# Patient Record
Sex: Male | Born: 1940 | Race: White | Hispanic: No | Marital: Married | Smoking: Never smoker
Health system: Southern US, Community
[De-identification: ages and names within clinical notes are randomized; demographics above are authoritative.]

## PROBLEM LIST (undated history)

## (undated) DIAGNOSIS — K429 Umbilical hernia without obstruction or gangrene: Secondary | ICD-10-CM

## (undated) DIAGNOSIS — G4733 Obstructive sleep apnea (adult) (pediatric): Secondary | ICD-10-CM

## (undated) DIAGNOSIS — D7589 Other specified diseases of blood and blood-forming organs: Secondary | ICD-10-CM

## (undated) DIAGNOSIS — K219 Gastro-esophageal reflux disease without esophagitis: Secondary | ICD-10-CM

## (undated) DIAGNOSIS — K56600 Partial intestinal obstruction, unspecified as to cause: Secondary | ICD-10-CM

## (undated) DIAGNOSIS — G4730 Sleep apnea, unspecified: Secondary | ICD-10-CM

## (undated) DIAGNOSIS — K449 Diaphragmatic hernia without obstruction or gangrene: Secondary | ICD-10-CM

## (undated) DIAGNOSIS — I2510 Atherosclerotic heart disease of native coronary artery without angina pectoris: Secondary | ICD-10-CM

## (undated) DIAGNOSIS — K760 Fatty (change of) liver, not elsewhere classified: Secondary | ICD-10-CM

## (undated) DIAGNOSIS — Z9989 Dependence on other enabling machines and devices: Secondary | ICD-10-CM

## (undated) DIAGNOSIS — E7800 Pure hypercholesterolemia, unspecified: Secondary | ICD-10-CM

## (undated) DIAGNOSIS — K8020 Calculus of gallbladder without cholecystitis without obstruction: Secondary | ICD-10-CM

## (undated) DIAGNOSIS — C4490 Unspecified malignant neoplasm of skin, unspecified: Secondary | ICD-10-CM

## (undated) DIAGNOSIS — I10 Essential (primary) hypertension: Secondary | ICD-10-CM

## (undated) DIAGNOSIS — J309 Allergic rhinitis, unspecified: Secondary | ICD-10-CM

## (undated) DIAGNOSIS — N401 Enlarged prostate with lower urinary tract symptoms: Secondary | ICD-10-CM

## (undated) HISTORY — PX: EYE SURGERY: SHX253

## (undated) HISTORY — DX: Obstructive sleep apnea (adult) (pediatric): G47.33

## (undated) HISTORY — PX: OTHER SURGICAL HISTORY: SHX169

## (undated) HISTORY — DX: Benign prostatic hyperplasia with lower urinary tract symptoms: N40.1

## (undated) HISTORY — DX: Dependence on other enabling machines and devices: Z99.89

## (undated) HISTORY — PX: ROTATOR CUFF REPAIR: SHX139

## (undated) HISTORY — DX: Fatty (change of) liver, not elsewhere classified: K76.0

## (undated) HISTORY — DX: Essential (primary) hypertension: I10

## (undated) HISTORY — DX: Other specified diseases of blood and blood-forming organs: D75.89

## (undated) HISTORY — DX: Partial intestinal obstruction, unspecified as to cause: K56.600

## (undated) HISTORY — DX: Diaphragmatic hernia without obstruction or gangrene: K44.9

## (undated) HISTORY — DX: Atherosclerotic heart disease of native coronary artery without angina pectoris: I25.10

## (undated) HISTORY — DX: Umbilical hernia without obstruction or gangrene: K42.9

## (undated) HISTORY — DX: Sleep apnea, unspecified: G47.30

## (undated) HISTORY — DX: Calculus of gallbladder without cholecystitis without obstruction: K80.20

## (undated) HISTORY — PX: TONSILLECTOMY: SUR1361

## (undated) HISTORY — DX: Gastro-esophageal reflux disease without esophagitis: K21.9

## (undated) HISTORY — DX: Allergic rhinitis, unspecified: J30.9

## (undated) HISTORY — DX: Unspecified malignant neoplasm of skin, unspecified: C44.90

---

## 1998-01-19 ENCOUNTER — Ambulatory Visit (HOSPITAL_COMMUNITY): Admission: RE | Admit: 1998-01-19 | Discharge: 1998-01-19 | Payer: Self-pay | Admitting: Family Medicine

## 1998-03-02 ENCOUNTER — Ambulatory Visit (HOSPITAL_COMMUNITY): Admission: RE | Admit: 1998-03-02 | Discharge: 1998-03-02 | Payer: Self-pay | Admitting: Family Medicine

## 1998-08-14 HISTORY — PX: CARDIAC CATHETERIZATION: SHX172

## 1999-10-05 ENCOUNTER — Encounter: Payer: Self-pay | Admitting: Orthopaedic Surgery

## 1999-10-05 ENCOUNTER — Encounter: Admission: RE | Admit: 1999-10-05 | Discharge: 1999-10-05 | Payer: Self-pay | Admitting: Orthopaedic Surgery

## 1999-12-19 ENCOUNTER — Encounter: Payer: Self-pay | Admitting: Family Medicine

## 1999-12-19 ENCOUNTER — Encounter: Admission: RE | Admit: 1999-12-19 | Discharge: 1999-12-19 | Payer: Self-pay | Admitting: Family Medicine

## 2004-02-09 ENCOUNTER — Encounter: Admission: RE | Admit: 2004-02-09 | Discharge: 2004-02-09 | Payer: Self-pay | Admitting: Family Medicine

## 2004-02-09 IMAGING — CR DG CHEST 2V
2 series · 2 of 2 positions shown · non-contrast
Comparison: none

CLINICAL DATA: Cough and congestion.
 CHEST ? TWO VIEWS
 The heart size and mediastinal contours are normal. The lungs are clear. The visualized skeleton is unremarkable.

 There is no interval change since [DATE].
 IMPRESSION
 Since [DATE], no interval change ? no active disease.

[view not recorded (1 of 2)]
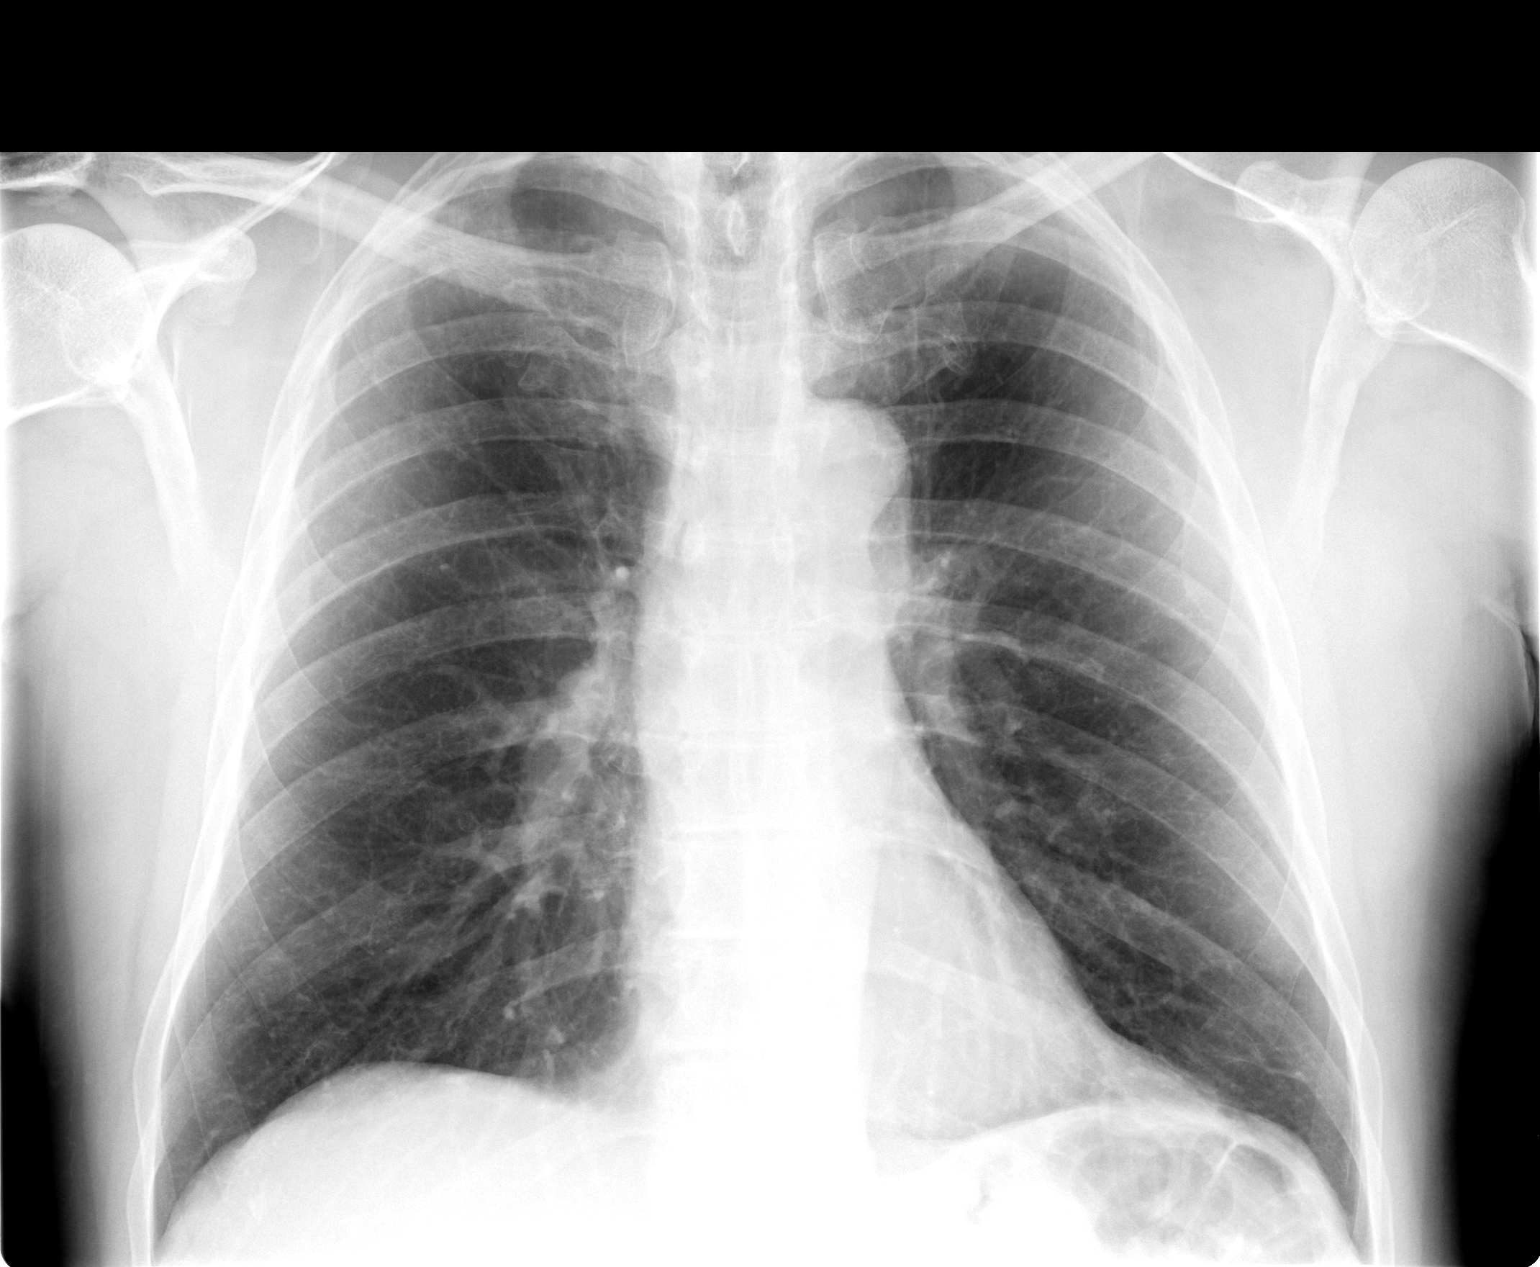

[view not recorded (2 of 2)]
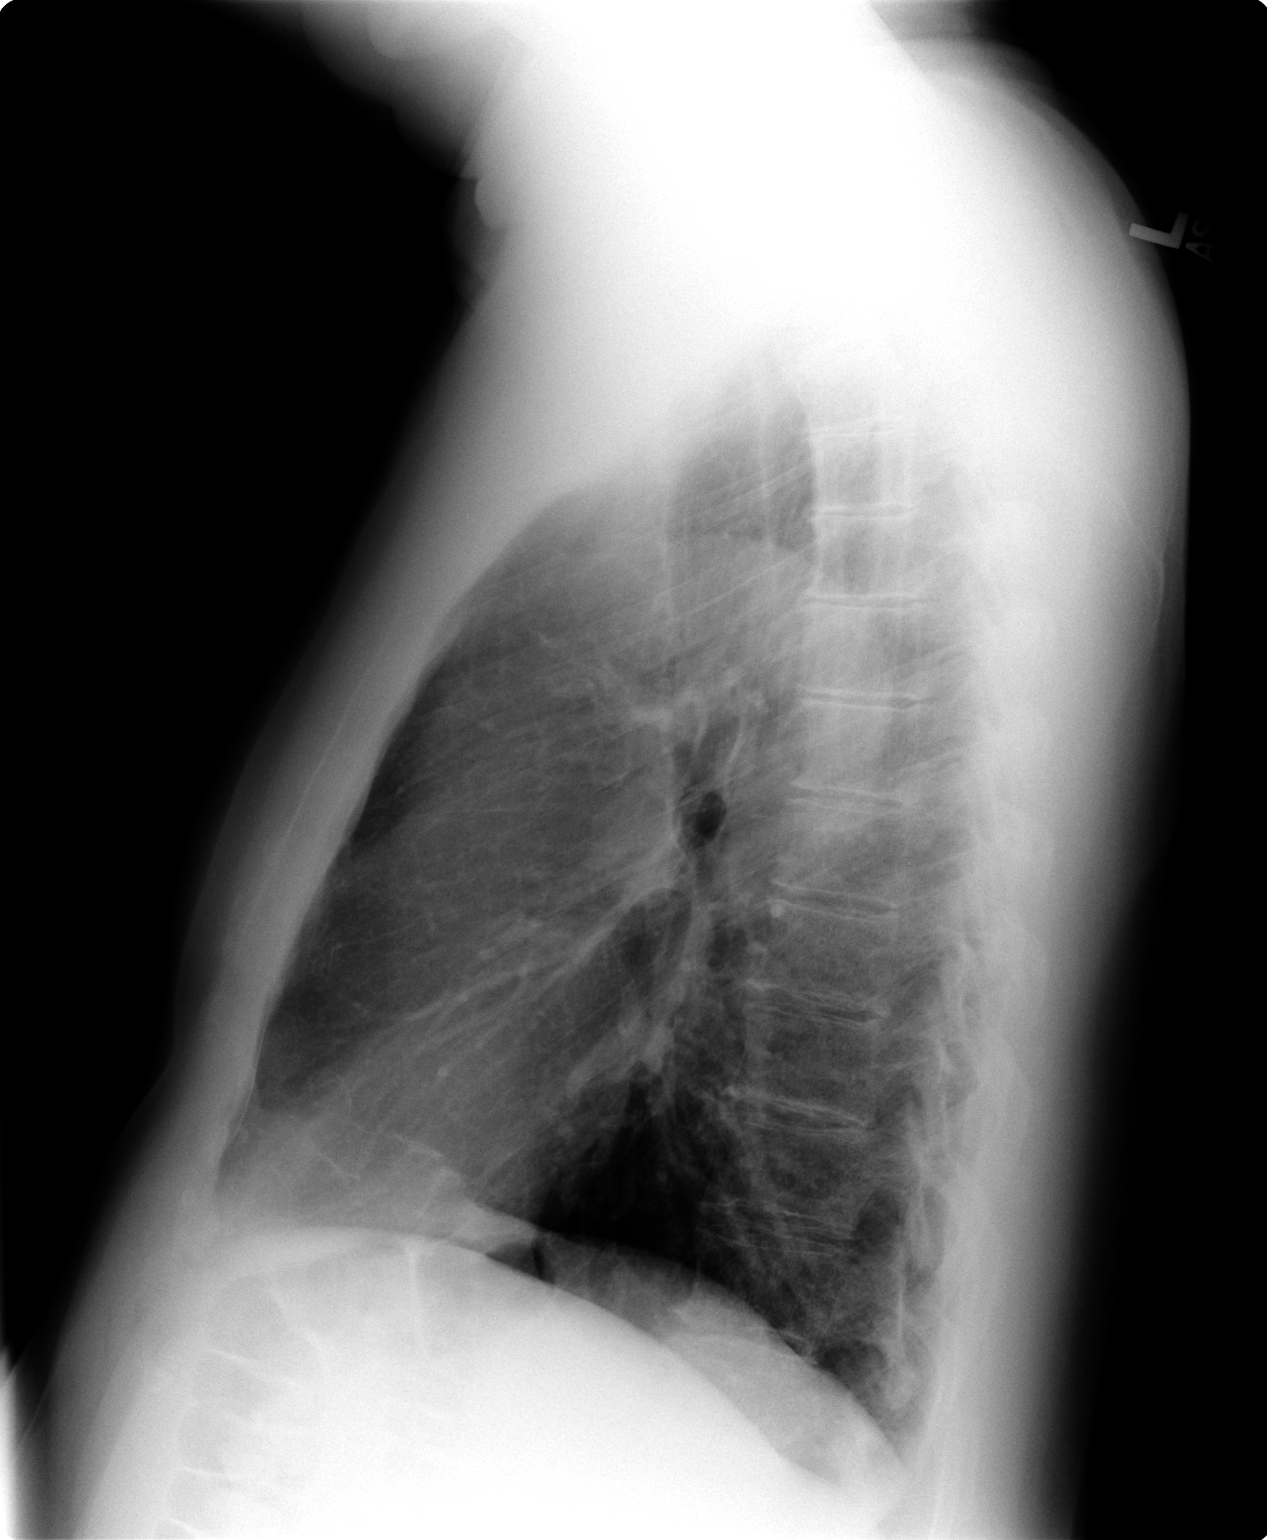

[2 of 2 positions shown; findings below may reference images not displayed]

## 2005-11-27 ENCOUNTER — Encounter: Admission: RE | Admit: 2005-11-27 | Discharge: 2005-11-27 | Payer: Self-pay | Admitting: Family Medicine

## 2005-11-27 IMAGING — CR DG CHEST 2V
2 series · 2 of 2 positions shown · non-contrast
Comparison: [DATE].

CLINICAL DATA: Cough.
 TWO VIEW CHEST:

[w chest pa]
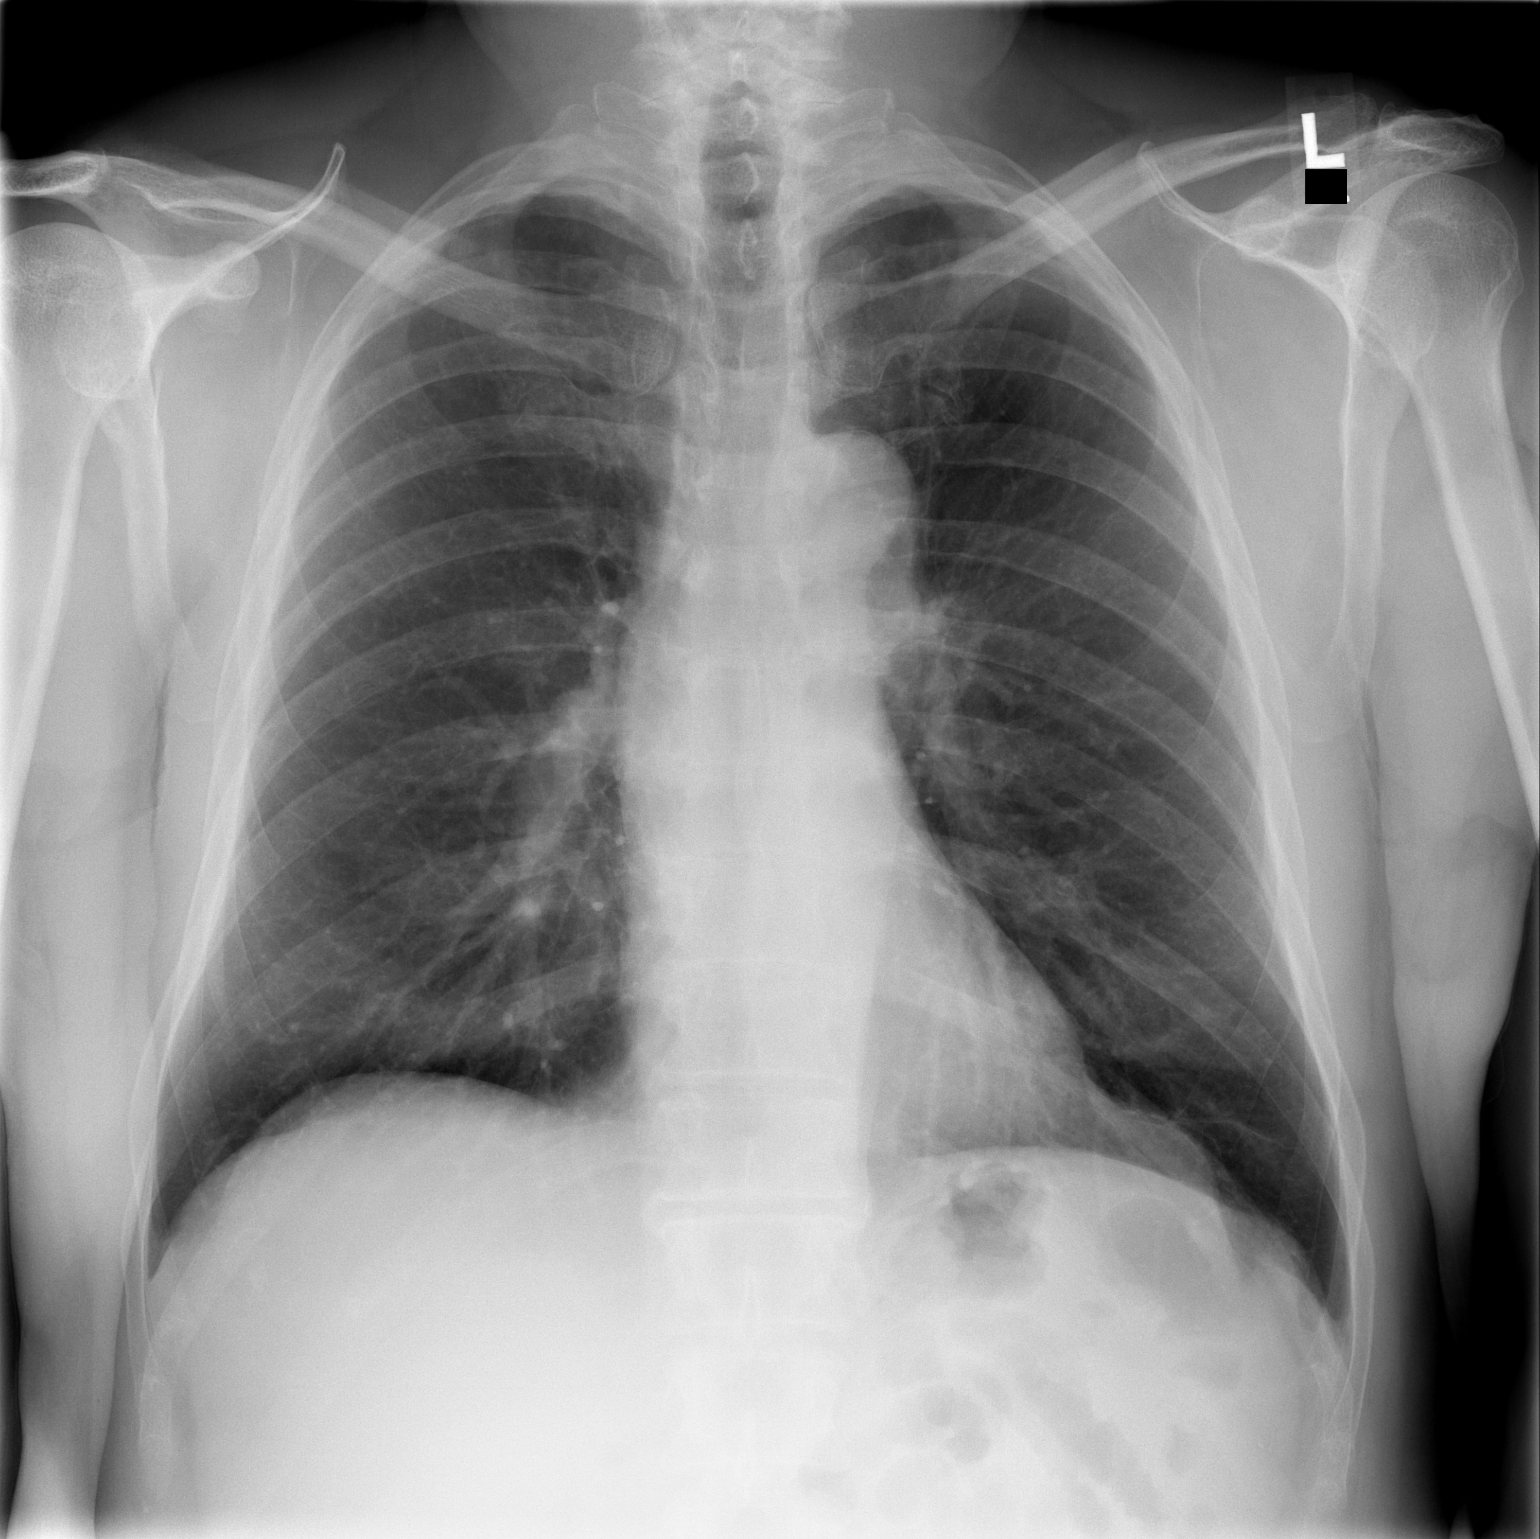

[w chest lat]
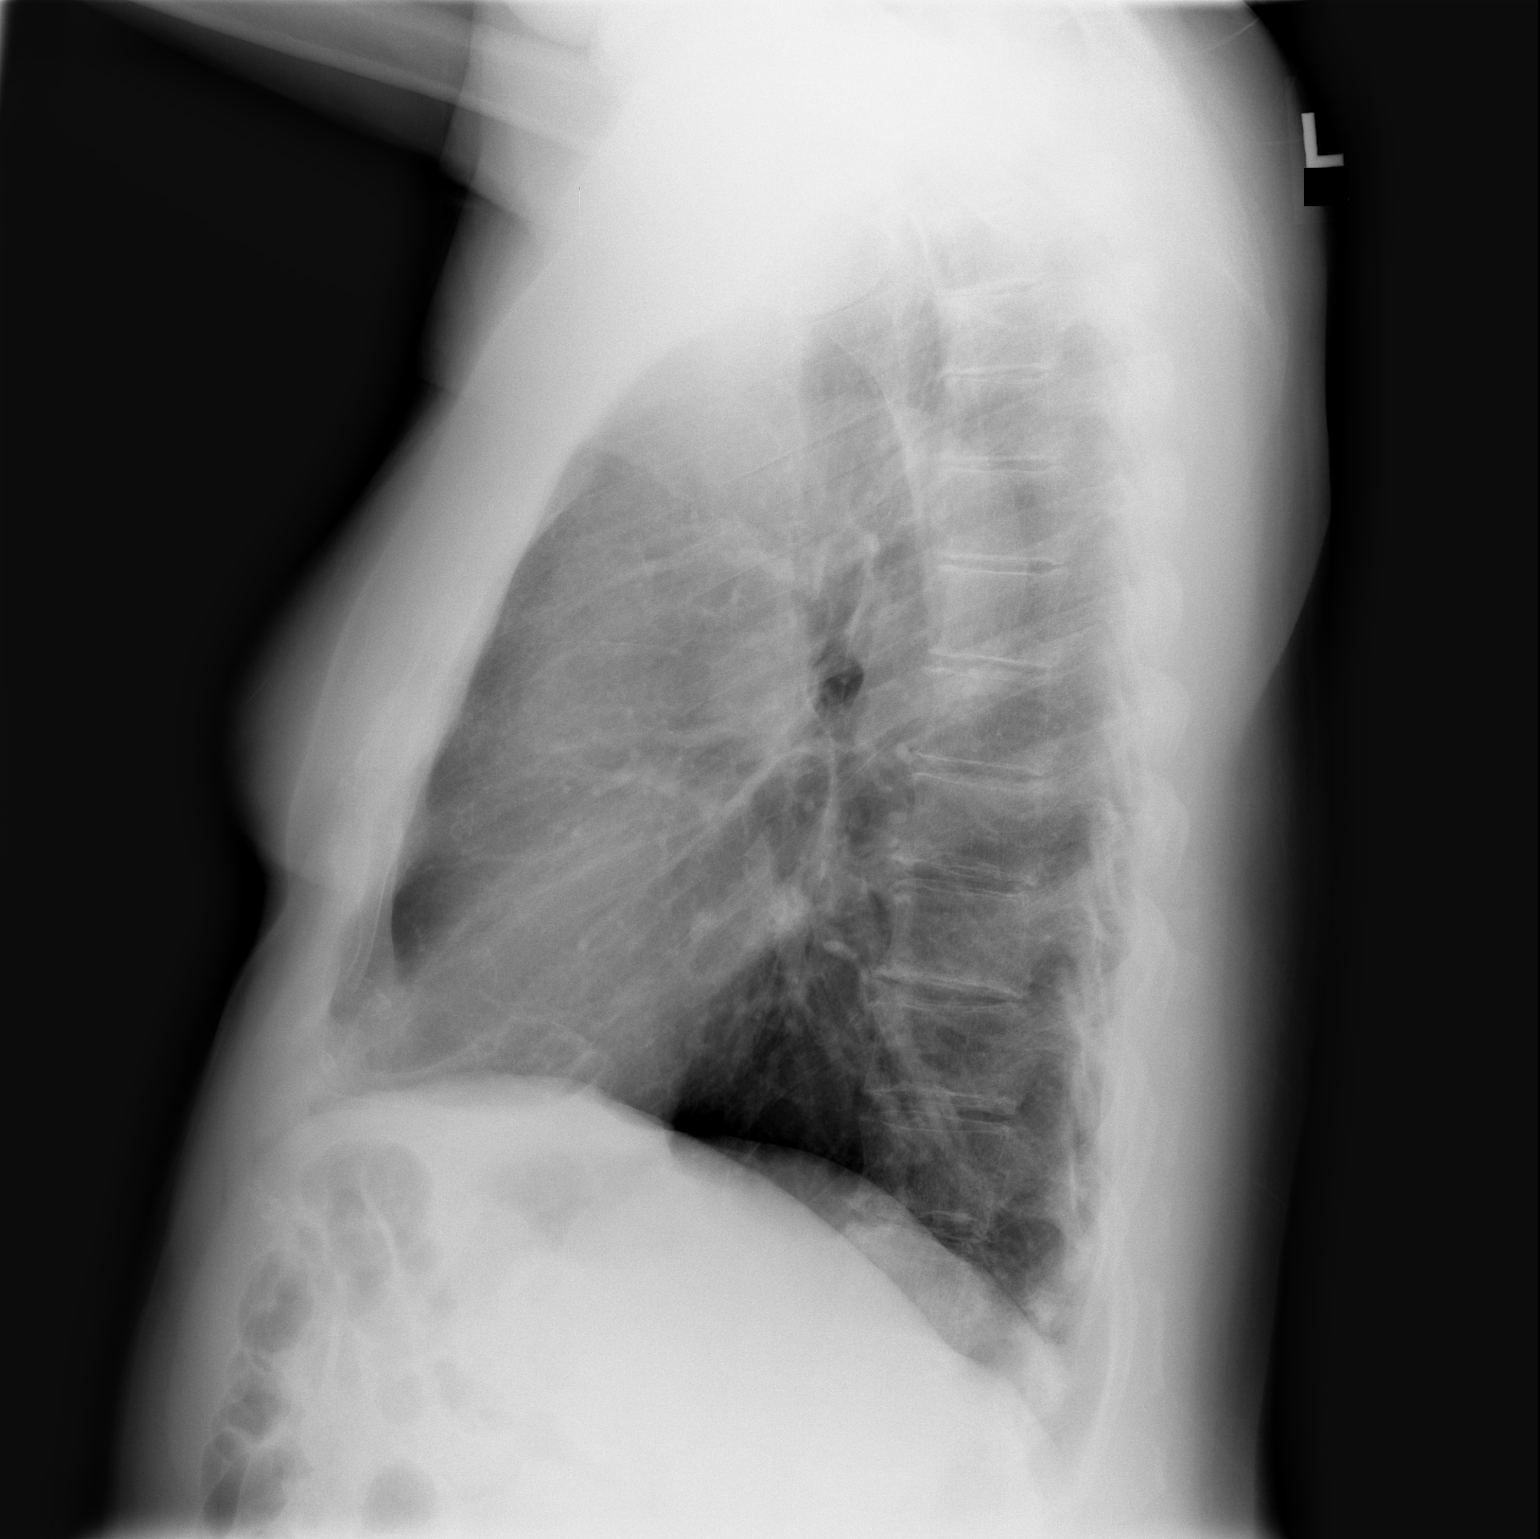

[2 of 2 positions shown; findings below may reference images not displayed]

There are no infiltrative or edematous changes, and the heart and mediastinal structures are normal.  There are mildly accentuated bronchial markings.
IMPRESSION: Mild chronic bronchitic changes.  No evidence of active chest disease radiographically.

## 2006-08-14 HISTORY — PX: CARDIAC CATHETERIZATION: SHX172

## 2007-08-15 DIAGNOSIS — C4490 Unspecified malignant neoplasm of skin, unspecified: Secondary | ICD-10-CM

## 2007-08-15 HISTORY — DX: Unspecified malignant neoplasm of skin, unspecified: C44.90

## 2010-06-07 ENCOUNTER — Emergency Department (HOSPITAL_COMMUNITY): Admission: EM | Admit: 2010-06-07 | Discharge: 2010-06-07 | Payer: Self-pay | Admitting: Emergency Medicine

## 2011-03-15 ENCOUNTER — Encounter: Payer: Self-pay | Admitting: Podiatry

## 2011-03-15 DIAGNOSIS — I10 Essential (primary) hypertension: Secondary | ICD-10-CM | POA: Insufficient documentation

## 2011-03-15 DIAGNOSIS — K219 Gastro-esophageal reflux disease without esophagitis: Secondary | ICD-10-CM | POA: Insufficient documentation

## 2011-03-15 DIAGNOSIS — L03032 Cellulitis of left toe: Secondary | ICD-10-CM | POA: Insufficient documentation

## 2013-08-12 ENCOUNTER — Emergency Department (HOSPITAL_COMMUNITY): Payer: Medicare Other

## 2013-08-12 ENCOUNTER — Observation Stay (HOSPITAL_COMMUNITY)
Admission: EM | Admit: 2013-08-12 | Discharge: 2013-08-13 | Disposition: A | Discharge status: Home / Self Care | Payer: Medicare Other | Attending: Internal Medicine | Admitting: Internal Medicine

## 2013-08-12 ENCOUNTER — Encounter (HOSPITAL_COMMUNITY): Payer: Self-pay | Admitting: Emergency Medicine

## 2013-08-12 DIAGNOSIS — R109 Unspecified abdominal pain: Secondary | ICD-10-CM | POA: Insufficient documentation

## 2013-08-12 DIAGNOSIS — I10 Essential (primary) hypertension: Secondary | ICD-10-CM | POA: Diagnosis present

## 2013-08-12 DIAGNOSIS — R42 Dizziness and giddiness: Secondary | ICD-10-CM | POA: Insufficient documentation

## 2013-08-12 DIAGNOSIS — K219 Gastro-esophageal reflux disease without esophagitis: Secondary | ICD-10-CM | POA: Diagnosis present

## 2013-08-12 DIAGNOSIS — M549 Dorsalgia, unspecified: Secondary | ICD-10-CM | POA: Diagnosis present

## 2013-08-12 DIAGNOSIS — Z888 Allergy status to other drugs, medicaments and biological substances status: Secondary | ICD-10-CM | POA: Insufficient documentation

## 2013-08-12 DIAGNOSIS — M545 Low back pain, unspecified: Secondary | ICD-10-CM | POA: Insufficient documentation

## 2013-08-12 DIAGNOSIS — E7800 Pure hypercholesterolemia, unspecified: Secondary | ICD-10-CM | POA: Insufficient documentation

## 2013-08-12 DIAGNOSIS — I2510 Atherosclerotic heart disease of native coronary artery without angina pectoris: Secondary | ICD-10-CM | POA: Diagnosis present

## 2013-08-12 DIAGNOSIS — Z95818 Presence of other cardiac implants and grafts: Secondary | ICD-10-CM | POA: Insufficient documentation

## 2013-08-12 DIAGNOSIS — R55 Syncope and collapse: Principal | ICD-10-CM | POA: Diagnosis present

## 2013-08-12 HISTORY — DX: Pure hypercholesterolemia, unspecified: E78.00

## 2013-08-12 LAB — CBC WITH DIFFERENTIAL/PLATELET
Basophils Absolute: 0 10*3/uL (ref 0.0–0.1)
Basophils Relative: 0 % (ref 0–1)
Eosinophils Absolute: 0.1 10*3/uL (ref 0.0–0.7)
Hemoglobin: 14.1 g/dL (ref 13.0–17.0)
Lymphocytes Relative: 16 % (ref 12–46)
MCH: 34 pg (ref 26.0–34.0)
MCHC: 34.4 g/dL (ref 30.0–36.0)
Monocytes Absolute: 0.8 10*3/uL (ref 0.1–1.0)
Monocytes Relative: 10 % (ref 3–12)
Neutro Abs: 6 10*3/uL (ref 1.7–7.7)
Neutrophils Relative %: 72 % (ref 43–77)
Platelets: 207 10*3/uL (ref 150–400)
RBC: 4.15 MIL/uL — ABNORMAL LOW (ref 4.22–5.81)
RDW: 13.4 % (ref 11.5–15.5)

## 2013-08-12 LAB — COMPREHENSIVE METABOLIC PANEL
ALT: 30 U/L (ref 0–53)
AST: 24 U/L (ref 0–37)
Alkaline Phosphatase: 71 U/L (ref 39–117)
BUN: 16 mg/dL (ref 6–23)
CO2: 24 mEq/L (ref 19–32)
Chloride: 102 mEq/L (ref 96–112)
GFR calc Af Amer: >90 mL/min (ref >90)
GFR calc non Af Amer: 85 mL/min — ABNORMAL LOW (ref >90)
Glucose, Bld: 102 mg/dL — ABNORMAL HIGH (ref 70–99)
Potassium: 4.1 mEq/L (ref 3.7–5.3)
Sodium: 139 mEq/L (ref 137–147)
Total Bilirubin: 0.5 mg/dL (ref 0.3–1.2)

## 2013-08-12 LAB — POCT I-STAT, CHEM 8
BUN: 17 mg/dL (ref 6–23)
Calcium, Ion: 1.17 mmol/L (ref 1.13–1.30)
Glucose, Bld: 105 mg/dL — ABNORMAL HIGH (ref 70–99)
HCT: 43 % (ref 39.0–52.0)
Hemoglobin: 14.6 g/dL (ref 13.0–17.0)
Potassium: 3.9 mEq/L (ref 3.7–5.3)

## 2013-08-12 LAB — LIPASE, BLOOD: Lipase: 14 U/L (ref 11–59)

## 2013-08-12 LAB — CG4 I-STAT (LACTIC ACID): Lactic Acid, Venous: 1.88 mmol/L (ref 0.5–2.2)

## 2013-08-12 LAB — TROPONIN I: Troponin I: <0.3 ng/mL (ref <0.30)

## 2013-08-12 LAB — POCT I-STAT TROPONIN I

## 2013-08-12 IMAGING — CT CT HEAD W/O CM
1 series · 16 of 30 positions shown, 20 images · non-contrast
Comparison: None.

CLINICAL DATA: Chest pain.  Loss of vision.  Possible syncope.

EXAM:
CT HEAD WITHOUT CONTRAST
TECHNIQUE: Contiguous axial images were obtained from the base of the skull
through the vertex without intravenous contrast.

[Series 2: head 5.0 h30s · axial · 0.45mm/px · z∈[-157,-12]mm · 16 of 33 slices shown, 20 images]
[im 2/33  brain]
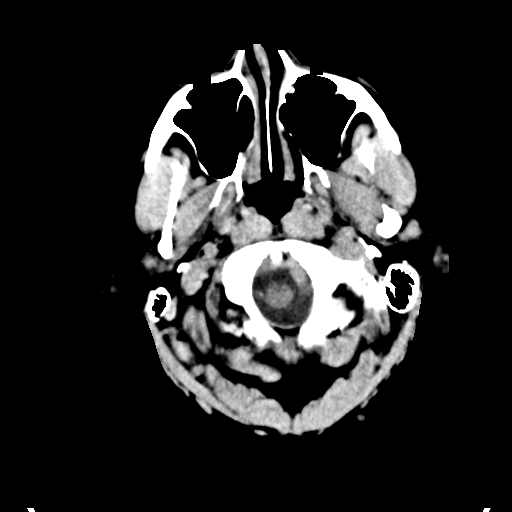
[im 2/33  bone]
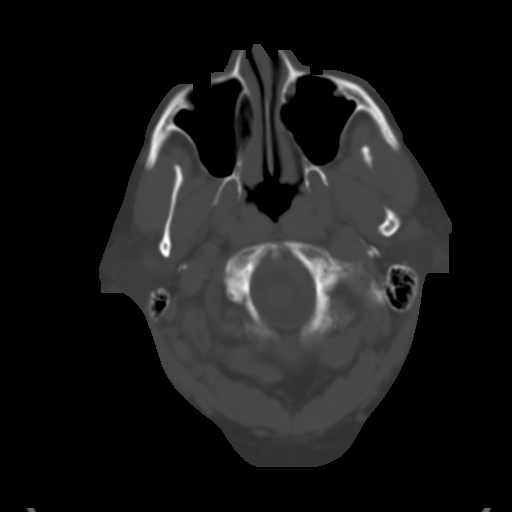
[im 4/33  brain]
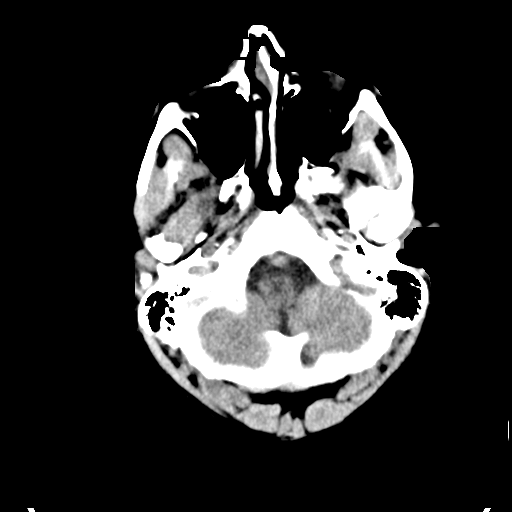
[im 6/33  brain]
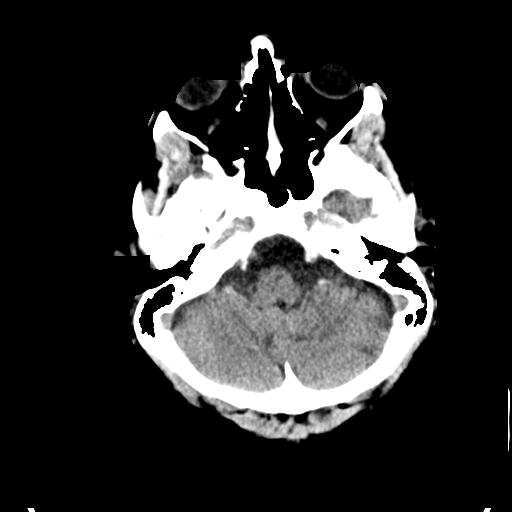
[im 8/33  brain]
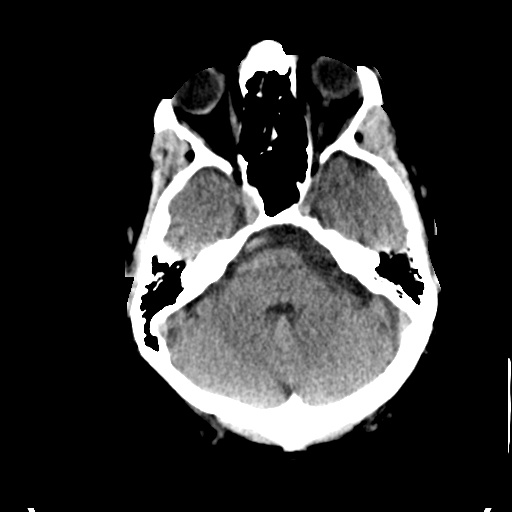
[im 9/33  brain]
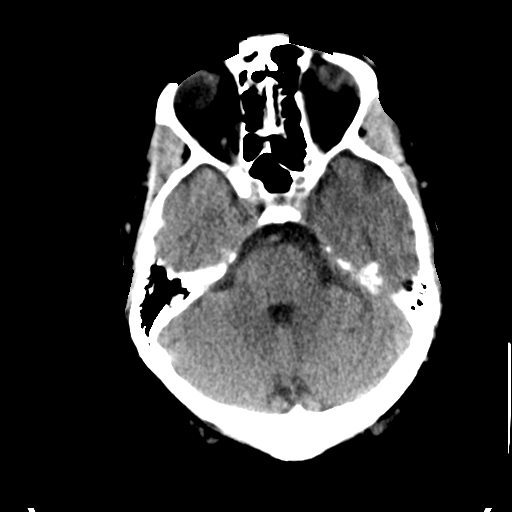
[im 9/33  bone]
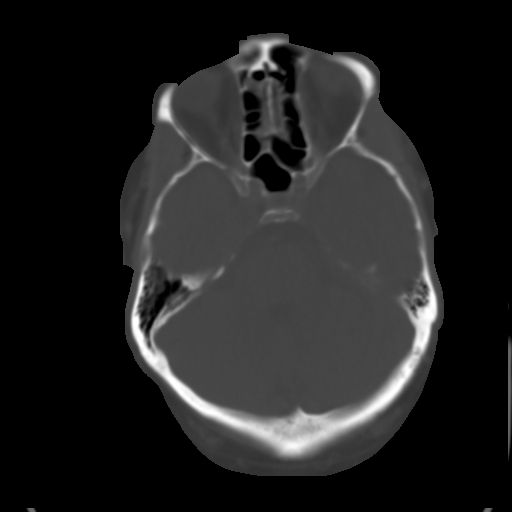
[im 12/33  brain]
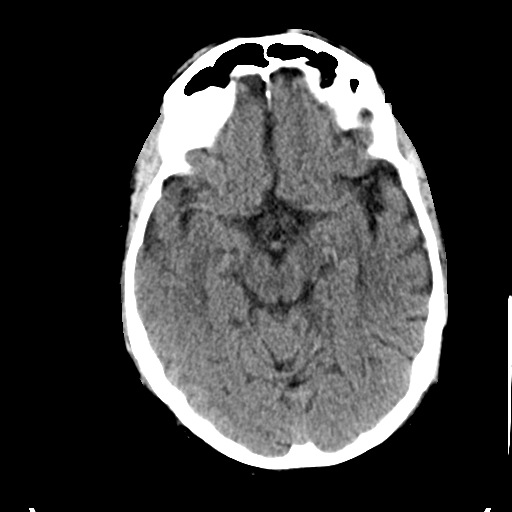
[im 14/33  brain]
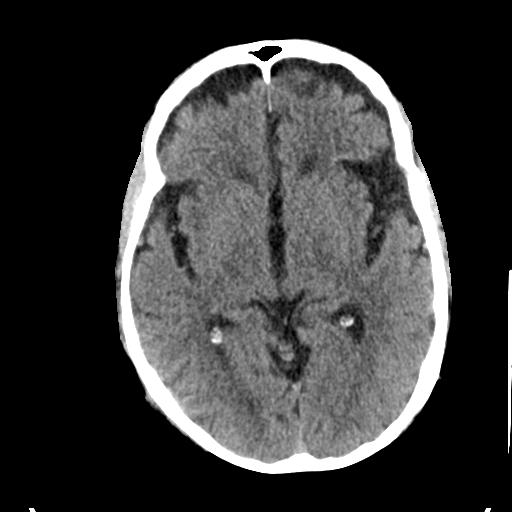
[im 16/33  brain]
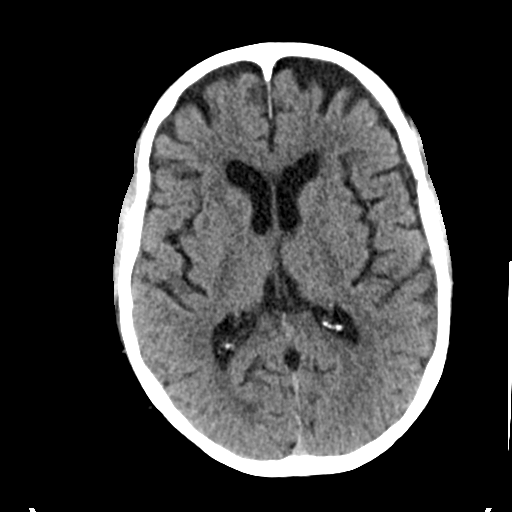
[im 17/33  brain]
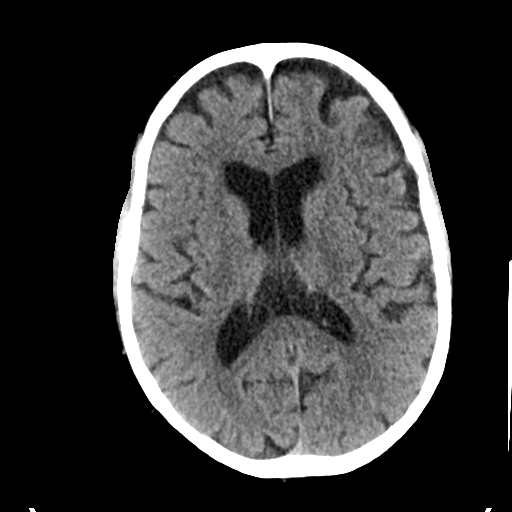
[im 17/33  bone]
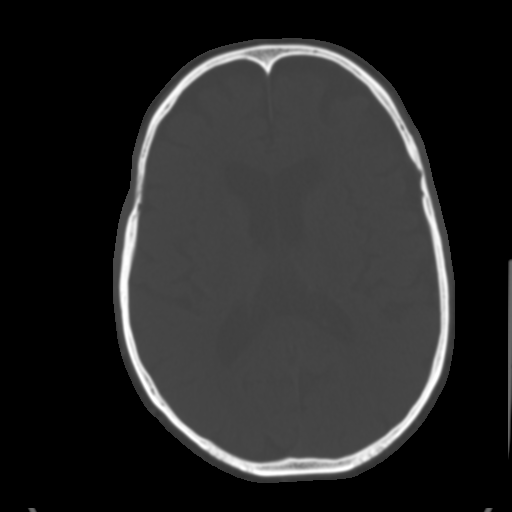
[im 19/33  brain]
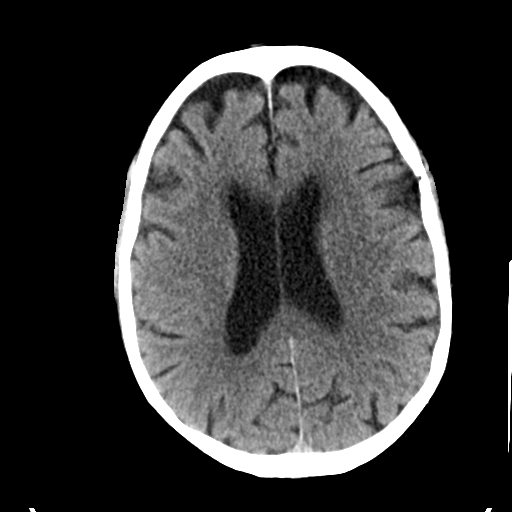
[im 21/33  brain]
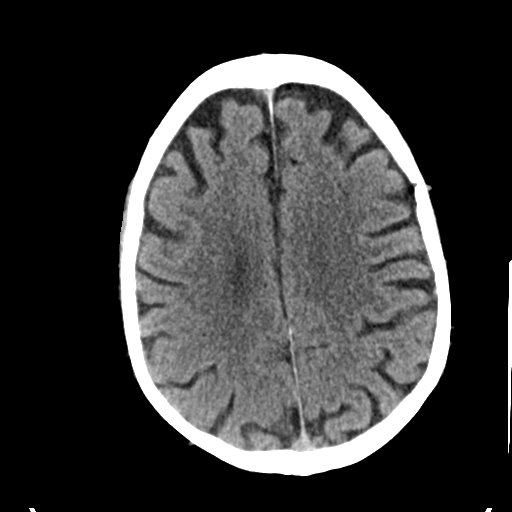
[im 24/33  brain]
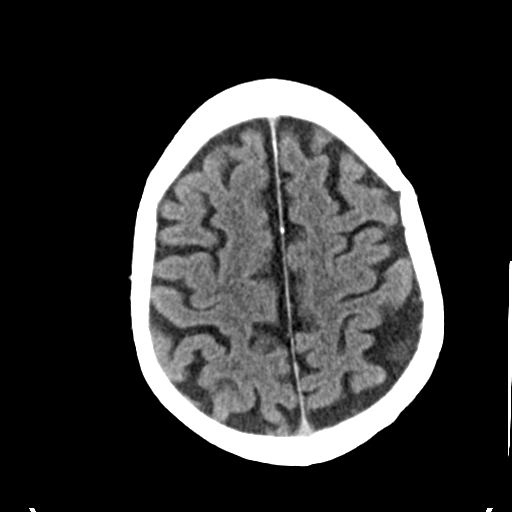
[im 25/33  brain]
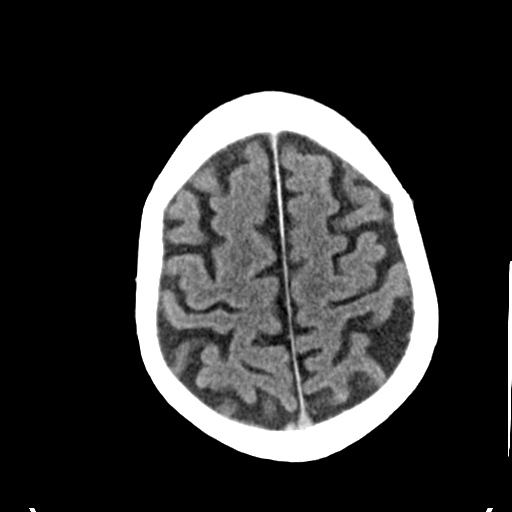
[im 25/33  bone]
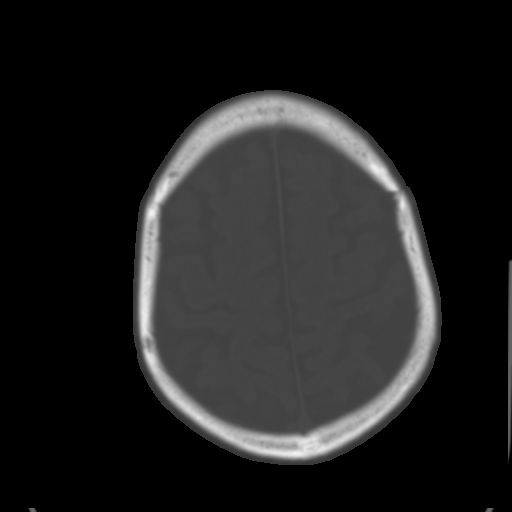
[im 27/33  brain]
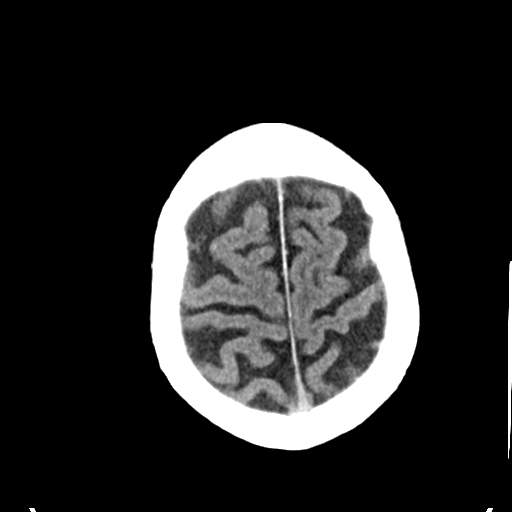
[im 29/33  brain]
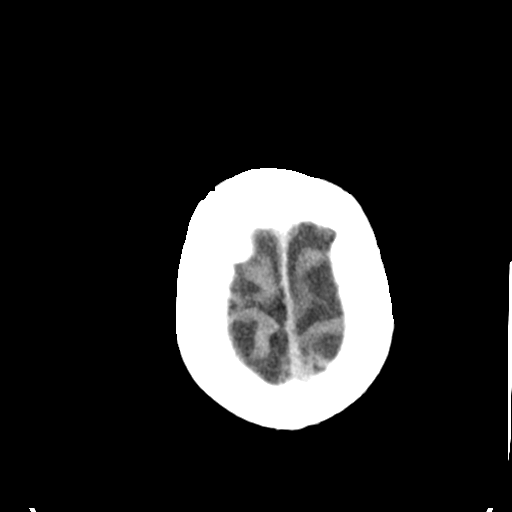
[im 31/33  brain]
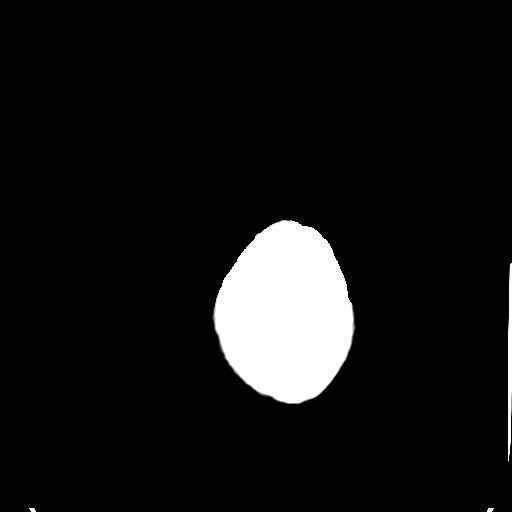

[16 of 30 positions shown; findings below may reference images not displayed]

FINDINGS: Mild cerebral atrophy compatible with patient's age. No acute
intracranial abnormality. Specifically, no hemorrhage,
hydrocephalus, mass lesion, acute infarction, or significant
intracranial injury. No acute calvarial abnormality. Visualized
paranasal sinuses and mastoids clear. Orbital soft tissues
unremarkable.
IMPRESSION: No acute intracranial abnormality.

## 2013-08-12 IMAGING — CR DG CHEST 2V
2 series · 2 of 2 positions shown · non-contrast
Comparison: DG CHEST 2 VIEW dated [DATE]

CLINICAL DATA: Fall, syncope.  High blood pressure

EXAM:
CHEST  2 VIEW

[w chest pa]
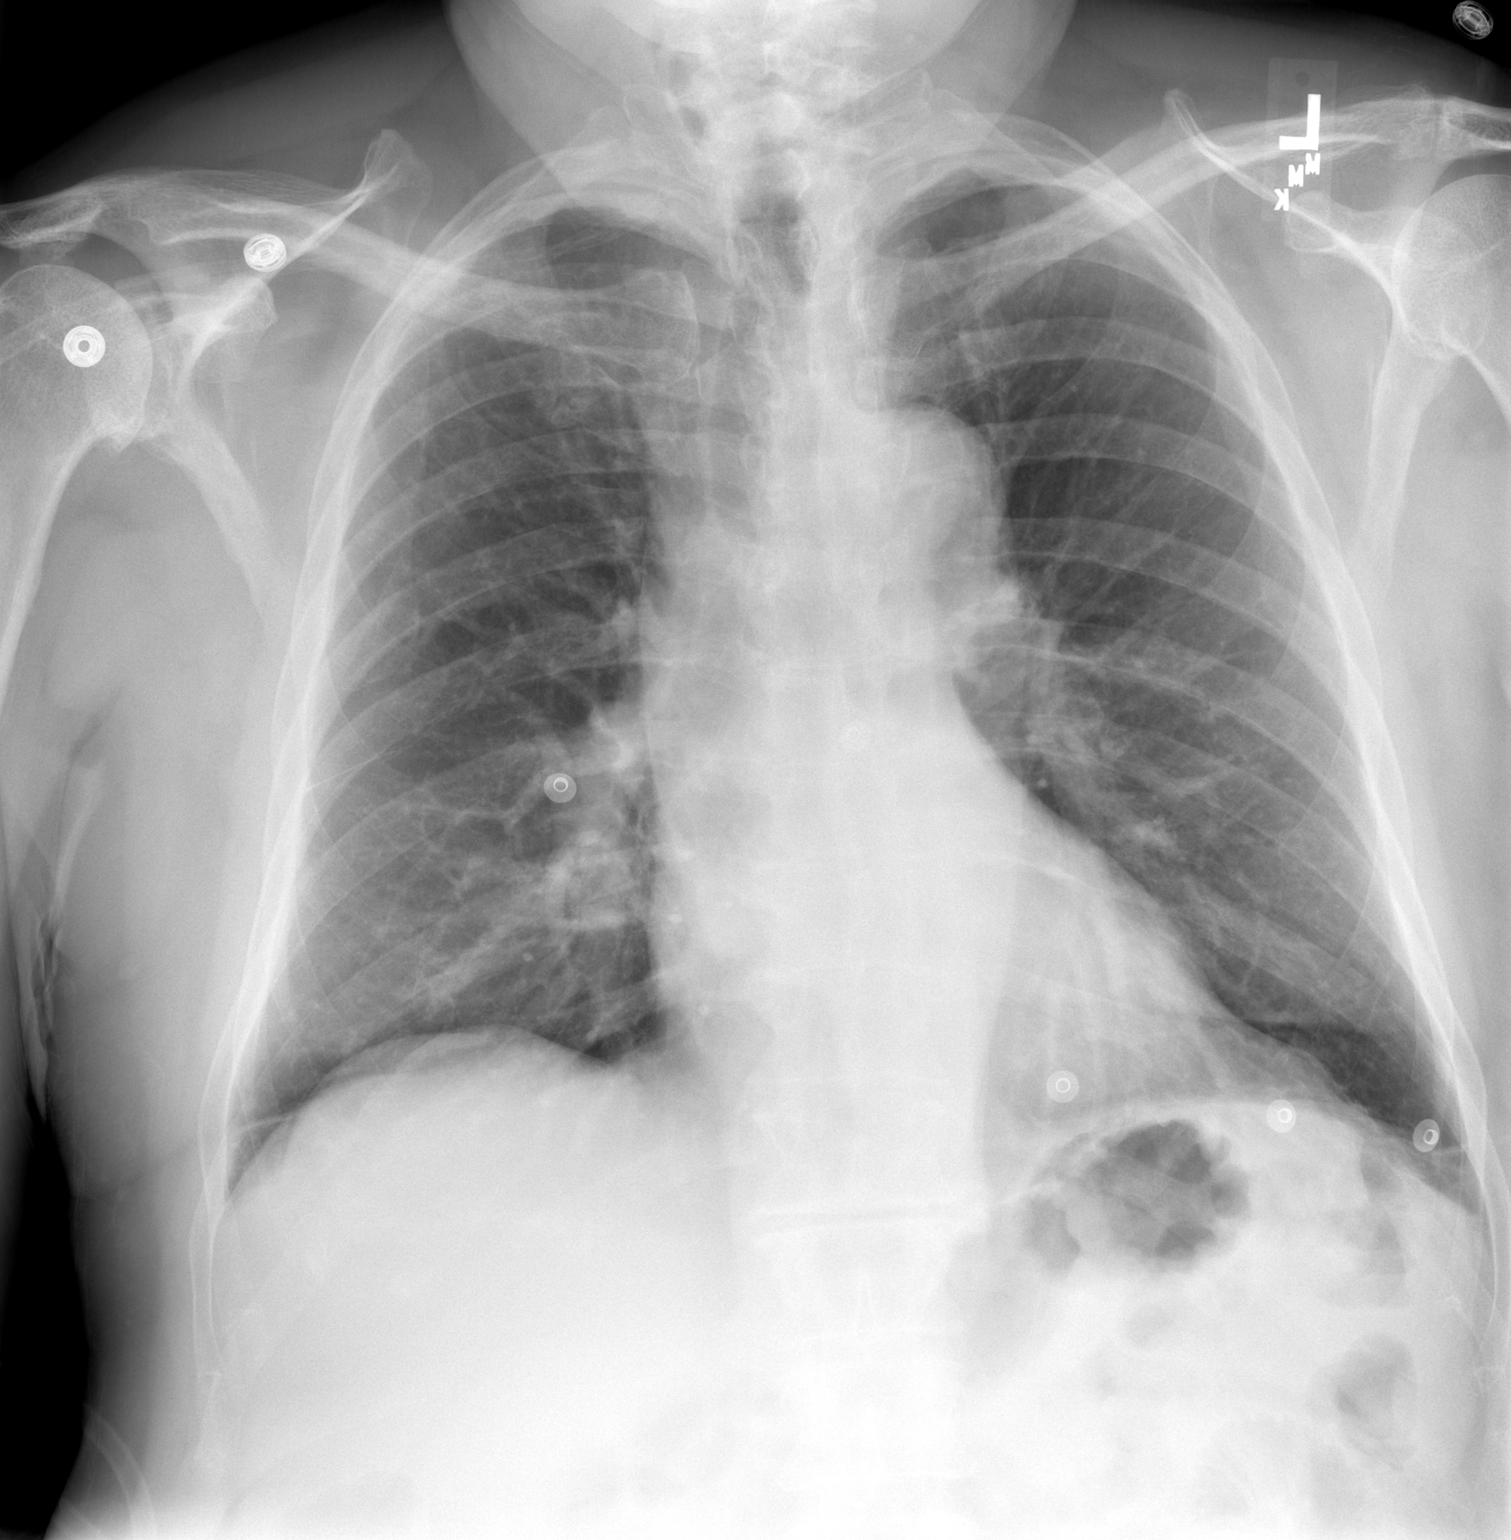

[w chest lat]
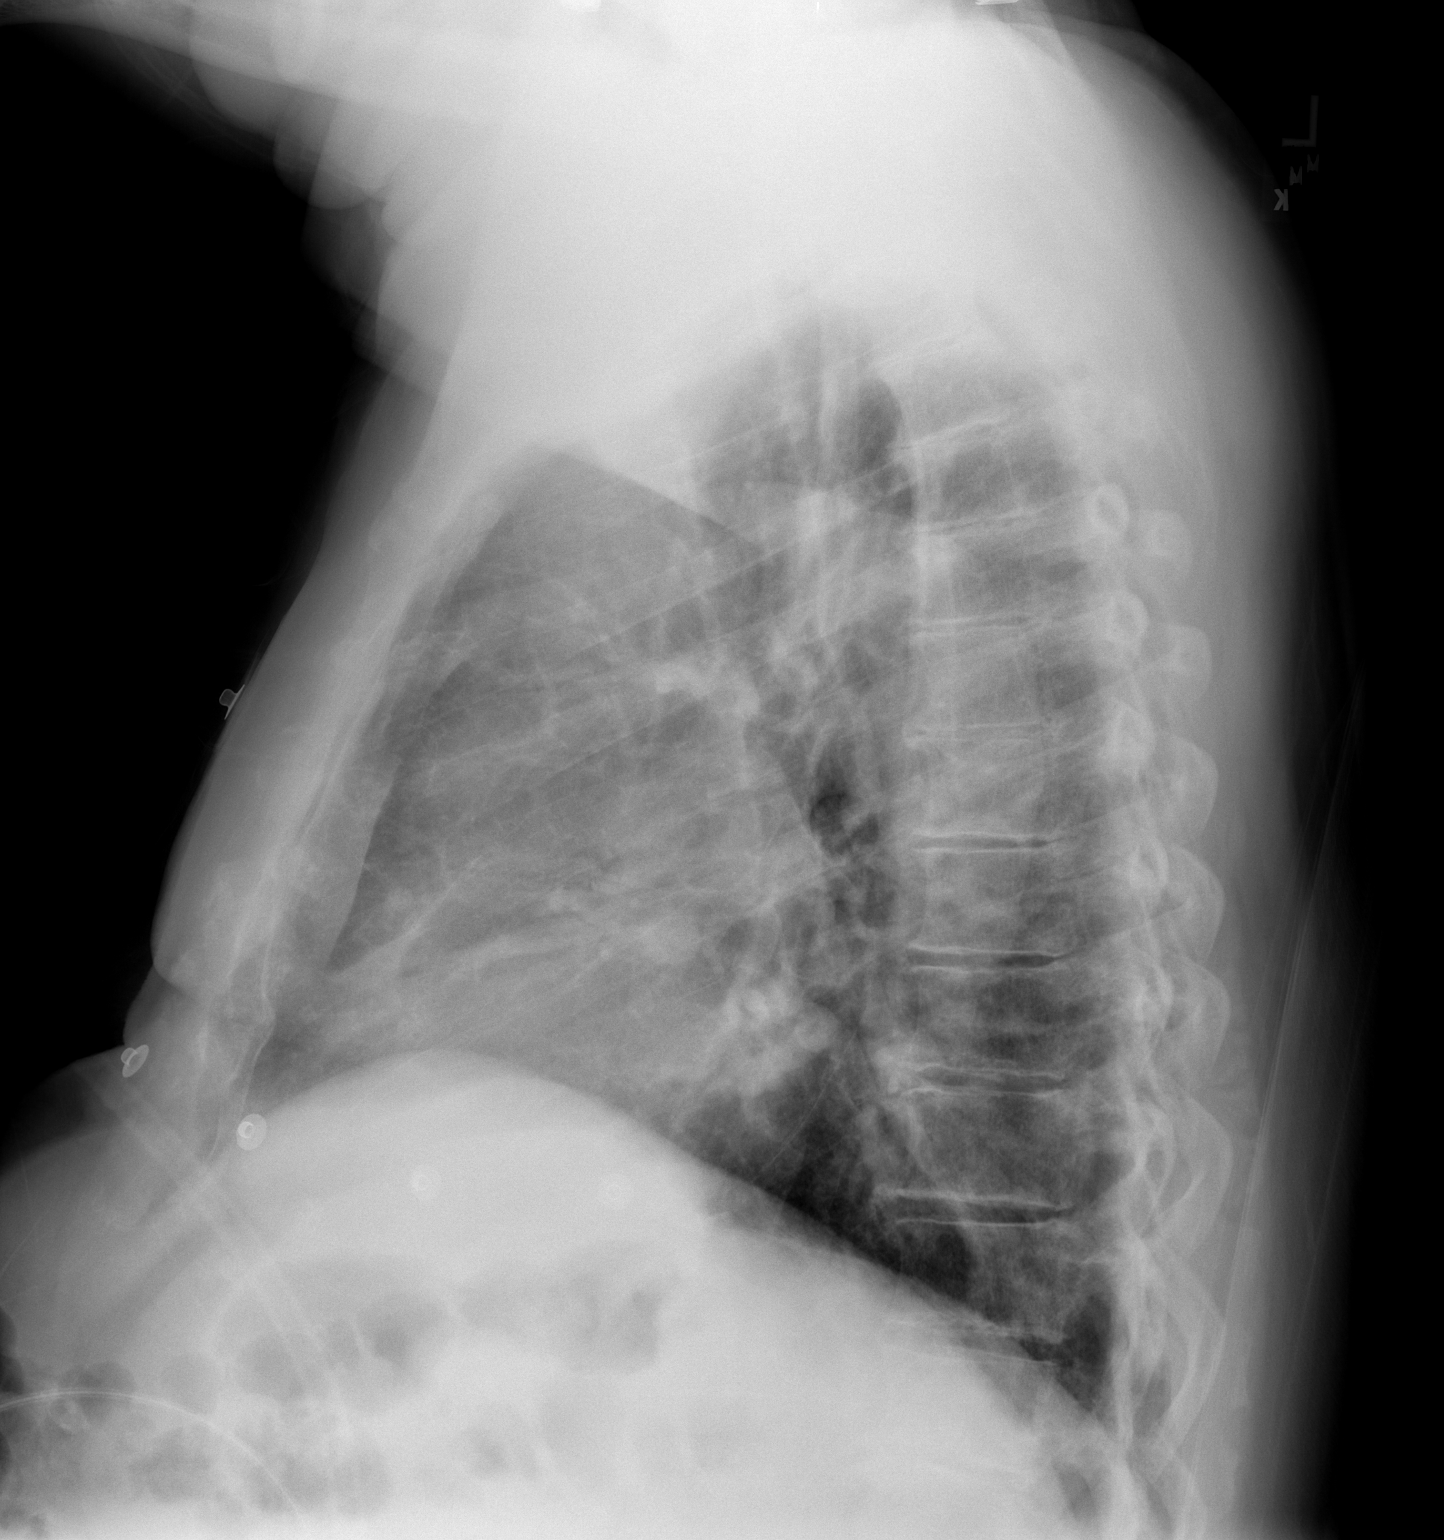

[2 of 2 positions shown; findings below may reference images not displayed]

FINDINGS: Normal cardiac silhouette. There is mild basilar atelectasis. No
effusion, infiltrate, or pneumothorax. Degenerative osteophytosis of
the thoracic spine.
IMPRESSION: Mild basilar atelectasis.  Otherwise no acute findings.

## 2013-08-12 IMAGING — CT CT CTA ABD/PEL W/CM AND/OR W/O CM
2 of 9 series · 15 of 46 positions shown, 17 images · IV contrast (APPLIED)
Comparison: None.

CLINICAL DATA: Dizziness, hypertension. Chest pain, concern for
aortic dissection

EXAM:
CT ANGIOGRAPHY CHEST, ABDOMEN AND PELVIS
TECHNIQUE: Multidetector CT imaging through the chest, abdomen and pelvis was
performed using the standard protocol during bolus administration of
intravenous contrast. Multiplanar reconstructed images including
MIPs were obtained and reviewed to evaluate the vascular anatomy.
CONTRAST:  100mL OMNIPAQUE IOHEXOL 350 MG/ML SOLN

[Series 4: dissection 2.0 i30f 3 · axial · 0.84mm/px · z∈[-669,-89]mm · 12 of 330 slices shown, 14 images]
[im 20/330  soft-tissue]
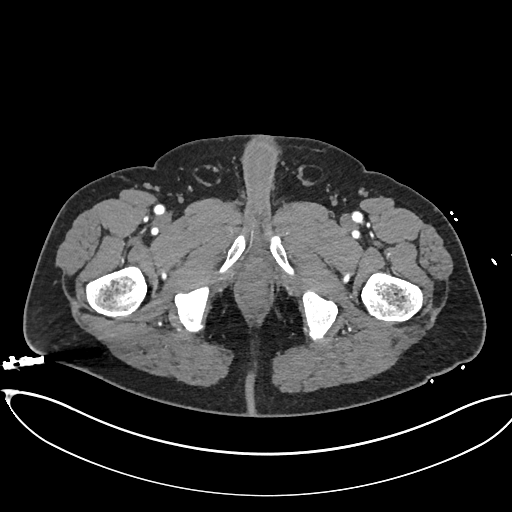
[im 20/330  bone]
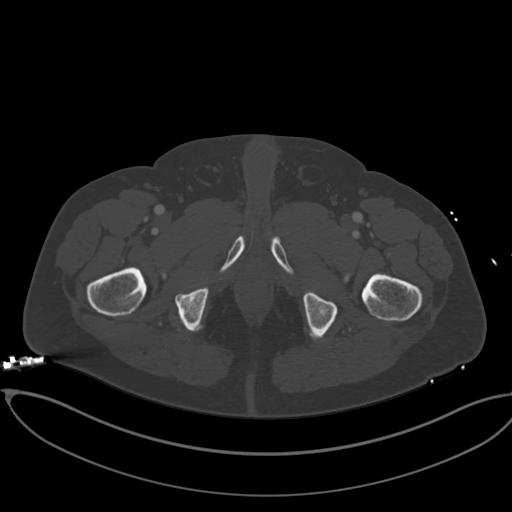
[im 59/330  soft-tissue]
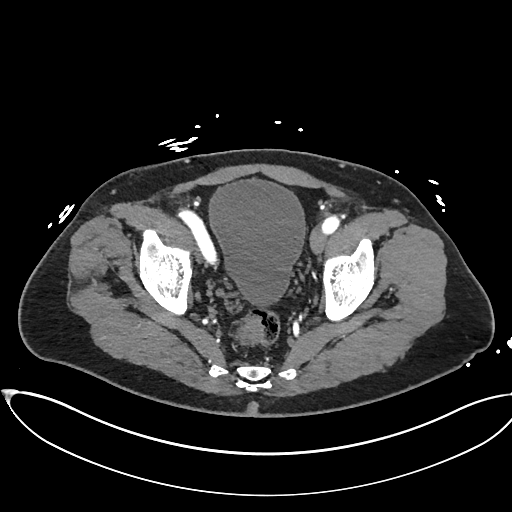
[im 78/330  soft-tissue]
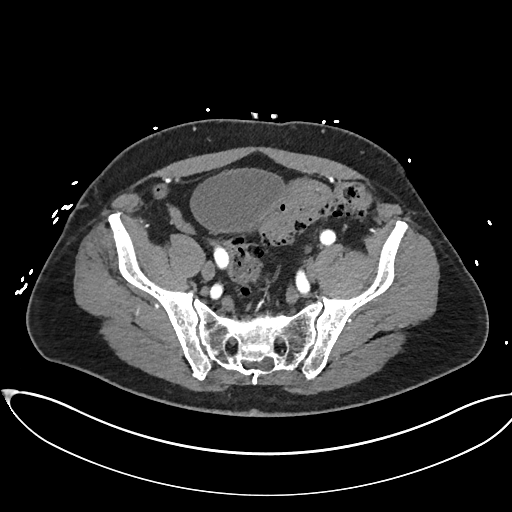
[im 97/330  soft-tissue]
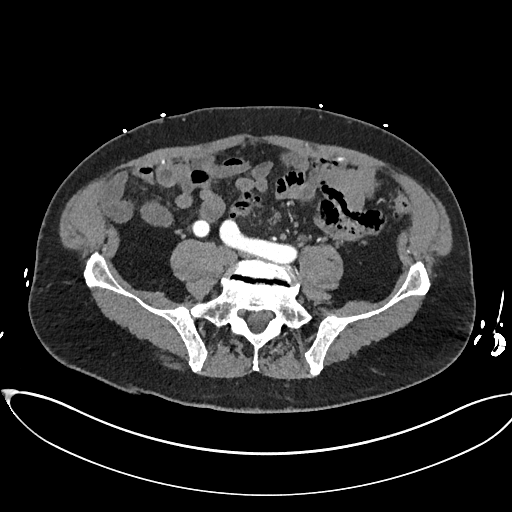
[im 136/330  soft-tissue]
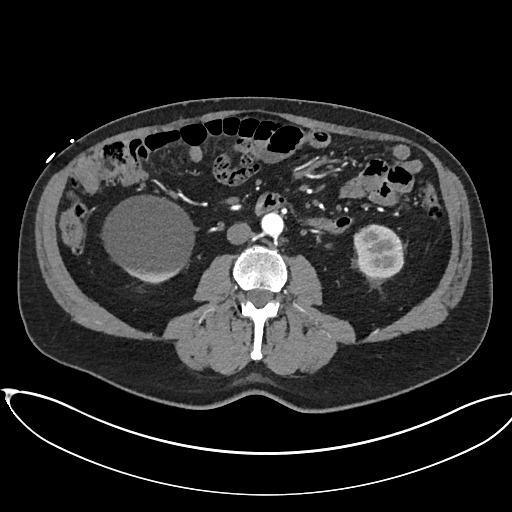
[im 155/330  soft-tissue]
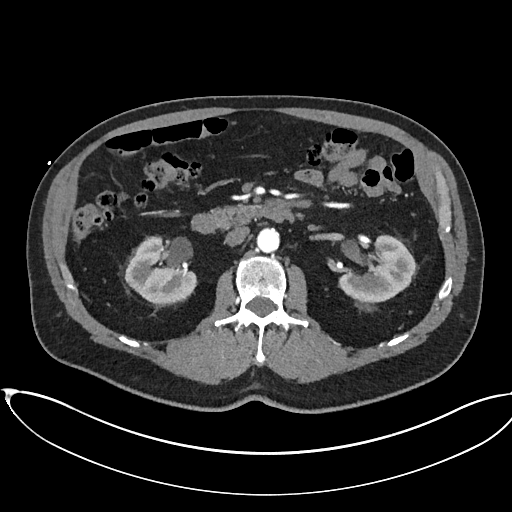
[im 175/330  soft-tissue]
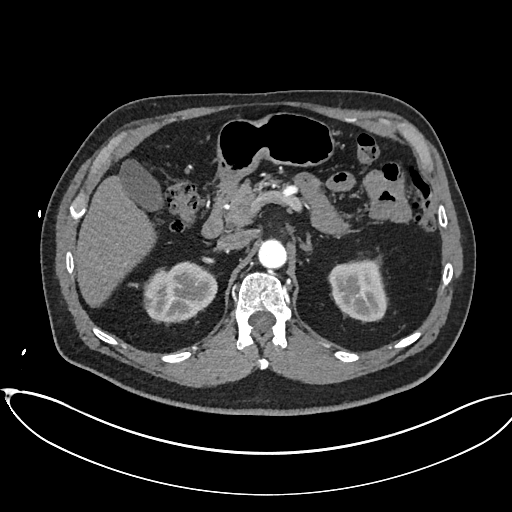
[im 213/330  soft-tissue]
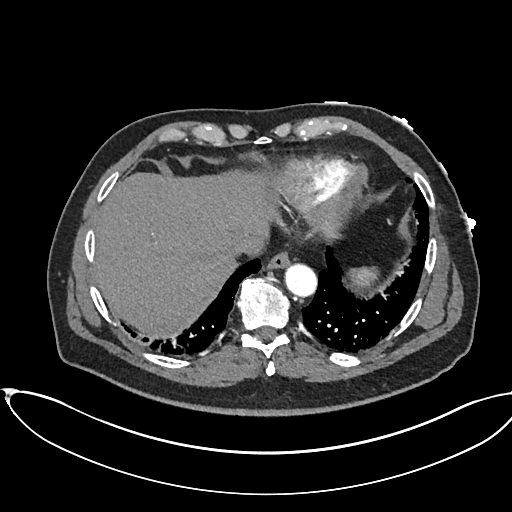
[im 233/330  soft-tissue]
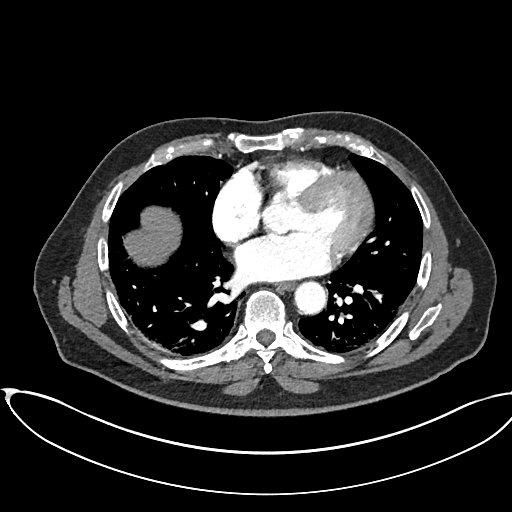
[im 233/330  bone]
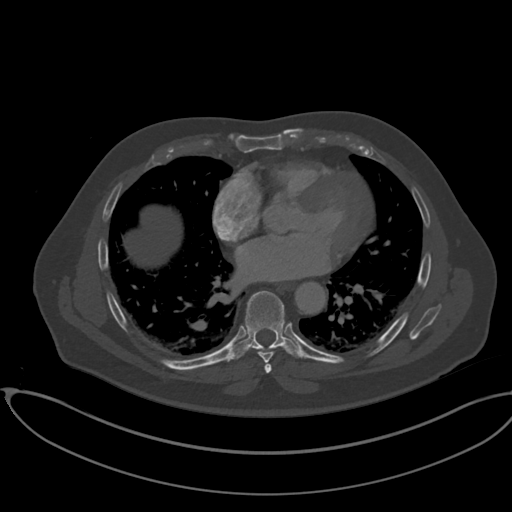
[im 252/330  soft-tissue]
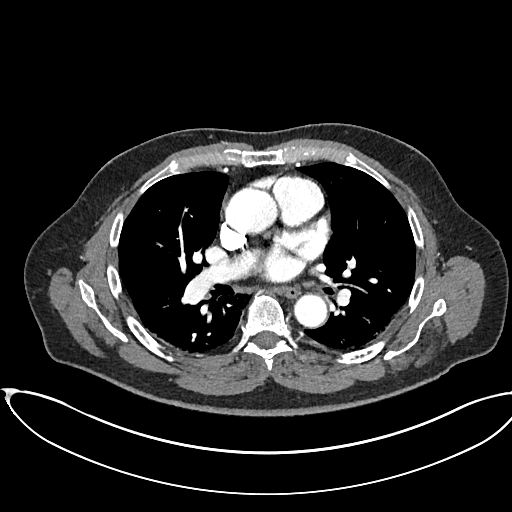
[im 291/330  soft-tissue]
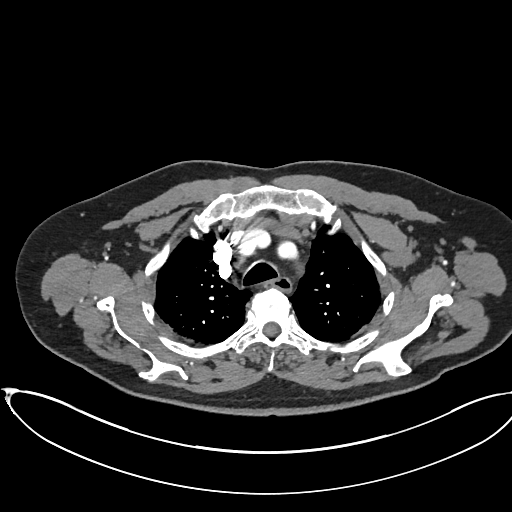
[im 310/330  soft-tissue]
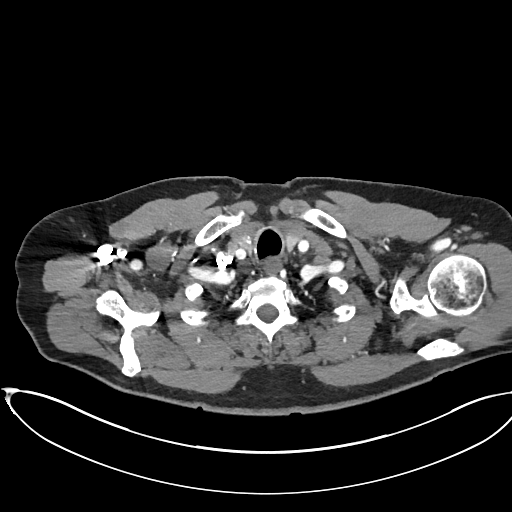

[Series 6: coronal mpr · coronal · 0.98mm/px · 3 of 130 slices shown]
[im 33/130  soft-tissue]
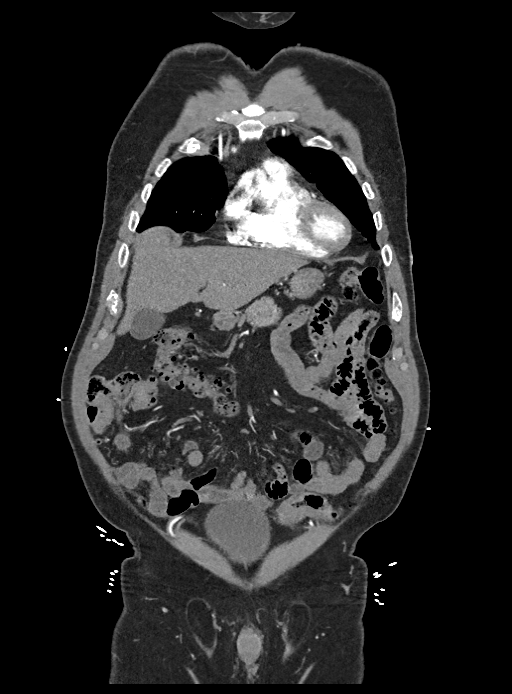
[im 65/130  soft-tissue]
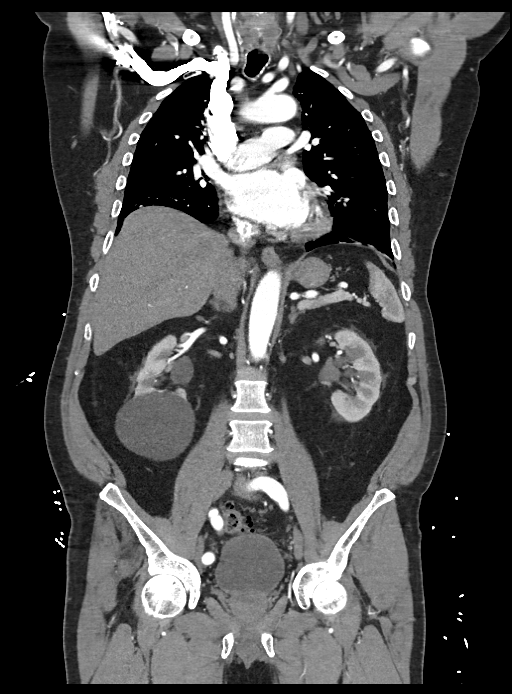
[im 97/130  soft-tissue]
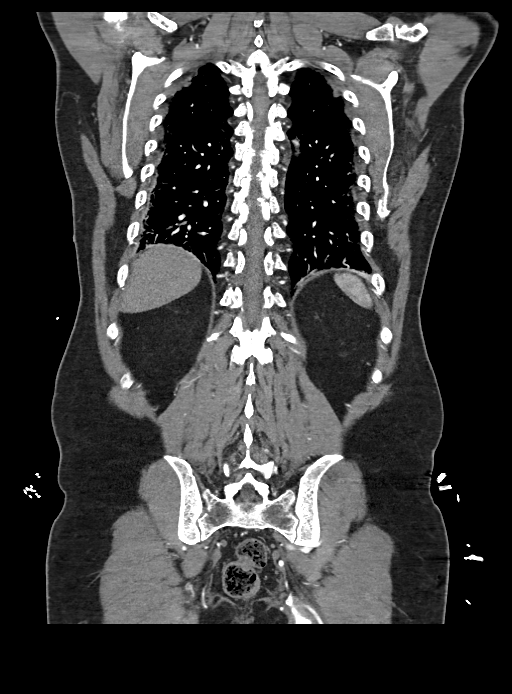

[15 of 46 positions shown; findings below may reference images not displayed]

FINDINGS: CTA CHEST FINDINGS

Non IV contrast images demonstrate no evidence of intramural
hematoma within the thoracic aorta. Contrast enhanced images
demonstrate no evidence of dissection or aneurysm of the ascending,
transverse, or descending thoracic aorta. Great vessels are normal
with bovine arch configuration. Coronary artery calcifications are
noted. There is no pericardial fluid. No hematoma within the
mediastinum. No evidence of central pulmonary embolism within the
pulmonary arteries.

No axillary or supraclavicular lymphadenopathy. No mediastinal hilar
lymphadenopathy. Esophagus is normal.

Review of the lung parenchyma demonstrates mild bibasilar
atelectasis without evidence of consolidation or fluid. No pulmonary
edema or infiltrate. No pneumothorax. Airways are normal.

Review of the MIP images confirms the above findings.

CTA ABDOMEN AND PELVIS FINDINGS

No evidence of dissection of the abdominal aorta or iliac arteries.
No evidence of aneurysm of the abdominal aorta or iliac arteries.
There is scattered intimal calcification of the aorta. The superior
mesenteric artery and celiac trunk are patent. Renal arteries are
patent. Iliac arteries are non aneurysmal.

No focal hepatic lesion. Multiple gallstones are present within the
gallbladder. No evidence of gallbladder wall thickening or
inflammation. The pancreas, spleen, adrenal glands, and kidneys are
normal. There is a large nonenhancing 8.5 cm cyst extending from the
lower pole of the right kidney.

The stomach, small bowel, and cecum are normal. The appendix is
normal. Scattered diverticula throughout the ascending, transverse,
and descending and sigmoid colon. The rectum is normal.

No evidence of retroperitoneal or periportal lymphadenopathy. No
free fluid the pelvis. Bladder and prostate gland are normal. Small
fat filled bilateral inguinal hernias are present. There is a small
fat filled umbilical hernia. No aggressive osseous lesion.

Review of the MIP images confirms the above findings.
IMPRESSION: 1. No evidence of thoracic aortic dissection or aneurysm.
2. No evidence of abdominal aorta or iliac artery dissection or
aneurysm.
3. Coronary artery calcifications are noted.
4. Cholelithiasis without cholecystitis.
5. Extensive diverticulosis without evidence of acute
diverticulitis.
6. Small inguinal and umbilical hernia.

## 2013-08-12 MED ORDER — FLUTICASONE PROPIONATE 50 MCG/ACT NA SUSP
2.0000 | Freq: Every day | NASAL | Status: DC | PRN
  Filled 2013-08-12: qty 16

## 2013-08-12 MED ORDER — DOCUSATE SODIUM 100 MG PO CAPS
100.0000 mg | ORAL_CAPSULE | Freq: Two times a day (BID) | ORAL | Status: DC
  Filled 2013-08-12 (×3): qty 1

## 2013-08-12 MED ORDER — ATORVASTATIN CALCIUM 40 MG PO TABS
40.0000 mg | ORAL_TABLET | Freq: Every morning | ORAL | Status: DC
  Administered 2013-08-13: 40 mg via ORAL
  Filled 2013-08-12: qty 1

## 2013-08-12 MED ORDER — SODIUM CHLORIDE 0.9 % IJ SOLN
3.0000 mL | Freq: Two times a day (BID) | INTRAMUSCULAR | Status: DC
  Administered 2013-08-13: 3 mL via INTRAVENOUS

## 2013-08-12 MED ORDER — FINASTERIDE 5 MG PO TABS
5.0000 mg | ORAL_TABLET | Freq: Every morning | ORAL | Status: DC
  Administered 2013-08-13: 5 mg via ORAL
  Filled 2013-08-12: qty 1

## 2013-08-12 MED ORDER — LISINOPRIL 10 MG PO TABS
10.0000 mg | ORAL_TABLET | Freq: Every morning | ORAL | Status: DC
  Administered 2013-08-13: 10 mg via ORAL
  Filled 2013-08-12: qty 1

## 2013-08-12 MED ORDER — ASPIRIN 325 MG PO TABS
162.5000 mg | ORAL_TABLET | Freq: Two times a day (BID) | ORAL | Status: DC
  Filled 2013-08-12 (×3): qty 0.5

## 2013-08-12 MED ORDER — IOHEXOL 350 MG/ML SOLN
100.0000 mL | Freq: Once | INTRAVENOUS | Status: AC | PRN
  Administered 2013-08-12: 100 mL via INTRAVENOUS

## 2013-08-12 NOTE — ED Provider Notes (Signed)
Date: 08/12/2013  Rate: 85  Rhythm: normal sinus rhythm  QRS Axis: left  Intervals: normal  ST/T Wave abnormalities: normal  Conduction Disutrbances:left anterior fascicular block  Narrative Interpretation:   Old EKG Reviewed: none available    Gilda Crease, MD 08/12/13 1626

## 2013-08-12 NOTE — ED Provider Notes (Signed)
CSN: [id number]     Arrival date & time 08/12/13  1617 History   First MD Initiated Contact with Patient 08/12/13 1622     Chief Complaint  Patient presents with  . Chest Pain    PT describes as chest tightness at epigastric area   HPI  72 y/o male with history as noted below who presents with cc of chest pain. Symptoms began this morning. He was in his usual state of health when he woke up this morning as well as in the last few days. When he woke up he states he sneezed when he "blacked out" and fell to the ground. He caught himself before hitting himself. He bruised his knee but has been able to walk on it since that time. He states that throughout the day he has had intermittent "dizziness" which he describes as being lightheaded. He states hi didn't feel well so he laid down. After waking up, he took his blood pressure and it was noted to be elevated to 190/98. He states that since that time he has had a dull "pressure" in his epigastrium and low chest. This pain doesn't radiate. He states he has mild associated "tingling" in his bilateral lower extremities. He had lower back pain earlier today that has since improved. He attributes that pain to recently working in the yard and states it's not unusual for him to have pain in those locations. He denies any LE weakness, saddle anesthesia, bladder or bowel incontinence. It improved slightly with the nitro given by EMS. He also received ASA by EMS.   Past Medical History  Diagnosis Date  . High blood pressure   . GERD (gastroesophageal reflux disease)   . Hypercholesterolemia    Past Surgical History  Procedure Laterality Date  . Right rotator cuff    . Tonsillectomy    . Rotator cuff repair    . Cardiac catheterization  2000    Cath to rule out cardiac problems RT HTN. PT denies significant findings   History reviewed. No pertinent family history. History  Substance Use Topics  . Smoking status: Never Smoker   . Smokeless tobacco:  Never Used  . Alcohol Use: 7.0 oz/week    14 drink(s) per week     Comment: or more    Review of Systems  Constitutional: Negative for fever and chills.  Respiratory: Negative for shortness of breath.   Cardiovascular: Positive for chest pain.  Gastrointestinal: Positive for abdominal pain. Negative for nausea and vomiting.  Neurological: Positive for dizziness and light-headedness. Negative for weakness, numbness and headaches.  All other systems reviewed and are negative.   Allergies  Demerol and Promethazine hcl  Home Medications   No current outpatient prescriptions on file. BP 142/88  Pulse 66  Temp(Src) 98.2 F (36.8 C) (Oral)  Resp 14  Ht 5\' 8"  (1.727 m)  Wt 189 lb 4.8 oz (85.866 kg)  BMI 28.79 kg/m2  SpO2 97% Physical Exam  Constitutional: He is oriented to person, place, and time. He appears well-developed and well-nourished. No distress.  HENT:  Head: Normocephalic and atraumatic.  Mouth/Throat: No oropharyngeal exudate.  Eyes: Conjunctivae are normal. Pupils are equal, round, and reactive to light.  Neck: Normal range of motion. Neck supple.  Cardiovascular: Normal rate and normal heart sounds.  Exam reveals no gallop and no friction rub.   No murmur heard. Pulses:      Radial pulses are 2+ on the right side, and 2+ on the left side.  Dorsalis pedis pulses are 2+ on the right side, and 2+ on the left side.       Posterior tibial pulses are 2+ on the right side, and 2+ on the left side.  Pulmonary/Chest: Effort normal and breath sounds normal.  Abdominal: Soft. He exhibits no distension. There is no tenderness.  Musculoskeletal: Normal range of motion. He exhibits no edema and no tenderness.       Cervical back: He exhibits no bony tenderness.       Thoracic back: He exhibits no bony tenderness.       Lumbar back: He exhibits no bony tenderness.  Neurological: He is alert and oriented to person, place, and time. He has normal strength and normal  reflexes. No cranial nerve deficit or sensory deficit. Coordination normal. GCS eye subscore is 4. GCS verbal subscore is 5. GCS motor subscore is 6.  Skin: Skin is warm and dry.    ED Course  Procedures (including critical care time) Labs Review Labs Reviewed  COMPREHENSIVE METABOLIC PANEL - Abnormal; Notable for the following:    Glucose, Bld 102 (*)    GFR calc non Af Amer 85 (*)    All other components within normal limits  CBC WITH DIFFERENTIAL - Abnormal; Notable for the following:    RBC 4.15 (*)    All other components within normal limits  POCT I-STAT, CHEM 8 - Abnormal; Notable for the following:    Glucose, Bld 105 (*)    All other components within normal limits  LIPASE, BLOOD  TROPONIN I  TROPONIN I  TROPONIN I  TSH  CG4 I-STAT (LACTIC ACID)  POCT I-STAT TROPONIN I   Imaging Review Dg Chest 2 View  08/12/2013   CLINICAL DATA:  Fall, syncope.  High blood pressure  EXAM: CHEST  2 VIEW  COMPARISON:  DG CHEST 2 VIEW dated 11/27/2005  FINDINGS: Normal cardiac silhouette. There is mild basilar atelectasis. No effusion, infiltrate, or pneumothorax. Degenerative osteophytosis of the thoracic spine.  IMPRESSION: Mild basilar atelectasis.  Otherwise no acute findings.   Electronically Signed   By: Genevive Bi M.D.   On: 08/12/2013 17:33   Ct Head Wo Contrast  08/12/2013   CLINICAL DATA:  Chest pain.  Loss of vision.  Possible syncope.  EXAM: CT HEAD WITHOUT CONTRAST  TECHNIQUE: Contiguous axial images were obtained from the base of the skull through the vertex without intravenous contrast.  COMPARISON:  None.  FINDINGS: Mild cerebral atrophy compatible with patient's age. No acute intracranial abnormality. Specifically, no hemorrhage, hydrocephalus, mass lesion, acute infarction, or significant intracranial injury. No acute calvarial abnormality. Visualized paranasal sinuses and mastoids clear. Orbital soft tissues unremarkable.  IMPRESSION: No acute intracranial abnormality.    Electronically Signed   By: Charlett Nose M.D.   On: 08/12/2013 18:48   Ct Angio Chest Aortic Dissect W &/or W/o  08/12/2013   CLINICAL DATA:  Dizziness, hypertension. Chest pain, concern for aortic dissection  EXAM: CT ANGIOGRAPHY CHEST, ABDOMEN AND PELVIS  TECHNIQUE: Multidetector CT imaging through the chest, abdomen and pelvis was performed using the standard protocol during bolus administration of intravenous contrast. Multiplanar reconstructed images including MIPs were obtained and reviewed to evaluate the vascular anatomy.  CONTRAST:  OMNIPAQUE IOHEXOL 350 MG/ML SOLN  COMPARISON:  None.  FINDINGS: CTA CHEST FINDINGS  Non IV contrast images demonstrate no evidence of intramural hematoma within the thoracic aorta. Contrast enhanced images demonstrate no evidence of dissection or aneurysm of the ascending, transverse, or descending  thoracic aorta. Great vessels are normal with bovine arch configuration. Coronary artery calcifications are noted. There is no pericardial fluid. No hematoma within the mediastinum. No evidence of central pulmonary embolism within the pulmonary arteries.  No axillary or supraclavicular lymphadenopathy. No mediastinal hilar lymphadenopathy. Esophagus is normal.  Review of the lung parenchyma demonstrates mild bibasilar atelectasis without evidence of consolidation or fluid. No pulmonary edema or infiltrate. No pneumothorax. Airways are normal.  Review of the MIP images confirms the above findings.  CTA ABDOMEN AND PELVIS FINDINGS  No evidence of dissection of the abdominal aorta or iliac arteries. No evidence of aneurysm of the abdominal aorta or iliac arteries. There is scattered intimal calcification of the aorta. The superior mesenteric artery and celiac trunk are patent. Renal arteries are patent. Iliac arteries are non aneurysmal.  No focal hepatic lesion. Multiple gallstones are present within the gallbladder. No evidence of gallbladder wall thickening or inflammation.  The pancreas, spleen, adrenal glands, and kidneys are normal. There is a large nonenhancing 8.5 cm cyst extending from the lower pole of the right kidney.  The stomach, small bowel, and cecum are normal. The appendix is normal. Scattered diverticula throughout the ascending, transverse, and descending and sigmoid colon. The rectum is normal.  No evidence of retroperitoneal or periportal lymphadenopathy. No free fluid the pelvis. Bladder and prostate gland are normal. Small fat filled bilateral inguinal hernias are present. There is a small fat filled umbilical hernia. No aggressive osseous lesion.  Review of the MIP images confirms the above findings.  IMPRESSION: 1. No evidence of thoracic aortic dissection or aneurysm. 2. No evidence of abdominal aorta or iliac artery dissection or aneurysm. 3. Coronary artery calcifications are noted. 4. Cholelithiasis without cholecystitis. 5. Extensive diverticulosis without evidence of acute diverticulitis. 6. Small inguinal and umbilical hernia.   Electronically Signed   By: Genevive Bi M.D.   On: 08/12/2013 18:58   Ct Angio Abd/pel W/ And/or W/o  08/12/2013   CLINICAL DATA:  Dizziness, hypertension. Chest pain, concern for aortic dissection  EXAM: CT ANGIOGRAPHY CHEST, ABDOMEN AND PELVIS  TECHNIQUE: Multidetector CT imaging through the chest, abdomen and pelvis was performed using the standard protocol during bolus administration of intravenous contrast. Multiplanar reconstructed images including MIPs were obtained and reviewed to evaluate the vascular anatomy.  CONTRAST:  OMNIPAQUE IOHEXOL 350 MG/ML SOLN  COMPARISON:  None.  FINDINGS: CTA CHEST FINDINGS  Non IV contrast images demonstrate no evidence of intramural hematoma within the thoracic aorta. Contrast enhanced images demonstrate no evidence of dissection or aneurysm of the ascending, transverse, or descending thoracic aorta. Great vessels are normal with bovine arch configuration. Coronary artery  calcifications are noted. There is no pericardial fluid. No hematoma within the mediastinum. No evidence of central pulmonary embolism within the pulmonary arteries.  No axillary or supraclavicular lymphadenopathy. No mediastinal hilar lymphadenopathy. Esophagus is normal.  Review of the lung parenchyma demonstrates mild bibasilar atelectasis without evidence of consolidation or fluid. No pulmonary edema or infiltrate. No pneumothorax. Airways are normal.  Review of the MIP images confirms the above findings.  CTA ABDOMEN AND PELVIS FINDINGS  No evidence of dissection of the abdominal aorta or iliac arteries. No evidence of aneurysm of the abdominal aorta or iliac arteries. There is scattered intimal calcification of the aorta. The superior mesenteric artery and celiac trunk are patent. Renal arteries are patent. Iliac arteries are non aneurysmal.  No focal hepatic lesion. Multiple gallstones are present within the gallbladder. No evidence of gallbladder wall  thickening or inflammation. The pancreas, spleen, adrenal glands, and kidneys are normal. There is a large nonenhancing 8.5 cm cyst extending from the lower pole of the right kidney.  The stomach, small bowel, and cecum are normal. The appendix is normal. Scattered diverticula throughout the ascending, transverse, and descending and sigmoid colon. The rectum is normal.  No evidence of retroperitoneal or periportal lymphadenopathy. No free fluid the pelvis. Bladder and prostate gland are normal. Small fat filled bilateral inguinal hernias are present. There is a small fat filled umbilical hernia. No aggressive osseous lesion.  Review of the MIP images confirms the above findings.  IMPRESSION: 1. No evidence of thoracic aortic dissection or aneurysm. 2. No evidence of abdominal aorta or iliac artery dissection or aneurysm. 3. Coronary artery calcifications are noted. 4. Cholelithiasis without cholecystitis. 5. Extensive diverticulosis without evidence of acute  diverticulitis. 6. Small inguinal and umbilical hernia.   Electronically Signed   By: Genevive Bi M.D.   On: 08/12/2013 18:58    EKG Interpretation    Date/Time:    Ventricular Rate:    PR Interval:    QRS Duration:   QT Interval:    QTC Calculation:   R Axis:     Text Interpretation:             MDM   72 y/o male who presents after a syncopal episode after sneezing. Complaining of lower chest and upper abdominal pain. Also has tingling in bilateral lower legs. Non-focal neuro exam. Given reported fall a CT head was obtained which was normal. Given back pain earlier, syncope, upper abdominal pain and paresthesias in his legs we were concerned for possible Aorticdissection. A CTA was obtained which revealed no evidence of dissection. EKG without acute ischemic changes. Initial troponin negative. CBC, Lipase, CMP and Lactic WNL. Given reported CP in setting of syncope will admit for ACS r/o. The patient was admitted to internal medicine for continued workup and management.   1. Syncope and collapse   2. Back pain   3. CAD (coronary artery disease)   4. High blood pressure        Shanon Ace, MD 08/12/13 [phone]

## 2013-08-12 NOTE — ED Notes (Signed)
PT states he woke up at about 0900 today and while getting coffee, he sneezed, vision blacked out and PT fell to ground. PT states he thinks he passed out but woke as he hit the ground and caught himself. PT reports dizziness this morning and then went to do yard work and began to have lower back pain. PT states he lied down at 3pm and put heating pad on back. PT states that he got up at about 330pm and had chest pain and dizziness as well as bilateral pedal parasthesia. PT reports eating lunch around 1130am and pain is epigastric, just below the xiphoid process. PT states BP taken this morning with dizziness with results being 151/85, then 177/90's, then 190/98 all within about 15 mins. PT had nitro SLx3 en route with EMS and BP currently 117/77, HR 77, 02 on 2L 100, RR 14. PT denied radiation of pain, but states he felt "weird....not right" and told his wife he thought he needed to come in to the ED. PT also received 324 of ASA en route and Ibuprofen x2 at 0930 and again at 1400

## 2013-08-12 NOTE — H&P (Signed)
[facility] History and Physical  BOYDE GRIECO ZOX:[id number] DOB: [birth year]    PCP:   Juline Patch, MD   Chief Complaint: syncope.  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  He did not have any seizure activitiies, bowel or bladder incontinence, and post ictal confusion.  He took some Motrin for his back pain, and noted that his BP was elevated, and not feeling right, he commoned EMS.  He has some epigastric discomfort, with no SOB, diaphoresis, or focal neurological deficits.  Evalaution in the ER included an EKG with incomplete RBBB, no acute ST-T changes, normal WBC, Hb, and renal fx tests.  He also had a negative head CT, Chest and abdominal CT.  Hospitalist was asked to admit him for syncope work up.  Rewiew of Systems:  Constitutional: Negative for malaise, fever and chills. No significant weight loss or weight gain Eyes: Negative for eye pain, redness and discharge, diplopia, visual changes, or flashes of light. ENMT: Negative for ear pain, hoarseness, nasal congestion, sinus pressure and sore throat. No headaches; tinnitus, drooling, or problem swallowing. Cardiovascular: Negative for chest pain, palpitations, diaphoresis, dyspnea and peripheral edema. ; No orthopnea, PND Respiratory: Negative for cough, hemoptysis, wheezing and stridor. No pleuritic chestpain. Gastrointestinal: Negative for nausea, vomiting, diarrhea, constipation, melena, blood in stool, hematemesis, jaundice and rectal bleeding.    Genitourinary: Negative for frequency, dysuria, incontinence,flank pain and hematuria; Musculoskeletal: Negative for  and neck pain. Negative for swelling and trauma.;  Skin: . Negative for pruritus, rash, abrasions, bruising and skin lesion.; ulcerations Neuro: Negative for headache,  lightheadedness and neck stiffness. Negative for weakness, altered level of consciousness , extremity weakness, burning feet, involuntary movement, seizure  Psych: negative for anxiety, depression, insomnia, tearfulness, panic attacks, hallucinations, paranoia, suicidal or homicidal ideation    Past Medical History  Diagnosis Date  . High blood pressure   . GERD (gastroesophageal reflux disease)   . Hypercholesterolemia     Past Surgical History  Procedure Laterality Date  . Right rotator cuff    . Tonsillectomy    . Rotator cuff repair    . Cardiac catheterization  2000    Cath to rule out cardiac problems RT HTN. PT denies significant findings    Medications:  HOME MEDS: Prior to Admission medications   Medication Sig Start Date End Date Taking? Authorizing Provider  aspirin 325 MG tablet Take 162.5 mg by mouth 2 (two) times daily.   Yes Historical Provider, MD  atorvastatin (LIPITOR) 40 MG tablet Take 40 mg by mouth every morning.   Yes Historical Provider, MD  finasteride (PROSCAR) 5 MG tablet Take 5 mg by mouth every morning.   Yes Historical Provider, MD  fluticasone (FLONASE) 50 MCG/ACT nasal spray Place 2 sprays into both nostrils daily as needed for allergies or rhinitis.   Yes Historical Provider, MD  ibuprofen (ADVIL,MOTRIN) 200 MG tablet Take 400 mg by mouth every 6 (six) hours as needed for moderate pain.   Yes Historical Provider, MD  lisinopril (PRINIVIL,ZESTRIL) 10 MG tablet Take 10 mg by mouth every morning.   Yes Historical Provider, MD     Allergies:  Allergies  Allergen Reactions  . Demerol Other (See Comments)    Causes dizziness  . Promethazine Hcl Other (See Comments)    Dizziness     Social History:  reports that he has never smoked. He does not have any smokeless tobacco history on file. He reports that he drinks about 7.0 ounces of alcohol per week. He reports that he does not use illicit drugs.  Family History: History reviewed. No pertinent  family history.   Physical Exam: Filed Vitals:   08/12/13 1700 08/12/13 1715 08/12/13 1900 08/12/13 1915  BP: 123/89 119/83 129/85 124/78  Pulse: 62 68 77 70  Temp:      TempSrc:      Resp: 18 18 18 13   SpO2: 100% 100% 100% 99%   Blood pressure 124/78, pulse 70, temperature 98.1 F (36.7 C), temperature source Oral, resp. rate 13, SpO2 99.00%.  GEN:  Pleasant patient lying in the stretcher in no acute distress; cooperative with exam. PSYCH:  alert and oriented x4; does not appear anxious or depressed; affect is appropriate. HEENT: Mucous membranes pink and anicteric; PERRLA; EOM intact; no cervical lymphadenopathy nor thyromegaly or carotid bruit; no JVD; There were no stridor. Neck is very supple. Breasts:: Not examined CHEST WALL: No tenderness CHEST: Normal respiration, clear to auscultation bilaterally.  HEART: Regular rate and rhythm.  There are no murmur, rub, or gallops.   BACK: No kyphosis or scoliosis; no CVA tenderness ABDOMEN: soft and non-tender; no masses, no organomegaly, normal abdominal bowel sounds; no pannus; no intertriginous candida. There is no rebound and no distention. Rectal Exam: Not done EXTREMITIES: No bone or joint deformity; age-appropriate arthropathy of the hands and knees; no edema; no ulcerations.  There is no calf tenderness. Genitalia: not examined PULSES: 2+ and symmetric SKIN: Normal hydration no rash or ulceration CNS: Cranial nerves 2-12 grossly intact no focal lateralizing neurologic deficit.  Speech is fluent; uvula elevated with phonation, facial symmetry and tongue midline. DTR are normal bilaterally, cerebella exam is intact, barbinski is negative and strengths are equaled bilaterally.  No sensory loss.   Labs on Admission:  Basic Metabolic Panel:  Recent Labs Lab 08/12/13 1648 08/12/13 1729  NA 139 139  K 4.1 3.9  CL 102 102  CO2 24  --   GLUCOSE 102* 105*  BUN 16 17  CREATININE 0.86 0.90  CALCIUM 8.6  --    Liver Function  Tests:  Recent Labs Lab 08/12/13 1648  AST 24  ALT 30  ALKPHOS 71  BILITOT 0.5  PROT 6.5  ALBUMIN 3.6    Recent Labs Lab 08/12/13 1648  LIPASE 14   No results found for this basename: AMMONIA,  in the last 168 hours CBC:  Recent Labs Lab 08/12/13 1648 08/12/13 1729  WBC 8.2  --   NEUTROABS 6.0  --   HGB 14.1 14.6  HCT 41.0 43.0  MCV 98.8  --   PLT 207  --    Cardiac Enzymes: No results found for this basename: CKTOTAL, CKMB, CKMBINDEX, TROPONINI,  in the last 168 hours  CBG: No results found for this basename: GLUCAP,  in the last 168 hours   Radiological Exams on Admission: Dg Chest 2 View  08/12/2013   CLINICAL DATA:  Fall, syncope.  High blood pressure  EXAM: CHEST  2 VIEW  COMPARISON:  DG CHEST 2 VIEW dated 11/27/2005  FINDINGS: Normal cardiac silhouette. There is mild basilar atelectasis. No effusion, infiltrate, or pneumothorax. Degenerative osteophytosis of the thoracic spine.  IMPRESSION: Mild basilar atelectasis.  Otherwise no acute findings.   Electronically Signed   By: Genevive Bi M.D.   On: 08/12/2013 17:33   Ct Head Wo Contrast  08/12/2013   CLINICAL DATA:  Chest pain.  Loss of vision.  Possible syncope.  EXAM: CT HEAD WITHOUT CONTRAST  TECHNIQUE: Contiguous axial images were obtained from the base of the skull through the vertex without intravenous contrast.  COMPARISON:  None.  FINDINGS: Mild cerebral atrophy compatible with patient's age. No acute intracranial abnormality. Specifically, no hemorrhage, hydrocephalus, mass lesion, acute infarction, or significant intracranial injury. No acute calvarial abnormality. Visualized paranasal sinuses and mastoids clear. Orbital soft tissues unremarkable.  IMPRESSION: No acute intracranial abnormality.   Electronically Signed   By: Charlett Nose M.D.   On: 08/12/2013 18:48   Ct Angio Chest Aortic Dissect W &/or W/o  08/12/2013   CLINICAL DATA:  Dizziness, hypertension. Chest pain, concern for aortic  dissection  EXAM: CT ANGIOGRAPHY CHEST, ABDOMEN AND PELVIS  TECHNIQUE: Multidetector CT imaging through the chest, abdomen and pelvis was performed using the standard protocol during bolus administration of intravenous contrast. Multiplanar reconstructed images including MIPs were obtained and reviewed to evaluate the vascular anatomy.  CONTRAST:  OMNIPAQUE IOHEXOL 350 MG/ML SOLN  COMPARISON:  None.  FINDINGS: CTA CHEST FINDINGS  Non IV contrast images demonstrate no evidence of intramural hematoma within the thoracic aorta. Contrast enhanced images demonstrate no evidence of dissection or aneurysm of the ascending, transverse, or descending thoracic aorta. Great vessels are normal with bovine arch configuration. Coronary artery calcifications are noted. There is no pericardial fluid. No hematoma within the mediastinum. No evidence of central pulmonary embolism within the pulmonary arteries.  No axillary or supraclavicular lymphadenopathy. No mediastinal hilar lymphadenopathy. Esophagus is normal.  Review of the lung parenchyma demonstrates mild bibasilar atelectasis without evidence of consolidation or fluid. No pulmonary edema or infiltrate. No pneumothorax. Airways are normal.  Review of the MIP images confirms the above findings.  CTA ABDOMEN AND PELVIS FINDINGS  No evidence of dissection of the abdominal aorta or iliac arteries. No evidence of aneurysm of the abdominal aorta or iliac arteries. There is scattered intimal calcification of the aorta. The superior mesenteric artery and celiac trunk are patent. Renal arteries are patent. Iliac arteries are non aneurysmal.  No focal hepatic lesion. Multiple gallstones are present within the gallbladder. No evidence of gallbladder wall thickening or inflammation. The pancreas, spleen, adrenal glands, and kidneys are normal. There is a large nonenhancing 8.5 cm cyst extending from the lower pole of the right kidney.  The stomach, small bowel, and cecum are  normal. The appendix is normal. Scattered diverticula throughout the ascending, transverse, and descending and sigmoid colon. The rectum is normal.  No evidence of retroperitoneal or periportal lymphadenopathy. No free fluid the pelvis. Bladder and prostate gland are normal. Small fat filled bilateral inguinal hernias are present. There is a small fat filled umbilical hernia. No aggressive osseous lesion.  Review of the MIP images confirms the above findings.  IMPRESSION: 1. No evidence of thoracic aortic dissection or aneurysm. 2. No evidence of abdominal aorta or iliac artery dissection or aneurysm. 3. Coronary artery calcifications are noted. 4. Cholelithiasis without cholecystitis. 5. Extensive diverticulosis without evidence of acute diverticulitis. 6. Small inguinal and umbilical hernia.   Electronically Signed   By: Genevive Bi M.D.   On: 08/12/2013 18:58   Ct Angio Abd/pel W/ And/or W/o  08/12/2013   CLINICAL DATA:  Dizziness, hypertension. Chest pain, concern for aortic dissection  EXAM: CT ANGIOGRAPHY CHEST, ABDOMEN AND PELVIS  TECHNIQUE: Multidetector CT imaging through the chest, abdomen and pelvis was performed using the standard protocol during  bolus administration of intravenous contrast. Multiplanar reconstructed images including MIPs were obtained and reviewed to evaluate the vascular anatomy.  CONTRAST:  OMNIPAQUE IOHEXOL 350 MG/ML SOLN  COMPARISON:  None.  FINDINGS: CTA CHEST FINDINGS  Non IV contrast images demonstrate no evidence of intramural hematoma within the thoracic aorta. Contrast enhanced images demonstrate no evidence of dissection or aneurysm of the ascending, transverse, or descending thoracic aorta. Great vessels are normal with bovine arch configuration. Coronary artery calcifications are noted. There is no pericardial fluid. No hematoma within the mediastinum. No evidence of central pulmonary embolism within the pulmonary arteries.  No axillary or supraclavicular  lymphadenopathy. No mediastinal hilar lymphadenopathy. Esophagus is normal.  Review of the lung parenchyma demonstrates mild bibasilar atelectasis without evidence of consolidation or fluid. No pulmonary edema or infiltrate. No pneumothorax. Airways are normal.  Review of the MIP images confirms the above findings.  CTA ABDOMEN AND PELVIS FINDINGS  No evidence of dissection of the abdominal aorta or iliac arteries. No evidence of aneurysm of the abdominal aorta or iliac arteries. There is scattered intimal calcification of the aorta. The superior mesenteric artery and celiac trunk are patent. Renal arteries are patent. Iliac arteries are non aneurysmal.  No focal hepatic lesion. Multiple gallstones are present within the gallbladder. No evidence of gallbladder wall thickening or inflammation. The pancreas, spleen, adrenal glands, and kidneys are normal. There is a large nonenhancing 8.5 cm cyst extending from the lower pole of the right kidney.  The stomach, small bowel, and cecum are normal. The appendix is normal. Scattered diverticula throughout the ascending, transverse, and descending and sigmoid colon. The rectum is normal.  No evidence of retroperitoneal or periportal lymphadenopathy. No free fluid the pelvis. Bladder and prostate gland are normal. Small fat filled bilateral inguinal hernias are present. There is a small fat filled umbilical hernia. No aggressive osseous lesion.  Review of the MIP images confirms the above findings.  IMPRESSION: 1. No evidence of thoracic aortic dissection or aneurysm. 2. No evidence of abdominal aorta or iliac artery dissection or aneurysm. 3. Coronary artery calcifications are noted. 4. Cholelithiasis without cholecystitis. 5. Extensive diverticulosis without evidence of acute diverticulitis. 6. Small inguinal and umbilical hernia.   Electronically Signed   By: Genevive Bi M.D.   On: 08/12/2013 18:58    EKG: Independently reviewed. EKG showed NSR with incomplete  RBBB. No acute ST-T changes.   Assessment/Plan Present on Admission:  . Syncope and collapse . High blood pressure . GERD (gastroesophageal reflux disease) . Back pain . CAD (coronary artery disease)   PLAN:  Will admit for syncope work up.  I suspect no cause will be found.  The hypertensive response could be from the NSAIDS.  Will cycle his troponins, and obtain and ECHO.  I will give some IVF, since he had received contrast dyes.  Will continue his home meds, including his HTN meds (Lisinopril).  He is stable, full code, and will be admitted to telemetry.  Thank you for asking me to participate in his care.  Other plans as per orders.  Code Status: FULL Unk Lightning, MD. [facility] Pager [phone] 7pm to 7am.  08/12/2013, 9:02 PM

## 2013-08-13 DIAGNOSIS — I517 Cardiomegaly: Secondary | ICD-10-CM

## 2013-08-13 LAB — TROPONIN I: Troponin I: <0.3 ng/mL (ref <0.30)

## 2013-08-13 LAB — TSH: TSH: 4.315 u[IU]/mL (ref 0.350–4.500)

## 2013-08-13 MED ORDER — ASPIRIN EC 81 MG PO TBEC
162.0000 mg | DELAYED_RELEASE_TABLET | Freq: Two times a day (BID) | ORAL | Status: DC
  Administered 2013-08-13: 162 mg via ORAL
  Filled 2013-08-13 (×2): qty 2

## 2013-08-13 NOTE — Progress Notes (Signed)
UR completed 

## 2013-08-13 NOTE — Progress Notes (Signed)
Reviewed discharge instructions with patient and he stated his understanding.  Patient discharged home with wife via wheelchair.  Colman Cater

## 2013-08-13 NOTE — Progress Notes (Signed)
  Echocardiogram 2D Echocardiogram has been performed.  Wesley [name] FRANCES 08/13/2013, 12:48 PM

## 2013-08-13 NOTE — Discharge Summary (Signed)
Physician Discharge Summary  Wesley [name] VHQ:[id number] DOB: [birth year] DOA: 08/12/2013  PCP: Juline Patch, MD  Admit date: 08/12/2013 Discharge date: 08/13/2013  Time spent: 30 minutes  Recommendations for Outpatient Follow-up:  1. 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 He did not have any seizure activitiies, bowel or bladder incontinence, and post ictal confusion. He took some Motrin for his back pain, and noted that his BP was elevated, and not feeling right, he commoned EMS. He has some epigastric discomfort, with no SOB, diaphoresis, or focal neurological deficits. Evalaution in the ER included an EKG with incomplete RBBB, no acute ST-T changes, normal WBC, Hb, and renal fx tests. He also had a negative head CT, Chest and abdominal CT. Hospitalist was asked to admit him for syncope work up.  Hospital Course:  Patient is a pleasant 72 year old gentleman, with a past medical history of hypertension who was admitted to the medicine service on 08/12/2013 after having a syncopal episode. He reported having dizziness and lightheadedness prior to syncopal event. He was placed in overnight observation. Telemetry showed no changes as he had 3 negative sets of troponin. A transthoracic echocardiogram performed on 08/13/2013 showed an ejection fraction of 60-65% without wall motion abnormalities. He had grade 2  diastolic dysfunction. No stenosis was noted of aortic valve. Patient did have further episodes of dizziness lightheadedness or syncope. Unclear etiology of his syncope, possibilities include hypotension although blood pressures were stable during this hospitalization. I encouraged him to keep a daily log of his blood pressures and present these to his primary care provider.  Procedures:  Transthoracic echocardiogram performed on 08/13/2013 impression: Left ventricle: The cavity size was normal. Wall thickness was increased in a pattern of mild LVH. Systolic function was normal. The estimated ejection fraction was in the range of 60% to 65%. Wall motion was normal; there were no regional wall motion abnormalities. Features are consistent with a pseudonormal left ventricular filling pattern, with concomitant abnormal relaxation and increased filling pressure (grade 2 diastolic dysfunction).    Discharge Exam: Filed Vitals:   08/13/13 1315  BP: 127/87  Pulse: 82  Temp: 97.7 F (36.5 C)  Resp: 18    General: No acute distress patient is awake alert and oriented. Cardiovascular: Regular rate and rhythm normal S1-S2 Respiratory: Lungs are clear to auscultation bilaterally Abdomen: Soft nontender nondistended  Discharge Instructions  Discharge Orders   Future Orders Complete By Expires   Call MD for:  extreme fatigue  As directed    Call MD for:  persistant dizziness or light-headedness  As directed    Call MD for:  persistant nausea and vomiting  As directed    Call MD for:  temperature >100.4  As directed    Diet - low sodium heart healthy  As directed    Increase activity slowly  As directed        Medication List  STOP taking these medications       ibuprofen 200 MG tablet  Commonly known as:  ADVIL,MOTRIN      TAKE these medications       aspirin 325 MG tablet  Take 162.5 mg by mouth 2 (two) times daily.     atorvastatin 40 MG tablet  Commonly known as:   LIPITOR  Take 40 mg by mouth every morning.     finasteride 5 MG tablet  Commonly known as:  PROSCAR  Take 5 mg by mouth every morning.     fluticasone 50 MCG/ACT nasal spray  Commonly known as:  FLONASE  Place 2 sprays into both nostrils daily as needed for allergies or rhinitis.     lisinopril 10 MG tablet  Commonly known as:  PRINIVIL,ZESTRIL  Take 10 mg by mouth every morning.       Allergies  Allergen Reactions  . Demerol Other (See Comments)    Causes dizziness  . Promethazine Hcl Other (See Comments)    Dizziness        Follow-up Information   Follow up with PANG,RICHARD, MD In 1 week.   Specialty:  Internal Medicine   Contact information:   [address] [place] [address] [phone]        The results of significant diagnostics from this hospitalization (including imaging, microbiology, ancillary and laboratory) are listed below for reference.    Significant Diagnostic Studies: Dg Chest 2 View  08/12/2013   CLINICAL DATA:  Fall, syncope.  High blood pressure  EXAM: CHEST  2 VIEW  COMPARISON:  DG CHEST 2 VIEW dated 11/27/2005  FINDINGS: Normal cardiac silhouette. There is mild basilar atelectasis. No effusion, infiltrate, or pneumothorax. Degenerative osteophytosis of the thoracic spine.  IMPRESSION: Mild basilar atelectasis.  Otherwise no acute findings.   Electronically Signed   By: Genevive Bi M.D.   On: 08/12/2013 17:33   Ct Head Wo Contrast  08/12/2013   CLINICAL DATA:  Chest pain.  Loss of vision.  Possible syncope.  EXAM: CT HEAD WITHOUT CONTRAST  TECHNIQUE: Contiguous axial images were obtained from the base of the skull through the vertex without intravenous contrast.  COMPARISON:  None.  FINDINGS: Mild cerebral atrophy compatible with patient's age. No acute intracranial abnormality. Specifically, no hemorrhage, hydrocephalus, mass lesion, acute infarction, or significant intracranial injury. No acute calvarial abnormality.  Visualized paranasal sinuses and mastoids clear. Orbital soft tissues unremarkable.  IMPRESSION: No acute intracranial abnormality.   Electronically Signed   By: Charlett Nose M.D.   On: 08/12/2013 18:48   Ct Angio Chest Aortic Dissect W &/or W/o  08/12/2013   CLINICAL DATA:  Dizziness, hypertension. Chest pain, concern for aortic dissection  EXAM: CT ANGIOGRAPHY CHEST, ABDOMEN AND PELVIS  TECHNIQUE: Multidetector CT imaging through the chest, abdomen and pelvis was performed using the standard protocol during bolus administration of intravenous contrast. Multiplanar reconstructed images including MIPs were obtained and reviewed to evaluate the vascular anatomy.  CONTRAST:  OMNIPAQUE IOHEXOL 350 MG/ML SOLN  COMPARISON:  None.  FINDINGS: CTA CHEST FINDINGS  Non IV contrast images demonstrate no evidence of intramural hematoma within the thoracic aorta. Contrast enhanced images demonstrate no evidence of dissection or aneurysm of the ascending, transverse, or descending thoracic aorta. Great vessels are normal with bovine arch configuration. Coronary artery calcifications are noted. There is no pericardial fluid. No hematoma within the mediastinum. No evidence of central pulmonary embolism within the pulmonary arteries.  No axillary or supraclavicular lymphadenopathy.  No mediastinal hilar lymphadenopathy. Esophagus is normal.  Review of the lung parenchyma demonstrates mild bibasilar atelectasis without evidence of consolidation or fluid. No pulmonary edema or infiltrate. No pneumothorax. Airways are normal.  Review of the MIP images confirms the above findings.  CTA ABDOMEN AND PELVIS FINDINGS  No evidence of dissection of the abdominal aorta or iliac arteries. No evidence of aneurysm of the abdominal aorta or iliac arteries. There is scattered intimal calcification of the aorta. The superior mesenteric artery and celiac trunk are patent. Renal arteries are patent. Iliac arteries are non aneurysmal.  No  focal hepatic lesion. Multiple gallstones are present within the gallbladder. No evidence of gallbladder wall thickening or inflammation. The pancreas, spleen, adrenal glands, and kidneys are normal. There is a large nonenhancing 8.5 cm cyst extending from the lower pole of the right kidney.  The stomach, small bowel, and cecum are normal. The appendix is normal. Scattered diverticula throughout the ascending, transverse, and descending and sigmoid colon. The rectum is normal.  No evidence of retroperitoneal or periportal lymphadenopathy. No free fluid the pelvis. Bladder and prostate gland are normal. Small fat filled bilateral inguinal hernias are present. There is a small fat filled umbilical hernia. No aggressive osseous lesion.  Review of the MIP images confirms the above findings.  IMPRESSION: 1. No evidence of thoracic aortic dissection or aneurysm. 2. No evidence of abdominal aorta or iliac artery dissection or aneurysm. 3. Coronary artery calcifications are noted. 4. Cholelithiasis without cholecystitis. 5. Extensive diverticulosis without evidence of acute diverticulitis. 6. Small inguinal and umbilical hernia.   Electronically Signed   By: Genevive Bi M.D.   On: 08/12/2013 18:58   Ct Angio Abd/pel W/ And/or W/o  08/12/2013   CLINICAL DATA:  Dizziness, hypertension. Chest pain, concern for aortic dissection  EXAM: CT ANGIOGRAPHY CHEST, ABDOMEN AND PELVIS  TECHNIQUE: Multidetector CT imaging through the chest, abdomen and pelvis was performed using the standard protocol during bolus administration of intravenous contrast. Multiplanar reconstructed images including MIPs were obtained and reviewed to evaluate the vascular anatomy.  CONTRAST:  OMNIPAQUE IOHEXOL 350 MG/ML SOLN  COMPARISON:  None.  FINDINGS: CTA CHEST FINDINGS  Non IV contrast images demonstrate no evidence of intramural hematoma within the thoracic aorta. Contrast enhanced images demonstrate no evidence of dissection or aneurysm  of the ascending, transverse, or descending thoracic aorta. Great vessels are normal with bovine arch configuration. Coronary artery calcifications are noted. There is no pericardial fluid. No hematoma within the mediastinum. No evidence of central pulmonary embolism within the pulmonary arteries.  No axillary or supraclavicular lymphadenopathy. No mediastinal hilar lymphadenopathy. Esophagus is normal.  Review of the lung parenchyma demonstrates mild bibasilar atelectasis without evidence of consolidation or fluid. No pulmonary edema or infiltrate. No pneumothorax. Airways are normal.  Review of the MIP images confirms the above findings.  CTA ABDOMEN AND PELVIS FINDINGS  No evidence of dissection of the abdominal aorta or iliac arteries. No evidence of aneurysm of the abdominal aorta or iliac arteries. There is scattered intimal calcification of the aorta. The superior mesenteric artery and celiac trunk are patent. Renal arteries are patent. Iliac arteries are non aneurysmal.  No focal hepatic lesion. Multiple gallstones are present within the gallbladder. No evidence of gallbladder wall thickening or inflammation. The pancreas, spleen, adrenal glands, and kidneys are normal. There is a large nonenhancing 8.5 cm cyst extending from the lower pole of the right kidney.  The stomach, small bowel, and cecum are normal. The appendix is  normal. Scattered diverticula throughout the ascending, transverse, and descending and sigmoid colon. The rectum is normal.  No evidence of retroperitoneal or periportal lymphadenopathy. No free fluid the pelvis. Bladder and prostate gland are normal. Small fat filled bilateral inguinal hernias are present. There is a small fat filled umbilical hernia. No aggressive osseous lesion.  Review of the MIP images confirms the above findings.  IMPRESSION: 1. No evidence of thoracic aortic dissection or aneurysm. 2. No evidence of abdominal aorta or iliac artery dissection or aneurysm. 3.  Coronary artery calcifications are noted. 4. Cholelithiasis without cholecystitis. 5. Extensive diverticulosis without evidence of acute diverticulitis. 6. Small inguinal and umbilical hernia.   Electronically Signed   By: Genevive Bi M.D.   On: 08/12/2013 18:58    Microbiology: No results found for this or any previous visit (from the past 240 hour(s)).   Labs: Basic Metabolic Panel:  Recent Labs Lab 08/12/13 1648 08/12/13 1729  NA 139 139  K 4.1 3.9  CL 102 102  CO2 24  --   GLUCOSE 102* 105*  BUN 16 17  CREATININE 0.86 0.90  CALCIUM 8.6  --    Liver Function Tests:  Recent Labs Lab 08/12/13 1648  AST 24  ALT 30  ALKPHOS 71  BILITOT 0.5  PROT 6.5  ALBUMIN 3.6    Recent Labs Lab 08/12/13 1648  LIPASE 14   No results found for this basename: AMMONIA,  in the last 168 hours CBC:  Recent Labs Lab 08/12/13 1648 08/12/13 1729  WBC 8.2  --   NEUTROABS 6.0  --   HGB 14.1 14.6  HCT 41.0 43.0  MCV 98.8  --   PLT 207  --    Cardiac Enzymes:  Recent Labs Lab 08/12/13 2105 08/13/13 0437 08/13/13 0905  TROPONINI <0.30 <0.30 <0.30   BNP: BNP (last 3 results) No results found for this basename: PROBNP,  in the last 8760 hours CBG: No results found for this basename: GLUCAP,  in the last 168 hours     Signed:  Jeralyn Bennett  [facility] 08/13/2013, 5:18 PM

## 2013-08-17 NOTE — ED Provider Notes (Signed)
I saw and evaluated the patient, reviewed the resident's note and I agree with the findings and plan.  EKG Interpretation    Date/Time:  Tuesday August 12 2013 16:22:44 EST Ventricular Rate:  85 PR Interval:  195 QRS Duration: 99 QT Interval:  384 QTC Calculation: 457 R Axis:   -80 Text Interpretation:  Sinus rhythm LAD, consider left anterior fascicular block RSR' in V1 or V2, right VCD or RVH ED PHYSICIAN INTERPRETATION AVAILABLE IN [facility] Confirmed by TEST, RECORD ([id number]) on 08/14/2013 11:35:20 AM            Patient seen and evaluated with the resident for syncope and chest pain. Patient does complain of some abdominal discomfort as well, therefore aortic dissection considered. CT angiography negative. Patient to be admitted for further monitoring and management/evaluation of etiology of syncope as well as cardiac rule out.  Orpah Greek, MD 08/17/13 [phone]

## 2014-12-21 DIAGNOSIS — Z961 Presence of intraocular lens: Secondary | ICD-10-CM | POA: Diagnosis not present

## 2014-12-21 DIAGNOSIS — H40013 Open angle with borderline findings, low risk, bilateral: Secondary | ICD-10-CM | POA: Diagnosis not present

## 2015-01-01 DIAGNOSIS — I10 Essential (primary) hypertension: Secondary | ICD-10-CM | POA: Diagnosis not present

## 2015-01-01 DIAGNOSIS — E559 Vitamin D deficiency, unspecified: Secondary | ICD-10-CM | POA: Diagnosis not present

## 2015-01-01 DIAGNOSIS — E780 Pure hypercholesterolemia: Secondary | ICD-10-CM | POA: Diagnosis not present

## 2015-01-01 DIAGNOSIS — R7309 Other abnormal glucose: Secondary | ICD-10-CM | POA: Diagnosis not present

## 2015-01-05 DIAGNOSIS — E559 Vitamin D deficiency, unspecified: Secondary | ICD-10-CM | POA: Diagnosis not present

## 2015-01-05 DIAGNOSIS — Z23 Encounter for immunization: Secondary | ICD-10-CM | POA: Diagnosis not present

## 2015-01-05 DIAGNOSIS — E780 Pure hypercholesterolemia: Secondary | ICD-10-CM | POA: Diagnosis not present

## 2015-01-05 DIAGNOSIS — I10 Essential (primary) hypertension: Secondary | ICD-10-CM | POA: Diagnosis not present

## 2015-01-08 DIAGNOSIS — L570 Actinic keratosis: Secondary | ICD-10-CM | POA: Diagnosis not present

## 2015-01-08 DIAGNOSIS — D2271 Melanocytic nevi of right lower limb, including hip: Secondary | ICD-10-CM | POA: Diagnosis not present

## 2015-01-08 DIAGNOSIS — D1801 Hemangioma of skin and subcutaneous tissue: Secondary | ICD-10-CM | POA: Diagnosis not present

## 2015-01-08 DIAGNOSIS — D0472 Carcinoma in situ of skin of left lower limb, including hip: Secondary | ICD-10-CM | POA: Diagnosis not present

## 2015-01-08 DIAGNOSIS — L821 Other seborrheic keratosis: Secondary | ICD-10-CM | POA: Diagnosis not present

## 2015-01-08 DIAGNOSIS — D485 Neoplasm of uncertain behavior of skin: Secondary | ICD-10-CM | POA: Diagnosis not present

## 2015-01-08 DIAGNOSIS — C44529 Squamous cell carcinoma of skin of other part of trunk: Secondary | ICD-10-CM | POA: Diagnosis not present

## 2015-01-08 DIAGNOSIS — Z85828 Personal history of other malignant neoplasm of skin: Secondary | ICD-10-CM | POA: Diagnosis not present

## 2015-02-18 DIAGNOSIS — G4733 Obstructive sleep apnea (adult) (pediatric): Secondary | ICD-10-CM | POA: Diagnosis not present

## 2015-03-29 DIAGNOSIS — G4733 Obstructive sleep apnea (adult) (pediatric): Secondary | ICD-10-CM | POA: Diagnosis not present

## 2015-04-22 DIAGNOSIS — G4730 Sleep apnea, unspecified: Secondary | ICD-10-CM | POA: Diagnosis not present

## 2015-04-22 DIAGNOSIS — J40 Bronchitis, not specified as acute or chronic: Secondary | ICD-10-CM | POA: Diagnosis not present

## 2015-04-22 DIAGNOSIS — R05 Cough: Secondary | ICD-10-CM | POA: Diagnosis not present

## 2015-06-28 DIAGNOSIS — Z23 Encounter for immunization: Secondary | ICD-10-CM | POA: Diagnosis not present

## 2015-06-28 DIAGNOSIS — I10 Essential (primary) hypertension: Secondary | ICD-10-CM | POA: Diagnosis not present

## 2015-06-28 DIAGNOSIS — E7800 Pure hypercholesterolemia, unspecified: Secondary | ICD-10-CM | POA: Diagnosis not present

## 2015-06-28 DIAGNOSIS — Z125 Encounter for screening for malignant neoplasm of prostate: Secondary | ICD-10-CM | POA: Diagnosis not present

## 2015-06-30 DIAGNOSIS — R351 Nocturia: Secondary | ICD-10-CM | POA: Diagnosis not present

## 2015-06-30 DIAGNOSIS — R972 Elevated prostate specific antigen [PSA]: Secondary | ICD-10-CM | POA: Diagnosis not present

## 2015-06-30 DIAGNOSIS — N403 Nodular prostate with lower urinary tract symptoms: Secondary | ICD-10-CM | POA: Diagnosis not present

## 2015-07-05 DIAGNOSIS — Z6830 Body mass index (BMI) 30.0-30.9, adult: Secondary | ICD-10-CM | POA: Diagnosis not present

## 2015-07-05 DIAGNOSIS — G4733 Obstructive sleep apnea (adult) (pediatric): Secondary | ICD-10-CM | POA: Diagnosis not present

## 2015-07-05 DIAGNOSIS — E7800 Pure hypercholesterolemia, unspecified: Secondary | ICD-10-CM | POA: Diagnosis not present

## 2015-07-05 DIAGNOSIS — I10 Essential (primary) hypertension: Secondary | ICD-10-CM | POA: Diagnosis not present

## 2015-07-16 DIAGNOSIS — L821 Other seborrheic keratosis: Secondary | ICD-10-CM | POA: Diagnosis not present

## 2015-07-16 DIAGNOSIS — Z85828 Personal history of other malignant neoplasm of skin: Secondary | ICD-10-CM | POA: Diagnosis not present

## 2015-09-10 DIAGNOSIS — Z85828 Personal history of other malignant neoplasm of skin: Secondary | ICD-10-CM | POA: Diagnosis not present

## 2015-09-10 DIAGNOSIS — L821 Other seborrheic keratosis: Secondary | ICD-10-CM | POA: Diagnosis not present

## 2015-10-18 ENCOUNTER — Ambulatory Visit (INDEPENDENT_AMBULATORY_CARE_PROVIDER_SITE_OTHER): Payer: Medicare Other | Admitting: Podiatry

## 2015-10-18 ENCOUNTER — Encounter: Payer: Self-pay | Admitting: Podiatry

## 2015-10-18 ENCOUNTER — Ambulatory Visit (INDEPENDENT_AMBULATORY_CARE_PROVIDER_SITE_OTHER): Payer: Medicare Other

## 2015-10-18 VITALS — BP 128/78 | HR 66 | Resp 16 | Ht 69.0 in | Wt 185.0 lb

## 2015-10-18 DIAGNOSIS — M2042 Other hammer toe(s) (acquired), left foot: Secondary | ICD-10-CM | POA: Diagnosis not present

## 2015-10-18 DIAGNOSIS — L84 Corns and callosities: Secondary | ICD-10-CM | POA: Diagnosis not present

## 2015-10-18 DIAGNOSIS — M779 Enthesopathy, unspecified: Secondary | ICD-10-CM | POA: Diagnosis not present

## 2015-10-18 DIAGNOSIS — M79675 Pain in left toe(s): Secondary | ICD-10-CM

## 2015-10-18 MED ORDER — TRIAMCINOLONE ACETONIDE 10 MG/ML IJ SUSP
10.0000 mg | Freq: Once | INTRAMUSCULAR | Status: AC
Start: 2015-10-18 — End: 2015-10-18
  Administered 2015-10-18: 10 mg

## 2015-10-18 NOTE — Progress Notes (Signed)
   Subjective:    Patient ID: Wesley [name], male    DOB: 1941-06-09, 75 y.o.   MRN: [id number]  HPI Patient presents with foot pain in their Left foot; medial side; x3 weeks.  Patient also presents with toe pain in their Left foot; Toes 2-5. Pt stated, "toes feel arthritic; sore joints"; x1 yr.   Review of Systems  HENT: Positive for sinus pressure.   Musculoskeletal: Positive for myalgias.  All other systems reviewed and are negative.      Objective:   Physical Exam        Assessment & Plan:

## 2015-10-20 NOTE — Progress Notes (Signed)
Subjective:     Patient ID: Wesley [name], male   DOB: 06/30/1941, 75 y.o.   MRN: [id number]  HPI patient states I developed pain on the inside of my foot recently and I have a lot of pain in my toes with keratotic lesion at the end of the third toe which becomes painful   Review of Systems  All other systems reviewed and are negative.      Objective:   Physical Exam  Constitutional: He is oriented to person, place, and time.  Cardiovascular: Intact distal pulses.   Musculoskeletal: Normal range of motion.  Neurological: He is oriented to person, place, and time.  Skin: Skin is warm.  Nursing note and vitals reviewed.  neurovascular status found to be intact muscle strength adequate range of motion within normal limits. 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 An right foot. Today I went ahead and I debrided distal lesion third toe left and applied buttress pad and did careful steroid injection 3 mg [place] some Kenalog 5 mg Xylocaine into the insertion of anterior tib left explaining the chances for rupture and being easy on it after the procedure and applied ankle fascial brace. Patient will be reviewed again in the next several weeks or earlier if needed  X-ray report indicates there is a relative cavus foot type with digital contracture left over right and rigid distal contracture of the toes

## 2016-01-27 DIAGNOSIS — L820 Inflamed seborrheic keratosis: Secondary | ICD-10-CM | POA: Diagnosis not present

## 2016-01-27 DIAGNOSIS — D485 Neoplasm of uncertain behavior of skin: Secondary | ICD-10-CM | POA: Diagnosis not present

## 2016-01-27 DIAGNOSIS — Z85828 Personal history of other malignant neoplasm of skin: Secondary | ICD-10-CM | POA: Diagnosis not present

## 2016-01-27 DIAGNOSIS — C44319 Basal cell carcinoma of skin of other parts of face: Secondary | ICD-10-CM | POA: Diagnosis not present

## 2016-02-17 DIAGNOSIS — E559 Vitamin D deficiency, unspecified: Secondary | ICD-10-CM | POA: Diagnosis not present

## 2016-02-17 DIAGNOSIS — Z0000 Encounter for general adult medical examination without abnormal findings: Secondary | ICD-10-CM | POA: Diagnosis not present

## 2016-02-17 DIAGNOSIS — I10 Essential (primary) hypertension: Secondary | ICD-10-CM | POA: Diagnosis not present

## 2016-02-17 DIAGNOSIS — E7800 Pure hypercholesterolemia, unspecified: Secondary | ICD-10-CM | POA: Diagnosis not present

## 2016-02-24 DIAGNOSIS — Z8739 Personal history of other diseases of the musculoskeletal system and connective tissue: Secondary | ICD-10-CM | POA: Diagnosis not present

## 2016-02-24 DIAGNOSIS — Z85828 Personal history of other malignant neoplasm of skin: Secondary | ICD-10-CM | POA: Diagnosis not present

## 2016-02-24 DIAGNOSIS — K429 Umbilical hernia without obstruction or gangrene: Secondary | ICD-10-CM | POA: Diagnosis not present

## 2016-02-24 DIAGNOSIS — I2510 Atherosclerotic heart disease of native coronary artery without angina pectoris: Secondary | ICD-10-CM | POA: Diagnosis not present

## 2016-02-24 DIAGNOSIS — E875 Hyperkalemia: Secondary | ICD-10-CM | POA: Diagnosis not present

## 2016-02-28 DIAGNOSIS — C44319 Basal cell carcinoma of skin of other parts of face: Secondary | ICD-10-CM | POA: Diagnosis not present

## 2016-02-28 DIAGNOSIS — Z85828 Personal history of other malignant neoplasm of skin: Secondary | ICD-10-CM | POA: Diagnosis not present

## 2016-03-10 ENCOUNTER — Encounter: Payer: Self-pay | Admitting: *Deleted

## 2016-03-27 DIAGNOSIS — G4733 Obstructive sleep apnea (adult) (pediatric): Secondary | ICD-10-CM | POA: Diagnosis not present

## 2016-03-29 ENCOUNTER — Encounter: Payer: Self-pay | Admitting: Cardiology

## 2016-03-29 ENCOUNTER — Encounter (INDEPENDENT_AMBULATORY_CARE_PROVIDER_SITE_OTHER): Payer: Self-pay

## 2016-03-29 ENCOUNTER — Ambulatory Visit (INDEPENDENT_AMBULATORY_CARE_PROVIDER_SITE_OTHER): Payer: Medicare Other | Admitting: Cardiology

## 2016-03-29 VITALS — BP 136/84 | HR 70 | Ht 67.0 in | Wt 190.4 lb

## 2016-03-29 DIAGNOSIS — E785 Hyperlipidemia, unspecified: Secondary | ICD-10-CM | POA: Diagnosis not present

## 2016-03-29 DIAGNOSIS — I2584 Coronary atherosclerosis due to calcified coronary lesion: Secondary | ICD-10-CM

## 2016-03-29 DIAGNOSIS — I2510 Atherosclerotic heart disease of native coronary artery without angina pectoris: Secondary | ICD-10-CM | POA: Diagnosis not present

## 2016-03-29 DIAGNOSIS — R9431 Abnormal electrocardiogram [ECG] [EKG]: Secondary | ICD-10-CM

## 2016-03-29 MED ORDER — ATORVASTATIN CALCIUM 80 MG PO TABS: 80.0000 mg | ORAL_TABLET | Freq: Every day | ORAL | 0 refills | Status: DC

## 2016-03-29 NOTE — Addendum Note (Signed)
Addended by: Shellia Cleverly on: 03/29/2016 05:52 PM   Modules accepted: Orders

## 2016-03-29 NOTE — Patient Instructions (Signed)
Medication Instructions:  Please increase your Atorvastatin to 80 mg a day. Continue all other medications as listed.  Testing/Procedures: Your physician has requested that you have a myoview. For further information please visit [email or web address]. Please follow instruction sheet, as given.  Follow-Up: Follow up will be based on the results of the above testing.  If you need a refill on your cardiac medications before your next appointment, please call your pharmacy.  Thank you for choosing [facility]!!

## 2016-03-29 NOTE — Progress Notes (Signed)
Cardiology Office Note    Date:  03/29/2016   ID:  [name], DOB 1941-01-30, MRN [id number]  PCP:  Horatio Pel, MD  Cardiologist:   Candee Furbish, MD   No chief complaint on file.   History of Present Illness:  Wesley [name] is a 75 y.o. male being referred for abnormal EKG at the request of Dr. Shelia Media.  In review of office note from 02/24/16 from Dr. Shelia Media, an echocardiogram in 2009 was negative, cardiac catheterization was negative in 2008, echocardiogram in 2015 showed mild LVH with EF of 60-65% and CT scans in 2015 showed coronary artery calcification but no aneurysm.  Father died at age 71 of CAD. Nonsmoker.  Because of the calcium noted in the coronary arteries Dr. Shelia Media noted that we need to be more aggressive in treating the cholesterol to keep the situation from progressing. He is on atorvastatin 40 mg and therefore Zetia 10 mg a day was added. He also had an EKG done which showed a change with new Q waves in V1 and V2. He requested a referral to discuss.  EKG from 02/24/16 personally reviewed showing sinus rhythm rate 69 with very small Q wave in V1 and very small Q wave in V2 with no other significant changes.  Overall he is not having any symptoms of angina, no chest pain, no shortness of breath, no syncope, no palpitations. His wife is currently sick with possible pneumonia.  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  Past Medical History:  Diagnosis Date  . 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 Chronic allergic rhinitis   . Coronary atherosclerosis of native coronary artery   . GERD (gastroesophageal reflux disease)   . High blood pressure   . Hypercholesterolemia   . Macrocytosis without anemia   . OSA on CPAP   . 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 Sleep apnea   . Umbilical hernia without obstruction or gangrene     Past Surgical History:    Procedure Laterality Date  . CARDIAC CATHETERIZATION  2000   Cath to rule out cardiac problems RT HTN. PT denies significant findings  . CARDIAC CATHETERIZATION  2008  . right rotator cuff    . ROTATOR CUFF REPAIR    . TONSILLECTOMY      Current Medications: Outpatient Medications Prior to Visit  Medication Sig Dispense Refill  . finasteride (PROSCAR) 5 MG tablet Take 5 mg by mouth every morning.    . fluticasone (FLONASE) 50 MCG/ACT nasal spray Place 2 sprays into both nostrils daily as needed for allergies or rhinitis.    Marland Kitchen ibuprofen (ADVIL,MOTRIN) 200 MG tablet Take 200 mg by mouth every 6 (six) hours as needed.    Marland Kitchen lisinopril (PRINIVIL,ZESTRIL) 10 MG tablet Take 10 mg by mouth every morning.    Marland Kitchen UNABLE TO FIND Pt takes Allertec, 10 mg, 1 tablet, every day    . Vitamin D, Ergocalciferol, (DRISDOL) 50000 units CAPS capsule     . aspirin 325 MG tablet Take 162.5 mg by mouth 2 (two) times daily.    Marland Kitchen atorvastatin (LIPITOR) 40 MG tablet Take 40 mg by mouth every morning.     No facility-administered medications prior to visit.      Allergies:   Demerol and Promethazine hcl   Social History   Social History  . Marital status: Married    Spouse  name: N/A  . Number of children: N/A  . Years of education: N/A   Social History Main Topics  . Smoking status: Never Smoker  . Smokeless tobacco: Never Used  . Alcohol use 7.0 oz/week    14 drink(s) per week     Comment: or more  . Drug use: No  . Sexual activity: Not Asked   Other Topics Concern  . None   Social History Narrative  . None   Art gallery manager.   Family History:  The patient's family history includes CAD in his father; Diabetes in his mother; Healthy in his daughter and son; Melanoma (age of onset: 62) in his brother.   ROS:   Please see the history of present illness.    ROS All other systems reviewed and are negative.   PHYSICAL EXAM:   VS:  BP 136/84   Pulse 70   Ht 5\' 7"  (1.702 m)   Wt 190 lb  6.4 oz (86.4 kg)   BMI 29.82 kg/m    GEN: Well nourished, well developed, in no acute distress  HEENT: normal  Neck: no JVD, carotid bruits, or masses Cardiac: RRR; no murmurs, rubs, or gallops,no edema  Respiratory:  clear to auscultation bilaterally, normal work of breathing GI: soft, nontender, nondistended, + BS MS: no deformity or atrophy  Skin: warm and dry, no rash Neuro:  Alert and Oriented x 3, Strength and sensation are intact Psych: euthymic mood, full affect  Wt Readings from Last 3 Encounters:  03/29/16 190 lb 6.4 oz (86.4 kg)  10/18/15 185 lb (83.9 kg)  08/12/13 189 lb 4.8 oz (85.9 kg)      Studies/Labs Reviewed:   EKG:  EKG is ordered today.  The ekg ordered today demonstrates Sinus rhythm, 70, incomplete right bundle-branch block, small nonpathologic Q-wave in V1, no Q wave in V2. Prior EKG as described above.  Recent Labs: No results found for requested labs within last 8760 hours.   Lipid Panel No results found for: CHOL, TRIG, HDL, CHOLHDL, VLDL, LDLCALC, LDLDIRECT  Additional studies/ records that were reviewed today include:  Prior medical records, EKG, lab work reviewed. LDLs have been running between 99 and 110.    ASSESSMENT:    1. Coronary arteriosclerosis   2. Coronary artery calcification   3. Hyperlipidemia   4. Abnormal ECG      PLAN:  In order of problems listed above:  Coronary artery calcification/atherosclerosis/aortic atherosclerosis  - Showed him CT scan, diffuse LAD calcification, mild proximal circumflex calcification.  - Prior heart catheterization in 2008 was reportedly normal. Since it is been approximately 10 years since last evaluation, we will order a nuclear stress test to see if he has any evidence of ischemia especially in the LAD territory.  - Agree with aggressive primary prevention, aspirin 81 mg.  Hyperlipidemia  - He has been prescribed Zetia 10 mg which is quite expensive for him, he is splitting the pill. I  will also send him a prescription for atorvastatin 80 mg once a day. If he is not having any symptoms with the 40 mg, he should be able to tolerate the 80 mg well. He is going to be seeing Dr. Shelia Media soon for blood work.  Abnormal EKG  - Referring EKG demonstrated Q waves in V1 and V2. Most recent EKG shows only a small nonpathologic Q-wave in V1. Otherwise unremarkable. Reassurance. Most recent echocardiogram in 2015 showed normal ejection fraction with mild left ventricular hypertrophy. Continue with good blood pressure  control.    Medication Adjustments/Labs and Tests Ordered: Current medicines are reviewed at length with the patient today.  Concerns regarding medicines are outlined above.  Medication changes, Labs and Tests ordered today are listed in the Patient Instructions below. Patient Instructions  Medication Instructions:  Please increase your Atorvastatin to 80 mg a day. Continue all other medications as listed.  Testing/Procedures: Your physician has requested that you have a myoview. For further information please visit [email or web address]. Please follow instruction sheet, as given.  Follow-Up: Follow up will be based on the results of the above testing.  If you need a refill on your cardiac medications before your next appointment, please call your pharmacy.  Thank you for choosing [facility]!!        Signed, Candee Furbish, MD  03/29/2016 12:16 PM    [address], [place], [place]  [address] Phone: [phone]; Fax: [phone]

## 2016-03-30 ENCOUNTER — Telehealth (HOSPITAL_COMMUNITY): Payer: Self-pay | Admitting: Radiology

## 2016-03-30 NOTE — Telephone Encounter (Signed)
Patient given detailed instructions per Myocardial Perfusion Study Information Sheet for the test on8/22/17 at 745. Patient notified to arrive 15 minutes early and that it is imperative to arrive on time for appointment to keep from having the test rescheduled.  If you need to cancel or reschedule your appointment, please call the office within 24 hours of your appointment. Failure to do so may result in a cancellation of your appointment, and a $50 no show fee. Patient verbalized understanding.Wesley [name]

## 2016-04-04 ENCOUNTER — Encounter (HOSPITAL_COMMUNITY): Payer: Medicare Other

## 2016-04-06 ENCOUNTER — Telehealth (HOSPITAL_COMMUNITY): Payer: Self-pay | Admitting: *Deleted

## 2016-04-06 NOTE — Telephone Encounter (Signed)
Patient given detailed instructions per Myocardial Perfusion Study Information Sheet for the test on 04/06/16. Patient notified to arrive 15 minutes early and that it is imperative to arrive on time for appointment to keep from having the test rescheduled.  If you need to cancel or reschedule your appointment, please call the office within 24 hours of your appointment. Failure to do so may result in a cancellation of your appointment, and a $50 no show fee. Patient verbalized understanding. Hubbard Robinson, RN

## 2016-04-11 ENCOUNTER — Ambulatory Visit (HOSPITAL_COMMUNITY): Payer: Medicare Other | Attending: Cardiology

## 2016-04-11 DIAGNOSIS — I10 Essential (primary) hypertension: Secondary | ICD-10-CM | POA: Insufficient documentation

## 2016-04-11 DIAGNOSIS — Z8249 Family history of ischemic heart disease and other diseases of the circulatory system: Secondary | ICD-10-CM | POA: Insufficient documentation

## 2016-04-11 DIAGNOSIS — I2510 Atherosclerotic heart disease of native coronary artery without angina pectoris: Secondary | ICD-10-CM | POA: Diagnosis not present

## 2016-04-11 DIAGNOSIS — I4519 Other right bundle-branch block: Secondary | ICD-10-CM | POA: Diagnosis not present

## 2016-04-11 LAB — MYOCARDIAL PERFUSION IMAGING
CHL CUP NUCLEAR SDS: 5
CHL CUP NUCLEAR SRS: 1
CHL CUP NUCLEAR SSS: 6
CSEPEDS: 0 s
CSEPEW: 7 METS
CSEPHR: 109 %
CSEPPHR: 160 {beats}/min
Exercise duration (min): 6 min
LV dias vol: 90 mL (ref 62–150)
LVSYSVOL: 29 mL
MPHR: 146 {beats}/min
RATE: 0.24
Rest HR: 66 {beats}/min
TID: 0.92

## 2016-04-11 IMAGING — NM NM MISC PROCEDURE
9 series · 54 of 54 positions shown · non-contrast
Comparison: none

[Series 1: rest · 6.51mm/px · 6 of 64 frames shown]
[frame 6/64]
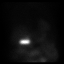
[frame 16/64]
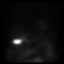
[frame 27/64]
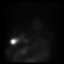
[frame 38/64]
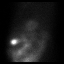
[frame 48/64]
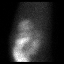
[frame 59/64]
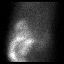

[Series 1: wbr_r-proj_st rest_(id)_sa · 6.5mm · 6.51mm/px · 6 of 64 frames shown]
[frame 6/64]
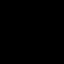
[frame 16/64]
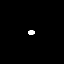
[frame 27/64]
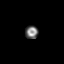
[frame 38/64]
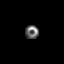
[frame 48/64]
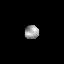
[frame 59/64]
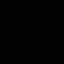

[Series 1: wbr_s-proj_st stress_(id)_sa · 6.5mm · 6.51mm/px · 6 of 64 frames shown (1 of 2)]
[frame 6/64]
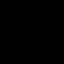
[frame 16/64]
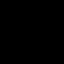
[frame 27/64]
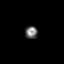
[frame 38/64]
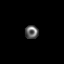
[frame 48/64]
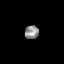
[frame 59/64]
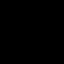

[Series 1: wbr_r-proj_st rest · 6.51mm/px · 6 of 64 frames shown]
[frame 6/64]
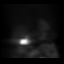
[frame 16/64]
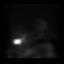
[frame 27/64]
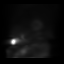
[frame 38/64]
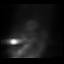
[frame 48/64]
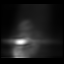
[frame 59/64]
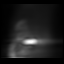

[Series 1: wbr_s-proj_st stress_(id)_sa · 6.5mm · 6.51mm/px · 6 of 512 frames shown (2 of 2)]
[frame 43/512]
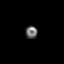
[frame 128/512]
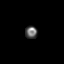
[frame 214/512]
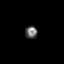
[frame 299/512]
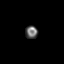
[frame 384/512]
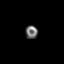
[frame 470/512]
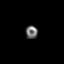

[Series 2: wbr_s-proj_st stress · 6.51mm/px · 6 of 64 frames shown (1 of 2)]
[frame 6/64]
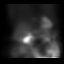
[frame 16/64]
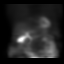
[frame 27/64]
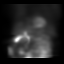
[frame 38/64]
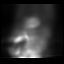
[frame 48/64]
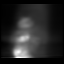
[frame 59/64]
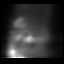

[Series 2: stress · 6.51mm/px · 6 of 64 frames shown (1 of 2)]
[frame 6/64]
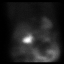
[frame 16/64]
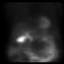
[frame 27/64]
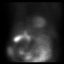
[frame 38/64]
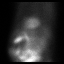
[frame 48/64]
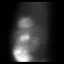
[frame 59/64]
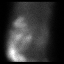

[Series 2: stress · 6.51mm/px · 6 of 512 frames shown (2 of 2)]
[frame 43/512]
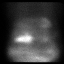
[frame 128/512]
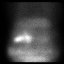
[frame 214/512]
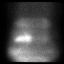
[frame 299/512]
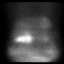
[frame 384/512]
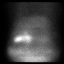
[frame 470/512]
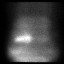

[Series 2: wbr_s-proj_st stress · 6.51mm/px · 6 of 512 frames shown (2 of 2)]
[frame 43/512]
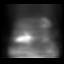
[frame 128/512]
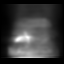
[frame 214/512]
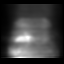
[frame 299/512]
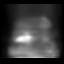
[frame 384/512]
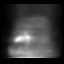
[frame 470/512]
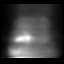

[54 of 54 positions shown; findings below may reference images not displayed]

Canned report from images found in remote index.

Refer to host system for actual result text.

## 2016-04-11 MED ORDER — TECHNETIUM TC 99M TETROFOSMIN IV KIT
32.3000 | PACK | Freq: Once | INTRAVENOUS | Status: AC | PRN
  Administered 2016-04-11: 32.3 via INTRAVENOUS
  Filled 2016-04-11: qty 32

## 2016-04-11 MED ORDER — TECHNETIUM TC 99M TETROFOSMIN IV KIT
10.2000 | PACK | Freq: Once | INTRAVENOUS | Status: AC | PRN
  Administered 2016-04-11: 10 via INTRAVENOUS
  Filled 2016-04-11: qty 10

## 2016-04-12 ENCOUNTER — Telehealth: Payer: Self-pay | Admitting: *Deleted

## 2016-04-12 NOTE — Telephone Encounter (Signed)
Called pt to review myoview results. He is questioning if he needs to continue on Zetia since his atorvastatin was increased to 80 mg a day at his last office visit.  Advised I will review with Dr Marlou Porch and call him back.  He is having blood work soon at Dr [employer] office.

## 2016-04-12 NOTE — Telephone Encounter (Signed)
Let's stop the Zetia 10 mg (which he has been splitting to 5 mg due to cost) and recheck lipids in 3 months. Candee Furbish, MD

## 2016-04-13 NOTE — Telephone Encounter (Signed)
Left message for pt to stop Zetia for now per Dr Marlou Porch.  Requested he call back with any questions or concerns.

## 2016-04-14 DIAGNOSIS — I10 Essential (primary) hypertension: Secondary | ICD-10-CM | POA: Diagnosis not present

## 2016-04-14 DIAGNOSIS — I2510 Atherosclerotic heart disease of native coronary artery without angina pectoris: Secondary | ICD-10-CM | POA: Diagnosis not present

## 2016-04-14 DIAGNOSIS — E7800 Pure hypercholesterolemia, unspecified: Secondary | ICD-10-CM | POA: Diagnosis not present

## 2016-04-18 DIAGNOSIS — E7800 Pure hypercholesterolemia, unspecified: Secondary | ICD-10-CM | POA: Diagnosis not present

## 2016-04-18 DIAGNOSIS — M25512 Pain in left shoulder: Secondary | ICD-10-CM | POA: Diagnosis not present

## 2016-04-18 DIAGNOSIS — M25511 Pain in right shoulder: Secondary | ICD-10-CM | POA: Diagnosis not present

## 2016-04-18 DIAGNOSIS — Z23 Encounter for immunization: Secondary | ICD-10-CM | POA: Diagnosis not present

## 2016-04-24 DIAGNOSIS — K429 Umbilical hernia without obstruction or gangrene: Secondary | ICD-10-CM | POA: Diagnosis not present

## 2016-06-19 ENCOUNTER — Ambulatory Visit: Payer: Self-pay | Admitting: General Surgery

## 2016-06-19 NOTE — H&P (Signed)
Wesley [name] 04/24/2016 2:13 PM Location: [facility] Patient #: [id number] DOB: 1940/11/11 Married / Language: Cleophus Molt / Race: White Male   History of Present Illness Odis Hollingshead MD; 04/24/2016 2:58 PM) Patient words: new pt, unbilical hernia.  The patient is a 75 year old male.  Note:He is referred by Dr. Shelia Media consultation regarding a intermittently symptomatic umbilical hernia. He is noticed this over the past year. Sometimes it causes him some discomfort. He goes to the [facility] and exercises every day. His exercises involve some resistance training as well as core exercises. No difficulty with urination or constipation. He is interested in discussing repair.  Other Problems (April Staton, CMA; 04/24/2016 2:13 PM) Arthritis Cancer Enlarged Prostate High blood pressure Sleep Apnea Umbilical Hernia Repair  Past Surgical History (April Staton, CMA; 04/24/2016 2:13 PM) Cataract Surgery Bilateral. Colon Polyp Removal - Colonoscopy Shoulder Surgery Right.  Diagnostic Studies History (April Staton, [place]; 04/24/2016 2:13 PM) Colonoscopy 5-10 years ago  Allergies (April Staton, CMA; 04/24/2016 2:15 PM) Demerol *ANALGESICS - OPIOID* Phenergan *ANTIHISTAMINES* Promethazine HCl *ANTIHISTAMINES*  Medication History (April Staton, CMA; 04/24/2016 2:18 PM) Claritin (10MG  Capsule, Oral as needed) Active. Finasteride (5MG  Tablet, Oral) Active. Flonase (50MCG/ACT Suspension, Nasal as needed) Active. Lipitor (80MG  Tablet, Oral) Active. Lisinopril (10MG  Tablet, Oral) Active. Vitamin D (50000UNIT Capsule, Oral) Active. Omega 3 (1200MG  Capsule, Oral) Active. Aspirin (81MG  Tablet, Oral) Active. Atorvastatin Calcium (80MG  Tablet, Oral) Active. Medications Reconciled  Social History (April Staton, [place]; 04/24/2016 2:13 PM) Alcohol use Moderate alcohol use. Caffeine use Coffee. No drug use Tobacco use Never smoker.  Family History (April  Staton, [place]; 04/24/2016 2:13 PM) Cerebrovascular Accident Brother. Diabetes Mellitus Mother. Heart Disease Father. Heart disease in male family member before age 44 Melanoma Brother.    Review of Systems (April Staton CMA; 04/24/2016 2:13 PM) General Not Present- Appetite Loss, Chills, Fatigue, Fever, Night Sweats, Weight Gain and Weight Loss. Skin Not Present- Change in Wart/Mole, Dryness, Hives, Jaundice, New Lesions, Non-Healing Wounds, Rash and Ulcer. HEENT Present- Seasonal Allergies. Not Present- Earache, Hearing Loss, Hoarseness, Nose Bleed, Oral Ulcers, Ringing in the Ears, Sinus Pain, Sore Throat, Visual Disturbances, Wears glasses/contact lenses and Yellow Eyes. Respiratory Not Present- Bloody sputum, Chronic Cough, Difficulty Breathing, Snoring and Wheezing. Breast Not Present- Breast Mass, Breast Pain, Nipple Discharge and Skin Changes. Cardiovascular Not Present- Chest Pain, Difficulty Breathing Lying Down, Leg Cramps, Palpitations, Rapid Heart Rate, Shortness of Breath and Swelling of Extremities. Gastrointestinal Not Present- Abdominal Pain, Bloating, Bloody Stool, Change in Bowel Habits, Chronic diarrhea, Constipation, Difficulty Swallowing, Excessive gas, Gets full quickly at meals, Hemorrhoids, Indigestion, Nausea, Rectal Pain and Vomiting. Male Genitourinary Not Present- Blood in Urine, Change in Urinary Stream, Frequency, Impotence, Nocturia, Painful Urination, Urgency and Urine Leakage. Musculoskeletal Not Present- Back Pain, Joint Pain, Joint Stiffness, Muscle Pain, Muscle Weakness and Swelling of Extremities. Neurological Not Present- Decreased Memory, Fainting, Headaches, Numbness, Seizures, Tingling, Tremor, Trouble walking and Weakness. Psychiatric Not Present- Anxiety, Bipolar, Change in Sleep Pattern, Depression, Fearful and Frequent crying. Endocrine Not Present- Cold Intolerance, Excessive Hunger, Hair Changes, Heat Intolerance, Hot flashes and New  Diabetes. Hematology Not Present- Blood Thinners, Easy Bruising, Excessive bleeding, Gland problems, HIV and Persistent Infections.  Vitals (April Staton CMA; 04/24/2016 2:18 PM) 04/24/2016 2:18 PM Weight: 190.13 lb Height: 68in Height was reported by patient. Body Surface Area: 2 m Body Mass Index: 28.91 kg/m  Temp.: 99F(Oral)  Pulse: 81 (Regular)  P.OX: 97% (Room air) BP: 118/88 (Sitting, Left Arm, Standard)  Physical  Exam Odis Hollingshead MD; 04/24/2016 2:59 PM) The physical exam findings are as follows: Note:General: Overweight male in NAD. Pleasant and cooperative.  HEENT: [place]/AT, no external nasal or ear masses, mucous membranes are moist  EYES: no scleral icterus  CV: RRR, no murmur, no edema  CHEST: Breath sounds equal and clear. Respirations nonlabored.  ABDOMEN: Soft, nontender, nondistended, reducible umbilical bulge with 123456 cm palpable fascial defect.  GU: No bulges.  SKIN: No jaundice.  NEUROLOGIC: Alert and oriented, answers questions appropriately, normal gait and station.  PSYCHIATRIC: Normal mood, affect , and behavior.   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 He is interested in repair sometime later this year early next year.  Plan: Open umbilical hernia repair with mesh. I asked him to call back when he would like to schedule the surgery. I have discussed the procedure, risks, and aftercare. Risks include but are not limited to bleeding, infection, wound healing problems, anesthesia, recurrence, injury to intra-abdominal organs, cosmetic deformity. He seems to understand.  Jackolyn Confer, M.D.

## 2016-07-03 DIAGNOSIS — R972 Elevated prostate specific antigen [PSA]: Secondary | ICD-10-CM | POA: Diagnosis not present

## 2016-07-03 DIAGNOSIS — R351 Nocturia: Secondary | ICD-10-CM | POA: Diagnosis not present

## 2016-07-03 DIAGNOSIS — N403 Nodular prostate with lower urinary tract symptoms: Secondary | ICD-10-CM | POA: Diagnosis not present

## 2016-08-10 DIAGNOSIS — B9789 Other viral agents as the cause of diseases classified elsewhere: Secondary | ICD-10-CM | POA: Diagnosis not present

## 2016-08-10 DIAGNOSIS — J069 Acute upper respiratory infection, unspecified: Secondary | ICD-10-CM | POA: Diagnosis not present

## 2016-08-15 NOTE — Pre-Procedure Instructions (Signed)
Wesley [name]  08/15/2016      [place], [place] [address] Phone: [phone] Fax: [phone]    Your procedure is scheduled on Tuesday January 9.  Report to [facility] Admitting at 9:00 A.M.  Call this number if you have problems the morning of surgery:  [phone]   Remember:  Do not eat food or drink liquids after midnight.  Take these medicines the morning of surgery with A SIP OF WATER: cetirizine (zyrtec), finesteride (proscar), flonase if needed  7 days prior to surgery STOP taking any Aspirin, Aleve, Naproxen, Ibuprofen, Motrin, Advil, Goody's, BC's, all herbal medications, fish oil, and all vitamins    Do not wear jewelry, make-up or nail polish.  Do not wear lotions, powders, or perfumes, or deoderant.  Do not shave 48 hours prior to surgery.  Men may shave face and neck.  Do not bring valuables to the hospital.  [facility] is not responsible for any belongings or valuables.  Contacts, dentures or bridgework may not be worn into surgery.  Leave your suitcase in the car.  After surgery it may be brought to your room.  For patients admitted to the hospital, discharge time will be determined by your treatment team.  Patients discharged the day of surgery will not be allowed to drive home.   Special instructions:    [place]- Preparing For Surgery  Before surgery, you can play an important role. Because skin is not sterile, your skin needs to be as free of germs as possible. You can reduce the number of germs on your skin by washing with CHG (chlorahexidine gluconate) Soap before surgery.  CHG is an antiseptic cleaner which kills germs and bonds with the skin to continue killing germs even after washing.  Please do not use if you have an allergy to CHG or antibacterial soaps. If your skin becomes reddened/irritated stop using the CHG.  Do not shave (including  legs and underarms) for at least 48 hours prior to first CHG shower. It is OK to shave your face.  Please follow these instructions carefully.   1. Shower the NIGHT BEFORE SURGERY and the MORNING OF SURGERY with CHG.   2. If you chose to wash your hair, wash your hair first as usual with your normal shampoo.  3. After you shampoo, rinse your hair and body thoroughly to remove the shampoo.  4. Use CHG as you would any other liquid soap. You can apply CHG directly to the skin and wash gently with a scrungie or a clean washcloth.   5. Apply the CHG Soap to your body ONLY FROM THE NECK DOWN.  Do not use on open wounds or open sores. Avoid contact with your eyes, ears, mouth and genitals (private parts). Wash genitals (private parts) with your normal soap.  6. Wash thoroughly, paying special attention to the area where your surgery will be performed.  7. Thoroughly rinse your body with warm water from the neck down.  8. DO NOT shower/wash with your normal soap after using and rinsing off the CHG Soap.  9. Pat yourself dry with a CLEAN TOWEL.   10. Wear CLEAN PAJAMAS   11. Place CLEAN SHEETS on your bed the night of your first shower and DO NOT SLEEP WITH PETS.    Day of Surgery: Do not apply any deodorants/lotions. Please wear clean clothes to the hospital/surgery center.

## 2016-08-16 ENCOUNTER — Inpatient Hospital Stay (HOSPITAL_COMMUNITY)
Admission: RE | Admit: 2016-08-16 | Discharge: 2016-08-16 | Disposition: A | Discharge status: Home / Self Care | Payer: Medicare Other | Source: Ambulatory Visit

## 2016-08-21 ENCOUNTER — Other Ambulatory Visit: Payer: Self-pay | Admitting: Cardiology

## 2016-08-21 ENCOUNTER — Other Ambulatory Visit: Payer: Self-pay | Admitting: *Deleted

## 2016-08-21 MED ORDER — ATORVASTATIN CALCIUM 80 MG PO TABS: 80.0000 mg | ORAL_TABLET | Freq: Every day | ORAL | 0 refills | Status: DC

## 2016-08-21 NOTE — Telephone Encounter (Signed)
One thirty day rx per patients request as he will be getting this through a mail order pharmacy.

## 2016-09-04 NOTE — Patient Instructions (Addendum)
ROMERO GIUNTA  09/04/2016   Your procedure is scheduled on: Friday 09/08/2016  Report to [facility]  Entrance take [place]  elevators to 3rd floor to  [place] at   [place]  AM.  Call this number if you have problems the morning of surgery [phone]   Remember: ONLY 1 PERSON MAY GO WITH YOU TO SHORT STAY TO GET  READY MORNING OF [place].   Do not eat food or drink liquids :After Midnight.   PLEASE BRING YOUR CPAP MASK AND TUBING TO HOSPITAL  MORNING OF SURGERY!   Take these medicines the morning of surgery with A SIP OF WATER: Zyrtec, Finasteride                                 You may not have any metal on your body including hair pins and              piercings  Do not wear jewelry, make-up, lotions, powders or perfumes, deodorant             Do not wear nail polish.  Do not shave  48 hours prior to surgery.              Men may shave face and neck.   Do not bring valuables to the hospital. [place].  Contacts, dentures or bridgework may not be worn into surgery.  Leave suitcase in the car. After surgery it may be brought to your room.     Patients discharged the day of surgery will not be allowed to drive home.  Name and phone number of your driver:spouse-Claudia   Special Instructions: N/A              Please read over the following fact sheets you were given: _____________________________________________________________________             [facility] - Preparing for Surgery Before surgery, you can play an important role.  Because skin is not sterile, your skin needs to be as free of germs as possible.  You can reduce the number of germs on your skin by washing with CHG (chlorahexidine gluconate) soap before surgery.  CHG is an antiseptic cleaner which kills germs and bonds with the skin to continue killing germs even after washing. Please DO NOT use if you have an allergy to  CHG or antibacterial soaps.  If your skin becomes reddened/irritated stop using the CHG and inform your nurse when you arrive at Short Stay. Do not shave (including legs and underarms) for at least 48 hours prior to the first CHG shower.  You may shave your face/neck. Please follow these instructions carefully:  1.  Shower with CHG Soap the night before surgery and the  morning of Surgery.  2.  If you choose to wash your hair, wash your hair first as usual with your  normal  shampoo.  3.  After you shampoo, rinse your hair and body thoroughly to remove the  shampoo.                           4.  Use CHG as you would any other liquid soap.  You  can apply chg directly  to the skin and wash                       Gently with a scrungie or clean washcloth.  5.  Apply the CHG Soap to your body ONLY FROM THE NECK DOWN.   Do not use on face/ open                           Wound or open sores. Avoid contact with eyes, ears mouth and genitals (private parts).                       Wash face,  Genitals (private parts) with your normal soap.             6.  Wash thoroughly, paying special attention to the area where your surgery  will be performed.  7.  Thoroughly rinse your body with warm water from the neck down.  8.  DO NOT shower/wash with your normal soap after using and rinsing off  the CHG Soap.                9.  Pat yourself dry with a clean towel.            10.  Wear clean pajamas.            11.  Place clean sheets on your bed the night of your first shower and do not  sleep with pets. Day of Surgery : Do not apply any lotions/deodorants the morning of surgery.  Please wear clean clothes to the hospital/surgery center.  FAILURE TO FOLLOW THESE INSTRUCTIONS MAY RESULT IN THE CANCELLATION OF YOUR SURGERY PATIENT SIGNATURE_________________________________  NURSE SIGNATURE__________________________________  ________________________________________________________________________

## 2016-09-05 DIAGNOSIS — L570 Actinic keratosis: Secondary | ICD-10-CM | POA: Diagnosis not present

## 2016-09-05 DIAGNOSIS — L821 Other seborrheic keratosis: Secondary | ICD-10-CM | POA: Diagnosis not present

## 2016-09-05 DIAGNOSIS — Z85828 Personal history of other malignant neoplasm of skin: Secondary | ICD-10-CM | POA: Diagnosis not present

## 2016-09-05 DIAGNOSIS — D2271 Melanocytic nevi of right lower limb, including hip: Secondary | ICD-10-CM | POA: Diagnosis not present

## 2016-09-05 DIAGNOSIS — D1801 Hemangioma of skin and subcutaneous tissue: Secondary | ICD-10-CM | POA: Diagnosis not present

## 2016-09-06 ENCOUNTER — Encounter (HOSPITAL_COMMUNITY): Payer: Self-pay

## 2016-09-06 ENCOUNTER — Encounter (HOSPITAL_COMMUNITY)
Admission: RE | Admit: 2016-09-06 | Discharge: 2016-09-06 | Disposition: A | Discharge status: Home / Self Care | Payer: Medicare Other | Source: Ambulatory Visit | Attending: General Surgery | Admitting: General Surgery

## 2016-09-06 DIAGNOSIS — I10 Essential (primary) hypertension: Secondary | ICD-10-CM | POA: Diagnosis not present

## 2016-09-06 DIAGNOSIS — K429 Umbilical hernia without obstruction or gangrene: Secondary | ICD-10-CM | POA: Diagnosis not present

## 2016-09-06 DIAGNOSIS — I2510 Atherosclerotic heart disease of native coronary artery without angina pectoris: Secondary | ICD-10-CM | POA: Diagnosis not present

## 2016-09-06 DIAGNOSIS — G4730 Sleep apnea, unspecified: Secondary | ICD-10-CM | POA: Diagnosis not present

## 2016-09-06 DIAGNOSIS — Z7982 Long term (current) use of aspirin: Secondary | ICD-10-CM | POA: Diagnosis not present

## 2016-09-06 DIAGNOSIS — Z79899 Other long term (current) drug therapy: Secondary | ICD-10-CM | POA: Diagnosis not present

## 2016-09-06 LAB — CBC
HCT: 42.4 % (ref 39.0–52.0)
HEMOGLOBIN: 14.2 g/dL (ref 13.0–17.0)
MCH: 32.8 pg (ref 26.0–34.0)
MCHC: 33.5 g/dL (ref 30.0–36.0)
MCV: 97.9 fL (ref 78.0–100.0)
Platelets: 212 10*3/uL (ref 150–400)
RBC: 4.33 MIL/uL (ref 4.22–5.81)
RDW: 13.5 % (ref 11.5–15.5)
WBC: 6.6 10*3/uL (ref 4.0–10.5)

## 2016-09-06 LAB — BASIC METABOLIC PANEL
ANION GAP: 5 (ref 5–15)
BUN: 17 mg/dL (ref 6–20)
CALCIUM: 8.9 mg/dL (ref 8.9–10.3)
CO2: 28 mmol/L (ref 22–32)
Chloride: 103 mmol/L (ref 101–111)
Creatinine, Ser: 0.86 mg/dL (ref 0.61–1.24)
GFR calc Af Amer: >60 mL/min (ref >60)
GLUCOSE: 103 mg/dL — AB (ref 65–99)
POTASSIUM: 4.4 mmol/L (ref 3.5–5.1)
SODIUM: 136 mmol/L (ref 135–145)

## 2016-09-06 NOTE — Progress Notes (Signed)
03/29/2016- noted in EPIC- EKG 04/11/2016- noted in EPIC- stress test

## 2016-09-08 ENCOUNTER — Encounter (HOSPITAL_COMMUNITY)
Admission: RE | Disposition: A | Discharge status: Home / Self Care | Payer: Self-pay | Source: Ambulatory Visit | Attending: General Surgery

## 2016-09-08 ENCOUNTER — Encounter (HOSPITAL_COMMUNITY): Payer: Self-pay | Admitting: *Deleted

## 2016-09-08 ENCOUNTER — Ambulatory Visit (HOSPITAL_COMMUNITY): Payer: Medicare Other | Admitting: Certified Registered Nurse Anesthetist

## 2016-09-08 ENCOUNTER — Ambulatory Visit (HOSPITAL_COMMUNITY)
Admission: RE | Admit: 2016-09-08 | Discharge: 2016-09-08 | Disposition: A | Discharge status: Home / Self Care | Payer: Medicare Other | Source: Ambulatory Visit | Attending: General Surgery | Admitting: General Surgery

## 2016-09-08 DIAGNOSIS — I2510 Atherosclerotic heart disease of native coronary artery without angina pectoris: Secondary | ICD-10-CM | POA: Diagnosis not present

## 2016-09-08 DIAGNOSIS — K429 Umbilical hernia without obstruction or gangrene: Secondary | ICD-10-CM | POA: Diagnosis not present

## 2016-09-08 DIAGNOSIS — Z79899 Other long term (current) drug therapy: Secondary | ICD-10-CM | POA: Insufficient documentation

## 2016-09-08 DIAGNOSIS — Z7982 Long term (current) use of aspirin: Secondary | ICD-10-CM | POA: Insufficient documentation

## 2016-09-08 DIAGNOSIS — G4730 Sleep apnea, unspecified: Secondary | ICD-10-CM | POA: Diagnosis not present

## 2016-09-08 DIAGNOSIS — I10 Essential (primary) hypertension: Secondary | ICD-10-CM | POA: Diagnosis not present

## 2016-09-08 DIAGNOSIS — M549 Dorsalgia, unspecified: Secondary | ICD-10-CM | POA: Diagnosis not present

## 2016-09-08 HISTORY — PX: INSERTION OF MESH: SHX5868

## 2016-09-08 HISTORY — PX: UMBILICAL HERNIA REPAIR: SHX196

## 2016-09-08 SURGERY — REPAIR, HERNIA, UMBILICAL, ADULT
Anesthesia: General | Site: Abdomen

## 2016-09-08 MED ORDER — PROPOFOL 10 MG/ML IV BOLUS
INTRAVENOUS | Status: DC | PRN
  Administered 2016-09-08: 200 mg via INTRAVENOUS
  Administered 2016-09-08: 10 mg via INTRAVENOUS

## 2016-09-08 MED ORDER — CEFAZOLIN SODIUM-DEXTROSE 2-4 GM/100ML-% IV SOLN
2.0000 g | INTRAVENOUS | Status: AC
  Administered 2016-09-08: 2 g via INTRAVENOUS
  Filled 2016-09-08: qty 100

## 2016-09-08 MED ORDER — FENTANYL CITRATE (PF) 100 MCG/2ML IJ SOLN
INTRAMUSCULAR | Status: AC
  Filled 2016-09-08: qty 2

## 2016-09-08 MED ORDER — OXYCODONE HCL 5 MG PO TABS: 5.0000 mg | ORAL_TABLET | ORAL | 0 refills | Status: DC | PRN

## 2016-09-08 MED ORDER — OXYCODONE HCL 5 MG PO TABS
5.0000 mg | ORAL_TABLET | ORAL | Status: DC | PRN
  Administered 2016-09-08: 5 mg via ORAL
  Filled 2016-09-08: qty 1

## 2016-09-08 MED ORDER — SUGAMMADEX SODIUM 500 MG/5ML IV SOLN
INTRAVENOUS | Status: AC
  Filled 2016-09-08: qty 5

## 2016-09-08 MED ORDER — DEXAMETHASONE SODIUM PHOSPHATE 10 MG/ML IJ SOLN
INTRAMUSCULAR | Status: AC
  Filled 2016-09-08: qty 1

## 2016-09-08 MED ORDER — SUCCINYLCHOLINE CHLORIDE 200 MG/10ML IV SOSY
PREFILLED_SYRINGE | INTRAVENOUS | Status: DC | PRN
  Administered 2016-09-08: 100 mg via INTRAVENOUS

## 2016-09-08 MED ORDER — ROCURONIUM BROMIDE 50 MG/5ML IV SOSY
PREFILLED_SYRINGE | INTRAVENOUS | Status: DC | PRN
  Administered 2016-09-08: 35 mg via INTRAVENOUS

## 2016-09-08 MED ORDER — ACETAMINOPHEN 650 MG RE SUPP
650.0000 mg | RECTAL | Status: DC | PRN
  Filled 2016-09-08: qty 1

## 2016-09-08 MED ORDER — PROPOFOL 10 MG/ML IV BOLUS
INTRAVENOUS | Status: AC
  Filled 2016-09-08: qty 20

## 2016-09-08 MED ORDER — EPHEDRINE SULFATE 50 MG/ML IJ SOLN
INTRAMUSCULAR | Status: DC | PRN
  Administered 2016-09-08: 5 mg via INTRAVENOUS

## 2016-09-08 MED ORDER — SUGAMMADEX SODIUM 200 MG/2ML IV SOLN
INTRAVENOUS | Status: DC | PRN
  Administered 2016-09-08: 400 mg via INTRAVENOUS

## 2016-09-08 MED ORDER — ONDANSETRON HCL 4 MG/2ML IJ SOLN
INTRAMUSCULAR | Status: DC | PRN
  Administered 2016-09-08: 4 mg via INTRAVENOUS

## 2016-09-08 MED ORDER — FENTANYL CITRATE (PF) 100 MCG/2ML IJ SOLN
25.0000 ug | INTRAMUSCULAR | Status: DC | PRN
  Administered 2016-09-08: 50 ug via INTRAVENOUS

## 2016-09-08 MED ORDER — LACTATED RINGERS IV SOLN
INTRAVENOUS | Status: DC
  Administered 2016-09-08: 1000 mL via INTRAVENOUS

## 2016-09-08 MED ORDER — DEXAMETHASONE SODIUM PHOSPHATE 10 MG/ML IJ SOLN
INTRAMUSCULAR | Status: DC | PRN
  Administered 2016-09-08: 10 mg via INTRAVENOUS

## 2016-09-08 MED ORDER — SODIUM CHLORIDE 0.9% FLUSH: 3.0000 mL | INTRAVENOUS | Status: DC | PRN

## 2016-09-08 MED ORDER — CHLORHEXIDINE GLUCONATE CLOTH 2 % EX PADS: 6.0000 | MEDICATED_PAD | Freq: Once | CUTANEOUS | Status: DC

## 2016-09-08 MED ORDER — LIDOCAINE 2% (20 MG/ML) 5 ML SYRINGE
INTRAMUSCULAR | Status: AC
  Filled 2016-09-08: qty 5

## 2016-09-08 MED ORDER — SODIUM CHLORIDE 0.9 % IR SOLN
Status: DC | PRN
  Administered 2016-09-08: 1000 mL

## 2016-09-08 MED ORDER — ONDANSETRON HCL 4 MG/2ML IJ SOLN
INTRAMUSCULAR | Status: AC
  Filled 2016-09-08: qty 2

## 2016-09-08 MED ORDER — FENTANYL CITRATE (PF) 100 MCG/2ML IJ SOLN
INTRAMUSCULAR | Status: DC | PRN
  Administered 2016-09-08 (×4): 50 ug via INTRAVENOUS

## 2016-09-08 MED ORDER — EPHEDRINE 5 MG/ML INJ
INTRAVENOUS | Status: AC
  Filled 2016-09-08: qty 10

## 2016-09-08 MED ORDER — ACETAMINOPHEN 325 MG PO TABS: 650.0000 mg | ORAL_TABLET | ORAL | Status: DC | PRN

## 2016-09-08 MED ORDER — CEFAZOLIN SODIUM-DEXTROSE 2-4 GM/100ML-% IV SOLN
INTRAVENOUS | Status: AC
  Filled 2016-09-08: qty 100

## 2016-09-08 MED ORDER — ONDANSETRON HCL 4 MG PO TABS: 4.0000 mg | ORAL_TABLET | ORAL | 0 refills | Status: DC | PRN

## 2016-09-08 MED ORDER — LIDOCAINE 2% (20 MG/ML) 5 ML SYRINGE
INTRAMUSCULAR | Status: DC | PRN
  Administered 2016-09-08: 100 mg via INTRAVENOUS

## 2016-09-08 MED ORDER — SUGAMMADEX SODIUM 200 MG/2ML IV SOLN
INTRAVENOUS | Status: AC
  Filled 2016-09-08: qty 2

## 2016-09-08 MED ORDER — BUPIVACAINE HCL (PF) 0.5 % IJ SOLN
INTRAMUSCULAR | Status: AC
  Filled 2016-09-08: qty 30

## 2016-09-08 MED ORDER — BUPIVACAINE HCL (PF) 0.5 % IJ SOLN
INTRAMUSCULAR | Status: DC | PRN
  Administered 2016-09-08: 18 mL

## 2016-09-08 SURGICAL SUPPLY — 42 items
APL SKNCLS STERI-STRIP NONHPOA (GAUZE/BANDAGES/DRESSINGS) ×1
BENZOIN TINCTURE PRP APPL 2/3 (GAUZE/BANDAGES/DRESSINGS) ×2 IMPLANT
BINDER ABDOMINAL 12 ML 46-62 (SOFTGOODS) IMPLANT
BLADE HEX COATED 2.75 (ELECTRODE) ×2 IMPLANT
CHLORAPREP W/TINT 26ML (MISCELLANEOUS) ×2 IMPLANT
COVER SURGICAL LIGHT HANDLE (MISCELLANEOUS) ×2 IMPLANT
DRAIN CHANNEL 19F RND (DRAIN) IMPLANT
DRAPE INCISE IOBAN 66X45 STRL (DRAPES) IMPLANT
DRAPE LAPAROSCOPIC ABDOMINAL (DRAPES) ×2 IMPLANT
DRAPE UTILITY XL STRL (DRAPES) ×2 IMPLANT
DRSG TEGADERM 4X4.75 (GAUZE/BANDAGES/DRESSINGS) ×2 IMPLANT
ELECT REM PT RETURN 9FT ADLT (ELECTROSURGICAL) ×2
ELECTRODE REM PT RTRN 9FT ADLT (ELECTROSURGICAL) ×1 IMPLANT
EVACUATOR SILICONE 100CC (DRAIN) IMPLANT
GAUZE SPONGE 4X4 12PLY STRL (GAUZE/BANDAGES/DRESSINGS) ×2 IMPLANT
GLOVE BIOGEL PI IND STRL 7.5 (GLOVE) ×1 IMPLANT
GLOVE BIOGEL PI INDICATOR 7.5 (GLOVE) ×1
GLOVE ECLIPSE 8.0 STRL XLNG CF (GLOVE) ×2 IMPLANT
GLOVE INDICATOR 8.0 STRL GRN (GLOVE) ×2 IMPLANT
GLOVE SURG SS PI 7.5 STRL IVOR (GLOVE) ×2 IMPLANT
GOWN STRL REUS W/ TWL XL LVL3 (GOWN DISPOSABLE) ×1 IMPLANT
GOWN STRL REUS W/TWL XL LVL3 (GOWN DISPOSABLE) ×4 IMPLANT
KIT BASIN OR (CUSTOM PROCEDURE TRAY) ×2 IMPLANT
MESH HERNIA 3X6 (Mesh General) ×2 IMPLANT
NEEDLE HYPO 25X1 1.5 SAFETY (NEEDLE) ×2 IMPLANT
PACK GENERAL/GYN (CUSTOM PROCEDURE TRAY) ×2 IMPLANT
SEALER TISSUE X1 CVD JAW (INSTRUMENTS) IMPLANT
STAPLER VISISTAT 35W (STAPLE) ×2 IMPLANT
STRIP CLOSURE SKIN 1/2X4 (GAUZE/BANDAGES/DRESSINGS) ×2 IMPLANT
SUT ETHILON 3 0 PS 1 (SUTURE) IMPLANT
SUT MNCRL AB 3-0 PS2 18 (SUTURE) ×2 IMPLANT
SUT NOVA NAB DX-16 0-1 5-0 T12 (SUTURE) IMPLANT
SUT NOVA NAB GS-21 0 18 T12 DT (SUTURE) ×2 IMPLANT
SUT PROLENE 0 CT 2 (SUTURE) ×2 IMPLANT
SUT VIC AB 2-0 CT1 27 (SUTURE)
SUT VIC AB 2-0 CT1 TAPERPNT 27 (SUTURE) IMPLANT
SUT VIC AB 3-0 SH 27 (SUTURE) ×2
SUT VIC AB 3-0 SH 27X BRD (SUTURE) ×1 IMPLANT
SYR 20CC LL (SYRINGE) ×2 IMPLANT
TOWEL OR 17X26 10 PK STRL BLUE (TOWEL DISPOSABLE) ×2 IMPLANT
TOWEL OR NON WOVEN STRL DISP B (DISPOSABLE) ×2 IMPLANT
TRAY FOLEY W/METER SILVER 16FR (SET/KITS/TRAYS/PACK) IMPLANT

## 2016-09-08 NOTE — H&P (Signed)
Wesley [name]  DOB: [birth year] Married / Language: Cleophus Molt / Race: White Male   History of Present Illness  Patient words: new pt, unbilical hernia.  The patient is a 76 year old male.  Note:He was referred by Dr. Shelia Media for consultation regarding a intermittently symptomatic umbilical hernia. He is noticed this over the past year. Sometimes it causes him some discomfort. He goes to the [facility] and exercises every day. His exercises involve some resistance training as well as core exercises. No difficulty with urination or constipation. He is interested in discussing repair.  Other Problems  Arthritis Cancer Enlarged Prostate High blood pressure Sleep Apnea Umbilical Hernia   Past Surgical History  Cataract Surgery Bilateral. Colon Polyp Removal - Colonoscopy Shoulder Surgery Right.  Allergies (April Staton, CMA; 04/24/2016 2:15 PM) Demerol *ANALGESICS - OPIOID* Phenergan *ANTIHISTAMINES* Promethazine HCl *ANTIHISTAMINES*  Prior to Admission medications   Medication Sig Start Date End Date Taking? Authorizing Provider  aspirin 81 MG tablet Take 81 mg by mouth every other day. AT 2 AM EVERY OTHER DAY   Yes Historical Provider, MD  atorvastatin (LIPITOR) 80 MG tablet Take 1 tablet (80 mg total) by mouth daily. Patient taking differently: Take 80 mg by mouth daily. AT 2 AM DAILY 08/23/16  Yes Jerline Pain, MD  cetirizine (ZYRTEC) 10 MG tablet Take 10 mg by mouth daily. ALLERTEC  AT 2 AM DAILY   Yes Historical Provider, MD  Chromium Picolinate 800 MCG TABS Take 1 tablet by mouth daily.   Yes Historical Provider, MD  finasteride (PROSCAR) 5 MG tablet Take 5 mg by mouth every morning. AT 2 AM DAILY   Yes Historical Provider, MD  fluticasone (FLONASE) 50 MCG/ACT nasal spray Place 2 sprays into both nostrils daily as needed for allergies or rhinitis.   Yes Historical Provider, MD  ibuprofen (ADVIL,MOTRIN) 200 MG tablet Take 200 mg by mouth every 6 (six) hours  as needed.   Yes Historical Provider, MD  lisinopril (PRINIVIL,ZESTRIL) 10 MG tablet Take 10 mg by mouth every morning. AT 2 AM DAILY   Yes Historical Provider, MD  Magnesium 400 MG TABS Take 1 tablet by mouth daily as needed.   Yes Historical Provider, MD  Omega-3 Fatty Acids (FISH OIL) 1200 MG CAPS Take 1,200 mg by mouth daily. IN AFTERNOON AFTER LUNCH   Yes Historical Provider, MD  vitamin B-12 (CYANOCOBALAMIN) 1000 MCG tablet Take 1,000 mcg by mouth daily. AT 2 AM DAILY   Yes Historical Provider, MD    Social History Alcohol use Moderate alcohol use. Caffeine use Coffee. No drug use Tobacco use Never smoker.  Family History Cerebrovascular Accident Brother. Diabetes Mellitus Mother. Heart Disease Father. Heart disease in male family member before age 62 Melanoma Brother.   Physical Exam  The physical exam findings are as follows: Note:General: Overweight male in NAD. Pleasant and cooperative.  HEENT: [place]/AT, no external nasal or ear masses, mucous membranes are moist  EYES: no scleral icterus  CV: RRR, no murmur, no edema  CHEST: Breath sounds equal and clear. Respirations nonlabored.  ABDOMEN: Soft, nontender, nondistended, reducible umbilical bulge with 123456 cm palpable fascial defect.  GU: No bulges.  SKIN: No jaundice.  NEUROLOGIC: Alert and oriented, answers questions appropriately, normal gait and station.  PSYCHIATRIC: Normal mood, affect , and behavior.   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 He is interested in repair sometime later this year early next year.  Plan: Open umbilical  hernia repair with mesh. I asked him to call back when he would like to schedule the surgery. I have discussed the procedure, risks, and aftercare. Risks include but are not limited to bleeding, infection, wound healing problems, anesthesia, recurrence, injury to  intra-abdominal organs, cosmetic deformity. He seems to understand.  Jackolyn Confer, M.D.

## 2016-09-08 NOTE — Transfer of Care (Signed)
Immediate Anesthesia Transfer of Care Note  Patient: Wesley [name]  Procedure(s) Performed: Procedure(s): OPEN UMBILICAL HERNIA REPAIR WITH MESH (N/A) INSERTION OF MESH (N/A)  Patient Location: PACU  Anesthesia Type:General  Level of Consciousness:  sedated, patient cooperative and responds to stimulation  Airway & Oxygen Therapy:Patient Spontanous Breathing and Patient connected to face mask oxgen  Post-op Assessment:  Report given to PACU RN and Post -op Vital signs reviewed and stable  Post vital signs:  Reviewed and stable  Last Vitals:  Vitals:   09/08/16 0822  BP: 134/80  Pulse: 87  Resp: 18  Temp: [id number] C    Complications: No apparent anesthesia complications

## 2016-09-08 NOTE — Interval H&P Note (Signed)
History and Physical Interval Note:  09/08/2016 9:48 AM  [name]  has presented today for surgery, with the diagnosis of UMBILICAL HERNIA  The various methods of treatment have been discussed with the patient and family. After consideration of risks, benefits and other options for treatment, the patient has consented to  Procedure(s): OPEN UMBILICAL HERNIA REPAIR WITH MESH (N/A) INSERTION OF MESH (N/A) as a surgical intervention .  The patient's history has been reviewed, patient examined, no change in status, stable for surgery.  I have reviewed the patient's chart and labs.  Questions were answered to the patient's satisfaction.     Darienne Belleau Lenna Sciara

## 2016-09-08 NOTE — Anesthesia Preprocedure Evaluation (Addendum)
Anesthesia Evaluation  Patient identified by MRN, date of birth, ID band Patient awake    Reviewed: Allergy & Precautions, NPO status , Patient's Chart, lab work & pertinent test results  History of Anesthesia Complications Negative for: history of anesthetic complications  Airway Mallampati: II  TM Distance: <3 FB Neck ROM: Full    Dental  (+) Teeth Intact, Dental Advisory Given   Pulmonary    breath sounds clear to auscultation       Cardiovascular hypertension, + CAD   Rhythm:Regular Rate:Normal  EKG and Stress test results are noted. stable   Neuro/Psych    GI/Hepatic GERD  ,  Endo/Other  negative endocrine ROS  Renal/GU negative Renal ROS     Musculoskeletal   Abdominal (+) + obese,   Peds  Hematology negative hematology ROS (+)   Anesthesia Other Findings   Reproductive/Obstetrics                            Anesthesia Physical Anesthesia Plan  ASA: III  Anesthesia Plan: General   Post-op Pain Management:    Induction: Intravenous  Airway Management Planned: Oral ETT  Additional Equipment:   Intra-op Plan:   Post-operative Plan: Extubation in OR  Informed Consent: I have reviewed the patients History and Physical, chart, labs and discussed the procedure including the risks, benefits and alternatives for the proposed anesthesia with the patient or authorized representative who has indicated his/her understanding and acceptance.   Dental advisory given  Plan Discussed with:   Anesthesia Plan Comments:         Anesthesia Quick Evaluation

## 2016-09-08 NOTE — Anesthesia Postprocedure Evaluation (Addendum)
Anesthesia Post Note  Patient: YAZAN BETHANCOURT  Procedure(s) Performed: Procedure(s) (LRB): OPEN UMBILICAL HERNIA REPAIR WITH MESH (N/A) INSERTION OF MESH (N/A)  Patient location during evaluation: PACU Anesthesia Type: General Level of consciousness: awake and sedated Pain management: pain level controlled Vital Signs Assessment: post-procedure vital signs reviewed and stable Respiratory status: spontaneous breathing, nonlabored ventilation, respiratory function stable and patient connected to nasal cannula oxygen Cardiovascular status: blood pressure returned to baseline and stable Postop Assessment: no signs of nausea or vomiting Anesthetic complications: no       Last Vitals:  Vitals:   09/08/16 1258 09/08/16 1415  BP: (!) 140/97 138/88  Pulse: 84 88  Resp: 18 18  Temp: 36.4 C 36.6 C    Last Pain:  Vitals:   09/08/16 1415  TempSrc: Oral  PainSc: 1                  Alter Moss,JAMES TERRILL

## 2016-09-08 NOTE — Op Note (Signed)
OPERATIVE NOTE- UMBILICAL HERNIA REPAIR  Preoperative diagnosis: Umbilical hernia  Postoperative diagnosis: Same  Procedure: Umbilical hernia repair with mesh.  Surgeon: Jackolyn Confer M.D.  Anesthesia: General plus Marcaine local  EBL < 150 ml.   Indication: This is a 76 year old active male with a intermittently symptomatic umbilical hernia.  He now presents for repair.  Technique: He was seen in the holding room and then brought to the operating room, placed supine on the operating table, and a general anesthetic was given.  The hair in the periumbilical area was clipped as was necessary.  The periumbilical area was sterilely prepped and draped. A timeout was performed.  Marcaine solution was infiltrated superficially and deep in the periumbilical area. A subumbilical transverse incision was made through the skin and subcutaneous tissue until the fascia was identified. The subcutaneous tissue was mobilized free from the fascia inferiorly and laterally. The umbilical stalk was mobilized and dissected free from the fascia exposing the underlying hernia defect. The hernia sac and tissue within it were reduced back into the abdominal cavity.  The subcutaneous tissue was freed from the fascia for 3-4 cm around the primary defect. The primary defect was then closed with interrupted #0 Novofil sutures. The sutures were left long.  A piece of polypropylene mesh was brought into the field. The primary repair sutures were threaded up through the mesh and tied down anchoring the mesh directly over the primary repair. The periphery of the mesh was then anchored to the fascia with a running 0 Prolene suture to allow for 3-4 cm of mesh overlap. The excess mesh was trimmed and removed.  Hemostasis was adequate at this time. The umbilicus was reimplanted onto the mesh and fascia with 3-0 Vicryl suture. The subcutaneous tissue was closed over the mesh with a running 3-0 Vicryl suture. The skin was closed  with a 4-0 Monocryl subcuticular stitch. Steri-Strips and sterile dressings were applied.  He tolerated the procedure well without any apparent complications and was taken to the recovery room in satisfactory condition.

## 2016-09-08 NOTE — Anesthesia Procedure Notes (Signed)
Procedure Name: Intubation Date/Time: 09/08/2016 10:49 AM Performed by: Maxwell Caul Pre-anesthesia Checklist: Patient identified, Emergency Drugs available, Suction available and Patient being monitored Patient Re-evaluated:Patient Re-evaluated prior to inductionOxygen Delivery Method: Circle system utilized Preoxygenation: Pre-oxygenation with 100% oxygen Intubation Type: IV induction Ventilation: Mask ventilation without difficulty Laryngoscope Size: Mac and 4 Grade View: Grade I Tube type: Oral Tube size: 7.5 mm Number of attempts: 1 Airway Equipment and Method: Stylet Placement Confirmation: ETT inserted through vocal cords under direct vision,  positive ETCO2 and breath sounds checked- equal and bilateral Secured at: 21 cm Tube secured with: Tape Dental Injury: Teeth and Oropharynx as per pre-operative assessment

## 2016-09-08 NOTE — Discharge Instructions (Addendum)
[facility] _______[facility]   UMBILICAL HERNIA REPAIR: POST OP INSTRUCTIONS  Always review your discharge instruction sheet given to you by the facility where your surgery was performed. IF YOU HAVE DISABILITY OR FAMILY LEAVE FORMS, YOU MUST BRING THEM TO THE OFFICE FOR PROCESSING.   DO NOT GIVE THEM TO YOUR DOCTOR.  1. A  prescription for pain medication may be given to you upon discharge.  Take your pain medication as prescribed, if needed.  If narcotic pain medicine is not needed, then you may take acetaminophen (Tylenol) or ibuprofen (Advil) as needed. 2. Take your usually prescribed medications unless otherwise directed. 3. If you need a refill on your pain medication, please contact your pharmacy.  They will contact our office to request authorization. Prescriptions will not be filled after 5 pm or on week-ends. 4. You should follow a light diet the first 24 hours after arrival home, such as soup and crackers, etc.  Be sure to include lots of fluids daily.  Resume your normal diet the day after surgery. 5. Most patients will experience some swelling and bruising around the umbilicus (belly button).  Ice packs and reclining will help.  Swelling and bruising can take many days to resolve.  6. It is common to experience some constipation if taking pain medication after surgery.  Increasing fluid intake and taking a stool softener (such as Colace) will usually help or prevent this problem from occurring.  A mild laxative (Milk of Magnesia or Miralax) should be taken according to package directions if there are no bowel movements after 48 hours. 7. Unless discharge instructions indicate otherwise, you may remove your bandages 72 hours after surgery, and you may shower at that time.  You may have steri-strips (small skin tapes) in place directly over the incision.  These strips should be left on the skin.  If your surgeon used skin glue on the incision, you may shower in 24 hours.  The glue  will flake off over the next 2-3 weeks.  Any sutures or staples will be removed at the office during your follow-up visit. 8. ACTIVITIES:  You may resume regular (light) daily activities beginning the next day--such as daily self-care, walking, climbing stairs--gradually increasing activities as tolerated.  You may have sexual intercourse when it is comfortable.  Refrain from any heavy lifting or straining-nothing over 10 pounds for 6 weeks.  a. You may drive when you are no longer taking prescription pain medication, you can comfortably wear a seatbelt, and you can safely maneuver your car and apply brakes. b. RETURN TO WORK:  Desk work/Light work in 1-2 weeks, full duty in 6 weeks._________________________________________________________ 9. You should see your doctor in the office for a follow-up appointment approximately 2-3 weeks after your surgery.  Make sure that you call for this appointment within a day or two after you arrive home to insure a convenient appointment time. 10. OTHER INSTRUCTIONS:  _Try not to lie completely flat.__Restart Aspirin 09/11/16._______________________________________________________________________________________________________________________________________________________________________________________  WHEN TO CALL YOUR DOCTOR: 1. Fever over 101.0 2. Inability to urinate 3. Nausea and/or vomiting 4. Extreme swelling or bruising 5. Continued bleeding from incision. 6. Increased pain, redness, or drainage from the incision  The clinic staff is available to answer your questions during regular business hours.  Please dont hesitate to call and ask to speak to one of the nurses for clinical concerns.  If you have a medical emergency, go to the nearest emergency room or call 911.  A surgeon from [employer]  [facility] is always on call at the hospital   [address], [place], [place], [place]  [address] ?  [address], [place], [place]   [address] [phone] ? [phone] ? FAX [phone] Web site: [email or web address]

## 2016-09-18 ENCOUNTER — Other Ambulatory Visit: Payer: Self-pay | Admitting: Cardiology

## 2016-10-02 DIAGNOSIS — H524 Presbyopia: Secondary | ICD-10-CM | POA: Diagnosis not present

## 2016-10-02 DIAGNOSIS — H26491 Other secondary cataract, right eye: Secondary | ICD-10-CM | POA: Diagnosis not present

## 2016-10-02 DIAGNOSIS — H40053 Ocular hypertension, bilateral: Secondary | ICD-10-CM | POA: Diagnosis not present

## 2016-10-02 DIAGNOSIS — H35371 Puckering of macula, right eye: Secondary | ICD-10-CM | POA: Diagnosis not present

## 2016-11-23 DIAGNOSIS — H26491 Other secondary cataract, right eye: Secondary | ICD-10-CM | POA: Diagnosis not present

## 2017-01-12 NOTE — Addendum Note (Signed)
Addendum  created 01/12/17 1004 by Rica Koyanagi, MD   Sign clinical note

## 2017-03-07 ENCOUNTER — Other Ambulatory Visit: Payer: Self-pay | Admitting: Cardiology

## 2017-03-08 ENCOUNTER — Other Ambulatory Visit: Payer: Self-pay | Admitting: Cardiology

## 2017-03-08 MED ORDER — ATORVASTATIN CALCIUM 80 MG PO TABS: 80.0000 mg | ORAL_TABLET | Freq: Every day | ORAL | 0 refills | Status: DC

## 2017-03-13 DIAGNOSIS — D692 Other nonthrombocytopenic purpura: Secondary | ICD-10-CM | POA: Diagnosis not present

## 2017-03-13 DIAGNOSIS — L570 Actinic keratosis: Secondary | ICD-10-CM | POA: Diagnosis not present

## 2017-03-13 DIAGNOSIS — D1801 Hemangioma of skin and subcutaneous tissue: Secondary | ICD-10-CM | POA: Diagnosis not present

## 2017-03-13 DIAGNOSIS — Z85828 Personal history of other malignant neoplasm of skin: Secondary | ICD-10-CM | POA: Diagnosis not present

## 2017-03-13 DIAGNOSIS — L814 Other melanin hyperpigmentation: Secondary | ICD-10-CM | POA: Diagnosis not present

## 2017-03-13 DIAGNOSIS — L821 Other seborrheic keratosis: Secondary | ICD-10-CM | POA: Diagnosis not present

## 2017-05-10 ENCOUNTER — Other Ambulatory Visit: Payer: Self-pay | Admitting: Cardiology

## 2017-05-19 ENCOUNTER — Other Ambulatory Visit: Payer: Self-pay | Admitting: Cardiology

## 2017-05-24 DIAGNOSIS — Z85828 Personal history of other malignant neoplasm of skin: Secondary | ICD-10-CM | POA: Diagnosis not present

## 2017-05-24 DIAGNOSIS — L821 Other seborrheic keratosis: Secondary | ICD-10-CM | POA: Diagnosis not present

## 2017-05-30 ENCOUNTER — Encounter: Payer: Self-pay | Admitting: Cardiology

## 2017-06-13 ENCOUNTER — Ambulatory Visit: Payer: Medicare Other | Admitting: Cardiology

## 2017-06-26 ENCOUNTER — Ambulatory Visit (INDEPENDENT_AMBULATORY_CARE_PROVIDER_SITE_OTHER): Payer: Medicare Other | Admitting: Cardiology

## 2017-06-26 ENCOUNTER — Encounter: Payer: Self-pay | Admitting: Cardiology

## 2017-06-26 VITALS — BP 142/86 | HR 86 | Ht 68.0 in | Wt 192.5 lb

## 2017-06-26 DIAGNOSIS — I2510 Atherosclerotic heart disease of native coronary artery without angina pectoris: Secondary | ICD-10-CM

## 2017-06-26 MED ORDER — ATORVASTATIN CALCIUM 80 MG PO TABS: 80.0000 mg | ORAL_TABLET | Freq: Every day | ORAL | 3 refills | Status: DC

## 2017-06-26 NOTE — Progress Notes (Signed)
06/26/2017 Wesley [name]   [birth year]  [id number]  Primary Physician Deland Pretty, MD Primary Cardiologist: Dr. Marlou Porch   Reason for Visit/CC: CAD  HPI:  Wesley [name] is a 76 y.o. male who is being seen today for routine f/u. He was first referred to see Dr. Marlou Porch 03/2016 by his PCP, Dr. Shelia Media, given abnormal chest CT showing coronary artery calcifications and abnormal EKG that showed small new Q waves in V1 and V2.  In review of office note from 02/24/16 from Dr. Shelia Media, an echocardiogram in 2009 was negative, cardiac catheterization was negative in 2008, echocardiogram in 2015 showed mild LVH with EF of 60-65% and CT scans in 2015 showed coronary artery calcification but no aneurysm.  Additional risk factors>>Father died at age 46 of CAD. Nonsmoker. Also h/o DLD.   When patient was seen by Dr. Marlou Porch last year, he denied anginal symptoms. However given his risk factors, chest CT findings and abnormal EKG, Dr. Marlou Porch ordered for him to undergo noninvasive ischemic testing by nuclear perfusion study to rule out coronary ischemia. Stress test was performed 04/11/16 and showed no ischemia and no evidence of previous MI. EF was estimated at 68%. This was considered a low risk study. Continued medical therapy for reduction of disease progression was recommended. He has been treated with high dose statin therapy, Lipitor 80 mg. He is also on lisinopril for BP.   He presents back to clinic today for f/u. He reports he has done well since his last OV. No new medical problems. No hospitalizations, ED visits nor surgeries. He continues to deny any ischemic symptoms. No exertional CP or dyspnea. No other cardiac symptoms. He reports full compliance with Lipitor. Dr. Shelia Media follows his lipid profile. His BP is well controlled today.    Current Meds  Medication Sig  . atorvastatin (LIPITOR) 80 MG tablet TAKE 1 TABLET EVERY DAY (PLEASE KEEP UPCOMING APPOINTMENT FOR FURTHER REFILLS)  . cetirizine  (ZYRTEC) 10 MG tablet Take 10 mg by mouth daily. ALLERTEC  AT 2 AM DAILY  . Chromium Picolinate 800 MCG TABS Take 1 tablet by mouth daily.  . finasteride (PROSCAR) 5 MG tablet Take 5 mg by mouth every morning. AT 2 AM DAILY  . fluticasone (FLONASE) 50 MCG/ACT nasal spray Place 2 sprays into both nostrils daily as needed for allergies or rhinitis.  Marland Kitchen ibuprofen (ADVIL,MOTRIN) 200 MG tablet Take 200 mg by mouth every 6 (six) hours as needed.  Marland Kitchen lisinopril (PRINIVIL,ZESTRIL) 10 MG tablet Take 10 mg by mouth every morning. AT 2 AM DAILY  . Magnesium 400 MG TABS Take 1 tablet by mouth daily as needed.  . Omega-3 Fatty Acids (FISH OIL) 1200 MG CAPS Take 1,200 mg by mouth daily. IN AFTERNOON AFTER LUNCH  . ondansetron (ZOFRAN) 4 MG tablet Take 1 tablet (4 mg total) by mouth every 4 (four) hours as needed for nausea or vomiting.  . vitamin B-12 (CYANOCOBALAMIN) 1000 MCG tablet Take 1,000 mcg by mouth daily. AT 2 AM DAILY   Allergies  Allergen Reactions  . Demerol Other (See Comments)    Causes dizziness  . Promethazine Hcl Other (See Comments)    Dizziness    Past Medical History:  Diagnosis Date  . 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 Chronic allergic rhinitis   . Coronary atherosclerosis of native coronary artery   . GERD (gastroesophageal reflux disease)   . High blood pressure   . Hypercholesterolemia   .  Macrocytosis without anemia   . OSA on CPAP   . 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 Sleep apnea   . Umbilical hernia without obstruction or gangrene    Family History  Problem Relation Age of Onset  . Diabetes Mother   . CAD Father   . Melanoma Brother 64  . Healthy Daughter   . Healthy Son    Past Surgical History:  Procedure Laterality Date  . CARDIAC CATHETERIZATION  2000   Cath to rule out cardiac problems RT HTN. PT denies significant findings  . CARDIAC CATHETERIZATION  2008  . EYE SURGERY     BILATERAL CATARACT  SURGERY WITH LENS IMPLANTS  . right rotator cuff    . ROTATOR CUFF REPAIR    . TONSILLECTOMY     Social History   Socioeconomic History  . Marital status: Married    Spouse name: Not on file  . Number of children: Not on file  . Years of education: Not on file  . Highest education level: Not on file  Social Needs  . Financial resource strain: Not on file  . Food insecurity - worry: Not on file  . Food insecurity - inability: Not on file  . Transportation needs - medical: Not on file  . Transportation needs - non-medical: Not on file  Occupational History  . Not on file  Tobacco Use  . Smoking status: Never Smoker  . Smokeless tobacco: Never Used  Substance and Sexual Activity  . Alcohol use: Yes    Alcohol/week: 7.0 oz    Types: 14 drink(s) per week    Comment: or more  . Drug use: No  . Sexual activity: Not on file  Other Topics Concern  . Not on file  Social History Narrative  . Not on file     Review of Systems: General: negative for chills, fever, night sweats or weight changes.  Cardiovascular: negative for chest pain, dyspnea on exertion, edema, orthopnea, palpitations, paroxysmal nocturnal dyspnea or shortness of breath Dermatological: negative for rash Respiratory: negative for cough or wheezing Urologic: negative for hematuria Abdominal: negative for nausea, vomiting, diarrhea, bright red blood per rectum, melena, or hematemesis Neurologic: negative for visual changes, syncope, or dizziness All other systems reviewed and are otherwise negative except as noted above.   Physical Exam:  Blood pressure (!) 142/86, pulse 86, height 5\' 8"  (1.727 m), weight 192 lb 8 oz (87.3 kg), SpO2 94 %.  General appearance: alert, cooperative and no distress Neck: no carotid bruit and no JVD Lungs: clear to auscultation bilaterally Heart: regular rate and rhythm, S1, S2 normal, no murmur, click, rub or gallop Extremities: extremities normal, atraumatic, no cyanosis or  edema Pulses: 2+ and symmetric Skin: Skin color, texture, turgor normal. No rashes or lesions Neurologic: Grossly normal  EKG NSR incomplete RBBB -- personally reviewed   ASSESSMENT AND PLAN:   1. CAD: Coronary artery calcifications noted on previous chest CT, however NST in 2017 showed no evidence of coronary ischemia nor prior infarction. EF normal. He has no cardiac symptoms. Physical exam is unremarkable. EKG uncharged from previous. Continue management of risk factors for risk reduction. Continue high dose statin therapy w/ Lipitor and control of BP with use of lisinopril. We also discussed daily low dose ASA as a preventive measure as well, however he uses NSAIDs regularly for arthritis pain and has avoided daily ASA, per recs from PCP . Pt instructed to notify us if he develops any symptoms  concerning for ischemia, including exertional CP or dyspnea. Dr. Shelia Media will continue to follow his lipid profile. He has his yearly f/u with him next month.    Follow-Up in 6-12 months with Dr. Ezequiel Essex Ladoris Gene, [facility] 06/26/2017 8:40 AM

## 2017-06-26 NOTE — Patient Instructions (Addendum)
Medication Instructions:   Your physician recommends that you continue on your current medications as directed. Please refer to the Current Medication list given to you today.    If you need a refill on your cardiac medications before your next appointment, please call your pharmacy.  Labwork: NONE ORDERED  TODAY    Testing/Procedures:  NONE ORDERED  TODAY    Follow-Up:  Your physician wants you to follow-up in:  IN 6 MONTHS WITH DR Marlou Porch.Marland KitchenYou will receive a reminder letter in the mail two months in advance. If you don't receive a letter, please call our office to schedule the follow-up appointment.      Any Other Special Instructions Will Be Listed Below (If Applicable).

## 2017-07-27 DIAGNOSIS — Z23 Encounter for immunization: Secondary | ICD-10-CM | POA: Diagnosis not present

## 2017-09-13 DIAGNOSIS — L821 Other seborrheic keratosis: Secondary | ICD-10-CM | POA: Diagnosis not present

## 2017-09-13 DIAGNOSIS — L570 Actinic keratosis: Secondary | ICD-10-CM | POA: Diagnosis not present

## 2017-09-13 DIAGNOSIS — Z85828 Personal history of other malignant neoplasm of skin: Secondary | ICD-10-CM | POA: Diagnosis not present

## 2017-09-13 DIAGNOSIS — L812 Freckles: Secondary | ICD-10-CM | POA: Diagnosis not present

## 2017-09-14 DIAGNOSIS — E559 Vitamin D deficiency, unspecified: Secondary | ICD-10-CM | POA: Diagnosis not present

## 2017-09-14 DIAGNOSIS — Z125 Encounter for screening for malignant neoplasm of prostate: Secondary | ICD-10-CM | POA: Diagnosis not present

## 2017-09-14 DIAGNOSIS — E7800 Pure hypercholesterolemia, unspecified: Secondary | ICD-10-CM | POA: Diagnosis not present

## 2017-09-18 DIAGNOSIS — N401 Enlarged prostate with lower urinary tract symptoms: Secondary | ICD-10-CM | POA: Diagnosis not present

## 2017-09-18 DIAGNOSIS — J309 Allergic rhinitis, unspecified: Secondary | ICD-10-CM | POA: Diagnosis not present

## 2017-09-18 DIAGNOSIS — R7303 Prediabetes: Secondary | ICD-10-CM | POA: Diagnosis not present

## 2017-09-18 DIAGNOSIS — E7800 Pure hypercholesterolemia, unspecified: Secondary | ICD-10-CM | POA: Diagnosis not present

## 2017-09-18 DIAGNOSIS — R9431 Abnormal electrocardiogram [ECG] [EKG]: Secondary | ICD-10-CM | POA: Diagnosis not present

## 2017-09-18 DIAGNOSIS — K219 Gastro-esophageal reflux disease without esophagitis: Secondary | ICD-10-CM | POA: Diagnosis not present

## 2017-09-18 DIAGNOSIS — I10 Essential (primary) hypertension: Secondary | ICD-10-CM | POA: Diagnosis not present

## 2017-09-18 DIAGNOSIS — Z6830 Body mass index (BMI) 30.0-30.9, adult: Secondary | ICD-10-CM | POA: Diagnosis not present

## 2017-09-18 DIAGNOSIS — E559 Vitamin D deficiency, unspecified: Secondary | ICD-10-CM | POA: Diagnosis not present

## 2017-09-18 DIAGNOSIS — I2510 Atherosclerotic heart disease of native coronary artery without angina pectoris: Secondary | ICD-10-CM | POA: Diagnosis not present

## 2017-09-18 DIAGNOSIS — G4733 Obstructive sleep apnea (adult) (pediatric): Secondary | ICD-10-CM | POA: Diagnosis not present

## 2017-09-19 DIAGNOSIS — R972 Elevated prostate specific antigen [PSA]: Secondary | ICD-10-CM | POA: Diagnosis not present

## 2017-09-19 DIAGNOSIS — N403 Nodular prostate with lower urinary tract symptoms: Secondary | ICD-10-CM | POA: Diagnosis not present

## 2017-09-19 DIAGNOSIS — R351 Nocturia: Secondary | ICD-10-CM | POA: Diagnosis not present

## 2017-09-21 ENCOUNTER — Encounter: Payer: Self-pay | Admitting: Family Medicine

## 2017-09-21 ENCOUNTER — Ambulatory Visit (INDEPENDENT_AMBULATORY_CARE_PROVIDER_SITE_OTHER): Payer: Medicare Other | Admitting: Family Medicine

## 2017-09-21 ENCOUNTER — Ambulatory Visit
Admission: RE | Admit: 2017-09-21 | Discharge: 2017-09-21 | Disposition: A | Discharge status: Home / Self Care | Payer: Medicare Other | Source: Ambulatory Visit | Attending: Family Medicine | Admitting: Family Medicine

## 2017-09-21 VITALS — BP 118/80 | Ht 69.0 in | Wt 190.0 lb

## 2017-09-21 DIAGNOSIS — G8929 Other chronic pain: Secondary | ICD-10-CM

## 2017-09-21 DIAGNOSIS — M25511 Pain in right shoulder: Secondary | ICD-10-CM

## 2017-09-21 DIAGNOSIS — M75101 Unspecified rotator cuff tear or rupture of right shoulder, not specified as traumatic: Secondary | ICD-10-CM | POA: Diagnosis not present

## 2017-09-21 DIAGNOSIS — M75100 Unspecified rotator cuff tear or rupture of unspecified shoulder, not specified as traumatic: Secondary | ICD-10-CM | POA: Insufficient documentation

## 2017-09-21 DIAGNOSIS — M19011 Primary osteoarthritis, right shoulder: Secondary | ICD-10-CM | POA: Diagnosis not present

## 2017-09-21 DIAGNOSIS — M19012 Primary osteoarthritis, left shoulder: Secondary | ICD-10-CM

## 2017-09-21 IMAGING — CR DG SHOULDER 2+V*R*
4 series · 4 of 4 positions shown · non-contrast
Comparison: None.

CLINICAL DATA: Generalized right shoulder pain for the past year.
Rotator cuff surgery 20 years ago. No known injury.

EXAM:
RIGHT SHOULDER - 2+ VIEW

[w shoulder ap internal righ * (1 of 2)]
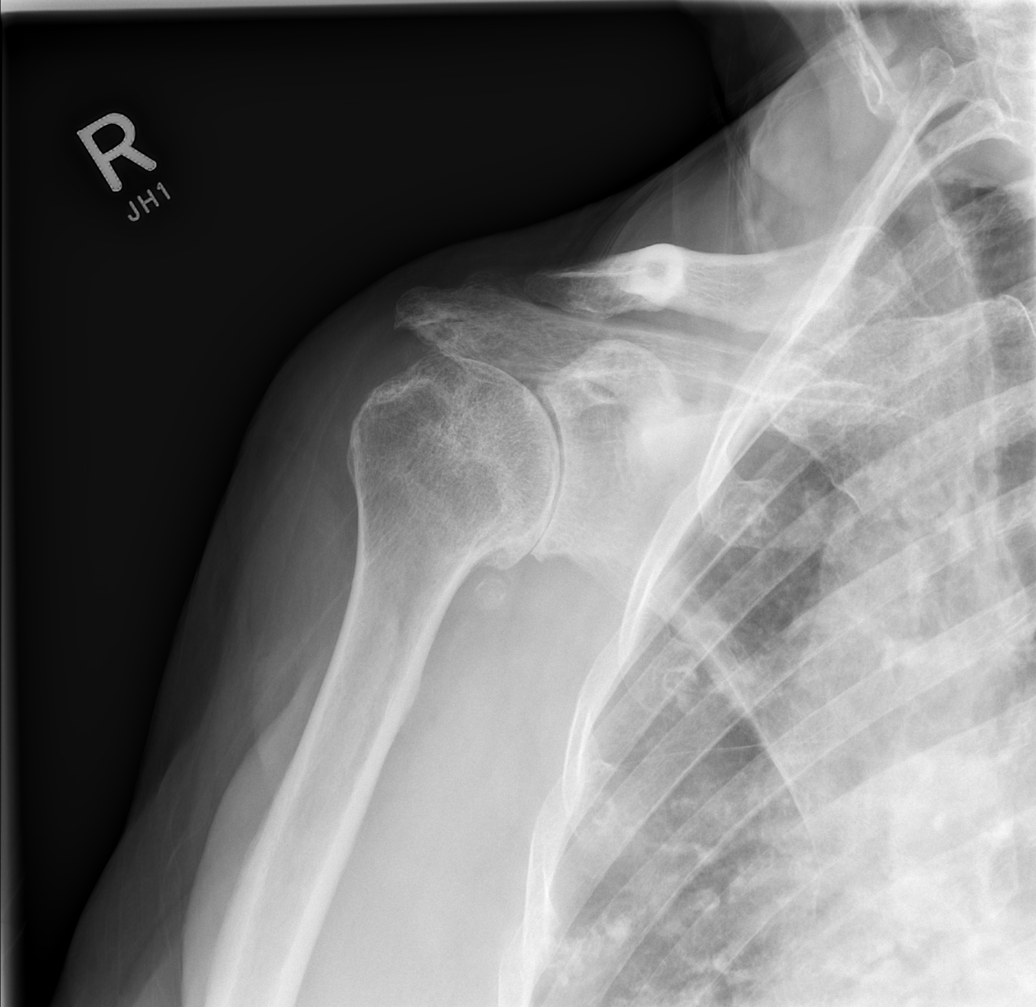

[w shoulder ap internal righ * (2 of 2)]
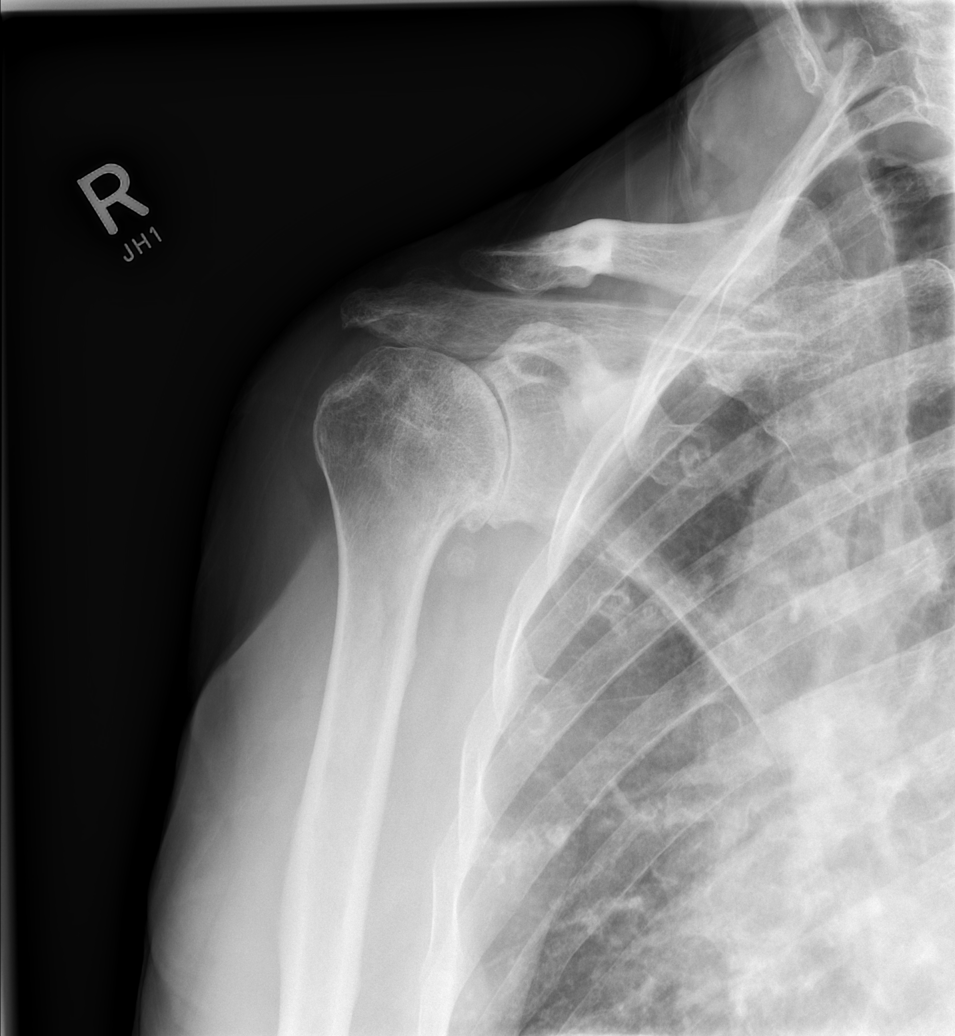

[w shoulder y view right *]
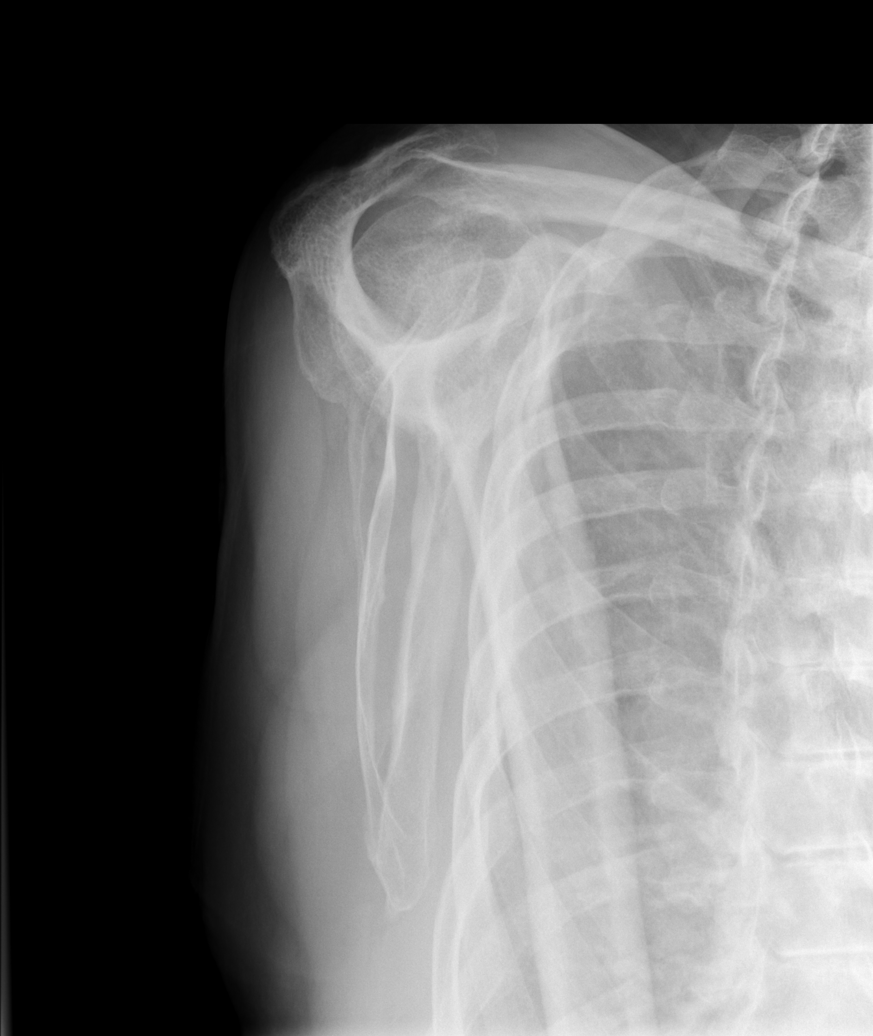

[x shoulder axillary right *]
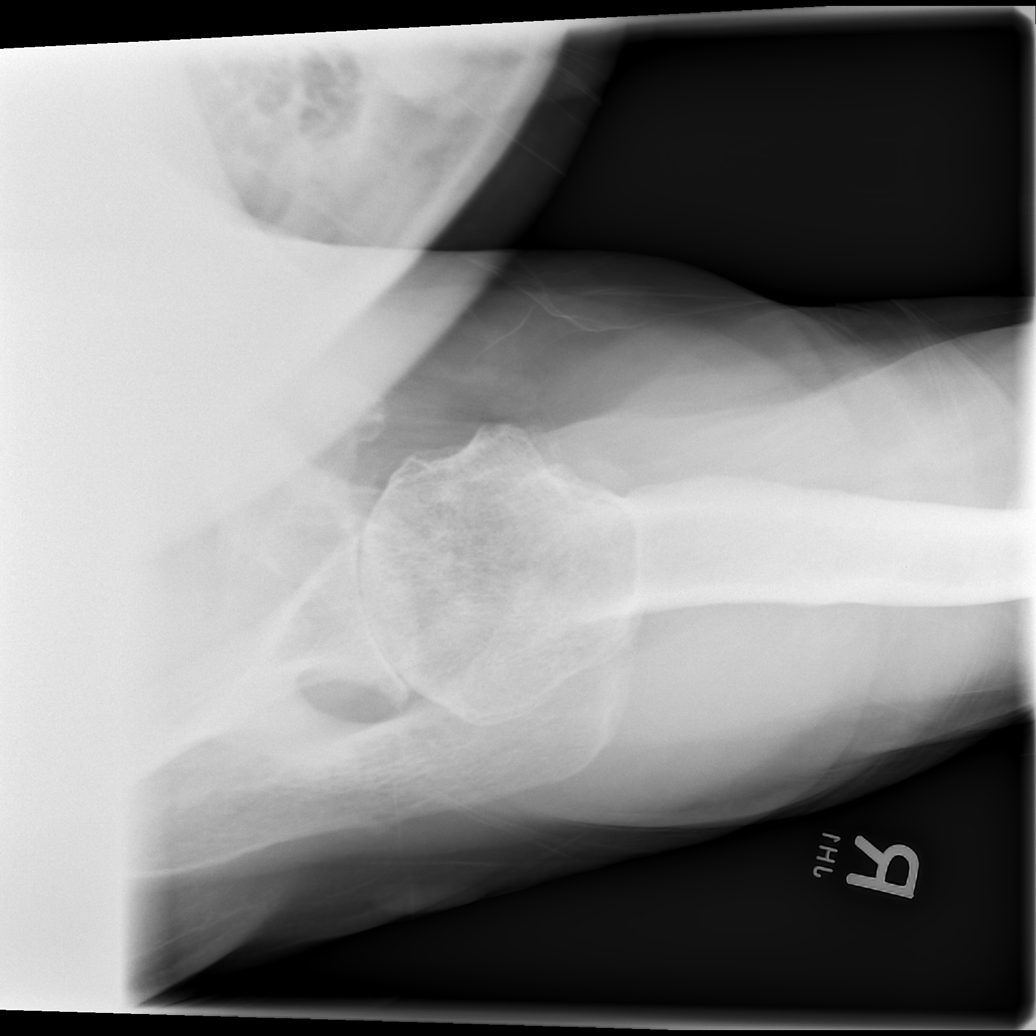

[4 of 4 positions shown; findings below may reference images not displayed]

FINDINGS: Marked glenohumeral joint space narrowing with associated spur
formation. Inferior calcified loose body.
IMPRESSION: Moderate to marked right glenohumeral joint degenerative changes
with a calcified loose body inferiorly.

## 2017-09-21 NOTE — Assessment & Plan Note (Signed)
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  He was originally sent here for concerns with frozen shoulder, however after examination it appears as though patient likely has reduced range of motion secondary to osteoarthritic changes.  Ultrasound yielded glenohumeral joint effusion and rotator cuff tearing.  These are likely related to his osteoarthritis and age-related changes. -Patient denied any significant pain at this time so he did not offer any injection or additional medications today. -Right shoulder x-ray ordered >> will contact patient with x-ray results. -Provided patient with range of motion and rotator cuff strengthening exercises. -Follow-up as needed

## 2017-09-21 NOTE — Progress Notes (Signed)
Subjective:    Patient ID: Wesley [name], male    DOB: [birth year], 77 y.o.   MRN: [id number]  HPI Mr. Beach is a 77 year old male who was referred here by his PCP for decreased ROM of right shoulder. Concern was for frozen shoulder. The patient is an Art gallery manager, and states that he first noticed difficulty with overhead activity such as changing light bulbs a year ago. Since then, he has noticed slightly worsening ROM of the shoulder. He denies pain with ROM, and has some mild pain if he sleeps on the shoulder for too long. He also has significant popping of his shoulder with ROM. He denies any recent trauma or injury to the shoulder. He is not taking any medications for this, and has started doing ROM exercises in the shower with some improvement.   Of note, he had a partial rotator cuff tear in the right shoulder 20 years ago which he had surgery for. He states that he went to rehab appropriately and regained full strength and ROM after that surgery.   Review of Systems Negative except as noted above    Objective:   Physical Exam General: Well appearing, no acute distress  Right shoulder: No erythema or obvious atrophy. No tenderness to palpation of bicep tendon or rotator cuff. Active abduction limited to 90 degrees, active flexion limited to 100 degrees. Active internal and external rotation is preserved. Passive abduction to 160 degrees with significant crepitus noted. Passive flexion to 135 degrees. Mild strength deficit of rotator cuff noted. Negative hawkins, negative Obrians. Negative speed test, negative Yergasons.  Left shoulder: No erythema or obvious atrophy. No tenderness to palpation. Active ROM intact in flexion, abduction, internal and external rotation. No crepitus noted with ROM.   Bedside US performed, limited images of right shoulder obtained: positive target sign around bicep tendon, significant effusion presents. Atrophy of supraspinatus and infraspinatus  noted. Cortical irregularity of supraspinatus insertion on humeral head.     Assessment & Plan:  77 year old male with 1 year of decreased ROM of right shoulder. 20 years status post rotator cuff repair. No acute injury or recent surgery. Physical exam demonstrates decreased active ROM but fairly good passive ROM with significant crepitus. US showed effusion of the joint. Suspect symptoms are more likely caused by OA vs frozen shoulder. Patient will be sent for shoulder XR. He was also give home ROM exercises to continue. We will call with his XR results, and he may return to discuss further management options. Given his lack of pain, he is not likely a surgical candidate at this time.   Attending/Fellow Addendum:  I have seen and discussed this patient's case at length along side this medical student. I have reviewed the following note. I agree with the medical student's documentation and have included my own below.   Objective: BP 118/80   Ht 5\' 9"  (1.753 m)   Wt 190 lb (86.2 kg)   BMI 28.06 kg/m  Gen: NAD, alert, cooperative. CV: Well-perfused. Resp: Non-labored. Neuro: Sensation intact throughout. Shoulder, Right: No specific areas of TTP noted. No evidence of bony deformity, asymmetry, or muscle atrophy; No tenderness over long head of biceps (bicipital groove). No TTP at [facility] joint.  Active range of motion reduced and forward flexion, abduction, external rotation, and internal rotation.  Passive range of motion reduced as well however to a significantly lesser extent.  +2 glenohumeral crepitus present with passive range of motion. Strength diminished in abduction and  internal rotation. No abnormal scapular function observed. Sensation intact. Peripheral pulses intact.  Special Tests:   - Crossarm test: NEG   - Empty can: Postive   - Hawkins: NEG   - Obrien's test: Positive   - Speeds test: NEG  ULTRASOUND: Shoulder, Right  Diagnostic complete ultrasound imaging obtained of  patient's right shoulder.  - Moderate level of bony deformity or osteophyte development appreciated throughout the glenohumeral joint. - Long head of the biceps tendon: Moderate to severe proximal calcific tendinosis of the biceps long head seen in both short and long axis views.  Severe edema with positive bullseye sign.  - Subscapularis tendon: complete visualization across the width of the insertion point yielded evidence of tendonous disruption and partial tearing with calcific changes.   - Supraspinatus tendon: complete visualization across the width of the insertion point yielded evidence of severe tendonous disruption with likely high-grade tearing in the long axis view. Evidence of glenohumeral joint effusion extension into this region noted.  - Infraspinatus and teres minor tendons: visualization across the width of the insertion points yielded no evidence of tendon thickening, but some calcific changes present in the long axis view.  [facility] Joint: No evidence of joint separation, collapse, or osteophyte development appreciated.  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 -Mild to moderate subscapularis partial tendon tear rupture.   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  He was originally sent here for concerns with frozen shoulder, however after examination it appears as though patient likely has reduced range of motion secondary to osteoarthritic changes.  Ultrasound yielded glenohumeral joint effusion and rotator cuff tearing.  These are likely related to his osteoarthritis and age-related  changes. -Patient denied any significant pain at this time so he did not offer any injection or additional medications today. -Right shoulder x-ray ordered >> will contact patient with x-ray results. -Provided patient with range of motion and rotator cuff strengthening exercises. -Follow-up as needed  Non-traumatic rotator cuff tear See plan above   Orders Placed This Encounter  Procedures  . DG Shoulder Right    Standing Status:   Future    Number of Occurrences:   1    Standing Expiration Date:   11/20/2018    Order Specific Question:   Reason for Exam (SYMPTOM  OR DIAGNOSIS REQUIRED)    Answer:   right shoulder pain    Order Specific Question:   Preferred imaging location?    Answer:   [facility]    Order Specific Question:   Radiology Contrast Protocol - do NOT remove file path    Answer:   [email or web address]    Elberta Leatherwood, MD,MS [facility] Fellow 09/21/2017 5:21 PM

## 2017-09-21 NOTE — Assessment & Plan Note (Signed)
See plan above.

## 2017-09-24 NOTE — Progress Notes (Signed)
[facility]: Attending Note: I have reviewed the chart, discussed wit the Sports Medicine Fellow. I agree with assessment and treatment plan as detailed in the [place] note. Hughes joint arthritis---not much pain mostly limited ROM. I would rec conservative tx.

## 2017-10-09 DIAGNOSIS — H40013 Open angle with borderline findings, low risk, bilateral: Secondary | ICD-10-CM | POA: Diagnosis not present

## 2017-10-09 DIAGNOSIS — H40053 Ocular hypertension, bilateral: Secondary | ICD-10-CM | POA: Diagnosis not present

## 2017-10-09 DIAGNOSIS — Z961 Presence of intraocular lens: Secondary | ICD-10-CM | POA: Diagnosis not present

## 2017-10-12 ENCOUNTER — Encounter: Payer: Self-pay | Admitting: Family Medicine

## 2017-12-19 ENCOUNTER — Ambulatory Visit: Payer: PRIVATE HEALTH INSURANCE | Admitting: Cardiology

## 2018-01-24 ENCOUNTER — Ambulatory Visit (INDEPENDENT_AMBULATORY_CARE_PROVIDER_SITE_OTHER): Payer: Medicare Other | Admitting: Cardiology

## 2018-01-24 ENCOUNTER — Encounter: Payer: Self-pay | Admitting: Cardiology

## 2018-01-24 VITALS — BP 110/80 | HR 85 | Ht 69.0 in | Wt 194.8 lb

## 2018-01-24 DIAGNOSIS — I2584 Coronary atherosclerosis due to calcified coronary lesion: Secondary | ICD-10-CM

## 2018-01-24 DIAGNOSIS — I2510 Atherosclerotic heart disease of native coronary artery without angina pectoris: Secondary | ICD-10-CM | POA: Diagnosis not present

## 2018-01-24 DIAGNOSIS — R9431 Abnormal electrocardiogram [ECG] [EKG]: Secondary | ICD-10-CM

## 2018-01-24 MED ORDER — ASPIRIN EC 81 MG PO TBEC: 81.0000 mg | DELAYED_RELEASE_TABLET | Freq: Every day | ORAL | 3 refills | Status: DC

## 2018-01-24 NOTE — Progress Notes (Signed)
Cardiology Office Note    Date:  01/24/2018   ID:  Wesley [name], DOB 28-Aug-1940, MRN [id number]  PCP:  Deland Pretty, MD  Cardiologist:   Candee Furbish, MD     History of Present Illness:  Wesley [name] is a 77 y.o. male follow up for abnormal EKG at the request of Dr. Shelia Media.  In review of office note from 02/24/16 from Dr. Shelia Media, an echocardiogram in 2009 was negative, cardiac catheterization was negative in 2008, echocardiogram in 2015 showed mild LVH with EF of 60-65% and CT scans in 2015 showed coronary artery calcification but no aneurysm.  Father died at age 50 of CAD. Nonsmoker.  Because of the calcium noted in the coronary arteries Dr. Shelia Media noted that we need to be more aggressive in treating the cholesterol to keep the situation from progressing. He is on atorvastatin 40 mg and therefore Zetia 10 mg a day was added. He also had an EKG done which showed a change with new Q waves in V1 and V2. He requested a referral to discuss.  EKG from 02/24/16 personally reviewed showing sinus rhythm rate 69 with very small Q wave in V1 and very small Q wave in V2 with no other significant changes.  Overall he is not having any symptoms of angina, no chest pain, no shortness of breath, no syncope, no palpitations. His wife is currently sick with possible pneumonia.  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  01/24/2018-overall been doing quite well, no chest pain fevers chills nausea vomiting syncope.  Take his medications.  He has been laying off of aspirin 81 he was worried about having stomach issues.  He takes ibuprofen 2-3 times a day.  Past Medical History:  Diagnosis Date  . 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 Chronic allergic rhinitis   . Coronary atherosclerosis of native coronary artery   . GERD (gastroesophageal reflux disease)   . High blood pressure   . Hypercholesterolemia   .  Macrocytosis without anemia   . OSA on CPAP   . 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 Sleep apnea   . Umbilical hernia without obstruction or gangrene     Past Surgical History:  Procedure Laterality Date  . CARDIAC CATHETERIZATION  2000   Cath to rule out cardiac problems RT HTN. PT denies significant findings  . CARDIAC CATHETERIZATION  2008  . EYE SURGERY     BILATERAL CATARACT SURGERY WITH LENS IMPLANTS  . INSERTION OF MESH N/A 09/08/2016   Procedure: INSERTION OF MESH;  Surgeon: Jackolyn Confer, MD;  Location: [facility];  Service: General;  Laterality: N/A;  . right rotator cuff    . ROTATOR CUFF REPAIR    . TONSILLECTOMY    . UMBILICAL HERNIA REPAIR N/A 09/08/2016   Procedure: OPEN UMBILICAL HERNIA REPAIR WITH MESH;  Surgeon: Jackolyn Confer, MD;  Location: [facility];  Service: General;  Laterality: N/A;    Current Medications: Outpatient Medications Prior to Visit  Medication Sig Dispense Refill  . atorvastatin (LIPITOR) 80 MG tablet Take 1 tablet (80 mg total) daily at 6 PM by mouth. 90 tablet 3  . cetirizine (ZYRTEC) 10 MG tablet Take 10 mg by mouth daily. ALLERTEC  AT 2 AM DAILY    . Chromium Picolinate 800 MCG TABS Take 1 tablet by mouth daily.    . finasteride (PROSCAR) 5 MG tablet  Take 5 mg by mouth every morning. AT 2 AM DAILY    . fluticasone (FLONASE) 50 MCG/ACT nasal spray Place 2 sprays into both nostrils daily as needed for allergies or rhinitis.    Marland Kitchen ibuprofen (ADVIL,MOTRIN) 200 MG tablet Take 200 mg by mouth every 6 (six) hours as needed.    Marland Kitchen lisinopril (PRINIVIL,ZESTRIL) 10 MG tablet Take 10 mg by mouth every morning. AT 2 AM DAILY    . Magnesium 400 MG TABS Take 1 tablet by mouth daily as needed.    . Omega-3 Fatty Acids (FISH OIL) 1200 MG CAPS Take 1,200 mg by mouth daily. IN AFTERNOON AFTER LUNCH    . vitamin B-12 (CYANOCOBALAMIN) 1000 MCG tablet Take 1,000 mcg by mouth daily. AT 2 AM DAILY     No facility-administered medications prior to visit.       Allergies:   Demerol and Promethazine hcl   Social History   Socioeconomic History  . Marital status: Married    Spouse name: Not on file  . Number of children: Not on file  . Years of education: Not on file  . Highest education level: Not on file  Occupational History  . Not on file  Social Needs  . Financial resource strain: Not on file  . Food insecurity:    Worry: Not on file    Inability: Not on file  . Transportation needs:    Medical: Not on file    Non-medical: Not on file  Tobacco Use  . Smoking status: Never Smoker  . Smokeless tobacco: Never Used  Substance and Sexual Activity  . Alcohol use: Yes    Alcohol/week: 7.0 oz    Types: 14 drink(s) per week    Comment: or more  . Drug use: No  . Sexual activity: Not on file  Lifestyle  . Physical activity:    Days per week: Not on file    Minutes per session: Not on file  . Stress: Not on file  Relationships  . Social connections:    Talks on phone: Not on file    Gets together: Not on file    Attends religious service: Not on file    Active member of club or organization: Not on file    Attends meetings of clubs or organizations: Not on file    Relationship status: Not on file  Other Topics Concern  . Not on file  Social History Narrative  . 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   Family History:  The patient's family history includes CAD in his father; Diabetes in his mother; Healthy in his daughter and son; Melanoma (age of onset: 19) in his brother.   ROS:   Please see the history of present illness.    Review of Systems  All other systems reviewed and are negative.     PHYSICAL EXAM:   VS:  BP 110/80   Pulse 85   Ht 5\' 9"  (1.753 m)   Wt 194 lb 12.8 oz (88.4 kg)   SpO2 96%   BMI 28.77 kg/m    GEN: Well nourished, well developed, in no acute distress  HEENT: normal  Neck: no JVD, carotid bruits, or masses Cardiac: RRR; no murmurs, rubs, or gallops,no edema  Respiratory:  clear to  auscultation bilaterally, normal work of breathing GI: soft, nontender, nondistended, + BS MS: no deformity or atrophy  Skin: warm and dry, no rash Neuro:  Alert and Oriented x 3, Strength and sensation are intact  Psych: euthymic mood, full affect   Wt Readings from Last 3 Encounters:  01/24/18 194 lb 12.8 oz (88.4 kg)  09/21/17 190 lb (86.2 kg)  06/26/17 192 lb 8 oz (87.3 kg)      Studies/Labs Reviewed:   EKG:  EKG is ordered today.  The ekg ordered today demonstrates Sinus rhythm, 70, incomplete right bundle-branch block, small nonpathologic Q-wave in V1, no Q wave in V2. Prior EKG as described above.  Recent Labs: No results found for requested labs within last 8760 hours.   Lipid Panel No results found for: CHOL, TRIG, HDL, CHOLHDL, VLDL, LDLCALC, LDLDIRECT  Additional studies/ records that were reviewed today include:  Prior medical records, EKG, lab work reviewed. LDLs have been running between 99 and 110.    ASSESSMENT:    1. Coronary artery calcification   2. Abnormal ECG      PLAN:  In order of problems listed above:  Coronary artery calcification/atherosclerosis/aortic atherosclerosis  - Showed him CT scan, diffuse LAD calcification, mild proximal circumflex calcification.  - Prior heart catheterization in 2008 was reportedly normal. Since it is been approximately 10 years since last evaluation, we will order a nuclear stress test to see if he has any evidence of ischemia especially in the LAD territory.  - Agree with aggressive primary prevention, aspirin 81 mg.  We will add back aspirin.  Hyperlipidemia  - He has been prescribed Zetia 10 mg which is quite expensive for him, he is splitting the pill.  Continue with atorvastatin.  Abnormal EKG  - Referring EKG demonstrated Q waves in V1 and V2. Most recent EKG shows only a small nonpathologic Q-wave in V1. Otherwise unremarkable. Reassurance. Most recent echocardiogram in 2015 showed normal ejection fraction  with mild left ventricular hypertrophy. Continue with good blood pressure control.  No anginal symptoms.  Overall continue with prevention efforts.  One-year follow-up  Medication Adjustments/Labs and Tests Ordered: Current medicines are reviewed at length with the patient today.  Concerns regarding medicines are outlined above.  Medication changes, Labs and Tests ordered today are listed in the Patient Instructions below. Patient Instructions  Medication Instructions:  Restart ASA 81 mg a day. Continue all other medications as listed.  Follow-Up: Follow up in 1 year with Dr. Marlou Porch.  You will receive a letter in the mail 2 months before you are due.  Please call us when you receive this letter to schedule your follow up appointment.  If you need a refill on your cardiac medications before your next appointment, please call your pharmacy.  Thank you for choosing [facility]!!        Signed, Candee Furbish, MD  01/24/2018 4:52 PM    [address], [place], [place]  [address] Phone: [phone]; Fax: [phone]

## 2018-01-24 NOTE — Patient Instructions (Signed)
Medication Instructions:  Restart ASA 81 mg a day. Continue all other medications as listed.  Follow-Up: Follow up in 1 year with Dr. Marlou Porch.  You will receive a letter in the mail 2 months before you are due.  Please call us when you receive this letter to schedule your follow up appointment.  If you need a refill on your cardiac medications before your next appointment, please call your pharmacy.  Thank you for choosing [facility]!!

## 2018-02-21 DIAGNOSIS — Z79899 Other long term (current) drug therapy: Secondary | ICD-10-CM | POA: Diagnosis not present

## 2018-02-21 DIAGNOSIS — Z125 Encounter for screening for malignant neoplasm of prostate: Secondary | ICD-10-CM | POA: Diagnosis not present

## 2018-02-21 DIAGNOSIS — N401 Enlarged prostate with lower urinary tract symptoms: Secondary | ICD-10-CM | POA: Diagnosis not present

## 2018-02-27 DIAGNOSIS — G629 Polyneuropathy, unspecified: Secondary | ICD-10-CM | POA: Diagnosis not present

## 2018-02-27 DIAGNOSIS — I10 Essential (primary) hypertension: Secondary | ICD-10-CM | POA: Diagnosis not present

## 2018-02-27 DIAGNOSIS — R5383 Other fatigue: Secondary | ICD-10-CM | POA: Diagnosis not present

## 2018-02-27 DIAGNOSIS — N401 Enlarged prostate with lower urinary tract symptoms: Secondary | ICD-10-CM | POA: Diagnosis not present

## 2018-03-05 DIAGNOSIS — E875 Hyperkalemia: Secondary | ICD-10-CM | POA: Diagnosis not present

## 2018-03-11 DIAGNOSIS — L821 Other seborrheic keratosis: Secondary | ICD-10-CM | POA: Diagnosis not present

## 2018-03-11 DIAGNOSIS — L570 Actinic keratosis: Secondary | ICD-10-CM | POA: Diagnosis not present

## 2018-03-11 DIAGNOSIS — Z85828 Personal history of other malignant neoplasm of skin: Secondary | ICD-10-CM | POA: Diagnosis not present

## 2018-03-11 DIAGNOSIS — D1801 Hemangioma of skin and subcutaneous tissue: Secondary | ICD-10-CM | POA: Diagnosis not present

## 2018-04-29 ENCOUNTER — Ambulatory Visit: Payer: Medicare Other | Admitting: Neurology

## 2018-06-04 DIAGNOSIS — Z23 Encounter for immunization: Secondary | ICD-10-CM | POA: Diagnosis not present

## 2018-07-08 ENCOUNTER — Other Ambulatory Visit: Payer: Self-pay | Admitting: Cardiology

## 2018-09-17 DIAGNOSIS — D485 Neoplasm of uncertain behavior of skin: Secondary | ICD-10-CM | POA: Diagnosis not present

## 2018-09-17 DIAGNOSIS — L570 Actinic keratosis: Secondary | ICD-10-CM | POA: Diagnosis not present

## 2018-09-17 DIAGNOSIS — D2271 Melanocytic nevi of right lower limb, including hip: Secondary | ICD-10-CM | POA: Diagnosis not present

## 2018-09-17 DIAGNOSIS — L820 Inflamed seborrheic keratosis: Secondary | ICD-10-CM | POA: Diagnosis not present

## 2018-09-17 DIAGNOSIS — L812 Freckles: Secondary | ICD-10-CM | POA: Diagnosis not present

## 2018-09-17 DIAGNOSIS — Z85828 Personal history of other malignant neoplasm of skin: Secondary | ICD-10-CM | POA: Diagnosis not present

## 2018-09-17 DIAGNOSIS — L821 Other seborrheic keratosis: Secondary | ICD-10-CM | POA: Diagnosis not present

## 2018-09-17 DIAGNOSIS — D1801 Hemangioma of skin and subcutaneous tissue: Secondary | ICD-10-CM | POA: Diagnosis not present

## 2018-09-17 DIAGNOSIS — L918 Other hypertrophic disorders of the skin: Secondary | ICD-10-CM | POA: Diagnosis not present

## 2018-09-19 DIAGNOSIS — Z125 Encounter for screening for malignant neoplasm of prostate: Secondary | ICD-10-CM | POA: Diagnosis not present

## 2018-09-19 DIAGNOSIS — I10 Essential (primary) hypertension: Secondary | ICD-10-CM | POA: Diagnosis not present

## 2018-09-19 DIAGNOSIS — E7800 Pure hypercholesterolemia, unspecified: Secondary | ICD-10-CM | POA: Diagnosis not present

## 2018-09-23 DIAGNOSIS — G4733 Obstructive sleep apnea (adult) (pediatric): Secondary | ICD-10-CM | POA: Diagnosis not present

## 2018-09-23 DIAGNOSIS — M7501 Adhesive capsulitis of right shoulder: Secondary | ICD-10-CM | POA: Diagnosis not present

## 2018-09-23 DIAGNOSIS — N401 Enlarged prostate with lower urinary tract symptoms: Secondary | ICD-10-CM | POA: Diagnosis not present

## 2018-09-23 DIAGNOSIS — J309 Allergic rhinitis, unspecified: Secondary | ICD-10-CM | POA: Diagnosis not present

## 2018-09-23 DIAGNOSIS — I10 Essential (primary) hypertension: Secondary | ICD-10-CM | POA: Diagnosis not present

## 2018-09-23 DIAGNOSIS — Z0000 Encounter for general adult medical examination without abnormal findings: Secondary | ICD-10-CM | POA: Diagnosis not present

## 2018-09-23 DIAGNOSIS — K219 Gastro-esophageal reflux disease without esophagitis: Secondary | ICD-10-CM | POA: Diagnosis not present

## 2018-09-23 DIAGNOSIS — E7800 Pure hypercholesterolemia, unspecified: Secondary | ICD-10-CM | POA: Diagnosis not present

## 2018-09-23 DIAGNOSIS — E559 Vitamin D deficiency, unspecified: Secondary | ICD-10-CM | POA: Diagnosis not present

## 2018-09-30 DIAGNOSIS — R351 Nocturia: Secondary | ICD-10-CM | POA: Diagnosis not present

## 2018-09-30 DIAGNOSIS — N5201 Erectile dysfunction due to arterial insufficiency: Secondary | ICD-10-CM | POA: Diagnosis not present

## 2018-09-30 DIAGNOSIS — N403 Nodular prostate with lower urinary tract symptoms: Secondary | ICD-10-CM | POA: Diagnosis not present

## 2018-09-30 DIAGNOSIS — R972 Elevated prostate specific antigen [PSA]: Secondary | ICD-10-CM | POA: Diagnosis not present

## 2019-01-13 ENCOUNTER — Telehealth: Payer: Self-pay

## 2019-01-13 ENCOUNTER — Telehealth (INDEPENDENT_AMBULATORY_CARE_PROVIDER_SITE_OTHER): Payer: Medicare Other | Admitting: Cardiology

## 2019-01-13 ENCOUNTER — Encounter: Payer: Self-pay | Admitting: Cardiology

## 2019-01-13 ENCOUNTER — Other Ambulatory Visit: Payer: Self-pay

## 2019-01-13 VITALS — BP 124/74 | HR 68 | Ht 69.0 in | Wt 189.0 lb

## 2019-01-13 DIAGNOSIS — I2510 Atherosclerotic heart disease of native coronary artery without angina pectoris: Secondary | ICD-10-CM | POA: Diagnosis not present

## 2019-01-13 DIAGNOSIS — E782 Mixed hyperlipidemia: Secondary | ICD-10-CM

## 2019-01-13 DIAGNOSIS — I2584 Coronary atherosclerosis due to calcified coronary lesion: Secondary | ICD-10-CM | POA: Diagnosis not present

## 2019-01-13 DIAGNOSIS — I10 Essential (primary) hypertension: Secondary | ICD-10-CM

## 2019-01-13 MED ORDER — ASPIRIN EC 81 MG PO TBEC: 81.0000 mg | DELAYED_RELEASE_TABLET | ORAL | Status: DC

## 2019-01-13 NOTE — Telephone Encounter (Signed)
YOUR CARDIOLOGY TEAM HAS ARRANGED FOR AN E-VISIT FOR YOUR APPOINTMENT - PLEASE REVIEW IMPORTANT INFORMATION BELOW SEVERAL DAYS PRIOR TO YOUR APPOINTMENT  Due to the recent COVID-19 pandemic, we are transitioning in-person office visits to tele-medicine visits in an effort to decrease unnecessary exposure to our patients, their families, and staff. These visits are billed to your insurance just like a normal visit is. We also encourage you to sign up for MyChart if you have not already done so. You will need a smartphone if possible. For patients that do not have this, we can still complete the visit using a regular telephone but do prefer a smartphone to enable video when possible. You may have a family member that lives with you that can help. If possible, we also ask that you have a blood pressure cuff and scale at home to measure your blood pressure, heart rate and weight prior to your scheduled appointment. Patients with clinical needs that need an in-person evaluation and testing will still be able to come to the office if absolutely necessary. If you have any questions, feel free to call our office.     YOUR PROVIDER WILL BE USING THE FOLLOWING PLATFORM TO COMPLETE YOUR VISIT: [email or web address]  . IF USING MYCHART - How to Download the MyChart App to Your SmartPhone   - If Apple, go to App Store and type in MyChart in the search bar and download the app. If Android, ask patient to go to Google Play Store and type in MyChart in the search bar and download the app. The app is free but as with any other app downloads, your phone may require you to verify saved payment information or Apple/Android password.  - You will need to then log into the app with your MyChart username and password, and select [facility] as your healthcare provider to link the account.  - When it is time for your visit, go to the MyChart app, find appointments, and click Begin Video Visit. Be sure to Select Allow for your device to  access the Microphone and Camera for your visit. You will then be connected, and your provider will be with you shortly.  **If you have any issues connecting or need assistance, please contact MyChart service desk [phone] ([phone])**  **If using a computer, in order to ensure the best quality for your visit, you will need to use either of the following Internet Browsers: Google Chrome or Microsoft Edge**  . IF USING DOXIMITY or [email or web address] - The staff will give you instructions on receiving your link to join the meeting the day of your visit.      2-3 DAYS BEFORE YOUR APPOINTMENT  You will receive a telephone call from one of our [facility] team members - your caller ID may say "Unknown caller." If this is a video visit, we will walk you through how to get the video launched on your phone. We will remind you check your blood pressure, heart rate and weight prior to your scheduled appointment. If you have an Apple Watch or Kardia, please upload any pertinent ECG strips the day before or morning of your appointment to MyChart. Our staff will also make sure you have reviewed the consent and agree to move forward with your scheduled tele-health visit.     THE DAY OF YOUR APPOINTMENT  Approximately 15 minutes prior to your scheduled appointment, you will receive a telephone call from one of Heart[facility] team - your caller ID may say "Unknown caller."    Our staff will confirm medications, vital signs for the day and any symptoms you may be experiencing. Please have this information available prior to the time of visit start. It may also be helpful for you to have a pad of paper and pen handy for any instructions given during your visit. They will also walk you through joining the smartphone meeting if this is a video visit.    CONSENT FOR TELE-HEALTH VISIT - PLEASE REVIEW  I hereby voluntarily request, consent and authorize [facility] and its employed or contracted physicians, physician  assistants, nurse practitioners or other licensed health care professionals (the Practitioner), to provide me with telemedicine health care services (the "Services") as deemed necessary by the treating Practitioner. I acknowledge and consent to receive the Services by the Practitioner via telemedicine. I understand that the telemedicine visit will involve communicating with the Practitioner through live audiovisual communication technology and the disclosure of certain medical information by electronic transmission. I acknowledge that I have been given the opportunity to request an in-person assessment or other available alternative prior to the telemedicine visit and am voluntarily participating in the telemedicine visit.  I understand that I have the right to withhold or withdraw my consent to the use of telemedicine in the course of my care at any time, without affecting my right to future care or treatment, and that the Practitioner or I may terminate the telemedicine visit at any time. I understand that I have the right to inspect all information obtained and/or recorded in the course of the telemedicine visit and may receive copies of available information for a reasonable fee.  I understand that some of the potential risks of receiving the Services via telemedicine include:  . Delay or interruption in medical evaluation due to technological equipment failure or disruption; . Information transmitted may not be sufficient (e.g. poor resolution of images) to allow for appropriate medical decision making by the Practitioner; and/or  . In rare instances, security protocols could fail, causing a breach of personal health information.  Furthermore, I acknowledge that it is my responsibility to provide information about my medical history, conditions and care that is complete and accurate to the best of my ability. I acknowledge that Practitioner's advice, recommendations, and/or decision may be based on  factors not within their control, such as incomplete or inaccurate data provided by me or distortions of diagnostic images or specimens that may result from electronic transmissions. I understand that the practice of medicine is not an exact science and that Practitioner makes no warranties or guarantees regarding treatment outcomes. I acknowledge that I will receive a copy of this consent concurrently upon execution via email to the email address I last provided but may also request a printed copy by calling the office of [facility].    I understand that my insurance will be billed for this visit.   I have read or had this consent read to me. . I understand the contents of this consent, which adequately explains the benefits and risks of the Services being provided via telemedicine.  . I have been provided ample opportunity to ask questions regarding this consent and the Services and have had my questions answered to my satisfaction. . I give my informed consent for the services to be provided through the use of telemedicine in my medical care  By participating in this telemedicine visit I agree to the above.  

## 2019-01-13 NOTE — Progress Notes (Signed)
Virtual Visit via Video Note   This visit type was conducted due to national recommendations for restrictions regarding the COVID-19 Pandemic (e.g. social distancing) in an effort to limit this patient's exposure and mitigate transmission in our community.  Due to his co-morbid illnesses, this patient is at least at moderate risk for complications without adequate follow up.  This format is felt to be most appropriate for this patient at this time.  All issues noted in this document were discussed and addressed.  A limited physical exam was performed with this format.  Please refer to the patient's chart for his consent to telehealth for [facility].   Date:  01/13/2019   ID:  Wesley [name], DOB 1941/06/09, MRN [id number]  Patient Location: Home Provider Location: Home  PCP:  Deland Pretty, MD  Cardiologist:  Candee Furbish, MD  Electrophysiologist:  None   Evaluation Performed:  Follow-Up Visit  Chief Complaint: Coronary artery calcification follow-up  History of Present Illness:    Wesley [name] is a 78 y.o. male with abnormal ECG follow up  In review of office note from 02/24/16 from Dr. Shelia Media, an echocardiogram in 2009 was negative, cardiac catheterization was negative in 2008, echocardiogram in 2015 showed mild LVH with EF of 60-65% and CT scans in 2015 showed coronary artery calcification but no aneurysm.  Father died at age 78 of CAD. Nonsmoker.  Because of the calcium noted in the coronary arteries Dr. Shelia Media noted that we need to be more aggressive in treating the cholesterol to keep the situation from progressing. He is on atorvastatin 40 mg and therefore Zetia 10 mg a day was added. He also had an EKG done which showed a change with new Q waves in V1 and V2. He requested a referral to discuss.  EKG from 02/24/16 personally reviewed showing sinus rhythm rate 69 with very small Q wave in V1 and very small Q wave in V2 with no other significant changes.  Overall he is  not having any symptoms of angina, no chest pain, no shortness of breath, no syncope, no palpitations. His wife is currently sick with possible pneumonia.  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  01/24/2018-overall been doing quite well, no chest pain fevers chills nausea vomiting syncope.  Take his medications.  He has been laying off of aspirin 81 he was worried about having stomach issues.  He takes ibuprofen 2-3 times a day. The patient does not have symptoms concerning for COVID-19 infection (fever, chills, cough, or new shortness of breath).   01/13/19 - bike on porch, riding that, doing some strength training..  Overall been feeling well without any chest pain no anginal symptoms no shortness of breath.  He did stop taking low-dose aspirin again about 3 months ago but I urged him to resume given his coronary atherosclerosis.  He also had stopped taking the Zetia, is taking atorvastatin only.  No myalgias.  No bleeding. Dr. Shelia Media has been monitoring labs.   Past Medical History:  Diagnosis Date  . 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 Chronic allergic rhinitis   . Coronary atherosclerosis of native coronary artery   . GERD (gastroesophageal reflux disease)   . High blood pressure   . Hypercholesterolemia   . Macrocytosis without anemia   . OSA on CPAP   . 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 Sleep apnea   .  Umbilical hernia without obstruction or gangrene    Past Surgical History:  Procedure Laterality Date  . CARDIAC CATHETERIZATION  2000   Cath to rule out cardiac problems RT HTN. PT denies significant findings  . CARDIAC CATHETERIZATION  2008  . EYE SURGERY     BILATERAL CATARACT SURGERY WITH LENS IMPLANTS  . INSERTION OF MESH N/A 09/08/2016   Procedure: INSERTION OF MESH;  Surgeon: Jackolyn Confer, MD;  Location: [facility];  Service: General;  Laterality: N/A;  . right rotator cuff     . ROTATOR CUFF REPAIR    . TONSILLECTOMY    . UMBILICAL HERNIA REPAIR N/A 09/08/2016   Procedure: OPEN UMBILICAL HERNIA REPAIR WITH MESH;  Surgeon: Jackolyn Confer, MD;  Location: [facility];  Service: General;  Laterality: N/A;     Current Meds  Medication Sig  . atorvastatin (LIPITOR) 80 MG tablet TAKE 1 TABLET EVERY DAY  AT  6  PM  . cetirizine (ZYRTEC) 10 MG tablet Take 10 mg by mouth daily. ALLERTEC  AT 2 AM DAILY  . Chromium Picolinate 800 MCG TABS Take 1 tablet by mouth daily.  . finasteride (PROSCAR) 5 MG tablet Take 5 mg by mouth every morning. AT 2 AM DAILY  . fluticasone (FLONASE) 50 MCG/ACT nasal spray Place 2 sprays into both nostrils daily as needed for allergies or rhinitis.  Marland Kitchen ibuprofen (ADVIL,MOTRIN) 200 MG tablet Take 200 mg by mouth every 6 (six) hours as needed.  Marland Kitchen lisinopril (PRINIVIL,ZESTRIL) 10 MG tablet Take 10 mg by mouth every morning. AT 2 AM DAILY  . Magnesium 400 MG TABS Take 1 tablet by mouth daily as needed.  . Omega-3 Fatty Acids (FISH OIL) 1200 MG CAPS Take 1,200 mg by mouth daily. IN AFTERNOON AFTER LUNCH  . vitamin B-12 (CYANOCOBALAMIN) 1000 MCG tablet Take 1,000 mcg by mouth daily. AT 2 AM DAILY     Allergies:   Demerol and Promethazine hcl   Social History   Tobacco Use  . Smoking status: Never Smoker  . Smokeless tobacco: Never Used  Substance Use Topics  . Alcohol use: Yes    Alcohol/week: 14.0 standard drinks    Types: 14 drink(s) per week    Comment: or more  . Drug use: No     Family Hx: The patient's family history includes CAD in his father; Diabetes in his mother; Healthy in his daughter and son; Melanoma (age of onset: 19) in his brother.  ROS:   Please see the history of present illness.    Denies any fevers chills nausea vomiting syncope bleeding All other systems reviewed and are negative.   Prior CV studies:   The following studies were reviewed today:  ECHO 2015 - normal EF  Labs/Other Tests and Data Reviewed:    EKG:   An ECG dated 01/24/18 was personally reviewed today and demonstrated:  RBBB, NSR  Recent Labs: No results found for requested labs within last 8760 hours.   Recent Lipid Panel No results found for: CHOL, TRIG, HDL, CHOLHDL, LDLCALC, LDLDIRECT  Wt Readings from Last 3 Encounters:  01/13/19 189 lb (85.7 kg)  01/24/18 194 lb 12.8 oz (88.4 kg)  09/21/17 190 lb (86.2 kg)     Objective:    Vital Signs:  BP 124/74   Pulse 68   Ht 5\' 9"  (1.753 m)   Wt 189 lb (85.7 kg)   BMI 27.91 kg/m    VITAL SIGNS:  reviewed GEN:  no acute distress EYES:  sclerae anicteric,  EOMI - Extraocular Movements Intact RESPIRATORY:  normal respiratory effort, symmetric expansion SKIN:  no rash, lesions or ulcers. MUSCULOSKELETAL:  no obvious deformities. NEURO:  alert and oriented x 3, no obvious focal deficit PSYCH:  normal affect  ASSESSMENT & PLAN:     Coronary artery calcification/atherosclerosis/aortic atherosclerosis  - Showed him CT scan, diffuse LAD calcification, mild proximal circumflex calcification.  - Prior heart catheterization in 2008 was reportedly normal. Since it is been approximately 10 years since last evaluation, we will order a nuclear stress test to see if he has any evidence of ischemia especially in the LAD territory.  - Agree with aggressive primary prevention, aspirin 81 mg. Stopped about 3 months ago.    - No changes  Hyperlipidemia  -  Continue with atorvastatin. No changes. No myalgias.  Essential HTN  - 112/64 - 135/76  Abnormal EKG  - Referring EKG demonstrated Q waves in V1 and V2. Most recent EKG shows only a small nonpathologic Q-wave in V1. Otherwise unremarkable. Reassurance. Most recent echocardiogram in 2015 showed normal ejection fraction with mild left ventricular hypertrophy. Continue with good blood pressure control.  No anginal symptoms.  Overall continue with prevention efforts. No changes mage.    COVID-19 Education: The signs and symptoms of  COVID-19 were discussed with the patient and how to seek care for testing (follow up with PCP or arrange E-visit).  The importance of social distancing was discussed today.  Time:   Today, I have spent 15 minutes with the patient with telehealth technology discussing the above problems.     Medication Adjustments/Labs and Tests Ordered: Current medicines are reviewed at length with the patient today.  Concerns regarding medicines are outlined above.   Tests Ordered: No orders of the defined types were placed in this encounter.   Medication Changes: Meds ordered this encounter  Medications  . aspirin EC 81 MG tablet    Sig: Take 1 tablet (81 mg total) by mouth every other day.    Disposition:  Follow up in 1 year(s)  Signed, Candee Furbish, MD  01/13/2019 3:56 PM    [place]

## 2019-01-13 NOTE — Patient Instructions (Signed)
Medication Instructions:  Please take Aspirin 81 mg one tablet every other day. Continue all other medications as listed.  If you need a refill on your cardiac medications before your next appointment, please call your pharmacy.   Follow-Up: Follow up in 1 year with Dr. Marlou Porch.  You will receive a letter in the mail 2 months before you are due.  Please call us when you receive this letter to schedule your follow up appointment.  Thank you for choosing [facility]!!

## 2019-01-20 ENCOUNTER — Ambulatory Visit: Payer: Medicare Other | Admitting: Cardiology

## 2019-03-18 DIAGNOSIS — D225 Melanocytic nevi of trunk: Secondary | ICD-10-CM | POA: Diagnosis not present

## 2019-03-18 DIAGNOSIS — D1801 Hemangioma of skin and subcutaneous tissue: Secondary | ICD-10-CM | POA: Diagnosis not present

## 2019-03-18 DIAGNOSIS — L570 Actinic keratosis: Secondary | ICD-10-CM | POA: Diagnosis not present

## 2019-03-18 DIAGNOSIS — Z85828 Personal history of other malignant neoplasm of skin: Secondary | ICD-10-CM | POA: Diagnosis not present

## 2019-03-18 DIAGNOSIS — L821 Other seborrheic keratosis: Secondary | ICD-10-CM | POA: Diagnosis not present

## 2019-05-12 DIAGNOSIS — Z23 Encounter for immunization: Secondary | ICD-10-CM | POA: Diagnosis not present

## 2019-06-06 ENCOUNTER — Other Ambulatory Visit: Payer: Self-pay | Admitting: Cardiology

## 2019-06-18 DIAGNOSIS — H40053 Ocular hypertension, bilateral: Secondary | ICD-10-CM | POA: Diagnosis not present

## 2019-06-18 DIAGNOSIS — Z961 Presence of intraocular lens: Secondary | ICD-10-CM | POA: Diagnosis not present

## 2019-06-18 DIAGNOSIS — H40013 Open angle with borderline findings, low risk, bilateral: Secondary | ICD-10-CM | POA: Diagnosis not present

## 2019-06-18 DIAGNOSIS — H52203 Unspecified astigmatism, bilateral: Secondary | ICD-10-CM | POA: Diagnosis not present

## 2019-09-04 DIAGNOSIS — K9289 Other specified diseases of the digestive system: Secondary | ICD-10-CM | POA: Diagnosis not present

## 2019-09-08 ENCOUNTER — Ambulatory Visit: Payer: Medicare Other | Attending: Internal Medicine

## 2019-09-08 DIAGNOSIS — Z23 Encounter for immunization: Secondary | ICD-10-CM | POA: Insufficient documentation

## 2019-09-08 NOTE — Progress Notes (Signed)
   Covid-19 Vaccination Clinic  Name:  Wesley [name]    MRN: [id number] DOB: March 02, 1941  09/08/2019  [name] was observed post Covid-19 immunization for 15 minutes without incidence. He was provided with Vaccine Information Sheet and instruction to access the V-Safe system.   [name] was instructed to call 911 with any severe reactions post vaccine: Marland Kitchen Difficulty breathing  . Swelling of your face and throat  . A fast heartbeat  . A bad rash all over your body  . Dizziness and weakness    Immunizations Administered    Name Date Dose VIS Date Route   Pfizer COVID-19 Vaccine 09/08/2019 11:43 AM 0.3 mL 07/25/2019 Intramuscular   Manufacturer: [place]   Lot: [id number]   [place]: [id number]

## 2019-09-29 ENCOUNTER — Ambulatory Visit: Payer: Medicare Other | Attending: Internal Medicine

## 2019-09-29 DIAGNOSIS — Z23 Encounter for immunization: Secondary | ICD-10-CM

## 2019-09-29 NOTE — Progress Notes (Signed)
   Covid-19 Vaccination Clinic  Name:  Wesley [name]    MRN: [id number] DOB: April 21, 1941  09/29/2019  [name] was observed post Covid-19 immunization for 15 minutes without incidence. He was provided with Vaccine Information Sheet and instruction to access the V-Safe system.   [name] was instructed to call 911 with any severe reactions post vaccine: Marland Kitchen Difficulty breathing  . Swelling of your face and throat  . A fast heartbeat  . A bad rash all over your body  . Dizziness and weakness    Immunizations Administered    Name Date Dose VIS Date Route   Pfizer COVID-19 Vaccine 09/29/2019 11:40 AM 0.3 mL 07/25/2019 Intramuscular   Manufacturer: [place]   Lot: [id number]   [place]: [id number]

## 2019-10-27 DIAGNOSIS — K9289 Other specified diseases of the digestive system: Secondary | ICD-10-CM | POA: Diagnosis not present

## 2019-10-27 DIAGNOSIS — R140 Abdominal distension (gaseous): Secondary | ICD-10-CM | POA: Diagnosis not present

## 2019-10-28 ENCOUNTER — Inpatient Hospital Stay (HOSPITAL_COMMUNITY)
Admission: EM | Admit: 2019-10-28 | Discharge: 2019-11-05 | DRG: 329 | Disposition: A | Discharge status: Home Health | Payer: Medicare Other | Attending: Internal Medicine | Admitting: Internal Medicine

## 2019-10-28 ENCOUNTER — Emergency Department (HOSPITAL_COMMUNITY): Payer: Medicare Other

## 2019-10-28 ENCOUNTER — Encounter: Payer: Self-pay | Admitting: Gastroenterology

## 2019-10-28 ENCOUNTER — Other Ambulatory Visit: Payer: Self-pay

## 2019-10-28 ENCOUNTER — Encounter (HOSPITAL_COMMUNITY): Payer: Self-pay

## 2019-10-28 DIAGNOSIS — I10 Essential (primary) hypertension: Secondary | ICD-10-CM | POA: Diagnosis present

## 2019-10-28 DIAGNOSIS — D374 Neoplasm of uncertain behavior of colon: Secondary | ICD-10-CM | POA: Diagnosis not present

## 2019-10-28 DIAGNOSIS — K6389 Other specified diseases of intestine: Secondary | ICD-10-CM | POA: Diagnosis not present

## 2019-10-28 DIAGNOSIS — K5651 Intestinal adhesions [bands], with partial obstruction: Secondary | ICD-10-CM | POA: Diagnosis not present

## 2019-10-28 DIAGNOSIS — Z833 Family history of diabetes mellitus: Secondary | ICD-10-CM | POA: Diagnosis not present

## 2019-10-28 DIAGNOSIS — R112 Nausea with vomiting, unspecified: Secondary | ICD-10-CM | POA: Diagnosis not present

## 2019-10-28 DIAGNOSIS — R52 Pain, unspecified: Secondary | ICD-10-CM | POA: Diagnosis not present

## 2019-10-28 DIAGNOSIS — G4733 Obstructive sleep apnea (adult) (pediatric): Secondary | ICD-10-CM | POA: Diagnosis present

## 2019-10-28 DIAGNOSIS — Z85828 Personal history of other malignant neoplasm of skin: Secondary | ICD-10-CM

## 2019-10-28 DIAGNOSIS — Z933 Colostomy status: Secondary | ICD-10-CM | POA: Diagnosis not present

## 2019-10-28 DIAGNOSIS — E785 Hyperlipidemia, unspecified: Secondary | ICD-10-CM | POA: Diagnosis present

## 2019-10-28 DIAGNOSIS — J309 Allergic rhinitis, unspecified: Secondary | ICD-10-CM | POA: Diagnosis present

## 2019-10-28 DIAGNOSIS — Z888 Allergy status to other drugs, medicaments and biological substances status: Secondary | ICD-10-CM | POA: Diagnosis not present

## 2019-10-28 DIAGNOSIS — K8020 Calculus of gallbladder without cholecystitis without obstruction: Secondary | ICD-10-CM | POA: Diagnosis not present

## 2019-10-28 DIAGNOSIS — Z20822 Contact with and (suspected) exposure to covid-19: Secondary | ICD-10-CM | POA: Diagnosis present

## 2019-10-28 DIAGNOSIS — E039 Hypothyroidism, unspecified: Secondary | ICD-10-CM | POA: Diagnosis present

## 2019-10-28 DIAGNOSIS — Z885 Allergy status to narcotic agent status: Secondary | ICD-10-CM

## 2019-10-28 DIAGNOSIS — N401 Enlarged prostate with lower urinary tract symptoms: Secondary | ICD-10-CM | POA: Diagnosis present

## 2019-10-28 DIAGNOSIS — K659 Peritonitis, unspecified: Secondary | ICD-10-CM | POA: Diagnosis not present

## 2019-10-28 DIAGNOSIS — Z9842 Cataract extraction status, left eye: Secondary | ICD-10-CM | POA: Diagnosis not present

## 2019-10-28 DIAGNOSIS — K219 Gastro-esophageal reflux disease without esophagitis: Secondary | ICD-10-CM | POA: Diagnosis present

## 2019-10-28 DIAGNOSIS — I2510 Atherosclerotic heart disease of native coronary artery without angina pectoris: Secondary | ICD-10-CM | POA: Diagnosis present

## 2019-10-28 DIAGNOSIS — D72828 Other elevated white blood cell count: Secondary | ICD-10-CM | POA: Diagnosis not present

## 2019-10-28 DIAGNOSIS — R1084 Generalized abdominal pain: Secondary | ICD-10-CM | POA: Diagnosis not present

## 2019-10-28 DIAGNOSIS — R188 Other ascites: Secondary | ICD-10-CM | POA: Diagnosis present

## 2019-10-28 DIAGNOSIS — Z408 Encounter for other prophylactic surgery: Secondary | ICD-10-CM | POA: Diagnosis not present

## 2019-10-28 DIAGNOSIS — K56600 Partial intestinal obstruction, unspecified as to cause: Secondary | ICD-10-CM | POA: Diagnosis not present

## 2019-10-28 DIAGNOSIS — R000 Tachycardia, unspecified: Secondary | ICD-10-CM | POA: Diagnosis not present

## 2019-10-28 DIAGNOSIS — K56609 Unspecified intestinal obstruction, unspecified as to partial versus complete obstruction: Secondary | ICD-10-CM | POA: Diagnosis not present

## 2019-10-28 DIAGNOSIS — K5140 Inflammatory polyps of colon without complications: Secondary | ICD-10-CM | POA: Diagnosis not present

## 2019-10-28 DIAGNOSIS — R351 Nocturia: Secondary | ICD-10-CM | POA: Diagnosis present

## 2019-10-28 DIAGNOSIS — R140 Abdominal distension (gaseous): Secondary | ICD-10-CM | POA: Diagnosis not present

## 2019-10-28 DIAGNOSIS — Z79899 Other long term (current) drug therapy: Secondary | ICD-10-CM

## 2019-10-28 DIAGNOSIS — R42 Dizziness and giddiness: Secondary | ICD-10-CM | POA: Diagnosis not present

## 2019-10-28 DIAGNOSIS — D490 Neoplasm of unspecified behavior of digestive system: Secondary | ICD-10-CM | POA: Diagnosis not present

## 2019-10-28 DIAGNOSIS — E876 Hypokalemia: Secondary | ICD-10-CM | POA: Diagnosis present

## 2019-10-28 DIAGNOSIS — Z03818 Encounter for observation for suspected exposure to other biological agents ruled out: Secondary | ICD-10-CM | POA: Diagnosis not present

## 2019-10-28 DIAGNOSIS — I491 Atrial premature depolarization: Secondary | ICD-10-CM | POA: Diagnosis not present

## 2019-10-28 DIAGNOSIS — Z808 Family history of malignant neoplasm of other organs or systems: Secondary | ICD-10-CM

## 2019-10-28 DIAGNOSIS — Z961 Presence of intraocular lens: Secondary | ICD-10-CM | POA: Diagnosis present

## 2019-10-28 DIAGNOSIS — R1030 Lower abdominal pain, unspecified: Secondary | ICD-10-CM | POA: Diagnosis not present

## 2019-10-28 DIAGNOSIS — Z8249 Family history of ischemic heart disease and other diseases of the circulatory system: Secondary | ICD-10-CM | POA: Diagnosis not present

## 2019-10-28 DIAGNOSIS — Z9841 Cataract extraction status, right eye: Secondary | ICD-10-CM | POA: Diagnosis not present

## 2019-10-28 DIAGNOSIS — K660 Peritoneal adhesions (postprocedural) (postinfection): Secondary | ICD-10-CM | POA: Diagnosis not present

## 2019-10-28 LAB — CBC WITH DIFFERENTIAL/PLATELET
Abs Immature Granulocytes: 0.03 K/uL (ref 0.00–0.07)
Basophils Absolute: 0 K/uL (ref 0.0–0.1)
Basophils Relative: 0 %
Eosinophils Absolute: 0 K/uL (ref 0.0–0.5)
Eosinophils Relative: 0 %
HCT: 42.9 % (ref 39.0–52.0)
Hemoglobin: 14.4 g/dL (ref 13.0–17.0)
Immature Granulocytes: 0 %
Lymphocytes Relative: 9 %
Lymphs Abs: 0.8 K/uL (ref 0.7–4.0)
MCH: 33.2 pg (ref 26.0–34.0)
MCHC: 33.6 g/dL (ref 30.0–36.0)
MCV: 98.8 fL (ref 80.0–100.0)
Monocytes Absolute: 1 K/uL (ref 0.1–1.0)
Monocytes Relative: 12 %
Neutro Abs: 6.8 K/uL (ref 1.7–7.7)
Neutrophils Relative %: 79 %
Platelets: 200 K/uL (ref 150–400)
RBC: 4.34 MIL/uL (ref 4.22–5.81)
RDW: 13.2 % (ref 11.5–15.5)
WBC: 8.6 K/uL (ref 4.0–10.5)
nRBC: 0 % (ref 0.0–0.2)

## 2019-10-28 LAB — RESPIRATORY PANEL BY RT PCR (FLU A&B, COVID)
Influenza A by PCR: NEGATIVE
Influenza B by PCR: NEGATIVE
SARS Coronavirus 2 by RT PCR: NEGATIVE

## 2019-10-28 LAB — URINALYSIS, ROUTINE W REFLEX MICROSCOPIC
Bacteria, UA: NONE SEEN
Bilirubin Urine: NEGATIVE
Glucose, UA: NEGATIVE mg/dL
Ketones, ur: 20 mg/dL — AB
Leukocytes,Ua: NEGATIVE
Nitrite: NEGATIVE
Protein, ur: NEGATIVE mg/dL
Specific Gravity, Urine: 1.034 — ABNORMAL HIGH (ref 1.005–1.030)
pH: 5 (ref 5.0–8.0)

## 2019-10-28 LAB — COMPREHENSIVE METABOLIC PANEL WITH GFR
ALT: 15 U/L (ref 0–44)
AST: 18 U/L (ref 15–41)
Albumin: 3.4 g/dL — ABNORMAL LOW (ref 3.5–5.0)
Alkaline Phosphatase: 72 U/L (ref 38–126)
Anion gap: 11 (ref 5–15)
BUN: 14 mg/dL (ref 8–23)
CO2: 21 mmol/L — ABNORMAL LOW (ref 22–32)
Calcium: 7.7 mg/dL — ABNORMAL LOW (ref 8.9–10.3)
Chloride: 107 mmol/L (ref 98–111)
Creatinine, Ser: 0.57 mg/dL — ABNORMAL LOW (ref 0.61–1.24)
GFR calc Af Amer: >60 mL/min
GFR calc non Af Amer: >60 mL/min
Glucose, Bld: 110 mg/dL — ABNORMAL HIGH (ref 70–99)
Potassium: 3 mmol/L — ABNORMAL LOW (ref 3.5–5.1)
Sodium: 139 mmol/L (ref 135–145)
Total Bilirubin: 0.9 mg/dL (ref 0.3–1.2)
Total Protein: 6.2 g/dL — ABNORMAL LOW (ref 6.5–8.1)

## 2019-10-28 LAB — LIPASE, BLOOD: Lipase: 20 U/L (ref 11–51)

## 2019-10-28 IMAGING — CT CT ABD-PELV W/ CM
2 of 5 series · 15 of 46 positions shown, 17 images · IV contrast (APPLIED)
Comparison: [DATE]

CLINICAL DATA: Suspect diverticulitis. Acute abdominal pain the
past 3 days.

EXAM:
CT ABDOMEN AND PELVIS WITH CONTRAST
TECHNIQUE: Multidetector CT imaging of the abdomen and pelvis was performed
using the standard protocol following bolus administration of
intravenous contrast. Automatic exposure control utilized.
CONTRAST:  OMNIPAQUE IOHEXOL 350 MG/ML SOLN, 100mL OMNIPAQUE IOHEXOL
300 MG/ML SOLN

[Series 2: axial st · axial · 0.86mm/px · z∈[-722,-337]mm · 12 of 92 slices shown, 14 images]
[im 8/92  soft-tissue]
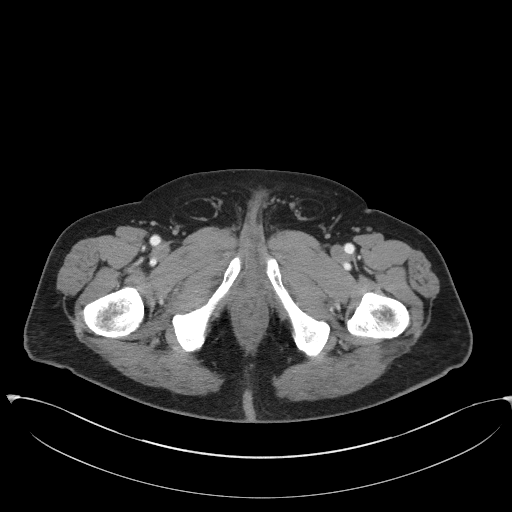
[im 8/92  bone]
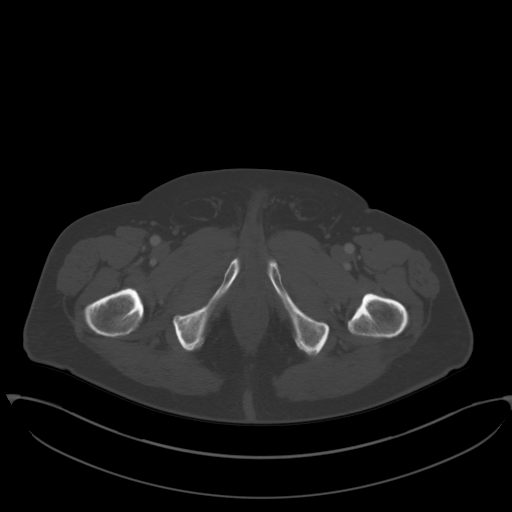
[im 15/92  soft-tissue]
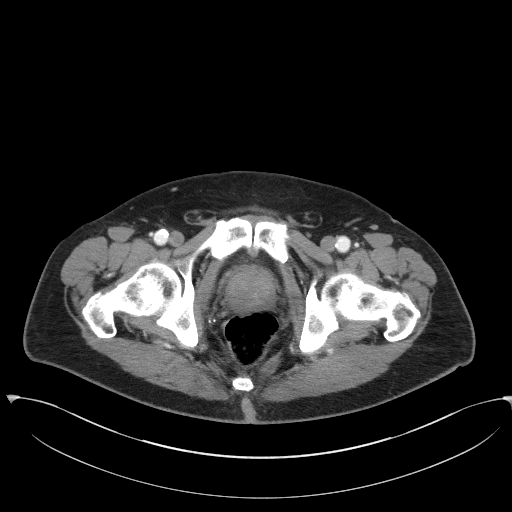
[im 22/92  soft-tissue]
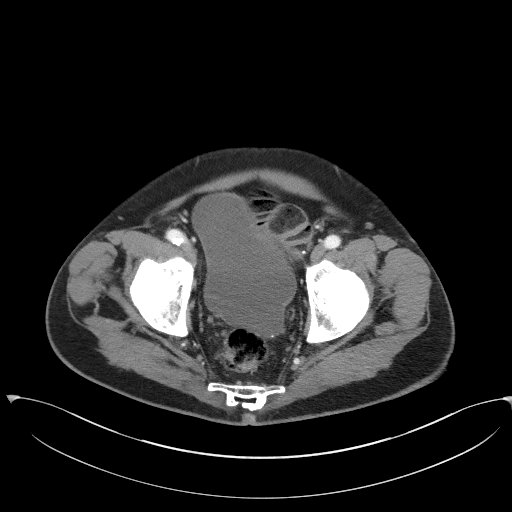
[im 29/92  soft-tissue]
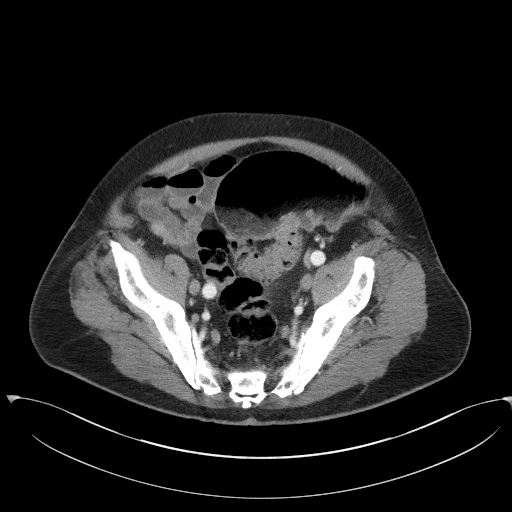
[im 36/92  soft-tissue]
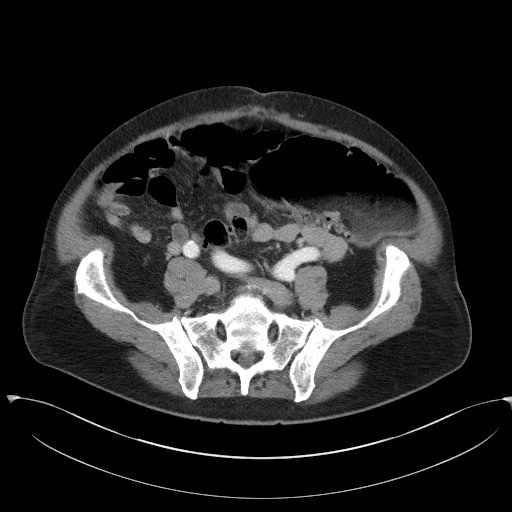
[im 43/92  soft-tissue]
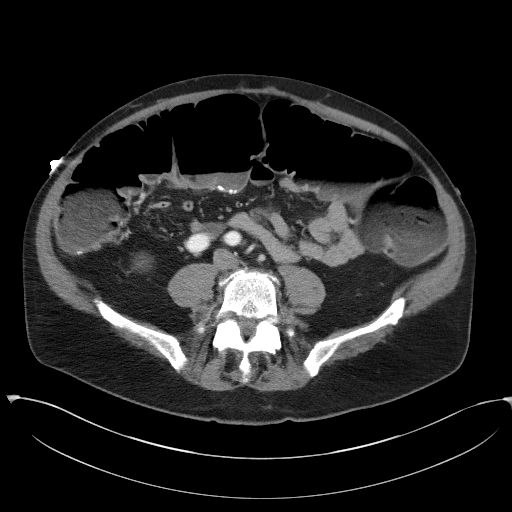
[im 50/92  soft-tissue]
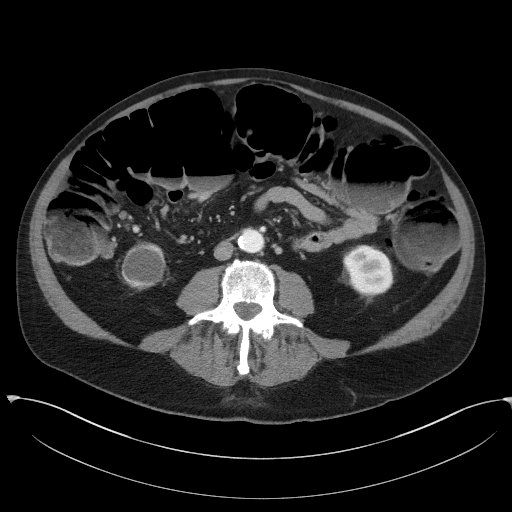
[im 57/92  soft-tissue]
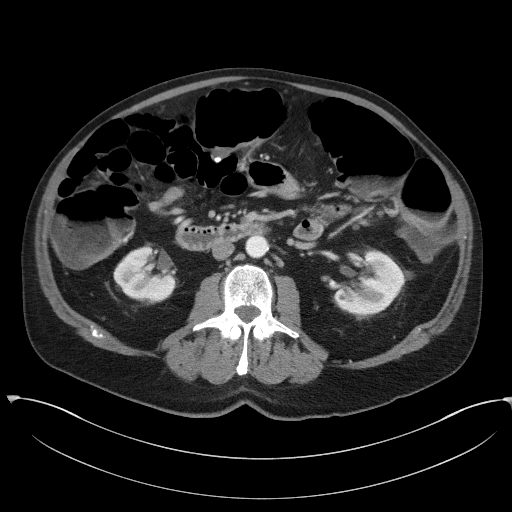
[im 64/92  soft-tissue]
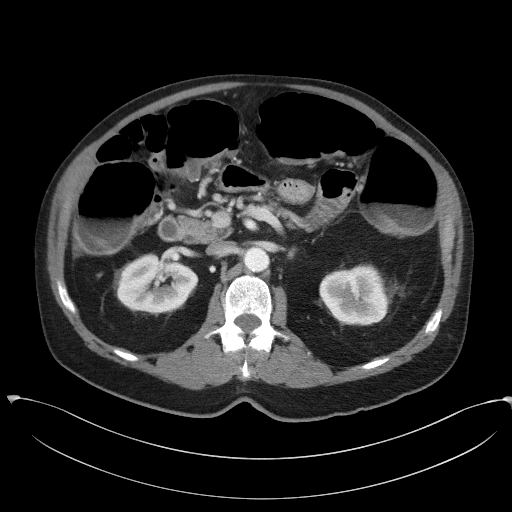
[im 64/92  bone]
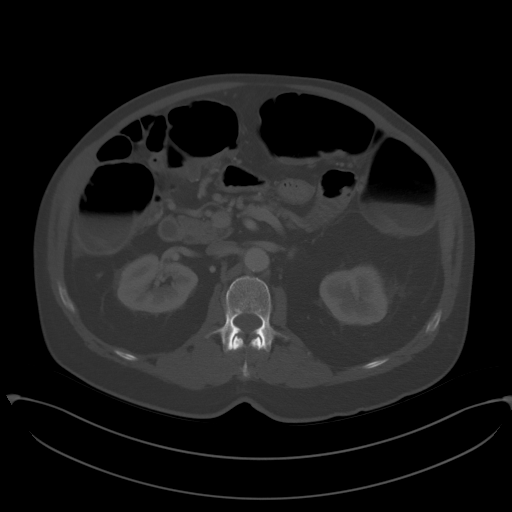
[im 71/92  soft-tissue]
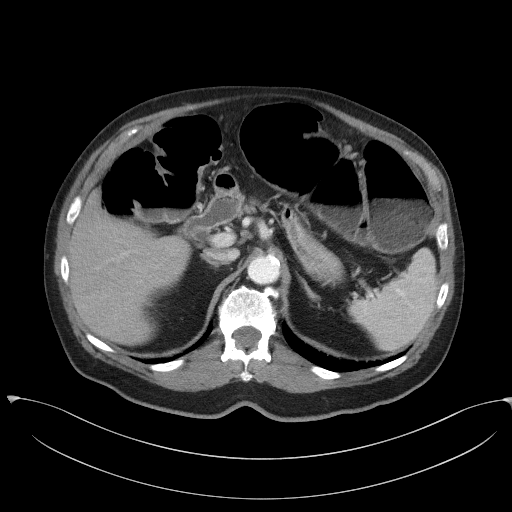
[im 78/92  soft-tissue]
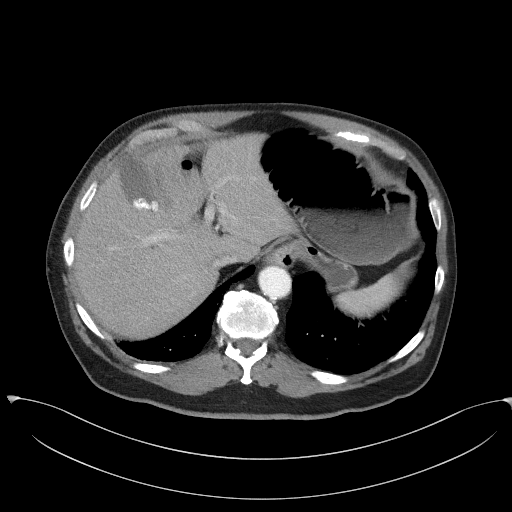
[im 85/92  soft-tissue]
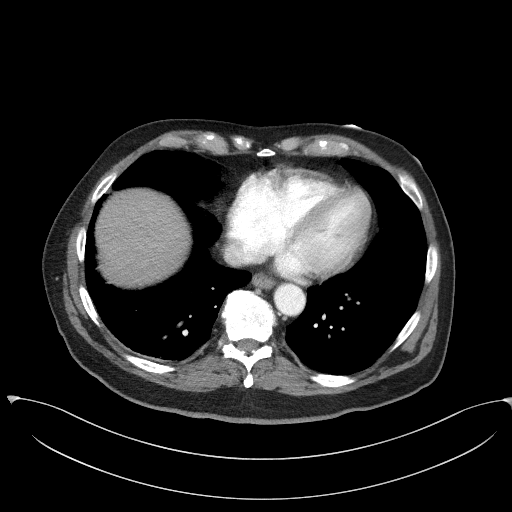

[Series 5: coronal st · coronal · 0.83mm/px · 3 of 105 slices shown]
[im 35/105  soft-tissue]
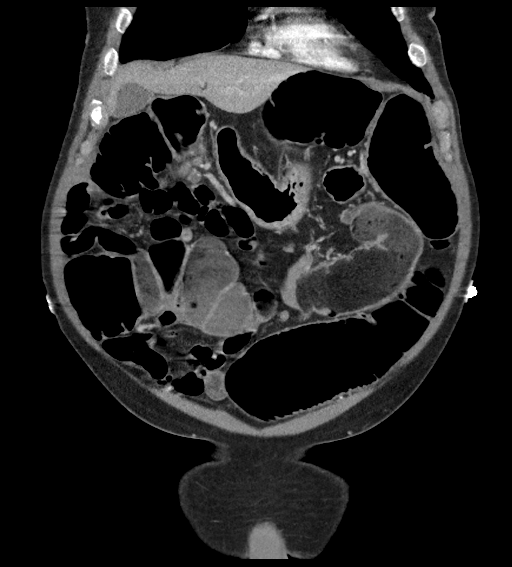
[im 47/105  soft-tissue]
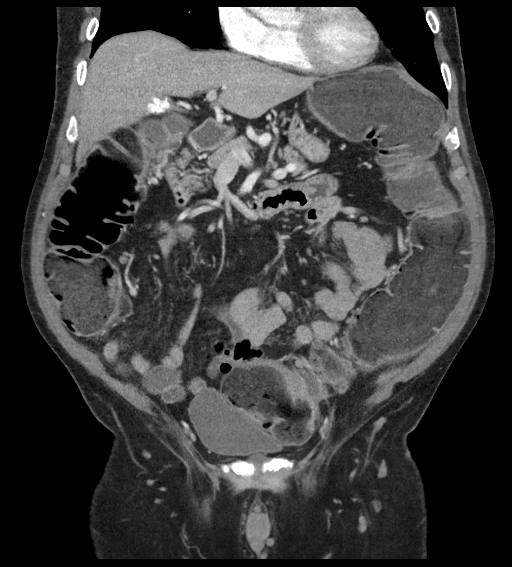
[im 58/105  soft-tissue]
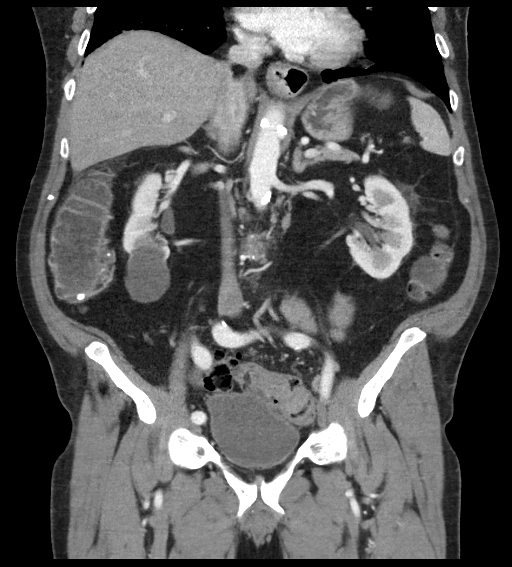

[15 of 46 positions shown; findings below may reference images not displayed]

FINDINGS: Lower chest: Normal heart size. Right main coronary calcification.
Small paraesophageal hernia. Calcified atherosclerosis of the
thoracic aorta. Mild subpleural atelectasis. No dependent pleural
effusion.

Hepatobiliary: Several small gallstones within the dependent
gallbladder lumen. No gallbladder wall thickening or dilatation. No
calcified stone within the cystic or common bile duct. Mildly
dilated 7 mm caliber of the proximal common bile duct with normal
distal tapering. Fatty infiltration of the liver. Normal hepatic
size and contours without ascites. Patent portal veins.

Pancreas: Mild atrophy. No acute pancreatitis.

Spleen: Normal.

Adrenals/Urinary Tract: Normal right and left adrenal glands.
Symmetrical moderate bilateral perinephric inflammatory change,
likely medical renal disease. Normal bilateral renal cortical
thickness. A 4.5 cm 8 Hounsfield unit simple appearing right renal
lower pole exophytic cyst and smaller simple appearing bilateral
cortical cysts. Nonobstructing right micro nephrolithiasis. No
hydronephrosis or ureteral calculus bilaterally.

Stomach/Bowel: Normal appendix, coronal image 39. Moderate gaseous
distension throughout the colon with air-fluid levels to the level
of the mid sigmoid colon where stool or soft tissue mucosal
thickening is present in the bowel lumen. Decompressed distal
sigmoid colon. Small amount of steatotic stool in the rectum. Mild
diverticulosis of the distal sigmoid and descending colon.
Decompressed small bowel and gastric lumen without an apparent
mucosal abnormality. Congenital nonfusion of the cecum to the
retroperitoneal reflection without volvulus.

Vascular/Lymphatic: Short-segment 13 mm fusiform dilatation of the
proximal celiac artery. Aortic calcified atherosclerosis without an
abdominal aortic aneurysmal dilatation.

Reproductive: Mild prostatomegaly.

Other: Small bilateral inguinal herniations of fat without
strangulation or incarceration.

Musculoskeletal: Diffuse mild bone demineralization. Moderate
skeletal degenerative change. Grade 1 anterolisthesis of L4 on L5
and grade 1 retrolisthesis of L5 on S1 and T12 on L1, likely
degenerative. Pseudoarticulation of the L4 and L5 spinous processes.
Severe osseous narrowing of the left L5-S1 neural foramen. Probable
benign bone islands in the bilateral femur.

These results will be called to the ordering clinician or
representative by the Radiologist Assistant, and communication
documented in the PACS or [REDACTED].
IMPRESSION: Partial distal colonic obstruction with moderate gaseous distension
of the colon with air-fluid levels to the level of the mid sigmoid
colon where there is either stool, colo-colonic intussusception,
and/or abnormal and possibly malignant soft tissue mucosal mass.
Correlation with colonoscopy recommended. No acute diverticulitis.

Uncomplicated cholelithiasis.

Short-segment 13 mm fusiform dilatation of the proximal celiac
artery, similar to [6N].

Nonobstructing right-sided micronephrolithiasis.

Small paraesophageal hernia.

Fatty infiltration of the liver.

Mild prostatomegaly.

Severe osseous narrowing of the left L5-S1 neural foramen,
potentially impinging on the exiting left L5 nerve root.

Aortic Atherosclerosis ([6N]-[6N]).

## 2019-10-28 MED ORDER — ONDANSETRON HCL 4 MG PO TABS: 4.0000 mg | ORAL_TABLET | Freq: Four times a day (QID) | ORAL | Status: DC | PRN

## 2019-10-28 MED ORDER — ONDANSETRON HCL 4 MG/2ML IJ SOLN
4.0000 mg | Freq: Four times a day (QID) | INTRAMUSCULAR | Status: DC | PRN
  Administered 2019-10-29 (×2): 4 mg via INTRAVENOUS
  Filled 2019-10-28 (×3): qty 2

## 2019-10-28 MED ORDER — KCL IN DEXTROSE-NACL 40-5-0.9 MEQ/L-%-% IV SOLN
INTRAVENOUS | Status: DC
  Filled 2019-10-28 (×3): qty 1000

## 2019-10-28 MED ORDER — MORPHINE SULFATE (PF) 2 MG/ML IV SOLN
2.0000 mg | INTRAVENOUS | Status: DC | PRN
  Administered 2019-10-28 – 2019-10-29 (×4): 2 mg via INTRAVENOUS
  Filled 2019-10-28 (×5): qty 1

## 2019-10-28 MED ORDER — BISACODYL 5 MG PO TBEC
10.0000 mg | DELAYED_RELEASE_TABLET | Freq: Once | ORAL | Status: AC
  Administered 2019-10-28: 10 mg via ORAL
  Filled 2019-10-28: qty 2

## 2019-10-28 MED ORDER — SODIUM CHLORIDE (PF) 0.9 % IJ SOLN
INTRAMUSCULAR | Status: AC
  Filled 2019-10-28: qty 50

## 2019-10-28 MED ORDER — IOHEXOL 300 MG/ML  SOLN
100.0000 mL | Freq: Once | INTRAMUSCULAR | Status: AC | PRN
  Administered 2019-10-28: 100 mL via INTRAVENOUS

## 2019-10-28 MED ORDER — HEPARIN SODIUM (PORCINE) 5000 UNIT/ML IJ SOLN
5000.0000 [IU] | Freq: Three times a day (TID) | INTRAMUSCULAR | Status: AC
  Administered 2019-10-28 – 2019-10-30 (×5): 5000 [IU] via SUBCUTANEOUS
  Filled 2019-10-28 (×6): qty 1

## 2019-10-28 MED ORDER — IOHEXOL 350 MG/ML SOLN: 100.0000 mL | Freq: Once | INTRAVENOUS | Status: DC | PRN

## 2019-10-28 MED ORDER — MAGNESIUM CITRATE PO SOLN
1.0000 | Freq: Once | ORAL | Status: AC
  Administered 2019-10-28: 1 via ORAL
  Filled 2019-10-28: qty 296

## 2019-10-28 MED ORDER — ONDANSETRON HCL 4 MG/2ML IJ SOLN
4.0000 mg | Freq: Once | INTRAMUSCULAR | Status: AC
  Administered 2019-10-28: 16:00:00 4 mg via INTRAVENOUS
  Filled 2019-10-28: qty 2

## 2019-10-28 MED ORDER — PEG 3350-KCL-NA BICARB-NACL 420 G PO SOLR
4000.0000 mL | Freq: Once | ORAL | Status: AC
  Administered 2019-10-28: 4000 mL via ORAL
  Filled 2019-10-28: qty 4000

## 2019-10-28 MED ORDER — SODIUM CHLORIDE 0.9 % IV SOLN: INTRAVENOUS | Status: DC

## 2019-10-28 NOTE — ED Triage Notes (Signed)
Pt BIB EMS from home, loves with wife. Pt A&Ox4 and ambulatory. Pt c/o abdominal pain x3 days, with N/V. Pt c/o intermittent dizziness post COVID vaccine x1 month ago.   20G L hand

## 2019-10-28 NOTE — ED Notes (Signed)
ED TO INPATIENT HANDOFF REPORT  Name/Age/Gender Wesley [name] 79 y.o. male  Code Status    Code Status Orders  (From admission, onward)         Start     Ordered   10/28/19 1948  Full code  Continuous     10/28/19 1947        Code Status History    Date Active Date Inactive Code Status Order ID Comments User Context   08/12/2013 2222 08/13/2013 2044 Full Code [id number]  Orvan Falconer, MD Inpatient   Advance Care Planning Activity      Home/SNF/Other Home  Chief Complaint Colonic mass [K63.89]  Level of Care/Admitting Diagnosis ED Disposition    ED Disposition Condition [place]: [facility] [[id number]]  Level of Care: Med-Surg [16]  May admit patient to [facility] or [facility] if equivalent level of care is available:: Yes  Covid Evaluation: Asymptomatic Screening Protocol (No Symptoms)  Diagnosis: Colonic mass [id number]  Admitting Physician: Elwyn Reach [[id number]]  Attending Physician: Elwyn Reach [[id number]]  Estimated length of stay: past midnight tomorrow  Certification:: I certify this patient will need inpatient services for at least 2 midnights       Medical History Past Medical History:  Diagnosis Date  . 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 Chronic allergic rhinitis   . Coronary atherosclerosis of native coronary artery   . GERD (gastroesophageal reflux disease)   . High blood pressure   . Hypercholesterolemia   . Macrocytosis without anemia   . OSA on CPAP   . 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 Sleep apnea   . Umbilical hernia without obstruction or gangrene     Allergies Allergies  Allergen Reactions  . Demerol Other (See Comments)    Causes dizziness  . Promethazine Hcl Other (See Comments)    Dizziness     IV Location/Drains/Wounds Patient Lines/Drains/Airways Status   Active Line/Drains/Airways    Name:   Placement date:    Placement time:   Site:   Days:   Peripheral IV 10/28/19 Left Hand   10/28/19    1553    Hand   less than 1   Incision (Closed) 09/08/16 Abdomen Other (Comment)   09/08/16    1150     1145          Labs/Imaging Results for orders placed or performed during the hospital encounter of 10/28/19 (from the past 48 hour(s))  CBC with Differential     Status: None   Collection Time: 10/28/19  3:52 PM  Result Value Ref Range   WBC 8.6 4.0 - 10.5 K/uL   RBC 4.34 4.22 - 5.81 MIL/uL   Hemoglobin 14.4 13.0 - 17.0 g/dL   HCT 42.9 39.0 - 52.0 %   MCV 98.8 80.0 - 100.0 fL   MCH 33.2 26.0 - 34.0 pg   MCHC 33.6 30.0 - 36.0 g/dL   RDW 13.2 11.5 - 15.5 %   Platelets 200 150 - 400 K/uL   nRBC 0.0 0.0 - 0.2 %   Neutrophils Relative % 79 %   Neutro Abs 6.8 1.7 - 7.7 K/uL   Lymphocytes Relative 9 %   Lymphs Abs 0.8 0.7 - 4.0 K/uL   Monocytes Relative 12 %   Monocytes Absolute 1.0 0.1 - 1.0 K/uL   Eosinophils Relative 0 %   Eosinophils Absolute 0.0 0.0 -  0.5 K/uL   Basophils Relative 0 %   Basophils Absolute 0.0 0.0 - 0.1 K/uL   Immature Granulocytes 0 %   Abs Immature Granulocytes 0.03 0.00 - 0.07 K/uL    Comment: Performed at [facility], [place] [address], [place], [place] [address]  Comprehensive metabolic panel     Status: Abnormal   Collection Time: 10/28/19  3:52 PM  Result Value Ref Range   Sodium 139 135 - 145 mmol/L   Potassium 3.0 (L) 3.5 - 5.1 mmol/L   Chloride 107 98 - 111 mmol/L   CO2 21 (L) 22 - 32 mmol/L   Glucose, Bld 110 (H) 70 - 99 mg/dL    Comment: Glucose reference range applies only to samples taken after fasting for at least 8 hours.   BUN 14 8 - 23 mg/dL   Creatinine, Ser 0.57 (L) 0.61 - 1.24 mg/dL   Calcium 7.7 (L) 8.9 - 10.3 mg/dL   Total Protein 6.2 (L) 6.5 - 8.1 g/dL   Albumin 3.4 (L) 3.5 - 5.0 g/dL   AST 18 15 - 41 U/L   ALT 15 0 - 44 U/L   Alkaline Phosphatase 72 38 - 126 U/L   Total Bilirubin 0.9 0.3 - 1.2 mg/dL   GFR calc non Af Amer >60  >60 mL/min   GFR calc Af Amer >60 >60 mL/min   Anion gap 11 5 - 15    Comment: Performed at [facility], [place] [address], [place], [place] [address]  Lipase, blood     Status: None   Collection Time: 10/28/19  3:52 PM  Result Value Ref Range   Lipase 20 11 - 51 U/L    Comment: Performed at [facility], [place] [address], [place], [place] [address]  Urinalysis, Routine w reflex microscopic     Status: Abnormal   Collection Time: 10/28/19  6:00 PM  Result Value Ref Range   Color, Urine YELLOW YELLOW   APPearance CLEAR CLEAR   Specific Gravity, Urine 1.034 (H) 1.005 - 1.030   pH 5.0 5.0 - 8.0   Glucose, UA NEGATIVE NEGATIVE mg/dL   Hgb urine dipstick SMALL (A) NEGATIVE   Bilirubin Urine NEGATIVE NEGATIVE   Ketones, ur 20 (A) NEGATIVE mg/dL   Protein, ur NEGATIVE NEGATIVE mg/dL   Nitrite NEGATIVE NEGATIVE   Leukocytes,Ua NEGATIVE NEGATIVE   RBC / HPF 0-5 0 - 5 RBC/hpf   WBC, UA 0-5 0 - 5 WBC/hpf   Bacteria, UA NONE SEEN NONE SEEN   Mucus PRESENT     Comment: Performed at [facility], [place], [place] [address], [place], [place] [address]   CT Abdomen Pelvis W Contrast  Result Date: 10/28/2019 CLINICAL DATA:  Suspect diverticulitis. Acute abdominal pain the past 3 days. EXAM: CT ABDOMEN AND PELVIS WITH CONTRAST TECHNIQUE: Multidetector CT imaging of the abdomen and pelvis was performed using the standard protocol following bolus administration of intravenous contrast. Automatic exposure control utilized. CONTRAST:  OMNIPAQUE IOHEXOL 350 MG/ML SOLN, 145mL OMNIPAQUE IOHEXOL 300 MG/ML SOLN COMPARISON:  August 12, 2013 FINDINGS: Lower chest: Normal heart size. Right main coronary calcification. Small paraesophageal hernia. Calcified atherosclerosis of the thoracic aorta. Mild subpleural atelectasis. No dependent pleural effusion. Hepatobiliary: Several small gallstones within the dependent gallbladder lumen. No gallbladder wall thickening or  dilatation. No calcified stone within the cystic or common bile duct. Mildly dilated 7 mm caliber of the proximal common bile duct with normal distal tapering. Fatty infiltration of the  liver. Normal hepatic size and contours without ascites. Patent portal veins. Pancreas: Mild atrophy. No acute pancreatitis. Spleen: Normal. Adrenals/Urinary Tract: Normal right and left adrenal glands. Symmetrical moderate bilateral perinephric inflammatory change, likely medical renal disease. Normal bilateral renal cortical thickness. A 4.5 cm 8 Hounsfield unit simple appearing right renal lower pole exophytic cyst and smaller simple appearing bilateral cortical cysts. Nonobstructing right micro nephrolithiasis. No hydronephrosis or ureteral calculus bilaterally. Stomach/Bowel: Normal appendix, coronal image 39. Moderate gaseous distension throughout the colon with air-fluid levels to the level of the mid sigmoid colon where stool or soft tissue mucosal thickening is present in the bowel lumen. Decompressed distal sigmoid colon. Small amount of steatotic stool in the rectum. Mild diverticulosis of the distal sigmoid and descending colon. Decompressed small bowel and gastric lumen without an apparent mucosal abnormality. 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 Aortic calcified atherosclerosis without an abdominal aortic aneurysmal dilatation. Reproductive: Mild prostatomegaly. Other: Small bilateral inguinal herniations of fat without strangulation or incarceration. Musculoskeletal: Diffuse mild bone demineralization. Moderate skeletal degenerative change. Grade 1 anterolisthesis of L4 on L5 and grade 1 retrolisthesis of L5 on S1 and T12 on L1, likely degenerative. Pseudoarticulation of the L4 and L5 spinous processes. Severe osseous narrowing of the left L5-S1 neural foramen. Probable benign bone  islands in the bilateral femur. These results will be called to the ordering clinician or representative by the Radiologist Assistant, and communication documented in the PACS or [employer]. IMPRESSION: Partial distal colonic obstruction with moderate gaseous distension of the colon with air-fluid levels to the level of the mid sigmoid colon where there is either stool, colo-colonic intussusception, and/or abnormal and possibly malignant soft tissue mucosal mass. Correlation with colonoscopy recommended. No acute diverticulitis. Uncomplicated cholelithiasis. 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 Small paraesophageal hernia. Fatty infiltration of the liver. Mild prostatomegaly. Severe osseous narrowing of the left L5-S1 neural foramen, potentially impinging on the exiting left L5 nerve root. Aortic Atherosclerosis (ICD10-I70.0). Electronically Signed   By: Revonda Humphrey   On: 10/28/2019 17:27    Pending Labs Unresulted Labs (From admission, onward)    Start     Ordered   10/29/19 0500  Comprehensive metabolic panel  Tomorrow morning,   R     10/28/19 1947   10/29/19 0500  CBC  Tomorrow morning,   R     10/28/19 1947   10/28/19 1948  CBC  (heparin)  Once,   STAT    Comments: Baseline for heparin therapy IF NOT ALREADY DRAWN.  Notify MD if PLT < 100 K.    10/28/19 1947   10/28/19 1948  Creatinine, serum  (heparin)  Once,   STAT    Comments: Baseline for heparin therapy IF NOT ALREADY DRAWN.    10/28/19 1947   10/28/19 1917  Respiratory Panel by RT PCR (Flu A&B, Covid) - Nasopharyngeal Swab  (Tier 2 Respiratory Panel by RT PCR (Flu A&B, Covid) (TAT 2 hrs))  Once,   STAT    Question Answer Comment  Is this test for diagnosis or screening Screening   Symptomatic for COVID-19 as defined by CDC No   Hospitalized for COVID-19 No   Admitted to ICU for COVID-19 No   Previously tested for COVID-19 No    Resident in a congregate (group) care setting No   Employed in healthcare setting No  10/28/19 1916          Vitals/Pain Today's Vitals   10/28/19 1539 10/28/19 1600 10/28/19 1800  BP: 138/78 (!) 143/95 (!) 140/97  Pulse: 80 90 94  Resp: 18 18 18   Temp: 98 F (36.7 C)    SpO2: 100% 95% 94%    Isolation Precautions No active isolations  Medications Medications  iohexol (OMNIPAQUE) 350 MG/ML injection 100 mL ( Intravenous Canceled Entry 10/28/19 1645)  sodium chloride (PF) 0.9 % injection (has no administration in time range)  heparin injection 5,000 Units (has no administration in time range)  dextrose 5 % and 0.9 % NaCl with KCl 40 mEq/L infusion (has no administration in time range)  ondansetron (ZOFRAN) tablet 4 mg (has no administration in time range)    Or  ondansetron (ZOFRAN) injection 4 mg (has no administration in time range)  morphine 2 MG/ML injection 2 mg (has no administration in time range)  0.9 %  sodium chloride infusion (has no administration in time range)  polyethylene glycol-electrolytes (NuLYTELY) solution 4,000 mL (has no administration in time range)  magnesium citrate solution 1 Bottle (has no administration in time range)  bisacodyl (DULCOLAX) EC tablet 10 mg (has no administration in time range)  ondansetron (ZOFRAN) injection 4 mg (4 mg Intravenous Given 10/28/19 1552)  iohexol (OMNIPAQUE) 300 MG/ML solution 100 mL (100 mLs Intravenous Contrast Given 10/28/19 1645)    Mobility walks

## 2019-10-28 NOTE — ED Notes (Signed)
Transport called to take patient upstairs 

## 2019-10-28 NOTE — H&P (Signed)
History and Physical   Wesley [name] [id number] DOB: [birth year] DOA: 10/28/2019  Referring MD/NP/PA: Dr. Gilford Raid  PCP: Deland Pretty, MD   Patient coming from: Home  Chief Complaint: Abdominal pain for 3 days with nausea and vomiting  HPI: Wesley [name] is a 79 y.o. male with medical history significant of GERD, coronary artery disease, hyperlipidemia, hypertension, obstructive sleep apnea, BPH, who has apparently been having recurrent abdominal pain with nausea for the last 3 months.  Patient has dealt with these on and off without a problem until the last 3 days when it became worse associated with nausea and vomiting and abdominal distention.  Patient has felt pain at the rate of 6 out of 10.  He was worried and called his primary care physician who referred him to the ER for evaluation.  In the ER CT of the abdomen showed distals colonic obstruction partially with suspected colon mass.  He was supposed to be seen by gastroenterology in 2 days time.  Patient now will be admitted for emergent colonoscopy to evaluate for possible obstructing mass and possible biopsy.  He has lost some weight but not significantly in the last 3 months.  Denied any melena or bright red blood per rectum.  ED Course: Temperature 98 blood pressure 152/101 pulse 109 respirate of 18 oxygen sats 94% room air.  Chemistry largely within normal except for potassium of 3.0 and CBC is within normal.  Glucose 110.  COVID-19 screen is negative urinalysis essentially negative.  CT abdomen pelvis shows partial distal colonic obstruction with moderate gaseous distention of the colon with air-fluid levels out to the mid sigmoid colon.  At that point there is suspicion for a mass or intussusception.  Patient being in admitted for work-up including colonoscopy.  Review of Systems: As per HPI otherwise 10 point review of systems negative.    Past Medical History:  Diagnosis Date  . Benign localized prostatic hyperplasia  with lower urinary tract symptoms (LUTS)   . Chronic allergic rhinitis   . Coronary atherosclerosis of native coronary artery   . GERD (gastroesophageal reflux disease)   . High blood pressure   . Hypercholesterolemia   . Macrocytosis without anemia   . OSA on CPAP   . 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 Sleep apnea   . Umbilical hernia without obstruction or gangrene     Past Surgical History:  Procedure Laterality Date  . CARDIAC CATHETERIZATION  2000   Cath to rule out cardiac problems RT HTN. PT denies significant findings  . CARDIAC CATHETERIZATION  2008  . EYE SURGERY     BILATERAL CATARACT SURGERY WITH LENS IMPLANTS  . INSERTION OF MESH N/A 09/08/2016   Procedure: INSERTION OF MESH;  Surgeon: Jackolyn Confer, MD;  Location: [facility];  Service: General;  Laterality: N/A;  . right rotator cuff    . ROTATOR CUFF REPAIR    . TONSILLECTOMY    . UMBILICAL HERNIA REPAIR N/A 09/08/2016   Procedure: OPEN UMBILICAL HERNIA REPAIR WITH MESH;  Surgeon: Jackolyn Confer, MD;  Location: [facility];  Service: General;  Laterality: N/A;     reports that he has never smoked. He has never used smokeless tobacco. He reports current alcohol use of about 14.0 standard drinks of alcohol per week. He reports that he does not use drugs.  Allergies  Allergen Reactions  . Demerol Other (See Comments)    Causes dizziness  . Promethazine Hcl Other (See Comments)  Dizziness     Family History  Problem Relation Age of Onset  . Diabetes Mother   . CAD Father   . Melanoma Brother 16  . Healthy Daughter   . Healthy Son      Prior to Admission medications   Medication Sig Start Date End Date Taking? Authorizing Provider  atorvastatin (LIPITOR) 80 MG tablet TAKE 1 TABLET EVERY DAY  AT  6  PM Patient taking differently: Take 80 mg by mouth daily at 6 PM.  06/06/19  Yes Skains, Thana Farr, MD  cetirizine (ZYRTEC) 10 MG tablet Take 10 mg by mouth daily. ALLERTEC  AT 2 AM DAILY   Yes [provider]  Chromium Picolinate 800 MCG TABS Take 1 tablet by mouth daily.   Yes [provider]  finasteride (PROSCAR) 5 MG tablet Take 5 mg by mouth every morning. AT 2 AM DAILY   Yes [provider]  fluticasone (FLONASE) 50 MCG/ACT nasal spray Place 2 sprays into both nostrils daily as needed for allergies or rhinitis.   Yes [provider]  ibuprofen (ADVIL,MOTRIN) 200 MG tablet Take 200 mg by mouth every 6 (six) hours as needed.   Yes [provider]  lisinopril (PRINIVIL,ZESTRIL) 10 MG tablet Take 10 mg by mouth every morning. AT 2 AM DAILY   Yes [provider]  Magnesium 400 MG TABS Take 1 tablet by mouth daily as needed.   Yes [provider]  Omega-3 Fatty Acids (FISH OIL) 1200 MG CAPS Take 1,200 mg by mouth daily. IN AFTERNOON AFTER LUNCH   Yes [provider]  vitamin B-12 (CYANOCOBALAMIN) 1000 MCG tablet Take 1,000 mcg by mouth daily. AT 2 AM DAILY   Yes [provider]  aspirin EC 81 MG tablet Take 1 tablet (81 mg total) by mouth every other day. Patient not taking: Reported on 10/28/2019 01/13/19   Jerline Pain, MD    Physical Exam: Vitals:   10/28/19 1539 10/28/19 1600 10/28/19 1800  BP: 138/78 (!) 143/95 (!) 140/97  Pulse: 80 90 94  Resp: 18 18 18   Temp: 98 F (36.7 C)    SpO2: 100% 95% 94%      Constitutional: Morbidly obese, in mild distress Vitals:   10/28/19 1539 10/28/19 1600 10/28/19 1800  BP: 138/78 (!) 143/95 (!) 140/97  Pulse: 80 90 94  Resp: 18 18 18   Temp: 98 F (36.7 C)    SpO2: 100% 95% 94%   Eyes: PERRL, lids and conjunctivae normal ENMT: Mucous membranes are dry. Posterior pharynx clear of any exudate or lesions.Normal dentition.  Neck: normal, supple, no masses, no thyromegaly Respiratory: clear to auscultation bilaterally, no wheezing, no crackles. Normal respiratory effort. No accessory muscle use.  Cardiovascular: Regular rate and rhythm, no murmurs / rubs / gallops.  No extremity edema. 2+ pedal pulses. No carotid bruits.  Abdomen: Distended, firm, mildly diffusely tender, no palpable organomegaly bowel sounds positive.  Musculoskeletal: no clubbing / cyanosis. No joint deformity upper and lower extremities. Good ROM, no contractures. Normal muscle tone.  Skin: no rashes, lesions, ulcers. No induration Neurologic: CN 2-12 grossly intact. Sensation intact, DTR normal. Strength 5/5 in all 4.  Psychiatric: Normal judgment and insight. Alert and oriented x 3. Normal mood.     Labs on Admission: I have personally reviewed following labs and imaging studies  CBC: Recent Labs  Lab 10/28/19 1552  WBC 8.6  NEUTROABS 6.8  HGB 14.4  HCT 42.9  MCV 98.8  PLT  [id number]   Basic Metabolic Panel: Recent Labs  Lab 10/28/19 1552  NA 139  K 3.0*  CL 107  CO2 21*  GLUCOSE 110*  BUN 14  CREATININE 0.57*  CALCIUM 7.7*   GFR: CrCl cannot be calculated (Unknown ideal weight.). Liver Function Tests: Recent Labs  Lab 10/28/19 1552  AST 18  ALT 15  ALKPHOS 72  BILITOT 0.9  PROT 6.2*  ALBUMIN 3.4*   Recent Labs  Lab 10/28/19 1552  LIPASE 20   No results for input(s): AMMONIA in the last 168 hours. Coagulation Profile: No results for input(s): INR, PROTIME in the last 168 hours. Cardiac Enzymes: No results for input(s): CKTOTAL, CKMB, CKMBINDEX, TROPONINI in the last 168 hours. BNP (last 3 results) No results for input(s): PROBNP in the last 8760 hours. HbA1C: No results for input(s): HGBA1C in the last 72 hours. CBG: No results for input(s): GLUCAP in the last 168 hours. Lipid Profile: No results for input(s): CHOL, HDL, LDLCALC, TRIG, CHOLHDL, LDLDIRECT in the last 72 hours. Thyroid Function Tests: No results for input(s): TSH, T4TOTAL, FREET4, T3FREE, THYROIDAB in the last 72 hours. Anemia Panel: No results for input(s): VITAMINB12, FOLATE, FERRITIN, TIBC, IRON, RETICCTPCT in the last 72 hours. Urine analysis:    Component Value Date/Time     COLORURINE YELLOW 10/28/2019 1800   APPEARANCEUR CLEAR 10/28/2019 1800   LABSPEC 1.034 (H) 10/28/2019 1800   PHURINE 5.0 10/28/2019 1800   GLUCOSEU NEGATIVE 10/28/2019 1800   HGBUR SMALL (A) 10/28/2019 1800   BILIRUBINUR NEGATIVE 10/28/2019 1800   KETONESUR 20 (A) 10/28/2019 1800   PROTEINUR NEGATIVE 10/28/2019 1800   NITRITE NEGATIVE 10/28/2019 1800   LEUKOCYTESUR NEGATIVE 10/28/2019 1800   Sepsis Labs: @LABRCNTIP (procalcitonin:4,lacticidven:4) )No results found for this or any previous visit (from the past 240 hour(s)).   Radiological Exams on Admission: CT Abdomen Pelvis W Contrast  Result Date: 10/28/2019 CLINICAL DATA:  Suspect diverticulitis. Acute abdominal pain the past 3 days. EXAM: CT ABDOMEN AND PELVIS WITH CONTRAST TECHNIQUE: Multidetector CT imaging of the abdomen and pelvis was performed using the standard protocol following bolus administration of intravenous contrast. Automatic exposure control utilized. CONTRAST:  OMNIPAQUE IOHEXOL 350 MG/ML SOLN, 148mL OMNIPAQUE IOHEXOL 300 MG/ML SOLN COMPARISON:  August 12, 2013 FINDINGS: Lower chest: Normal heart size. Right main coronary calcification. Small paraesophageal hernia. Calcified atherosclerosis of the thoracic aorta. Mild subpleural atelectasis. No dependent pleural effusion. Hepatobiliary: Several small gallstones within the dependent gallbladder lumen. No gallbladder wall thickening or dilatation. No calcified stone within the cystic or common bile duct. Mildly dilated 7 mm caliber of the proximal common bile duct with normal distal tapering. Fatty infiltration of the liver. Normal hepatic size and contours without ascites. Patent portal veins. Pancreas: Mild atrophy. No acute pancreatitis. Spleen: Normal. Adrenals/Urinary Tract: Normal right and left adrenal glands. Symmetrical moderate bilateral perinephric inflammatory change, likely medical renal disease. Normal bilateral renal cortical thickness. A 4.5 cm 8 Hounsfield  unit simple appearing right renal lower pole exophytic cyst and smaller simple appearing bilateral cortical cysts. Nonobstructing right micro nephrolithiasis. No hydronephrosis or ureteral calculus bilaterally. Stomach/Bowel: Normal appendix, coronal image 39. Moderate gaseous distension throughout the colon with air-fluid levels to the level of the mid sigmoid colon where stool or soft tissue mucosal thickening is present in the bowel lumen. Decompressed distal sigmoid colon. Small amount of steatotic stool in the rectum. Mild diverticulosis of the distal sigmoid and descending colon. Decompressed small bowel and gastric lumen without an apparent mucosal abnormality. 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 Aortic calcified atherosclerosis without an abdominal aortic aneurysmal dilatation. Reproductive: Mild prostatomegaly. Other: Small bilateral inguinal herniations of fat without strangulation or incarceration. Musculoskeletal: Diffuse mild bone demineralization. Moderate skeletal degenerative change. Grade 1 anterolisthesis of L4 on L5 and grade 1 retrolisthesis of L5 on S1 and T12 on L1, likely degenerative. Pseudoarticulation of the L4 and L5 spinous processes. Severe osseous narrowing of the left L5-S1 neural foramen. Probable benign bone islands in the bilateral femur. These results will be called to the ordering clinician or representative by the Radiologist Assistant, and communication documented in the PACS or [employer]. IMPRESSION: Partial distal colonic obstruction with moderate gaseous distension of the colon with air-fluid levels to the level of the mid sigmoid colon where there is either stool, colo-colonic intussusception, and/or abnormal and possibly malignant soft tissue mucosal mass. Correlation with colonoscopy recommended. No acute diverticulitis.  Uncomplicated cholelithiasis. 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 Small paraesophageal hernia. Fatty infiltration of the liver. Mild prostatomegaly. Severe osseous narrowing of the left L5-S1 neural foramen, potentially impinging on the exiting left L5 nerve root. Aortic Atherosclerosis (ICD10-I70.0). Electronically Signed   By: Revonda Humphrey   On: 10/28/2019 17:27      Assessment/Plan Principal Problem:   Colonic mass Active Problems:   High blood pressure   GERD (gastroesophageal reflux disease)   CAD (coronary artery disease)   Hypokalemia     #1 partial large colon obstruction: Secondary to suspected obstructing mass.  Patient to have colonoscopy tomorrow.  We will begin bowel prep.  GI consulted.  #2 suspected colonic mass: Patient's last colonoscopy was many years ago.  He is currently unassigned.  GI consulted for colonoscopy in the morning.  #3 hypertension: Treat as necessary with IV medications while patient is n.p.o  #4 coronary artery disease: Stable no evidence of exacerbation or decompensation.  #5 GERD: Continue PPIs.   DVT prophylaxis: Lovenox Code Status: Full code Family Communication: No family at bedside Disposition Plan: Home Consults called: Gastroenterology Dr. Modena Nunnery, [place] Admission status: Inpatient  Severity of Illness: The appropriate patient status for this patient is INPATIENT. Inpatient status is judged to be reasonable and necessary in order to provide the required intensity of service to ensure the patient's safety. The patient's presenting symptoms, physical exam findings, and initial radiographic and laboratory data in the context of their chronic comorbidities is felt to place them at high risk for further clinical deterioration. Furthermore, it is not anticipated that the patient will be medically stable for discharge from the hospital within 2  midnights of admission. The following factors support the patient status of inpatient.   " The patient's presenting symptoms include abdominal pain with nausea vomiting. " The worrisome physical exam findings include distended abdomen. " The initial radiographic and laboratory data are worrisome because of CT findings of possible colonic mass. " The chronic co-morbidities include hypertension hypothyroid.   * I certify that at the point of admission it is my clinical judgment that the patient will require inpatient hospital care spanning beyond 2 midnights from the point of admission due to high intensity of service, high risk for further deterioration and high frequency of surveillance required.Barbette Merino MD [facility] Pager [phone]  If 7PM-7AM, please contact night-coverage [email or web address] Password [facility]  10/28/2019, 7:28 PM

## 2019-10-28 NOTE — ED Provider Notes (Signed)
[facility] DEPT Provider Note   CSN: [id number] Arrival date & time: 10/28/19  1523     History Chief Complaint  Patient presents with  . Abdominal Pain  . Nausea  . Emesis    Wesley [name] is a 79 y.o. male.  Pt presents to the ED today with lower abdominal pain and n/v.  Pt said it's been going on for several days.  The pt said he did see his doctor yesterday who did some labs.  Pt was referred to [facility].  His appointment is on 3/18 with a PA.  The pt said he's worse today, so he called EMS.  He has not had any f/c.  He's gotten both doses of his Covid vaccine.        Past Medical History:  Diagnosis Date  . 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 Chronic allergic rhinitis   . Coronary atherosclerosis of native coronary artery   . GERD (gastroesophageal reflux disease)   . High blood pressure   . Hypercholesterolemia   . Macrocytosis without anemia   . OSA on CPAP   . 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 Sleep apnea   . Umbilical hernia without obstruction or gangrene     Patient Active Problem List   Diagnosis Date Noted  . 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 Non-traumatic rotator cuff tear 09/21/2017  . Syncope and collapse 08/12/2013  . Back pain 08/12/2013  . CAD (coronary artery disease) 08/12/2013  . High blood pressure 03/15/2011  . GERD (gastroesophageal reflux disease) 03/15/2011  . Paronychia of great toe of left foot 03/15/2011    Past Surgical History:  Procedure Laterality Date  . CARDIAC CATHETERIZATION  2000   Cath to rule out cardiac problems RT HTN. PT denies significant findings  . CARDIAC CATHETERIZATION  2008  . EYE SURGERY     BILATERAL CATARACT SURGERY WITH LENS IMPLANTS  . INSERTION OF MESH N/A 09/08/2016   Procedure: INSERTION OF MESH;  Surgeon: Jackolyn Confer, MD;  Location: [facility];  Service: General;  Laterality: N/A;  .  right rotator cuff    . ROTATOR CUFF REPAIR    . TONSILLECTOMY    . UMBILICAL HERNIA REPAIR N/A 09/08/2016   Procedure: OPEN UMBILICAL HERNIA REPAIR WITH MESH;  Surgeon: Jackolyn Confer, MD;  Location: [facility];  Service: General;  Laterality: N/A;       Family History  Problem Relation Age of Onset  . Diabetes Mother   . CAD Father   . Melanoma Brother 86  . Healthy Daughter   . Healthy Son     Social History   Tobacco Use  . Smoking status: Never Smoker  . Smokeless tobacco: Never Used  Substance Use Topics  . Alcohol use: Yes    Alcohol/week: 14.0 standard drinks    Types: 14 drink(s) per week    Comment: or more  . Drug use: No    Home Medications Prior to Admission medications   Medication Sig Start Date End Date Taking? Authorizing Provider  atorvastatin (LIPITOR) 80 MG tablet TAKE 1 TABLET EVERY DAY  AT  6  PM Patient taking differently: Take 80 mg by mouth daily at 6 PM.  06/06/19  Yes Skains, Thana Farr, MD  cetirizine (ZYRTEC) 10 MG tablet Take 10 mg by mouth daily. ALLERTEC  AT 2 AM DAILY   Yes [provider]  Chromium Picolinate  800 MCG TABS Take 1 tablet by mouth daily.   Yes [provider]  finasteride (PROSCAR) 5 MG tablet Take 5 mg by mouth every morning. AT 2 AM DAILY   Yes [provider]  fluticasone (FLONASE) 50 MCG/ACT nasal spray Place 2 sprays into both nostrils daily as needed for allergies or rhinitis.   Yes [provider]  ibuprofen (ADVIL,MOTRIN) 200 MG tablet Take 200 mg by mouth every 6 (six) hours as needed.   Yes [provider]  lisinopril (PRINIVIL,ZESTRIL) 10 MG tablet Take 10 mg by mouth every morning. AT 2 AM DAILY   Yes [provider]  Magnesium 400 MG TABS Take 1 tablet by mouth daily as needed.   Yes [provider]  Omega-3 Fatty Acids (FISH OIL) 1200 MG CAPS Take 1,200 mg by mouth daily. IN AFTERNOON AFTER LUNCH   Yes [provider]  vitamin B-12 (CYANOCOBALAMIN)  1000 MCG tablet Take 1,000 mcg by mouth daily. AT 2 AM DAILY   Yes [provider]  aspirin EC 81 MG tablet Take 1 tablet (81 mg total) by mouth every other day. Patient not taking: Reported on 10/28/2019 01/13/19   Jerline Pain, MD    Allergies    Demerol and Promethazine hcl  Review of Systems   Review of Systems  Gastrointestinal: Positive for abdominal pain, nausea and vomiting.  All other systems reviewed and are negative.   Physical Exam Updated Vital Signs BP (!) 140/97   Pulse 94   Temp 98 F (36.7 C)   Resp 18   SpO2 94%   Physical Exam Vitals and nursing note reviewed.  Constitutional:      Appearance: He is well-developed.  HENT:     Head: Normocephalic and atraumatic.     Mouth/Throat:     Mouth: Mucous membranes are moist.     Pharynx: Oropharynx is clear.  Eyes:     Extraocular Movements: Extraocular movements intact.     Pupils: Pupils are equal, round, and reactive to light.  Cardiovascular:     Rate and Rhythm: Normal rate and regular rhythm.  Pulmonary:     Effort: Pulmonary effort is normal.     Breath sounds: Normal breath sounds.  Abdominal:     General: Abdomen is flat.     Palpations: Abdomen is soft.     Tenderness: There is abdominal tenderness in the right lower quadrant and left lower quadrant.  Skin:    General: Skin is warm.     Capillary Refill: Capillary refill takes less than 2 seconds.  Neurological:     General: No focal deficit present.     Mental Status: He is alert and oriented to person, place, and time.  Psychiatric:        Mood and Affect: Mood normal.        Behavior: Behavior normal.     ED Results / Procedures / Treatments   Labs (all labs ordered are listed, but only abnormal results are displayed) Labs Reviewed  COMPREHENSIVE METABOLIC PANEL - Abnormal; Notable for the following components:      Result Value   Potassium 3.0 (*)    CO2 21 (*)    Glucose, Bld 110 (*)    Creatinine, Ser 0.57 (*)     Calcium 7.7 (*)    Total Protein 6.2 (*)    Albumin 3.4 (*)    All other components within normal limits  URINALYSIS, ROUTINE W REFLEX MICROSCOPIC - Abnormal; Notable  for the following components:   Specific Gravity, Urine 1.034 (*)    Hgb urine dipstick SMALL (*)    Ketones, ur 20 (*)    All other components within normal limits  RESPIRATORY PANEL BY RT PCR (FLU A&B, COVID)  CBC WITH DIFFERENTIAL/PLATELET  LIPASE, BLOOD    EKG None  Radiology CT Abdomen Pelvis W Contrast  Result Date: 10/28/2019 CLINICAL DATA:  Suspect diverticulitis. Acute abdominal pain the past 3 days. EXAM: CT ABDOMEN AND PELVIS WITH CONTRAST TECHNIQUE: Multidetector CT imaging of the abdomen and pelvis was performed using the standard protocol following bolus administration of intravenous contrast. Automatic exposure control utilized. CONTRAST:  OMNIPAQUE IOHEXOL 350 MG/ML SOLN, 175mL OMNIPAQUE IOHEXOL 300 MG/ML SOLN COMPARISON:  August 12, 2013 FINDINGS: Lower chest: Normal heart size. Right main coronary calcification. Small paraesophageal hernia. Calcified atherosclerosis of the thoracic aorta. Mild subpleural atelectasis. No dependent pleural effusion. Hepatobiliary: Several small gallstones within the dependent gallbladder lumen. No gallbladder wall thickening or dilatation. No calcified stone within the cystic or common bile duct. Mildly dilated 7 mm caliber of the proximal common bile duct with normal distal tapering. Fatty infiltration of the liver. Normal hepatic size and contours without ascites. Patent portal veins. Pancreas: Mild atrophy. No acute pancreatitis. Spleen: Normal. Adrenals/Urinary Tract: Normal right and left adrenal glands. Symmetrical moderate bilateral perinephric inflammatory change, likely medical renal disease. Normal bilateral renal cortical thickness. A 4.5 cm 8 Hounsfield unit simple appearing right renal lower pole exophytic cyst and smaller simple appearing bilateral cortical cysts.  Nonobstructing right micro nephrolithiasis. No hydronephrosis or ureteral calculus bilaterally. Stomach/Bowel: Normal appendix, coronal image 39. Moderate gaseous distension throughout the colon with air-fluid levels to the level of the mid sigmoid colon where stool or soft tissue mucosal thickening is present in the bowel lumen. Decompressed distal sigmoid colon. Small amount of steatotic stool in the rectum. Mild diverticulosis of the distal sigmoid and descending colon. Decompressed small bowel and gastric lumen without an apparent mucosal abnormality. 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 Aortic calcified atherosclerosis without an abdominal aortic aneurysmal dilatation. Reproductive: Mild prostatomegaly. Other: Small bilateral inguinal herniations of fat without strangulation or incarceration. Musculoskeletal: Diffuse mild bone demineralization. Moderate skeletal degenerative change. Grade 1 anterolisthesis of L4 on L5 and grade 1 retrolisthesis of L5 on S1 and T12 on L1, likely degenerative. Pseudoarticulation of the L4 and L5 spinous processes. Severe osseous narrowing of the left L5-S1 neural foramen. Probable benign bone islands in the bilateral femur. These results will be called to the ordering clinician or representative by the Radiologist Assistant, and communication documented in the PACS or [employer]. IMPRESSION: Partial distal colonic obstruction with moderate gaseous distension of the colon with air-fluid levels to the level of the mid sigmoid colon where there is either stool, colo-colonic intussusception, and/or abnormal and possibly malignant soft tissue mucosal mass. Correlation with colonoscopy recommended. No acute diverticulitis. Uncomplicated cholelithiasis. 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  Nonobstructing right-sided micronephrolithiasis. Small paraesophageal hernia. Fatty infiltration of the liver. Mild prostatomegaly. Severe osseous narrowing of the left L5-S1 neural foramen, potentially impinging on the exiting left L5 nerve root. Aortic Atherosclerosis (ICD10-I70.0). Electronically Signed   By: Revonda Humphrey   On: 10/28/2019 17:27    Procedures Procedures (including critical care time)  Medications Ordered in ED Medications  iohexol (OMNIPAQUE) 350 MG/ML injection 100 mL ( Intravenous Canceled  Entry 10/28/19 1645)  sodium chloride (PF) 0.9 % injection (has no administration in time range)  ondansetron (ZOFRAN) injection 4 mg (4 mg Intravenous Given 10/28/19 1552)  iohexol (OMNIPAQUE) 300 MG/ML solution 100 mL (100 mLs Intravenous Contrast Given 10/28/19 1645)    ED Course  I have reviewed the triage vital signs and the nursing notes.  Pertinent labs & imaging results that were available during my care of the patient were reviewed by me and considered in my medical decision making (see chart for details).    MDM Rules/Calculators/A&P                      Pt is feeling better after fluids and zofran.    Pt d/w [facility] (Dr. Tarri Glenn) who recommends admission.  She said to start bowel prep tonight.  Someone from her group will see him in the morning.  Pt d/w Dr. Jonelle Sidle ([facility]) who will admit.   Final Clinical Impression(s) / ED Diagnoses Final diagnoses:  Partial obstruction of colon (HCC)  Non-intractable vomiting with nausea, unspecified vomiting type    Rx / DC Orders ED Discharge Orders    None       Isla Pence, MD 10/28/19 1920

## 2019-10-29 ENCOUNTER — Encounter (HOSPITAL_COMMUNITY)
Admission: EM | Disposition: A | Discharge status: Home Health | Payer: Self-pay | Source: Home / Self Care | Attending: Family Medicine

## 2019-10-29 ENCOUNTER — Encounter (HOSPITAL_COMMUNITY): Payer: Self-pay | Admitting: Internal Medicine

## 2019-10-29 DIAGNOSIS — K6389 Other specified diseases of intestine: Secondary | ICD-10-CM

## 2019-10-29 DIAGNOSIS — R140 Abdominal distension (gaseous): Secondary | ICD-10-CM

## 2019-10-29 DIAGNOSIS — D490 Neoplasm of unspecified behavior of digestive system: Secondary | ICD-10-CM

## 2019-10-29 DIAGNOSIS — E876 Hypokalemia: Secondary | ICD-10-CM

## 2019-10-29 DIAGNOSIS — K219 Gastro-esophageal reflux disease without esophagitis: Secondary | ICD-10-CM

## 2019-10-29 DIAGNOSIS — I10 Essential (primary) hypertension: Secondary | ICD-10-CM

## 2019-10-29 DIAGNOSIS — R1084 Generalized abdominal pain: Secondary | ICD-10-CM

## 2019-10-29 DIAGNOSIS — I2510 Atherosclerotic heart disease of native coronary artery without angina pectoris: Secondary | ICD-10-CM

## 2019-10-29 DIAGNOSIS — K56600 Partial intestinal obstruction, unspecified as to cause: Secondary | ICD-10-CM

## 2019-10-29 DIAGNOSIS — D374 Neoplasm of uncertain behavior of colon: Secondary | ICD-10-CM

## 2019-10-29 HISTORY — PX: FLEXIBLE SIGMOIDOSCOPY: SHX5431

## 2019-10-29 HISTORY — PX: BIOPSY: SHX5522

## 2019-10-29 HISTORY — PX: SUBMUCOSAL TATTOO INJECTION: SHX6856

## 2019-10-29 LAB — COMPREHENSIVE METABOLIC PANEL
ALT: 15 U/L (ref 0–44)
AST: 19 U/L (ref 15–41)
Albumin: 3.9 g/dL (ref 3.5–5.0)
Alkaline Phosphatase: 80 U/L (ref 38–126)
Anion gap: 11 (ref 5–15)
BUN: 13 mg/dL (ref 8–23)
CO2: 26 mmol/L (ref 22–32)
Calcium: 9.3 mg/dL (ref 8.9–10.3)
Chloride: 99 mmol/L (ref 98–111)
Creatinine, Ser: 0.86 mg/dL (ref 0.61–1.24)
GFR calc Af Amer: >60 mL/min (ref >60)
GFR calc non Af Amer: >60 mL/min (ref >60)
Glucose, Bld: 143 mg/dL — ABNORMAL HIGH (ref 70–99)
Potassium: 4.6 mmol/L (ref 3.5–5.1)
Sodium: 136 mmol/L (ref 135–145)
Total Bilirubin: 0.6 mg/dL (ref 0.3–1.2)
Total Protein: 7.4 g/dL (ref 6.5–8.1)

## 2019-10-29 LAB — CBC
HCT: 50.8 % (ref 39.0–52.0)
Hemoglobin: 16.8 g/dL (ref 13.0–17.0)
MCH: 32.3 pg (ref 26.0–34.0)
MCHC: 33.1 g/dL (ref 30.0–36.0)
MCV: 97.7 fL (ref 80.0–100.0)
Platelets: 275 10*3/uL (ref 150–400)
RBC: 5.2 MIL/uL (ref 4.22–5.81)
RDW: 13.2 % (ref 11.5–15.5)
WBC: 9.9 10*3/uL (ref 4.0–10.5)
nRBC: 0 % (ref 0.0–0.2)

## 2019-10-29 LAB — TYPE AND SCREEN
ABO/RH(D): A NEG
Antibody Screen: NEGATIVE

## 2019-10-29 LAB — ABO/RH: ABO/RH(D): A NEG

## 2019-10-29 SURGERY — SIGMOIDOSCOPY, FLEXIBLE
Anesthesia: Moderate Sedation

## 2019-10-29 MED ORDER — VITAMIN B-12 1000 MCG PO TABS
1000.0000 ug | ORAL_TABLET | Freq: Every day | ORAL | Status: DC
  Administered 2019-10-29 – 2019-11-05 (×7): 1000 ug via ORAL
  Filled 2019-10-29 (×7): qty 1

## 2019-10-29 MED ORDER — FENTANYL CITRATE (PF) 100 MCG/2ML IJ SOLN
INTRAMUSCULAR | Status: DC | PRN
  Administered 2019-10-29 (×2): 25 ug via INTRAVENOUS

## 2019-10-29 MED ORDER — DEXTROSE-NACL 5-0.45 % IV SOLN: INTRAVENOUS | Status: DC

## 2019-10-29 MED ORDER — FENTANYL CITRATE (PF) 100 MCG/2ML IJ SOLN
INTRAMUSCULAR | Status: AC
  Filled 2019-10-29: qty 2

## 2019-10-29 MED ORDER — FLUTICASONE PROPIONATE 50 MCG/ACT NA SUSP: 2.0000 | Freq: Every day | NASAL | Status: DC | PRN

## 2019-10-29 MED ORDER — LACTATED RINGERS IV SOLN
INTRAVENOUS | Status: DC
  Administered 2019-10-29: 1000 mL via INTRAVENOUS

## 2019-10-29 MED ORDER — POLYETHYLENE GLYCOL 3350 17 GM/SCOOP PO POWD
1.0000 | Freq: Once | ORAL | Status: AC
  Administered 2019-10-29: 255 g via ORAL
  Filled 2019-10-29: qty 255

## 2019-10-29 MED ORDER — SPOT INK MARKER SYRINGE KIT
PACK | SUBMUCOSAL | Status: DC | PRN
  Administered 2019-10-29: 2 mL via SUBMUCOSAL

## 2019-10-29 MED ORDER — LISINOPRIL 10 MG PO TABS
10.0000 mg | ORAL_TABLET | Freq: Every day | ORAL | Status: DC
  Administered 2019-10-29 – 2019-11-05 (×7): 10 mg via ORAL
  Filled 2019-10-29 (×7): qty 1

## 2019-10-29 MED ORDER — FINASTERIDE 5 MG PO TABS: 5.0000 mg | ORAL_TABLET | Freq: Every day | ORAL | Status: DC | PRN

## 2019-10-29 MED ORDER — OXYCODONE HCL 5 MG PO TABS
5.0000 mg | ORAL_TABLET | ORAL | Status: DC | PRN
  Administered 2019-10-29 – 2019-11-04 (×14): 5 mg via ORAL
  Filled 2019-10-29 (×16): qty 1

## 2019-10-29 MED ORDER — MORPHINE SULFATE (PF) 2 MG/ML IV SOLN
2.0000 mg | INTRAVENOUS | Status: DC | PRN
  Administered 2019-10-29 – 2019-10-31 (×5): 2 mg via INTRAVENOUS
  Filled 2019-10-29 (×5): qty 1

## 2019-10-29 MED ORDER — MIDAZOLAM HCL (PF) 5 MG/ML IJ SOLN
INTRAMUSCULAR | Status: AC
  Filled 2019-10-29: qty 2

## 2019-10-29 MED ORDER — MIDAZOLAM HCL (PF) 10 MG/2ML IJ SOLN
INTRAMUSCULAR | Status: DC | PRN
  Administered 2019-10-29: 2 mg via INTRAVENOUS
  Administered 2019-10-29: 1 mg via INTRAVENOUS

## 2019-10-29 MED ORDER — LORATADINE 10 MG PO TABS
10.0000 mg | ORAL_TABLET | Freq: Every day | ORAL | Status: DC
  Administered 2019-10-29 – 2019-11-05 (×7): 10 mg via ORAL
  Filled 2019-10-29 (×7): qty 1

## 2019-10-29 MED ORDER — SODIUM CHLORIDE 0.9 % IV SOLN: INTRAVENOUS | Status: DC

## 2019-10-29 MED ORDER — SPOT INK MARKER SYRINGE KIT
PACK | SUBMUCOSAL | Status: AC
  Filled 2019-10-29: qty 5

## 2019-10-29 MED ORDER — ATORVASTATIN CALCIUM 40 MG PO TABS
80.0000 mg | ORAL_TABLET | Freq: Every day | ORAL | Status: DC
  Administered 2019-10-30 – 2019-11-05 (×6): 80 mg via ORAL
  Filled 2019-10-29 (×6): qty 2

## 2019-10-29 NOTE — Consult Note (Signed)
Consultation  Referring Milayna Rotenberg:    Dr. Bonner Puna Primary Care Physician:  Deland Pretty, MD Primary Gastroenterologist: Althia Forts      Reason for Consultation: Abdominal pain, colon mass            HPI:   Wesley [name] is a 79 y.o. male with a past medical history as listed below significant for reflux, CAD, hypertension, OSA, BPH and others who came to the ER 10/28/2019 with recurrent abdominal pain and nausea for the last 3 months.    Today, the patient explains that he was really feeling very well until January when he had a change towards constipation.  He saw his PCP who initially told him to start some MiraLAX and after 5 or so days this was helping and it seemed to mostly resolve other than some excess gas.  Then over the past month he started having increasing difficulty with gas and constipation.  This was accompanied with some lower abdominal cramping pain which seemed to come and go throughout the day.  He saw his PCP again at the beginning of this week who did some labs and an x-ray and told him that likely this was not dietary in nature and that he should see the GI physician.  Over the past 3 to 4 days symptoms increased and worsened to the point where the patient was nauseous and vomiting and could not get anything in because he "felt full up".  He noticed an increase in abdominal distention and felt he had to come to the ER so he called 911 yesterday.  He rates the pain as a 6/10 when it comes on.  Denies any previous colon cancer screening.  Associated symptoms include a 10 pound weight loss over the past month.    Tells me that he tried to drink the bowel prep overnight but he just feels full up and drinking it made him feel more nauseous and his abdomen seem to distend more.  Does tell me he had 2 successful enemas this morning, tells me though that the end of them "looks like one of my fishing hooks".    It is the patient's 79th birthday today.  He does live at home with his  wife who just had double knee replacement last year. They live 15 minutes away from the hospital. He has had both of his COVID vaccines.    Denies fever, chills, blood in his stool or symptoms that awaken him from sleep.  ER course: Pulse 109, blood pressure 152/101, potassium 3.0, CBC within normal limits, COVID-19 negative, CT abdomen pelvis shows partial distal colonic obstruction with moderate gaseous distention of the colon with air-fluid levels to the mid sigmoid colon, at that point there is suspicion for a mass or intussusception  Past Medical History:  Diagnosis Date  . 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 Chronic allergic rhinitis   . Coronary atherosclerosis of native coronary artery   . GERD (gastroesophageal reflux disease)   . High blood pressure   . Hypercholesterolemia   . Macrocytosis without anemia   . OSA on CPAP   . 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 Sleep apnea   . Umbilical hernia without obstruction or gangrene     Past Surgical History:  Procedure Laterality Date  . CARDIAC CATHETERIZATION  2000   Cath to rule out cardiac problems RT HTN. PT denies significant findings  . CARDIAC CATHETERIZATION  2008  . EYE SURGERY     BILATERAL CATARACT SURGERY WITH LENS IMPLANTS  . INSERTION OF MESH N/A 09/08/2016   Procedure: INSERTION OF MESH;  Surgeon: Jackolyn Confer, MD;  Location: [facility];  Service: General;  Laterality: N/A;  . right rotator cuff    . ROTATOR CUFF REPAIR    . TONSILLECTOMY    . UMBILICAL HERNIA REPAIR N/A 09/08/2016   Procedure: OPEN UMBILICAL HERNIA REPAIR WITH MESH;  Surgeon: Jackolyn Confer, MD;  Location: [facility];  Service: General;  Laterality: N/A;    Family History  Problem Relation Age of Onset  . Diabetes Mother   . CAD Father   . Melanoma Brother 11  . Healthy Daughter   . Healthy Son     Social History   Tobacco Use  . Smoking status: Never Smoker  . Smokeless tobacco:  Never Used  Substance Use Topics  . Alcohol use: Yes    Alcohol/week: 14.0 standard drinks    Types: 14 drink(s) per week    Comment: or more  . Drug use: No    Prior to Admission medications   Medication Sig Start Date End Date Taking? Authorizing Dilon Lank  atorvastatin (LIPITOR) 80 MG tablet TAKE 1 TABLET EVERY DAY  AT  6  PM Patient taking differently: Take 80 mg by mouth daily at 6 PM.  06/06/19  Yes Skains, Thana Farr, MD  cetirizine (ZYRTEC) 10 MG tablet Take 10 mg by mouth daily. ALLERTEC  AT 2 AM DAILY   Yes Loyce Flaming, Historical, MD  Chromium Picolinate 800 MCG TABS Take 1 tablet by mouth daily.   Yes Piper Albro, Historical, MD  finasteride (PROSCAR) 5 MG tablet Take 5 mg by mouth every morning. AT 2 AM DAILY   Yes Desmon Hitchner, Historical, MD  fluticasone (FLONASE) 50 MCG/ACT nasal spray Place 2 sprays into both nostrils daily as needed for allergies or rhinitis.   Yes Smantha Boakye, Historical, MD  ibuprofen (ADVIL,MOTRIN) 200 MG tablet Take 200 mg by mouth every 6 (six) hours as needed.   Yes Frederick Klinger, Historical, MD  lisinopril (PRINIVIL,ZESTRIL) 10 MG tablet Take 10 mg by mouth every morning. AT 2 AM DAILY   Yes Montel Vanderhoof, Historical, MD  Magnesium 400 MG TABS Take 1 tablet by mouth daily as needed.   Yes Yadhira Mckneely, Historical, MD  Omega-3 Fatty Acids (FISH OIL) 1200 MG CAPS Take 1,200 mg by mouth daily. IN AFTERNOON AFTER LUNCH   Yes Izaak Sahr, Historical, MD  vitamin B-12 (CYANOCOBALAMIN) 1000 MCG tablet Take 1,000 mcg by mouth daily. AT 2 AM DAILY   Yes Yaira Bernardi, Historical, MD  aspirin EC 81 MG tablet Take 1 tablet (81 mg total) by mouth every other day. Patient not taking: Reported on 10/28/2019 01/13/19   Jerline Pain, MD    Current Facility-Administered Medications  Medication Dose Route Frequency Adriane Guglielmo Last Rate Last Admin  . 0.9 %  sodium chloride infusion   Intravenous Continuous Garba, Mohammad L, MD      . dextrose 5 % and 0.9 % NaCl with KCl 40 mEq/L infusion   Intravenous  Continuous Elwyn Reach, MD 125 mL/hr at 10/29/19 0832 New Bag at 10/29/19 0832  . heparin injection 5,000 Units  5,000 Units Subcutaneous Q8H Elwyn Reach, MD   Stopped at 10/29/19 0730  . iohexol (OMNIPAQUE) 350 MG/ML injection 100 mL  100 mL Intravenous Once PRN Isla Pence, MD      . morphine 2 MG/ML injection 2 mg  2 mg Intravenous Q4H  PRN Elwyn Reach, MD   2 mg at 10/29/19 0941  . ondansetron (ZOFRAN) tablet 4 mg  4 mg Oral Q6H PRN Elwyn Reach, MD       Or  . ondansetron (ZOFRAN) injection 4 mg  4 mg Intravenous Q6H PRN Elwyn Reach, MD   4 mg at 10/29/19 0427    Allergies as of 10/28/2019 - Review Complete 10/28/2019  Allergen Reaction Noted  . Demerol Other (See Comments) 03/15/2011  . Promethazine hcl Other (See Comments) 03/15/2011     Review of Systems:    Constitutional: No fever or chills Skin: No rash  Cardiovascular: No chest pain Respiratory: No SOB  Gastrointestinal: See HPI and otherwise negative Genitourinary: No dysuria Neurological: No headache, dizziness or syncope Musculoskeletal: No new muscle or joint pain Hematologic: No bleeding  Psychiatric: No history of depression or anxiety    Physical Exam:  Vital signs in last 24 hours: Temp:  [97.8 F (36.6 C)-98.8 F (37.1 C)] 98.8 F (37.1 C) (03/17 0510) Pulse Rate:  [80-109] 97 (03/17 0510) Resp:  [16-20] 20 (03/17 0510) BP: (138-155)/(78-101) 155/101 (03/17 0510) SpO2:  [94 %-100 %] 95 % (03/17 0510) Last BM Date: 10/28/19 General:   Pleasant Elderly Caucasian male appears to be in NAD, Well developed, Well nourished, alert and cooperative Head:  Normocephalic and atraumatic. Eyes:   PEERL, EOMI. No icterus. Conjunctiva pink. Ears:  Normal auditory acuity. Neck:  Supple Throat: Oral cavity and pharynx without inflammation, swelling or lesion.  Lungs: Respirations even and unlabored. Lungs clear to auscultation bilaterally.   No wheezes, crackles, or rhonchi.  Heart:  Normal S1, S2. No MRG. Regular rate and rhythm. No peripheral edema, cyanosis or pallor.  Abdomen:  Soft, moderate distension, mild b/l lower abdominal ttp. No rebound or guarding. Decreased BS on the right, high pitched on the left,No appreciable masses or hepatomegaly. Rectal:  Not performed.  Msk:  Symmetrical without gross deformities. Peripheral pulses intact.  Extremities:  Without edema, no deformity or joint abnormality.  Neurologic:  Alert and  oriented x4;  grossly normal neurologically.  Skin:   Dry and intact without significant lesions or rashes. Psychiatric: Demonstrates good judgement and reason without abnormal affect or behaviors.   LAB RESULTS: Recent Labs    10/28/19 1552 10/29/19 0507  WBC 8.6 9.9  HGB 14.4 16.8  HCT 42.9 50.8  PLT 200 275   BMET Recent Labs    10/28/19 1552 10/29/19 0507  NA 139 136  K 3.0* 4.6  CL 107 99  CO2 21* 26  GLUCOSE 110* 143*  BUN 14 13  CREATININE 0.57* 0.86  CALCIUM 7.7* 9.3   LFT Recent Labs    10/29/19 0507  PROT 7.4  ALBUMIN 3.9  AST 19  ALT 15  ALKPHOS 80  BILITOT 0.6   STUDIES: CT Abdomen Pelvis W Contrast  Result Date: 10/28/2019 CLINICAL DATA:  Suspect diverticulitis. Acute abdominal pain the past 3 days. EXAM: CT ABDOMEN AND PELVIS WITH CONTRAST TECHNIQUE: Multidetector CT imaging of the abdomen and pelvis was performed using the standard protocol following bolus administration of intravenous contrast. Automatic exposure control utilized. CONTRAST:  OMNIPAQUE IOHEXOL 350 MG/ML SOLN, 173mL OMNIPAQUE IOHEXOL 300 MG/ML SOLN COMPARISON:  August 12, 2013 FINDINGS: Lower chest: Normal heart size. Right main coronary calcification. Small paraesophageal hernia. Calcified atherosclerosis of the thoracic aorta. Mild subpleural atelectasis. No dependent pleural effusion. Hepatobiliary: Several small gallstones within the dependent gallbladder lumen. No gallbladder wall thickening or dilatation. No  calcified stone within  the cystic or common bile duct. Mildly dilated 7 mm caliber of the proximal common bile duct with normal distal tapering. Fatty infiltration of the liver. Normal hepatic size and contours without ascites. Patent portal veins. Pancreas: Mild atrophy. No acute pancreatitis. Spleen: Normal. Adrenals/Urinary Tract: Normal right and left adrenal glands. Symmetrical moderate bilateral perinephric inflammatory change, likely medical renal disease. Normal bilateral renal cortical thickness. A 4.5 cm 8 Hounsfield unit simple appearing right renal lower pole exophytic cyst and smaller simple appearing bilateral cortical cysts. Nonobstructing right micro nephrolithiasis. No hydronephrosis or ureteral calculus bilaterally. Stomach/Bowel: Normal appendix, coronal image 39. Moderate gaseous distension throughout the colon with air-fluid levels to the level of the mid sigmoid colon where stool or soft tissue mucosal thickening is present in the bowel lumen. Decompressed distal sigmoid colon. Small amount of steatotic stool in the rectum. Mild diverticulosis of the distal sigmoid and descending colon. Decompressed small bowel and gastric lumen without an apparent mucosal abnormality. 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 Aortic calcified atherosclerosis without an abdominal aortic aneurysmal dilatation. Reproductive: Mild prostatomegaly. Other: Small bilateral inguinal herniations of fat without strangulation or incarceration. Musculoskeletal: Diffuse mild bone demineralization. Moderate skeletal degenerative change. Grade 1 anterolisthesis of L4 on L5 and grade 1 retrolisthesis of L5 on S1 and T12 on L1, likely degenerative. Pseudoarticulation of the L4 and L5 spinous processes. Severe osseous narrowing of the left L5-S1 neural foramen. Probable benign bone islands in the bilateral femur. These  results will be called to the ordering clinician or representative by the Radiologist Assistant, and communication documented in the PACS or [employer]. IMPRESSION: Partial distal colonic obstruction with moderate gaseous distension of the colon with air-fluid levels to the level of the mid sigmoid colon where there is either stool, colo-colonic intussusception, and/or abnormal and possibly malignant soft tissue mucosal mass. Correlation with colonoscopy recommended. No acute diverticulitis. Uncomplicated cholelithiasis. 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 Small paraesophageal hernia. Fatty infiltration of the liver. Mild prostatomegaly. Severe osseous narrowing of the left L5-S1 neural foramen, potentially impinging on the exiting left L5 nerve root. Aortic Atherosclerosis (ICD10-I70.0). Electronically Signed   By: Revonda Humphrey   On: 10/28/2019 17:27    Impression / Plan:   Impression: 1.  Partial large colon obstruction: Patient with abdominal distention and intermittent abdominal pain as well as nausea and vomiting worsening over the past 3 days ;likely from colonic mass versus intussusception 2.  Abnormal CT of the abdomen: As above with question of colon mass versus intussusception 3.  CAD  Plan: 1.  Patient was admitted last night with GoLYTELY prep but patient was unable to tolerate, 2 tap water enemas given today and 2 large BMs produced.  Ordered 1 more tapwater enema prior to planned flex sigmoidoscopy at 1130. 2.  Patient is scheduled for flexible sigmoidoscopy with Dr. Hilarie Fredrickson at [phone].  Did discuss risks, benefits, limitations and alternatives and the patient agrees to proceed. 3.  Patient will remain n.p.o. until after time of procedure. 4.  Please await further recommendations from Dr. Hilarie Fredrickson later today.  Thank you for your kind consultation, we will continue to follow.  Lavone Nian [facility]  10/29/2019, 9:58 AM

## 2019-10-29 NOTE — Progress Notes (Signed)
PROGRESS NOTE  Wesley [name]  [id number] DOB: 1940-11-23 DOA: 10/28/2019 PCP: Deland Pretty, MD   Brief Narrative: Wesley [name] is a 79 y.o. male with a history of CAD, HTN, HLD, OSA, BPH, GERD, and a couple months of abdominal pain, poor per oral intake, fatigue culminating in several days of constipation and abdominal distention. In the ED, a CT of the abdomen demonstrated distal partial obstruction with possible mass. Ht was admitted for colonoscopy 3/17.  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 - Follow up pathology from procedure this morning.  - Check CEA  CAD, HLD: No chest pain.  - Continue statin, holding ASA with possibility of procedure/resection  HTN:  - Will restart lisinopril  BPH:  - Continue finasteride  GERD:  - Continue PPI  DVT prophylaxis: Heparin 5,000 units q8h Code Status: Full Family Communication: None at bedside Disposition Plan: Plan to return home depending on further need for procedures.  Consultants:   GI  Surgery  Procedures:   Flexible sigmoidoscopy 3/17:  Findings:      The digital rectal exam was normal.      Extensive amounts of semi-liquid semi-solid stool was found in the       rectum and in the sigmoid colon, making visualization difficult. Copious       irrigation and lavage performed.      Many small-mouthed diverticula were found in the sigmoid colon.      There is a stricture at approximately 30 cm from the dentate line. This       is in a segment of severe diverticulosis and there is a polypoid       partially obstructing  mass at this location. Given the stricture and       poor preparation due to partial obstruction, the mass is difficult to       fully characterized, but appears to be partially circumferential. The       mass and stricture is rather short segment, approximately 3-4 cm in       length. No bleeding was present. Biopsies were taken on the mass and at       the stricture with a cold forceps for histology. Area well distal to       this mass was tattooed with 2 injections on opposite walls totally 4 mL       of Spot (carbon black). Of note colon above the stricture is dilated but       appears very viable though views limited by stool which could not be       cleared. Impression:       - Stool in the rectum and in the sigmoid colon.                            Visualization limited.                           - Diverticulosis in the sigmoid colon.                           -  Rule out malignancy, partially obstructing tumor                            causing narrowing as above in the sigmoid colon.                            Biopsied. Tattooed.  Antimicrobials:  None   Subjective: Wished him a happy birthday. He feels fatigued and had trouble tolerating prep but had BMs with lower treatments. Abdomen is still distended, minimal pain. No nausea currently.  Objective: Vitals:   10/29/19 1240 10/29/19 1250 10/29/19 1255 10/29/19 1335  BP: (!) 167/106 (!) 167/106 (!) 161/110 (!) 142/106  Pulse:    94  Resp: 17 18 17 18   Temp:    98.2 F (36.8 C)  TempSrc:    Oral  SpO2: 94% 97% 97% 92%  Weight:      Height:        Intake/Output Summary (Last 24 hours) at 10/29/2019 1519 Last data filed at 10/29/2019 1510 Gross per 24 hour  Intake 1348.97 ml  Output --  Net 1348.97 ml   Filed Weights   10/29/19 1104  Weight: 81.6 kg    Gen: 79 y.o. male in no distress  Pulm: Non-labored breathing room air. Clear to auscultation bilaterally.  CV: Regular rate and rhythm. No murmur, rub, or  gallop. No JVD, no pedal edema. GI: Abdomen soft, non-tender, distended, with normoactive bowel sounds. No organomegaly or masses felt. Ext: Warm, no deformities Skin: No rashes, lesions or ulcers Neuro: Alert and oriented. No focal neurological deficits. Psych: Judgement and insight appear normal. Mood & affect appropriate.   Data Reviewed: I have personally reviewed following labs and imaging studies  CBC: Recent Labs  Lab 10/28/19 1552 10/29/19 0507  WBC 8.6 9.9  NEUTROABS 6.8  --   HGB 14.4 16.8  HCT 42.9 50.8  MCV 98.8 97.7  PLT 200 [id number]   Basic Metabolic Panel: Recent Labs  Lab 10/28/19 1552 10/29/19 0507  NA 139 136  K 3.0* 4.6  CL 107 99  CO2 21* 26  GLUCOSE 110* 143*  BUN 14 13  CREATININE 0.57* 0.86  CALCIUM 7.7* 9.3   GFR: Estimated Creatinine Clearance: 68.5 mL/min (by C-G formula based on SCr of 0.86 mg/dL). Liver Function Tests: Recent Labs  Lab 10/28/19 1552 10/29/19 0507  AST 18 19  ALT 15 15  ALKPHOS 72 80  BILITOT 0.9 0.6  PROT 6.2* 7.4  ALBUMIN 3.4* 3.9   Recent Labs  Lab 10/28/19 1552  LIPASE 20   No results for input(s): AMMONIA in the last 168 hours. Coagulation Profile: No results for input(s): INR, PROTIME in the last 168 hours. Cardiac Enzymes: No results for input(s): CKTOTAL, CKMB, CKMBINDEX, TROPONINI in the last 168 hours. BNP (last 3 results) No results for input(s): PROBNP in the last 8760 hours. HbA1C: No results for input(s): HGBA1C in the last 72 hours. CBG: No results for input(s): GLUCAP in the last 168 hours. Lipid Profile: No results for input(s): CHOL, HDL, LDLCALC, TRIG, CHOLHDL, LDLDIRECT in the last 72 hours. Thyroid Function Tests: No results for input(s): TSH, T4TOTAL, FREET4, T3FREE, THYROIDAB in the last 72 hours. Anemia Panel: No results for input(s): VITAMINB12, FOLATE, FERRITIN, TIBC, IRON, RETICCTPCT in the last 72 hours. Urine analysis:    Component Value Date/Time   COLORURINE YELLOW  10/28/2019 1800   APPEARANCEUR CLEAR 10/28/2019  1800   LABSPEC 1.034 (H) 10/28/2019 1800   PHURINE 5.0 10/28/2019 1800   GLUCOSEU NEGATIVE 10/28/2019 1800   HGBUR SMALL (A) 10/28/2019 1800   BILIRUBINUR NEGATIVE 10/28/2019 1800   KETONESUR 20 (A) 10/28/2019 1800   PROTEINUR NEGATIVE 10/28/2019 1800   NITRITE NEGATIVE 10/28/2019 1800   LEUKOCYTESUR NEGATIVE 10/28/2019 1800   Recent Results (from the past 240 hour(s))  Respiratory Panel by RT PCR (Flu A&B, Covid) - Nasopharyngeal Swab     Status: None   Collection Time: 10/28/19  7:17 PM   Specimen: Nasopharyngeal Swab  Result Value Ref Range Status   SARS Coronavirus 2 by RT PCR NEGATIVE NEGATIVE Final    Comment: (NOTE) SARS-CoV-2 target nucleic acids are NOT DETECTED. The SARS-CoV-2 RNA is generally detectable in upper respiratoy specimens during the acute phase of infection. The lowest concentration of SARS-CoV-2 viral copies this assay can detect is 131 copies/mL. A negative result does not preclude SARS-Cov-2 infection and should not be used as the sole basis for treatment or other patient management decisions. A negative result may occur with  improper specimen collection/handling, submission of specimen other than nasopharyngeal swab, presence of viral mutation(s) within the areas targeted by this assay, and inadequate number of viral copies (<131 copies/mL). A negative result must be combined with clinical observations, patient history, and epidemiological information. The expected result is Negative. Fact Sheet for Patients:  PinkCheek.be Fact Sheet for Healthcare Providers:  GravelBags.it This test is not yet ap proved or cleared by the [place] FDA and  has been authorized for detection and/or diagnosis of SARS-CoV-2 by FDA under an Emergency Use Authorization (EUA). This EUA will remain  in effect (meaning this test can be used) for the duration of  the COVID-19 declaration under Section 564(b)(1) of the Act, 21 U.S.C. section 360bbb-3(b)(1), unless the authorization is terminated or revoked sooner.    Influenza A by PCR NEGATIVE NEGATIVE Final   Influenza B by PCR NEGATIVE NEGATIVE Final    Comment: (NOTE) The Xpert Xpress SARS-CoV-2/FLU/RSV assay is intended as an aid in  the diagnosis of influenza from Nasopharyngeal swab specimens and  should not be used as a sole basis for treatment. Nasal washings and  aspirates are unacceptable for Xpert Xpress SARS-CoV-2/FLU/RSV  testing. Fact Sheet for Patients: PinkCheek.be Fact Sheet for Healthcare Providers: GravelBags.it This test is not yet approved or cleared by the [place] FDA and  has been authorized for detection and/or diagnosis of SARS-CoV-2 by  FDA under an Emergency Use Authorization (EUA). This EUA will remain  in effect (meaning this test can be used) for the duration of the  Covid-19 declaration under Section 564(b)(1) of the Act, 21  U.S.C. section 360bbb-3(b)(1), unless the authorization is  terminated or revoked. Performed at [facility], [place] [address], [place], [place] [address]       Radiology Studies: CT Abdomen Pelvis W Contrast  Result Date: 10/28/2019 CLINICAL DATA:  Suspect diverticulitis. Acute abdominal pain the past 3 days. EXAM: CT ABDOMEN AND PELVIS WITH CONTRAST TECHNIQUE: Multidetector CT imaging of the abdomen and pelvis was performed using the standard protocol following bolus administration of intravenous contrast. Automatic exposure control utilized. CONTRAST:  OMNIPAQUE IOHEXOL 350 MG/ML SOLN, 112mL OMNIPAQUE IOHEXOL 300 MG/ML SOLN COMPARISON:  August 12, 2013 FINDINGS: Lower chest: Normal heart size. Right main coronary calcification. Small paraesophageal hernia. Calcified atherosclerosis of the thoracic aorta. Mild subpleural atelectasis. No dependent pleural  effusion. Hepatobiliary: Several small gallstones within the dependent  gallbladder lumen. No gallbladder wall thickening or dilatation. No calcified stone within the cystic or common bile duct. Mildly dilated 7 mm caliber of the proximal common bile duct with normal distal tapering. Fatty infiltration of the liver. Normal hepatic size and contours without ascites. Patent portal veins. Pancreas: Mild atrophy. No acute pancreatitis. Spleen: Normal. Adrenals/Urinary Tract: Normal right and left adrenal glands. Symmetrical moderate bilateral perinephric inflammatory change, likely medical renal disease. Normal bilateral renal cortical thickness. A 4.5 cm 8 Hounsfield unit simple appearing right renal lower pole exophytic cyst and smaller simple appearing bilateral cortical cysts. Nonobstructing right micro nephrolithiasis. No hydronephrosis or ureteral calculus bilaterally. Stomach/Bowel: Normal appendix, coronal image 39. Moderate gaseous distension throughout the colon with air-fluid levels to the level of the mid sigmoid colon where stool or soft tissue mucosal thickening is present in the bowel lumen. Decompressed distal sigmoid colon. Small amount of steatotic stool in the rectum. Mild diverticulosis of the distal sigmoid and descending colon. Decompressed small bowel and gastric lumen without an apparent mucosal abnormality. 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 Aortic calcified atherosclerosis without an abdominal aortic aneurysmal dilatation. Reproductive: Mild prostatomegaly. Other: Small bilateral inguinal herniations of fat without strangulation or incarceration. Musculoskeletal: Diffuse mild bone demineralization. Moderate skeletal degenerative change. Grade 1 anterolisthesis of L4 on L5 and grade 1 retrolisthesis of L5 on S1 and T12 on L1, likely degenerative.  Pseudoarticulation of the L4 and L5 spinous processes. Severe osseous narrowing of the left L5-S1 neural foramen. Probable benign bone islands in the bilateral femur. These results will be called to the ordering clinician or representative by the Radiologist Assistant, and communication documented in the PACS or [employer]. IMPRESSION: Partial distal colonic obstruction with moderate gaseous distension of the colon with air-fluid levels to the level of the mid sigmoid colon where there is either stool, colo-colonic intussusception, and/or abnormal and possibly malignant soft tissue mucosal mass. Correlation with colonoscopy recommended. No acute diverticulitis. Uncomplicated cholelithiasis. 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 Small paraesophageal hernia. Fatty infiltration of the liver. Mild prostatomegaly. Severe osseous narrowing of the left L5-S1 neural foramen, potentially impinging on the exiting left L5 nerve root. Aortic Atherosclerosis (ICD10-I70.0). Electronically Signed   By: Revonda Humphrey   On: 10/28/2019 17:27    Scheduled Meds: . heparin  5,000 Units Subcutaneous Q8H  . polyethylene glycol powder  1 Container Oral Once   Continuous Infusions: . sodium chloride    . dextrose 5 % and 0.9 % NaCl with KCl 40 mEq/L 125 mL/hr at 10/29/19 0832     LOS: 1 day   Time spent: 25 minutes.  Patrecia Pour, MD [facility] [email or web address] 10/29/2019, 3:19 PM

## 2019-10-29 NOTE — Op Note (Signed)
[facility] Patient Name: Wesley [name] Procedure Date: 10/29/2019 MRN: [id number] Attending MD: Jerene Bears , MD Date of Birth: 04-21-1941 CSN: [id number] Age: 79 Admit Type: Inpatient Procedure:                Flexible Sigmoidoscopy Indications:              Abnormal CT of the GI tract, partial colonic                            obstruction, recent constipation Providers:                Lajuan Lines. Hilarie Fredrickson, MD, Cleda Daub, RN, William Dalton, Technician Referring MD:             [facility] Medicines:                Fentanyl 50 micrograms IV, Midazolam 3 mg IV Complications:            No immediate complications. Estimated Blood Loss:     Estimated blood loss was minimal. Procedure:                Pre-Anesthesia Assessment:                           - Prior to the procedure, a History and Physical                            was performed, and patient medications and                            allergies were reviewed. The patient's tolerance of                            previous anesthesia was also reviewed. The risks                            and benefits of the procedure and the sedation                            options and risks were discussed with the patient.                            All questions were answered, and informed consent                            was obtained. Prior Anticoagulants: The patient has                            taken no previous anticoagulant or antiplatelet                            agents. ASA Grade Assessment: III - A patient with  severe systemic disease. After reviewing the risks                            and benefits, the patient was deemed in                            satisfactory condition to undergo the procedure.                           After obtaining informed consent, the scope was                            passed under direct vision. The [id number]                             [id number]) Olympus pediatric colonoscope was                            introduced through the anus and advanced to the                            sigmoid colon. The flexible sigmoidoscopy was                            somewhat difficult due to poor bowel prep and poor                            endoscopic visualization. Scope In: 11:56:23 AM Scope Out: 12:19:52 PM Total Procedure Duration: 0 hours 23 minutes 29 seconds  Findings:      The digital rectal exam was normal.      Extensive amounts of semi-liquid semi-solid stool was found in the       rectum and in the sigmoid colon, making visualization difficult. Copious       irrigation and lavage performed.      Many small-mouthed diverticula were found in the sigmoid colon.      There is a stricture at approximately 30 cm from the dentate line. This       is in a segment of severe diverticulosis and there is a polypoid       partially obstructing mass at this location. Given the stricture and       poor preparation due to partial obstruction, the mass is difficult to       fully characterized, but appears to be partially circumferential. The       mass and stricture is rather short segment, approximately 3-4 cm in       length. No bleeding was present. Biopsies were taken on the mass and at       the stricture with a cold forceps for histology. Area well distal to       this mass was tattooed with 2 injections on opposite walls totally 4 mL       of Spot (carbon black). Of note colon above the stricture is dilated but       appears very viable though views limited by stool which could not be       cleared. Impression:               -  Stool in the rectum and in the sigmoid colon.                            Visualization limited.                           - Diverticulosis in the sigmoid colon.                           - Rule out malignancy, partially obstructing tumor                            causing  narrowing as above in the sigmoid colon.                            Biopsied. Tattooed. Moderate Sedation:      Moderate (conscious) sedation was administered by the endoscopy nurse       and supervised by the endoscopist. The patient's oxygen saturation,       heart rate, blood pressure and response to care were monitored. Total       physician intraservice time was 34 minutes. Recommendation:           - Return patient to hospital ward for ongoing care.                           - Clear liquid diet.                           - Await pathology results.                           - Gentle bowel preparation as tolerated slowly PO.                           - Surgical consultation. Procedure Code(s):        --- Professional ---                           [phone], Sigmoidoscopy, flexible; with biopsy, single                            or multiple                           [id number], Sigmoidoscopy, flexible; with directed                            submucosal injection(s), any substance                           [id number], 59, Moderate sedation services provided by                            the same physician or other qualified health care  professional performing the diagnostic or                            therapeutic service that the sedation supports,                            requiring the presence of an independent trained                            observer to assist in the monitoring of the                            patient's level of consciousness and physiological                            status; initial 15 minutes of intraservice time,                            patient age 69 years or older                           [phone], Moderate sedation; each additional 15                            minutes intraservice time Diagnosis Code(s):        --- Professional ---                           D49.0, Neoplasm of unspecified behavior of                             digestive system                           K56.690, Other partial intestinal obstruction                           K59.00, Constipation, unspecified                           K57.30, Diverticulosis of large intestine without                            perforation or abscess without bleeding                           R93.3, Abnormal findings on diagnostic imaging of                            other parts of digestive tract CPT copyright 2019 [employer]. All rights reserved. The codes documented in this report are preliminary and upon coder review may  be revised to meet current compliance requirements. Jerene Bears, MD 10/29/2019 12:34:59 PM This report has been signed electronically. Number of Addenda: 0

## 2019-10-29 NOTE — Consult Note (Signed)
[facility] Surgery Consult Note  Wesley [name] Nov 29, 1940  [id number].    Requesting MD: Pyrtle Chief Complaint/Reason for Consult: partially obstructing sigmoid colon mass  HPI:  Patient is a 79 year old male who presented to [facility] via EMS yesterday with abdominal pain, abdominal distention and nausea. Patient reports that he has had issues with constipation for the last 3 months which he initially started taking miralax for after talking with PCP. Constipation initially improved but in the past month he has become more symptomatic again. For the last 3 days, patient experienced worsening distention, cramping, constipation and developed nausea and vomiting. He denies fever, chills, SOB, chest pain, urinary symptoms, bloody stools. He does have a history of one episode diverticulitis when he was in college at [employer], no episodes since this. Last colonoscopy was about 20 years ago and he does not remember anything remarkable about it other than diverticulosis. He does not know of any family history of colon cancer. He does report 10 lb weight loss over the last month. Patient did not tolerate bowel prep overnight, but did have 2 enemas with some passage of stool. He underwent flexible sigmoidoscopy today which revealed sigmoid stricture and partially obstructing mass. Patient currently denies abdominal pain or nausea. Feels like distention may be only slightly improved. PMH otherwise significant for CAD, HTN, HLD, BPH, GERD. Patient is not on any blood thinning medications. Past abdominal surgery includes umbilical hernia repair with mesh in 2018 by Dr. Zella Richer. Patient lives at home with his wife of 53 years. He reports he usually drinks 1 glass of wine per day, has never smoked, and does not use illicit drugs.    ROS: Review of Systems  Constitutional: Positive for weight loss (10 lbs). Negative for chills and fever.  Respiratory: Negative for shortness of breath and wheezing.    Cardiovascular: Negative for chest pain, palpitations and orthopnea.  Gastrointestinal: Positive for abdominal pain, constipation, nausea and vomiting. Negative for blood in stool and melena.  Genitourinary: Negative for dysuria, frequency and urgency.  All other systems reviewed and are negative.   Family History  Problem Relation Age of Onset  . Diabetes Mother   . CAD Father   . Melanoma Brother 72  . Healthy Daughter   . Healthy Son     Past Medical History:  Diagnosis Date  . 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 Chronic allergic rhinitis   . Coronary atherosclerosis of native coronary artery   . GERD (gastroesophageal reflux disease)   . High blood pressure   . Hypercholesterolemia   . Macrocytosis without anemia   . OSA on CPAP   . 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 Sleep apnea   . Umbilical hernia without obstruction or gangrene     Past Surgical History:  Procedure Laterality Date  . CARDIAC CATHETERIZATION  2000   Cath to rule out cardiac problems RT HTN. PT denies significant findings  . CARDIAC CATHETERIZATION  2008  . EYE SURGERY     BILATERAL CATARACT SURGERY WITH LENS IMPLANTS  . INSERTION OF MESH N/A 09/08/2016   Procedure: INSERTION OF MESH;  Surgeon: Jackolyn Confer, MD;  Location: [facility] ORS;  Service: General;  Laterality: N/A;  . right rotator cuff    . ROTATOR CUFF REPAIR    . TONSILLECTOMY    . UMBILICAL HERNIA REPAIR N/A 09/08/2016   Procedure: OPEN UMBILICAL HERNIA REPAIR WITH MESH;  Surgeon: Jackolyn Confer,  MD;  Location: [facility];  Service: General;  Laterality: N/A;    Social History:  reports that he has never smoked. He has never used smokeless tobacco. He reports current alcohol use of about 14.0 standard drinks of alcohol per week. He reports that he does not use drugs.  Allergies:  Allergies  Allergen Reactions  . Demerol Other (See Comments)    Causes dizziness  . Promethazine Hcl  Other (See Comments)    Dizziness     Medications Prior to Admission  Medication Sig Dispense Refill  . atorvastatin (LIPITOR) 80 MG tablet TAKE 1 TABLET EVERY DAY  AT  6  PM (Patient taking differently: Take 80 mg by mouth daily at 6 PM. ) 90 tablet 3  . cetirizine (ZYRTEC) 10 MG tablet Take 10 mg by mouth daily. ALLERTEC  AT 2 AM DAILY    . Chromium Picolinate 800 MCG TABS Take 1 tablet by mouth daily.    . finasteride (PROSCAR) 5 MG tablet Take 5 mg by mouth every morning. AT 2 AM DAILY    . fluticasone (FLONASE) 50 MCG/ACT nasal spray Place 2 sprays into both nostrils daily as needed for allergies or rhinitis.    Marland Kitchen ibuprofen (ADVIL,MOTRIN) 200 MG tablet Take 200 mg by mouth every 6 (six) hours as needed.    Marland Kitchen lisinopril (PRINIVIL,ZESTRIL) 10 MG tablet Take 10 mg by mouth every morning. AT 2 AM DAILY    . Magnesium 400 MG TABS Take 1 tablet by mouth daily as needed.    . Omega-3 Fatty Acids (FISH OIL) 1200 MG CAPS Take 1,200 mg by mouth daily. IN AFTERNOON AFTER LUNCH    . vitamin B-12 (CYANOCOBALAMIN) 1000 MCG tablet Take 1,000 mcg by mouth daily. AT 2 AM DAILY    . aspirin EC 81 MG tablet Take 1 tablet (81 mg total) by mouth every other day. (Patient not taking: Reported on 10/28/2019)      Blood pressure (!) 142/106, pulse 94, temperature 98.2 F (36.8 C), temperature source Oral, resp. rate 18, height 5\' 8"  (1.727 m), weight 81.6 kg, SpO2 92 %. Physical Exam:  General: pleasant, WD, WN white male who is laying in bed in NAD HEENT:   Sclera are noninjected.  PERRL.  Ears and nose without any masses or lesions.  Mouth is pink and moist Heart: regular, rate, and rhythm.  Normal s1,s2. No obvious murmurs, gallops, or rubs noted.  Palpable radial and pedal pulses bilaterally Lungs: CTAB, no wheezes, rhonchi, or rales noted.  Respiratory effort nonlabored Abd: soft, NT, distended, +BS, no masses, hernias, or organomegaly MS: all 4 extremities are symmetrical with no cyanosis, clubbing, or  edema. Skin: warm and dry with no masses, lesions, or rashes Neuro: Cranial nerves 2-12 grossly intact, sensation grossly intact throughout Psych: A&Ox3 with an appropriate affect.   Results for orders placed or performed during the hospital encounter of 10/28/19 (from the past 48 hour(s))  CBC with Differential     Status: None   Collection Time: 10/28/19  3:52 PM  Result Value Ref Range   WBC 8.6 4.0 - 10.5 K/uL   RBC 4.34 4.22 - 5.81 MIL/uL   Hemoglobin 14.4 13.0 - 17.0 g/dL   HCT 42.9 39.0 - 52.0 %   MCV 98.8 80.0 - 100.0 fL   MCH 33.2 26.0 - 34.0 pg   MCHC 33.6 30.0 - 36.0 g/dL   RDW 13.2 11.5 - 15.5 %   Platelets 200 150 - 400 K/uL  nRBC 0.0 0.0 - 0.2 %   Neutrophils Relative % 79 %   Neutro Abs 6.8 1.7 - 7.7 K/uL   Lymphocytes Relative 9 %   Lymphs Abs 0.8 0.7 - 4.0 K/uL   Monocytes Relative 12 %   Monocytes Absolute 1.0 0.1 - 1.0 K/uL   Eosinophils Relative 0 %   Eosinophils Absolute 0.0 0.0 - 0.5 K/uL   Basophils Relative 0 %   Basophils Absolute 0.0 0.0 - 0.1 K/uL   Immature Granulocytes 0 %   Abs Immature Granulocytes 0.03 0.00 - 0.07 K/uL    Comment: Performed at [facility], [place] [address], [place], [place] [address]  Comprehensive metabolic panel     Status: Abnormal   Collection Time: 10/28/19  3:52 PM  Result Value Ref Range   Sodium 139 135 - 145 mmol/L   Potassium 3.0 (L) 3.5 - 5.1 mmol/L   Chloride 107 98 - 111 mmol/L   CO2 21 (L) 22 - 32 mmol/L   Glucose, Bld 110 (H) 70 - 99 mg/dL    Comment: Glucose reference range applies only to samples taken after fasting for at least 8 hours.   BUN 14 8 - 23 mg/dL   Creatinine, Ser 0.57 (L) 0.61 - 1.24 mg/dL   Calcium 7.7 (L) 8.9 - 10.3 mg/dL   Total Protein 6.2 (L) 6.5 - 8.1 g/dL   Albumin 3.4 (L) 3.5 - 5.0 g/dL   AST 18 15 - 41 U/L   ALT 15 0 - 44 U/L   Alkaline Phosphatase 72 38 - 126 U/L   Total Bilirubin 0.9 0.3 - 1.2 mg/dL   GFR calc non Af Amer >60 >60 mL/min   GFR calc Af Amer  >60 >60 mL/min   Anion gap 11 5 - 15    Comment: Performed at [facility], [place] [address] [place], [place] [address]  Lipase, blood     Status: None   Collection Time: 10/28/19  3:52 PM  Result Value Ref Range   Lipase 20 11 - 51 U/L    Comment: Performed at [facility], [place] [address] [place], [place] [address]  Urinalysis, Routine w reflex microscopic     Status: Abnormal   Collection Time: 10/28/19  6:00 PM  Result Value Ref Range   Color, Urine YELLOW YELLOW   APPearance CLEAR CLEAR   Specific Gravity, Urine 1.034 (H) 1.005 - 1.030   pH 5.0 5.0 - 8.0   Glucose, UA NEGATIVE NEGATIVE mg/dL   Hgb urine dipstick SMALL (A) NEGATIVE   Bilirubin Urine NEGATIVE NEGATIVE   Ketones, ur 20 (A) NEGATIVE mg/dL   Protein, ur NEGATIVE NEGATIVE mg/dL   Nitrite NEGATIVE NEGATIVE   Leukocytes,Ua NEGATIVE NEGATIVE   RBC / HPF 0-5 0 - 5 RBC/hpf   WBC, UA 0-5 0 - 5 WBC/hpf   Bacteria, UA NONE SEEN NONE SEEN   Mucus PRESENT     Comment: Performed at [facility], [place] [address] [place], [place] [address]  Respiratory Panel by RT PCR (Flu A&B, Covid) - Nasopharyngeal Swab     Status: None   Collection Time: 10/28/19  7:17 PM   Specimen: Nasopharyngeal Swab  Result Value Ref Range   SARS Coronavirus 2 by RT PCR NEGATIVE NEGATIVE    Comment: (NOTE) SARS-CoV-2 target nucleic acids are NOT DETECTED. The SARS-CoV-2 RNA is generally detectable in upper respiratoy specimens during the acute phase of infection. The lowest concentration of SARS-CoV-2  viral copies this assay can detect is 131 copies/mL. A negative result does not preclude SARS-Cov-2 infection and should not be used as the sole basis for treatment or other patient management decisions. A negative result may occur with  improper specimen collection/handling, submission of specimen other than nasopharyngeal swab, presence of viral mutation(s) within the areas targeted by this  assay, and inadequate number of viral copies (<131 copies/mL). A negative result must be combined with clinical observations, patient history, and epidemiological information. The expected result is Negative. Fact Sheet for Patients:  PinkCheek.be Fact Sheet for Healthcare Providers:  GravelBags.it This test is not yet ap proved or cleared by the [place] FDA and  has been authorized for detection and/or diagnosis of SARS-CoV-2 by FDA under an Emergency Use Authorization (EUA). This EUA will remain  in effect (meaning this test can be used) for the duration of the COVID-19 declaration under Section 564(b)(1) of the Act, 21 U.S.C. section 360bbb-3(b)(1), unless the authorization is terminated or revoked sooner.    Influenza A by PCR NEGATIVE NEGATIVE   Influenza B by PCR NEGATIVE NEGATIVE    Comment: (NOTE) The Xpert Xpress SARS-CoV-2/FLU/RSV assay is intended as an aid in  the diagnosis of influenza from Nasopharyngeal swab specimens and  should not be used as a sole basis for treatment. Nasal washings and  aspirates are unacceptable for Xpert Xpress SARS-CoV-2/FLU/RSV  testing. Fact Sheet for Patients: PinkCheek.be Fact Sheet for Healthcare Providers: GravelBags.it This test is not yet approved or cleared by the [place] FDA and  has been authorized for detection and/or diagnosis of SARS-CoV-2 by  FDA under an Emergency Use Authorization (EUA). This EUA will remain  in effect (meaning this test can be used) for the duration of the  Covid-19 declaration under Section 564(b)(1) of the Act, 21  U.S.C. section 360bbb-3(b)(1), unless the authorization is  terminated or revoked. Performed at [facility], [place] [address], [place], [place] [address]   Comprehensive metabolic panel     Status: Abnormal   Collection Time: 10/29/19  5:07 AM   Result Value Ref Range   Sodium 136 135 - 145 mmol/L   Potassium 4.6 3.5 - 5.1 mmol/L    Comment: DELTA CHECK NOTED NO VISIBLE HEMOLYSIS    Chloride 99 98 - 111 mmol/L   CO2 26 22 - 32 mmol/L   Glucose, Bld 143 (H) 70 - 99 mg/dL    Comment: Glucose reference range applies only to samples taken after fasting for at least 8 hours.   BUN 13 8 - 23 mg/dL   Creatinine, Ser 0.86 0.61 - 1.24 mg/dL   Calcium 9.3 8.9 - 10.3 mg/dL   Total Protein 7.4 6.5 - 8.1 g/dL   Albumin 3.9 3.5 - 5.0 g/dL   AST 19 15 - 41 U/L   ALT 15 0 - 44 U/L   Alkaline Phosphatase 80 38 - 126 U/L   Total Bilirubin 0.6 0.3 - 1.2 mg/dL   GFR calc non Af Amer >60 >60 mL/min   GFR calc Af Amer >60 >60 mL/min   Anion gap 11 5 - 15    Comment: Performed at [facility], [place] [address] [place], [place] [address]  CBC     Status: None   Collection Time: 10/29/19  5:07 AM  Result Value Ref Range   WBC 9.9 4.0 - 10.5 K/uL   RBC 5.20 4.22 - 5.81 MIL/uL   Hemoglobin 16.8 13.0 - 17.0 g/dL  HCT 50.8 39.0 - 52.0 %   MCV 97.7 80.0 - 100.0 fL   MCH 32.3 26.0 - 34.0 pg   MCHC 33.1 30.0 - 36.0 g/dL   RDW 13.2 11.5 - 15.5 %   Platelets 275 150 - 400 K/uL   nRBC 0.0 0.0 - 0.2 %    Comment: Performed at [facility], [place] [address], [place], [place] [address]   CT Abdomen Pelvis W Contrast  Result Date: 10/28/2019 CLINICAL DATA:  Suspect diverticulitis. Acute abdominal pain the past 3 days. EXAM: CT ABDOMEN AND PELVIS WITH CONTRAST TECHNIQUE: Multidetector CT imaging of the abdomen and pelvis was performed using the standard protocol following bolus administration of intravenous contrast. Automatic exposure control utilized. CONTRAST:  OMNIPAQUE IOHEXOL 350 MG/ML SOLN, 195mL OMNIPAQUE IOHEXOL 300 MG/ML SOLN COMPARISON:  August 12, 2013 FINDINGS: Lower chest: Normal heart size. Right main coronary calcification. Small paraesophageal hernia. Calcified atherosclerosis of the thoracic aorta.  Mild subpleural atelectasis. No dependent pleural effusion. Hepatobiliary: Several small gallstones within the dependent gallbladder lumen. No gallbladder wall thickening or dilatation. No calcified stone within the cystic or common bile duct. Mildly dilated 7 mm caliber of the proximal common bile duct with normal distal tapering. Fatty infiltration of the liver. Normal hepatic size and contours without ascites. Patent portal veins. Pancreas: Mild atrophy. No acute pancreatitis. Spleen: Normal. Adrenals/Urinary Tract: Normal right and left adrenal glands. Symmetrical moderate bilateral perinephric inflammatory change, likely medical renal disease. Normal bilateral renal cortical thickness. A 4.5 cm 8 Hounsfield unit simple appearing right renal lower pole exophytic cyst and smaller simple appearing bilateral cortical cysts. Nonobstructing right micro nephrolithiasis. No hydronephrosis or ureteral calculus bilaterally. Stomach/Bowel: Normal appendix, coronal image 39. Moderate gaseous distension throughout the colon with air-fluid levels to the level of the mid sigmoid colon where stool or soft tissue mucosal thickening is present in the bowel lumen. Decompressed distal sigmoid colon. Small amount of steatotic stool in the rectum. Mild diverticulosis of the distal sigmoid and descending colon. Decompressed small bowel and gastric lumen without an apparent mucosal abnormality. 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 Aortic calcified atherosclerosis without an abdominal aortic aneurysmal dilatation. Reproductive: Mild prostatomegaly. Other: Small bilateral inguinal herniations of fat without strangulation or incarceration. Musculoskeletal: Diffuse mild bone demineralization. Moderate skeletal degenerative change. Grade 1 anterolisthesis of L4 on L5 and grade 1 retrolisthesis of L5 on  S1 and T12 on L1, likely degenerative. Pseudoarticulation of the L4 and L5 spinous processes. Severe osseous narrowing of the left L5-S1 neural foramen. Probable benign bone islands in the bilateral femur. These results will be called to the ordering clinician or representative by the Radiologist Assistant, and communication documented in the PACS or [employer]. IMPRESSION: Partial distal colonic obstruction with moderate gaseous distension of the colon with air-fluid levels to the level of the mid sigmoid colon where there is either stool, colo-colonic intussusception, and/or abnormal and possibly malignant soft tissue mucosal mass. Correlation with colonoscopy recommended. No acute diverticulitis. Uncomplicated cholelithiasis. 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 Small paraesophageal hernia. Fatty infiltration of the liver. Mild prostatomegaly. Severe osseous narrowing of the left L5-S1 neural foramen, potentially impinging on the exiting left L5 nerve root. Aortic Atherosclerosis (ICD10-I70.0). 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

## 2019-10-29 NOTE — Progress Notes (Signed)
Pt unable to tolerate golytely bowel prep. States "I'm just too full." Tap water enema given, 2 large BMs prodcued.

## 2019-10-30 ENCOUNTER — Other Ambulatory Visit: Payer: Self-pay | Admitting: Urology

## 2019-10-30 ENCOUNTER — Ambulatory Visit: Payer: Medicare Other | Admitting: Gastroenterology

## 2019-10-30 ENCOUNTER — Encounter: Payer: Self-pay | Admitting: *Deleted

## 2019-10-30 LAB — HEMOGLOBIN A1C
Hgb A1c MFr Bld: 5.6 % (ref 4.8–5.6)
Mean Plasma Glucose: 114.02 mg/dL

## 2019-10-30 LAB — SURGICAL PATHOLOGY

## 2019-10-30 MED ORDER — SODIUM CHLORIDE 0.9 % IV SOLN
2.0000 g | INTRAVENOUS | Status: AC
  Administered 2019-10-31: 13:00:00 2 g via INTRAVENOUS
  Filled 2019-10-30: qty 2

## 2019-10-30 MED ORDER — ALUM & MAG HYDROXIDE-SIMETH 200-200-20 MG/5ML PO SUSP
30.0000 mL | ORAL | Status: DC | PRN
  Administered 2019-10-30: 30 mL via ORAL
  Filled 2019-10-30: qty 30

## 2019-10-30 MED ORDER — FINASTERIDE 5 MG PO TABS
5.0000 mg | ORAL_TABLET | Freq: Every day | ORAL | Status: DC
  Administered 2019-10-30 – 2019-11-05 (×6): 5 mg via ORAL
  Filled 2019-10-30 (×6): qty 1

## 2019-10-30 MED ORDER — METRONIDAZOLE 500 MG PO TABS
1000.0000 mg | ORAL_TABLET | ORAL | Status: AC
  Administered 2019-10-30 (×3): 1000 mg via ORAL
  Filled 2019-10-30 (×3): qty 2

## 2019-10-30 MED ORDER — GABAPENTIN 300 MG PO CAPS
300.0000 mg | ORAL_CAPSULE | ORAL | Status: AC
  Administered 2019-10-31: 300 mg via ORAL
  Filled 2019-10-30: qty 1

## 2019-10-30 MED ORDER — CHLORHEXIDINE GLUCONATE CLOTH 2 % EX PADS
6.0000 | MEDICATED_PAD | Freq: Once | CUTANEOUS | Status: AC
  Administered 2019-10-30: 6 via TOPICAL

## 2019-10-30 MED ORDER — NEOMYCIN SULFATE 500 MG PO TABS
1000.0000 mg | ORAL_TABLET | ORAL | Status: AC
  Administered 2019-10-30 (×3): 1000 mg via ORAL
  Filled 2019-10-30 (×3): qty 2

## 2019-10-30 MED ORDER — LIP MEDEX EX OINT
1.0000 "application " | TOPICAL_OINTMENT | CUTANEOUS | Status: DC | PRN
  Filled 2019-10-30: qty 7

## 2019-10-30 MED ORDER — ALVIMOPAN 12 MG PO CAPS
12.0000 mg | ORAL_CAPSULE | ORAL | Status: AC
  Administered 2019-10-31: 12 mg via ORAL
  Filled 2019-10-30: qty 1

## 2019-10-30 MED ORDER — ACETAMINOPHEN 500 MG PO TABS
1000.0000 mg | ORAL_TABLET | ORAL | Status: AC
  Administered 2019-10-31: 1000 mg via ORAL
  Filled 2019-10-30: qty 2

## 2019-10-30 MED ORDER — ENOXAPARIN SODIUM 40 MG/0.4ML ~~LOC~~ SOLN
40.0000 mg | SUBCUTANEOUS | Status: DC
  Filled 2019-10-30: qty 0.4

## 2019-10-30 NOTE — Progress Notes (Signed)
PROGRESS NOTE  Wesley [name]  [id number] DOB: [birth year] DOA: 10/28/2019 PCP: Wesley Pretty, MD   Brief Narrative: Wesley [name] is a 79 y.o. male with a history of CAD, HTN, HLD, OSA, BPH, GERD, and a couple months of abdominal pain, poor per oral intake, fatigue culminating in several days of constipation and abdominal distention. In the ED, a CT of the abdomen demonstrated distal partial obstruction with possible mass. Ht was admitted for colonoscopy 3/17.  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 - Continue IV fluids pending diet recommendations. - Follow up pathology, CEA  CAD, HLD: No chest pain.  - Continue statin, holding ASA for now  HTN:  - Cotninue lisinopril  BPH:  - Continue finasteride  GERD:  - Continue PPI  DVT prophylaxis: Heparin 5,000 units q8h Code Status: Full Family Communication: None at bedside Disposition Plan: Plan to return home depending on further need for procedures. Planning surgery 3/19.  Consultants:   GI  Surgery  Procedures:   Flexible sigmoidoscopy 3/17:  Findings:      The digital rectal exam was normal.      Extensive amounts of semi-liquid semi-solid stool was found in the       rectum and in the sigmoid colon, making visualization difficult. Copious       irrigation and lavage performed.      Many small-mouthed diverticula were found in the sigmoid colon.      There is a stricture at approximately 30 cm from the dentate line. This       is in a segment of severe diverticulosis and there is a polypoid     partially obstructing mass at this location. Given the stricture and       poor preparation due to partial obstruction, the mass is difficult to       fully characterized, but appears to be partially circumferential. The       mass and stricture is rather short segment, approximately 3-4 cm in       length. No bleeding was present. Biopsies were taken on the mass and at       the stricture with a cold forceps for histology. Area well distal to       this mass was tattooed with 2 injections on opposite walls totally 4 mL       of Spot (carbon black). Of note colon above the stricture is dilated but       appears very viable though views limited by stool which could not be       cleared. Impression:       - Stool in the rectum and in the sigmoid colon.                            Visualization limited.                           - Diverticulosis in the sigmoid colon.                           -  Rule out malignancy, partially obstructing tumor                            causing narrowing as above in the sigmoid colon.                            Biopsied. Tattooed.  Antimicrobials:  None   Subjective: No flatus or BM today, unable to continue with gentle prep. Getting up to bathroom, otherwise staying in bed, normal UOP.  Objective: Vitals:   10/29/19 1906 10/29/19 2237 10/30/19 0648 10/30/19 1344  BP:  (!) 140/102 (!) 153/105 (!) 152/101  Pulse: 97 99 94 92  Resp: 16 18 17 16   Temp:  98.2 F (36.8 C) 97.6 F (36.4 C) 97.7 F (36.5 C)  TempSrc:  Oral Oral Oral  SpO2: 94% 94% 95% 92%  Weight:      Height:        Intake/Output Summary (Last 24 hours) at 10/30/2019 1513 Last data filed at 10/30/2019 0900 Gross per 24 hour  Intake 1300 ml  Output --  Net 1300 ml   Filed Weights   10/29/19 1104  Weight: 81.6 kg   Gen: 79 y.o. male in no distress Pulm: Nonlabored breathing room air. Clear. CV: Regular rate and rhythm. No murmur, rub, or gallop. No JVD, no dependent  edema. GI: Abdomen soft, minimally tender, distended, with hypoactive bowel sounds.  Ext: Warm, no deformities Skin: No rashes, lesions or ulcers on visualized skin. Neuro: Alert and oriented. No focal neurological deficits. Psych: Judgement and insight appear fair. Mood euthymic & affect congruent. Behavior is appropriate.    Data Reviewed: I have personally reviewed following labs and imaging studies  CBC: Recent Labs  Lab 10/28/19 1552 10/29/19 0507  WBC 8.6 9.9  NEUTROABS 6.8  --   HGB 14.4 16.8  HCT 42.9 50.8  MCV 98.8 97.7  PLT 200 [id number]   Basic Metabolic Panel: Recent Labs  Lab 10/28/19 1552 10/29/19 0507  NA 139 136  K 3.0* 4.6  CL 107 99  CO2 21* 26  GLUCOSE 110* 143*  BUN 14 13  CREATININE 0.57* 0.86  CALCIUM 7.7* 9.3   GFR: Estimated Creatinine Clearance: 68.5 mL/min (by C-G formula based on SCr of 0.86 mg/dL). Liver Function Tests: Recent Labs  Lab 10/28/19 1552 10/29/19 0507  AST 18 19  ALT 15 15  ALKPHOS 72 80  BILITOT 0.9 0.6  PROT 6.2* 7.4  ALBUMIN 3.4* 3.9   Recent Labs  Lab 10/28/19 1552  LIPASE 20   No results for input(s): AMMONIA in the last 168 hours. Coagulation Profile: No results for input(s): INR, PROTIME in the last 168 hours. Cardiac Enzymes: No results for input(s): CKTOTAL, CKMB, CKMBINDEX, TROPONINI in the last 168 hours. BNP (last 3 results) No results for input(s): PROBNP in the last 8760 hours. HbA1C: Recent Labs    10/30/19 0826  HGBA1C 5.6   CBG: No results for input(s): GLUCAP in the last 168 hours. Lipid Profile: No results for input(s): CHOL, HDL, LDLCALC, TRIG, CHOLHDL, LDLDIRECT in the last 72 hours. Thyroid Function Tests: No results for input(s): TSH, T4TOTAL, FREET4, T3FREE, THYROIDAB in the last 72 hours. Anemia Panel: No results for input(s): VITAMINB12, FOLATE, FERRITIN, TIBC, IRON, RETICCTPCT in the last 72 hours. Urine analysis:    Component Value Date/Time   COLORURINE YELLOW 10/28/2019 1800    APPEARANCEUR CLEAR 10/28/2019 1800  LABSPEC 1.034 (H) 10/28/2019 1800   PHURINE 5.0 10/28/2019 1800   GLUCOSEU NEGATIVE 10/28/2019 1800   HGBUR SMALL (A) 10/28/2019 1800   BILIRUBINUR NEGATIVE 10/28/2019 1800   KETONESUR 20 (A) 10/28/2019 1800   PROTEINUR NEGATIVE 10/28/2019 1800   NITRITE NEGATIVE 10/28/2019 1800   LEUKOCYTESUR NEGATIVE 10/28/2019 1800   Recent Results (from the past 240 hour(s))  Respiratory Panel by RT PCR (Flu A&B, Covid) - Nasopharyngeal Swab     Status: None   Collection Time: 10/28/19  7:17 PM   Specimen: Nasopharyngeal Swab  Result Value Ref Range Status   SARS Coronavirus 2 by RT PCR NEGATIVE NEGATIVE Final    Comment: (NOTE) SARS-CoV-2 target nucleic acids are NOT DETECTED. The SARS-CoV-2 RNA is generally detectable in upper respiratoy specimens during the acute phase of infection. The lowest concentration of SARS-CoV-2 viral copies this assay can detect is 131 copies/mL. A negative result does not preclude SARS-Cov-2 infection and should not be used as the sole basis for treatment or other patient management decisions. A negative result may occur with  improper specimen collection/handling, submission of specimen other than nasopharyngeal swab, presence of viral mutation(s) within the areas targeted by this assay, and inadequate number of viral copies (<131 copies/mL). A negative result must be combined with clinical observations, patient history, and epidemiological information. The expected result is Negative. Fact Sheet for Patients:  PinkCheek.be Fact Sheet for Healthcare Providers:  GravelBags.it This test is not yet ap proved or cleared by the [place] FDA and  has been authorized for detection and/or diagnosis of SARS-CoV-2 by FDA under an Emergency Use Authorization (EUA). This EUA will remain  in effect (meaning this test can be used) for the duration of the COVID-19  declaration under Section 564(b)(1) of the Act, 21 U.S.C. section 360bbb-3(b)(1), unless the authorization is terminated or revoked sooner.    Influenza A by PCR NEGATIVE NEGATIVE Final   Influenza B by PCR NEGATIVE NEGATIVE Final    Comment: (NOTE) The Xpert Xpress SARS-CoV-2/FLU/RSV assay is intended as an aid in  the diagnosis of influenza from Nasopharyngeal swab specimens and  should not be used as a sole basis for treatment. Nasal washings and  aspirates are unacceptable for Xpert Xpress SARS-CoV-2/FLU/RSV  testing. Fact Sheet for Patients: PinkCheek.be Fact Sheet for Healthcare Providers: GravelBags.it This test is not yet approved or cleared by the [place] FDA and  has been authorized for detection and/or diagnosis of SARS-CoV-2 by  FDA under an Emergency Use Authorization (EUA). This EUA will remain  in effect (meaning this test can be used) for the duration of the  Covid-19 declaration under Section 564(b)(1) of the Act, 21  U.S.C. section 360bbb-3(b)(1), unless the authorization is  terminated or revoked. Performed at [facility], [place] [address] [place], [place] [address]       Radiology Studies: CT Abdomen Pelvis W Contrast  Result Date: 10/28/2019 CLINICAL DATA:  Suspect diverticulitis. Acute abdominal pain the past 3 days. EXAM: CT ABDOMEN AND PELVIS WITH CONTRAST TECHNIQUE: Multidetector CT imaging of the abdomen and pelvis was performed using the standard protocol following bolus administration of intravenous contrast. Automatic exposure control utilized. CONTRAST:  OMNIPAQUE IOHEXOL 350 MG/ML SOLN, 175mL OMNIPAQUE IOHEXOL 300 MG/ML SOLN COMPARISON:  August 12, 2013 FINDINGS: Lower chest: Normal heart size. Right main coronary calcification. Small paraesophageal hernia. Calcified atherosclerosis of the thoracic aorta. Mild subpleural atelectasis. No dependent pleural effusion.  Hepatobiliary: Several small gallstones within the dependent gallbladder lumen. No  gallbladder wall thickening or dilatation. No calcified stone within the cystic or common bile duct. Mildly dilated 7 mm caliber of the proximal common bile duct with normal distal tapering. Fatty infiltration of the liver. Normal hepatic size and contours without ascites. Patent portal veins. Pancreas: Mild atrophy. No acute pancreatitis. Spleen: Normal. Adrenals/Urinary Tract: Normal right and left adrenal glands. Symmetrical moderate bilateral perinephric inflammatory change, likely medical renal disease. Normal bilateral renal cortical thickness. A 4.5 cm 8 Hounsfield unit simple appearing right renal lower pole exophytic cyst and smaller simple appearing bilateral cortical cysts. Nonobstructing right micro nephrolithiasis. No hydronephrosis or ureteral calculus bilaterally. Stomach/Bowel: Normal appendix, coronal image 39. Moderate gaseous distension throughout the colon with air-fluid levels to the level of the mid sigmoid colon where stool or soft tissue mucosal thickening is present in the bowel lumen. Decompressed distal sigmoid colon. Small amount of steatotic stool in the rectum. Mild diverticulosis of the distal sigmoid and descending colon. Decompressed small bowel and gastric lumen without an apparent mucosal abnormality. 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 Aortic calcified atherosclerosis without an abdominal aortic aneurysmal dilatation. Reproductive: Mild prostatomegaly. Other: Small bilateral inguinal herniations of fat without strangulation or incarceration. Musculoskeletal: Diffuse mild bone demineralization. Moderate skeletal degenerative change. Grade 1 anterolisthesis of L4 on L5 and grade 1 retrolisthesis of L5 on S1 and T12 on L1, likely degenerative. Pseudoarticulation  of the L4 and L5 spinous processes. Severe osseous narrowing of the left L5-S1 neural foramen. Probable benign bone islands in the bilateral femur. These results will be called to the ordering clinician or representative by the Radiologist Assistant, and communication documented in the PACS or [employer]. IMPRESSION: Partial distal colonic obstruction with moderate gaseous distension of the colon with air-fluid levels to the level of the mid sigmoid colon where there is either stool, colo-colonic intussusception, and/or abnormal and possibly malignant soft tissue mucosal mass. Correlation with colonoscopy recommended. No acute diverticulitis. Uncomplicated cholelithiasis. 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 Small paraesophageal hernia. Fatty infiltration of the liver. Mild prostatomegaly. Severe osseous narrowing of the left L5-S1 neural foramen, potentially impinging on the exiting left L5 nerve root. Aortic Atherosclerosis (ICD10-I70.0). Electronically Signed   By: Revonda Humphrey   On: 10/28/2019 17:27    Scheduled Meds: . Derrill Memo ON 10/31/2019] acetaminophen  1,000 mg Oral On Call to OR  . [START ON 10/31/2019] alvimopan  12 mg Oral On Call to OR  . atorvastatin  80 mg Oral q1800  . Chlorhexidine Gluconate Cloth  6 each Topical Once  . [START ON 10/31/2019] enoxaparin (LOVENOX) injection  40 mg Subcutaneous On Call to OR  . finasteride  5 mg Oral Daily  . [START ON 10/31/2019] gabapentin  300 mg Oral On Call to OR  . heparin  5,000 Units Subcutaneous Q8H  . lisinopril  10 mg Oral Daily  . loratadine  10 mg Oral Daily  . neomycin  1,000 mg Oral 3 times per day   And  . metroNIDAZOLE  1,000 mg Oral 3 times per day  . vitamin B-12  1,000 mcg Oral Daily   Continuous Infusions: . sodium chloride    . [START ON 10/31/2019] cefoTEtan (CEFOTAN) IV    . dextrose 5 % and 0.45% NaCl 100 mL/hr at 10/29/19  1628     LOS: 2 days   Time spent: 25 minutes.  Patrecia Pour, MD [facility] [email or web address] 10/30/2019, 3:13 PM

## 2019-10-30 NOTE — Consult Note (Addendum)
Consultation: Colon mass for cystoscopy and bilateral ureteral stents prior to lap assisted vs open sigmoid colectomy and colostomy Requested by: Erroll Luna, MD   History of Present Illness: Wesley [name] is a 79 year old male with a history of abdominal pain.  CT scan of the abdomen and pelvis reveals a partial colon obstruction with possible mass.  He has failed advancement in his diet and general surgery will take tomorrow for colectomy and colostomy.  Cystoscopy with ureteral stent placement requested.  Pt follows with Dr. Jeffie Pollock for BPH and ED. He had a negative biopsy for a rising PSA and induration on 03/20/08. He remains on finasteride and his PSA was up to 2.28 on 09/14/17. It was 1.8 in 7/19 and 2.02 on 09/19/18 and surveillance was continued. His PSA nadired at 1.2 after starting finasteride. His PSA was 3.2 prior to the finasteride. He is voiding well with the finasteride. His IPSS was 6. He had nocturia x 1 and had a good stream. Currently, voiding adequately. No hematuria or dysuria.    Past Medical History:  Diagnosis Date  . 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 Chronic allergic rhinitis   . Coronary atherosclerosis of native coronary artery   . GERD (gastroesophageal reflux disease)   . High blood pressure   . Hypercholesterolemia   . Macrocytosis without anemia   . OSA on CPAP   . 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 Sleep apnea   . Umbilical hernia without obstruction or gangrene    Past Surgical History:  Procedure Laterality Date  . BIOPSY  10/29/2019   Procedure: BIOPSY;  Surgeon: Jerene Bears, MD;  Location: [facility];  Service: Gastroenterology;;  . CARDIAC CATHETERIZATION  2000   Cath to rule out cardiac problems RT HTN. PT denies significant findings  . CARDIAC CATHETERIZATION  2008  . EYE SURGERY     BILATERAL CATARACT SURGERY WITH LENS IMPLANTS  . FLEXIBLE SIGMOIDOSCOPY N/A 10/29/2019   Procedure: FLEXIBLE  SIGMOIDOSCOPY;  Surgeon: Jerene Bears, MD;  Location: [facility];  Service: Gastroenterology;  Laterality: N/A;  . INSERTION OF MESH N/A 09/08/2016   Procedure: INSERTION OF MESH;  Surgeon: Jackolyn Confer, MD;  Location: [facility];  Service: General;  Laterality: N/A;  . right rotator cuff    . ROTATOR CUFF REPAIR    . SUBMUCOSAL TATTOO INJECTION  10/29/2019   Procedure: SUBMUCOSAL TATTOO INJECTION;  Surgeon: Jerene Bears, MD;  Location: [facility];  Service: Gastroenterology;;  . TONSILLECTOMY    . UMBILICAL HERNIA REPAIR N/A 09/08/2016   Procedure: OPEN UMBILICAL HERNIA REPAIR WITH MESH;  Surgeon: Jackolyn Confer, MD;  Location: [facility];  Service: General;  Laterality: N/A;    Home Medications:  Medications Prior to Admission  Medication Sig Dispense Refill Last Dose  . atorvastatin (LIPITOR) 80 MG tablet TAKE 1 TABLET EVERY DAY  AT  6  PM (Patient taking differently: Take 80 mg by mouth daily at 6 PM. ) 90 tablet 3 10/27/2019 at Unknown time  . cetirizine (ZYRTEC) 10 MG tablet Take 10 mg by mouth daily. ALLERTEC  AT 2 AM DAILY   10/28/2019 at Unknown time  . Chromium Picolinate 800 MCG TABS Take 1 tablet by mouth daily.   10/27/2019 at Unknown time  . finasteride (PROSCAR) 5 MG tablet Take 5 mg by mouth every morning. AT 2 AM DAILY   10/28/2019 at Unknown time  . fluticasone (FLONASE) 50 MCG/ACT nasal spray  Place 2 sprays into both nostrils daily as needed for allergies or rhinitis.   Past Month at Unknown time  . ibuprofen (ADVIL,MOTRIN) 200 MG tablet Take 200 mg by mouth every 6 (six) hours as needed.   10/27/2019 at Unknown time  . lisinopril (PRINIVIL,ZESTRIL) 10 MG tablet Take 10 mg by mouth every morning. AT 2 AM DAILY   10/28/2019 at Unknown time  . Magnesium 400 MG TABS Take 1 tablet by mouth daily as needed.   10/27/2019 at Unknown time  . Omega-3 Fatty Acids (FISH OIL) 1200 MG CAPS Take 1,200 mg by mouth daily. IN AFTERNOON AFTER LUNCH   10/27/2019 at Unknown time  . vitamin B-12  (CYANOCOBALAMIN) 1000 MCG tablet Take 1,000 mcg by mouth daily. AT 2 AM DAILY   10/28/2019 at Unknown time  . aspirin EC 81 MG tablet Take 1 tablet (81 mg total) by mouth every other day. (Patient not taking: Reported on 10/28/2019)   Not Taking at Unknown time   Allergies:  Allergies  Allergen Reactions  . Demerol Other (See Comments)    Causes dizziness  . Promethazine Hcl Other (See Comments)    Dizziness     Family History  Problem Relation Age of Onset  . Diabetes Mother   . CAD Father   . Melanoma Brother 69  . Healthy Daughter   . Healthy Son    Social History:  reports that he has never smoked. He has never used smokeless tobacco. He reports current alcohol use of about 14.0 standard drinks of alcohol per week. He reports that he does not use drugs.  ROS: A complete review of systems was performed.  All systems are negative except for pertinent findings as noted. Review of Systems  Gastrointestinal: Positive for abdominal pain.  All other systems reviewed and are negative.    Physical Exam:  Vital signs in last 24 hours: Temp:  [97.6 F (36.4 C)-98.2 F (36.8 C)] 97.7 F (36.5 C) (03/18 1344) Pulse Rate:  [92-99] 92 (03/18 1344) Resp:  [16-18] 16 (03/18 1344) BP: (140-153)/(101-105) 152/101 (03/18 1344) SpO2:  [92 %-95 %] 92 % (03/18 1344) General:  Alert and oriented, No acute distress HEENT: Normocephalic, atraumatic Neck: No JVD or lymphadenopathy Cardiovascular: Regular rate and rhythm Lungs: Regular rate and effort Abdomen: Soft, nontender, nondistended, no abdominal masses Back: No CVA tenderness Extremities: No edema Neurologic: Grossly intact  Laboratory Data:  Results for orders placed or performed during the hospital encounter of 10/28/19 (from the past 24 hour(s))  Hemoglobin A1c     Status: None   Collection Time: 10/30/19  8:26 AM  Result Value Ref Range   Hgb A1c MFr Bld 5.6 4.8 - 5.6 %   Mean Plasma Glucose 114.02 mg/dL   Recent Results  (from the past 240 hour(s))  Respiratory Panel by RT PCR (Flu A&B, Covid) - Nasopharyngeal Swab     Status: None   Collection Time: 10/28/19  7:17 PM   Specimen: Nasopharyngeal Swab  Result Value Ref Range Status   SARS Coronavirus 2 by RT PCR NEGATIVE NEGATIVE Final    Comment: (NOTE) SARS-CoV-2 target nucleic acids are NOT DETECTED. The SARS-CoV-2 RNA is generally detectable in upper respiratoy specimens during the acute phase of infection. The lowest concentration of SARS-CoV-2 viral copies this assay can detect is 131 copies/mL. A negative result does not preclude SARS-Cov-2 infection and should not be used as the sole basis for treatment or other patient management decisions. A negative result may occur  with  improper specimen collection/handling, submission of specimen other than nasopharyngeal swab, presence of viral mutation(s) within the areas targeted by this assay, and inadequate number of viral copies (<131 copies/mL). A negative result must be combined with clinical observations, patient history, and epidemiological information. The expected result is Negative. Fact Sheet for Patients:  PinkCheek.be Fact Sheet for Healthcare Providers:  GravelBags.it This test is not yet ap proved or cleared by the [place] FDA and  has been authorized for detection and/or diagnosis of SARS-CoV-2 by FDA under an Emergency Use Authorization (EUA). This EUA will remain  in effect (meaning this test can be used) for the duration of the COVID-19 declaration under Section 564(b)(1) of the Act, 21 U.S.C. section 360bbb-3(b)(1), unless the authorization is terminated or revoked sooner.    Influenza A by PCR NEGATIVE NEGATIVE Final   Influenza B by PCR NEGATIVE NEGATIVE Final    Comment: (NOTE) The Xpert Xpress SARS-CoV-2/FLU/RSV assay is intended as an aid in  the diagnosis of influenza from Nasopharyngeal swab specimens and   should not be used as a sole basis for treatment. Nasal washings and  aspirates are unacceptable for Xpert Xpress SARS-CoV-2/FLU/RSV  testing. Fact Sheet for Patients: PinkCheek.be Fact Sheet for Healthcare Providers: GravelBags.it This test is not yet approved or cleared by the [place] FDA and  has been authorized for detection and/or diagnosis of SARS-CoV-2 by  FDA under an Emergency Use Authorization (EUA). This EUA will remain  in effect (meaning this test can be used) for the duration of the  Covid-19 declaration under Section 564(b)(1) of the Act, 21  U.S.C. section 360bbb-3(b)(1), unless the authorization is  terminated or revoked. Performed at [facility], [place] [address] [place], [place] [address]    Creatinine: Recent Labs    10/28/19 1552 10/29/19 0507  CREATININE 0.57* 0.86    Impression/Assessment/plan:  I discussed with the patient and his wife the nature, potential benefits, risks and alternatives to cystoscopy, bilateral retrograde pyelogram and bilateral ureteral stent placement, including side effects of the proposed treatment, the likelihood of the patient achieving the goals of the procedure, and any potential problems that might occur during the procedure or recuperation. All questions answered. Patient elects to proceed.    Wesley [name] 10/30/2019, 4:58 PM

## 2019-10-30 NOTE — Consult Note (Addendum)
[place] Nurse requested for preoperative stoma site marking  Discussed surgical procedure and stoma creation with patient.  Explained role of the [facility] nurse team.  Provided the patient with educational booklet and provided samples of pouching options.  Answered patient's questions.   Examined patient lying and sitting upright in order to place the marking in the patient's visual field, away from any creases or abdominal contour issues and within the rectus muscle.  Attempted to mark below the patient's belt line, but this was not possible, since patient has a significant crease which appears when he leans forward and should be avoided if possible.  Pt's abd is very tight and distended and may have other creases which appear later when the pressure in the abd is reduced, but they are not visible at this time.   Marked for colostomy in the LLQ  __5__ cm to the left of the umbilicus and [id number] above the umbilicus.  Marked for ileostomy in the RLQ  __5__cm to the right of the umbilicus and  [id number] cm above the umbilicus.  Patient's abdomen cleansed with CHG wipes at site markings, allowed to air dry prior to marking. Pt plans for surgery tomorrow.  [facility] Nurse team will follow up with patient after surgery for continued ostomy care and teaching after surgery if he receives an ostomy.  Thank-you,  Julien Girt MSN, [place], [place], [place], [place]

## 2019-10-30 NOTE — Progress Notes (Signed)
Progress Note   Subjective  Chief Complaint: Colon mass with partial large bowel obstruction; status post colonoscopy 10/29/2019 biopsies pending  Patient reports that he has not had any gas in the past 12 hours that he can recall, has not had a bowel movement since enemas yesterday.  Overall is feeling okay, glad that we are going to we will do surgery while he is here and quickly.   Objective   Vital signs in last 24 hours: Temp:  [97.5 F (36.4 C)-98.2 F (36.8 C)] 97.6 F (36.4 C) (03/18 0648) Pulse Rate:  [86-99] 94 (03/18 0648) Resp:  [12-18] 17 (03/18 0648) BP: (114-167)/(73-110) 153/105 (03/18 0648) SpO2:  [92 %-100 %] 95 % (03/18 0648) Last BM Date: 10/29/19 General:    white elderly male in NAD Heart:  Regular rate and rhythm; no murmurs Lungs: Respirations even and unlabored, lungs CTA bilaterally Abdomen:  Soft, mild generalized TTP and marked distention.  Decreased bowel sounds all 4 quadrants Extremities:  Without edema. Neurologic:  Alert and oriented,  grossly normal neurologically. Psych:  Cooperative. Normal mood and affect.  Intake/Output from previous day: 03/17 0701 - 03/18 0700 In: 1893.7 [P.O.:360; I.V.:1533.7] Out: -  Intake/Output this shift: Total I/O In: 240 [P.O.:240] Out: -   Lab Results: Recent Labs    10/28/19 1552 10/29/19 0507  WBC 8.6 9.9  HGB 14.4 16.8  HCT 42.9 50.8  PLT 200 275   BMET Recent Labs    10/28/19 1552 10/29/19 0507  NA 139 136  K 3.0* 4.6  CL 107 99  CO2 21* 26  GLUCOSE 110* 143*  BUN 14 13  CREATININE 0.57* 0.86  CALCIUM 7.7* 9.3   LFT Recent Labs    10/29/19 0507  PROT 7.4  ALBUMIN 3.9  AST 19  ALT 15  ALKPHOS 80  BILITOT 0.6   Studies/Results: CT Abdomen Pelvis W Contrast  Result Date: 10/28/2019 CLINICAL DATA:  Suspect diverticulitis. Acute abdominal pain the past 3 days. EXAM: CT ABDOMEN AND PELVIS WITH CONTRAST TECHNIQUE: Multidetector CT imaging of the abdomen and pelvis was performed  using the standard protocol following bolus administration of intravenous contrast. Automatic exposure control utilized. CONTRAST:  OMNIPAQUE IOHEXOL 350 MG/ML SOLN, 148mL OMNIPAQUE IOHEXOL 300 MG/ML SOLN COMPARISON:  August 12, 2013 FINDINGS: Lower chest: Normal heart size. Right main coronary calcification. Small paraesophageal hernia. Calcified atherosclerosis of the thoracic aorta. Mild subpleural atelectasis. No dependent pleural effusion. Hepatobiliary: Several small gallstones within the dependent gallbladder lumen. No gallbladder wall thickening or dilatation. No calcified stone within the cystic or common bile duct. Mildly dilated 7 mm caliber of the proximal common bile duct with normal distal tapering. Fatty infiltration of the liver. Normal hepatic size and contours without ascites. Patent portal veins. Pancreas: Mild atrophy. No acute pancreatitis. Spleen: Normal. Adrenals/Urinary Tract: Normal right and left adrenal glands. Symmetrical moderate bilateral perinephric inflammatory change, likely medical renal disease. Normal bilateral renal cortical thickness. A 4.5 cm 8 Hounsfield unit simple appearing right renal lower pole exophytic cyst and smaller simple appearing bilateral cortical cysts. Nonobstructing right micro nephrolithiasis. No hydronephrosis or ureteral calculus bilaterally. Stomach/Bowel: Normal appendix, coronal image 39. Moderate gaseous distension throughout the colon with air-fluid levels to the level of the mid sigmoid colon where stool or soft tissue mucosal thickening is present in the bowel lumen. Decompressed distal sigmoid colon. Small amount of steatotic stool in the rectum. Mild diverticulosis of the distal sigmoid and descending colon. Decompressed small bowel and gastric lumen without  an apparent mucosal abnormality. 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 Aortic calcified atherosclerosis without an abdominal aortic aneurysmal dilatation. Reproductive: Mild prostatomegaly. Other: Small bilateral inguinal herniations of fat without strangulation or incarceration. Musculoskeletal: Diffuse mild bone demineralization. Moderate skeletal degenerative change. Grade 1 anterolisthesis of L4 on L5 and grade 1 retrolisthesis of L5 on S1 and T12 on L1, likely degenerative. Pseudoarticulation of the L4 and L5 spinous processes. Severe osseous narrowing of the left L5-S1 neural foramen. Probable benign bone islands in the bilateral femur. These results will be called to the ordering clinician or representative by the Radiologist Assistant, and communication documented in the PACS or [employer]. IMPRESSION: Partial distal colonic obstruction with moderate gaseous distension of the colon with air-fluid levels to the level of the mid sigmoid colon where there is either stool, colo-colonic intussusception, and/or abnormal and possibly malignant soft tissue mucosal mass. Correlation with colonoscopy recommended. No acute diverticulitis. Uncomplicated cholelithiasis. 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 Small paraesophageal hernia. Fatty infiltration of the liver. Mild prostatomegaly. Severe osseous narrowing of the left L5-S1 neural foramen, potentially impinging on the exiting left L5 nerve root. Aortic Atherosclerosis (ICD10-I70.0). Electronically Signed   By: Revonda Humphrey   On: 10/28/2019 17:27   Colonoscopy 10/29/2019 Findings:      The digital rectal exam was normal.      Extensive amounts of semi-liquid semi-solid stool was found in the       rectum and in the sigmoid colon, making visualization difficult. Copious       irrigation and lavage performed.      Many small-mouthed diverticula were found in the sigmoid colon.      There is a  stricture at approximately 30 cm from the dentate line. This       is in a segment of severe diverticulosis and there is a polypoid       partially obstructing mass at this location. Given the stricture and       poor preparation due to partial obstruction, the mass is difficult to       fully characterized, but appears to be partially circumferential. The       mass and stricture is rather short segment, approximately 3-4 cm in       length. No bleeding was present. Biopsies were taken on the mass and at       the stricture with a cold forceps for histology. Area well distal to       this mass was tattooed with 2 injections on opposite walls totally 4 mL       of Spot (carbon black). Of note colon above the stricture is dilated but       appears very viable though views limited by stool which could not be       cleared. Impression:               - Stool in the rectum and in the sigmoid colon.                            Visualization limited.                           - Diverticulosis in the sigmoid colon.                           -  Rule out malignancy, partially obstructing tumor                            causing narrowing as above in the sigmoid colon.                            Biopsied. Tattooed. Moderate Sedation:      Moderate (conscious) sedation was administered by the endoscopy nurse       and supervised by the endoscopist. The patient's oxygen saturation,       heart rate, blood pressure and response to care were monitored. Total       physician intraservice time was 34 minutes. Recommendation:           - Return patient to hospital ward for ongoing care.                           - Clear liquid diet.                           - Await pathology results.                           - Gentle bowel preparation as tolerated slowly PO.                           - Surgical consultation.    Assessment / Plan:    Assessment: 1.  Colon mass with partial large bowel obstruction:  Colonoscopy 10/29/2019, biopsies pending, surgery plans on partial bowel resection tomorrow 2.  Increasing abdominal distention, constipation: Related to above  Plan: 1.  Appreciate surgical recommendations. 2.  We will likely sign off.  Please await any final recommendations from Dr. Hilarie Fredrickson later today  Thank you for your kind consultation.   LOS: 2 days   Levin Erp  10/30/2019, 11:17 AM

## 2019-10-30 NOTE — Progress Notes (Signed)
1 Day Post-Op    CC: abdominal pain, nausea and vomiting x 3 days  Subjective: I talked with the patient and his wife on the phone together.  He is pretty uncomfortable with the liquids.  He says it just makes his abdomen hurt more.  He is quite distended, although he is having some bowel movements.  We discussed the plans for today, including a urology consult and wound ostomy to mark his wound later today.  I told him that the pathology report and CEA are still pending.  Objective: Vital signs in last 24 hours: Temp:  [97.5 F (36.4 C)-99 F (37.2 C)] 97.6 F (36.4 C) (03/18 0648) Pulse Rate:  [86-99] 94 (03/18 0648) Resp:  [12-20] 17 (03/18 0648) BP: (114-183)/(73-127) 153/105 (03/18 0648) SpO2:  [92 %-100 %] 95 % (03/18 0648) Weight:  [81.6 kg] 81.6 kg (03/17 1104) Last BM Date: 10/29/19 360 PO 1533 IV Urine x 2 BM x 4 Afebrile, BP elevated CEA pending  Intake/Output from previous day: 03/17 0701 - 03/18 0700 In: 1893.7 [P.O.:360; I.V.:1533.7] Out: -  Intake/Output this shift: No intake/output data recorded.  General appearance: alert, cooperative, no distress and He is quite uncomfortable with his abdominal distention. Resp: clear to auscultation bilaterally GI: Very distended and uncomfortable.  Bowel sounds are high-pitched.  No peritonitis.  He is having some bowel movements  Lab Results:  Recent Labs    10/28/19 1552 10/29/19 0507  WBC 8.6 9.9  HGB 14.4 16.8  HCT 42.9 50.8  PLT 200 275    BMET Recent Labs    10/28/19 1552 10/29/19 0507  NA 139 136  K 3.0* 4.6  CL 107 99  CO2 21* 26  GLUCOSE 110* 143*  BUN 14 13  CREATININE 0.57* 0.86  CALCIUM 7.7* 9.3   PT/INR No results for input(s): LABPROT, INR in the last 72 hours.  Recent Labs  Lab 10/28/19 1552 10/29/19 0507  AST 18 19  ALT 15 15  ALKPHOS 72 80  BILITOT 0.9 0.6  PROT 6.2* 7.4  ALBUMIN 3.4* 3.9     Lipase     Component Value Date/Time   LIPASE 20 10/28/2019 1552      Medications: . atorvastatin  80 mg Oral q1800  . heparin  5,000 Units Subcutaneous Q8H  . lisinopril  10 mg Oral Daily  . loratadine  10 mg Oral Daily  . 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  Urology consult for stent placement, wound care to mark for ostomy placement if that is needed.  Await pathology and CEA results.   LOS: 2 days    Terre Zabriskie 10/30/2019 Please see Amion

## 2019-10-31 ENCOUNTER — Encounter (HOSPITAL_COMMUNITY)
Admission: EM | Disposition: A | Discharge status: Home Health | Payer: Self-pay | Source: Home / Self Care | Attending: Family Medicine

## 2019-10-31 ENCOUNTER — Encounter (HOSPITAL_COMMUNITY): Payer: Self-pay | Admitting: Internal Medicine

## 2019-10-31 ENCOUNTER — Inpatient Hospital Stay (HOSPITAL_COMMUNITY): Payer: Medicare Other | Admitting: Certified Registered"

## 2019-10-31 DIAGNOSIS — K56609 Unspecified intestinal obstruction, unspecified as to partial versus complete obstruction: Secondary | ICD-10-CM | POA: Diagnosis not present

## 2019-10-31 DIAGNOSIS — I2510 Atherosclerotic heart disease of native coronary artery without angina pectoris: Secondary | ICD-10-CM | POA: Diagnosis not present

## 2019-10-31 DIAGNOSIS — K219 Gastro-esophageal reflux disease without esophagitis: Secondary | ICD-10-CM | POA: Diagnosis not present

## 2019-10-31 DIAGNOSIS — I10 Essential (primary) hypertension: Secondary | ICD-10-CM | POA: Diagnosis not present

## 2019-10-31 HISTORY — PX: LAPAROSCOPIC SIGMOID COLECTOMY: SHX5928

## 2019-10-31 HISTORY — PX: CYSTOSCOPY W/ URETERAL STENT PLACEMENT: SHX1429

## 2019-10-31 HISTORY — PX: COLOSTOMY: SHX63

## 2019-10-31 LAB — CEA: CEA: 4.9 ng/mL — ABNORMAL HIGH (ref 0.0–4.7)

## 2019-10-31 LAB — SURGICAL PCR SCREEN
MRSA, PCR: NEGATIVE
Staphylococcus aureus: NEGATIVE

## 2019-10-31 SURGERY — COLECTOMY, SIGMOID, LAPAROSCOPIC
Anesthesia: General | Site: Ureter

## 2019-10-31 MED ORDER — BUPIVACAINE HCL 0.25 % IJ SOLN
INTRAMUSCULAR | Status: AC
  Filled 2019-10-31: qty 1

## 2019-10-31 MED ORDER — LIDOCAINE 2% (20 MG/ML) 5 ML SYRINGE
INTRAMUSCULAR | Status: AC
  Filled 2019-10-31: qty 5

## 2019-10-31 MED ORDER — DIPHENHYDRAMINE HCL 50 MG/ML IJ SOLN: 12.5000 mg | Freq: Four times a day (QID) | INTRAMUSCULAR | Status: DC | PRN

## 2019-10-31 MED ORDER — ONDANSETRON HCL 4 MG/2ML IJ SOLN
INTRAMUSCULAR | Status: AC
  Filled 2019-10-31: qty 2

## 2019-10-31 MED ORDER — LABETALOL HCL 5 MG/ML IV SOLN
INTRAVENOUS | Status: DC | PRN
  Administered 2019-10-31 (×3): 5 mg via INTRAVENOUS

## 2019-10-31 MED ORDER — MORPHINE SULFATE (PF) 2 MG/ML IV SOLN
2.0000 mg | INTRAVENOUS | Status: DC | PRN
  Administered 2019-10-31: 2 mg via INTRAVENOUS
  Administered 2019-10-31 – 2019-11-03 (×7): 4 mg via INTRAVENOUS
  Filled 2019-10-31 (×3): qty 2
  Filled 2019-10-31: qty 1
  Filled 2019-10-31 (×4): qty 2

## 2019-10-31 MED ORDER — SODIUM CHLORIDE 0.9% FLUSH: 9.0000 mL | INTRAVENOUS | Status: DC | PRN

## 2019-10-31 MED ORDER — FENTANYL CITRATE (PF) 100 MCG/2ML IJ SOLN
INTRAMUSCULAR | Status: AC
  Filled 2019-10-31: qty 2

## 2019-10-31 MED ORDER — ONDANSETRON HCL 4 MG/2ML IJ SOLN
INTRAMUSCULAR | Status: DC | PRN
  Administered 2019-10-31: 4 mg via INTRAVENOUS

## 2019-10-31 MED ORDER — PHENYLEPHRINE 40 MCG/ML (10ML) SYRINGE FOR IV PUSH (FOR BLOOD PRESSURE SUPPORT)
PREFILLED_SYRINGE | INTRAVENOUS | Status: DC | PRN
  Administered 2019-10-31: 40 ug via INTRAVENOUS
  Administered 2019-10-31 (×3): 120 ug via INTRAVENOUS

## 2019-10-31 MED ORDER — LABETALOL HCL 5 MG/ML IV SOLN
INTRAVENOUS | Status: AC
  Filled 2019-10-31: qty 4

## 2019-10-31 MED ORDER — SODIUM CHLORIDE 0.9 % IR SOLN
Status: DC | PRN
  Administered 2019-10-31: 3000 mL
  Administered 2019-10-31 (×2): 1000 mL

## 2019-10-31 MED ORDER — PROMETHAZINE HCL 25 MG/ML IJ SOLN: 6.2500 mg | INTRAMUSCULAR | Status: DC | PRN

## 2019-10-31 MED ORDER — DEXAMETHASONE SODIUM PHOSPHATE 10 MG/ML IJ SOLN
INTRAMUSCULAR | Status: AC
  Filled 2019-10-31: qty 1

## 2019-10-31 MED ORDER — DIPHENHYDRAMINE HCL 50 MG/ML IJ SOLN
INTRAMUSCULAR | Status: AC
  Filled 2019-10-31: qty 1

## 2019-10-31 MED ORDER — LACTATED RINGERS IV SOLN
INTRAVENOUS | Status: AC | PRN
  Administered 2019-10-31: 1000 mL

## 2019-10-31 MED ORDER — FENTANYL CITRATE (PF) 100 MCG/2ML IJ SOLN
INTRAMUSCULAR | Status: AC
  Administered 2019-10-31: 25 ug via INTRAVENOUS
  Filled 2019-10-31: qty 2

## 2019-10-31 MED ORDER — ROCURONIUM BROMIDE 10 MG/ML (PF) SYRINGE
PREFILLED_SYRINGE | INTRAVENOUS | Status: DC | PRN
  Administered 2019-10-31: 50 mg via INTRAVENOUS
  Administered 2019-10-31: 10 mg via INTRAVENOUS

## 2019-10-31 MED ORDER — SUCCINYLCHOLINE CHLORIDE 200 MG/10ML IV SOSY
PREFILLED_SYRINGE | INTRAVENOUS | Status: DC | PRN
  Administered 2019-10-31: 160 mg via INTRAVENOUS

## 2019-10-31 MED ORDER — DEXAMETHASONE SODIUM PHOSPHATE 10 MG/ML IJ SOLN
INTRAMUSCULAR | Status: DC | PRN
  Administered 2019-10-31: 8 mg via INTRAVENOUS

## 2019-10-31 MED ORDER — ONDANSETRON HCL 4 MG/2ML IJ SOLN: 4.0000 mg | Freq: Four times a day (QID) | INTRAMUSCULAR | Status: DC | PRN

## 2019-10-31 MED ORDER — FENTANYL CITRATE (PF) 100 MCG/2ML IJ SOLN
INTRAMUSCULAR | Status: DC | PRN
  Administered 2019-10-31: 50 ug via INTRAVENOUS
  Administered 2019-10-31: 100 ug via INTRAVENOUS
  Administered 2019-10-31: 50 ug via INTRAVENOUS
  Administered 2019-10-31: 100 ug via INTRAVENOUS

## 2019-10-31 MED ORDER — PROPOFOL 10 MG/ML IV BOLUS
INTRAVENOUS | Status: DC | PRN
  Administered 2019-10-31: 30 mg via INTRAVENOUS
  Administered 2019-10-31: 130 mg via INTRAVENOUS

## 2019-10-31 MED ORDER — LIDOCAINE 2% (20 MG/ML) 5 ML SYRINGE
INTRAMUSCULAR | Status: DC | PRN
  Administered 2019-10-31: 1.5 mg/kg/h via INTRAVENOUS

## 2019-10-31 MED ORDER — NALOXONE HCL 0.4 MG/ML IJ SOLN: 0.4000 mg | INTRAMUSCULAR | Status: DC | PRN

## 2019-10-31 MED ORDER — ROCURONIUM BROMIDE 10 MG/ML (PF) SYRINGE
PREFILLED_SYRINGE | INTRAVENOUS | Status: AC
  Filled 2019-10-31: qty 10

## 2019-10-31 MED ORDER — BUPIVACAINE HCL 0.5 % IJ SOLN
INTRAMUSCULAR | Status: DC | PRN
  Administered 2019-10-31: 20 mL

## 2019-10-31 MED ORDER — SUGAMMADEX SODIUM 200 MG/2ML IV SOLN
INTRAVENOUS | Status: DC | PRN
  Administered 2019-10-31: 170 mg via INTRAVENOUS

## 2019-10-31 MED ORDER — HYDRALAZINE HCL 20 MG/ML IJ SOLN
INTRAMUSCULAR | Status: AC
  Filled 2019-10-31: qty 1

## 2019-10-31 MED ORDER — HYDRALAZINE HCL 20 MG/ML IJ SOLN
10.0000 mg | Freq: Once | INTRAMUSCULAR | Status: AC
  Administered 2019-10-31: 10 mg via INTRAVENOUS

## 2019-10-31 MED ORDER — CHLORHEXIDINE GLUCONATE CLOTH 2 % EX PADS
6.0000 | MEDICATED_PAD | Freq: Every day | CUTANEOUS | Status: DC
  Administered 2019-10-31 – 2019-11-05 (×4): 6 via TOPICAL

## 2019-10-31 MED ORDER — SODIUM CHLORIDE 0.9 % IR SOLN
Status: DC | PRN
  Administered 2019-10-31: 2000 mL

## 2019-10-31 MED ORDER — LIDOCAINE HCL 2 % IJ SOLN
INTRAMUSCULAR | Status: AC
  Filled 2019-10-31: qty 20

## 2019-10-31 MED ORDER — DIPHENHYDRAMINE HCL 50 MG/ML IJ SOLN
INTRAMUSCULAR | Status: DC | PRN
  Administered 2019-10-31: 12.5 mg via INTRAVENOUS

## 2019-10-31 MED ORDER — DIPHENHYDRAMINE HCL 12.5 MG/5ML PO ELIX
12.5000 mg | ORAL_SOLUTION | Freq: Four times a day (QID) | ORAL | Status: DC | PRN
  Filled 2019-10-31: qty 5

## 2019-10-31 MED ORDER — LACTATED RINGERS IV SOLN: INTRAVENOUS | Status: DC | PRN

## 2019-10-31 MED ORDER — LIDOCAINE 2% (20 MG/ML) 5 ML SYRINGE
INTRAMUSCULAR | Status: DC | PRN
  Administered 2019-10-31: 100 mg via INTRAVENOUS

## 2019-10-31 MED ORDER — FENTANYL CITRATE (PF) 100 MCG/2ML IJ SOLN
25.0000 ug | INTRAMUSCULAR | Status: DC | PRN
  Administered 2019-10-31: 25 ug via INTRAVENOUS

## 2019-10-31 MED ORDER — PROMETHAZINE HCL 25 MG/ML IJ SOLN
INTRAMUSCULAR | Status: AC
  Administered 2019-10-31: 6.25 mg via INTRAVENOUS
  Filled 2019-10-31: qty 1

## 2019-10-31 SURGICAL SUPPLY — 96 items
ADAPTER GOLDBERG URETERAL (ADAPTER) ×1 IMPLANT
ADH SKN CLS APL DERMABOND .7 (GAUZE/BANDAGES/DRESSINGS) ×3
ADPR CATH 15X14FR FL DRN BG (ADAPTER) ×3
APL SWBSTK 6 STRL LF DISP (MISCELLANEOUS) ×6
APPLICATOR COTTON TIP 6 STRL (MISCELLANEOUS) ×6 IMPLANT
APPLICATOR COTTON TIP 6IN STRL (MISCELLANEOUS) ×8 IMPLANT
APPLIER CLIP 5 13 M/L LIGAMAX5 (MISCELLANEOUS)
APR CLP MED LRG 5 ANG JAW (MISCELLANEOUS)
BAG URO CATCHER STRL LF (MISCELLANEOUS) ×4 IMPLANT
BLADE EXTENDED COATED 6.5IN (ELECTRODE) ×4 IMPLANT
BLADE HEX COATED 2.75 (ELECTRODE) ×4 IMPLANT
BLADE SURG SZ10 CARB STEEL (BLADE) ×4 IMPLANT
CABLE HIGH FREQUENCY MONO STRZ (ELECTRODE) ×4 IMPLANT
CATH INTERMIT  6FR 70CM (CATHETERS) ×2 IMPLANT
CELLS DAT CNTRL 66122 CELL SVR (MISCELLANEOUS) ×3 IMPLANT
CLIP APPLIE 5 13 M/L LIGAMAX5 (MISCELLANEOUS) IMPLANT
CLIP VESOCCLUDE LG 6/CT (CLIP) IMPLANT
CLOTH BEACON ORANGE TIMEOUT ST (SAFETY) ×4 IMPLANT
COVER MAYO STAND STRL (DRAPES) ×4 IMPLANT
COVER WAND RF STERILE (DRAPES) IMPLANT
DECANTER SPIKE VIAL GLASS SM (MISCELLANEOUS) ×4 IMPLANT
DERMABOND ADVANCED (GAUZE/BANDAGES/DRESSINGS) ×1
DERMABOND ADVANCED .7 DNX12 (GAUZE/BANDAGES/DRESSINGS) IMPLANT
DRAPE LAPAROSCOPIC ABDOMINAL (DRAPES) ×3 IMPLANT
DRAPE SHEET LG 3/4 BI-LAMINATE (DRAPES) IMPLANT
DRAPE WARM FLUID 44X44 (DRAPES) ×4 IMPLANT
DRSG OPSITE POSTOP 4X10 (GAUZE/BANDAGES/DRESSINGS) ×1 IMPLANT
DRSG TEGADERM 4X4.75 (GAUZE/BANDAGES/DRESSINGS) ×1 IMPLANT
ELECT BLADE 6.5 .24CM SHAFT (ELECTRODE) ×1 IMPLANT
ELECT REM PT RETURN 15FT ADLT (MISCELLANEOUS) ×4 IMPLANT
GAUZE SPONGE 2X2 8PLY STRL LF (GAUZE/BANDAGES/DRESSINGS) IMPLANT
GAUZE SPONGE 4X4 12PLY STRL (GAUZE/BANDAGES/DRESSINGS) ×4 IMPLANT
GLOVE BIOGEL M 8.0 STRL (GLOVE) ×4 IMPLANT
GLOVE BIOGEL PI IND STRL 7.0 (GLOVE) ×3 IMPLANT
GLOVE BIOGEL PI INDICATOR 7.0 (GLOVE) ×1
GLOVE INDICATOR 8.0 STRL GRN (GLOVE) ×8 IMPLANT
GLOVE SS BIOGEL STRL SZ 7.5 (GLOVE) ×3 IMPLANT
GLOVE SUPERSENSE BIOGEL SZ 7.5 (GLOVE) ×1
GOWN STRL REUS W/TWL LRG LVL3 (GOWN DISPOSABLE) ×4 IMPLANT
GOWN STRL REUS W/TWL XL LVL3 (GOWN DISPOSABLE) ×12 IMPLANT
GUIDEWIRE ANG ZIPWIRE 038X150 (WIRE) IMPLANT
GUIDEWIRE STR DUAL SENSOR (WIRE) ×4 IMPLANT
HANDLE SUCTION POOLE (INSTRUMENTS) ×3 IMPLANT
KIT BASIN OR (CUSTOM PROCEDURE TRAY) ×4 IMPLANT
KIT TURNOVER KIT A (KITS) ×1 IMPLANT
LEGGING LITHOTOMY PAIR STRL (DRAPES) ×3 IMPLANT
LIGASURE IMPACT 36 18CM CVD LR (INSTRUMENTS) ×1 IMPLANT
MANIFOLD NEPTUNE II (INSTRUMENTS) ×4 IMPLANT
NS IRRIG 1000ML POUR BTL (IV SOLUTION) ×8 IMPLANT
PACK COLON (CUSTOM PROCEDURE TRAY) ×4 IMPLANT
PACK CYSTO (CUSTOM PROCEDURE TRAY) ×4 IMPLANT
PACK GENERAL/GYN (CUSTOM PROCEDURE TRAY) ×3 IMPLANT
PAD POSITIONING PINK XL (MISCELLANEOUS) ×1 IMPLANT
PENCIL SMOKE EVACUATOR (MISCELLANEOUS) IMPLANT
PROTECTOR NERVE ULNAR (MISCELLANEOUS) ×1 IMPLANT
RELOAD PROXIMATE 75MM BLUE (ENDOMECHANICALS) ×12 IMPLANT
RELOAD STAPLE 75 3.8 BLU REG (ENDOMECHANICALS) IMPLANT
RETRACTOR WND ALEXIS 18 MED (MISCELLANEOUS) ×3 IMPLANT
RTRCTR WOUND ALEXIS 18CM MED (MISCELLANEOUS) ×4
SCISSORS LAP 5X35 DISP (ENDOMECHANICALS) ×4 IMPLANT
SET IRRIG TUBING LAPAROSCOPIC (IRRIGATION / IRRIGATOR) ×4 IMPLANT
SET TUBE SMOKE EVAC HIGH FLOW (TUBING) ×4 IMPLANT
SHEARS FOC LG CVD HARMONIC 17C (MISCELLANEOUS) IMPLANT
SHEARS HARMONIC ACE PLUS 36CM (ENDOMECHANICALS) ×4 IMPLANT
SPONGE GAUZE 2X2 STER 10/PKG (GAUZE/BANDAGES/DRESSINGS) ×1
SPONGE LAP 18X18 RF (DISPOSABLE) ×8 IMPLANT
STAPLER PROXIMATE 75MM BLUE (STAPLE) ×1 IMPLANT
STAPLER VISISTAT 35W (STAPLE) ×4 IMPLANT
SUCTION POOLE HANDLE (INSTRUMENTS) ×4
SUT PDS AB 1 CTX 36 (SUTURE) ×8 IMPLANT
SUT PDS AB 1 TP1 96 (SUTURE) ×2 IMPLANT
SUT PDS AB 3-0 SH 27 (SUTURE) IMPLANT
SUT PDS AB 4-0 SH 27 (SUTURE) IMPLANT
SUT PROLENE 2 0 BLUE (SUTURE) IMPLANT
SUT PROLENE 2 0 SH DA (SUTURE) ×1 IMPLANT
SUT SILK 2 0 (SUTURE) ×4
SUT SILK 2 0 SH CR/8 (SUTURE) ×4 IMPLANT
SUT SILK 2 0SH CR/8 30 (SUTURE) IMPLANT
SUT SILK 2-0 18XBRD TIE 12 (SUTURE) ×3 IMPLANT
SUT SILK 2-0 30XBRD TIE 12 (SUTURE) IMPLANT
SUT SILK 3 0 (SUTURE) ×4
SUT SILK 3 0 SH CR/8 (SUTURE) ×4 IMPLANT
SUT SILK 3-0 18XBRD TIE 12 (SUTURE) ×3 IMPLANT
SUT VIC AB 2-0 SH 18 (SUTURE) ×4 IMPLANT
SUT VIC AB 3-0 SH 18 (SUTURE) ×4 IMPLANT
SUT VIC AB 3-0 SH 8-18 (SUTURE) IMPLANT
SUT VICRYL 2 0 18  UND BR (SUTURE)
SUT VICRYL 2 0 18 UND BR (SUTURE) IMPLANT
TAPE CLOTH 4X10 WHT NS (GAUZE/BANDAGES/DRESSINGS) IMPLANT
TOWEL OR 17X26 10 PK STRL BLUE (TOWEL DISPOSABLE) ×4 IMPLANT
TRAY FOLEY MTR SLVR 16FR STAT (SET/KITS/TRAYS/PACK) ×4 IMPLANT
TROCAR XCEL BLUNT TIP 100MML (ENDOMECHANICALS) ×4 IMPLANT
TROCAR XCEL NON-BLD 11X100MML (ENDOMECHANICALS) IMPLANT
TROCAR XCEL UNIV SLVE 11M 100M (ENDOMECHANICALS) IMPLANT
TUBING CONNECTING 10 (TUBING) ×4 IMPLANT
YANKAUER SUCT BULB TIP NO VENT (SUCTIONS) ×4 IMPLANT

## 2019-10-31 NOTE — Progress Notes (Signed)
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 Gas sounds and gurgling were heard but no output noted.  Upon transfer from bed to stretcher, more gurgling was heard and suddenly the colostomy bag filled with stool to the point of leaking from all sides. Liquid stool leaked under the midline surgical dressing and into the honeycomb.    Pt was assisted to clean up and the surgeon Dr. Brantley Stage was notified. Orders received for nurse to change the midline surgical dressing. Supplies ordered from OR front desk, awaiting their arrival.   Report given to Maudie Mercury, nurse on 6E.  Coolidge Breeze, RN 10/31/2019 1630

## 2019-10-31 NOTE — H&P (View-Only) (Signed)
2 Days Post-Op   Subjective/Chief Complaint: BLOATED  Still distended  Did not tolerate prep Some BM but not much    Objective: Vital signs in last 24 hours: Temp:  [97.7 F (36.5 C)] 97.7 F (36.5 C) (03/19 0516) Pulse Rate:  [81-92] 81 (03/19 0516) Resp:  [15-18] 18 (03/19 0516) BP: (152-157)/(96-101) 157/96 (03/19 0516) SpO2:  [92 %-96 %] 96 % (03/19 0516) Last BM Date: 10/30/19  Intake/Output from previous day: 03/18 0701 - 03/19 0700 In: 240 [P.O.:240] Out: -  Intake/Output this shift: No intake/output data recorded.  GI: distended abdomen quietno peritonitis   Lab Results:  Recent Labs    10/28/19 1552 10/29/19 0507  WBC 8.6 9.9  HGB 14.4 16.8  HCT 42.9 50.8  PLT 200 275   BMET Recent Labs    10/28/19 1552 10/29/19 0507  NA 139 136  K 3.0* 4.6  CL 107 99  CO2 21* 26  GLUCOSE 110* 143*  BUN 14 13  CREATININE 0.57* 0.86  CALCIUM 7.7* 9.3   PT/INR No results for input(s): LABPROT, INR in the last 72 hours. ABG No results for input(s): PHART, HCO3 in the last 72 hours.  Invalid input(s): PCO2, PO2  Studies/Results: No results found.  Anti-infectives: Anti-infectives (From admission, onward)   Start     Dose/Rate Route Frequency Ordered Stop   10/31/19 0600  cefoTEtan (CEFOTAN) 2 g in sodium chloride 0.9 % 100 mL IVPB     2 g 200 mL/hr over 30 Minutes Intravenous On call to O.R. 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 - s/p flex sig 3/17 with above findings - biopsies pending - CEA ordered - would not advance past liquid diet given partial obstruction and abdominal distention - agree with gentle prep which GI ordered - encourage  mobilization as tolerated - will likely plan for lap assisted vs open sigmoid colectomy later this week - Urology consult for left ureteral stent 3/19/ laparoscopic vs open partial colectomy 3/19 OR today Unlikely to be able to do laparoscopically and will need a colostomy since unable to prep and colon with massive dilation Discussed with the patient and pt is marked  Proceed to OR later today The procedure was discussed with the patient.  Laparoscopic partial colectomy discussed with the patient as well as non operative treatments. The risks of operative management include bleeding,  Infection,  Leak of anastamosis,  Ostomy formation, open procedure,  Sepsis,  Abcess,  Hernia,  DVT,  Pulmonary complications,  Cardiovascular  complications,  Injury to ureter,  Bladder,kidney,and anesthesia risks,  And death. The patient understands.  Questions answered.   The success of the procedure is 50-85 % for treating the patients symptoms. They agree to proceed. Initial path shows inflammation but I told him malignancy still a possibility  FEN: CLD, IVF VTE: SQ heparin ID: no current abx   LOS: 3 days    Wesley [name] 10/31/2019

## 2019-10-31 NOTE — Progress Notes (Signed)
2 Days Post-Op   Subjective/Chief Complaint: BLOATED  Still distended  Did not tolerate prep Some BM but not much    Objective: Vital signs in last 24 hours: Temp:  [97.7 F (36.5 C)] 97.7 F (36.5 C) (03/19 0516) Pulse Rate:  [81-92] 81 (03/19 0516) Resp:  [15-18] 18 (03/19 0516) BP: (152-157)/(96-101) 157/96 (03/19 0516) SpO2:  [92 %-96 %] 96 % (03/19 0516) Last BM Date: 10/30/19  Intake/Output from previous day: 03/18 0701 - 03/19 0700 In: 240 [P.O.:240] Out: -  Intake/Output this shift: No intake/output data recorded.  GI: distended abdomen quietno peritonitis   Lab Results:  Recent Labs    10/28/19 1552 10/29/19 0507  WBC 8.6 9.9  HGB 14.4 16.8  HCT 42.9 50.8  PLT 200 275   BMET Recent Labs    10/28/19 1552 10/29/19 0507  NA 139 136  K 3.0* 4.6  CL 107 99  CO2 21* 26  GLUCOSE 110* 143*  BUN 14 13  CREATININE 0.57* 0.86  CALCIUM 7.7* 9.3   PT/INR No results for input(s): LABPROT, INR in the last 72 hours. ABG No results for input(s): PHART, HCO3 in the last 72 hours.  Invalid input(s): PCO2, PO2  Studies/Results: No results found.  Anti-infectives: Anti-infectives (From admission, onward)   Start     Dose/Rate Route Frequency Ordered Stop   10/31/19 0600  cefoTEtan (CEFOTAN) 2 g in sodium chloride 0.9 % 100 mL IVPB     2 g 200 mL/hr over 30 Minutes Intravenous On call to O.R. 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 - s/p flex sig 3/17 with above findings - biopsies pending - CEA ordered - would not advance past liquid diet given partial obstruction and abdominal distention - agree with gentle prep which GI ordered - encourage  mobilization as tolerated - will likely plan for lap assisted vs open sigmoid colectomy later this week - Urology consult for left ureteral stent 3/19/ laparoscopic vs open partial colectomy 3/19 OR today Unlikely to be able to do laparoscopically and will need a colostomy since unable to prep and colon with massive dilation Discussed with the patient and pt is marked  Proceed to OR later today The procedure was discussed with the patient.  Laparoscopic partial colectomy discussed with the patient as well as non operative treatments. The risks of operative management include bleeding,  Infection,  Leak of anastamosis,  Ostomy formation, open procedure,  Sepsis,  Abcess,  Hernia,  DVT,  Pulmonary complications,  Cardiovascular  complications,  Injury to ureter,  Bladder,kidney,and anesthesia risks,  And death. The patient understands.  Questions answered.   The success of the procedure is 50-85 % for treating the patients symptoms. They agree to proceed. Initial path shows inflammation but I told him malignancy still a possibility  FEN: CLD, IVF VTE: SQ heparin ID: no current abx   LOS: 3 days    Wesley [name] A Wesley [name] 10/31/2019

## 2019-10-31 NOTE — Progress Notes (Signed)
PROGRESS NOTE  Wesley [name]  [id number] DOB: Nov 28, 1940 DOA: 10/28/2019 PCP: Deland Pretty, MD   Brief Narrative: Wesley [name] is a 79 y.o. male with a history of CAD, HTN, HLD, OSA, BPH, GERD, and a couple months of abdominal pain, poor per oral intake, fatigue culminating in several days of constipation and abdominal distention. In the ED, a CT of the abdomen demonstrated distal partial obstruction with possible mass. Ht was admitted for colonoscopy 3/17. This showed obstructive mass with poor prep. Biopsy taken and ultimately showed only inflammation without dysplasia. Abdomen remains distended with clinical evidence of obstruction. General surgery is consulted, planning open colectomy with colostomy following urology placement of bilateral ureteral stents on 3/19.  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 Initial biopsy results from 3/17 showing inflammation without dysplasia.  - Proceed to colectomy, colostomy 3/19 after ureteral stenting by urology.   CAD, HLD: No chest pain.  - Continue statin, holding ASA for now  HTN:  - Continue lisinopril  BPH:  - Continue finasteride  GERD:  - Continue PPI  DVT prophylaxis: Heparin 5,000 units q8h Code Status: Full Family Communication: None at bedside. Wife updated with plan yesterday in person. Disposition Plan: Colectomy planned today. Patient from home and likely to return there depending on postoperative progress. Will follow surgery recommendations regarding timing of disposition.  Consultants:   GI  Surgery  Procedures:   Flexible sigmoidoscopy 3/17:  Findings:      The digital rectal exam  was normal.      Extensive amounts of semi-liquid semi-solid stool was found in the       rectum and in the sigmoid colon, making visualization difficult. Copious       irrigation and lavage performed.      Many small-mouthed diverticula were found in the sigmoid colon.      There is a stricture at approximately 30 cm from the dentate line. This       is in a segment of severe diverticulosis and there is a polypoid       partially obstructing mass at this location. Given the stricture and       poor preparation due to partial obstruction, the mass is difficult to       fully characterized, but appears to be partially circumferential. The       mass and stricture is rather short segment, approximately 3-4 cm in       length. No bleeding was present. Biopsies were taken on the mass and at       the stricture with a cold forceps for histology. Area well distal to       this mass was tattooed with 2 injections on opposite walls totally 4 mL       of Spot (carbon black). Of note colon above the stricture is dilated but       appears very viable though views limited by stool which could not be       cleared. Impression:       - Stool in the rectum and in the sigmoid colon.  Visualization limited.                           - Diverticulosis in the sigmoid colon.                           - Rule out malignancy, partially obstructing tumor                            causing narrowing as above in the sigmoid colon.                            Biopsied. Tattooed.  Antimicrobials:  None   Subjective: Very minimal flatus, no significant BM, did not tolerate much prep, having progressive abdominal distention, cramping, nausea.   Objective: Vitals:   10/30/19 0648 10/30/19 1344 10/30/19 1941 10/31/19 0516  BP: (!) 153/105 (!) 152/101  (!) 157/96  Pulse: 94 92 84 81  Resp: 17 16 15 18   Temp: 97.6 F (36.4 C) 97.7 F (36.5 C)  97.7 F (36.5 C)  TempSrc: Oral Oral  Oral   SpO2: 95% 92% 94% 96%  Weight:      Height:        Intake/Output Summary (Last 24 hours) at 10/31/2019 0855 Last data filed at 10/30/2019 0900 Gross per 24 hour  Intake 240 ml  Output --  Net 240 ml   Filed Weights   10/29/19 1104  Weight: 81.6 kg   Gen: 79 y.o. male in no distress Pulm: Nonlabored breathing room air. Clear. CV: Regular rate and rhythm. No murmur, rub, or gallop. No JVD, no dependent edema. GI: Abdomen distended, diffusely tender without rebound. Hypoactive bowel sounds. Ext: Warm, no deformities Skin: No rashes, lesions or ulcers on visualized skin. Abdominal wall marks paraumbilical stable. Neuro: Alert and oriented. No focal neurological deficits. Psych: Judgement and insight appear fair. Mood euthymic & affect congruent. Behavior is appropriate.     Data Reviewed: I have personally reviewed following labs and imaging studies  CBC: Recent Labs  Lab 10/28/19 1552 10/29/19 0507  WBC 8.6 9.9  NEUTROABS 6.8  --   HGB 14.4 16.8  HCT 42.9 50.8  MCV 98.8 97.7  PLT 200 [id number]   Basic Metabolic Panel: Recent Labs  Lab 10/28/19 1552 10/29/19 0507  NA 139 136  K 3.0* 4.6  CL 107 99  CO2 21* 26  GLUCOSE 110* 143*  BUN 14 13  CREATININE 0.57* 0.86  CALCIUM 7.7* 9.3   GFR: Estimated Creatinine Clearance: 68.5 mL/min (by C-G formula based on SCr of 0.86 mg/dL). Liver Function Tests: Recent Labs  Lab 10/28/19 1552 10/29/19 0507  AST 18 19  ALT 15 15  ALKPHOS 72 80  BILITOT 0.9 0.6  PROT 6.2* 7.4  ALBUMIN 3.4* 3.9   Recent Labs  Lab 10/28/19 1552  LIPASE 20   No results for input(s): AMMONIA in the last 168 hours. Coagulation Profile: No results for input(s): INR, PROTIME in the last 168 hours. Cardiac Enzymes: No results for input(s): CKTOTAL, CKMB, CKMBINDEX, TROPONINI in the last 168 hours. BNP (last 3 results) No results for input(s): PROBNP in the last 8760 hours. HbA1C: Recent Labs    10/30/19 0826  HGBA1C 5.6   CBG: No  results for input(s): GLUCAP in the last 168 hours. Lipid Profile: No results for input(s): CHOL, HDL,  LDLCALC, TRIG, CHOLHDL, LDLDIRECT in the last 72 hours. Thyroid Function Tests: No results for input(s): TSH, T4TOTAL, FREET4, T3FREE, THYROIDAB in the last 72 hours. Anemia Panel: No results for input(s): VITAMINB12, FOLATE, FERRITIN, TIBC, IRON, RETICCTPCT in the last 72 hours. Urine analysis:    Component Value Date/Time   COLORURINE YELLOW 10/28/2019 1800   APPEARANCEUR CLEAR 10/28/2019 1800   LABSPEC 1.034 (H) 10/28/2019 1800   PHURINE 5.0 10/28/2019 1800   GLUCOSEU NEGATIVE 10/28/2019 1800   HGBUR SMALL (A) 10/28/2019 1800   BILIRUBINUR NEGATIVE 10/28/2019 1800   KETONESUR 20 (A) 10/28/2019 1800   PROTEINUR NEGATIVE 10/28/2019 1800   NITRITE NEGATIVE 10/28/2019 1800   LEUKOCYTESUR NEGATIVE 10/28/2019 1800   Recent Results (from the past 240 hour(s))  Respiratory Panel by RT PCR (Flu A&B, Covid) - Nasopharyngeal Swab     Status: None   Collection Time: 10/28/19  7:17 PM   Specimen: Nasopharyngeal Swab  Result Value Ref Range Status   SARS Coronavirus 2 by RT PCR NEGATIVE NEGATIVE Final    Comment: (NOTE) SARS-CoV-2 target nucleic acids are NOT DETECTED. The SARS-CoV-2 RNA is generally detectable in upper respiratoy specimens during the acute phase of infection. The lowest concentration of SARS-CoV-2 viral copies this assay can detect is 131 copies/mL. A negative result does not preclude SARS-Cov-2 infection and should not be used as the sole basis for treatment or other patient management decisions. A negative result may occur with  improper specimen collection/handling, submission of specimen other than nasopharyngeal swab, presence of viral mutation(s) within the areas targeted by this assay, and inadequate number of viral copies (<131 copies/mL). A negative result must be combined with clinical observations, patient history, and epidemiological information.  The expected result is Negative. Fact Sheet for Patients:  PinkCheek.be Fact Sheet for Healthcare Providers:  GravelBags.it This test is not yet ap proved or cleared by the [place] FDA and  has been authorized for detection and/or diagnosis of SARS-CoV-2 by FDA under an Emergency Use Authorization (EUA). This EUA will remain  in effect (meaning this test can be used) for the duration of the COVID-19 declaration under Section 564(b)(1) of the Act, 21 U.S.C. section 360bbb-3(b)(1), unless the authorization is terminated or revoked sooner.    Influenza A by PCR NEGATIVE NEGATIVE Final   Influenza B by PCR NEGATIVE NEGATIVE Final    Comment: (NOTE) The Xpert Xpress SARS-CoV-2/FLU/RSV assay is intended as an aid in  the diagnosis of influenza from Nasopharyngeal swab specimens and  should not be used as a sole basis for treatment. Nasal washings and  aspirates are unacceptable for Xpert Xpress SARS-CoV-2/FLU/RSV  testing. Fact Sheet for Patients: PinkCheek.be Fact Sheet for Healthcare Providers: GravelBags.it This test is not yet approved or cleared by the [place] FDA and  has been authorized for detection and/or diagnosis of SARS-CoV-2 by  FDA under an Emergency Use Authorization (EUA). This EUA will remain  in effect (meaning this test can be used) for the duration of the  Covid-19 declaration under Section 564(b)(1) of the Act, 21  U.S.C. section 360bbb-3(b)(1), unless the authorization is  terminated or revoked. Performed at [facility], [place] [address], [place], [place] [address]   Surgical pcr screen     Status: None   Collection Time: 10/30/19 10:35 PM   Specimen: Nasal Mucosa; Nasal Swab  Result Value Ref Range Status   MRSA, PCR NEGATIVE NEGATIVE Final   Staphylococcus aureus NEGATIVE NEGATIVE Final    Comment: (NOTE) The  Xpert  SA Assay (FDA approved for NASAL specimens in patients 31 years of age and older), is one component of a comprehensive surveillance program. It is not intended to diagnose infection nor to guide or monitor treatment. Performed at [facility], [place] [address] [place],  [address]       Radiology Studies: No results found.  Scheduled Meds: . acetaminophen  1,000 mg Oral On Call to OR  . alvimopan  12 mg Oral On Call to OR  . atorvastatin  80 mg Oral q1800  . enoxaparin (LOVENOX) injection  40 mg Subcutaneous On Call to OR  . finasteride  5 mg Oral Daily  . gabapentin  300 mg Oral On Call to OR  . lisinopril  10 mg Oral Daily  . loratadine  10 mg Oral Daily  . vitamin B-12  1,000 mcg Oral Daily   Continuous Infusions: . sodium chloride    . cefoTEtan (CEFOTAN) IV    . dextrose 5 % and 0.45% NaCl 100 mL/hr at 10/30/19 1749     LOS: 3 days   Time spent: 25 minutes.  Patrecia Pour, MD [facility] [email or web address] 10/31/2019, 8:55 AM

## 2019-10-31 NOTE — Op Note (Signed)
Preoperative diagnosis: Sigmoid colon mass causing complete colonic obstruction  Postoperative diagnosis: Same  Procedure: Sigmoid colectomy with end colostomy and creation of Hartman's pouch with initial diagnostic laparoscopy  Surgeon: Erroll Luna, MD  Anesthesia: General with 0.25% local  EBL: 100 cc  Specimen: Sigmoid colon with stricture which was opened on the back table which showed a benign-appearing stricture sent to pathology  Drains: None  IV fluids: Per anesthesia record  Indications for procedure: The patient is a 79 year old male admitted 2 days ago with signs of a large obstruction.  Colonoscopy was performed and at 30 cm of sigmoid colon mass was identified.  This appeared to be near obstructing.  This was tattooed and biopsied.  Final pathology showed inflammatory changes.  He was unable to be bowel prep and had significant distention.  We discussed laparoscopic and open procedures.  Given the mental distention I was not optimistic a laparoscopic approach could be undertaken and explained that to him and his wife.  I felt he would need colostomy given the amount of obstructive symptoms he had in distention.The procedure has been discussed with the patient.  Alternative therapies have been discussed with the patient.  Operative risks include bleeding,  Infection,  Organ injury,  Nerve injury,  Blood vessel injury,  DVT,  Pulmonary embolism,  Death,  And possible reoperation.  Medical management risks include worsening of present situation.  The success of the procedure is 50 -90 % at treating patients symptoms.  The patient understands and agrees to proceed.  The procedure was discussed with the patient.  Laparoscopic partial colectomy discussed with the patient as well as non operative treatments. The risks of operative management include bleeding,  Infection,  Leak of anastamosis,  Ostomy formation, open procedure,  Sepsis,  Abcess,  Hernia,  DVT,  Pulmonary complications,   Cardiovascular  complications,  Injury to ureter,  Bladder,kidney,and anesthesia risks,  And death. The patient understands.  Questions answered.   The success of the procedure is 50-85 % for treating the patients symptoms. They agree to proceed.   Description of procedure: The patient was met in the holding area and questions were answered.  He was taken back to the operative room.  General esthesia initiated.  Urology was consulted and cystoscopy performed and bilateral ureteral stents placed given the location of the process to his left ureter by urology.  Please see their note for detail.  Once this was complete he was already in lithotomy.  His abdomen was then prepped and draped in sterile fashion.  Timeout performed.  He received appropriate preoperative antibiotics.  Both arms of note were tucked from the cystoscopy.  A right upper quadrant 5 mm incision was made.  An Optiview trocar was used with direct guidance to the abdominal wall into the abdominal cavity.  This was done without injury.  Pneumoperitoneum was then created to 15 mmHg of CO2.  Upon inspection he had both large and small dilation making laparoscopy virtually impossible since there is no space to work in.  After seeing this, I felt that conversion to open was necessary due to the his obstruction.  5 mm port was removed and all instruments were passed to the field.  Lower midline incision was used.  This was done from just above the umbilicus to the pubic symphysis.  Dissection was carried down to the midline and the linea alba was identified open.  Of note he has small piece of mesh from previous open umbilical hernia repair and  we went had gone through that.  Upon entering abdominal cavity there is a scant amount of ascites.  He had significant severe dilation of small and large bowel.  A retractor was placed.  He was placed in Trendelenburg and the small and large bowel were packed out of the field with moist sponges.  We then began  to mobilize the left colon and sigmoid colon from the left lateral attachments along the white line of Toldt.  The mass was well tattooed and was stuck in the left lower pelvis.  This was quite densely adherent to the left lower pelvis.  Cautery was used to mobilize this up out of the pelvis.  There are bilateral ureteral stents and I could palpate these and stay well away from these both on left and right side.  Once the mass was fully mobilized I divided the descending sigmoid colon with a GIA 75 stapler.  I was then able to use LigaSure to dissect the mesentery taking care to stay away from the ureter of the left and right as well as iliac vessels.  This was then dissected out and a distal point of transection was used distal to where the tattoo was from his previous flexible sigmoidoscopy.  I divided the bowel which is probably the proximal rectum that look like.  The entire mass was removed.  Proximal was marked with a single suture.  On the back table I opened the specimen through the stricture which was extremely tight.  This appeared to be secondary to diverticular disease since there is no mucosal-based lesion but this was sent on to pathology for further evaluation.  Irrigation was used and suctioned out.  Hemostasis was achieved in the pelvis.  I reexamined the pelvis and the left and right ureter were both preserved.  A stitch was placed in the rectal stump.  2-0 Prolene was used to these used to mark the rectal stump.  I then mobilized the descending colon along the white line of Toldt toward the splenic flexure.  This provided ample length.  The colon was massively dilated.  All packs were then removed.  NG tube was then secured in the stomach.  The ascending colon, transverse colon and remainder of the descending colon were grossly normal except the cecum was quite dilated from distention obstruction.  There is significant small bowel dilation as well to the ligament of Treitz.  Liver was grossly  normal upon inspection and palpation.  At this point this the patient was marked for colostomy.  Elliptical incision was made around the mark.  Dissection was carried down to the anterior sheath of the rectus muscle.  Anterior sheath was opened and the fibers were split and the posterior sheath was then opened.  I passed a Babcock and pulled out the descending colon through this without much difficulty.  We then closed the fascia with double-stranded #1 PDS.  Staples used to close the skin.  Colostomy matured with 3-0 Vicryl.  Appliance applied.  Dry dressings placed.  The ureteral stents were removed with a Foley catheter was left in place.  All final counts were found to be correct of sponge, needles and instruments.  Patient was then taken out of lithotomy extubated taken to recovery in satisfactory condition.

## 2019-10-31 NOTE — Interval H&P Note (Signed)
History and Physical Interval Note:  10/31/2019 11:50 AM  Wesley [name]  has presented today for surgery, with the diagnosis of COLONIC OBSTRUCTION.  The various methods of treatment have been discussed with the patient and family. After consideration of risks, benefits and other options for treatment, the patient has consented to  Procedure(s): LAPAROSCOPIC SIGMOID COLECTOMY (N/A) COLOSTOMY (N/A) CYSTOSCOPY WITH RETROGRADE PYELOGRAM/URETERAL STENT PLACEMENT (N/A) as a surgical intervention.  The patient's history has been reviewed, patient examined, no change in status, stable for surgery.  I have reviewed the patient's chart and labs.  Questions were answered to the patient's satisfaction.     Inez

## 2019-10-31 NOTE — Care Management Important Message (Signed)
Important Message  Patient Details IM Letter given to Marney Doctor RN Case Manager to present to the Patient Name: Wesley [name] MRN: [id number] Date of Birth: 12-Nov-1940   Medicare Important Message Given:  Yes     Kerin Salen 10/31/2019, 10:11 AM

## 2019-10-31 NOTE — Anesthesia Procedure Notes (Signed)
Procedure Name: Intubation Date/Time: 10/31/2019 12:37 PM Performed by: Niel Hummer, CRNA Pre-anesthesia Checklist: Patient identified, Emergency Drugs available, Suction available and Patient being monitored Patient Re-evaluated:Patient Re-evaluated prior to induction Oxygen Delivery Method: Circle system utilized Preoxygenation: Pre-oxygenation with 100% oxygen Induction Type: IV induction, Rapid sequence and Cricoid Pressure applied Laryngoscope Size: Mac and 4 Grade View: Grade I Tube type: Oral Tube size: 7.5 mm Number of attempts: 1 Airway Equipment and Method: Stylet Placement Confirmation: ETT inserted through vocal cords under direct vision,  positive ETCO2 and breath sounds checked- equal and bilateral Secured at: 24 cm Tube secured with: Tape Dental Injury: Teeth and Oropharynx as per pre-operative assessment

## 2019-10-31 NOTE — Transfer of Care (Signed)
Immediate Anesthesia Transfer of Care Note  Patient: Wesley [name]  Procedure(s) Performed: DIAGNOSTIC LAPAROSCOPY; EXPLORATORY LAPAROTOMY; SIGMOID COLECTOMY (N/A Abdomen) COLOSTOMY (N/A ) CYSTOSCOPY URETERAL STENT PLACEMENT BILATERAL (Bilateral Ureter)  Patient Location: PACU  Anesthesia Type:General  Level of Consciousness: awake and alert   Airway & Oxygen Therapy: Patient Spontanous Breathing and Patient connected to face mask oxygen  Post-op Assessment: Report given to RN, Post -op Vital signs reviewed and stable and Patient moving all extremities X 4  Post vital signs: Reviewed and stable  Last Vitals:  Vitals Value Taken Time  BP    Temp    Pulse    Resp    SpO2      Last Pain:  Vitals:   10/31/19 0940  TempSrc:   PainSc: 0-No pain      Patients Stated Pain Goal: 0 ([phone])  Complications: No apparent anesthesia complications

## 2019-10-31 NOTE — Interval H&P Note (Signed)
History and Physical Interval Note:  10/31/2019 11:54 AM  Wesley [name]  has presented today for surgery, with the diagnosis of COLONIC OBSTRUCTION.  The various methods of treatment have been discussed with the patient and family. After consideration of risks, benefits and other options for treatment, the patient has consented to  Procedure(s): LAPAROSCOPIC SIGMOID COLECTOMY (N/A) COLOSTOMY (N/A) CYSTOSCOPY WITH RETROGRADE PYELOGRAM/URETERAL STENT PLACEMENT (N/A) as a surgical intervention.  The patient's history has been reviewed, patient examined, no change in status, stable for surgery.  I have reviewed the patient's chart and labs.  Questions were answered to the patient's satisfaction.     Lillette Boxer Keary Waterson

## 2019-10-31 NOTE — Progress Notes (Signed)
OT Cancellation Note  Patient Details Name: TRUSTIN FAVORITO MRN: [id number] DOB: 1941/08/05   Cancelled Treatment:    Reason Eval/Treat Not Completed: Patient at procedure or test/ unavailable. Pt out for sx, will attempt OT eval on different date and time.   Rane Blitch 10/31/2019, 11:43 AM

## 2019-10-31 NOTE — Anesthesia Postprocedure Evaluation (Signed)
Anesthesia Post Note  Patient: Wesley [name]  Procedure(s) Performed: DIAGNOSTIC LAPAROSCOPY; EXPLORATORY LAPAROTOMY; SIGMOID COLECTOMY (N/A Abdomen) COLOSTOMY (N/A ) CYSTOSCOPY URETERAL STENT PLACEMENT BILATERAL (Bilateral Ureter)     Patient location during evaluation: PACU Anesthesia Type: General Level of consciousness: awake and alert and oriented Pain management: pain level controlled Vital Signs Assessment: post-procedure vital signs reviewed and stable Respiratory status: spontaneous breathing, nonlabored ventilation and respiratory function stable Cardiovascular status: blood pressure returned to baseline Postop Assessment: no apparent nausea or vomiting Anesthetic complications: no    Last Vitals:  Vitals:   10/31/19 1545 10/31/19 1600  BP: (!) 137/91 133/90  Pulse: 85 84  Resp: 16 (!) 23  Temp:    SpO2: 96% 96%    Last Pain:  Vitals:   10/31/19 1600  TempSrc:   PainSc: [place]

## 2019-10-31 NOTE — Progress Notes (Signed)
Dressing and colostomy pouch changed per surgeon's orders, assisted by Maudie Mercury, RN. Incision cleansed of liquid stool with soapy water followed with copious amounts of sterile NS. Allowed to dry and honeycomb dressing replaced. Pt tolerated well.  Coolidge Breeze, RN 10/31/2019

## 2019-10-31 NOTE — Anesthesia Preprocedure Evaluation (Addendum)
Anesthesia Evaluation  Patient identified by MRN, date of birth, ID band Patient awake    Reviewed: Allergy & Precautions, NPO status , Patient's Chart, lab work & pertinent test results  History of Anesthesia Complications Negative for: history of anesthetic complications  Airway Mallampati: II       Dental no notable dental hx. (+) Dental Advisory Given   Pulmonary sleep apnea and Continuous Positive Airway Pressure Ventilation ,    Pulmonary exam normal        Cardiovascular hypertension, Pt. on medications + CAD  Normal cardiovascular exam  Notes Recorded by Jerline Pain, MD on 04/11/2016 at 5:11 PM EDT  Nuclear stress EF: 68%.  There was no ST segment deviation noted during stress.  The study is normal. no ischemia. no evidence of previous MI  This is a low risk study. Reassuring Candee Furbish, MD   Neuro/Psych negative neurological ROS     GI/Hepatic Neg liver ROS, GERD  ,  Endo/Other  negative endocrine ROS  Renal/GU negative Renal ROS     Musculoskeletal negative musculoskeletal ROS (+)   Abdominal   Peds  Hematology negative hematology ROS (+)   Anesthesia Other Findings Day of surgery medications reviewed with the patient.  Reproductive/Obstetrics                            Anesthesia Physical Anesthesia Plan  ASA: III  Anesthesia Plan: General   Post-op Pain Management:    Induction: Intravenous, Rapid sequence and Cricoid pressure planned  PONV Risk Score and Plan: 4 or greater and Ondansetron, Dexamethasone, Midazolam and Diphenhydramine  Airway Management Planned: Oral ETT  Additional Equipment:   Intra-op Plan:   Post-operative Plan: Extubation in OR  Informed Consent: I have reviewed the patients History and Physical, chart, labs and discussed the procedure including the risks, benefits and alternatives for the proposed anesthesia with the patient or  authorized representative who has indicated his/her understanding and acceptance.     Dental advisory given  Plan Discussed with: Anesthesiologist  Anesthesia Plan Comments:        Anesthesia Quick Evaluation

## 2019-10-31 NOTE — H&P (Signed)
H&P  Chief Complaint:  Colon mass  History of Present Illness:  79 year old male schedule for laparoscopic assisted versus open colectomy as well as colostomy today.  General surgery has requested bilateral ureteral catheter placement to assist with localization of ureters.  Past Medical History:  Diagnosis Date  . 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 Chronic allergic rhinitis   . Coronary atherosclerosis of native coronary artery   . GERD (gastroesophageal reflux disease)   . High blood pressure   . Hypercholesterolemia   . Macrocytosis without anemia   . OSA on CPAP   . 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 Sleep apnea   . Umbilical hernia without obstruction or gangrene     Past Surgical History:  Procedure Laterality Date  . BIOPSY  10/29/2019   Procedure: BIOPSY;  Surgeon: Jerene Bears, MD;  Location: [facility];  Service: Gastroenterology;;  . CARDIAC CATHETERIZATION  2000   Cath to rule out cardiac problems RT HTN. PT denies significant findings  . CARDIAC CATHETERIZATION  2008  . EYE SURGERY     BILATERAL CATARACT SURGERY WITH LENS IMPLANTS  . FLEXIBLE SIGMOIDOSCOPY N/A 10/29/2019   Procedure: FLEXIBLE SIGMOIDOSCOPY;  Surgeon: Jerene Bears, MD;  Location: [facility];  Service: Gastroenterology;  Laterality: N/A;  . INSERTION OF MESH N/A 09/08/2016   Procedure: INSERTION OF MESH;  Surgeon: Jackolyn Confer, MD;  Location: [facility];  Service: General;  Laterality: N/A;  . right rotator cuff    . ROTATOR CUFF REPAIR    . SUBMUCOSAL TATTOO INJECTION  10/29/2019   Procedure: SUBMUCOSAL TATTOO INJECTION;  Surgeon: Jerene Bears, MD;  Location: [facility];  Service: Gastroenterology;;  . TONSILLECTOMY    . UMBILICAL HERNIA REPAIR N/A 09/08/2016   Procedure: OPEN UMBILICAL HERNIA REPAIR WITH MESH;  Surgeon: Jackolyn Confer, MD;  Location: [facility];  Service: General;  Laterality: N/A;    Home Medications:    Allergies:   Allergies  Allergen Reactions  . Demerol Other (See Comments)    Causes dizziness  . Promethazine Hcl Other (See Comments)    Dizziness     Family History  Problem Relation Age of Onset  . Diabetes Mother   . CAD Father   . Melanoma Brother 82  . Healthy Daughter   . Healthy Son     Social History:  reports that he has never smoked. He has never used smokeless tobacco. He reports current alcohol use of about 14.0 standard drinks of alcohol per week. He reports that he does not use drugs.  ROS: A complete review of systems was performed.  All systems are negative except for pertinent findings as noted.  Physical Exam:  Vital signs in last 24 hours: BP (!) 157/96 (BP Location: Left Arm)   Pulse 81   Temp 97.7 F (36.5 C) (Oral)   Resp 18   Ht 5\' 8"  (1.727 m)   Wt 81.6 kg   SpO2 96%   BMI 27.37 kg/m  Constitutional:  Alert and oriented, No acute distress Cardiovascular: Regular rate  Respiratory: Normal respiratory effort GI: Abdomen is soft, nontender, nondistended, no abdominal masses. No CVAT.  Genitourinary: Normal male phallus, testes are descended bilaterally and non-tender and without masses, scrotum is normal in appearance without lesions or masses, perineum is normal on inspection. Lymphatic: No lymphadenopathy Neurologic: Grossly intact, no focal deficits Psychiatric: Normal mood and affect  Laboratory Data:  Recent Labs  10/28/19 1552 10/29/19 0507  WBC 8.6 9.9  HGB 14.4 16.8  HCT 42.9 50.8  PLT 200 275    Recent Labs    10/28/19 1552 10/29/19 0507  NA 139 136  K 3.0* 4.6  CL 107 99  GLUCOSE 110* 143*  BUN 14 13  CALCIUM 7.7* 9.3  CREATININE 0.57* 0.86     Results for orders placed or performed during the hospital encounter of 10/28/19 (from the past 24 hour(s))  Hemoglobin A1c     Status: None   Collection Time: 10/30/19  8:26 AM  Result Value Ref Range   Hgb A1c MFr Bld 5.6 4.8 - 5.6 %   Mean Plasma Glucose 114.02 mg/dL   Surgical pcr screen     Status: None   Collection Time: 10/30/19 10:35 PM   Specimen: Nasal Mucosa; Nasal Swab  Result Value Ref Range   MRSA, PCR NEGATIVE NEGATIVE   Staphylococcus aureus NEGATIVE NEGATIVE   Recent Results (from the past 240 hour(s))  Respiratory Panel by RT PCR (Flu A&B, Covid) - Nasopharyngeal Swab     Status: None   Collection Time: 10/28/19  7:17 PM   Specimen: Nasopharyngeal Swab  Result Value Ref Range Status   SARS Coronavirus 2 by RT PCR NEGATIVE NEGATIVE Final    Comment: (NOTE) SARS-CoV-2 target nucleic acids are NOT DETECTED. The SARS-CoV-2 RNA is generally detectable in upper respiratoy specimens during the acute phase of infection. The lowest concentration of SARS-CoV-2 viral copies this assay can detect is 131 copies/mL. A negative result does not preclude SARS-Cov-2 infection and should not be used as the sole basis for treatment or other patient management decisions. A negative result may occur with  improper specimen collection/handling, submission of specimen other than nasopharyngeal swab, presence of viral mutation(s) within the areas targeted by this assay, and inadequate number of viral copies (<131 copies/mL). A negative result must be combined with clinical observations, patient history, and epidemiological information. The expected result is Negative. Fact Sheet for Patients:  PinkCheek.be Fact Sheet for Healthcare Providers:  GravelBags.it This test is not yet ap proved or cleared by the [place] FDA and  has been authorized for detection and/or diagnosis of SARS-CoV-2 by FDA under an Emergency Use Authorization (EUA). This EUA will remain  in effect (meaning this test can be used) for the duration of the COVID-19 declaration under Section 564(b)(1) of the Act, 21 U.S.C. section 360bbb-3(b)(1), unless the authorization is terminated or revoked sooner.    Influenza A by  PCR NEGATIVE NEGATIVE Final   Influenza B by PCR NEGATIVE NEGATIVE Final    Comment: (NOTE) The Xpert Xpress SARS-CoV-2/FLU/RSV assay is intended as an aid in  the diagnosis of influenza from Nasopharyngeal swab specimens and  should not be used as a sole basis for treatment. Nasal washings and  aspirates are unacceptable for Xpert Xpress SARS-CoV-2/FLU/RSV  testing. Fact Sheet for Patients: PinkCheek.be Fact Sheet for Healthcare Providers: GravelBags.it This test is not yet approved or cleared by the [place] FDA and  has been authorized for detection and/or diagnosis of SARS-CoV-2 by  FDA under an Emergency Use Authorization (EUA). This EUA will remain  in effect (meaning this test can be used) for the duration of the  Covid-19 declaration under Section 564(b)(1) of the Act, 21  U.S.C. section 360bbb-3(b)(1), unless the authorization is  terminated or revoked. Performed at [facility], [place] [address], [place], [place] [address]   Surgical pcr screen  Status: None   Collection Time: 10/30/19 10:35 PM   Specimen: Nasal Mucosa; Nasal Swab  Result Value Ref Range Status   MRSA, PCR NEGATIVE NEGATIVE Final   Staphylococcus aureus NEGATIVE NEGATIVE Final    Comment: (NOTE) The Xpert SA Assay (FDA approved for NASAL specimens in patients 86 years of age and older), is one component of a comprehensive surveillance program. It is not intended to diagnose infection nor to guide or monitor treatment. Performed at [facility], [place] [address] [place], [place] [address]     Renal Function: Recent Labs    10/28/19 1552 10/29/19 0507  CREATININE 0.57* 0.86   Estimated Creatinine Clearance: 68.5 mL/min (by C-G formula based on SCr of 0.86 mg/dL).  Radiologic Imaging: No results found.  Impression/Assessment:    Colon mass  Plan:   cystoscopy, bilateral ureteral catheter  placement, temporary.  I discussed the procedure with the patient.  He understands and desires to proceed

## 2019-10-31 NOTE — Op Note (Signed)
Preoperative diagnosis:  Mass of large bowel   Postoperative diagnosis: Same  Principal procedure:  Cystoscopy, placement of bilateral ureteral catheters  Surgeon:  Willella Harding  Anesthesia:  General  Complications:  None  Estimated blood loss: None  Drains:  Bilateral 6 French ureteral catheters, Foley catheter  Indications:  79 year old male undergoing colectomy.  Dr. Brantley Stage requests bilateral ureteral catheters to allow for better identification of ureteral structures.  I have discussed the procedure with the patient and we are proceeding with this.    Description of procedure: Patient was properly identified in the holding area.  He was taken the operating room where general anesthetic were administered.  The patient was placed in a dorsolithotomy.  Genitalia and perineum were prepped and draped.  Proper time-out was performed.    21 French pan endoscope was advanced into the bladder.  There was a small stone bearing posterior in the bladder that was removed with the scope.  I then placed bilateral 6 [place] open-ended catheters with the assistance of a Sensor tip guidewire.  These were position more proximally in the ureters bilaterally, approximately 22 cm up.  Following adequate positioning, the guidewire and scope were removed.  42 French Foley catheter was then placed.  The open-ended catheters were sutured to the Foley catheter in hooked to a [place] catheter apparatus.  This point the procedure was terminated.  Dr. Brantley Stage  Commenced with his surgery

## 2019-10-31 NOTE — Evaluation (Signed)
PT Cancellation Note  Patient Details Name: ALDAN DUDDING MRN: [id number] DOB: 03-25-1941   Cancelled Treatment:    Reason Eval/Treat Not Completed: Patient at procedure or test/unavailable(pt at surgery. Will check back tomorrow.)  Philomena Doheny PT 10/31/2019  Acute Rehabilitation Services Pager [phone] Office [phone]

## 2019-11-01 LAB — CBC
HCT: 50.2 % (ref 39.0–52.0)
Hemoglobin: 16.7 g/dL (ref 13.0–17.0)
MCH: 32.6 pg (ref 26.0–34.0)
MCHC: 33.3 g/dL (ref 30.0–36.0)
MCV: 98 fL (ref 80.0–100.0)
Platelets: 248 10*3/uL (ref 150–400)
RBC: 5.12 MIL/uL (ref 4.22–5.81)
RDW: 13.4 % (ref 11.5–15.5)
WBC: 16.3 10*3/uL — ABNORMAL HIGH (ref 4.0–10.5)
nRBC: 0 % (ref 0.0–0.2)

## 2019-11-01 NOTE — Progress Notes (Signed)
Patient does not wish to use nocturnal CPAP tonight due to an indwelling Ngt. RT will continue to follow.

## 2019-11-01 NOTE — Evaluation (Signed)
Occupational Therapy Evaluation Patient Details Name: Wesley [name] MRN: [id number] DOB: 10/24/1940 Today's Date: 11/01/2019    History of Present Illness 79 y.o. male with a history of CAD, HTN, HLD, OSA, BPH, GERD, and a couple months of abdominal pain, poor per oral intake, fatigue culminating in several days of constipation and abdominal distention. In the ED, a CT of the abdomen demonstrated distal partial obstruction with possible mass. Ht was admitted for colonoscopy 3/17. This showed obstructive mass with poor prep. Biopsy taken and ultimately showed only inflammation without dysplasia. General surgery performed sigmoid colectomy with end colostomy following urology placement of bilateral ureteral stents on 3/19.   Clinical Impression   Pt admitted with the above diagnoses and presents with below problem list. Pt will benefit from continued acute OT to address the below listed deficits and maximize independence with basic ADLs prior to d/c home. PTA pt was independent with ADLs. Pt currently min A with UB/LB ADLs and toilet transfers, min guard with functional mobility, mod A with bed mobility. Spouse and daughter present and included in ADL education.      Follow Up Recommendations  Home health OT;Supervision/Assistance - 24 hour    Equipment Recommendations  None recommended by OT    Recommendations for Other Services PT consult     Precautions / Restrictions Precautions Precautions: Fall Precaution Comments: NG tube, colostomy LUQ Required Braces or Orthoses: (abdominal binder ) Restrictions Weight Bearing Restrictions: No      Mobility Bed Mobility Overal bed mobility: Needs Assistance Bed Mobility: Rolling;Sidelying to Sit Rolling: Min guard Sidelying to sit: Mod assist       General bed mobility comments: assist to power up to EOB position  Transfers Overall transfer level: Needs assistance Equipment used: Rolling walker (2 wheeled) Transfers: Sit to/from  Stand Sit to Stand: Min assist;From elevated surface         General transfer comment: to/from elevated bed height (to match home), comfort height toilet and recliner. assist to steady and control decent. Cues for hand placement and technique with rw.    Balance Overall balance assessment: Needs assistance Sitting-balance support: No upper extremity supported;Feet supported Sitting balance-Leahy Scale: Good     Standing balance support: Bilateral upper extremity supported;No upper extremity supported;Single extremity supported Standing balance-Leahy Scale: Fair Standing balance comment: able to static stand without external support. external support for dynamic tasks and walking                           ADL either performed or assessed with clinical judgement   ADL Overall ADL's : Needs assistance/impaired Eating/Feeding: Set up;Sitting Eating/Feeding Details (indicate cue type and reason): NG tube Grooming: Min guard;Wash/dry hands;Standing   Upper Body Bathing: Minimal assistance;Sitting;Min guard;Set up   Lower Body Bathing: Minimal assistance;Sit to/from stand   Upper Body Dressing : Set up;Sitting;Minimal assistance   Lower Body Dressing: Minimal assistance;Sit to/from stand   Toilet Transfer: Minimal assistance;Ambulation;RW;Comfort height toilet;Grab bars Toilet Transfer Details (indicate cue type and reason): steadying assist Toileting- Clothing Manipulation and Hygiene: Minimal assistance;Sit to/from stand       Functional mobility during ADLs: Min guard;Rolling walker General ADL Comments: Pt completed bed mobility, toilet tranfer, 1 grooming task standing at sink, and household distance functional mobility. Began LB ADL education.      Vision         Perception     Praxis      Pertinent Vitals/Pain Pain Assessment: Faces  Faces Pain Scale: Hurts little more Pain Location: abdomen Pain Descriptors / Indicators:  Discomfort;Constant;Guarding Pain Intervention(s): Monitored during session;Repositioned;Premedicated before session     Hand Dominance     Extremity/Trunk Assessment Upper Extremity Assessment Upper Extremity Assessment: Overall WFL for tasks assessed   Lower Extremity Assessment Lower Extremity Assessment: Defer to PT evaluation       Communication Communication Communication: No difficulties   Cognition Arousal/Alertness: Awake/alert Behavior During Therapy: WFL for tasks assessed/performed Overall Cognitive Status: Within Functional Limits for tasks assessed                                     General Comments  Spouse and daughter present throughout session and included in education    Exercises     Shoulder Instructions      Home Living Family/patient expects to be discharged to:: Private residence Living Arrangements: Spouse/significant other Available Help at Discharge: Family;Available 24 hours/day;Other (Comment)(daughter) Type of Home: House Home Access: Stairs to enter [employer] of Steps: 3 Entrance Stairs-Rails: Right Home Layout: One level     Bathroom Shower/Tub: Occupational psychologist: Handicapped height     Home Equipment: Environmental consultant - 2 wheels;Shower seat   Additional Comments: sink next to toilet      Prior Functioning/Environment Level of Independence: Independent                 OT Problem List: Decreased activity tolerance;Impaired balance (sitting and/or standing);Decreased knowledge of precautions;Decreased knowledge of use of DME or AE;Pain      OT Treatment/Interventions: Self-care/ADL training;DME and/or AE instruction;Therapeutic activities;Patient/family education;Balance training    OT Goals(Current goals can be found in the care plan section) Acute Rehab OT Goals Patient Stated Goal: improve walking  OT Goal Formulation: With patient/family Time For Goal Achievement:  11/15/19 Potential to Achieve Goals: Good ADL Goals Pt Will Perform Upper Body Dressing: with set-up;sitting Pt Will Perform Lower Body Dressing: with modified independence;sit to/from stand Pt Will Transfer to Toilet: with modified independence;ambulating Pt Will Perform Toileting - Clothing Manipulation and hygiene: with modified independence;sit to/from stand Additional ADL Goal #1: Pt will complete bed mobility at supervision level to prepare for OOB ADLs.  OT Frequency: Min 3X/week   Barriers to D/C:            Co-evaluation              AM-PAC OT "6 Clicks" Daily Activity     Outcome Measure Help from another person eating meals?: None Help from another person taking care of personal grooming?: A Little Help from another person toileting, which includes using toliet, bedpan, or urinal?: A Little Help from another person bathing (including washing, rinsing, drying)?: A Little Help from another person to put on and taking off regular upper body clothing?: A Little Help from another person to put on and taking off regular lower body clothing?: A Little 6 Click Score: 19   End of Session Equipment Utilized During Treatment: Rolling walker;Other (comment)(abdominal binder)  Activity Tolerance: Patient tolerated treatment well Patient left: in chair;with call bell/phone within reach;with family/visitor present  OT Visit Diagnosis: Unsteadiness on feet (R26.81);Pain;Muscle weakness (generalized) (M62.81)                Time: 1457-1530 OT Time Calculation (min): 33 min Charges:  OT General Charges $OT Visit: 1 Visit OT Evaluation $OT Eval Low Complexity: 1 Low OT Treatments $  Self Care/Home Management : 8-22 mins  Wesley [name], OT Acute Rehabilitation Services Pager: [phone] Office: [phone]   Wesley [name] 11/01/2019, 4:12 PM

## 2019-11-01 NOTE — Progress Notes (Signed)
11/01/2019 PT evaluation complete, full note to follow.  Pt was able to walk a good distance down the hallway with RW, educated re: abdominal precautions and has great family support.  I recommend HHPT follow up at discharge.  Sounds like he has all the equipment he needs as his wife has had recent TKA.    Thanks,  Verdene Lennert, PT, DPT  Acute Rehabilitation [phone] pager [phone] office

## 2019-11-01 NOTE — Progress Notes (Addendum)
PROGRESS NOTE  Wesley [name]  [id number] DOB: [birth year] DOA: 10/28/2019 PCP: Deland Pretty, MD   Brief Narrative: Wesley [name] is a 78 y.o. male with a history of CAD, HTN, HLD, OSA, BPH, GERD, and a couple months of abdominal pain, poor per oral intake, fatigue culminating in several days of constipation and abdominal distention. In the ED, a CT of the abdomen demonstrated distal partial obstruction with possible mass. Ht was admitted for colonoscopy 3/17. This showed obstructive mass with poor prep. Biopsy taken and ultimately showed only inflammation without dysplasia. Abdomen remains distended with clinical evidence of obstruction. General surgery performed sigmoid colectomy with end colostomy following urology placement of bilateral ureteral stents on 3/19.  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 Initial biopsy results from 3/17 showing inflammation without dysplasia. Now s/p sigmoid colectomy with end colostomy, Hartman's pouch on 3/19 by Dr. Brantley Stage. Had preceding bilateral ureteral catheter placement by Dr. Diona Fanti. - Post operative management of diet, NG tube, wound, and pain per general surgery. Continuing IVF. metabolic panel not resulted, will check in AM. Note reactive leukocytosis, pt remains afebrile. Will monitor, received cefotetan for surgery.  - Incentive spirometry strongly encouraged today.  - Would discuss with urology first, but feel pulling foley should be ok.  CAD, HLD: No chest pain.  - Continue statin, can likely restart aspirin soon.  HTN:  - Continue lisinopril  BPH:  - Continue finasteride  GERD:  - Continue PPI  DVT  prophylaxis: Heparin 5,000 units q8h Code Status: Full Family Communication: Daughter at bedside Disposition Plan: Per general surgery postoperatively.   Consultants:   GI  Surgery  Urology  Procedures:   Flexible sigmoidoscopy 3/17 by Dr. Hilarie Fredrickson:  Findings:      The digital rectal exam was normal.      Extensive amounts of semi-liquid semi-solid stool was found in the       rectum and in the sigmoid colon, making visualization difficult. Copious       irrigation and lavage performed.      Many small-mouthed diverticula were found in the sigmoid colon.      There is a stricture at approximately 30 cm from the dentate line. This       is in a segment of severe diverticulosis and there is a polypoid       partially obstructing mass at this location. Given the stricture and       poor preparation due to partial obstruction, the mass is difficult to       fully characterized, but appears to be partially circumferential. The       mass and stricture is rather short segment, approximately 3-4 cm in       length. No bleeding was present. Biopsies were taken on the mass and at       the stricture with a cold forceps for histology. Area well distal to       this mass was tattooed with 2 injections on opposite walls totally 4 mL       of Spot (carbon black). Of note colon above the stricture is dilated but       appears  very viable though views limited by stool which could not be       cleared. Impression:       - Stool in the rectum and in the sigmoid colon.                            Visualization limited.                           - Diverticulosis in the sigmoid colon.                           - Rule out malignancy, partially obstructing tumor                            causing narrowing as above in the sigmoid colon.                            Biopsied. Tattooed.  10/31/19: DIAGNOSTIC LAPAROSCOPY; EXPLORATORY LAPAROTOMY; SIGMOID COLECTOMY by Dr. Brantley Stage  Antimicrobials:  None     Subjective: Had large stool output right after surgery, but abdominal pain is better today with medications. Getting up some. Didn't use CPAP due to NGT last night. No fever, cough, shortness of breath, chest pain.   Objective: Vitals:   10/31/19 2010 10/31/19 2040 11/01/19 0619 11/01/19 1020  BP:  104/86 (!) 134/94 (!) 140/94  Pulse: 94 99 95 92  Resp: 15 18 19 18   Temp:  98.4 F (36.9 C) 98.7 F (37.1 C) 98.1 F (36.7 C)  TempSrc:  Oral Oral Oral  SpO2: 98% 95% 94% 96%  Weight:      Height:        Intake/Output Summary (Last 24 hours) at 11/01/2019 1334 Last data filed at 11/01/2019 1034 Gross per 24 hour  Intake 1600 ml  Output 3740 ml  Net -2140 ml   Filed Weights   10/29/19 1104  Weight: 81.6 kg   Gen: 79 y.o. male in no distress Pulm: Nonlabored breathing room air. Clear. CV: Regular rate and rhythm. No murmur, rub, or gallop. No JVD, no dependent edema. GI: Abdomen soft, appropriately diffusely tender, non-distended, with normoactive bowel sounds. Ostomy with minimal tenderness, no exudate, c/d/i.  Ext: Warm, no deformities Skin: No new rashes, lesions or ulcers on visualized skin. Neuro: Alert and oriented. No focal neurological deficits. Psych: Judgement and insight appear fair. Mood euthymic & affect congruent. Behavior is appropriate.    Data Reviewed: I have personally reviewed following labs and imaging studies  CBC: Recent Labs  Lab 10/28/19 1552 10/29/19 0507 11/01/19 0755  WBC 8.6 9.9 16.3*  NEUTROABS 6.8  --   --   HGB 14.4 16.8 16.7  HCT 42.9 50.8 50.2  MCV 98.8 97.7 98.0  PLT 200 275 [id number]   Basic Metabolic Panel: Recent Labs  Lab 10/28/19 1552 10/29/19 0507  NA 139 136  K 3.0* 4.6  CL 107 99  CO2 21* 26  GLUCOSE 110* 143*  BUN 14 13  CREATININE 0.57* 0.86  CALCIUM 7.7* 9.3   GFR: Estimated Creatinine Clearance: 68.5 mL/min (by C-G formula based on SCr of 0.86 mg/dL). Liver Function Tests: Recent Labs  Lab 10/28/19 1552  10/29/19 0507  AST 18 19  ALT 15 15  ALKPHOS 72 80  BILITOT 0.9 0.6  PROT 6.2* 7.4  ALBUMIN 3.4* 3.9   Recent Labs  Lab 10/28/19 1552  LIPASE 20   No results for input(s): AMMONIA in the last 168 hours. Coagulation Profile: No results for input(s): INR, PROTIME in the last 168 hours. Cardiac Enzymes: No results for input(s): CKTOTAL, CKMB, CKMBINDEX, TROPONINI in the last 168 hours. BNP (last 3 results) No results for input(s): PROBNP in the last 8760 hours. HbA1C: Recent Labs    10/30/19 0826  HGBA1C 5.6   CBG: No results for input(s): GLUCAP in the last 168 hours. Lipid Profile: No results for input(s): CHOL, HDL, LDLCALC, TRIG, CHOLHDL, LDLDIRECT in the last 72 hours. Thyroid Function Tests: No results for input(s): TSH, T4TOTAL, FREET4, T3FREE, THYROIDAB in the last 72 hours. Anemia Panel: No results for input(s): VITAMINB12, FOLATE, FERRITIN, TIBC, IRON, RETICCTPCT in the last 72 hours. Urine analysis:    Component Value Date/Time   COLORURINE YELLOW 10/28/2019 1800   APPEARANCEUR CLEAR 10/28/2019 1800   LABSPEC 1.034 (H) 10/28/2019 1800   PHURINE 5.0 10/28/2019 1800   GLUCOSEU NEGATIVE 10/28/2019 1800   HGBUR SMALL (A) 10/28/2019 1800   BILIRUBINUR NEGATIVE 10/28/2019 1800   KETONESUR 20 (A) 10/28/2019 1800   PROTEINUR NEGATIVE 10/28/2019 1800   NITRITE NEGATIVE 10/28/2019 1800   LEUKOCYTESUR NEGATIVE 10/28/2019 1800   Recent Results (from the past 240 hour(s))  Respiratory Panel by RT PCR (Flu A&B, Covid) - Nasopharyngeal Swab     Status: None   Collection Time: 10/28/19  7:17 PM   Specimen: Nasopharyngeal Swab  Result Value Ref Range Status   SARS Coronavirus 2 by RT PCR NEGATIVE NEGATIVE Final    Comment: (NOTE) SARS-CoV-2 target nucleic acids are NOT DETECTED. The SARS-CoV-2 RNA is generally detectable in upper respiratoy specimens during the acute phase of infection. The lowest concentration of SARS-CoV-2 viral copies this assay can detect  is 131 copies/mL. A negative result does not preclude SARS-Cov-2 infection and should not be used as the sole basis for treatment or other patient management decisions. A negative result may occur with  improper specimen collection/handling, submission of specimen other than nasopharyngeal swab, presence of viral mutation(s) within the areas targeted by this assay, and inadequate number of viral copies (<131 copies/mL). A negative result must be combined with clinical observations, patient history, and epidemiological information. The expected result is Negative. Fact Sheet for Patients:  PinkCheek.be Fact Sheet for Healthcare Providers:  GravelBags.it This test is not yet ap proved or cleared by the [place] FDA and  has been authorized for detection and/or diagnosis of SARS-CoV-2 by FDA under an Emergency Use Authorization (EUA). This EUA will remain  in effect (meaning this test can be used) for the duration of the COVID-19 declaration under Section 564(b)(1) of the Act, 21 U.S.C. section 360bbb-3(b)(1), unless the authorization is terminated or revoked sooner.    Influenza A by PCR NEGATIVE NEGATIVE Final   Influenza B by PCR NEGATIVE NEGATIVE Final    Comment: (NOTE) The Xpert Xpress SARS-CoV-2/FLU/RSV assay is intended as an aid in  the diagnosis of influenza from Nasopharyngeal swab specimens and  should not be used as a sole basis for treatment. Nasal washings and  aspirates are unacceptable for Xpert Xpress SARS-CoV-2/FLU/RSV  testing. Fact Sheet for Patients: PinkCheek.be Fact Sheet for Healthcare Providers: GravelBags.it This test is not yet approved or cleared by the [place] FDA and  has been authorized for detection and/or diagnosis of SARS-CoV-2 by  FDA under an Emergency Use Authorization (EUA).  This EUA will remain  in effect (meaning this  test can be used) for the duration of the  Covid-19 declaration under Section 564(b)(1) of the Act, 21  U.S.C. section 360bbb-3(b)(1), unless the authorization is  terminated or revoked. Performed at [facility], [place] [address], [place], [place] [address]   Surgical pcr screen     Status: None   Collection Time: 10/30/19 10:35 PM   Specimen: Nasal Mucosa; Nasal Swab  Result Value Ref Range Status   MRSA, PCR NEGATIVE NEGATIVE Final   Staphylococcus aureus NEGATIVE NEGATIVE Final    Comment: (NOTE) The Xpert SA Assay (FDA approved for NASAL specimens in patients 5 years of age and older), is one component of a comprehensive surveillance program. It is not intended to diagnose infection nor to guide or monitor treatment. Performed at [facility], [place] [address] [place], [place] [address]       Radiology Studies: No results found.  Scheduled Meds: . atorvastatin  80 mg Oral q1800  . Chlorhexidine Gluconate Cloth  6 each Topical Daily  . finasteride  5 mg Oral Daily  . lisinopril  10 mg Oral Daily  . loratadine  10 mg Oral Daily  . vitamin B-12  1,000 mcg Oral Daily   Continuous Infusions: . sodium chloride    . dextrose 5 % and 0.45% NaCl 100 mL/hr at 11/01/19 0812     LOS: 4 days   Time spent: 25 minutes.  Patrecia Pour, MD [facility] [email or web address] 11/01/2019, 1:34 PM

## 2019-11-01 NOTE — Evaluation (Signed)
Physical Therapy Evaluation Patient Details Name: Wesley [name] MRN: [id number] DOB: 10-31-1940 Today's Date: 11/01/2019   History of Present Illness  79 y.o. male with a history of CAD, HTN, HLD, OSA, BPH, GERD, and a couple months of abdominal pain, poor per oral intake, fatigue culminating in several days of constipation and abdominal distention. In the ED, a CT of the abdomen demonstrated distal partial obstruction with possible mass. Ht was admitted for colonoscopy 3/17. This showed obstructive mass with poor prep. Biopsy taken and ultimately showed only inflammation without dysplasia. General surgery performed sigmoid colectomy with end colostomy following urology placement of bilateral ureteral stents on 3/19.  Clinical Impression  Pt was able to walk to the RN desk and back with RW, cues for upright posture, to check his bag before mobilizing and for precautions to protect his abdomen against pain.  He is motivated to walk and get home when he is allowed. PT will continue to follow acutely for safe mobility progression and deficits listed below.     Follow Up Recommendations Home health PT    Equipment Recommendations  None recommended by PT    Recommendations for Other Services   NA    Precautions / Restrictions Precautions Precautions: Fall Precaution Comments: NG tube, colostomy LLQ      Mobility  Bed Mobility Overal bed mobility: Needs Assistance Bed Mobility: Rolling;Sidelying to Sit;Sit to Sidelying Rolling: Min guard Sidelying to sit: Min assist     Sit to sidelying: Mod assist General bed mobility comments: Pt able to roll to his side to protect his abdomen using his railing and min guard assist, min assist needed to support his trunk to come up to sitting EOB and mod assist needed to lift bil Legs back into bed to return to supine.   Transfers Overall transfer level: Needs assistance Equipment used: Rolling walker (2 wheeled) Transfers: Sit to/from  Stand Sit to Stand: Min guard         General transfer comment: Min guard assist for safety cues for safe hand placement and to check his ostomy back for air and for fullness prior to ambulating.   Ambulation/Gait Ambulation/Gait assistance: Min guard Gait Distance (Feet): 150 Feet Assistive device: Rolling walker (2 wheeled) Gait Pattern/deviations: Step-through pattern;Shuffle;Trunk flexed Gait velocity: decreased Gait velocity interpretation: 1.31 - 2.62 ft/sec, indicative of limited community ambulator General Gait Details: Pt with shuffing, flexed trunk posture, attempting to correct, but painful pulling with upright trunk.           Balance Overall balance assessment: Needs assistance Sitting-balance support: Feet supported;No upper extremity supported Sitting balance-Leahy Scale: Good     Standing balance support: Bilateral upper extremity supported;Single extremity supported Standing balance-Leahy Scale: Fair                               Pertinent Vitals/Pain Pain Assessment: Faces Faces Pain Scale: Hurts even more Pain Location: abdomen Pain Descriptors / Indicators: Discomfort;Constant;Guarding Pain Intervention(s): Limited activity within patient's tolerance;Monitored during session;Repositioned    Home Living Family/patient expects to be discharged to:: Private residence Living Arrangements: Spouse/significant other Available Help at Discharge: Family;Available 24 hours/day;Other (Comment) Type of Home: House Home Access: Stairs to enter Entrance Stairs-Rails: Right Entrance Stairs-Number of Steps: 3 Home Layout: One level Home Equipment: Walker - 2 wheels;Shower seat Additional Comments: sink next to toilet    Prior Function Level of Independence: Independent  Hand Dominance   Dominant Hand: Right    Extremity/Trunk Assessment   Upper Extremity Assessment Upper Extremity Assessment: Defer to OT evaluation     Lower Extremity Assessment Lower Extremity Assessment: Generalized weakness    Cervical / Trunk Assessment Cervical / Trunk Assessment: Normal  Communication   Communication: No difficulties  Cognition Arousal/Alertness: Awake/alert Behavior During Therapy: WFL for tasks assessed/performed Overall Cognitive Status: Within Functional Limits for tasks assessed                                               Assessment/Plan    PT Assessment Patient needs continued PT services  PT Problem List Decreased strength;Decreased activity tolerance;Decreased balance;Decreased mobility;Decreased knowledge of use of DME;Decreased knowledge of precautions;Pain       PT Treatment Interventions DME instruction;Gait training;Stair training;Functional mobility training;Therapeutic activities;Therapeutic exercise;Balance training;Patient/family education    PT Goals (Current goals can be found in the Care Plan section)  Acute Rehab PT Goals Patient Stated Goal: improve walking  PT Goal Formulation: With patient Time For Goal Achievement: 11/15/19 Potential to Achieve Goals: Good    Frequency Min 3X/week    AM-PAC PT "6 Clicks" Mobility  Outcome Measure Help needed turning from your back to your side while in a flat bed without using bedrails?: A Little Help needed moving from lying on your back to sitting on the side of a flat bed without using bedrails?: A Little Help needed moving to and from a bed to a chair (including a wheelchair)?: A Little Help needed standing up from a chair using your arms (e.g., wheelchair or bedside chair)?: A Little Help needed to walk in hospital room?: A Little Help needed climbing 3-5 steps with a railing? : A Little 6 Click Score: 18    End of Session   Activity Tolerance: Patient limited by pain Patient left: in bed;with call bell/phone within reach;with family/visitor present   PT Visit Diagnosis: Muscle weakness (generalized)  (M62.81);Difficulty in walking, not elsewhere classified (R26.2);Pain Pain - Right/Left: Left Pain - part of body: (abdomen)          PT Time Calculation  PT Start Time (ACUTE ONLY) 1708  PT Stop Time (ACUTE ONLY) 1733  PT Time Calculation (min) (ACUTE ONLY) 25 min  PT General Charges  $$ ACUTE PT VISIT 1 Visit  PT Evaluation  $PT Eval Moderate Complexity 1 Mod  PT Treatments  $Gait Training 8-22 mins     Verdene Lennert, PT, DPT  Acute Rehabilitation [phone] pager [phone]) [phone] office

## 2019-11-02 DIAGNOSIS — Z933 Colostomy status: Secondary | ICD-10-CM

## 2019-11-02 LAB — COMPREHENSIVE METABOLIC PANEL
ALT: 21 U/L (ref 0–44)
AST: 23 U/L (ref 15–41)
Albumin: 2.6 g/dL — ABNORMAL LOW (ref 3.5–5.0)
Alkaline Phosphatase: 52 U/L (ref 38–126)
Anion gap: 7 (ref 5–15)
BUN: 14 mg/dL (ref 8–23)
CO2: 28 mmol/L (ref 22–32)
Calcium: 7.9 mg/dL — ABNORMAL LOW (ref 8.9–10.3)
Chloride: 97 mmol/L — ABNORMAL LOW (ref 98–111)
Creatinine, Ser: 0.68 mg/dL (ref 0.61–1.24)
GFR calc Af Amer: >60 mL/min (ref >60)
GFR calc non Af Amer: >60 mL/min (ref >60)
Glucose, Bld: 117 mg/dL — ABNORMAL HIGH (ref 70–99)
Potassium: 3.6 mmol/L (ref 3.5–5.1)
Sodium: 132 mmol/L — ABNORMAL LOW (ref 135–145)
Total Bilirubin: 1.2 mg/dL (ref 0.3–1.2)
Total Protein: 5.3 g/dL — ABNORMAL LOW (ref 6.5–8.1)

## 2019-11-02 LAB — CBC
HCT: 44.1 % (ref 39.0–52.0)
Hemoglobin: 14.7 g/dL (ref 13.0–17.0)
MCH: 32.8 pg (ref 26.0–34.0)
MCHC: 33.3 g/dL (ref 30.0–36.0)
MCV: 98.4 fL (ref 80.0–100.0)
Platelets: 242 10*3/uL (ref 150–400)
RBC: 4.48 MIL/uL (ref 4.22–5.81)
RDW: 13.3 % (ref 11.5–15.5)
WBC: 15.6 10*3/uL — ABNORMAL HIGH (ref 4.0–10.5)
nRBC: 0 % (ref 0.0–0.2)

## 2019-11-02 NOTE — Progress Notes (Signed)
PROGRESS NOTE  Wesley [name]  [id number] DOB: [birth year] DOA: 10/28/2019 PCP: Deland Pretty, MD   Brief Narrative: Wesley [name] is a 79 y.o. male with a history of CAD, HTN, HLD, OSA, BPH, GERD, and a couple months of abdominal pain, poor per oral intake, fatigue culminating in several days of constipation and abdominal distention. In the ED, a CT of the abdomen demonstrated distal partial obstruction with possible mass. Ht was admitted for colonoscopy 3/17. This showed obstructive mass with poor prep. Biopsy taken and ultimately showed only inflammation without dysplasia. Abdomen remains distended with clinical evidence of obstruction. General surgery performed sigmoid colectomy with end colostomy on 3/19.   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 Initial biopsy results from 3/17 showing inflammation without dysplasia. Now s/p sigmoid colectomy with end colostomy, Hartman's pouch on 3/19 by Dr. Brantley Stage. Had preceding bilateral ureteral catheter placement by Dr. Diona Fanti. - Post operative management of diet, NG tube, wound, and pain per general surgery.  - Continuing IVF.  - Note reactive leukocytosis improving, pt remains afebrile. Will monitor, received cefotetan for surgery.  - Incentive spirometry strongly encouraged, and OOB. - Follow up with GI who recommends colonoscopy prior to reanastomosis.  CAD, HLD: No chest pain.  - Continue statin, can likely restart aspirin soon.  HTN:  - Continue lisinopril  BPH:  - Continue finasteride  GERD:  - Continue PPI  DVT prophylaxis: Heparin 5,000 units q8h Code Status: Full Family Communication: None at  bedside. Wife and daughter updated by phone with GI. Disposition Plan: Per general surgery postoperatively.   Consultants:   GI  Surgery  Urology  Procedures:   Flexible sigmoidoscopy 3/17 by Dr. Hilarie Fredrickson:  Findings:      The digital rectal exam was normal.      Extensive amounts of semi-liquid semi-solid stool was found in the       rectum and in the sigmoid colon, making visualization difficult. Copious       irrigation and lavage performed.      Many small-mouthed diverticula were found in the sigmoid colon.      There is a stricture at approximately 30 cm from the dentate line. This       is in a segment of severe diverticulosis and there is a polypoid       partially obstructing mass at this location. Given the stricture and       poor preparation due to partial obstruction, the mass is difficult to       fully characterized, but appears to be partially circumferential. The       mass and stricture is rather short segment, approximately 3-4 cm in       length. No bleeding was present. Biopsies were taken on the mass and at       the stricture with a cold forceps for histology. Area well distal to       this mass was tattooed with 2 injections on opposite walls totally 4 mL       of Spot (carbon black). Of note colon above the stricture is dilated but       appears very viable though  views limited by stool which could not be       cleared. Impression:       - Stool in the rectum and in the sigmoid colon.                            Visualization limited.                           - Diverticulosis in the sigmoid colon.                           - Rule out malignancy, partially obstructing tumor                            causing narrowing as above in the sigmoid colon.                            Biopsied. Tattooed.  10/31/19: DIAGNOSTIC LAPAROSCOPY; EXPLORATORY LAPAROTOMY; SIGMOID COLECTOMY by Dr. Brantley Stage  Antimicrobials:  None   Subjective: Pain is moderate, constant,  improved with pain medications. He's eager to have NGT out. Having regular output.  Objective: Vitals:   11/01/19 1417 11/01/19 1804 11/01/19 2110 11/02/19 0604  BP: (!) 125/92 (!) 143/91 (!) 139/107 (!) 147/105  Pulse: (!) 101 99 (!) 110 (!) 101  Resp: 18 18 17 18   Temp: 98.2 F (36.8 C) 98.5 F (36.9 C) 98.6 F (37 C) 98.2 F (36.8 C)  TempSrc: Oral Oral Oral Oral  SpO2: 97% 95% 93% 96%  Weight:      Height:        Intake/Output Summary (Last 24 hours) at 11/02/2019 1238 Last data filed at 11/02/2019 [id number] Gross per 24 hour  Intake 240 ml  Output 375 ml  Net -135 ml   Filed Weights   10/29/19 1104  Weight: 81.6 kg   Gen: 79 y.o. male in no distress Pulm: Nonlabored breathing room air. Clear. CV: Regular rate and rhythm. No murmur, rub, or gallop. No JVD, no dependent edema. GI: Abdomen soft, minimally tender, non-distended, with normoactive bowel sounds.  Ext: Warm, no deformities Skin: Ostomy appears without erythema or discharge. No new rashes, lesions or ulcers on visualized skin. Neuro: Alert and oriented. No focal neurological deficits. Psych: Judgement and insight appear fair. Mood euthymic & affect congruent. Behavior is appropriate.    Data Reviewed: I have personally reviewed following labs and imaging studies  CBC: Recent Labs  Lab 10/28/19 1552 10/29/19 0507 11/01/19 0755 11/02/19 0541  WBC 8.6 9.9 16.3* 15.6*  NEUTROABS 6.8  --   --   --   HGB 14.4 16.8 16.7 14.7  HCT 42.9 50.8 50.2 44.1  MCV 98.8 97.7 98.0 98.4  PLT 200 275 248 [id number]   Basic Metabolic Panel: Recent Labs  Lab 10/28/19 1552 10/29/19 0507 11/02/19 0541  NA 139 136 132*  K 3.0* 4.6 3.6  CL 107 99 97*  CO2 21* 26 28  GLUCOSE 110* 143* 117*  BUN 14 13 14   CREATININE 0.57* 0.86 0.68  CALCIUM 7.7* 9.3 7.9*   GFR: Estimated Creatinine Clearance: 73.6 mL/min (by C-G formula based on SCr of 0.68 mg/dL). Liver Function Tests: Recent Labs  Lab 10/28/19 1552 10/29/19 0507  11/02/19 0541  AST 18 19 23   ALT 15 15 21  ALKPHOS 72 80 52  BILITOT 0.9 0.6 1.2  PROT 6.2* 7.4 5.3*  ALBUMIN 3.4* 3.9 2.6*   Recent Labs  Lab 10/28/19 1552  LIPASE 20   No results for input(s): AMMONIA in the last 168 hours. Coagulation Profile: No results for input(s): INR, PROTIME in the last 168 hours. Cardiac Enzymes: No results for input(s): CKTOTAL, CKMB, CKMBINDEX, TROPONINI in the last 168 hours. BNP (last 3 results) No results for input(s): PROBNP in the last 8760 hours. HbA1C: No results for input(s): HGBA1C in the last 72 hours. CBG: No results for input(s): GLUCAP in the last 168 hours. Lipid Profile: No results for input(s): CHOL, HDL, LDLCALC, TRIG, CHOLHDL, LDLDIRECT in the last 72 hours. Thyroid Function Tests: No results for input(s): TSH, T4TOTAL, FREET4, T3FREE, THYROIDAB in the last 72 hours. Anemia Panel: No results for input(s): VITAMINB12, FOLATE, FERRITIN, TIBC, IRON, RETICCTPCT in the last 72 hours. Urine analysis:    Component Value Date/Time   COLORURINE YELLOW 10/28/2019 1800   APPEARANCEUR CLEAR 10/28/2019 1800   LABSPEC 1.034 (H) 10/28/2019 1800   PHURINE 5.0 10/28/2019 1800   GLUCOSEU NEGATIVE 10/28/2019 1800   HGBUR SMALL (A) 10/28/2019 1800   BILIRUBINUR NEGATIVE 10/28/2019 1800   KETONESUR 20 (A) 10/28/2019 1800   PROTEINUR NEGATIVE 10/28/2019 1800   NITRITE NEGATIVE 10/28/2019 1800   LEUKOCYTESUR NEGATIVE 10/28/2019 1800   Recent Results (from the past 240 hour(s))  Respiratory Panel by RT PCR (Flu A&B, Covid) - Nasopharyngeal Swab     Status: None   Collection Time: 10/28/19  7:17 PM   Specimen: Nasopharyngeal Swab  Result Value Ref Range Status   SARS Coronavirus 2 by RT PCR NEGATIVE NEGATIVE Final    Comment: (NOTE) SARS-CoV-2 target nucleic acids are NOT DETECTED. The SARS-CoV-2 RNA is generally detectable in upper respiratoy specimens during the acute phase of infection. The lowest concentration of SARS-CoV-2 viral  copies this assay can detect is 131 copies/mL. A negative result does not preclude SARS-Cov-2 infection and should not be used as the sole basis for treatment or other patient management decisions. A negative result may occur with  improper specimen collection/handling, submission of specimen other than nasopharyngeal swab, presence of viral mutation(s) within the areas targeted by this assay, and inadequate number of viral copies (<131 copies/mL). A negative result must be combined with clinical observations, patient history, and epidemiological information. The expected result is Negative. Fact Sheet for Patients:  PinkCheek.be Fact Sheet for Healthcare Providers:  GravelBags.it This test is not yet ap proved or cleared by the [place] FDA and  has been authorized for detection and/or diagnosis of SARS-CoV-2 by FDA under an Emergency Use Authorization (EUA). This EUA will remain  in effect (meaning this test can be used) for the duration of the COVID-19 declaration under Section 564(b)(1) of the Act, 21 U.S.C. section 360bbb-3(b)(1), unless the authorization is terminated or revoked sooner.    Influenza A by PCR NEGATIVE NEGATIVE Final   Influenza B by PCR NEGATIVE NEGATIVE Final    Comment: (NOTE) The Xpert Xpress SARS-CoV-2/FLU/RSV assay is intended as an aid in  the diagnosis of influenza from Nasopharyngeal swab specimens and  should not be used as a sole basis for treatment. Nasal washings and  aspirates are unacceptable for Xpert Xpress SARS-CoV-2/FLU/RSV  testing. Fact Sheet for Patients: PinkCheek.be Fact Sheet for Healthcare Providers: GravelBags.it This test is not yet approved or cleared by the [place] FDA and  has been authorized for detection and/or diagnosis of  SARS-CoV-2 by  FDA under an Emergency Use Authorization (EUA). This EUA will  remain  in effect (meaning this test can be used) for the duration of the  Covid-19 declaration under Section 564(b)(1) of the Act, 21  U.S.C. section 360bbb-3(b)(1), unless the authorization is  terminated or revoked. Performed at [facility], [place] [address], [place], [place] [address]   Surgical pcr screen     Status: None   Collection Time: 10/30/19 10:35 PM   Specimen: Nasal Mucosa; Nasal Swab  Result Value Ref Range Status   MRSA, PCR NEGATIVE NEGATIVE Final   Staphylococcus aureus NEGATIVE NEGATIVE Final    Comment: (NOTE) The Xpert SA Assay (FDA approved for NASAL specimens in patients 43 years of age and older), is one component of a comprehensive surveillance program. It is not intended to diagnose infection nor to guide or monitor treatment. Performed at [facility], [place] [address], [place], [place] [address]       Radiology Studies: No results found.  Scheduled Meds: . atorvastatin  80 mg Oral q1800  . Chlorhexidine Gluconate Cloth  6 each Topical Daily  . finasteride  5 mg Oral Daily  . lisinopril  10 mg Oral Daily  . loratadine  10 mg Oral Daily  . vitamin B-12  1,000 mcg Oral Daily   Continuous Infusions: . sodium chloride    . dextrose 5 % and 0.45% NaCl 75 mL/hr at 11/02/19 0936     LOS: 5 days   Time spent: 25 minutes.  Patrecia Pour, MD [facility] [email or web address] 11/02/2019, 12:38 PM

## 2019-11-02 NOTE — Progress Notes (Signed)
  Subjective/Chief Complaint: Pain is under control NG tube in place - minimal output;  Foley - hematuria     Objective: Vital signs in last 24 hours: Temp:  [98.1 F (36.7 C)-98.6 F (37 C)] 98.2 F (36.8 C) (03/21 0604) Pulse Rate:  [92-110] 101 (03/21 0604) Resp:  [17-18] 18 (03/21 0604) BP: (125-147)/(91-107) 147/105 (03/21 0604) SpO2:  [93 %-97 %] 96 % (03/21 0604) Last BM Date: 10/28/19  Intake/Output from previous day: 03/20 0701 - 03/21 0700 In: 240 [P.O.:240] Out: 625 [Urine:625] Intake/Output this shift: No intake/output data recorded.  General appearance: alert, cooperative and no distress GI: soft, incisional tenderness; Ostomy pink - minimal output Foley - blood-tinged;   Lab Results:  Recent Labs    11/01/19 0755 11/02/19 0541  WBC 16.3* 15.6*  HGB 16.7 14.7  HCT 50.2 44.1  PLT 248 242   BMET Recent Labs    11/02/19 0541  NA 132*  K 3.6  CL 97*  CO2 28  GLUCOSE 117*  BUN 14  CREATININE 0.68  CALCIUM 7.9*   PT/INR No results for input(s): LABPROT, INR in the last 72 hours. ABG No results for input(s): PHART, HCO3 in the last 72 hours.  Invalid input(s): PCO2, PO2  Studies/Results: No results found.  Anti-infectives: Anti-infectives (From admission, onward)   Start     Dose/Rate Route Frequency Ordered Stop   10/31/19 0600  cefoTEtan (CEFOTAN) 2 g in sodium chloride 0.9 % 100 mL IVPB     2 g 200 mL/hr over 30 Minutes Intravenous On call to O.R. 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

## 2019-11-02 NOTE — Progress Notes (Signed)
2 Days Post-Op   Subjective/Chief Complaint: Tolerating clear liquids with NG clamped Voiding well after Foley removed Pain well-controlled Not sleeping well without CPAP   Objective: Vital signs in last 24 hours: Temp:  [98.1 F (36.7 C)-98.6 F (37 C)] 98.2 F (36.8 C) (03/21 0604) Pulse Rate:  [92-110] 101 (03/21 0604) Resp:  [17-18] 18 (03/21 0604) BP: (125-147)/(91-107) 147/105 (03/21 0604) SpO2:  [93 %-97 %] 96 % (03/21 0604) Last BM Date: 10/28/19  Intake/Output from previous day: 03/20 0701 - 03/21 0700 In: 240 [P.O.:240] Out: 625 [Urine:625] Intake/Output this shift: No intake/output data recorded.  General appearance: alert, cooperative and no distress GI: soft, incisional tenderness; ostomy pink with stool in bag Incision - honeycomb; minimal dried drainage  Lab Results:  Recent Labs    11/01/19 0755 11/02/19 0541  WBC 16.3* 15.6*  HGB 16.7 14.7  HCT 50.2 44.1  PLT 248 242   BMET Recent Labs    11/02/19 0541  NA 132*  K 3.6  CL 97*  CO2 28  GLUCOSE 117*  BUN 14  CREATININE 0.68  CALCIUM 7.9*   PT/INR No results for input(s): LABPROT, INR in the last 72 hours. ABG No results for input(s): PHART, HCO3 in the last 72 hours.  Invalid input(s): PCO2, PO2  Studies/Results: No results found.  Anti-infectives: Anti-infectives (From admission, onward)   Start     Dose/Rate Route Frequency Ordered Stop   10/31/19 0600  cefoTEtan (CEFOTAN) 2 g in sodium chloride 0.9 % 100 mL IVPB     2 g 200 mL/hr over 30 Minutes Intravenous On call to O.R. 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

## 2019-11-02 NOTE — Progress Notes (Signed)
    Progress Note   Subjective  Recovering on postoperative day 2 after sigmoid resection for obstruction NG tube has been removed He is tolerating clear liquids Ostomy beginning to function He has been out of bed walking Did not sleep well without CPAP Foley is out   Objective  Vital signs in last 24 hours: Temp:  [98.2 F (36.8 C)-98.6 F (37 C)] 98.2 F (36.8 C) (03/21 0604) Pulse Rate:  [99-110] 101 (03/21 0604) Resp:  [17-18] 18 (03/21 0604) BP: (125-147)/(91-107) 147/105 (03/21 0604) SpO2:  [93 %-97 %] 96 % (03/21 0604) Last BM Date: 10/28/19 Gen: awake, alert, NAD HEENT: anicteric, op clear CV: RRR, no mrg Pulm: CTA b/l Abd: much softer, ostomy in place with scant dark brown stool, bowel sounds are present Ext: no c/c/e Neuro: nonfocal   Intake/Output from previous day: 03/20 0701 - 03/21 0700 In: 240 [P.O.:240] Out: 625 [Urine:625] Intake/Output this shift: Total I/O In: -  Out: 150 [Urine:150]  Lab Results: Recent Labs    11/01/19 0755 11/02/19 0541  WBC 16.3* 15.6*  HGB 16.7 14.7  HCT 50.2 44.1  PLT 248 242   BMET Recent Labs    11/02/19 0541  NA 132*  K 3.6  CL 97*  CO2 28  GLUCOSE 117*  BUN 14  CREATININE 0.68  CALCIUM 7.9*   LFT Recent Labs    11/02/19 0541  PROT 5.3*  ALBUMIN 2.6*  AST 23  ALT 21  ALKPHOS 69  BILITOT 1.2     Assessment & Recommendations  79 year old male presenting with partial to near complete colonic obstruction in the sigmoid status post sigmoid resection, postoperative day #2  1.  Partial/near complete colonic obstruction --recovering after sigmoidectomy with descending colostomy.  Initial inspection in the operating room suggested benign stricture, final path pending.  He is doing well so far postop --I will arrange GI follow-up in 4 to 6 weeks.  At that point we can schedule colonoscopy via the ostomy, but also checking rectal stump.  I recommended to him that he should have colonoscopy before ostomy  takedown and reanastomosis. --Postoperative management per surgical team --Operative findings encouraging without obvious malignancy though pathology pending  Discussed with the patient but also his wife and daughter by speaker phone  GI will sign off, call with questions    LOS: 5 days   Jerene Bears  11/02/2019, 10:26 AM

## 2019-11-02 NOTE — Consult Note (Signed)
[facility] Nurse ostomy consult note Called patient room and spoke to his wife, Ledarrius Rosati.  Appointment made for 10:30am on Monday, 11/03/19 for first pouch change/teaching session. I will plan to stop by the patient's room in the morning and assess ostomy size, location and order appropriate supplies for the session.  [place] nursing team will follow along, and will remain available to this patient, his family and the nursing, surgical and medical teams.    Thanks, Maudie Flakes, MSN, RN, [employer], Arther Abbott  Pager# [phone]

## 2019-11-03 LAB — CBC
HCT: 43.2 % (ref 39.0–52.0)
Hemoglobin: 14.2 g/dL (ref 13.0–17.0)
MCH: 32.6 pg (ref 26.0–34.0)
MCHC: 32.9 g/dL (ref 30.0–36.0)
MCV: 99.3 fL (ref 80.0–100.0)
Platelets: 232 10*3/uL (ref 150–400)
RBC: 4.35 MIL/uL (ref 4.22–5.81)
RDW: 13.2 % (ref 11.5–15.5)
WBC: 12.4 10*3/uL — ABNORMAL HIGH (ref 4.0–10.5)
nRBC: 0 % (ref 0.0–0.2)

## 2019-11-03 LAB — BASIC METABOLIC PANEL
Anion gap: 6 (ref 5–15)
BUN: 11 mg/dL (ref 8–23)
CO2: 29 mmol/L (ref 22–32)
Calcium: 8.5 mg/dL — ABNORMAL LOW (ref 8.9–10.3)
Chloride: 99 mmol/L (ref 98–111)
Creatinine, Ser: 0.77 mg/dL (ref 0.61–1.24)
GFR calc Af Amer: >60 mL/min (ref >60)
GFR calc non Af Amer: >60 mL/min (ref >60)
Glucose, Bld: 94 mg/dL (ref 70–99)
Potassium: 4.5 mmol/L (ref 3.5–5.1)
Sodium: 134 mmol/L — ABNORMAL LOW (ref 135–145)

## 2019-11-03 LAB — SURGICAL PATHOLOGY

## 2019-11-03 MED ORDER — ENOXAPARIN SODIUM 40 MG/0.4ML ~~LOC~~ SOLN
40.0000 mg | SUBCUTANEOUS | Status: DC
  Administered 2019-11-03 – 2019-11-05 (×3): 40 mg via SUBCUTANEOUS
  Filled 2019-11-03 (×3): qty 0.4

## 2019-11-03 NOTE — Progress Notes (Signed)
PROGRESS NOTE  Wesley [name]  [id number] DOB: 09-20-1940 DOA: 10/28/2019 PCP: Deland Pretty, MD   Brief Narrative: Wesley [name] is a 79 y.o. male with a history of CAD, HTN, HLD, OSA, BPH, GERD, and a couple months of abdominal pain, poor per oral intake, fatigue culminating in several days of constipation and abdominal distention. In the ED, a CT of the abdomen demonstrated distal partial obstruction with possible mass. Ht was admitted for colonoscopy 3/17. This showed obstructive mass with poor prep. Biopsy taken and ultimately showed only inflammation without dysplasia. Abdomen remains distended with clinical evidence of obstruction. General surgery performed sigmoid colectomy with end colostomy on 3/19.   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 Initial biopsy results from 3/17 showing inflammation without dysplasia. Now s/p sigmoid colectomy with end colostomy, Hartman's pouch on 3/19 by Dr. Brantley Stage. Had preceding bilateral ureteral catheter placement by Dr. Diona Fanti. - Post operative management of diet, ostomy, pain per general surgery.  - [place] training today. - Can stop IVF, taking po. Advancing diet per surgery. Pulled NGT 3/21.   - Note reactive leukocytosis improving, pt remains afebrile. Received cefotetan for surgery.  - Incentive spirometry strongly encouraged, and OOB. - Follow up with GI who recommends colonoscopy prior to reanastomosis.  CAD, HLD: No chest pain.  - Continue statin, restart ASA at discharge.  HTN:  - Continue lisinopril  BPH:  - Continue finasteride  GERD:  - Continue PPI  DVT prophylaxis: Heparin 5,000 units q8h Code  Status: Full Family Communication: None at bedside this AM. Disposition Plan: Per general surgery postoperatively. Perhaps home 3/23.  Consultants:   GI  Surgery  Urology  Procedures:   Flexible sigmoidoscopy 3/17 by Dr. Hilarie Fredrickson:  Findings:      The digital rectal exam was normal.      Extensive amounts of semi-liquid semi-solid stool was found in the       rectum and in the sigmoid colon, making visualization difficult. Copious       irrigation and lavage performed.      Many small-mouthed diverticula were found in the sigmoid colon.      There is a stricture at approximately 30 cm from the dentate line. This       is in a segment of severe diverticulosis and there is a polypoid       partially obstructing mass at this location. Given the stricture and       poor preparation due to partial obstruction, the mass is difficult to       fully characterized, but appears to be partially circumferential. The       mass and stricture is rather short segment, approximately 3-4 cm in       length. No bleeding was present. Biopsies were taken on the mass and at       the stricture with a cold forceps for histology. Area well distal to       this mass was tattooed with 2 injections on opposite walls totally 4 mL       of Spot (carbon black). Of note colon above the stricture is dilated but  appears very viable though views limited by stool which could not be       cleared. Impression:       - Stool in the rectum and in the sigmoid colon.                            Visualization limited.                           - Diverticulosis in the sigmoid colon.                           - Rule out malignancy, partially obstructing tumor                            causing narrowing as above in the sigmoid colon.                            Biopsied. Tattooed.  10/31/19: DIAGNOSTIC LAPAROSCOPY; EXPLORATORY LAPAROTOMY; SIGMOID COLECTOMY by Dr. Brantley Stage  Antimicrobials:  None   Subjective: Had  a bad day yesterday, pain was significant and he's not resting well at all until last night. Didn't get up much yesterday. Using IS. No shortness of breath or chest pain. Reluctant to discharge tomorrow.  Objective: Vitals:   11/02/19 2017 11/02/19 2119 11/03/19 0548 11/03/19 1329  BP:  115/78 134/83 111/78  Pulse: 87 95 91 94  Resp: 18 18 16 18   Temp:  98.3 F (36.8 C) 98.2 F (36.8 C) 99.3 F (37.4 C)  TempSrc:  Oral Oral Oral  SpO2: 96% 97% 96% 98%  Weight:      Height:        Intake/Output Summary (Last 24 hours) at 11/03/2019 1407 Last data filed at 11/03/2019 1100 Gross per 24 hour  Intake 240 ml  Output 350 ml  Net -110 ml   Filed Weights   10/29/19 1104  Weight: 81.6 kg   Gen: 79 y.o. male in no distress Pulm: Nonlabored breathing room air. Clear. CV: Regular rate and rhythm. No murmur, rub, or gallop. No JVD, no dependent edema. GI: Abdomen soft, appropriately tender without rebound, non-distended, with normoactive bowel sounds.  Ext: Warm, no deformities Skin: No new rashes, lesions or ulcers on visualized skin. Ostomy site not examined today. Neuro: Alert and oriented. No focal neurological deficits. Psych: Judgement and insight appear fair. Mood euthymic & affect congruent. Behavior is appropriate.    Data Reviewed: I have personally reviewed following labs and imaging studies  CBC: Recent Labs  Lab 10/28/19 1552 10/29/19 0507 11/01/19 0755 11/02/19 0541 11/03/19 0522  WBC 8.6 9.9 16.3* 15.6* 12.4*  NEUTROABS 6.8  --   --   --   --   HGB 14.4 16.8 16.7 14.7 14.2  HCT 42.9 50.8 50.2 44.1 43.2  MCV 98.8 97.7 98.0 98.4 99.3  PLT 200 275 248 242 [id number]   Basic Metabolic Panel: Recent Labs  Lab 10/28/19 1552 10/29/19 0507 11/02/19 0541 11/03/19 0522  NA 139 136 132* 134*  K 3.0* 4.6 3.6 4.5  CL 107 99 97* 99  CO2 21* 26 28 29   GLUCOSE 110* 143* 117* 94  BUN 14 13 14 11   CREATININE 0.57* 0.86 0.68 0.77  CALCIUM 7.7* 9.3 7.9* 8.5*    GFR: Estimated Creatinine Clearance:  73.6 mL/min (by C-G formula based on SCr of 0.77 mg/dL). Liver Function Tests: Recent Labs  Lab 10/28/19 1552 10/29/19 0507 11/02/19 0541  AST 18 19 23   ALT 15 15 21   ALKPHOS 72 80 52  BILITOT 0.9 0.6 1.2  PROT 6.2* 7.4 5.3*  ALBUMIN 3.4* 3.9 2.6*   Recent Labs  Lab 10/28/19 1552  LIPASE 20   No results for input(s): AMMONIA in the last 168 hours. Coagulation Profile: No results for input(s): INR, PROTIME in the last 168 hours. Cardiac Enzymes: No results for input(s): CKTOTAL, CKMB, CKMBINDEX, TROPONINI in the last 168 hours. BNP (last 3 results) No results for input(s): PROBNP in the last 8760 hours. HbA1C: No results for input(s): HGBA1C in the last 72 hours. CBG: No results for input(s): GLUCAP in the last 168 hours. Lipid Profile: No results for input(s): CHOL, HDL, LDLCALC, TRIG, CHOLHDL, LDLDIRECT in the last 72 hours. Thyroid Function Tests: No results for input(s): TSH, T4TOTAL, FREET4, T3FREE, THYROIDAB in the last 72 hours. Anemia Panel: No results for input(s): VITAMINB12, FOLATE, FERRITIN, TIBC, IRON, RETICCTPCT in the last 72 hours. Urine analysis:    Component Value Date/Time   COLORURINE YELLOW 10/28/2019 1800   APPEARANCEUR CLEAR 10/28/2019 1800   LABSPEC 1.034 (H) 10/28/2019 1800   PHURINE 5.0 10/28/2019 1800   GLUCOSEU NEGATIVE 10/28/2019 1800   HGBUR SMALL (A) 10/28/2019 1800   BILIRUBINUR NEGATIVE 10/28/2019 1800   KETONESUR 20 (A) 10/28/2019 1800   PROTEINUR NEGATIVE 10/28/2019 1800   NITRITE NEGATIVE 10/28/2019 1800   LEUKOCYTESUR NEGATIVE 10/28/2019 1800   Recent Results (from the past 240 hour(s))  Respiratory Panel by RT PCR (Flu A&B, Covid) - Nasopharyngeal Swab     Status: None   Collection Time: 10/28/19  7:17 PM   Specimen: Nasopharyngeal Swab  Result Value Ref Range Status   SARS Coronavirus 2 by RT PCR NEGATIVE NEGATIVE Final    Comment: (NOTE) SARS-CoV-2 target nucleic acids are NOT  DETECTED. The SARS-CoV-2 RNA is generally detectable in upper respiratoy specimens during the acute phase of infection. The lowest concentration of SARS-CoV-2 viral copies this assay can detect is 131 copies/mL. A negative result does not preclude SARS-Cov-2 infection and should not be used as the sole basis for treatment or other patient management decisions. A negative result may occur with  improper specimen collection/handling, submission of specimen other than nasopharyngeal swab, presence of viral mutation(s) within the areas targeted by this assay, and inadequate number of viral copies (<131 copies/mL). A negative result must be combined with clinical observations, patient history, and epidemiological information. The expected result is Negative. Fact Sheet for Patients:  PinkCheek.be Fact Sheet for Healthcare Providers:  GravelBags.it This test is not yet ap proved or cleared by the [place] FDA and  has been authorized for detection and/or diagnosis of SARS-CoV-2 by FDA under an Emergency Use Authorization (EUA). This EUA will remain  in effect (meaning this test can be used) for the duration of the COVID-19 declaration under Section 564(b)(1) of the Act, 21 U.S.C. section 360bbb-3(b)(1), unless the authorization is terminated or revoked sooner.    Influenza A by PCR NEGATIVE NEGATIVE Final   Influenza B by PCR NEGATIVE NEGATIVE Final    Comment: (NOTE) The Xpert Xpress SARS-CoV-2/FLU/RSV assay is intended as an aid in  the diagnosis of influenza from Nasopharyngeal swab specimens and  should not be used as a sole basis for treatment. Nasal washings and  aspirates are unacceptable for Xpert Xpress SARS-CoV-2/FLU/RSV  testing. Fact Sheet for Patients: PinkCheek.be Fact Sheet for Healthcare Providers: GravelBags.it This test is not yet approved or cleared  by the [place] FDA and  has been authorized for detection and/or diagnosis of SARS-CoV-2 by  FDA under an Emergency Use Authorization (EUA). This EUA will remain  in effect (meaning this test can be used) for the duration of the  Covid-19 declaration under Section 564(b)(1) of the Act, 21  U.S.C. section 360bbb-3(b)(1), unless the authorization is  terminated or revoked. Performed at [facility], [place] [address] [place], [place] [address]   Surgical pcr screen     Status: None   Collection Time: 10/30/19 10:35 PM   Specimen: Nasal Mucosa; Nasal Swab  Result Value Ref Range Status   MRSA, PCR NEGATIVE NEGATIVE Final   Staphylococcus aureus NEGATIVE NEGATIVE Final    Comment: (NOTE) The Xpert SA Assay (FDA approved for NASAL specimens in patients 3 years of age and older), is one component of a comprehensive surveillance program. It is not intended to diagnose infection nor to guide or monitor treatment. Performed at [facility], [place] [address], [place], [place] [address]       Radiology Studies: No results found.  Scheduled Meds: . atorvastatin  80 mg Oral q1800  . Chlorhexidine Gluconate Cloth  6 each Topical Daily  . enoxaparin (LOVENOX) injection  40 mg Subcutaneous Q24H  . finasteride  5 mg Oral Daily  . lisinopril  10 mg Oral Daily  . loratadine  10 mg Oral Daily  . vitamin B-12  1,000 mcg Oral Daily   Continuous Infusions:    LOS: 6 days   Time spent: 25 minutes.  Patrecia Pour, MD [facility] [email or web address] 11/03/2019, 2:07 PM

## 2019-11-03 NOTE — Progress Notes (Signed)
Occupational Therapy Treatment Patient Details Name: Wesley [name] MRN: [id number] DOB: June 21, 1941 Today's Date: 11/03/2019    History of present illness 79 y.o. male with a history of CAD, HTN, HLD, OSA, BPH, GERD, and a couple months of abdominal pain, poor per oral intake, fatigue culminating in several days of constipation and abdominal distention. In the ED, a CT of the abdomen demonstrated distal partial obstruction with possible mass. Ht was admitted for colonoscopy 3/17. This showed obstructive mass with poor prep. Biopsy taken and ultimately showed only inflammation without dysplasia. General surgery performed sigmoid colectomy with end colostomy following urology placement of bilateral ureteral stents on 3/19.      Follow Up Recommendations  Home health OT;Supervision/Assistance - 24 hour    Equipment Recommendations  None recommended by OT    Recommendations for Other Services PT consult    Precautions / Restrictions Precautions Precautions: Fall Precaution Comments: colostomy LLQ       Mobility Bed Mobility Overal bed mobility: Needs Assistance Bed Mobility: Rolling;Sidelying to Sit;Sit to Sidelying Rolling: Min guard Sidelying to sit: Min assist     Sit to sidelying: Mod assist General bed mobility comments: encouraged pt  to obtain a bed rail for home use  Transfers Overall transfer level: Needs assistance Equipment used: Rolling walker (2 wheeled) Transfers: Sit to/from [facility] Sit to Stand: Min guard Stand pivot transfers: Min guard       General transfer comment: Min guard assist for safety cues for safe hand placement and to check his ostomy back for air and for fullness prior to ambulating.     Balance Overall balance assessment: Needs assistance Sitting-balance support: Feet supported;No upper extremity supported Sitting balance-Leahy Scale: Good     Standing balance support: Bilateral upper extremity supported;Single  extremity supported Standing balance-Leahy Scale: Fair                             ADL either performed or assessed with clinical judgement   ADL Overall ADL's : Needs assistance/impaired Eating/Feeding: Set up;Sitting Eating/Feeding Details (indicate cue type and reason): NG tube Grooming: Standing;Set up   Upper Body Bathing: Minimal assistance;Sitting;Set up   Lower Body Bathing: Sit to/from stand;Moderate assistance   Upper Body Dressing : Set up;Sitting   Lower Body Dressing: Sit to/from stand;Moderate assistance Lower Body Dressing Details (indicate cue type and reason): will benefit from a reacher Toilet Transfer: Minimal assistance;Ambulation;RW;Comfort height toilet;Grab bars Toilet Transfer Details (indicate cue type and reason): steadying assist Toileting- Clothing Manipulation and Hygiene: Sit to/from stand;Minimal assistance       Functional mobility during ADLs: Min guard;Rolling walker       Vision Patient Visual Report: No change from baseline            Cognition Arousal/Alertness: Awake/alert Behavior During Therapy: WFL for tasks assessed/performed Overall Cognitive Status: Within Functional Limits for tasks assessed                                                     Pertinent Vitals/ Pain       Pain Assessment: 0-10 Pain Score: 4  Pain Location: abdomen Pain Descriptors / Indicators: Discomfort;Constant;Guarding         Frequency  Min 3X/week        Progress Toward Goals  OT Goals(current goals can now be found in the care plan section)  Progress towards OT goals: Progressing toward goals     Plan Discharge plan remains appropriate       AM-PAC OT "6 Clicks" Daily Activity     Outcome Measure   Help from another person eating meals?: None Help from another person taking care of personal grooming?: A Little Help from another person toileting, which includes using toliet, bedpan, or urinal?: A  Little Help from another person bathing (including washing, rinsing, drying)?: A Little Help from another person to put on and taking off regular upper body clothing?: A Little Help from another person to put on and taking off regular lower body clothing?: A Little 6 Click Score: 19    End of Session Equipment Utilized During Treatment: Rolling walker;Other (comment)(abdominal binder)  OT Visit Diagnosis: Unsteadiness on feet (R26.81);Pain;Muscle weakness (generalized) (M62.81)   Activity Tolerance Patient tolerated treatment well   Patient Left with call bell/phone within reach;with family/visitor present;in bed   Nurse Communication          Time: 1030-1057 OT Time Calculation (min): 27 min  Charges: OT General Charges $OT Visit: 1 Visit OT Treatments $Self Care/Home Management : 23-37 mins  Kari Baars, [place] [phone] Office- [place], Edwena Felty D 11/03/2019, 2:07 PM

## 2019-11-03 NOTE — Progress Notes (Addendum)
Physical Therapy Treatment Patient Details Name: Wesley [name] MRN: [id number] DOB: August 06, 1941 Today's Date: 11/03/2019    History of Present Illness 79 y.o. male with a history of CAD, HTN, HLD, OSA, BPH, GERD, and a couple months of abdominal pain, poor per oral intake, fatigue culminating in several days of constipation and abdominal distention. In the ED, a CT of the abdomen demonstrated distal partial obstruction with possible mass. Ht was admitted for colonoscopy 3/17. This showed obstructive mass with poor prep. Biopsy taken and ultimately showed only inflammation without dysplasia. General surgery performed sigmoid colectomy with end colostomy following urology placement of bilateral ureteral stents on 3/19.    PT Comments    Progressing with mobility. Pt continues to have the most difficulty with bed mobility. 8/10 abd pain on today.    Follow Up Recommendations  Home health PT     Equipment Recommendations  None recommended by PT    Recommendations for Other Services       Precautions / Restrictions Precautions Precautions: Fall Precaution Comments: colostomy LLQ; abd binder Restrictions Weight Bearing Restrictions: No    Mobility  Bed Mobility Overal bed mobility: Needs Assistance Bed Mobility: Sidelying to Sit Rolling: Supervision Sidelying to sit: Min assist      General bed mobility comments: Increased time. Assist for trunk to upright.  Transfers Overall transfer level: Needs assistance Equipment used: Rolling walker (2 wheeled) Transfers: Sit to/from Stand Sit to Stand: Min guard;From elevated surface        General transfer comment: Close guard for safety.  Ambulation/Gait Ambulation/Gait assistance: Min guard Gait Distance (Feet): 200 Feet Assistive device: Rolling walker (2 wheeled) Gait Pattern/deviations: Step-through pattern;Decreased stride length     General Gait Details: Close guard for safety.   Stairs              Wheelchair Mobility    Modified Rankin (Stroke Patients Only)       Balance Overall balance assessment: Needs assistance Sitting-balance support: Feet supported;No upper extremity supported Sitting balance-Leahy Scale: Good     Standing balance support: Bilateral upper extremity supported Standing balance-Leahy Scale: Fair                              Cognition Arousal/Alertness: Awake/alert Behavior During Therapy: WFL for tasks assessed/performed Overall Cognitive Status: Within Functional Limits for tasks assessed                                        Exercises      General Comments        Pertinent Vitals/Pain Pain Assessment: 0-10 Pain Score: 8  Pain Location: abdomen Pain Descriptors / Indicators: Sore;Discomfort;Tender Pain Intervention(s): Patient requesting pain meds-RN notified;Monitored during session    Home Living                      Prior Function            PT Goals (current goals can now be found in the care plan section) Progress towards PT goals: Progressing toward goals    Frequency    Min 3X/week      PT Plan Current plan remains appropriate    Co-evaluation              AM-PAC PT "6 Clicks" Mobility   Outcome Measure  Help needed  turning from your back to your side while in a flat bed without using bedrails?: A Little Help needed moving from lying on your back to sitting on the side of a flat bed without using bedrails?: A Little Help needed moving to and from a bed to a chair (including a wheelchair)?: A Little Help needed standing up from a chair using your arms (e.g., wheelchair or bedside chair)?: A Little Help needed to walk in hospital room?: A Little Help needed climbing 3-5 steps with a railing? : A Little 6 Click Score: 18    End of Session   Activity Tolerance: Patient tolerated treatment well Patient left: in chair;with call bell/phone within reach   PT Visit  Diagnosis: Difficulty in walking, not elsewhere classified (R26.2);Pain Pain - part of body: (abdomen)     Time: [id number] PT Time Calculation (min) (ACUTE ONLY): 17 min  Charges:  $Gait Training: 8-22 mins                         Doreatha Massed, PT Acute Rehabilitation

## 2019-11-03 NOTE — Consult Note (Signed)
[facility] Nurse ostomy consult note Stoma type/location:  LLQ colostomy Stomal assessment/size: oval stoma, os at center, slightly above skin level. 2 inches from 3-9 o'clock, 1 and 1/4 inch from 12-6 o'clock. Peristomal assessment: peristomal erythema Treatment options for stomal/peristomal skin: skin barrier ring, appropriate pattern for oval stoma Output: brown stool Ostomy pouching: 2-pc. 2 and 3/4 inch pouching system with skin barrier ring Education provided:  Extended session with patient and wife.  Patient is not able to see ostomy, wife is attentive and does participate in session although she is very emotional and repeats that she does not think she can do this.  Explained role of ostomy nurse and creation of stoma  Explained stoma characteristics (budded, flush, color, texture, care) Demonstrated pouch change (cutting new skin barrier, measuring stoma, cleaning peristomal skin and stoma, use of barrier ring) Education on emptying when 1/3 to 1/2 full and how to empty and clean the bottom of the pouch with toilet paper wick. Answered patient/family questions regarding change frequency and emptying frequency.  This will require additional reviewing.  Enrolled patient in [place] program: Yes, today  Recommend HHRN.  If you agree, please order/arrange.  Plan to see patient and wife again tomorrow.  [place] nursing team will follow, and will remain available to this patient, the nursing and medical teams.   Thanks, Maudie Flakes, MSN, RN, [place], Arther Abbott  Pager# [phone]

## 2019-11-03 NOTE — Progress Notes (Signed)
Occupational Therapy Treatment Patient Details Name: Wesley [name] MRN: [id number] DOB: 1941/08/10 Today's Date: 11/03/2019    History of present illness 79 y.o. male with a history of CAD, HTN, HLD, OSA, BPH, GERD, and a couple months of abdominal pain, poor per oral intake, fatigue culminating in several days of constipation and abdominal distention. In the ED, a CT of the abdomen demonstrated distal partial obstruction with possible mass. Ht was admitted for colonoscopy 3/17. This showed obstructive mass with poor prep. Biopsy taken and ultimately showed only inflammation without dysplasia. General surgery performed sigmoid colectomy with end colostomy following urology placement of bilateral ureteral stents on 3/19.   OT comments  2nd OT session this day.  Pt plans to go home with wife.      Follow Up Recommendations  Home health OT;Supervision/Assistance - 24 hour    Equipment Recommendations  None recommended by OT    Recommendations for Other Services PT consult    Precautions / Restrictions Precautions Precautions: Fall Precaution Comments: colostomy LLQ Restrictions Weight Bearing Restrictions: No       Mobility Bed Mobility Overal bed mobility: Needs Assistance Bed Mobility: Rolling;Sit to Sidelying Rolling: Supervision Sidelying to sit: Min assist     Sit to sidelying: Min assist General bed mobility comments: Increased time. Assist for trunk to upright.  Transfers Overall transfer level: Needs assistance Equipment used: Rolling walker (2 wheeled) Transfers: Sit to/from [facility] Sit to Stand: Min guard;From elevated surface Stand pivot transfers: Min guard       General transfer comment: Close guard for safety.    Balance Overall balance assessment: Needs assistance Sitting-balance support: Feet supported;No upper extremity supported Sitting balance-Leahy Scale: Good     Standing balance support: Bilateral upper extremity  supported Standing balance-Leahy Scale: Fair                             ADL either performed or assessed with clinical judgement   ADL Overall ADL's : Needs assistance/impaired Eating/Feeding: Set up;Sitting Eating/Feeding Details (indicate cue type and reason): NG tube Grooming: Standing;Set up   Upper Body Bathing: Minimal assistance;Sitting;Set up   Lower Body Bathing: Sit to/from stand;Moderate assistance   Upper Body Dressing : Set up;Sitting   Lower Body Dressing: Sit to/from stand;Minimal assistance;Cueing for safety;With adaptive equipment;Cueing for compensatory techniques Lower Body Dressing Details (indicate cue type and reason): with a reacher.  Pt did well with reachers Toilet Transfer: Ambulation;RW;Comfort height toilet;Grab bars;Min guard Toilet Transfer Details (indicate cue type and reason): steadying assist Toileting- Clothing Manipulation and Hygiene: Min guard;Sit to/from stand;Cueing for safety;Cueing for compensatory techniques       Functional mobility during ADLs: Minimal assistance;Rolling walker;Cueing for safety;Cueing for sequencing       Vision Patient Visual Report: No change from baseline            Cognition Arousal/Alertness: Awake/alert Behavior During Therapy: WFL for tasks assessed/performed Overall Cognitive Status: Within Functional Limits for tasks assessed                                                     Pertinent Vitals/ Pain       Pain Assessment: 0-10 Pain Score: 5  Pain Location: abdomen Pain Descriptors / Indicators: Sore;Discomfort;Tender Pain Intervention(s): Limited activity within patient's tolerance  Frequency  Min 3X/week        Progress Toward Goals  OT Goals(current goals can now be found in the care plan section)  Progress towards OT goals: Progressing toward goals     Plan Discharge plan remains appropriate       AM-PAC OT "6 Clicks" Daily Activity      Outcome Measure   Help from another person eating meals?: None Help from another person taking care of personal grooming?: A Little Help from another person toileting, which includes using toliet, bedpan, or urinal?: A Little Help from another person bathing (including washing, rinsing, drying)?: A Little Help from another person to put on and taking off regular upper body clothing?: A Little Help from another person to put on and taking off regular lower body clothing?: A Little 6 Click Score: 19    End of Session Equipment Utilized During Treatment: Rolling walker;Other (comment)(abdominal binder)  OT Visit Diagnosis: Unsteadiness on feet (R26.81);Pain;Muscle weakness (generalized) (M62.81)   Activity Tolerance Patient tolerated treatment well   Patient Left with call bell/phone within reach;with family/visitor present;in bed   Nurse Communication Mobility status        Time: [id number] OT Time Calculation (min): 20 min  Charges: OT General Charges $OT Visit: 1 Visit OT Treatments $Self Care/Home Management : 8-22 mins  Kari Baars, [place] [phone] Office- [phone]      [place], Edwena Felty D 11/03/2019, 5:17 PM

## 2019-11-03 NOTE — Progress Notes (Signed)
3 Days Post-Op    CC: Abdominal pain, nausea and vomiting x3 days  Subjective: Patient really looks pretty good this morning.  He has some stool and gas in his ostomy bag.  The midline was covered and they are going to do some ostomy teaching later this AM.  I will come back and look at the midline wound with all the dressings off later.   He tolerated clear liquids, he has been up and ambulating some.   He is moving about 1250 on incentive spirometry.  Objective: Vital signs in last 24 hours: Temp:  [98.1 F (36.7 C)-98.3 F (36.8 C)] 98.2 F (36.8 C) (03/22 0548) Pulse Rate:  [87-106] 91 (03/22 0548) Resp:  [15-18] 16 (03/22 0548) BP: (113-134)/(74-83) 134/83 (03/22 0548) SpO2:  [95 %-97 %] 96 % (03/22 0548) Last BM Date: 10/28/19 840 p.o. 600 urine Nothing recorded from the colostomy Afebrile vital signs are stable NA 134, creatinine 0.77, WBC 12.4 H/H 14.2/43.2  Intake/Output from previous day: 03/21 0701 - 03/22 0700 In: 840 [P.O.:840] Out: 600 [Urine:600] Intake/Output this shift: No intake/output data recorded.  General appearance: alert, cooperative and no distress Resp: clear to auscultation bilaterally GI: Soft, sore, midline dressings look fine.  We will take those down and look at it later with the ostomy change.  Ostomy is putting out liquid stool and gas. Extremities: extremities normal, atraumatic, no cyanosis or edema  Lab Results:  Recent Labs    11/02/19 0541 11/03/19 0522  WBC 15.6* 12.4*  HGB 14.7 14.2  HCT 44.1 43.2  PLT 242 232    BMET Recent Labs    11/02/19 0541 11/03/19 0522  NA 132* 134*  K 3.6 4.5  CL 97* 99  CO2 28 29  GLUCOSE 117* 94  BUN 14 11  CREATININE 0.68 0.77  CALCIUM 7.9* 8.5*   PT/INR No results for input(s): LABPROT, INR in the last 72 hours.  Recent Labs  Lab 10/28/19 1552 10/29/19 0507 11/02/19 0541  AST 18 19 23   ALT 15 15 21   ALKPHOS 72 80 52  BILITOT 0.9 0.6 1.2  PROT 6.2* 7.4 5.3*  ALBUMIN 3.4*  3.9 2.6*     Lipase     Component Value Date/Time   LIPASE 20 10/28/2019 1552     Medications: . atorvastatin  80 mg Oral q1800  . Chlorhexidine Gluconate Cloth  6 each Topical Daily  . finasteride  5 mg Oral Daily  . lisinopril  10 mg Oral Daily  . loratadine  10 mg Oral Daily  . vitamin B-12  1,000 mcg Oral Daily   . 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 Erroll Luna, POD #3  - CEA 4.9  - Pathology pending  Cystoscopy with placement of bilateral ureteral catheters 10/31/2019 Dr. Franchot Gallo  FEN: IV fluids/clear liquids ID: Cefotetan preop DVT: SCDs, and Lovenox Follow-up: Dr. Brantley Stage  Plan: We will add Lovenox for DVT prophylaxis, increase him to a full liquid diet, ostomy teaching today.   LOS: 6 days    Durenda Pechacek 11/03/2019 Please see Amion

## 2019-11-03 NOTE — Care Management Important Message (Signed)
Important Message  Patient Details IM Letter given to Marney Doctor RN Case Manager to present to the Patient Name: PLES FRAGER MRN: [id number] Date of Birth: 1941/05/18   Medicare Important Message Given:  Yes     Kerin Salen 11/03/2019, 11:09 AM

## 2019-11-04 MED ORDER — METHOCARBAMOL 500 MG PO TABS
750.0000 mg | ORAL_TABLET | Freq: Three times a day (TID) | ORAL | Status: DC
  Administered 2019-11-04 – 2019-11-05 (×5): 750 mg via ORAL
  Filled 2019-11-04 (×5): qty 2

## 2019-11-04 MED ORDER — OXYCODONE HCL 5 MG PO TABS
5.0000 mg | ORAL_TABLET | ORAL | Status: DC | PRN
  Administered 2019-11-04: 10 mg via ORAL
  Administered 2019-11-04: 5 mg via ORAL
  Administered 2019-11-05 (×2): 10 mg via ORAL
  Filled 2019-11-04: qty 2
  Filled 2019-11-04: qty 1
  Filled 2019-11-04 (×2): qty 2

## 2019-11-04 MED ORDER — ACETAMINOPHEN 500 MG PO TABS
1000.0000 mg | ORAL_TABLET | Freq: Three times a day (TID) | ORAL | Status: DC
  Administered 2019-11-04 – 2019-11-05 (×4): 1000 mg via ORAL
  Filled 2019-11-04 (×4): qty 2

## 2019-11-04 NOTE — Progress Notes (Signed)
4 Days Post-Op    CC: Abdominal pain, nausea and vomiting x3 days.  Subjective: He is doing fairly well.  Tolerating soft diet.  Ostomy is working well.  He feels he needs more assistance with the ostomy care.  He is also extremely sore just getting up initially is really hard for him.  Midline incision is pictured below its little red but not ready for staple removal.  Objective: Vital signs in last 24 hours: Temp:  [97.8 F (36.6 C)-99.3 F (37.4 C)] 97.8 F (36.6 C) (03/23 0551) Pulse Rate:  [85-94] 85 (03/23 0551) Resp:  [14-18] 14 (03/23 0551) BP: (111-127)/(78-99) 127/99 (03/23 0551) SpO2:  [96 %-100 %] 100 % (03/23 0551) Last BM Date: 11/03/19 40 p.o. recorded 600 urine No ostomy output recorded. Afebrile vital signs are stable No labs today   Intake/Output from previous day: 03/22 0701 - 03/23 0700 In: 480 [P.O.:480] Out: 600 [Urine:600] Intake/Output this shift: Total I/O In: -  Out: 200 [Urine:200]  General appearance: alert, cooperative and no distress Resp: clear to auscultation bilaterally GI: Soft, sore, midline incision is a bit red but not ready for any staple removal.  Ostomy is working well.  He is tolerating a soft diet.    Lab Results:  Recent Labs    11/02/19 0541 11/03/19 0522  WBC 15.6* 12.4*  HGB 14.7 14.2  HCT 44.1 43.2  PLT 242 232    BMET Recent Labs    11/02/19 0541 11/03/19 0522  NA 132* 134*  K 3.6 4.5  CL 97* 99  CO2 28 29  GLUCOSE 117* 94  BUN 14 11  CREATININE 0.68 0.77  CALCIUM 7.9* 8.5*   PT/INR No results for input(s): LABPROT, INR in the last 72 hours.  Recent Labs  Lab 10/28/19 1552 10/29/19 0507 11/02/19 0541  AST 18 19 23   ALT 15 15 21   ALKPHOS 72 80 52  BILITOT 0.9 0.6 1.2  PROT 6.2* 7.4 5.3*  ALBUMIN 3.4* 3.9 2.6*     Lipase     Component Value Date/Time   LIPASE 20 10/28/2019 1552     Medications: . atorvastatin  80 mg Oral q1800  . Chlorhexidine Gluconate Cloth  6 each Topical Daily    . enoxaparin (LOVENOX) injection  40 mg Subcutaneous Q24H  . finasteride  5 mg Oral Daily  . lisinopril  10 mg Oral Daily  . loratadine  10 mg Oral Daily  . vitamin B-12  1,000 mcg Oral Daily  SURGICAL PATHOLOGY  CASE: [id number]  PATIENT: Wesley [name]  Surgical Pathology Report  Clinical History: Colonic obstruction (cm)  FINAL MICROSCOPIC DIAGNOSIS:  A. SIGMOID COLON, RESECTION:  - Inflammatory polyp. Organizing serosal adhesions. One morphologically benign lymph node.   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 Erroll Luna, POD #4  - CEA 4.9  - Pathology: Inflammatory Polyp  Cystoscopy with placement of bilateral ureteral catheters 10/31/2019 Dr. Franchot Gallo  FEN: IV fluids/Soft diet ID: Cefotetan preop DVT: SCDs, and Lovenox Follow-up: Dr. Brantley Stage Pain:  Oxycodone 5 x ; Morphine 4 mg x 2   Plan: I give him another day working with his ostomy and mobilizing.  We will place him on some scheduled Tylenol and Robaxin.  He is doing well we will aim for discharge tomorrow.   LOS: 7 days  Dyquan Minks 11/04/2019 Please see Amion

## 2019-11-04 NOTE — Plan of Care (Signed)

## 2019-11-04 NOTE — TOC Initial Note (Signed)
Transition of Care [facility]) - Initial/Assessment Note    Patient Details  Name: Wesley [name] MRN: [id number] Date of Birth: 1940-11-24  [facility] CM/SW Contact:    Lynnell Catalan, RN Phone Number: 11/04/2019, 10:49 AM  Clinical Narrative:                  This CM spoke with pt and wife at bedside about DC planning. Pt with new ostomy and PT is recommending HHPT. Choice offered for home health services and [facility] chosen. [facility] liaison contacted for referral.  Expected Discharge Plan: [place] Services Barriers to Discharge: Continued Medical Work up   Patient Goals and CMS Choice   CMS Medicare.gov Compare Post Acute Care list provided to:: Patient Choice offered to / list presented to : Patient  Expected Discharge Plan and Services Expected Discharge Plan: [place]   Discharge Planning Services: CM Consult Post Acute Care Choice: [place] arrangements for the past 2 months: Single Family Home                           HH Arranged: PT, RN [place] Agency: [place] Date [facility] Agency Contacted: 11/04/19 Time HH Agency Contacted: 84 Representative spoke with at [place]: Tommi Rumps  Prior Living Arrangements/Services Living arrangements for the past 2 months: [place] Lives with:: Spouse Patient language and need for interpreter reviewed:: Yes Do you feel safe going back to the place where you live?: Yes      Need for Family Participation in Patient Care: Yes (Comment) Care giver support system in place?: Yes (comment)      Activities of Daily Living Home Assistive Devices/Equipment: None ADL Screening (condition at time of admission) Patient's cognitive ability adequate to safely complete daily activities?: Yes Is the patient deaf or have difficulty hearing?: No Does the patient have difficulty seeing, even when wearing glasses/contacts?: No Does the patient have difficulty concentrating,  remembering, or making decisions?: No Patient able to express need for assistance with ADLs?: No Does the patient have difficulty dressing or bathing?: No Independently performs ADLs?: Yes (appropriate for developmental age) Does the patient have difficulty walking or climbing stairs?: Yes Weakness of Legs: None Weakness of Arms/Hands: None  Permission Sought/Granted         Permission granted to share info w AGENCY: [id number]        Emotional Assessment       Orientation: : Oriented to Self, Oriented to Situation, Oriented to Place, Oriented to  Time      Admission diagnosis:  Colonic mass [K63.89] Non-intractable vomiting with nausea, unspecified vomiting type [R11.2] Partial obstruction of colon ([place]) [K56.600] Patient Active Problem List   Diagnosis Date Noted  . Partial obstruction of colon ([place])   . Generalized abdominal pain   . Abdominal bloating   . 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 Colonic mass 10/28/2019  . Hypokalemia 10/28/2019  . 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 Non-traumatic rotator cuff tear 09/21/2017  . Syncope and collapse 08/12/2013  . Back pain 08/12/2013  . CAD (coronary artery disease) 08/12/2013  . High blood pressure 03/15/2011  . GERD (gastroesophageal reflux disease) 03/15/2011  . Paronychia of great toe of left foot 03/15/2011   PCP:  Deland Pretty, MD Pharmacy:   [place], [address]  [place] [address] Phone: [phone] Fax: [facility] [place], [place] [address] Phone: [phone] Fax: [phone]     Social Determinants of Health (SDOH) Interventions    Readmission Risk Interventions No flowsheet data found.

## 2019-11-04 NOTE — Progress Notes (Addendum)
PROGRESS NOTE  Wesley [name]  [id number] DOB: 11-Mar-1941 DOA: 10/28/2019 PCP: Deland Pretty, MD   Brief Narrative: Wesley [name] is a 79 y.o. male with a history of CAD, HTN, HLD, OSA, BPH, GERD, and a couple months of abdominal pain, poor per oral intake, fatigue culminating in several days of constipation and abdominal distention. In the ED, a CT of the abdomen demonstrated distal partial obstruction with possible mass. Ht was admitted for colonoscopy 3/17. This showed obstructive mass with poor prep. Biopsy taken and ultimately showed only inflammation without dysplasia. Abdomen remains distended with clinical evidence of obstruction. General surgery performed sigmoid colectomy with end colostomy on 3/19. Pathology from colonoscopy biopsy and surgical resection had returned without malignancy identified.  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 Initial biopsy results from 3/17 showing inflammation without dysplasia, surgical pathology with inflammation and serosal adhesion without neoplastic changes. Now s/p sigmoid colectomy with end colostomy, Hartman's pouch on 3/19 by Dr. Brantley Stage. - Post operative management of diet, ostomy, pain per general surgery. Agree with tylenol. - WOC training performed, supplies ordered. - Note reactive leukocytosis improving, afebrile, though erythema around staples in midline incision will need to be monitored in AM.  - Incentive spirometry strongly encouraged, and OOB. - Follow up with GI who recommends colonoscopy prior to reanastomosis.  CAD, HLD: No chest pain.  - Continue statin, restart ASA at discharge.  HTN:  - Continue  lisinopril  BPH:  - Continue finasteride  GERD:  - Continue PPI  DVT prophylaxis: Heparin 5,000 units q8h Code Status: Full Family Communication: None at bedside this AM. Disposition Plan: Per general surgery, likely DC 3/24, will monitor midline incision in AM and continue ostomy training.  Consultants:   GI  Surgery  Urology  Procedures:   Flexible sigmoidoscopy 3/17 by Dr. Hilarie Fredrickson:  Findings:      The digital rectal exam was normal.      Extensive amounts of semi-liquid semi-solid stool was found in the       rectum and in the sigmoid colon, making visualization difficult. Copious       irrigation and lavage performed.      Many small-mouthed diverticula were found in the sigmoid colon.      There is a stricture at approximately 30 cm from the dentate line. This       is in a segment of severe diverticulosis and there is a polypoid       partially obstructing mass at this location. Given the stricture and       poor preparation due to partial obstruction, the mass is difficult to       fully characterized, but appears to be partially circumferential. The       mass and stricture is rather short segment, approximately 3-4 cm in       length. No bleeding was present. Biopsies were taken on the mass and at       the stricture with a cold forceps for histology. Area well distal to       this mass was tattooed with 2 injections on opposite walls totally 4 mL  of Spot (carbon black). Of note colon above the stricture is dilated but       appears very viable though views limited by stool which could not be       cleared. Impression:       - Stool in the rectum and in the sigmoid colon.                            Visualization limited.                           - Diverticulosis in the sigmoid colon.                           - Rule out malignancy, partially obstructing tumor                            causing narrowing as above in the sigmoid colon.                             Biopsied. Tattooed.  10/31/19: DIAGNOSTIC LAPAROSCOPY; EXPLORATORY LAPAROTOMY; SIGMOID COLECTOMY by Dr. Brantley Stage  Antimicrobials:  None   Subjective: Eating ok, pain is severe, intermittent, improved dramatically with opioids. Hasn't tried tylenol.   Objective: Vitals:   11/03/19 1329 11/03/19 1921 11/03/19 2137 11/04/19 0551  BP: 111/78  113/80 (!) 127/99  Pulse: 94 87 87 85  Resp: 18 16 16 14   Temp: 99.3 F (37.4 C)  98.1 F (36.7 C) 97.8 F (36.6 C)  TempSrc: Oral  Oral Oral  SpO2: 98% 97% 96% 100%  Weight:      Height:        Intake/Output Summary (Last 24 hours) at 11/04/2019 1103 Last data filed at 11/04/2019 0800 Gross per 24 hour  Intake 480 ml  Output 700 ml  Net -220 ml   Filed Weights   10/29/19 1104  Weight: 81.6 kg   Gen: 79 y.o. male in no distress, pleasant and interactive. Pulm: Nonlabored breathing room air. Clear. CV: Regular rate and rhythm. No murmur, rub, or gallop. No JVD, no dependent edema. GI: Abdomen soft, minimally tender, midline incision with erythema around staple sites, no dehiscence, +BS. Stool in bag  Ext: Warm, no deformities Skin: Incision as above, otherwise no rashes, lesions or ulcers on visualized skin. Neuro: Alert and oriented. No focal neurological deficits. Psych: Judgement and insight appear fair. Mood euthymic & affect congruent. Behavior is appropriate.     Data Reviewed: I have personally reviewed following labs and imaging studies  CBC: Recent Labs  Lab 10/28/19 1552 10/29/19 0507 11/01/19 0755 11/02/19 0541 11/03/19 0522  WBC 8.6 9.9 16.3* 15.6* 12.4*  NEUTROABS 6.8  --   --   --   --   HGB 14.4 16.8 16.7 14.7 14.2  HCT 42.9 50.8 50.2 44.1 43.2  MCV 98.8 97.7 98.0 98.4 99.3  PLT 200 275 248 242 [id number]   Basic Metabolic Panel: Recent Labs  Lab 10/28/19 1552 10/29/19 0507 11/02/19 0541 11/03/19 0522  NA 139 136 132* 134*  K 3.0* 4.6 3.6 4.5  CL 107 99 97* 99  CO2 21* 26 28 29   GLUCOSE 110* 143*  117* 94  BUN 14 13 14 11   CREATININE 0.57* 0.86 0.68 0.77  CALCIUM 7.7* 9.3 7.9* 8.5*  GFR: Estimated Creatinine Clearance: 73.6 mL/min (by C-G formula based on SCr of 0.77 mg/dL). Liver Function Tests: Recent Labs  Lab 10/28/19 1552 10/29/19 0507 11/02/19 0541  AST 18 19 23   ALT 15 15 21   ALKPHOS 72 80 52  BILITOT 0.9 0.6 1.2  PROT 6.2* 7.4 5.3*  ALBUMIN 3.4* 3.9 2.6*   Recent Labs  Lab 10/28/19 1552  LIPASE 20   No results for input(s): AMMONIA in the last 168 hours. Coagulation Profile: No results for input(s): INR, PROTIME in the last 168 hours. Cardiac Enzymes: No results for input(s): CKTOTAL, CKMB, CKMBINDEX, TROPONINI in the last 168 hours. BNP (last 3 results) No results for input(s): PROBNP in the last 8760 hours. HbA1C: No results for input(s): HGBA1C in the last 72 hours. CBG: No results for input(s): GLUCAP in the last 168 hours. Lipid Profile: No results for input(s): CHOL, HDL, LDLCALC, TRIG, CHOLHDL, LDLDIRECT in the last 72 hours. Thyroid Function Tests: No results for input(s): TSH, T4TOTAL, FREET4, T3FREE, THYROIDAB in the last 72 hours. Anemia Panel: No results for input(s): VITAMINB12, FOLATE, FERRITIN, TIBC, IRON, RETICCTPCT in the last 72 hours. Urine analysis:    Component Value Date/Time   COLORURINE YELLOW 10/28/2019 1800   APPEARANCEUR CLEAR 10/28/2019 1800   LABSPEC 1.034 (H) 10/28/2019 1800   PHURINE 5.0 10/28/2019 1800   GLUCOSEU NEGATIVE 10/28/2019 1800   HGBUR SMALL (A) 10/28/2019 1800   BILIRUBINUR NEGATIVE 10/28/2019 1800   KETONESUR 20 (A) 10/28/2019 1800   PROTEINUR NEGATIVE 10/28/2019 1800   NITRITE NEGATIVE 10/28/2019 1800   LEUKOCYTESUR NEGATIVE 10/28/2019 1800   Recent Results (from the past 240 hour(s))  Respiratory Panel by RT PCR (Flu A&B, Covid) - Nasopharyngeal Swab     Status: None   Collection Time: 10/28/19  7:17 PM   Specimen: Nasopharyngeal Swab  Result Value Ref Range Status   SARS Coronavirus 2 by RT  PCR NEGATIVE NEGATIVE Final    Comment: (NOTE) SARS-CoV-2 target nucleic acids are NOT DETECTED. The SARS-CoV-2 RNA is generally detectable in upper respiratoy specimens during the acute phase of infection. The lowest concentration of SARS-CoV-2 viral copies this assay can detect is 131 copies/mL. A negative result does not preclude SARS-Cov-2 infection and should not be used as the sole basis for treatment or other patient management decisions. A negative result may occur with  improper specimen collection/handling, submission of specimen other than nasopharyngeal swab, presence of viral mutation(s) within the areas targeted by this assay, and inadequate number of viral copies (<131 copies/mL). A negative result must be combined with clinical observations, patient history, and epidemiological information. The expected result is Negative. Fact Sheet for Patients:  PinkCheek.be Fact Sheet for Healthcare Providers:  GravelBags.it This test is not yet ap proved or cleared by the [place] FDA and  has been authorized for detection and/or diagnosis of SARS-CoV-2 by FDA under an Emergency Use Authorization (EUA). This EUA will remain  in effect (meaning this test can be used) for the duration of the COVID-19 declaration under Section 564(b)(1) of the Act, 21 U.S.C. section 360bbb-3(b)(1), unless the authorization is terminated or revoked sooner.    Influenza A by PCR NEGATIVE NEGATIVE Final   Influenza B by PCR NEGATIVE NEGATIVE Final    Comment: (NOTE) The Xpert Xpress SARS-CoV-2/FLU/RSV assay is intended as an aid in  the diagnosis of influenza from Nasopharyngeal swab specimens and  should not be used as a sole basis for treatment. Nasal washings and  aspirates are unacceptable for  Xpert Xpress SARS-CoV-2/FLU/RSV  testing. Fact Sheet for Patients: PinkCheek.be Fact Sheet for Healthcare  Providers: GravelBags.it This test is not yet approved or cleared by the [place] FDA and  has been authorized for detection and/or diagnosis of SARS-CoV-2 by  FDA under an Emergency Use Authorization (EUA). This EUA will remain  in effect (meaning this test can be used) for the duration of the  Covid-19 declaration under Section 564(b)(1) of the Act, 21  U.S.C. section 360bbb-3(b)(1), unless the authorization is  terminated or revoked. Performed at [facility], [place] [address] [place], [place] [address]   Surgical pcr screen     Status: None   Collection Time: 10/30/19 10:35 PM   Specimen: Nasal Mucosa; Nasal Swab  Result Value Ref Range Status   MRSA, PCR NEGATIVE NEGATIVE Final   Staphylococcus aureus NEGATIVE NEGATIVE Final    Comment: (NOTE) The Xpert SA Assay (FDA approved for NASAL specimens in patients 44 years of age and older), is one component of a comprehensive surveillance program. It is not intended to diagnose infection nor to guide or monitor treatment. Performed at [facility], [place] [address], [place], [place] [address]       Radiology Studies: No results found.  Scheduled Meds: . acetaminophen  1,000 mg Oral Q8H  . atorvastatin  80 mg Oral q1800  . Chlorhexidine Gluconate Cloth  6 each Topical Daily  . enoxaparin (LOVENOX) injection  40 mg Subcutaneous Q24H  . finasteride  5 mg Oral Daily  . lisinopril  10 mg Oral Daily  . loratadine  10 mg Oral Daily  . methocarbamol  750 mg Oral TID  . vitamin B-12  1,000 mcg Oral Daily   Continuous Infusions:    LOS: 7 days   Time spent: 25 minutes.  Patrecia Pour, MD [facility] [email or web address] 11/04/2019, 11:03 AM

## 2019-11-04 NOTE — Progress Notes (Signed)
PT Cancellation Note  Patient Details Name: Wesley [name] MRN: [id number] DOB: 1940-10-08   Cancelled Treatment:    Reason Eval/Treat Not Completed: Attempted PT tx session-pt declined-just finished walking. Will check back another day.    Doreatha Massed, PT Acute Rehabilitation

## 2019-11-04 NOTE — Discharge Instructions (Signed)
[facility] will be following up with you at home for a home health RN and home health physical therapy.  [phone].  Medicare ratings for [facility]: Quality 4 1/2 out of 5 stars Patient survey rating 5 out of 5 stars.  [facility], [place] [phone]  OPEN ABDOMINAL SURGERY: POST OP INSTRUCTIONS  Always review your discharge instruction sheet given to you by the facility where your surgery was performed.  IF YOU HAVE DISABILITY OR FAMILY LEAVE FORMS, YOU MUST BRING THEM TO THE OFFICE FOR PROCESSING.  PLEASE DO NOT GIVE THEM TO YOUR DOCTOR.  1. A prescription for pain medication may be given to you upon discharge.  Take your pain medication as prescribed, if needed.  If narcotic pain medicine is not needed, then you may take acetaminophen (Tylenol) or ibuprofen (Advil) as needed. 2. Take your usually prescribed medications unless otherwise directed. 3. If you need a refill on your pain medication, please contact your pharmacy. They will contact our office to request authorization.  Prescriptions will not be filled after 5pm or on week-ends. 4. You should follow a light diet the first few days after arrival home, such as soup and crackers, pudding, etc.unless your doctor has advised otherwise. A high-fiber, low fat diet can be resumed as tolerated.   Be sure to include lots of fluids daily. Most patients will experience some swelling and bruising on the chest and neck area.  Ice packs will help.  Swelling and bruising can take several days to resolve 5. Most patients will experience some swelling and bruising in the area of the incision. Ice pack will help. Swelling and bruising can take several days to resolve..  6. It is common to experience some constipation if taking pain medication after surgery.  Increasing fluid intake and taking a stool softener will usually help or prevent this problem from occurring.  A mild laxative (Milk of Magnesia or Miralax) should be  taken according to package directions if there are no bowel movements after 48 hours. 7.  You may have steri-strips (small skin tapes) in place directly over the incision.  These strips should be left on the skin for 7-10 days.  If your surgeon used skin glue on the incision, you may shower in 24 hours.  The glue will flake off over the next 2-3 weeks.  Any sutures or staples will be removed at the office during your follow-up visit. You may find that a light gauze bandage over your incision may keep your staples from being rubbed or pulled. You may shower and replace the bandage daily. 8. ACTIVITIES:  You may resume regular (light) daily activities beginning the next day--such as daily self-care, walking, climbing stairs--gradually increasing activities as tolerated.  You may have sexual intercourse when it is comfortable.  Refrain from any heavy lifting or straining until approved by your doctor. a. You may drive when you no longer are taking prescription pain medication, you can comfortably wear a seatbelt, and you can safely maneuver your car and apply brakes b. Return to Work: ___________________________________ 19. You should see your doctor in the office for a follow-up appointment approximately two weeks after your surgery.  Make sure that you call for this appointment within a day or two after you arrive home to insure a convenient appointment time. OTHER INSTRUCTIONS:  _____________________________________________________________ _____________________________________________________________  WHEN TO CALL YOUR DOCTOR: 1. Fever over 101.0 2. Inability to urinate 3. Nausea and/or vomiting 4. Extreme swelling  or bruising 5. Continued bleeding from incision. 6. Increased pain, redness, or drainage from the incision. 7. Difficulty swallowing or breathing 8. Muscle cramping or spasms. 9. Numbness or tingling in hands or feet or around lips.  The clinic staff is available to answer your  questions during regular business hours.  Please don't hesitate to call and ask to speak to one of the nurses if you have concerns.  For further questions, please visit [email or web address]    Colostomy Home Guide, Adult  Colostomy surgery is done to create an opening in the front of the abdomen for stool (feces) to leave the body through an ostomy (stoma). Part of the large intestine is attached to the stoma. A bag, also called a pouch, is fitted over the stoma. Stool and gas will collect in the bag. After surgery, you will need to empty and change your colostomy bag as needed. You will also need to care for your stoma. How to care for the stoma Your stoma should look pink, red, and moist, like the inside of your cheek. Soon after surgery, the stoma may be swollen, but this swelling will go away within 6 weeks. To care for the stoma:  Keep the skin around the stoma clean and dry.  Use a clean, soft washcloth to gently wash the stoma and the skin around it. Clean using a circular motion, and wipe away from the stoma opening, not toward it. ? Use warm water and only use cleansers recommended by your health care provider. ? Rinse the stoma area with plain water. ? Dry the area around the stoma well.  Use stoma powder or ointment on your skin only as told by your health care provider. Do not use any other powders, gels, wipes, or creams on the skin around the stoma.  Check the stoma area every day for signs of infection. Check for: ? New or worsening redness, swelling, or pain. ? New or increased fluid or blood. ? Pus or warmth.  Measure the stoma opening regularly and record the size. Watch for changes. (It is normal for the stoma to get smaller as swelling goes away.) Share this information with your health care provider. How to empty the colostomy bag  Empty your bag at bedtime and whenever it is one-third to one-half full. Do not let the bag get more than half-full with stool  or gas. The bag could leak if it gets too full. Some colostomy bags have a built-in gas release valve that releases gas often throughout the day. Follow these basic steps: 1. Wash your hands with soap and water. 2. Sit far back on the toilet seat. 3. Put several pieces of toilet paper into the toilet water. This will prevent splashing as you empty stool into the toilet. 4. Remove the clip or the hook-and-loop fastener from the tail end of the bag. 5. Unroll the tail, then empty the stool into the toilet. 6. Clean the tail with toilet paper or a moist towelette. 7. Reroll the tail, and close it with the clip or the hook-and-loop fastener. 8. Wash your hands again. How to change the colostomy bag Change your bag every 3-4 days or as often as told by your health care provider. Also change the bag if it is leaking or separating from the skin, or if your skin around the stoma looks or feels irritated. Irritated skin may be a sign that the bag is leaking. Always have colostomy supplies with you, and follow these basic steps:  1. Wash your hands with soap and water. Have paper towels or tissues nearby to clean any discharge. 2. Remove the old bag and skin barrier. Use your fingers or a warm cloth to gently push the skin away from the barrier. 3. Clean the stoma area with water or with mild soap and water, as directed. Use water to rinse away any soap. 4. Dry the skin. You may use the cool setting on a hair dryer to do this. 5. Use a tracing pattern (template) to cut the skin barrier to the size needed. 6. If you are using a two-piece bag, attach the bag and the skin barrier to each other. Add the barrier ring, if you use one. 7. If directed, apply stoma powder or skin barrier gel to the skin. 8. Warm the skin barrier with your hands, or blow with a hair dryer for 5-10 seconds. 9. Remove the paper from the adhesive strip of the skin barrier. 10. Press the adhesive strip onto the skin around the  stoma. 11. Gently rub the skin barrier onto the skin. This creates heat that helps the barrier to stick. 12. Apply stoma tape to the edges of the skin barrier, if desired. 27. Wash your hands again. General recommendations  Avoid wearing tight clothes or having anything press directly on your stoma or bag. Change your clothing whenever it is soiled or damp.  You may shower or bathe with the bag on or off. Do not use harsh or oily soaps or lotions. Dry the skin and bag after bathing.  Store all supplies in a cool, dry place. Do not leave supplies in extreme heat because some parts can melt or not stick as well.  Whenever you leave home, take extra clothing and an extra skin barrier and bag with you.  If your bag gets wet, you can dry it with a hair dryer on the cool setting.  To prevent odor, you may put drops of ostomy deodorizer in the bag.  If recommended by your health care provider, put ostomy lubricant inside the bag. This helps stool to slide out of the bag more easily and completely. Contact a health care provider if:  You have new or worsening redness, swelling, or pain around your stoma.  You have new or increased fluid or blood coming from your stoma.  Your stoma feels warm to the touch.  You have pus coming from your stoma.  Your stoma extends in or out farther than normal.  You need to change your bag every day.  You have a fever. Get help right away if:  Your stool is bloody.  You have nausea or you vomit.  You have trouble breathing. Summary  Measure your stoma opening regularly and record the size. Watch for changes.  Empty your bag at bedtime and whenever it is one-third to one-half full. Do not let the bag get more than half-full with stool or gas.  Change your bag every 3-4 days or as often as told by your health care provider.  Whenever you leave home, take extra clothing and an extra skin barrier and bag with you. This information is not intended  to replace advice given to you by your health care provider. Make sure you discuss any questions you have with your health care provider. Document Revised: 11/20/2018 Document Reviewed: 01/24/2017 Elsevier Patient Education  [place].

## 2019-11-05 MED ORDER — OXYCODONE HCL 5 MG PO TABS: 5.0000 mg | ORAL_TABLET | Freq: Four times a day (QID) | ORAL | 0 refills | Status: DC | PRN

## 2019-11-05 MED ORDER — ACETAMINOPHEN 500 MG PO TABS: 1000.0000 mg | ORAL_TABLET | Freq: Three times a day (TID) | ORAL | 0 refills | Status: DC | PRN

## 2019-11-05 MED ORDER — METHOCARBAMOL 750 MG PO TABS: 750.0000 mg | ORAL_TABLET | Freq: Three times a day (TID) | ORAL | 0 refills | Status: DC | PRN

## 2019-11-05 MED ORDER — ONDANSETRON HCL 4 MG PO TABS: 4.0000 mg | ORAL_TABLET | Freq: Four times a day (QID) | ORAL | 0 refills | Status: DC | PRN

## 2019-11-05 MED ORDER — ALUM & MAG HYDROXIDE-SIMETH 200-200-20 MG/5ML PO SUSP: 30.0000 mL | ORAL | 0 refills | Status: DC | PRN

## 2019-11-05 NOTE — Progress Notes (Addendum)
5 Days Post-Op    CCS Progress Note  Cc. Abdominal discomfort Subjective: Pain controlled on scheduled APAP, robaxin, and PRN oxy . Tolerating soft diet but dislikes hospital food. Mobilizing with rolling walker. Stool in ostomy pouch. States the [place] RN is supposed to come visit today to work with him and his wife.   Also states his son is driving into town from [place] today and will arrive in [place] around [place]. Patient states he would feel more comfortable going home tomorrow morning so his son could help get his bed lowered before his arrival home.  Objective: Vital signs in last 24 hours: Temp:  [97.4 F (36.3 C)-98 F (36.7 C)] 97.8 F (36.6 C) (03/24 0524) Pulse Rate:  [74-85] 74 (03/24 0524) Resp:  [16-18] 16 (03/24 0524) BP: (114-126)/(84-96) 126/96 (03/24 0524) SpO2:  [96 %-98 %] 96 % (03/24 0524) Last BM Date: 11/04/19 840 p.o. 600 urine Nothing recorded from the colostomy Afebrile vital signs are stable NA 134, creatinine 0.77, WBC 12.4 H/H 14.2/43.2  Intake/Output from previous day: 03/23 0701 - 03/24 0700 In: -  Out: 800 [Urine:800] Intake/Output this shift: No intake/output data recorded.  General appearance: alert, cooperative and no distress Resp: clear to auscultation bilaterally GI: Soft, non-distended, appropriately tender, midline incision with stable erythema at staple insertion sites - no underlying fluctuance, induration, or active drainage. Stoma pink/viable with stool efflux in ostomy pouch. Extremities: extremities normal, atraumatic, no cyanosis or edema  Lab Results:  Recent Labs    11/03/19 0522  WBC 12.4*  HGB 14.2  HCT 43.2  PLT 232    BMET Recent Labs    11/03/19 0522  NA 134*  K 4.5  CL 99  CO2 29  GLUCOSE 94  BUN 11  CREATININE 0.77  CALCIUM 8.5*   PT/INR No results for input(s): LABPROT, INR in the last 72 hours.  Recent Labs  Lab 11/02/19 0541  AST 23  ALT 21  ALKPHOS 52  BILITOT 1.2  PROT 5.3*  ALBUMIN 2.6*      Lipase     Component Value Date/Time   LIPASE 20 10/28/2019 1552     Medications: . acetaminophen  1,000 mg Oral Q8H  . atorvastatin  80 mg Oral q1800  . Chlorhexidine Gluconate Cloth  6 each Topical Daily  . enoxaparin (LOVENOX) injection  40 mg Subcutaneous Q24H  . finasteride  5 mg Oral Daily  . lisinopril  10 mg Oral Daily  . loratadine  10 mg Oral Daily  . methocarbamol  750 mg Oral TID  . 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 Erroll Luna, POD #5  - CEA 4.9  - Pathology w/ inflammatory polyp, negative for dysplasia  - erythema at staple insertion sites - favor local skin irration, not wound infection. There is no induration or dehiscence. Patient is afebrile and WBC trending down.   Cystoscopy with placement of bilateral ureteral catheters 10/31/2019 Dr. Franchot Gallo  FEN: Regular diet ID: Cefotetan preop DVT: SCDs, and Lovenox Follow-up: Dr. Brantley Stage  Plan: stable for discharge home from surgical perspective after working with [facility] RN. Follow up has been arranged in 1 week for wound check and with Dr. Brantley Stage in 2 weeks.    LOS: 8 days  Wesley [name] 11/05/2019 Please see Amion

## 2019-11-05 NOTE — Discharge Summary (Signed)
Physician Discharge Summary  Wesley [name] [id number] DOB: 04-26-1941 DOA: 10/28/2019  PCP: Deland Pretty, MD  Admit date: 10/28/2019 Discharge date: 11/05/2019  Admitted From: Home Disposition: Home  Recommendations for Outpatient Follow-up:  1. Follow up with PCP in 1 week with repeat CBC/BMP 2. Outpatient follow-up with general surgery.  Wound care/colostomy care as per general surgery recommendations.  Pain management as per general surgery recommendations 3. Outpatient follow-up with GI 4. Follow up in ED if symptoms worsen or new appear   Home Health: PT/RN Equipment/Devices: None  Discharge Condition: Stable CODE STATUS: Full Diet recommendation: Heart healthy  Brief/Interim Summary: Wesley [name] is a 79 y.o. male with a history of CAD, HTN, HLD, OSA, BPH, GERD, and a couple months of abdominal pain, poor per oral intake, fatigue culminating in several days of constipation and abdominal distention. In the ED, a CT of the abdomen demonstrated distal partial obstruction with possible mass. He was admitted for colonoscopy 10/29/19. This showed obstructive mass with poor prep. Biopsy taken and ultimately showed only inflammation without dysplasia. Abdomen remains distended with clinical evidence of obstruction. General surgery performed sigmoid colectomy with end colostomy on 10/31/19. Pathology from colonoscopy biopsy and surgical resection had returned without malignancy identified.  Subsequently, he has remained stable and tolerated diet.  General surgery has cleared the patient for discharge with outpatient follow-up.  Wound care/colostomy care as per general surgery recommendations.  He will be discharged home with home health PT/RN today.  Discharge Diagnoses:   Colon mass, partial obstruction: CEA 4.9. Initial biopsy results from 3/17 showing inflammation without dysplasia, surgical pathology with inflammation and serosal adhesion without neoplastic changes. Now s/p  sigmoid colectomy with end colostomy, Hartman's pouch on 10/31/19 by Dr. Brantley Stage. - Post operative management of diet, ostomy, pain per general surgery.  -  Follow up with GI who recommends colonoscopy prior to reanastomosis. -Patient is tolerating diet.   - General surgery has cleared the patient for discharge with outpatient follow-up.  Wound care/colostomy care as per general surgery recommendations.  He will be discharged home with home health PT/RN today.   CAD, HLD: No chest pain.  - Continue statin.  Outpatient follow-up  HTN:  - Continue lisinopril  BPH:  - Continue finasteride   Discharge Instructions  Discharge Instructions    Ambulatory referral to Gastroenterology   Complete by: As directed    Diet - low sodium heart healthy   Complete by: As directed    Increase activity slowly   Complete by: As directed      Allergies as of 11/05/2019      Reactions   Demerol Other (See Comments)   Causes dizziness   Promethazine Hcl Other (See Comments)   Dizziness      Medication List    STOP taking these medications   aspirin EC 81 MG tablet   ibuprofen 200 MG tablet Commonly known as: ADVIL     TAKE these medications   acetaminophen 500 MG tablet Commonly known as: TYLENOL Take 2 tablets (1,000 mg total) by mouth every 8 (eight) hours as needed for mild pain or moderate pain.   alum & mag hydroxide-simeth 200-200-20 MG/5ML suspension Commonly known as: MAALOX/MYLANTA Take 30 mLs by mouth every 4 (four) hours as needed for indigestion.   atorvastatin 80 MG tablet Commonly known as: LIPITOR TAKE 1 TABLET EVERY DAY  AT  6  PM What changed: See the new instructions.   cetirizine 10 MG tablet Commonly known as: ZYRTEC  Take 10 mg by mouth daily. ALLERTEC  AT 2 AM DAILY   Chromium Picolinate 800 MCG Tabs Take 1 tablet by mouth daily.   finasteride 5 MG tablet Commonly known as: PROSCAR Take 5 mg by mouth every morning. AT 2 AM DAILY   Fish Oil 1200 MG  Caps Take 1,200 mg by mouth daily. IN AFTERNOON AFTER LUNCH   fluticasone 50 MCG/ACT nasal spray Commonly known as: FLONASE Place 2 sprays into both nostrils daily as needed for allergies or rhinitis.   lisinopril 10 MG tablet Commonly known as: ZESTRIL Take 10 mg by mouth every morning. AT 2 AM DAILY   Magnesium 400 MG Tabs Take 1 tablet by mouth daily as needed.   methocarbamol 750 MG tablet Commonly known as: ROBAXIN Take 1 tablet (750 mg total) by mouth every 8 (eight) hours as needed for muscle spasms (post-operative muscle pain).   ondansetron 4 MG tablet Commonly known as: ZOFRAN Take 1 tablet (4 mg total) by mouth every 6 (six) hours as needed for nausea.   oxyCODONE 5 MG immediate release tablet Commonly known as: Oxy IR/ROXICODONE Take 1 tablet (5 mg total) by mouth every 6 (six) hours as needed for moderate pain or severe pain (pain not releived by tylenol).   vitamin B-12 1000 MCG tablet Commonly known as: CYANOCOBALAMIN Take 1,000 mcg by mouth daily. AT 2 AM DAILY      Follow-up Information    [facility] [place] Follow up.   Specialty: General Surgery Why: You have 2 appointments for your staples; the first is 11/07/19 to check your staples at 10 AM.  The second is for staple removal 11/12/19 at 10AM.  Bring photo ID and insurance information with you. Be at the office 30 minutes early for check in.   Contact information: [place] [address] [place] [address] [phone]        Erroll Luna, MD Follow up on 11/17/2019.   Specialty: General Surgery Why: your appointment is at 10 AM.  Be at the office 30 minutes early for check in.   Contact information: [address] [place] [address] [phone]        Deland Pretty, MD. Schedule an appointment as soon as possible for a visit in 1 week(s).   Specialty: Internal Medicine Contact information: [address] [place] [address] [phone]         Jerline Pain, MD .   Specialty: Cardiology Contact information: [phone]  [place] [address] [phone]          Allergies  Allergen Reactions  . Demerol Other (See Comments)    Causes dizziness  . Promethazine Hcl Other (See Comments)    Dizziness     Consultations: General surgery/GI   Procedures/Studies: CT Abdomen Pelvis W Contrast  Result Date: 10/28/2019 CLINICAL DATA:  Suspect diverticulitis. Acute abdominal pain the past 3 days. EXAM: CT ABDOMEN AND PELVIS WITH CONTRAST TECHNIQUE: Multidetector CT imaging of the abdomen and pelvis was performed using the standard protocol following bolus administration of intravenous contrast. Automatic exposure control utilized. CONTRAST:  OMNIPAQUE IOHEXOL 350 MG/ML SOLN, 119mL OMNIPAQUE IOHEXOL 300 MG/ML SOLN COMPARISON:  August 12, 2013 FINDINGS: Lower chest: Normal heart size. Right main coronary calcification. Small paraesophageal hernia. Calcified atherosclerosis of the thoracic aorta. Mild subpleural atelectasis. No dependent pleural effusion. Hepatobiliary: Several small gallstones within the dependent gallbladder lumen. No gallbladder wall thickening or dilatation. No calcified stone within  the cystic or common bile duct. Mildly dilated 7 mm caliber of the proximal common bile duct with normal distal tapering. Fatty infiltration of the liver. Normal hepatic size and contours without ascites. Patent portal veins. Pancreas: Mild atrophy. No acute pancreatitis. Spleen: Normal. Adrenals/Urinary Tract: Normal right and left adrenal glands. Symmetrical moderate bilateral perinephric inflammatory change, likely medical renal disease. Normal bilateral renal cortical thickness. A 4.5 cm 8 Hounsfield unit simple appearing right renal lower pole exophytic cyst and smaller simple appearing bilateral cortical cysts. Nonobstructing right micro nephrolithiasis. No hydronephrosis or ureteral calculus bilaterally.  Stomach/Bowel: Normal appendix, coronal image 39. Moderate gaseous distension throughout the colon with air-fluid levels to the level of the mid sigmoid colon where stool or soft tissue mucosal thickening is present in the bowel lumen. Decompressed distal sigmoid colon. Small amount of steatotic stool in the rectum. Mild diverticulosis of the distal sigmoid and descending colon. Decompressed small bowel and gastric lumen without an apparent mucosal abnormality. 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 Aortic calcified atherosclerosis without an abdominal aortic aneurysmal dilatation. Reproductive: Mild prostatomegaly. Other: Small bilateral inguinal herniations of fat without strangulation or incarceration. Musculoskeletal: Diffuse mild bone demineralization. Moderate skeletal degenerative change. Grade 1 anterolisthesis of L4 on L5 and grade 1 retrolisthesis of L5 on S1 and T12 on L1, likely degenerative. Pseudoarticulation of the L4 and L5 spinous processes. Severe osseous narrowing of the left L5-S1 neural foramen. Probable benign bone islands in the bilateral femur. These results will be called to the ordering clinician or representative by the Radiologist Assistant, and communication documented in the PACS or [employer]. IMPRESSION: Partial distal colonic obstruction with moderate gaseous distension of the colon with air-fluid levels to the level of the mid sigmoid colon where there is either stool, colo-colonic intussusception, and/or abnormal and possibly malignant soft tissue mucosal mass. Correlation with colonoscopy recommended. No acute diverticulitis. Uncomplicated cholelithiasis. 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 Small paraesophageal hernia. Fatty infiltration of  the liver. Mild prostatomegaly. Severe osseous narrowing of the left L5-S1 neural foramen, potentially impinging on the exiting left L5 nerve root. Aortic Atherosclerosis (ICD10-I70.0). Electronically Signed   By: Revonda Humphrey   On: 10/28/2019 17:27     Flexible sigmoidoscopy 3/17 by Dr. Hilarie Fredrickson:  Findings: The digital rectal exam was normal. Extensive amounts of semi-liquid semi-solid stool was found in the  rectum and in the sigmoid colon, making visualization difficult. Copious  irrigation and lavage performed. Many small-mouthed diverticula were found in the sigmoid colon. There is a stricture at approximately 30 cm from the dentate line. This  is in a segment of severe diverticulosis and there is a polypoid  partially obstructing mass at this location. Given the stricture and  poor preparation due to partial obstruction, the mass is difficult to  fully characterized, but appears to be partially circumferential. The  mass and stricture is rather short segment, approximately 3-4 cm in  length. No bleeding was present. Biopsies were taken on the mass and at  the stricture with a cold forceps for histology. Area well distal to  this mass was tattooed with 2 injections on opposite walls totally 4 mL  of Spot (carbon black). Of note colon above the stricture is dilated but  appears very viable though views limited by stool which could not be  cleared. Impression:  - Stool in the  rectum and in the sigmoid colon.  Visualization limited. - Diverticulosis in the sigmoid colon. - Rule out malignancy, partially obstructing tumor  causing narrowing as above in the sigmoid colon.  Biopsied. Tattooed.  10/31/19: DIAGNOSTIC LAPAROSCOPY; EXPLORATORY LAPAROTOMY; SIGMOID COLECTOMY by  Dr. Brantley Stage   Subjective: Patient seen and examined at bedside.  He denies any worsening abdominal pain.  No overnight fever, nausea or vomiting.  He feels okay to go home today.  Discharge Exam: Vitals:   11/04/19 2201 11/05/19 0524  BP: 114/84 (!) 126/96  Pulse: 77 74  Resp: 18 16  Temp: 98 F (36.7 C) 97.8 F (36.6 C)  SpO2: 98% 96%    General: Pt is alert, awake, not in acute distress Cardiovascular: rate controlled, S1/S2 + Respiratory: bilateral decreased breath sounds at bases Abdominal: Soft, ND, bowel sounds +.  Minimally tender; midline incision with stable erythema at staple sites; colostomy bag present. Extremities: no edema, no cyanosis    The results of significant diagnostics from this hospitalization (including imaging, microbiology, ancillary and laboratory) are listed below for reference.     Microbiology: Recent Results (from the past 240 hour(s))  Respiratory Panel by RT PCR (Flu A&B, Covid) - Nasopharyngeal Swab     Status: None   Collection Time: 10/28/19  7:17 PM   Specimen: Nasopharyngeal Swab  Result Value Ref Range Status   SARS Coronavirus 2 by RT PCR NEGATIVE NEGATIVE Final    Comment: (NOTE) SARS-CoV-2 target nucleic acids are NOT DETECTED. The SARS-CoV-2 RNA is generally detectable in upper respiratoy specimens during the acute phase of infection. The lowest concentration of SARS-CoV-2 viral copies this assay can detect is 131 copies/mL. A negative result does not preclude SARS-Cov-2 infection and should not be used as the sole basis for treatment or other patient management decisions. A negative result may occur with  improper specimen collection/handling, submission of specimen other than nasopharyngeal swab, presence of viral mutation(s) within the areas targeted by this assay, and inadequate number of viral copies (<131 copies/mL). A negative result must be combined with clinical observations, patient history, and epidemiological  information. The expected result is Negative. Fact Sheet for Patients:  PinkCheek.be Fact Sheet for Healthcare Providers:  GravelBags.it This test is not yet ap proved or cleared by the [place] FDA and  has been authorized for detection and/or diagnosis of SARS-CoV-2 by FDA under an Emergency Use Authorization (EUA). This EUA will remain  in effect (meaning this test can be used) for the duration of the COVID-19 declaration under Section 564(b)(1) of the Act, 21 U.S.C. section 360bbb-3(b)(1), unless the authorization is terminated or revoked sooner.    Influenza A by PCR NEGATIVE NEGATIVE Final   Influenza B by PCR NEGATIVE NEGATIVE Final    Comment: (NOTE) The Xpert Xpress SARS-CoV-2/FLU/RSV assay is intended as an aid in  the diagnosis of influenza from Nasopharyngeal swab specimens and  should not be used as a sole basis for treatment. Nasal washings and  aspirates are unacceptable for Xpert Xpress SARS-CoV-2/FLU/RSV  testing. Fact Sheet for Patients: PinkCheek.be Fact Sheet for Healthcare Providers: GravelBags.it This test is not yet approved or cleared by the [place] FDA and  has been authorized for detection and/or diagnosis of SARS-CoV-2 by  FDA under an Emergency Use Authorization (EUA). This EUA will remain  in effect (meaning this test can be used) for the duration of the  Covid-19 declaration under Section 564(b)(1) of the Act, 21  U.S.C. section 360bbb-3(b)(1), unless the authorization is  terminated or revoked. Performed at [facility], [place] [address], [place], [place] [address]   Surgical pcr screen     Status: None   Collection Time: 10/30/19 10:35 PM   Specimen: Nasal Mucosa; Nasal Swab  Result Value Ref Range Status   MRSA, PCR NEGATIVE NEGATIVE Final   Staphylococcus aureus NEGATIVE NEGATIVE Final    Comment:  (NOTE) The Xpert SA Assay (FDA approved for NASAL specimens in patients 63 years of age and older), is one component of a comprehensive surveillance program. It is not intended to diagnose infection nor to guide or monitor treatment. Performed at [facility], [place] [address], [place], [place] [address]      Labs: BNP (last 3 results) No results for input(s): BNP in the last 8760 hours. Basic Metabolic Panel: Recent Labs  Lab 11/02/19 0541 11/03/19 0522  NA 132* 134*  K 3.6 4.5  CL 97* 99  CO2 28 29  GLUCOSE 117* 94  BUN 14 11  CREATININE 0.68 0.77  CALCIUM 7.9* 8.5*   Liver Function Tests: Recent Labs  Lab 11/02/19 0541  AST 23  ALT 21  ALKPHOS 52  BILITOT 1.2  PROT 5.3*  ALBUMIN 2.6*   No results for input(s): LIPASE, AMYLASE in the last 168 hours. No results for input(s): AMMONIA in the last 168 hours. CBC: Recent Labs  Lab 11/01/19 0755 11/02/19 0541 11/03/19 0522  WBC 16.3* 15.6* 12.4*  HGB 16.7 14.7 14.2  HCT 50.2 44.1 43.2  MCV 98.0 98.4 99.3  PLT 248 242 232   Cardiac Enzymes: No results for input(s): CKTOTAL, CKMB, CKMBINDEX, TROPONINI in the last 168 hours. BNP: Invalid input(s): POCBNP CBG: No results for input(s): GLUCAP in the last 168 hours. D-Dimer No results for input(s): DDIMER in the last 72 hours. Hgb A1c No results for input(s): HGBA1C in the last 72 hours. Lipid Profile No results for input(s): CHOL, HDL, LDLCALC, TRIG, CHOLHDL, LDLDIRECT in the last 72 hours. Thyroid function studies No results for input(s): TSH, T4TOTAL, T3FREE, THYROIDAB in the last 72 hours.  Invalid input(s): FREET3 Anemia work up No results for input(s): VITAMINB12, FOLATE, FERRITIN, TIBC, IRON, RETICCTPCT in the last 72 hours. Urinalysis    Component Value Date/Time   COLORURINE YELLOW 10/28/2019 1800   APPEARANCEUR CLEAR 10/28/2019 1800   LABSPEC 1.034 (H) 10/28/2019 1800   PHURINE 5.0 10/28/2019 1800   GLUCOSEU NEGATIVE  10/28/2019 1800   HGBUR SMALL (A) 10/28/2019 1800   BILIRUBINUR NEGATIVE 10/28/2019 1800   KETONESUR 20 (A) 10/28/2019 1800   PROTEINUR NEGATIVE 10/28/2019 1800   NITRITE NEGATIVE 10/28/2019 1800   LEUKOCYTESUR NEGATIVE 10/28/2019 1800   Sepsis Labs Invalid input(s): PROCALCITONIN,  WBC,  LACTICIDVEN Microbiology Recent Results (from the past 240 hour(s))  Respiratory Panel by RT PCR (Flu A&B, Covid) - Nasopharyngeal Swab     Status: None   Collection Time: 10/28/19  7:17 PM   Specimen: Nasopharyngeal Swab  Result Value Ref Range Status   SARS Coronavirus 2 by RT PCR NEGATIVE NEGATIVE Final    Comment: (NOTE) SARS-CoV-2 target nucleic acids are NOT DETECTED. The SARS-CoV-2 RNA is generally detectable in upper respiratoy specimens during the acute phase of infection. The lowest concentration of SARS-CoV-2 viral copies this assay can detect is 131 copies/mL. A negative result does not preclude SARS-Cov-2 infection and should not be used as the sole basis for treatment or other patient management decisions. A negative result may occur with  improper specimen collection/handling, submission  of specimen other than nasopharyngeal swab, presence of viral mutation(s) within the areas targeted by this assay, and inadequate number of viral copies (<131 copies/mL). A negative result must be combined with clinical observations, patient history, and epidemiological information. The expected result is Negative. Fact Sheet for Patients:  PinkCheek.be Fact Sheet for Healthcare Providers:  GravelBags.it This test is not yet ap proved or cleared by the [place] FDA and  has been authorized for detection and/or diagnosis of SARS-CoV-2 by FDA under an Emergency Use Authorization (EUA). This EUA will remain  in effect (meaning this test can be used) for the duration of the COVID-19 declaration under Section 564(b)(1) of the Act, 21  U.S.C. section 360bbb-3(b)(1), unless the authorization is terminated or revoked sooner.    Influenza A by PCR NEGATIVE NEGATIVE Final   Influenza B by PCR NEGATIVE NEGATIVE Final    Comment: (NOTE) The Xpert Xpress SARS-CoV-2/FLU/RSV assay is intended as an aid in  the diagnosis of influenza from Nasopharyngeal swab specimens and  should not be used as a sole basis for treatment. Nasal washings and  aspirates are unacceptable for Xpert Xpress SARS-CoV-2/FLU/RSV  testing. Fact Sheet for Patients: PinkCheek.be Fact Sheet for Healthcare Providers: GravelBags.it This test is not yet approved or cleared by the [place] FDA and  has been authorized for detection and/or diagnosis of SARS-CoV-2 by  FDA under an Emergency Use Authorization (EUA). This EUA will remain  in effect (meaning this test can be used) for the duration of the  Covid-19 declaration under Section 564(b)(1) of the Act, 21  U.S.C. section 360bbb-3(b)(1), unless the authorization is  terminated or revoked. Performed at [facility], [place] [address] [place], [place] [address]   Surgical pcr screen     Status: None   Collection Time: 10/30/19 10:35 PM   Specimen: Nasal Mucosa; Nasal Swab  Result Value Ref Range Status   MRSA, PCR NEGATIVE NEGATIVE Final   Staphylococcus aureus NEGATIVE NEGATIVE Final    Comment: (NOTE) The Xpert SA Assay (FDA approved for NASAL specimens in patients 12 years of age and older), is one component of a comprehensive surveillance program. It is not intended to diagnose infection nor to guide or monitor treatment. Performed at [facility], [place] [address], [place],  [address]      Time coordinating discharge: 35 minutes  SIGNED:   Aline August, MD  [facility] 11/05/2019, 8:57 AM

## 2019-11-05 NOTE — Consult Note (Addendum)
[place] Nurse ostomy follow up Stoma type/location: LLQ Colostomy Stomal assessment/size: oval stoma, 2 inches x 1 and 1/4 inch Peristomal assessment: resolving peristomal erythema with correct sizing of aperture and placement of skin barrier ring Treatment options for stomal/peristomal skin: See above Output: brown stool Ostomy pouching: 2pc. 2 and 3/4 inch pouching system with skin barrier ring  Education provided: Extended session with wife watching pouch change and taking notes. Has made pattern and cut out apterture previously.  Much more relaxed with today's change than on Monday. Patient is now independent with emptying. Reviewed lifting (no more than 15# initially), showering, planned vs emergent pouch changes, looking at the back of the skin barrier to determine remaining wear time.Reviewed sick day rules, diarrhea, diet. All questions answered to their expressed satisfaction.  Enrolled patient in [place] Start Discharge program: Yes  Supplies provided for 4 pouch changes.  Awaiting confirmation that [facility] is established. Ready for discharge from a [place] standpoint.  [place] nursing team will follow, and will remain available to this patient, the surgical,  nursing and medical teams.   Thanks, Maudie Flakes, MSN, RN, [place], Arther Abbott  Pager# [phone]

## 2019-11-05 NOTE — Progress Notes (Signed)
PT Cancellation Note  Patient Details Name: Wesley [name] MRN: [id number] DOB: 04-Jun-1941   Cancelled Treatment:    Reason Eval/Treat Not Completed: OT mentioned that wife was requesting PT f/u to practice stair negotiation. Attempted tx session-pt declined to participate at this time 2* pain. Verbally reviewed stair negotiation techniques (reciprocal pattern vs step to pattern) with use of handrail, depending on pt preference. No further questions/concerns from pt.      Doreatha Massed, PT Acute Rehabilitation

## 2019-11-05 NOTE — Progress Notes (Addendum)
Occupational Therapy Treatment Patient Details Name: Wesley [name] MRN: [id number] DOB: Mar 03, 1941 Today's Date: 11/05/2019    History of present illness 79 y.o. male with a history of CAD, HTN, HLD, OSA, BPH, GERD, and a couple months of abdominal pain, poor per oral intake, fatigue culminating in several days of constipation and abdominal distention. In the ED, a CT of the abdomen demonstrated distal partial obstruction with possible mass. Ht was admitted for colonoscopy 3/17. This showed obstructive mass with poor prep. Biopsy taken and ultimately showed only inflammation without dysplasia. General surgery performed sigmoid colectomy with end colostomy following urology placement of bilateral ureteral stents on 3/19.   OT comments  Pt progressing toward established OT goals. Pt sitting EOB upon arrival, he completed UB and LB washup and grooming at sink level with supervision to minguard assistance. Per conversation with RN for ostomy care, pt is demonstrating great progress and is independent with emptying his ostomy bag. He completed UB and LB dressing with supervision. Educated pt on energy conservation strategies to utilize during ADL/IADL and functional mobility. Feel pt's current level of functioning appropriate for d/c when medically stable with all additional OT needs to be addressed at next venue. Acute OT will sign off at this time. Thank you for referral.   Follow Up Recommendations  Home health OT;Supervision/Assistance - 24 hour    Equipment Recommendations  None recommended by OT    Recommendations for Other Services      Precautions / Restrictions Precautions Precautions: Fall Precaution Comments: colostomy LLQ Restrictions Weight Bearing Restrictions: No       Mobility Bed Mobility               General bed mobility comments: pt sitting EOB upon arrival  Transfers Overall transfer level: Needs assistance Equipment used: Rolling walker (2  wheeled) Transfers: Sit to/from Stand Sit to Stand: Supervision         General transfer comment: supervision for safety    Balance Overall balance assessment: Needs assistance Sitting-balance support: Feet supported;No upper extremity supported Sitting balance-Leahy Scale: Good     Standing balance support: Single extremity supported;During functional activity Standing balance-Leahy Scale: Fair Standing balance comment: demonstrates preference of use of single UE support while standing to complete grooming/bathing                           ADL either performed or assessed with clinical judgement   ADL Overall ADL's : Needs assistance/impaired     Grooming: Supervision/safety;Standing;Wash/dry face;Wash/dry hands;Oral care Grooming Details (indicate cue type and reason): at sink level  Upper Body Bathing: Supervision/ safety;Standing Upper Body Bathing Details (indicate cue type and reason): at sink level Lower Body Bathing: Supervison/ safety;Sit to/from stand   Upper Body Dressing : Supervision/safety;Sitting   Lower Body Dressing: Supervision/safety;Sit to/from stand;Cueing for sequencing Lower Body Dressing Details (indicate cue type and reason): pt donned LB body clothing using figure-4 method Toilet Transfer: Min guard;Ambulation;RW   Toileting- Water quality scientist and Hygiene: Min guard;Sit to/from stand       Functional mobility during ADLs: Min guard;Rolling walker General ADL Comments: pt completed grooming and fully body washup standing at sink level with minguard assistance for safety;educated pt on energy conservation strategies to utilize with ADL and functional mobiltiy     Vision       Perception     Praxis      Cognition Arousal/Alertness: Awake/alert Behavior During Therapy: [facility] for tasks assessed/performed Overall  Cognitive Status: Within Functional Limits for tasks assessed                                           Exercises     Shoulder Instructions       General Comments spouse present throughout the session    Pertinent Vitals/ Pain       Pain Assessment: 0-10 Pain Score: 4  Pain Location: abdomen Pain Descriptors / Indicators: Sore;Discomfort;Tender Pain Intervention(s): Limited activity within patient's tolerance;Monitored during session  Home Living                                          Prior Functioning/Environment              Frequency  Min 3X/week        Progress Toward Goals  OT Goals(current goals can now be found in the care plan section)  Progress towards OT goals: Progressing toward goals  Acute Rehab OT Goals Patient Stated Goal: discharge home today OT Goal Formulation: With patient/family Time For Goal Achievement: 11/15/19 Potential to Achieve Goals: Good ADL Goals Pt Will Perform Upper Body Dressing: with set-up;sitting Pt Will Perform Lower Body Dressing: with modified independence;sit to/from stand Pt Will Transfer to Toilet: with modified independence;ambulating Pt Will Perform Toileting - Clothing Manipulation and hygiene: with modified independence;sit to/from stand Additional ADL Goal #1: Pt will complete bed mobility at supervision level to prepare for OOB ADLs.  Plan Discharge plan remains appropriate    Co-evaluation                 AM-PAC OT "6 Clicks" Daily Activity     Outcome Measure   Help from another person eating meals?: None Help from another person taking care of personal grooming?: A Little Help from another person toileting, which includes using toliet, bedpan, or urinal?: A Little Help from another person bathing (including washing, rinsing, drying)?: A Little Help from another person to put on and taking off regular upper body clothing?: A Little Help from another person to put on and taking off regular lower body clothing?: A Little 6 Click Score: 19    End of Session Equipment Utilized  During Treatment: Rolling walker  OT Visit Diagnosis: Unsteadiness on feet (R26.81);Muscle weakness (generalized) (M62.81)   Activity Tolerance Patient tolerated treatment well   Patient Left in chair;with call bell/phone within reach;with family/visitor present   Nurse Communication Mobility status        Time: [id number] OT Time Calculation (min): 36 min  Charges: OT General Charges $OT Visit: 1 Visit OT Treatments $Self Care/Home Management : 23-37 mins  Helene Kelp OTR/L Acute Rehabilitation Services Office: [place] 11/05/2019, 12:32 PM

## 2019-11-06 ENCOUNTER — Ambulatory Visit: Payer: Medicare Other | Admitting: Gastroenterology

## 2019-11-07 ENCOUNTER — Encounter: Payer: Self-pay | Admitting: Internal Medicine

## 2019-11-07 ENCOUNTER — Telehealth: Payer: Self-pay | Admitting: Internal Medicine

## 2019-11-07 NOTE — Telephone Encounter (Signed)
Please see note below and advise  

## 2019-11-07 NOTE — Telephone Encounter (Signed)
Patient scheduled with Dr. Hilarie Fredrickson on 5/24.

## 2019-11-07 NOTE — Telephone Encounter (Signed)
Per Dr. Norman Herrlich pt just needs an office visit with him in 4-8 weeks and they will discuss colonoscopy at that time. Please schedule OV.

## 2019-11-07 NOTE — Telephone Encounter (Signed)
The colon mass after surgical resection of the sigmoid stricture was a benign inflammatory polyp; there was no evidence of malignancy I do recommend that he follow-up with me and we can discuss doing a colonoscopy through his descending colostomy but also checking the rectal and rectosigmoid stump This can be in 4 to 8 weeks Please let me know if he has additional questions

## 2019-11-07 NOTE — Telephone Encounter (Signed)
Patients wife calling- patient had flex sigmoid in hospital with Dr. Hilarie Fredrickson 3/17- and states that patient also had a colostomy done. Patients wife states that Dr. Hilarie Fredrickson did want to go forward with a colonoscopy for the patient but we also received a referral from [facility] Dr. Starla Link for a colonic mass. Patient is wondering do they need to make an office visit with Dr. Hilarie Fredrickson or what they need to do?

## 2019-11-08 DIAGNOSIS — E876 Hypokalemia: Secondary | ICD-10-CM | POA: Diagnosis not present

## 2019-11-08 DIAGNOSIS — J9811 Atelectasis: Secondary | ICD-10-CM | POA: Diagnosis not present

## 2019-11-08 DIAGNOSIS — I10 Essential (primary) hypertension: Secondary | ICD-10-CM | POA: Diagnosis not present

## 2019-11-08 DIAGNOSIS — K8689 Other specified diseases of pancreas: Secondary | ICD-10-CM | POA: Diagnosis not present

## 2019-11-08 DIAGNOSIS — K449 Diaphragmatic hernia without obstruction or gangrene: Secondary | ICD-10-CM | POA: Diagnosis not present

## 2019-11-08 DIAGNOSIS — M810 Age-related osteoporosis without current pathological fracture: Secondary | ICD-10-CM | POA: Diagnosis not present

## 2019-11-08 DIAGNOSIS — N200 Calculus of kidney: Secondary | ICD-10-CM | POA: Diagnosis not present

## 2019-11-08 DIAGNOSIS — D7589 Other specified diseases of blood and blood-forming organs: Secondary | ICD-10-CM | POA: Diagnosis not present

## 2019-11-08 DIAGNOSIS — J309 Allergic rhinitis, unspecified: Secondary | ICD-10-CM | POA: Diagnosis not present

## 2019-11-08 DIAGNOSIS — E785 Hyperlipidemia, unspecified: Secondary | ICD-10-CM | POA: Diagnosis not present

## 2019-11-08 DIAGNOSIS — G4733 Obstructive sleep apnea (adult) (pediatric): Secondary | ICD-10-CM | POA: Diagnosis not present

## 2019-11-08 DIAGNOSIS — M4315 Spondylolisthesis, thoracolumbar region: Secondary | ICD-10-CM | POA: Diagnosis not present

## 2019-11-08 DIAGNOSIS — M4316 Spondylolisthesis, lumbar region: Secondary | ICD-10-CM | POA: Diagnosis not present

## 2019-11-08 DIAGNOSIS — K760 Fatty (change of) liver, not elsewhere classified: Secondary | ICD-10-CM | POA: Diagnosis not present

## 2019-11-08 DIAGNOSIS — M4807 Spinal stenosis, lumbosacral region: Secondary | ICD-10-CM | POA: Diagnosis not present

## 2019-11-08 DIAGNOSIS — E7800 Pure hypercholesterolemia, unspecified: Secondary | ICD-10-CM | POA: Diagnosis not present

## 2019-11-08 DIAGNOSIS — I2510 Atherosclerotic heart disease of native coronary artery without angina pectoris: Secondary | ICD-10-CM | POA: Diagnosis not present

## 2019-11-08 DIAGNOSIS — Z433 Encounter for attention to colostomy: Secondary | ICD-10-CM | POA: Diagnosis not present

## 2019-11-08 DIAGNOSIS — I728 Aneurysm of other specified arteries: Secondary | ICD-10-CM | POA: Diagnosis not present

## 2019-11-08 DIAGNOSIS — K8020 Calculus of gallbladder without cholecystitis without obstruction: Secondary | ICD-10-CM | POA: Diagnosis not present

## 2019-11-08 DIAGNOSIS — M4317 Spondylolisthesis, lumbosacral region: Secondary | ICD-10-CM | POA: Diagnosis not present

## 2019-11-08 DIAGNOSIS — Z48815 Encounter for surgical aftercare following surgery on the digestive system: Secondary | ICD-10-CM | POA: Diagnosis not present

## 2019-11-08 DIAGNOSIS — I700 Atherosclerosis of aorta: Secondary | ICD-10-CM | POA: Diagnosis not present

## 2019-11-08 DIAGNOSIS — K4020 Bilateral inguinal hernia, without obstruction or gangrene, not specified as recurrent: Secondary | ICD-10-CM | POA: Diagnosis not present

## 2019-11-08 DIAGNOSIS — N400 Enlarged prostate without lower urinary tract symptoms: Secondary | ICD-10-CM | POA: Diagnosis not present

## 2019-11-09 DIAGNOSIS — I2510 Atherosclerotic heart disease of native coronary artery without angina pectoris: Secondary | ICD-10-CM | POA: Diagnosis not present

## 2019-11-09 DIAGNOSIS — Z433 Encounter for attention to colostomy: Secondary | ICD-10-CM | POA: Diagnosis not present

## 2019-11-09 DIAGNOSIS — G4733 Obstructive sleep apnea (adult) (pediatric): Secondary | ICD-10-CM | POA: Diagnosis not present

## 2019-11-09 DIAGNOSIS — N400 Enlarged prostate without lower urinary tract symptoms: Secondary | ICD-10-CM | POA: Diagnosis not present

## 2019-11-09 DIAGNOSIS — I10 Essential (primary) hypertension: Secondary | ICD-10-CM | POA: Diagnosis not present

## 2019-11-09 DIAGNOSIS — Z48815 Encounter for surgical aftercare following surgery on the digestive system: Secondary | ICD-10-CM | POA: Diagnosis not present

## 2019-11-10 DIAGNOSIS — G4733 Obstructive sleep apnea (adult) (pediatric): Secondary | ICD-10-CM | POA: Diagnosis not present

## 2019-11-10 DIAGNOSIS — I10 Essential (primary) hypertension: Secondary | ICD-10-CM | POA: Diagnosis not present

## 2019-11-10 DIAGNOSIS — N400 Enlarged prostate without lower urinary tract symptoms: Secondary | ICD-10-CM | POA: Diagnosis not present

## 2019-11-10 DIAGNOSIS — Z433 Encounter for attention to colostomy: Secondary | ICD-10-CM | POA: Diagnosis not present

## 2019-11-10 DIAGNOSIS — I2510 Atherosclerotic heart disease of native coronary artery without angina pectoris: Secondary | ICD-10-CM | POA: Diagnosis not present

## 2019-11-10 DIAGNOSIS — Z48815 Encounter for surgical aftercare following surgery on the digestive system: Secondary | ICD-10-CM | POA: Diagnosis not present

## 2019-11-13 DIAGNOSIS — G4733 Obstructive sleep apnea (adult) (pediatric): Secondary | ICD-10-CM | POA: Diagnosis not present

## 2019-11-13 DIAGNOSIS — Z48815 Encounter for surgical aftercare following surgery on the digestive system: Secondary | ICD-10-CM | POA: Diagnosis not present

## 2019-11-13 DIAGNOSIS — Z433 Encounter for attention to colostomy: Secondary | ICD-10-CM | POA: Diagnosis not present

## 2019-11-13 DIAGNOSIS — I10 Essential (primary) hypertension: Secondary | ICD-10-CM | POA: Diagnosis not present

## 2019-11-13 DIAGNOSIS — N400 Enlarged prostate without lower urinary tract symptoms: Secondary | ICD-10-CM | POA: Diagnosis not present

## 2019-11-13 DIAGNOSIS — I2510 Atherosclerotic heart disease of native coronary artery without angina pectoris: Secondary | ICD-10-CM | POA: Diagnosis not present

## 2019-11-18 DIAGNOSIS — Z433 Encounter for attention to colostomy: Secondary | ICD-10-CM | POA: Diagnosis not present

## 2019-11-18 DIAGNOSIS — Z48815 Encounter for surgical aftercare following surgery on the digestive system: Secondary | ICD-10-CM | POA: Diagnosis not present

## 2019-11-18 DIAGNOSIS — I10 Essential (primary) hypertension: Secondary | ICD-10-CM | POA: Diagnosis not present

## 2019-11-18 DIAGNOSIS — N400 Enlarged prostate without lower urinary tract symptoms: Secondary | ICD-10-CM | POA: Diagnosis not present

## 2019-11-18 DIAGNOSIS — I2510 Atherosclerotic heart disease of native coronary artery without angina pectoris: Secondary | ICD-10-CM | POA: Diagnosis not present

## 2019-11-18 DIAGNOSIS — G4733 Obstructive sleep apnea (adult) (pediatric): Secondary | ICD-10-CM | POA: Diagnosis not present

## 2019-11-21 DIAGNOSIS — Z48815 Encounter for surgical aftercare following surgery on the digestive system: Secondary | ICD-10-CM | POA: Diagnosis not present

## 2019-11-21 DIAGNOSIS — I10 Essential (primary) hypertension: Secondary | ICD-10-CM | POA: Diagnosis not present

## 2019-11-21 DIAGNOSIS — N400 Enlarged prostate without lower urinary tract symptoms: Secondary | ICD-10-CM | POA: Diagnosis not present

## 2019-11-21 DIAGNOSIS — G4733 Obstructive sleep apnea (adult) (pediatric): Secondary | ICD-10-CM | POA: Diagnosis not present

## 2019-11-21 DIAGNOSIS — Z433 Encounter for attention to colostomy: Secondary | ICD-10-CM | POA: Diagnosis not present

## 2019-11-21 DIAGNOSIS — I2510 Atherosclerotic heart disease of native coronary artery without angina pectoris: Secondary | ICD-10-CM | POA: Diagnosis not present

## 2019-11-25 DIAGNOSIS — G4733 Obstructive sleep apnea (adult) (pediatric): Secondary | ICD-10-CM | POA: Diagnosis not present

## 2019-11-25 DIAGNOSIS — Z48815 Encounter for surgical aftercare following surgery on the digestive system: Secondary | ICD-10-CM | POA: Diagnosis not present

## 2019-11-25 DIAGNOSIS — Z433 Encounter for attention to colostomy: Secondary | ICD-10-CM | POA: Diagnosis not present

## 2019-11-25 DIAGNOSIS — N400 Enlarged prostate without lower urinary tract symptoms: Secondary | ICD-10-CM | POA: Diagnosis not present

## 2019-11-25 DIAGNOSIS — I10 Essential (primary) hypertension: Secondary | ICD-10-CM | POA: Diagnosis not present

## 2019-11-25 DIAGNOSIS — I2510 Atherosclerotic heart disease of native coronary artery without angina pectoris: Secondary | ICD-10-CM | POA: Diagnosis not present

## 2019-11-26 DIAGNOSIS — G4733 Obstructive sleep apnea (adult) (pediatric): Secondary | ICD-10-CM | POA: Diagnosis not present

## 2019-11-26 DIAGNOSIS — Z433 Encounter for attention to colostomy: Secondary | ICD-10-CM | POA: Diagnosis not present

## 2019-11-26 DIAGNOSIS — N400 Enlarged prostate without lower urinary tract symptoms: Secondary | ICD-10-CM | POA: Diagnosis not present

## 2019-11-26 DIAGNOSIS — I10 Essential (primary) hypertension: Secondary | ICD-10-CM | POA: Diagnosis not present

## 2019-11-26 DIAGNOSIS — Z48815 Encounter for surgical aftercare following surgery on the digestive system: Secondary | ICD-10-CM | POA: Diagnosis not present

## 2019-11-26 DIAGNOSIS — I2510 Atherosclerotic heart disease of native coronary artery without angina pectoris: Secondary | ICD-10-CM | POA: Diagnosis not present

## 2019-12-01 DIAGNOSIS — Z48815 Encounter for surgical aftercare following surgery on the digestive system: Secondary | ICD-10-CM | POA: Diagnosis not present

## 2019-12-01 DIAGNOSIS — Z0000 Encounter for general adult medical examination without abnormal findings: Secondary | ICD-10-CM | POA: Diagnosis not present

## 2019-12-01 DIAGNOSIS — N400 Enlarged prostate without lower urinary tract symptoms: Secondary | ICD-10-CM | POA: Diagnosis not present

## 2019-12-01 DIAGNOSIS — Z433 Encounter for attention to colostomy: Secondary | ICD-10-CM | POA: Diagnosis not present

## 2019-12-01 DIAGNOSIS — R7303 Prediabetes: Secondary | ICD-10-CM | POA: Diagnosis not present

## 2019-12-01 DIAGNOSIS — E7800 Pure hypercholesterolemia, unspecified: Secondary | ICD-10-CM | POA: Diagnosis not present

## 2019-12-01 DIAGNOSIS — I10 Essential (primary) hypertension: Secondary | ICD-10-CM | POA: Diagnosis not present

## 2019-12-01 DIAGNOSIS — I2510 Atherosclerotic heart disease of native coronary artery without angina pectoris: Secondary | ICD-10-CM | POA: Diagnosis not present

## 2019-12-01 DIAGNOSIS — G4733 Obstructive sleep apnea (adult) (pediatric): Secondary | ICD-10-CM | POA: Diagnosis not present

## 2019-12-01 DIAGNOSIS — N401 Enlarged prostate with lower urinary tract symptoms: Secondary | ICD-10-CM | POA: Diagnosis not present

## 2019-12-01 DIAGNOSIS — E559 Vitamin D deficiency, unspecified: Secondary | ICD-10-CM | POA: Diagnosis not present

## 2019-12-03 DIAGNOSIS — I2510 Atherosclerotic heart disease of native coronary artery without angina pectoris: Secondary | ICD-10-CM | POA: Diagnosis not present

## 2019-12-03 DIAGNOSIS — I10 Essential (primary) hypertension: Secondary | ICD-10-CM | POA: Diagnosis not present

## 2019-12-03 DIAGNOSIS — Z433 Encounter for attention to colostomy: Secondary | ICD-10-CM | POA: Diagnosis not present

## 2019-12-03 DIAGNOSIS — Z48815 Encounter for surgical aftercare following surgery on the digestive system: Secondary | ICD-10-CM | POA: Diagnosis not present

## 2019-12-03 DIAGNOSIS — N400 Enlarged prostate without lower urinary tract symptoms: Secondary | ICD-10-CM | POA: Diagnosis not present

## 2019-12-03 DIAGNOSIS — G4733 Obstructive sleep apnea (adult) (pediatric): Secondary | ICD-10-CM | POA: Diagnosis not present

## 2019-12-08 DIAGNOSIS — E876 Hypokalemia: Secondary | ICD-10-CM | POA: Diagnosis not present

## 2019-12-08 DIAGNOSIS — E7800 Pure hypercholesterolemia, unspecified: Secondary | ICD-10-CM | POA: Diagnosis not present

## 2019-12-08 DIAGNOSIS — Z23 Encounter for immunization: Secondary | ICD-10-CM | POA: Diagnosis not present

## 2019-12-08 DIAGNOSIS — I700 Atherosclerosis of aorta: Secondary | ICD-10-CM | POA: Diagnosis not present

## 2019-12-08 DIAGNOSIS — I728 Aneurysm of other specified arteries: Secondary | ICD-10-CM | POA: Diagnosis not present

## 2019-12-08 DIAGNOSIS — N400 Enlarged prostate without lower urinary tract symptoms: Secondary | ICD-10-CM | POA: Diagnosis not present

## 2019-12-08 DIAGNOSIS — E785 Hyperlipidemia, unspecified: Secondary | ICD-10-CM | POA: Diagnosis not present

## 2019-12-08 DIAGNOSIS — J9811 Atelectasis: Secondary | ICD-10-CM | POA: Diagnosis not present

## 2019-12-08 DIAGNOSIS — Z48815 Encounter for surgical aftercare following surgery on the digestive system: Secondary | ICD-10-CM | POA: Diagnosis not present

## 2019-12-08 DIAGNOSIS — D7589 Other specified diseases of blood and blood-forming organs: Secondary | ICD-10-CM | POA: Diagnosis not present

## 2019-12-08 DIAGNOSIS — G4733 Obstructive sleep apnea (adult) (pediatric): Secondary | ICD-10-CM | POA: Diagnosis not present

## 2019-12-08 DIAGNOSIS — J309 Allergic rhinitis, unspecified: Secondary | ICD-10-CM | POA: Diagnosis not present

## 2019-12-08 DIAGNOSIS — N200 Calculus of kidney: Secondary | ICD-10-CM | POA: Diagnosis not present

## 2019-12-08 DIAGNOSIS — Z433 Encounter for attention to colostomy: Secondary | ICD-10-CM | POA: Diagnosis not present

## 2019-12-08 DIAGNOSIS — I2510 Atherosclerotic heart disease of native coronary artery without angina pectoris: Secondary | ICD-10-CM | POA: Diagnosis not present

## 2019-12-08 DIAGNOSIS — K449 Diaphragmatic hernia without obstruction or gangrene: Secondary | ICD-10-CM | POA: Diagnosis not present

## 2019-12-08 DIAGNOSIS — K760 Fatty (change of) liver, not elsewhere classified: Secondary | ICD-10-CM | POA: Diagnosis not present

## 2019-12-08 DIAGNOSIS — N401 Enlarged prostate with lower urinary tract symptoms: Secondary | ICD-10-CM | POA: Diagnosis not present

## 2019-12-08 DIAGNOSIS — M4807 Spinal stenosis, lumbosacral region: Secondary | ICD-10-CM | POA: Diagnosis not present

## 2019-12-08 DIAGNOSIS — M4317 Spondylolisthesis, lumbosacral region: Secondary | ICD-10-CM | POA: Diagnosis not present

## 2019-12-08 DIAGNOSIS — K4020 Bilateral inguinal hernia, without obstruction or gangrene, not specified as recurrent: Secondary | ICD-10-CM | POA: Diagnosis not present

## 2019-12-08 DIAGNOSIS — K8689 Other specified diseases of pancreas: Secondary | ICD-10-CM | POA: Diagnosis not present

## 2019-12-08 DIAGNOSIS — M810 Age-related osteoporosis without current pathological fracture: Secondary | ICD-10-CM | POA: Diagnosis not present

## 2019-12-08 DIAGNOSIS — M4316 Spondylolisthesis, lumbar region: Secondary | ICD-10-CM | POA: Diagnosis not present

## 2019-12-08 DIAGNOSIS — M4315 Spondylolisthesis, thoracolumbar region: Secondary | ICD-10-CM | POA: Diagnosis not present

## 2019-12-08 DIAGNOSIS — K8020 Calculus of gallbladder without cholecystitis without obstruction: Secondary | ICD-10-CM | POA: Diagnosis not present

## 2019-12-08 DIAGNOSIS — Z0000 Encounter for general adult medical examination without abnormal findings: Secondary | ICD-10-CM | POA: Diagnosis not present

## 2019-12-08 DIAGNOSIS — F320 Major depressive disorder, single episode, mild: Secondary | ICD-10-CM | POA: Diagnosis not present

## 2019-12-08 DIAGNOSIS — I10 Essential (primary) hypertension: Secondary | ICD-10-CM | POA: Diagnosis not present

## 2019-12-08 DIAGNOSIS — G629 Polyneuropathy, unspecified: Secondary | ICD-10-CM | POA: Diagnosis not present

## 2019-12-10 DIAGNOSIS — Z48815 Encounter for surgical aftercare following surgery on the digestive system: Secondary | ICD-10-CM | POA: Diagnosis not present

## 2019-12-10 DIAGNOSIS — N400 Enlarged prostate without lower urinary tract symptoms: Secondary | ICD-10-CM | POA: Diagnosis not present

## 2019-12-10 DIAGNOSIS — G4733 Obstructive sleep apnea (adult) (pediatric): Secondary | ICD-10-CM | POA: Diagnosis not present

## 2019-12-10 DIAGNOSIS — I10 Essential (primary) hypertension: Secondary | ICD-10-CM | POA: Diagnosis not present

## 2019-12-10 DIAGNOSIS — I2510 Atherosclerotic heart disease of native coronary artery without angina pectoris: Secondary | ICD-10-CM | POA: Diagnosis not present

## 2019-12-10 DIAGNOSIS — Z433 Encounter for attention to colostomy: Secondary | ICD-10-CM | POA: Diagnosis not present

## 2019-12-11 DIAGNOSIS — I2510 Atherosclerotic heart disease of native coronary artery without angina pectoris: Secondary | ICD-10-CM | POA: Diagnosis not present

## 2019-12-11 DIAGNOSIS — I10 Essential (primary) hypertension: Secondary | ICD-10-CM | POA: Diagnosis not present

## 2019-12-11 DIAGNOSIS — Z433 Encounter for attention to colostomy: Secondary | ICD-10-CM | POA: Diagnosis not present

## 2019-12-11 DIAGNOSIS — G4733 Obstructive sleep apnea (adult) (pediatric): Secondary | ICD-10-CM | POA: Diagnosis not present

## 2019-12-11 DIAGNOSIS — Z48815 Encounter for surgical aftercare following surgery on the digestive system: Secondary | ICD-10-CM | POA: Diagnosis not present

## 2019-12-11 DIAGNOSIS — N400 Enlarged prostate without lower urinary tract symptoms: Secondary | ICD-10-CM | POA: Diagnosis not present

## 2019-12-15 DIAGNOSIS — Z433 Encounter for attention to colostomy: Secondary | ICD-10-CM | POA: Diagnosis not present

## 2019-12-15 DIAGNOSIS — I2510 Atherosclerotic heart disease of native coronary artery without angina pectoris: Secondary | ICD-10-CM | POA: Diagnosis not present

## 2019-12-15 DIAGNOSIS — Z48815 Encounter for surgical aftercare following surgery on the digestive system: Secondary | ICD-10-CM | POA: Diagnosis not present

## 2019-12-15 DIAGNOSIS — I10 Essential (primary) hypertension: Secondary | ICD-10-CM | POA: Diagnosis not present

## 2019-12-29 ENCOUNTER — Encounter: Payer: Self-pay | Admitting: *Deleted

## 2020-01-05 ENCOUNTER — Ambulatory Visit: Payer: Medicare Other | Admitting: Internal Medicine

## 2020-01-06 ENCOUNTER — Telehealth: Payer: Self-pay | Admitting: Internal Medicine

## 2020-01-06 NOTE — Telephone Encounter (Signed)
Spoke with pts wife and gave her the contact number for [place] nurse, [phone]. States pts husband where the appliance attaches if red and inflamed and she is having issues getting the appliance to stick and with leakage. Dose not want any further irritation to occur. Wife will make contact with the ostomy nurse.

## 2020-01-07 ENCOUNTER — Encounter: Payer: Self-pay | Admitting: Internal Medicine

## 2020-01-07 ENCOUNTER — Ambulatory Visit (INDEPENDENT_AMBULATORY_CARE_PROVIDER_SITE_OTHER): Payer: Medicare Other | Admitting: Internal Medicine

## 2020-01-07 VITALS — BP 120/80 | HR 76 | Ht 69.0 in | Wt 184.0 lb

## 2020-01-07 DIAGNOSIS — Z933 Colostomy status: Secondary | ICD-10-CM

## 2020-01-07 DIAGNOSIS — Z1211 Encounter for screening for malignant neoplasm of colon: Secondary | ICD-10-CM | POA: Diagnosis not present

## 2020-01-07 DIAGNOSIS — K56699 Other intestinal obstruction unspecified as to partial versus complete obstruction: Secondary | ICD-10-CM | POA: Diagnosis not present

## 2020-01-07 MED ORDER — SUPREP BOWEL PREP KIT 17.5-3.13-1.6 GM/177ML PO SOLN: 1.0000 | ORAL | 0 refills | Status: DC

## 2020-01-07 NOTE — Patient Instructions (Signed)
You have been scheduled for a colonoscopy. Please follow written instructions given to you at your visit today.  Please pick up your prep supplies at the pharmacy within the next 1-3 days. If you use inhalers (even only as needed), please bring them with you on the day of your procedure. Your physician has requested that you go to www.startemmi.com and enter the access code given to you at your visit today. This web site gives a general overview about your procedure. However, you should still follow specific instructions given to you by our office regarding your preparation for the procedure. ___________________________ If you are age 30 or older, your body mass index should be between 23-30. Your Body mass index is 27.17 kg/m. If this is out of the aforementioned range listed, please consider follow up with your Primary Care Provider.  If you are age 46 or younger, your body mass index should be between 19-25. Your Body mass index is 27.17 kg/m. If this is out of the aformentioned range listed, please consider follow up with your Primary Care Provider.  ____________________________ Due to recent changes in healthcare laws, you may see the results of your imaging and laboratory studies on MyChart before your provider has had a chance to review them.  We understand that in some cases there may be results that are confusing or concerning to you. Not all laboratory results come back in the same time frame and the provider may be waiting for multiple results in order to interpret others.  Please give [place] 48 hours in order for your provider to thoroughly review all the results before contacting the office for clarification of your results.

## 2020-01-07 NOTE — Progress Notes (Signed)
Subjective:    Patient ID: Wesley [name], male    DOB: January 28, 1941, 79 y.o.   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  He is here today with his wife.  He also has a history of CAD, hypertension, sleep apnea and BPH.  I performed a flexible sigmoidoscopy, initially plan for possible colonoscopy, during his hospitalization which showed many sigmoid diverticula and a stricture approximately 30 cm from the dentate line.  There was a mass at the time which was biopsied and found to be inflammatory.  He then went for a sigmoid resection performed by Dr. Brantley Stage on 11/03/2019.  The pathology from the resection showed inflammatory polyp with organizing serosal adhesions.  There was a morphologically benign lymph node.  He has recovered very well and is doing well.  He has had a little bit of leaking of late from his ostomy causing skin discomfort and mild breakdown.  He and his wife are in touch very recently with ostomy nurse who has given instructions and his discomfort is better and he has not had leaking in the last several days.  No abdominal pain.  The ostomy functions well.  No blood in the ostomy effluent or melena.  Good appetite.  No trouble with heartburn, dysphagia or odynophagia.  He has never had a full colonoscopy.  He will be meeting with Dr. Leighton Ruff on January 14, 2020 to discuss ostomy takedown  Review of Systems As per HPI, otherwise negative  Current Medications, Allergies, Past Medical History, Past Surgical History, Family History and Social History were reviewed in [facility] record.     Objective:   Physical Exam BP 120/80   Pulse 76   Ht 5\' 9"  (1.753 m)   Wt 184 lb (83.5 kg)   BMI  27.17 kg/m  Gen: awake, alert, NAD HEENT: anicteric CV: RRR, no mrg Pulm: CTA b/l Abd: soft, left mid abdomen colostomy without tenderness, there is mild erythema with minimal excoriation below the ostomy covered now with medical tape and then the ostomy compliance Ext: no c/c/e Neuro: nonfocal  CT ABDOMEN AND PELVIS WITH CONTRAST   TECHNIQUE: Multidetector CT imaging of the abdomen and pelvis was performed using the standard protocol following bolus administration of intravenous contrast. Automatic exposure control utilized.   CONTRAST:  OMNIPAQUE IOHEXOL 350 MG/ML SOLN, 118mL OMNIPAQUE IOHEXOL 300 MG/ML SOLN   COMPARISON:  August 12, 2013   FINDINGS: Lower chest: Normal heart size. Right main coronary calcification. Small paraesophageal hernia. Calcified atherosclerosis of the thoracic aorta. Mild subpleural atelectasis. No dependent pleural effusion.   Hepatobiliary: Several small gallstones within the dependent gallbladder lumen. No gallbladder wall thickening or dilatation. No calcified stone within the cystic or common bile duct. Mildly dilated 7 mm caliber of the proximal common bile duct with normal distal tapering. Fatty infiltration of the liver. Normal hepatic size and contours without ascites. Patent portal veins.   Pancreas: Mild atrophy. No acute pancreatitis.   Spleen: Normal.   Adrenals/Urinary Tract: Normal right and left adrenal glands. Symmetrical moderate bilateral perinephric inflammatory change, likely medical renal disease. Normal bilateral renal cortical thickness. A 4.5 cm 8 Hounsfield unit simple appearing right renal lower pole exophytic cyst and smaller simple appearing bilateral cortical cysts. Nonobstructing right micro nephrolithiasis.  No hydronephrosis or ureteral calculus bilaterally.   Stomach/Bowel: Normal appendix, coronal image 39. Moderate gaseous distension throughout the colon with air-fluid levels to the level of the mid  sigmoid colon where stool or soft tissue mucosal thickening is present in the bowel lumen. Decompressed distal sigmoid colon. Small amount of steatotic stool in the rectum. Mild diverticulosis of the distal sigmoid and descending colon. Decompressed small bowel and gastric lumen without an apparent mucosal abnormality. 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 Aortic calcified atherosclerosis without an abdominal aortic aneurysmal dilatation.   Reproductive: Mild prostatomegaly.   Other: Small bilateral inguinal herniations of fat without strangulation or incarceration.   Musculoskeletal: Diffuse mild bone demineralization. Moderate skeletal degenerative change. Grade 1 anterolisthesis of L4 on L5 and grade 1 retrolisthesis of L5 on S1 and T12 on L1, likely degenerative. Pseudoarticulation of the L4 and L5 spinous processes. Severe osseous narrowing of the left L5-S1 neural foramen. Probable benign bone islands in the bilateral femur.   These results will be called to the ordering clinician or representative by the Radiologist Assistant, and communication documented in the PACS or [employer].   IMPRESSION: Partial distal colonic obstruction with moderate gaseous distension of the colon with air-fluid levels to the level of the mid sigmoid colon where there is either stool, colo-colonic intussusception, and/or abnormal and possibly malignant soft tissue mucosal mass. Correlation with colonoscopy recommended. No acute diverticulitis.   Uncomplicated cholelithiasis.   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   Nonobstructing right-sided micronephrolithiasis.   Small paraesophageal hernia.   Fatty infiltration of the liver.   Mild prostatomegaly.   Severe osseous narrowing of the left  L5-S1 neural foramen, potentially impinging on the exiting left L5 nerve root.   Aortic Atherosclerosis (ICD10-I70.0).     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   1.  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  I recommended a complete colonoscopy via his descending colostomy and also per rectum to check the rectal stump.  We discussed the risk, benefits and alternatives to colonoscopy and he is agreeable and wishes to proceed.  We went over the prep together today.  He will meet with Dr. Marcello Moores soon to discuss colostomy takedown and that she would likely schedule the surgery after his colonoscopy.  His ostomy is functioning normally and he has been in contact and should maintain contact with the ostomy nurse for general ostomy care as needed --Colonoscopy in the [place] via ostomy and to evaluate rectal stump --He will meet with surgery for consideration of ostomy takedown after colonoscopy  30 minutes total spent today including patient facing time, coordination of care, reviewing medical history/procedures/pertinent radiology studies, and documentation of the encounter.

## 2020-01-08 DIAGNOSIS — R42 Dizziness and giddiness: Secondary | ICD-10-CM | POA: Diagnosis not present

## 2020-01-14 ENCOUNTER — Ambulatory Visit: Payer: Self-pay | Admitting: General Surgery

## 2020-01-14 NOTE — H&P (Signed)
The patient is a 79 year old male who presents with diverticulitis. 79 year old male who presents to the office for evaluation for colostomy reversal. 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 He has slowly recovered since then. He is tolerating a diet and having good bowel function through his ostomy. He is scheduled to undergo a colonoscopy next week. He did have a proximal sigmoid stricture and it appears that a small amount of sigmoid colon remains in reviewing his operative note. I also reviewed his preoperative CT scan in preparation for surgery.   Problem List/Past Medical Wesley [name], Wesley [name]; [id number] Q000111Q PM) S/P UMBILICAL HERNIA REPAIR, FOLLOW-UP EXAM (99991111) UMBILICAL HERNIA WITHOUT OBSTRUCTION OR GANGRENE (K42.9) ENCOUNTER FOR REMOVAL OF STAPLES (Z48.02) POST-OPERATIVE STATE (Z98.890) PARTIAL OBSTRUCTION OF COLON (K56.600) DIVERTICULAR DISEASE (K57.90)  Past Surgical History Wesley [name], Wesley [name]; Q000111Q 4:05 PM) Colon Removal - Partial Cataract Surgery Bilateral. Shoulder Surgery Right. Colon Polyp Removal - Colonoscopy  Diagnostic Studies History Wesley [name], Wesley [name]; Q000111Q 4:05 PM) Colonoscopy within last year 5-10 years ago  Allergies ([name], [name]; 01/14/2020 4:00 PM) Demerol *ANALGESICS - OPIOID* Phenergan *ANTIHISTAMINES* Promethazine HCl *ANTIHISTAMINES* Allergies Reconciled  Medication History Wesley [name], Wesley [name]; Q000111Q 4:30 PM) Claritin (10MG  Capsule, Oral as needed) Active. Finasteride (5MG  Tablet, Oral) Active. Flonase (50MCG/ACT Suspension, Nasal as needed) Active. Lipitor (80MG  Tablet, Oral) Active. Lisinopril (10MG  Tablet, Oral) Active. Vitamin D (50000UNIT Capsule, Oral) Active. Omega 3 (1200MG  Capsule, Oral) Active. Ondansetron HCl (4MG  Tablet, Oral) Active. Medications Reconciled Neomycin Sulfate (500MG   Tablet, 2 (two) Oral SEE NOTE, Taken starting 01/14/2020) Active. (TAKE TWO TABLETS AT 2 PM, 3 PM, AND 10 PM THE DAY PRIOR TO SURGERY) Flagyl (500MG  Tablet, 2 (two) Oral SEE NOTE, Taken starting 01/14/2020) Active. (Take at 2pm, 3pm, and 10pm the day prior to your colon operation)  Social History Wesley [name], Wesley [name]; Q000111Q 4:05 PM) Alcohol use Moderate alcohol use. Tobacco use Never smoker. Caffeine use Coffee. No drug use  Family History Wesley [name], Wesley [name]; Q000111Q 4:05 PM) Diabetes Mellitus Mother. Cerebrovascular Accident Brother. Heart Disease Brother, Father. Melanoma Brother. Heart disease in male family member before age 37  Other Problems Wesley [name], Wesley [name]; Q000111Q 4:30 PM) No pertinent past medical history Arthritis Cancer Enlarged Prostate High blood pressure Sleep Apnea Umbilical Hernia Repair    Vitals ([name]; 01/14/2020 4:00 PM) 01/14/2020 4:00 PM Weight: 186.38 lb Height: 68in Body Surface Area: 1.98 m Body Mass Index: 28.34 kg/m  Temp.: 97.49F  Pulse: 85 (Regular)  BP: 124/76(Sitting, Left Arm, Standard)        Physical Exam Wesley [name] Wesley [name]; Q000111Q 4:31 PM)  General Mental Status-Alert. General Appearance-Cooperative.  Abdomen Inspection Skin - Scar - Periumbilical. Colostomy - Left Lower Quadrant.    Assessment & Plan Wesley [name] Wesley [name]; Q000111Q 4:27 PM)  DIVERTICULAR DISEASE (K57.90) Impression: 79 year old male status post resection of a diverticular stricture in March 2021. This was done due to severe obstruction. We discussed typical risk of colostomy reversal, which include damage to adjacent structures, infection, hernia, and anastomotic leak. Given his body habitus, we discussed his hernia risk is probably close to 20%. We discussed the possible need for ureteral stents. I will discuss his surgery with his original surgeon to determine if these will be necessary. He is scheduled to undergo  colonoscopy next week. Once this is complete, we will work on getting him scheduled for surgery. The surgery and anatomy were  described to the patient as well as the risks of surgery and the possible complications. These include: Bleeding, deep abdominal infections and possible wound complications such as hernia and infection, damage to adjacent structures, leak of surgical connections, which can lead to other surgeries and possibly an ostomy, possible need for other procedures, such as abscess drains in radiology, possible prolonged hospital stay, possible diarrhea from removal of part of the colon, possible constipation from narcotics, possible bowel, bladder or sexual dysfunction if having rectal surgery, prolonged fatigue/weakness or appetite loss, possible early recurrence of of disease, possible complications of their medical problems such as heart disease or arrhythmias or lung problems, death (less than 1%). I believe the patient understands and wishes to proceed with the surgery.

## 2020-01-19 ENCOUNTER — Telehealth: Payer: Self-pay | Admitting: Internal Medicine

## 2020-01-19 NOTE — Telephone Encounter (Signed)
Pt is scheduled for a colonoscopy 01/22/20 and inquired how to prep with colostomy bag.

## 2020-01-19 NOTE — Telephone Encounter (Signed)
Patient wanted to know if he should stay away from beans. I advised that he should stay away from anything with the word "bean" in it.

## 2020-01-22 ENCOUNTER — Encounter: Payer: Self-pay | Admitting: Internal Medicine

## 2020-01-22 ENCOUNTER — Ambulatory Visit (AMBULATORY_SURGERY_CENTER): Payer: Medicare Other | Admitting: Internal Medicine

## 2020-01-22 ENCOUNTER — Other Ambulatory Visit: Payer: Self-pay | Admitting: Urology

## 2020-01-22 ENCOUNTER — Other Ambulatory Visit: Payer: Self-pay

## 2020-01-22 VITALS — BP 106/82 | HR 102 | Temp 99.1°F | Resp 16 | Ht 69.0 in | Wt 184.0 lb

## 2020-01-22 DIAGNOSIS — Z1211 Encounter for screening for malignant neoplasm of colon: Secondary | ICD-10-CM

## 2020-01-22 DIAGNOSIS — D122 Benign neoplasm of ascending colon: Secondary | ICD-10-CM

## 2020-01-22 DIAGNOSIS — D120 Benign neoplasm of cecum: Secondary | ICD-10-CM

## 2020-01-22 DIAGNOSIS — D123 Benign neoplasm of transverse colon: Secondary | ICD-10-CM | POA: Diagnosis not present

## 2020-01-22 MED ORDER — SODIUM CHLORIDE 0.9 % IV SOLN: 500.0000 mL | Freq: Once | INTRAVENOUS | Status: DC

## 2020-01-22 NOTE — Patient Instructions (Signed)
Please read all of the handouts given to you by your  Recovery room nurse.  Thank-you for choosing [place] for your healthcare needs today.  YOU HAD AN ENDOSCOPIC PROCEDURE TODAY AT [facility]:   Refer to the procedure report that was given to you for any specific questions about what was found during the examination.  If the procedure report does not answer your questions, please call your gastroenterologist to clarify.  If you requested that your care partner not be given the details of your procedure findings, then the procedure report has been included in a sealed envelope for you to review at your convenience later.  YOU SHOULD EXPECT: Some feelings of bloating in the abdomen. Passage of more gas than usual.  Walking can help get rid of the air that was put into your GI tract during the procedure and reduce the bloating. If you had a lower endoscopy (such as a colonoscopy or flexible sigmoidoscopy) you may notice spotting of blood in your stool or on the toilet paper. If you underwent a bowel prep for your procedure, you may not have a normal bowel movement for a few days.  Please Note:  You might notice some irritation and congestion in your nose or some drainage.  This is from the oxygen used during your procedure.  There is no need for concern and it should clear up in a day or so.  SYMPTOMS TO REPORT IMMEDIATELY:   Following lower endoscopy (colonoscopy or flexible sigmoidoscopy):  Excessive amounts of blood in the stool  Significant tenderness or worsening of abdominal pains  Swelling of the abdomen that is new, acute  Fever of 100F or higher   For urgent or emergent issues, a gastroenterologist can be reached at any hour by calling [phone]. Do not use MyChart messaging for urgent concerns.    DIET:  We do recommend a small meal at first, but then you may proceed to your regular diet.  Drink plenty of fluids but you should avoid alcoholic beverages for 24  hours.  ACTIVITY:  You should plan to take it easy for the rest of today and you should NOT DRIVE or use heavy machinery until tomorrow (because of the sedation medicines used during the test).    FOLLOW UP: Our staff will call the number listed on your records 48-72 hours following your procedure to check on you and address any questions or concerns that you may have regarding the information given to you following your procedure. If we do not reach you, we will leave a message.  We will attempt to reach you two times.  During this call, we will ask if you have developed any symptoms of COVID 19. If you develop any symptoms (ie: fever, flu-like symptoms, shortness of breath, cough etc.) before then, please call [phone].  If you test positive for Covid 19 in the 2 weeks post procedure, please call and report this information to [place].    If any biopsies were taken you will be contacted by phone or by letter within the next 1-3 weeks.  Please call us at [phone] if you have not heard about the biopsies in 3 weeks.    SIGNATURES/CONFIDENTIALITY: You and/or your care partner have signed paperwork which will be entered into your electronic medical record.  These signatures attest to the fact that that the information above on your After Visit Summary has been reviewed and is understood.  Full responsibility of the confidentiality of this  discharge information lies with you and/or your care-partner. 

## 2020-01-22 NOTE — Progress Notes (Signed)
VS- Wesley [name]  Pt has received both covid vaccines

## 2020-01-22 NOTE — Progress Notes (Signed)
Called to room to assist during endoscopic procedure.  Patient ID and intended procedure confirmed with present staff. Received instructions for my participation in the procedure from the performing physician.  

## 2020-01-22 NOTE — Progress Notes (Signed)
Patient's wife placed colostomy bag on the patient.  She does it all of the time according to the patient. She was at the bedside during discharge instructions. The doctor did state that she could put the bad on to the patient.

## 2020-01-22 NOTE — Progress Notes (Signed)
PT taken to PACU. Monitors in place. VSS. Report given to RN.  Propofol given at beginning and throughout procedure to maintain safe and appropriate comfort level. VO/MD.  K.Latreshia Beauchaine, CRNA 

## 2020-01-22 NOTE — Op Note (Signed)
[place] Patient Name: Wesley [name] Procedure Date: 01/22/2020 11:15 AM MRN: [id number] Endoscopist: Jerene Bears , MD Age: 79 Referring MD:  Date of Birth: November 01, 1940 Gender: Male Account #: [id number] Procedure:                Colonoscopy Indications:              Screening for colorectal malignant neoplasm, This                            is the patient's first colonoscopy; patient with                            history of colonic obstruction in March 2021,                            flexible sigmoidoscopy prior to surgery 10/29/2019                            with poor prep revealing 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 Patient                            returns today for screening prior to planned ostomy                            takedown and reanastomosis Medicines:                Monitored Anesthesia Care Procedure:                Pre-Anesthesia Assessment:                           - Prior to the procedure, a History and Physical                            was performed, and patient medications and                            allergies were reviewed. The patient's tolerance of                            previous anesthesia was also reviewed. The risks                            and benefits of the procedure and the sedation  options and risks were discussed with the patient.                            All questions were answered, and informed consent                            was obtained. Prior Anticoagulants: The patient has                            taken no previous anticoagulant or antiplatelet                            agents. ASA Grade Assessment: III - A patient with                            severe systemic disease. After  reviewing the risks                            and benefits, the patient was deemed in                            satisfactory condition to undergo the procedure.                           After obtaining informed consent, the colonoscope                            was passed under direct vision. Throughout the                            procedure, the patient's blood pressure, pulse, and                            oxygen saturations were monitored continuously. The                            Colonoscope was introduced through the descending                            colostomy and advanced to the cecum, identified by                            appendiceal orifice and ileocecal valve. The                            colonoscopy was performed without difficulty. The                            patient tolerated the procedure well. The quality                            of the bowel preparation was fair. The ileocecal  valve, appendiceal orifice, and rectum were                            photographed. Scope In: 11:36:51 AM Scope Out: 11:56:30 AM Scope Withdrawal Time: 0 hours 16 minutes 35 seconds  Total Procedure Duration: 0 hours 19 minutes 39 seconds  Findings:                 The digital rectal exam was normal.                           There was evidence of a prior surgical anastomosis                            in the recto-sigmoid colon. This was characterized                            by an intact appearance. Solid stool was present in                            the rectum/rectosigmoid which precluded                            visualization. No polyps or mass lesions seen. Few                            rectosigmoid diverticula were seen. Internal                            hemorrhoids were seen on direct view.                           There was evidence of a widely patent end colostomy                            in the proximal sigmoid colon. This was                             characterized by healthy appearing mucosa.                           A 4 mm polyp was found in the cecum. The polyp was                            sessile. The polyp was removed with a cold snare.                            Resection and retrieval were complete.                           A 7 mm polyp was found in the ascending colon. The                            polyp was sessile. The polyp was removed with  a                            cold snare. Resection and retrieval were complete.                           A 4 mm polyp was found in the transverse colon. The                            polyp was sessile. The polyp was removed with a                            cold snare. Resection and retrieval were complete.                           Multiple small and large-mouthed diverticula were                            found in the proximal sigmoid colon, descending                            colon, transverse colon and ascending colon. Complications:            No immediate complications. Estimated Blood Loss:     Estimated blood loss was minimal. Impression:               - Preparation of the colon was fair.                           - Surgical intervention/rectosigmoid cuff,                            characterized by an intact appearance.                           - Stool in the rectum precluding complete                            visualization. No polyps seen. Small internal                            hemorrhoids.                           - Widely patent end colostomy with healthy                            appearing mucosa in the proximal sigmoid colon.                           - One 4 mm polyp in the cecum, removed with a cold                            snare. Resected and retrieved.                           -  One 7 mm polyp in the ascending colon, removed                            with a cold snare. Resected and retrieved.                           - One 4  mm polyp in the transverse colon, removed                            with a cold snare. Resected and retrieved.                           - Diverticulosis in the proximal sigmoid colon, in                            the descending colon, in the transverse colon and                            in the ascending colon. Recommendation:           - Patient has a contact number available for                            emergencies. The signs and symptoms of potential                            delayed complications were discussed with the                            patient. Return to normal activities tomorrow.                            Written discharge instructions were provided to the                            patient.                           - Resume previous diet.                           - Continue present medications.                           - Await pathology results.                           - No recommendation at this time regarding repeat                            colonoscopy. Jerene Bears, MD 01/22/2020 12:14:56 PM This report has been signed electronically.

## 2020-01-26 ENCOUNTER — Telehealth: Payer: Self-pay

## 2020-01-26 ENCOUNTER — Encounter: Payer: Self-pay | Admitting: Internal Medicine

## 2020-01-26 NOTE — Telephone Encounter (Signed)
  Follow up Call-  Call back number 01/22/2020  Post procedure Call Back phone  # [phone]  Permission to leave phone message Yes  Some recent data might be hidden     Patient questions:  Do you have a fever, pain , or abdominal swelling? No. Pain Score  0 *  Have you tolerated food without any problems? Yes.    Have you been able to return to your normal activities? Yes.    Do you have any questions about your discharge instructions: Diet   No. Medications  No. Follow up visit  No.  Do you have questions or concerns about your Care? No.  Actions: * If pain score is 4 or above: No action needed, pain <4.  1. Have you developed a fever since your procedure? no  2.   Have you had an respiratory symptoms (SOB or cough) since your procedure? no  3.   Have you tested positive for COVID 19 since your procedure no  4.   Have you had any family members/close contacts diagnosed with the COVID 19 since your procedure?  No   If yes to any of these questions please route to Joylene John, RN and Erenest Rasher, RN

## 2020-02-19 NOTE — Patient Instructions (Addendum)
DUE TO COVID-19 ONLY ONE VISITOR IS ALLOWED TO COME WITH YOU AND STAY IN THE WAITING ROOM ONLY DURING PRE OP AND PROCEDURE DAY OF SURGERY. THE 2 VISITORS  MAY VISIT WITH YOU AFTER SURGERY IN YOUR PRIVATE ROOM DURING VISITING HOURS ONLY!  YOU NEED TO HAVE A COVID 19 TEST ON 03-08-20 @__11 :00_____, THIS TEST MUST BE DONE BEFORE SURGERY, COME  [place] , [address].  ([place]) ONCE YOUR COVID TEST IS COMPLETED, PLEASE BEGIN THE QUARANTINE INSTRUCTIONS AS OUTLINED IN YOUR HANDOUT.                Wesley [name]    Your procedure is scheduled on: 03-11-20   Report to [facility] Main  Entrance    Report to Short Stay at 5:30 AM     Call this number if you have problems the morning of surgery [phone]    Remember: Please consume a Clear Liquid Diet the Day before your surgery to prevent dehydration. Also, please follow your surgeon's prep instructions.  DRINK 2 PRESURGERY ENSURE DRINKS THE NIGHT BEFORE SURGERY AT 10:00 PM   AND 1 PRESURGERY DRINK THE DAY OF THE PROCEDURE 3 HOURS PRIOR TO SCHEDULED SURGERY.    NOTHING BY MOUTH EXCEPT CLEAR LIQUIDS UNTIL THREE HOURS PRIOR TO SCHEDULED SURGERY.   PLEASE FINISH PRESURGERY ENSURE DRINK PER SURGEON ORDER 3 HOURS PRIOR TO SCHEDULED SURGERY TIME WHICH NEEDS TO BE COMPLETED AT 4:15 AM.    CLEAR LIQUID DIET   Foods Allowed                                                                     Foods Excluded  Coffee and tea, regular and decaf                             liquids that you cannot  Plain Jell-O any favor except red or purple                                           see through such as: Fruit ices (not with fruit pulp)                                     milk, soups, orange juice  Iced Popsicles                                    All solid food Carbonated beverages, regular and diet                                    Cranberry, grape and apple juices Sports drinks like  Gatorade Lightly seasoned clear broth or consume(fat free) Sugar, honey syrup  Sample Menu Breakfast  Lunch                                     Supper Cranberry juice                    Beef broth                            Chicken broth Jell-O                                     Grape juice                           Apple juice Coffee or tea                        Jell-O                                      Popsicle                                                Coffee or tea                        Coffee or tea  _____________________________________________________________________        Take these medicines the morning of surgery with A SIP OF WATER: Loratadine (Claritin) and Finasteride (Proscar). You may also use your nasal spray. Bring your mask and tubing and Ostomy supplies.  BRUSH YOUR TEETH MORNING OF SURGERY AND RINSE YOUR MOUTH OUT, NO CHEWING GUM CANDY OR MINTS.                                You may not have any metal on your body including hair pins and              piercings     Do not wear jewelry, cologne, lotions, powders or deodorant                Men may shave face and neck.   Do not bring valuables to the hospital. Hydro.  Contacts, dentures or bridgework may not be worn into surgery.  You may bring a small over night bag       Special Instructions: N/A              Please read over the following fact sheets you were given: _____________________________________________________________________             [facility] - Preparing for Surgery Before surgery, you can play an important role.  Because skin is not sterile, your skin needs to be as free of germs as possible.  You can reduce the number of germs on your skin by washing with CHG (chlorahexidine gluconate) soap before surgery.  CHG is an antiseptic  cleaner which kills germs and bonds with the skin to continue  killing germs even after washing. Please DO NOT use if you have an allergy to CHG or antibacterial soaps.  If your skin becomes reddened/irritated stop using the CHG and inform your nurse when you arrive at Short Stay. Do not shave (including legs and underarms) for at least 48 hours prior to the first CHG shower.  You may shave your face/neck. Please follow these instructions carefully:  1.  Shower with CHG Soap the night before surgery and the  morning of Surgery.  2.  If you choose to wash your hair, wash your hair first as usual with your  normal  shampoo.  3.  After you shampoo, rinse your hair and body thoroughly to remove the  shampoo.                                        4.  Use CHG as you would any other liquid soap.  You can apply chg directly  to the skin and wash                       Gently with a scrungie or clean washcloth.  5.  Apply the CHG Soap to your body ONLY FROM THE NECK DOWN.   Do not use on face/ open                           Wound or open sores. Avoid contact with eyes, ears mouth and genitals (private parts).                       Wash face,  Genitals (private parts) with your normal soap.             6.  Wash thoroughly, paying special attention to the area where your surgery  will be performed.  7.  Thoroughly rinse your body with warm water from the neck down.  8.  DO NOT shower/wash with your normal soap after using and rinsing off  the CHG Soap.                9.  Pat yourself dry with a clean towel.            10.  Wear clean pajamas.            11.  Place clean sheets on your bed the night of your first shower and do not  sleep with pets. Day of Surgery : Do not apply any lotions/deodorants the morning of surgery.  Please wear clean clothes to the hospital/surgery center.  FAILURE TO FOLLOW THESE INSTRUCTIONS MAY RESULT IN THE CANCELLATION OF YOUR SURGERY PATIENT SIGNATURE_________________________________  NURSE  SIGNATURE__________________________________  ________________________________________________________________________

## 2020-02-19 NOTE — Progress Notes (Signed)
COVID Vaccine Completed: Date COVID Vaccine completed: COVID vaccine manufacturer: Pfizer    Moderna   [employer]   PCP - Deland Pretty, MD Cardiologist - Candee Furbish, MD  Chest x-ray -  EKG -  Stress Test -  ECHO -  Cardiac Cath -   Sleep Study -  CPAP -   Fasting Blood Sugar -  Checks Blood Sugar _____ times a day  Blood Thinner Instructions: Aspirin Instructions: Last Dose:  Anesthesia review:   Patient denies shortness of breath, fever, cough and chest pain at PAT appointment   Patient verbalized understanding of instructions that were given to them at the PAT appointment. Patient was also instructed that they will need to review over the PAT instructions again at home before surgery.

## 2020-03-01 ENCOUNTER — Encounter (HOSPITAL_COMMUNITY)
Admission: RE | Admit: 2020-03-01 | Discharge: 2020-03-01 | Disposition: A | Discharge status: Home / Self Care | Payer: Medicare Other | Source: Ambulatory Visit | Attending: General Surgery | Admitting: General Surgery

## 2020-03-01 ENCOUNTER — Encounter (HOSPITAL_COMMUNITY): Payer: Self-pay

## 2020-03-01 ENCOUNTER — Telehealth: Payer: Self-pay | Admitting: Cardiology

## 2020-03-01 ENCOUNTER — Other Ambulatory Visit: Payer: Self-pay

## 2020-03-01 DIAGNOSIS — Z01818 Encounter for other preprocedural examination: Secondary | ICD-10-CM | POA: Insufficient documentation

## 2020-03-01 DIAGNOSIS — I2510 Atherosclerotic heart disease of native coronary artery without angina pectoris: Secondary | ICD-10-CM | POA: Diagnosis not present

## 2020-03-01 DIAGNOSIS — I10 Essential (primary) hypertension: Secondary | ICD-10-CM | POA: Insufficient documentation

## 2020-03-01 LAB — BASIC METABOLIC PANEL
Anion gap: 7 (ref 5–15)
BUN: 17 mg/dL (ref 8–23)
CO2: 27 mmol/L (ref 22–32)
Calcium: 9.5 mg/dL (ref 8.9–10.3)
Chloride: 105 mmol/L (ref 98–111)
Creatinine, Ser: 1.07 mg/dL (ref 0.61–1.24)
GFR calc Af Amer: >60 mL/min (ref >60)
GFR calc non Af Amer: >60 mL/min (ref >60)
Glucose, Bld: 99 mg/dL (ref 70–99)
Potassium: 5.4 mmol/L — ABNORMAL HIGH (ref 3.5–5.1)
Sodium: 139 mmol/L (ref 135–145)

## 2020-03-01 LAB — CBC
HCT: 45.8 % (ref 39.0–52.0)
Hemoglobin: 15.1 g/dL (ref 13.0–17.0)
MCH: 33.1 pg (ref 26.0–34.0)
MCHC: 33 g/dL (ref 30.0–36.0)
MCV: 100.4 fL — ABNORMAL HIGH (ref 80.0–100.0)
Platelets: 219 10*3/uL (ref 150–400)
RBC: 4.56 MIL/uL (ref 4.22–5.81)
RDW: 14.4 % (ref 11.5–15.5)
WBC: 6.6 10*3/uL (ref 4.0–10.5)
nRBC: 0 % (ref 0.0–0.2)

## 2020-03-01 NOTE — Telephone Encounter (Signed)
Patient is having a reverse colostomy. He states he was asked how long it had been since he has seen his cardiologist and he stated it had been over a year. He is now concerned about this and wants to know if he should schedule with Dr. Marlou Porch. Dr. Kingsley Plan is booked up until 04/12/20 but the patient states that this is too long for him to wait. I did not see anything earlier with an APP. He is unsure if he will need surgical clearance but figured he would call the office to see when the next appt is.  Please advise.

## 2020-03-01 NOTE — Progress Notes (Signed)
COVID Vaccine Completed:yes Date COVID Vaccine completed:09/29/19 COVID vaccine manufacturer: Pfizer   PCP - Dr. Audie Pinto Cardiologist - Dr Jerilynn Mages. [place]  Chest x-ray - no EKG - 03/01/20 Stress Test - 2017 ECHO - 2014 Cardiac Cath - 2000,08  Sleep Study - yes CPAP - yes  Fasting Blood Sugar - NA Checks Blood Sugar _____ times a day  Blood Thinner Instructions:ASA Aspirin Instructions:none Last Dose: Pt states that he stopped taking it about a month ago around 01/31/20  Anesthesia review:   Patient denies shortness of breath, fever, cough and chest pain at PAT appointment Yes   Patient verbalized understanding of instructions that were given to them at the PAT appointment. Patient was also instructed that they will need to review over the PAT instructions again at home before surgery.  Yes No SOB with stairs or ADLs. Pt played tennis for years but not much lately

## 2020-03-01 NOTE — Telephone Encounter (Signed)
I s/w the pt in regards to his upcoming surgery. Pt informed me was told by Dr. Leighton Ruff with [facility] today that they will not be needing cardiac clearance. Therefore the pt said he will call back after he has had his surgery to schedule an appt with Dr. Marlou Porch.

## 2020-03-01 NOTE — Anesthesia Preprocedure Evaluation (Deleted)
Anesthesia Evaluation    Reviewed: Unable to perform ROS - Chart review only  Airway        Dental   Pulmonary           Cardiovascular hypertension,      Neuro/Psych    GI/Hepatic   Endo/Other    Renal/GU      Musculoskeletal   Abdominal   Peds  Hematology   Anesthesia Other Findings   Reproductive/Obstetrics                             Anesthesia Physical Anesthesia Plan Anesthesia Quick Evaluation

## 2020-03-08 ENCOUNTER — Other Ambulatory Visit (HOSPITAL_COMMUNITY)
Admission: RE | Admit: 2020-03-08 | Discharge: 2020-03-08 | Disposition: A | Discharge status: Home / Self Care | Payer: Medicare Other | Source: Ambulatory Visit | Attending: General Surgery | Admitting: General Surgery

## 2020-03-08 DIAGNOSIS — Z20822 Contact with and (suspected) exposure to covid-19: Secondary | ICD-10-CM | POA: Insufficient documentation

## 2020-03-08 DIAGNOSIS — Z01812 Encounter for preprocedural laboratory examination: Secondary | ICD-10-CM | POA: Insufficient documentation

## 2020-03-08 LAB — SARS CORONAVIRUS 2 (TAT 6-24 HRS): SARS Coronavirus 2: NEGATIVE

## 2020-03-10 ENCOUNTER — Encounter (HOSPITAL_COMMUNITY): Payer: Self-pay | Admitting: General Surgery

## 2020-03-10 MED ORDER — BUPIVACAINE LIPOSOME 1.3 % IJ SUSP
20.0000 mL | Freq: Once | INTRAMUSCULAR | Status: DC
  Filled 2020-03-10: qty 20

## 2020-03-10 NOTE — Anesthesia Preprocedure Evaluation (Addendum)
Anesthesia Evaluation  Patient identified by MRN, date of birth, ID band Patient awake    Reviewed: Allergy & Precautions, NPO status , Patient's Chart, lab work & pertinent test results, reviewed documented beta blocker date and time   Airway Mallampati: II  TM Distance: >3 FB Neck ROM: Full    Dental no notable dental hx. (+) Teeth Intact, Caps   Pulmonary sleep apnea and Continuous Positive Airway Pressure Ventilation ,    Pulmonary exam normal breath sounds clear to auscultation       Cardiovascular Exercise Tolerance: Good hypertension, Pt. on home beta blockers and Pt. on medications + CAD  Normal cardiovascular exam Rhythm:Regular Rate:Normal     Neuro/Psych negative neurological ROS  negative psych ROS   GI/Hepatic Neg liver ROS, hiatal hernia, GERD  Medicated and Controlled,Status - Colostomy   Endo/Other  Hyperchoesterolemia  Renal/GU negative Renal ROS  negative genitourinary   Musculoskeletal  (+) Arthritis , Osteoarthritis,    Abdominal   Peds  Hematology Hx/o macrocytosis   Anesthesia Other Findings Status colostomy  Reproductive/Obstetrics                           Anesthesia Physical Anesthesia Plan  ASA: III  Anesthesia Plan: General   Post-op Pain Management:    Induction: Intravenous and Cricoid pressure planned  PONV Risk Score and Plan: 4 or greater and Ondansetron, Dexamethasone and Treatment may vary due to age or medical condition  Airway Management Planned: Oral ETT  Additional Equipment:   Intra-op Plan:   Post-operative Plan: Extubation in OR  Informed Consent: I have reviewed the patients History and Physical, chart, labs and discussed the procedure including the risks, benefits and alternatives for the proposed anesthesia with the patient or authorized representative who has indicated his/her understanding and acceptance.     Dental advisory  given  Plan Discussed with: CRNA and Anesthesiologist  Anesthesia Plan Comments:                                         Anesthesia Evaluation  Patient identified by MRN, date of birth, ID band Patient awake    Reviewed: Allergy & Precautions, NPO status , Patient's Chart, lab work & pertinent test results  History of Anesthesia Complications Negative for: history of anesthetic complications  Airway Mallampati: II       Dental no notable dental hx. (+) Dental Advisory Given   Pulmonary sleep apnea and Continuous Positive Airway Pressure Ventilation ,    Pulmonary exam normal        Cardiovascular hypertension, Pt. on medications + CAD  Normal cardiovascular exam  Notes Recorded by Jerline Pain, MD on 04/11/2016 at 5:11 PM EDT  Nuclear stress EF: 68%.  There was no ST segment deviation noted during stress.  The study is normal. no ischemia. no evidence of previous MI  This is a low risk study. Reassuring Candee Furbish, MD   Neuro/Psych negative neurological ROS     GI/Hepatic Neg liver ROS, GERD  ,  Endo/Other  negative endocrine ROS  Renal/GU negative Renal ROS     Musculoskeletal negative musculoskeletal ROS (+)   Abdominal   Peds  Hematology negative hematology ROS (+)   Anesthesia Other Findings Day of surgery medications reviewed with the patient.  Reproductive/Obstetrics  Anesthesia Physical Anesthesia Plan  ASA: III  Anesthesia Plan: General   Post-op Pain Management:    Induction: Intravenous, Rapid sequence and Cricoid pressure planned  PONV Risk Score and Plan: 4 or greater and Ondansetron, Dexamethasone, Midazolam and Diphenhydramine  Airway Management Planned: Oral ETT  Additional Equipment:   Intra-op Plan:   Post-operative Plan: Extubation in OR  Informed Consent: I have reviewed the patients History and Physical, chart, labs and discussed the procedure  including the risks, benefits and alternatives for the proposed anesthesia with the patient or authorized representative who has indicated his/her understanding and acceptance.     Dental advisory given  Plan Discussed with: Anesthesiologist  Anesthesia Plan Comments:        Anesthesia Quick Evaluation  Anesthesia Quick Evaluation

## 2020-03-11 ENCOUNTER — Other Ambulatory Visit: Payer: Self-pay

## 2020-03-11 ENCOUNTER — Encounter (HOSPITAL_COMMUNITY)
Admission: RE | Disposition: A | Discharge status: Home / Self Care | Payer: Self-pay | Source: Ambulatory Visit | Attending: General Surgery

## 2020-03-11 ENCOUNTER — Encounter (HOSPITAL_COMMUNITY): Payer: Self-pay | Admitting: General Surgery

## 2020-03-11 ENCOUNTER — Inpatient Hospital Stay (HOSPITAL_COMMUNITY): Payer: Medicare Other | Admitting: Physician Assistant

## 2020-03-11 ENCOUNTER — Inpatient Hospital Stay (HOSPITAL_COMMUNITY): Payer: Medicare Other | Admitting: Anesthesiology

## 2020-03-11 ENCOUNTER — Inpatient Hospital Stay (HOSPITAL_COMMUNITY)
Admission: RE | Admit: 2020-03-11 | Discharge: 2020-03-14 | DRG: 331 | Disposition: A | Discharge status: Home / Self Care | Payer: Medicare Other | Attending: General Surgery | Admitting: General Surgery

## 2020-03-11 DIAGNOSIS — G4730 Sleep apnea, unspecified: Secondary | ICD-10-CM | POA: Diagnosis not present

## 2020-03-11 DIAGNOSIS — Z408 Encounter for other prophylactic surgery: Secondary | ICD-10-CM | POA: Diagnosis not present

## 2020-03-11 DIAGNOSIS — Z885 Allergy status to narcotic agent status: Secondary | ICD-10-CM | POA: Diagnosis not present

## 2020-03-11 DIAGNOSIS — I10 Essential (primary) hypertension: Secondary | ICD-10-CM | POA: Diagnosis present

## 2020-03-11 DIAGNOSIS — Z933 Colostomy status: Secondary | ICD-10-CM | POA: Diagnosis not present

## 2020-03-11 DIAGNOSIS — Z888 Allergy status to other drugs, medicaments and biological substances status: Secondary | ICD-10-CM

## 2020-03-11 DIAGNOSIS — K219 Gastro-esophageal reflux disease without esophagitis: Secondary | ICD-10-CM | POA: Diagnosis not present

## 2020-03-11 DIAGNOSIS — N400 Enlarged prostate without lower urinary tract symptoms: Secondary | ICD-10-CM | POA: Diagnosis present

## 2020-03-11 DIAGNOSIS — K660 Peritoneal adhesions (postprocedural) (postinfection): Secondary | ICD-10-CM | POA: Diagnosis present

## 2020-03-11 DIAGNOSIS — Z8719 Personal history of other diseases of the digestive system: Secondary | ICD-10-CM | POA: Diagnosis not present

## 2020-03-11 DIAGNOSIS — Z9842 Cataract extraction status, left eye: Secondary | ICD-10-CM

## 2020-03-11 DIAGNOSIS — Z433 Encounter for attention to colostomy: Secondary | ICD-10-CM | POA: Diagnosis not present

## 2020-03-11 DIAGNOSIS — Z20822 Contact with and (suspected) exposure to covid-19: Secondary | ICD-10-CM | POA: Diagnosis present

## 2020-03-11 DIAGNOSIS — M19012 Primary osteoarthritis, left shoulder: Secondary | ICD-10-CM | POA: Diagnosis present

## 2020-03-11 DIAGNOSIS — I2510 Atherosclerotic heart disease of native coronary artery without angina pectoris: Secondary | ICD-10-CM | POA: Diagnosis present

## 2020-03-11 DIAGNOSIS — Z9841 Cataract extraction status, right eye: Secondary | ICD-10-CM

## 2020-03-11 DIAGNOSIS — Z79899 Other long term (current) drug therapy: Secondary | ICD-10-CM

## 2020-03-11 DIAGNOSIS — K5730 Diverticulosis of large intestine without perforation or abscess without bleeding: Secondary | ICD-10-CM | POA: Diagnosis not present

## 2020-03-11 HISTORY — PX: XI ROBOTIC ASSISTED COLOSTOMY TAKEDOWN: SHX6828

## 2020-03-11 HISTORY — PX: CYSTOSCOPY WITH STENT PLACEMENT: SHX5790

## 2020-03-11 SURGERY — CLOSURE, COLOSTOMY, ROBOT-ASSISTED
Anesthesia: General | Site: Abdomen

## 2020-03-11 MED ORDER — LACTATED RINGERS IV SOLN: INTRAVENOUS | Status: DC

## 2020-03-11 MED ORDER — HYDROMORPHONE HCL 1 MG/ML IJ SOLN
0.5000 mg | INTRAMUSCULAR | Status: DC | PRN
  Filled 2020-03-11: qty 0.5

## 2020-03-11 MED ORDER — SODIUM CHLORIDE 0.9 % IV SOLN
2.0000 g | Freq: Two times a day (BID) | INTRAVENOUS | Status: AC
  Administered 2020-03-11: 2 g via INTRAVENOUS
  Filled 2020-03-11: qty 2

## 2020-03-11 MED ORDER — BUPIVACAINE LIPOSOME 1.3 % IJ SUSP
INTRAMUSCULAR | Status: DC | PRN
  Administered 2020-03-11: 20 mL

## 2020-03-11 MED ORDER — HEPARIN SODIUM (PORCINE) 5000 UNIT/ML IJ SOLN
5000.0000 [IU] | Freq: Once | INTRAMUSCULAR | Status: AC
  Administered 2020-03-11: 5000 [IU] via SUBCUTANEOUS
  Filled 2020-03-11: qty 1

## 2020-03-11 MED ORDER — EPHEDRINE 5 MG/ML INJ
INTRAVENOUS | Status: AC
  Filled 2020-03-11: qty 10

## 2020-03-11 MED ORDER — FENTANYL CITRATE (PF) 250 MCG/5ML IJ SOLN
INTRAMUSCULAR | Status: DC | PRN
  Administered 2020-03-11: 50 ug via INTRAVENOUS
  Administered 2020-03-11: 100 ug via INTRAVENOUS
  Administered 2020-03-11 (×5): 50 ug via INTRAVENOUS

## 2020-03-11 MED ORDER — LIDOCAINE HCL 2 % IJ SOLN
INTRAMUSCULAR | Status: AC
  Filled 2020-03-11: qty 20

## 2020-03-11 MED ORDER — SODIUM CHLORIDE 0.9 % IV SOLN
2.0000 g | INTRAVENOUS | Status: AC
  Administered 2020-03-11: 2 g via INTRAVENOUS
  Filled 2020-03-11: qty 2

## 2020-03-11 MED ORDER — KCL IN DEXTROSE-NACL 20-5-0.45 MEQ/L-%-% IV SOLN
INTRAVENOUS | Status: DC
  Filled 2020-03-11: qty 1000

## 2020-03-11 MED ORDER — ACETAMINOPHEN 500 MG PO TABS
1000.0000 mg | ORAL_TABLET | Freq: Four times a day (QID) | ORAL | Status: DC
  Administered 2020-03-11 – 2020-03-14 (×8): 1000 mg via ORAL
  Filled 2020-03-11 (×8): qty 2

## 2020-03-11 MED ORDER — ENSURE SURGERY PO LIQD
237.0000 mL | Freq: Two times a day (BID) | ORAL | Status: DC
  Administered 2020-03-11 – 2020-03-13 (×4): 237 mL via ORAL

## 2020-03-11 MED ORDER — LIDOCAINE 2% (20 MG/ML) 5 ML SYRINGE
INTRAMUSCULAR | Status: DC | PRN
  Administered 2020-03-11: 100 mg via INTRAVENOUS

## 2020-03-11 MED ORDER — GABAPENTIN 300 MG PO CAPS
300.0000 mg | ORAL_CAPSULE | Freq: Two times a day (BID) | ORAL | Status: DC
  Administered 2020-03-11 – 2020-03-13 (×6): 300 mg via ORAL
  Filled 2020-03-11 (×6): qty 1

## 2020-03-11 MED ORDER — FENTANYL CITRATE (PF) 100 MCG/2ML IJ SOLN
INTRAMUSCULAR | Status: AC
  Filled 2020-03-11: qty 2

## 2020-03-11 MED ORDER — GABAPENTIN 300 MG PO CAPS
300.0000 mg | ORAL_CAPSULE | ORAL | Status: AC
  Administered 2020-03-11: 300 mg via ORAL
  Filled 2020-03-11: qty 1

## 2020-03-11 MED ORDER — ACETAMINOPHEN 500 MG PO TABS
1000.0000 mg | ORAL_TABLET | ORAL | Status: AC
  Administered 2020-03-11: 1000 mg via ORAL
  Filled 2020-03-11: qty 2

## 2020-03-11 MED ORDER — SACCHAROMYCES BOULARDII 250 MG PO CAPS
250.0000 mg | ORAL_CAPSULE | Freq: Two times a day (BID) | ORAL | Status: DC
  Administered 2020-03-11 – 2020-03-13 (×6): 250 mg via ORAL
  Filled 2020-03-11 (×6): qty 1

## 2020-03-11 MED ORDER — BUPIVACAINE-EPINEPHRINE (PF) 0.25% -1:200000 IJ SOLN
INTRAMUSCULAR | Status: AC
  Filled 2020-03-11: qty 30

## 2020-03-11 MED ORDER — ALVIMOPAN 12 MG PO CAPS
12.0000 mg | ORAL_CAPSULE | Freq: Two times a day (BID) | ORAL | Status: DC
  Administered 2020-03-12: 12 mg via ORAL
  Filled 2020-03-11 (×2): qty 1

## 2020-03-11 MED ORDER — SODIUM CHLORIDE 0.9 % IR SOLN
Status: DC | PRN
  Administered 2020-03-11: 1000 mL via INTRAVESICAL

## 2020-03-11 MED ORDER — LACTATED RINGERS IR SOLN
Status: DC | PRN
  Administered 2020-03-11: 1000 mL

## 2020-03-11 MED ORDER — CHLORHEXIDINE GLUCONATE 0.12 % MT SOLN
15.0000 mL | Freq: Once | OROMUCOSAL | Status: AC
  Administered 2020-03-11: 15 mL via OROMUCOSAL

## 2020-03-11 MED ORDER — KETAMINE HCL 10 MG/ML IJ SOLN
INTRAMUSCULAR | Status: DC | PRN
  Administered 2020-03-11: 40 mg via INTRAVENOUS

## 2020-03-11 MED ORDER — LORATADINE 10 MG PO TABS
10.0000 mg | ORAL_TABLET | Freq: Every day | ORAL | Status: DC
  Administered 2020-03-12 – 2020-03-13 (×2): 10 mg via ORAL
  Filled 2020-03-11 (×2): qty 1

## 2020-03-11 MED ORDER — ONDANSETRON HCL 4 MG/2ML IJ SOLN: 4.0000 mg | Freq: Four times a day (QID) | INTRAMUSCULAR | Status: DC | PRN

## 2020-03-11 MED ORDER — ALUM & MAG HYDROXIDE-SIMETH 200-200-20 MG/5ML PO SUSP: 30.0000 mL | Freq: Four times a day (QID) | ORAL | Status: DC | PRN

## 2020-03-11 MED ORDER — STERILE WATER FOR INJECTION IJ SOLN
INTRAMUSCULAR | Status: AC
  Filled 2020-03-11: qty 10

## 2020-03-11 MED ORDER — FENTANYL CITRATE (PF) 250 MCG/5ML IJ SOLN
INTRAMUSCULAR | Status: AC
  Filled 2020-03-11: qty 5

## 2020-03-11 MED ORDER — DIPHENHYDRAMINE HCL 12.5 MG/5ML PO ELIX: 12.5000 mg | ORAL_SOLUTION | Freq: Four times a day (QID) | ORAL | Status: DC | PRN

## 2020-03-11 MED ORDER — LIDOCAINE 2% (20 MG/ML) 5 ML SYRINGE
INTRAMUSCULAR | Status: DC | PRN
  Administered 2020-03-11: 1 mg/kg/h via INTRAVENOUS

## 2020-03-11 MED ORDER — BUPIVACAINE-EPINEPHRINE 0.25% -1:200000 IJ SOLN
INTRAMUSCULAR | Status: DC | PRN
  Administered 2020-03-11: 30 mL

## 2020-03-11 MED ORDER — ONDANSETRON HCL 4 MG/2ML IJ SOLN
INTRAMUSCULAR | Status: AC
  Filled 2020-03-11: qty 2

## 2020-03-11 MED ORDER — ONDANSETRON HCL 4 MG/2ML IJ SOLN
INTRAMUSCULAR | Status: DC | PRN
  Administered 2020-03-11: 4 mg via INTRAVENOUS

## 2020-03-11 MED ORDER — LISINOPRIL 10 MG PO TABS
10.0000 mg | ORAL_TABLET | Freq: Every morning | ORAL | Status: DC
  Administered 2020-03-11 – 2020-03-13 (×3): 10 mg via ORAL
  Filled 2020-03-11 (×3): qty 1

## 2020-03-11 MED ORDER — DEXAMETHASONE SODIUM PHOSPHATE 10 MG/ML IJ SOLN
INTRAMUSCULAR | Status: DC | PRN
  Administered 2020-03-11: 10 mg via INTRAVENOUS

## 2020-03-11 MED ORDER — ENOXAPARIN SODIUM 40 MG/0.4ML ~~LOC~~ SOLN
40.0000 mg | SUBCUTANEOUS | Status: DC
  Administered 2020-03-12 – 2020-03-13 (×2): 40 mg via SUBCUTANEOUS
  Filled 2020-03-11 (×2): qty 0.4

## 2020-03-11 MED ORDER — 0.9 % SODIUM CHLORIDE (POUR BTL) OPTIME
TOPICAL | Status: DC | PRN
  Administered 2020-03-11: 2000 mL

## 2020-03-11 MED ORDER — ROCURONIUM BROMIDE 10 MG/ML (PF) SYRINGE
PREFILLED_SYRINGE | INTRAVENOUS | Status: AC
  Filled 2020-03-11: qty 10

## 2020-03-11 MED ORDER — EPHEDRINE SULFATE-NACL 50-0.9 MG/10ML-% IV SOSY
PREFILLED_SYRINGE | INTRAVENOUS | Status: DC | PRN
  Administered 2020-03-11 (×4): 10 mg via INTRAVENOUS

## 2020-03-11 MED ORDER — PROPOFOL 10 MG/ML IV BOLUS
INTRAVENOUS | Status: DC | PRN
  Administered 2020-03-11: 150 mg via INTRAVENOUS

## 2020-03-11 MED ORDER — ONDANSETRON HCL 4 MG PO TABS: 4.0000 mg | ORAL_TABLET | Freq: Four times a day (QID) | ORAL | Status: DC | PRN

## 2020-03-11 MED ORDER — FINASTERIDE 5 MG PO TABS
5.0000 mg | ORAL_TABLET | Freq: Every morning | ORAL | Status: DC
  Administered 2020-03-12 – 2020-03-13 (×2): 5 mg via ORAL
  Filled 2020-03-11 (×2): qty 1

## 2020-03-11 MED ORDER — ONDANSETRON HCL 4 MG/2ML IJ SOLN: 4.0000 mg | Freq: Once | INTRAMUSCULAR | Status: DC | PRN

## 2020-03-11 MED ORDER — LIDOCAINE 2% (20 MG/ML) 5 ML SYRINGE
INTRAMUSCULAR | Status: AC
  Filled 2020-03-11: qty 5

## 2020-03-11 MED ORDER — ORAL CARE MOUTH RINSE: 15.0000 mL | Freq: Once | OROMUCOSAL | Status: AC

## 2020-03-11 MED ORDER — STERILE WATER FOR INJECTION IJ SOLN
INTRAMUSCULAR | Status: DC | PRN
  Administered 2020-03-11: 15 mL via URETERAL

## 2020-03-11 MED ORDER — KETAMINE HCL 10 MG/ML IJ SOLN
INTRAMUSCULAR | Status: AC
  Filled 2020-03-11: qty 1

## 2020-03-11 MED ORDER — ROCURONIUM BROMIDE 10 MG/ML (PF) SYRINGE
PREFILLED_SYRINGE | INTRAVENOUS | Status: DC | PRN
  Administered 2020-03-11: 10 mg via INTRAVENOUS
  Administered 2020-03-11: 20 mg via INTRAVENOUS
  Administered 2020-03-11: 70 mg via INTRAVENOUS
  Administered 2020-03-11: 10 mg via INTRAVENOUS

## 2020-03-11 MED ORDER — DEXAMETHASONE SODIUM PHOSPHATE 10 MG/ML IJ SOLN
INTRAMUSCULAR | Status: AC
  Filled 2020-03-11: qty 1

## 2020-03-11 MED ORDER — ALVIMOPAN 12 MG PO CAPS
12.0000 mg | ORAL_CAPSULE | ORAL | Status: AC
  Administered 2020-03-11: 12 mg via ORAL
  Filled 2020-03-11: qty 1

## 2020-03-11 MED ORDER — DIPHENHYDRAMINE HCL 50 MG/ML IJ SOLN: 12.5000 mg | Freq: Four times a day (QID) | INTRAMUSCULAR | Status: DC | PRN

## 2020-03-11 MED ORDER — PROPOFOL 10 MG/ML IV BOLUS
INTRAVENOUS | Status: AC
  Filled 2020-03-11: qty 20

## 2020-03-11 MED ORDER — SUGAMMADEX SODIUM 200 MG/2ML IV SOLN
INTRAVENOUS | Status: DC | PRN
  Administered 2020-03-11: 200 mg via INTRAVENOUS

## 2020-03-11 MED ORDER — MORPHINE SULFATE (PF) 4 MG/ML IV SOLN: 4.0000 mg | INTRAVENOUS | Status: DC | PRN

## 2020-03-11 SURGICAL SUPPLY — 102 items
ADH SKN CLS APL DERMABOND .7 (GAUZE/BANDAGES/DRESSINGS) ×2
BAG URO CATCHER STRL LF (MISCELLANEOUS) ×3 IMPLANT
BLADE EXTENDED COATED 6.5IN (ELECTRODE) IMPLANT
CANNULA REDUC XI 12-8 STAPL (CANNULA) ×3
CANNULA REDUCER 12-8 DVNC XI (CANNULA) ×2 IMPLANT
CATH URET 5FR 28IN OPEN ENDED (CATHETERS) ×6 IMPLANT
CELLS DAT CNTRL 66122 CELL SVR (MISCELLANEOUS) IMPLANT
CLOTH BEACON ORANGE TIMEOUT ST (SAFETY) ×3 IMPLANT
COVER SURGICAL LIGHT HANDLE (MISCELLANEOUS) ×6 IMPLANT
COVER TIP SHEARS 8 DVNC (MISCELLANEOUS) ×2 IMPLANT
COVER TIP SHEARS 8MM DA VINCI (MISCELLANEOUS) ×3
COVER WAND RF STERILE (DRAPES) ×3 IMPLANT
DECANTER SPIKE VIAL GLASS SM (MISCELLANEOUS) IMPLANT
DERMABOND ADVANCED (GAUZE/BANDAGES/DRESSINGS) ×1
DERMABOND ADVANCED .7 DNX12 (GAUZE/BANDAGES/DRESSINGS) ×2 IMPLANT
DRAIN CHANNEL 19F RND (DRAIN) IMPLANT
DRAPE ARM DVNC X/XI (DISPOSABLE) ×8 IMPLANT
DRAPE COLUMN DVNC XI (DISPOSABLE) ×2 IMPLANT
DRAPE DA VINCI XI ARM (DISPOSABLE) ×12
DRAPE DA VINCI XI COLUMN (DISPOSABLE) ×3
DRAPE SURG IRRIG POUCH 19X23 (DRAPES) ×3 IMPLANT
DRSG OPSITE POSTOP 4X10 (GAUZE/BANDAGES/DRESSINGS) IMPLANT
DRSG OPSITE POSTOP 4X6 (GAUZE/BANDAGES/DRESSINGS) IMPLANT
DRSG OPSITE POSTOP 4X8 (GAUZE/BANDAGES/DRESSINGS) IMPLANT
DRSG TELFA 3X8 NADH (GAUZE/BANDAGES/DRESSINGS) ×3 IMPLANT
ELECT PENCIL ROCKER SW 15FT (MISCELLANEOUS) ×3 IMPLANT
ELECT REM PT RETURN 15FT ADLT (MISCELLANEOUS) ×3 IMPLANT
ENDOLOOP SUT PDS II  0 18 (SUTURE)
ENDOLOOP SUT PDS II 0 18 (SUTURE) IMPLANT
EVACUATOR SILICONE 100CC (DRAIN) IMPLANT
GAUZE SPONGE 4X4 12PLY STRL (GAUZE/BANDAGES/DRESSINGS) ×3 IMPLANT
GLOVE BIO SURGEON STRL SZ 6.5 (GLOVE) ×15 IMPLANT
GLOVE BIO SURGEON STRL SZ7.5 (GLOVE) ×6 IMPLANT
GLOVE BIOGEL PI IND STRL 7.0 (GLOVE) ×10 IMPLANT
GLOVE BIOGEL PI INDICATOR 7.0 (GLOVE) ×5
GLOVE ECLIPSE 6.5 STRL STRAW (GLOVE) ×6 IMPLANT
GLOVE INDICATOR 8.0 STRL GRN (GLOVE) ×6 IMPLANT
GLOVE SURG SS PI 7.0 STRL IVOR (GLOVE) ×3 IMPLANT
GLOVE SURG SS PI 8.0 STRL IVOR (GLOVE) ×3 IMPLANT
GOWN STRL REUS W/TWL LRG LVL3 (GOWN DISPOSABLE) ×6 IMPLANT
GOWN STRL REUS W/TWL XL LVL3 (GOWN DISPOSABLE) ×21 IMPLANT
GRASPER SUT TROCAR 14GX15 (MISCELLANEOUS) IMPLANT
GUIDEWIRE STR DUAL SENSOR (WIRE) ×3 IMPLANT
HOLDER FOLEY CATH W/STRAP (MISCELLANEOUS) ×3 IMPLANT
IRRIG SUCT STRYKERFLOW 2 WTIP (MISCELLANEOUS) ×3
IRRIGATION SUCT STRKRFLW 2 WTP (MISCELLANEOUS) ×2 IMPLANT
KIT PROCEDURE DA VINCI SI (MISCELLANEOUS) ×3
KIT PROCEDURE DVNC SI (MISCELLANEOUS) ×2 IMPLANT
KIT TURNOVER KIT A (KITS) IMPLANT
MANIFOLD NEPTUNE II (INSTRUMENTS) ×3 IMPLANT
NEEDLE INSUFFLATION 14GA 120MM (NEEDLE) ×3 IMPLANT
PACK CARDIOVASCULAR III (CUSTOM PROCEDURE TRAY) ×3 IMPLANT
PACK COLON (CUSTOM PROCEDURE TRAY) ×3 IMPLANT
PACK CYSTO (CUSTOM PROCEDURE TRAY) ×3 IMPLANT
PAD POSITIONING PINK XL (MISCELLANEOUS) ×3 IMPLANT
PORT LAP GEL ALEXIS MED 5-9CM (MISCELLANEOUS) ×3 IMPLANT
RELOAD STAPLER 4.3X60 GRN DVNC (STAPLE) ×2 IMPLANT
RTRCTR WOUND ALEXIS 18CM MED (MISCELLANEOUS)
SCISSORS LAP 5X35 DISP (ENDOMECHANICALS) ×3 IMPLANT
SEAL CANN UNIV 5-8 DVNC XI (MISCELLANEOUS) ×8 IMPLANT
SEAL XI 5MM-8MM UNIVERSAL (MISCELLANEOUS) ×12
SEALER VESSEL DA VINCI XI (MISCELLANEOUS) ×3
SEALER VESSEL EXT DVNC XI (MISCELLANEOUS) ×2 IMPLANT
SOLUTION ELECTROLUBE (MISCELLANEOUS) ×3 IMPLANT
SPONGE LAP 18X18 RF (DISPOSABLE) ×3 IMPLANT
STAPLER 45 DA VINCI SURE FORM (STAPLE)
STAPLER 45 SUREFORM DVNC (STAPLE) IMPLANT
STAPLER 60 DA VINCI SURE FORM (STAPLE) ×3
STAPLER 60 SUREFORM DVNC (STAPLE) ×2 IMPLANT
STAPLER CANNULA SEAL DVNC XI (STAPLE) IMPLANT
STAPLER CANNULA SEAL XI (STAPLE)
STAPLER ECHELON POWER CIR 29 (STAPLE) ×3 IMPLANT
STAPLER RELOAD 4.3X60 GREEN (STAPLE) ×3
STAPLER RELOAD 4.3X60 GRN DVNC (STAPLE) ×2
STOPCOCK 4 WAY LG BORE MALE ST (IV SETS) IMPLANT
SUT ETHILON 2 0 PS N (SUTURE) IMPLANT
SUT NOVA NAB DX-16 0-1 5-0 T12 (SUTURE) ×6 IMPLANT
SUT PROLENE 2 0 KS (SUTURE) ×3 IMPLANT
SUT SILK 2 0 (SUTURE) ×3
SUT SILK 2 0 SH CR/8 (SUTURE) IMPLANT
SUT SILK 2-0 18XBRD TIE 12 (SUTURE) ×2 IMPLANT
SUT SILK 3 0 (SUTURE)
SUT SILK 3 0 SH CR/8 (SUTURE) ×3 IMPLANT
SUT SILK 3-0 18XBRD TIE 12 (SUTURE) IMPLANT
SUT V-LOC BARB 180 2/0GR6 GS22 (SUTURE)
SUT VIC AB 2-0 SH 18 (SUTURE) IMPLANT
SUT VIC AB 2-0 SH 27 (SUTURE)
SUT VIC AB 2-0 SH 27X BRD (SUTURE) IMPLANT
SUT VIC AB 3-0 SH 18 (SUTURE) IMPLANT
SUT VIC AB 4-0 PS2 27 (SUTURE) ×6 IMPLANT
SUT VICRYL 0 UR6 27IN ABS (SUTURE) ×3 IMPLANT
SUTURE V-LC BRB 180 2/0GR6GS22 (SUTURE) IMPLANT
SYR 10ML ECCENTRIC (SYRINGE) ×3 IMPLANT
SYS LAPSCP GELPORT 120MM (MISCELLANEOUS)
SYSTEM LAPSCP GELPORT 120MM (MISCELLANEOUS) IMPLANT
TOWEL OR 17X26 10 PK STRL BLUE (TOWEL DISPOSABLE) IMPLANT
TOWEL OR NON WOVEN STRL DISP B (DISPOSABLE) IMPLANT
TRAY FOLEY MTR SLVR 16FR STAT (SET/KITS/TRAYS/PACK) ×3 IMPLANT
TROCAR ADV FIXATION 5X100MM (TROCAR) ×3 IMPLANT
TUBING CONNECTING 10 (TUBING) ×9 IMPLANT
TUBING INSUFFLATION 10FT LAP (TUBING) ×3 IMPLANT
TUBING UROLOGY SET (TUBING) IMPLANT

## 2020-03-11 NOTE — H&P (Signed)
I have reviewed the cystoscopy and firefly procedure with Wesley [name] and discussed the added risks of bleeding, infection and injury to the urinary track.  He is agreeable to proceed.

## 2020-03-11 NOTE — Evaluation (Signed)
Physical Therapy Evaluation Patient Details Name: Wesley [name] MRN: [id number] DOB: October 25, 1940 Today's Date: 03/11/2020   History of Present Illness  The patient is a 79 year old male with PMH significant for CAD, HTN, HLD, OSA, BPH, GERD. 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  He has slowly recovered since then. Patient has returned for colostomy reversal and is no s/p colostomy reversal, proctoscopy, and cystoscopy with bil firefly injection on 03/11/20.     Clinical Impression  Wesley [name] is 79 y.o. male admitted with above HPI and diagnosis. Patient is currently limited by functional impairments below (see PT problem list). Patient lives with his wife and is independent at baseline and enjoys fishing, and tennis and walking for exercise. He is currently limited by increased balance impairments with unsteady gait and will benefit from continued skilled PT interventions to address impairments and progress independence with mobility, recommending OPPT follow up for balance training. Acute PT will follow and progress as able.     Follow Up Recommendations Outpatient PT    Equipment Recommendations  None recommended by PT    Recommendations for Other Services       Precautions / Restrictions Precautions Precautions: Fall Restrictions Weight Bearing Restrictions: No      Mobility  Bed Mobility        General bed mobility comments: pt sitting EOB with tray when therapist arrived  Transfers Overall transfer level: Needs assistance Equipment used: Rolling walker (2 wheeled) Transfers: Sit to/from Stand Sit to Stand: Min guard         General transfer comment: pt steady rising from EOB to RW, no assist required for power up  Ambulation/Gait Ambulation/Gait assistance: Min assist Gait Distance (Feet): 300 Feet Assistive device: Rolling walker (2  wheeled);None;IV Pole Gait Pattern/deviations: Step-through pattern;Decreased stride length;Drifts right/left;Wide base of support Gait velocity: decreased   General Gait Details: Initiated gait with RW, pt steady with step length/width WFL's. cues to improve gait quality with knee flexion during swing phase. Pt attempted gait with no support and required wider BOS to maintain balance and unsteady throughout. pt with slight improvedment using IV pole for continued to stagger Rt/Lt and required min assist to prevent LOB 1x.   Stairs            Wheelchair Mobility    Modified Rankin (Stroke Patients Only)       Balance Overall balance assessment: Needs assistance Sitting-balance support: Feet supported Sitting balance-Leahy Scale: Good     Standing balance support: Bilateral upper extremity supported;Single extremity supported;No upper extremity supported;During functional activity Standing balance-Leahy Scale: Good Standing balance comment: dynamic balance musch improved by Bil UE support Single Leg Stance - Right Leg: 0 Single Leg Stance - Left Leg: 0 Tandem Stance - Right Leg: 0 Tandem Stance - Left Leg: 0 Rhomberg - Eyes Opened: 10 Rhomberg - Eyes Closed: 6                 Pertinent Vitals/Pain Pain Assessment: 0-10 Pain Score: 3  Pain Location: abdomen Pain Descriptors / Indicators: Discomfort Pain Intervention(s): Limited activity within patient's tolerance;Monitored during session;Repositioned    Home Living Family/patient expects to be discharged to:: Private residence Living Arrangements: Spouse/significant other Available Help at Discharge: Family;Available 24 hours/day;Other (Comment) Type of Home: House Home Access: Stairs to enter Entrance Stairs-Rails: Right Entrance Stairs-Number of Steps: 3 Home Layout: One level Home Equipment:  Walker - 2 wheels;Shower seat Additional Comments: sink next to toilet    Prior Function Level of Independence:  Independent         Comments: pt used RW after surgery in march and now occasionally cane due to balance impairments. He reports feeling unsteady with gait.     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)    Cervical / Trunk Assessment Cervical / Trunk Assessment: Normal  Communication   Communication: No difficulties  Cognition Arousal/Alertness: Awake/alert Behavior During Therapy: WFL for tasks assessed/performed Overall Cognitive Status: Within Functional Limits for tasks assessed               General Comments      Exercises     Assessment/Plan    PT Assessment Patient needs continued PT services  PT Problem List Decreased strength;Decreased activity tolerance;Decreased balance;Decreased mobility;Decreased coordination;Decreased knowledge of use of DME       PT Treatment Interventions DME instruction;Gait training;Stair training;Functional mobility training;Therapeutic activities;Therapeutic exercise;Balance training;Patient/family education    PT Goals (Current goals can be found in the Care Plan section)  Acute Rehab PT Goals Patient Stated Goal: improve balance and reduce risk of falls to be able to fish again and play tennis with his granddaughter PT Goal Formulation: With patient/family Time For Goal Achievement: 03/25/20 Potential to Achieve Goals: Good    Frequency Min 3X/week    AM-PAC PT "6 Clicks" Mobility  Outcome Measure Help needed turning from your back to your side while in a flat bed without using bedrails?: None Help needed moving from lying on your back to sitting on the side of a flat bed without using bedrails?: A Little Help needed moving to and from a bed to a chair (including a wheelchair)?:  A Little Help needed standing up from a chair using your arms (e.g., wheelchair or bedside chair)?: A Little Help needed to walk in hospital room?: A Little Help needed climbing 3-5 steps with a railing? : A Little 6 Click Score: 19    End of Session Equipment Utilized During Treatment: Gait belt Activity Tolerance: Patient tolerated treatment well Patient left: in chair;with call bell/phone within reach;with chair alarm set;with family/visitor present Nurse Communication: Mobility status;Other (comment) (RN notified of drainage from abdominal incision.) PT Visit Diagnosis: Unsteadiness on feet (R26.81);Other abnormalities of gait and mobility (R26.89);Difficulty in walking, not elsewhere classified (R26.2)    Time: 1459-1530 PT Time Calculation (min) (ACUTE ONLY): 31 min   Charges:   PT Evaluation $PT Eval Low Complexity: 1 Low PT Treatments $Gait Training: 8-22 mins        Verner Mould, DPT Acute Rehabilitation Services  Office [phone] Pager [phone]  03/11/2020 5:25 PM

## 2020-03-11 NOTE — Op Note (Signed)
03/11/2020  10:59 AM  PATIENT:  Wesley [name]  79 y.o. male  Patient Care Team: Deland Pretty, MD as PCP - General (Internal Medicine) Jerline Pain, MD as PCP - Cardiology (Cardiology)  PRE-OPERATIVE DIAGNOSIS:  colostomy present DIVERTICULAR DISEASE  POST-OPERATIVE DIAGNOSIS:  DIVERTICULAR DISEASE  PROCEDURE:   ROBOTIC ASSISTED COLOSTOMY REVERSAL, RIGID PROCTOSCOPY CYSTOSCOPY WITH BILATERAL FIREFLY INJECTION   Surgeon(s): Irine Seal, MD Leighton Ruff, MD Greer Pickerel, MD  ASSISTANT: Dr Redmond Pulling   ANESTHESIA:   local and general  EBL:148ml  Total I/O In: 1000 [I.V.:1000] Out: 750 [Urine:650; Blood:100]  Delay start of Pharmacological VTE agent (>24hrs) due to surgical blood loss or risk of bleeding:  no  DRAINS: none   SPECIMEN:  Source of Specimen:  Remnant sigmoid  DISPOSITION OF SPECIMEN:  PATHOLOGY  COUNTS:  YES  PLAN OF CARE: Admit to inpatient   PATIENT DISPOSITION:  PACU - hemodynamically stable.  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  He is now healed from the surgery and is ready for his GI system to be in continuity. I recommended colostomy reversal:  The anatomy & physiology of the digestive tract was discussed.  The pathophysiology was discussed.  Natural history risks without surgery was discussed.   I worked to give an overview of the disease and the frequent need to have multispecialty involvement.  I feel the risks of no intervention will lead to serious problems that outweigh the operative risks; therefore, I recommended a partial colectomy to remove the pathology.  Laparoscopic & open techniques were discussed.   Risks such as bleeding, infection, abscess, leak, reoperation, possible ostomy, hernia, heart attack, death, and other risks were discussed.  I noted a good likelihood this will help address the problem.   Goals of post-operative recovery were discussed as well.    The patient expressed  understanding & wished to proceed with surgery.  OR FINDINGS: Significant pelvic adhesions  DESCRIPTION:   Informed consent was confirmed.  The patient underwent general anaesthesia without difficulty.  The patient was positioned appropriately.  VTE prevention in place.  The patient's abdomen was clipped, prepped, & draped in a sterile fashion.  Surgical timeout confirmed our plan.  The patient was positioned in reverse Trendelenburg.  Abdominal entry was gained using a varies needle in the left upper quadrant.  Entry was clean.  I induced carbon dioxide insufflation.  An 62mm robotic port was placed in the right upper quadrant.  Camera inspection revealed no injury.  Extra ports were carefully placed under direct laparoscopic visualization.  I laparoscopically reflected the greater omentum and the upper abdomen the small bowel in the upper abdomen. The patient was appropriately positioned and the robot was docked to the patient's left side.  Instruments were placed under direct visualization.   I spent the first hour of the surgery lysing adhesions from the small bowel to the colostomy, abdominal wall and pelvis.  There were 2 loops of small bowel that were adherent into the pelvis draping over the left side.  These were carefully taken down using blunt dissection and sharp robotic scissors.  No injuries were noted upon visual inspection of the adhesiolysis.  Once we were able to get the small bowel out of the pelvis this was then placed in the right side of the abdomen and the colostomy was evaluated.  The omental adhesions were taken down.  Adhesions to the abdominal wall were taken down using blunt dissection.  The  lateral sidewall was divided using the robotic vessel sealer.  I then turned my attention to the rectum.  There was a remnant portion of sigmoid colon.  This was dissected free from the pelvic floor using blunt dissection.  I could see the right and left ureters and stayed away from these  structures.  They remained intact throughout the dissection.  I used the robotic vessel sealer to divide the mesentery to the level of the rectosigmoid junction.  Once this was completed, the robot was undocked and the colostomy was taken down using electrocautery.  This was divided over a pursestring device using electrocautery.  There was good perfusion of the bowel wall and pulsatile flow into the marginal artery.  A 29 mm EEA stapler anvil was placed into the colon and the pursestring was tied tightly around this.  This was then placed back into the abdomen.  The cap was placed on the [place] wound protector and a 12 mm robotic port was placed into this.  The robot was then redocked to the patient's abdomen from the left side.  A green load robotic stapler was used to divide the rectosigmoid junction.  This was removed through the colostomy site at a later time.  The EEA stapler was then inserted into the rectum and brought out through the distal rectal stump.  The anastomosis was created under direct robotic visualization.  There was no tension on the anastomosis.  There was no leak when tested with insufflation under irrigation.  Hemostasis was good at the end of the procedure.  The abdomen was irrigated with approximately 2 L of warm normal saline.  The entire abdominal contents were inspected visually.  There was no sign of injury or bleeding.  The instruments were then removed and the robot was undocked.  The [place] wound protector was then removed and we switched to clean gowns, gloves, instruments and drapes.  I inspected the small bowel adhesiolysis through the ostomy site.  There was no sign of injury or bleeding.  The fascia was then closed using interrupted #1 Novafil sutures.  The subcutaneous tissue was reapproximated with a 0 Vicryl pursestring suture.  A Telfa wick was placed in the middle and a sterile dressing was applied over the colostomy site.  The port site incisions were closed using 4-0  Vicryl sutures and Dermabond.  Patient was then awakened from anesthesia and sent to the postanesthesia care unit in stable condition.  All counts were correct per operating room staff.   An MD assistant was necessary for tissue manipulation, retraction and positioning due to the complexity of the case and hospital policies

## 2020-03-11 NOTE — Transfer of Care (Signed)
Immediate Anesthesia Transfer of Care Note  Patient: Wesley [name]  Procedure(s) Performed: ROBOTIC ASSISTED COLOSTOMY REVERSAL, RIGID PROCTOSCOPY (N/A Abdomen) CYSTOSCOPY WITH BILATERAL FIREFLY INJECTION (Bilateral )  Patient Location: PACU  Anesthesia Type:General  Level of Consciousness: drowsy  Airway & Oxygen Therapy: Patient Spontanous Breathing and Patient connected to face mask oxygen  Post-op Assessment: Report given to RN and Post -op Vital signs reviewed and stable  Post vital signs: Reviewed and stable  Last Vitals:  Vitals Value Taken Time  BP 139/99 03/11/20 1106  Temp    Pulse 99 03/11/20 1107  Resp 11 03/11/20 1107  SpO2 99 % 03/11/20 1107  Vitals shown include unvalidated device data.  Last Pain:  Vitals:   03/11/20 0600  TempSrc: Oral      Patients Stated Pain Goal: 5 (09/62/83 6629)  Complications: No complications documented.

## 2020-03-11 NOTE — H&P (Signed)
The patient is a 79 year old male who presents with diverticulitis. 79 year old male who presents to the office for evaluation for colostomy reversal. 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 He has slowly recovered since then. He is tolerating a diet and having good bowel function through his ostomy. He underwent a colonoscopy in June. He did have a proximal sigmoid stricture and it appears that a small amount of sigmoid colon remains in reviewing his operative note. I also reviewed his preoperative CT scan in preparation for surgery.   Problem List/Past Medical  S/P UMBILICAL HERNIA REPAIR, FOLLOW-UP EXAM (X91) UMBILICAL HERNIA WITHOUT OBSTRUCTION OR GANGRENE (K42.9) ENCOUNTER FOR REMOVAL OF STAPLES (Z48.02) POST-OPERATIVE STATE (Z98.890) PARTIAL OBSTRUCTION OF COLON (K56.600) DIVERTICULAR DISEASE (K57.90)  Past Surgical History Leighton Ruff, MD; 11/18/8293 4:05 PM) Colon Removal - Partial Cataract Surgery Bilateral. Shoulder Surgery Right. Colon Polyp Removal - Colonoscopy  Diagnostic Studies History Leighton Ruff, MD; 01/14/1307 4:05 PM) Colonoscopy within last year 5-10 years ago  Allergies ([name], [place]; 01/14/2020 4:00 PM) Demerol *ANALGESICS - OPIOID* Phenergan *ANTIHISTAMINES* Promethazine HCl *ANTIHISTAMINES* Allergies Reconciled  Medication History Leighton Ruff, MD; 01/16/7845 4:30 PM) Claritin (10MG  Capsule, Oral as needed) Active. Finasteride (5MG  Tablet, Oral) Active. Flonase (50MCG/ACT Suspension, Nasal as needed) Active. Lipitor (80MG  Tablet, Oral) Active. Lisinopril (10MG  Tablet, Oral) Active. Vitamin D (50000UNIT Capsule, Oral) Active. Omega 3 (1200MG  Capsule, Oral) Active. Ondansetron HCl (4MG  Tablet, Oral) Active.   Social History Leighton Ruff, MD; 04/20/2951 4:05 PM) Alcohol use Moderate alcohol  use. Tobacco use Never smoker. Caffeine use Coffee. No drug use  Family History Leighton Ruff, MD; 03/18/1323 4:05 PM) Diabetes Mellitus Mother. Cerebrovascular Accident Brother. Heart Disease Brother, Father. Melanoma Brother. Heart disease in male family member before age 68  Other Problems Leighton Ruff, MD; 4/0/1027 4:30 PM) No pertinent past medical history Arthritis Cancer Enlarged Prostate High blood pressure Sleep Apnea Umbilical Hernia Repair    BP (!) 134/91   Pulse 78   Temp 98.6 F (37 C) (Oral)   Resp 17   Ht 5\' 9"  (1.753 m)   Wt 85 kg   SpO2 98%   BMI 27.67 kg/m     Physical Exam   General Mental Status-Alert. General Appearance-Cooperative. CV: RRR Lung: CTA Abdomen Inspection Skin - Scar - Periumbilical. Colostomy - Left Lower Quadrant.    Assessment & Plan   DIVERTICULAR DISEASE (K57.90) Impression: 79 year old male status post resection of a diverticular stricture in March 2021. This was done due to severe obstruction. We discussed typical risk of colostomy reversal, which include damage to adjacent structures, infection, hernia, and anastomotic leak. Given his body habitus, we discussed his hernia risk is probably close to 20%. We discussed the possible need for ureteral stents. I have discussed his surgery with his original surgeon, and it does seem this will be necessary. The surgery and anatomy were described to the patient as well as the risks of surgery and the possible complications. These include: Bleeding, deep abdominal infections and possible wound complications such as hernia and infection, damage to adjacent structures, leak of surgical connections, which can lead to other surgeries and possibly an ostomy, possible need for other procedures, such as abscess drains in radiology, possible prolonged hospital stay, possible diarrhea from removal of part of the colon, possible constipation from narcotics,  possible bowel, bladder or sexual dysfunction if having rectal surgery, prolonged fatigue/weakness or appetite loss,  possible early recurrence of of disease, possible complications of their medical problems such as heart disease or arrhythmias or lung problems, death (less than 1%). I believe the patient understands and wishes to proceed with the surgery.

## 2020-03-11 NOTE — Anesthesia Postprocedure Evaluation (Signed)
Anesthesia Post Note  Patient: Wesley [name]  Procedure(s) Performed: ROBOTIC ASSISTED COLOSTOMY REVERSAL, RIGID PROCTOSCOPY (N/A Abdomen) CYSTOSCOPY WITH BILATERAL FIREFLY INJECTION (Bilateral )     Patient location during evaluation: PACU Anesthesia Type: General Level of consciousness: awake and alert and oriented Pain management: pain level controlled Vital Signs Assessment: post-procedure vital signs reviewed and stable Respiratory status: spontaneous breathing, nonlabored ventilation and respiratory function stable Cardiovascular status: blood pressure returned to baseline and stable Postop Assessment: no apparent nausea or vomiting Anesthetic complications: no   No complications documented.  Last Vitals:  Vitals:   03/11/20 1130 03/11/20 1145  BP: (!) 145/95 (!) 146/100  Pulse: 93 88  Resp: 15 16  Temp:  36.4 C  SpO2: 99% 96%    Last Pain:  Vitals:   03/11/20 1145  TempSrc:   PainSc: 0-No pain                 Sohrab Keelan A.

## 2020-03-11 NOTE — Op Note (Signed)
Procedure: Cystoscopy with bilateral ureteral catheterization and injection of firefly.  Preop diagnosis: History of colonic obstruction now for colostomy takedown.  Postop diagnosis: Same.  Surgeon: Dr. Irine Seal.  Anesthesia: General.  Specimen: None.  EBL: None.  Drains: Foley catheter.  Complications: None.  Indications: Wesley [name] is a 79 year old male with a history of colonic obstruction requiring prior colostomy.  He is now to undergo colostomy takedown and firefly injection was requested by Dr. Marcello Moores for ureteral visualization.  Procedure: He was taken operating room and given antibiotics per Dr. Manon Hilding instructions.  A general anesthetic was induced.  He was placed in lithotomy position and fitted with PAS hose.  His perineum and genitalia were prepped with Betadine solution he was draped in usual sterile fashion.  Cystoscopy was performed using the 23 [place] scope and a 30 degree lens.  Examination revealed a normal urethra.  The external sphincter was intact.  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  Examination of bladder revealed moderate trabeculation.  No tumors, stones or inflammation were noted.  Ureteral orifices were unremarkable.  The right ureteral orifice was cannulated with a 5 French opening catheter but to advance the catheter and use a sensor wire.  Once the catheter was appropriately advanced 7 mL of firefly solution was gently instilled.  Ureteral catheter was removed.  The procedure was then repeated in identical fashion on the left side.  The cystoscope was then removed and a Foley catheter was inserted.  The balloon was filled with 10 cc of sterile fluid and the catheter was placed to straight drainage.  There were no complications during my portion of procedure.

## 2020-03-11 NOTE — Anesthesia Procedure Notes (Signed)
Procedure Name: Intubation Date/Time: 03/11/2020 7:34 AM Performed by: Sharlette Dense, CRNA Patient Re-evaluated:Patient Re-evaluated prior to induction Oxygen Delivery Method: Circle system utilized Preoxygenation: Pre-oxygenation with 100% oxygen Induction Type: IV induction Ventilation: Mask ventilation without difficulty and Oral airway inserted - appropriate to patient size Laryngoscope Size: Miller and 3 Grade View: Grade I Tube type: Oral Tube size: 8.0 mm Number of attempts: 1 Airway Equipment and Method: Stylet Placement Confirmation: ETT inserted through vocal cords under direct vision,  positive ETCO2 and breath sounds checked- equal and bilateral Secured at: 23 cm Tube secured with: Tape Dental Injury: Teeth and Oropharynx as per pre-operative assessment

## 2020-03-12 ENCOUNTER — Encounter (HOSPITAL_COMMUNITY): Payer: Self-pay | Admitting: General Surgery

## 2020-03-12 LAB — CBC
HCT: 39.5 % (ref 39.0–52.0)
Hemoglobin: 13.4 g/dL (ref 13.0–17.0)
MCH: 33.5 pg (ref 26.0–34.0)
MCHC: 33.9 g/dL (ref 30.0–36.0)
MCV: 98.8 fL (ref 80.0–100.0)
Platelets: 193 10*3/uL (ref 150–400)
RBC: 4 MIL/uL — ABNORMAL LOW (ref 4.22–5.81)
RDW: 14.6 % (ref 11.5–15.5)
WBC: 15.5 10*3/uL — ABNORMAL HIGH (ref 4.0–10.5)
nRBC: 0 % (ref 0.0–0.2)

## 2020-03-12 LAB — BASIC METABOLIC PANEL
Anion gap: 10 (ref 5–15)
BUN: 9 mg/dL (ref 8–23)
CO2: 24 mmol/L (ref 22–32)
Calcium: 8.7 mg/dL — ABNORMAL LOW (ref 8.9–10.3)
Chloride: 104 mmol/L (ref 98–111)
Creatinine, Ser: 0.84 mg/dL (ref 0.61–1.24)
GFR calc Af Amer: >60 mL/min (ref >60)
GFR calc non Af Amer: >60 mL/min (ref >60)
Glucose, Bld: 127 mg/dL — ABNORMAL HIGH (ref 70–99)
Potassium: 5.1 mmol/L (ref 3.5–5.1)
Sodium: 138 mmol/L (ref 135–145)

## 2020-03-12 LAB — SURGICAL PATHOLOGY

## 2020-03-12 MED ORDER — IBUPROFEN 400 MG PO TABS
600.0000 mg | ORAL_TABLET | Freq: Four times a day (QID) | ORAL | Status: DC | PRN
  Administered 2020-03-12 – 2020-03-14 (×5): 600 mg via ORAL
  Filled 2020-03-12 (×6): qty 1

## 2020-03-12 MED ORDER — CHLORHEXIDINE GLUCONATE CLOTH 2 % EX PADS
6.0000 | MEDICATED_PAD | Freq: Every day | CUTANEOUS | Status: DC
  Administered 2020-03-12: 6 via TOPICAL

## 2020-03-12 MED ORDER — CALCIUM CARBONATE ANTACID 500 MG PO CHEW: 1.0000 | CHEWABLE_TABLET | Freq: Two times a day (BID) | ORAL | Status: DC | PRN

## 2020-03-12 MED ORDER — TRAMADOL HCL 50 MG PO TABS
50.0000 mg | ORAL_TABLET | Freq: Four times a day (QID) | ORAL | Status: DC | PRN
  Administered 2020-03-12 – 2020-03-14 (×4): 50 mg via ORAL
  Filled 2020-03-12 (×4): qty 1

## 2020-03-12 NOTE — TOC Initial Note (Signed)
Transition of Care [facility]) - Initial/Assessment Note    Patient Details  Name: Wesley [name] MRN: [id number] Date of Birth: [birth year]  Transition [facility]) CM/SW Contact:    Lennart Pall, LCSW Phone Number: 03/12/2020, 10:57 AM  Clinical Narrative:    Met with pt this morning per PT recommendation for OPPT follow up.  Pt aware of recommendation and confirms he would like to have this arranged to address continue issues with neuropathy and gait stability.  Have placed referral to [facility] and this facility will contact pt to arrange appointments.  No further TOC needs at this time.             Expected Discharge Plan: OP Rehab Barriers to Discharge: Continued Medical Work up   Patient Goals and CMS Choice Patient states their goals for this hospitalization and ongoing recovery are:: go home CMS Medicare.gov Compare Post Acute Care list provided to:: Patient Choice offered to / list presented to : Patient  Expected Discharge Plan and Services Expected Discharge Plan: OP Rehab In-house Referral: Clinical Social Work   Post Acute Care Choice:  (OPPT) Living arrangements for the past 2 months: Single Family Home                 DME Arranged: N/A DME Agency: NA       HH Arranged: NA [place] Agency: NA        Prior Living Arrangements/Services Living arrangements for the past 2 months: Single Family Home Lives with:: Spouse Patient language and need for interpreter reviewed:: Yes        Need for Family Participation in Patient Care: No (Comment) Care giver support system in place?: Yes (comment)   Criminal Activity/Legal Involvement Pertinent to Current Situation/Hospitalization: No - Comment as needed  Activities of Daily Living Home Assistive Devices/Equipment: None ADL Screening (condition at time of admission) Patient's cognitive ability adequate to safely complete daily activities?: Yes Is the patient deaf or have difficulty hearing?: No Does the  patient have difficulty seeing, even when wearing glasses/contacts?: No Does the patient have difficulty concentrating, remembering, or making decisions?: No Patient able to express need for assistance with ADLs?: Yes Does the patient have difficulty dressing or bathing?: No Independently performs ADLs?: Yes (appropriate for developmental age) Does the patient have difficulty walking or climbing stairs?: No Weakness of Legs: None Weakness of Arms/Hands: None  Permission Sought/Granted   Permission granted to share information with : Yes, Verbal Permission Granted     Permission granted to share info w AGENCY: OP Rehab        Emotional Assessment Appearance:: Appears stated age Attitude/Demeanor/Rapport: Gracious Affect (typically observed): Accepting, Pleasant Orientation: : Oriented to Self, Oriented to Place, Oriented to  Time, Oriented to Situation Alcohol / Substance Use: Not Applicable Psych Involvement: No (comment)  Admission diagnosis:  Colostomy status [facility]) [Z93.3] Patient Active Problem List   Diagnosis Date Noted  . 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 Partial obstruction of colon ([place])   . Generalized abdominal pain   . Abdominal bloating   . 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 Colonic mass 10/28/2019  . Hypokalemia 10/28/2019  . 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 Non-traumatic rotator cuff tear 09/21/2017  . Syncope and collapse 08/12/2013  . Back pain 08/12/2013  . CAD (coronary artery disease) 08/12/2013  . High blood pressure 03/15/2011  . GERD (gastroesophageal reflux disease) 03/15/2011  . Paronychia of great toe of left foot 03/15/2011  PCP:  Deland Pretty, MD Pharmacy:   [place], [place] [address] Phone: [phone] Fax: [place] [facility] [place], [place] [address] Phone: [phone] Fax: [phone]     Social Determinants of Health (SDOH) Interventions    Readmission Risk Interventions Readmission Risk Prevention Plan 03/12/2020  Post Dischage Appt Complete  Medication Screening Complete  Transportation Screening Complete  Some recent data might be hidden

## 2020-03-12 NOTE — Progress Notes (Signed)
1 Day Post-Op Robotic colostomy reversal Subjective: Pt doing well.  Passing flatus.  Some min gerd  Objective: Vital signs in last 24 hours: Temp:  [97.5 F (36.4 C)-97.7 F (36.5 C)] 97.5 F (36.4 C) (07/30 0743) Pulse Rate:  [69-101] 69 (07/30 0557) Resp:  [8-19] 19 (07/30 0557) BP: (108-152)/(69-103) 119/74 (07/30 0557) SpO2:  [92 %-99 %] 98 % (07/30 0557) Weight:  [88.9 kg] 88.9 kg (07/30 0600)   Intake/Output from previous day: 07/29 0701 - 07/30 0700 In: 3619.9 [P.O.:1260; I.V.:2259.9; IV Piggyback:100] Out: 9381 [Urine:3275; Blood:100] Intake/Output this shift: No intake/output data recorded.   General appearance: alert and cooperative GI: normal findings: soft, non-tender  Incision: no significant drainage  Lab Results:  Recent Labs    03/12/20 0511  WBC 15.5*  HGB 13.4  HCT 39.5  PLT 193   BMET Recent Labs    03/12/20 0511  NA 138  K 5.1  CL 104  CO2 24  GLUCOSE 127*  BUN 9  CREATININE 0.84  CALCIUM 8.7*   PT/INR No results for input(s): LABPROT, INR in the last 72 hours. ABG No results for input(s): PHART, HCO3 in the last 72 hours.  Invalid input(s): PCO2, PO2  MEDS, Scheduled . acetaminophen  1,000 mg Oral Q6H  . alvimopan  12 mg Oral BID  . Chlorhexidine Gluconate Cloth  6 each Topical Daily  . enoxaparin (LOVENOX) injection  40 mg Subcutaneous Q24H  . feeding supplement  237 mL Oral BID BM  . finasteride  5 mg Oral q morning - 10a  . gabapentin  300 mg Oral BID  . lisinopril  10 mg Oral q morning - 10a  . loratadine  10 mg Oral Daily  . saccharomyces boulardii  250 mg Oral BID    Studies/Results: No results found.  Assessment: s/p Procedure(s): ROBOTIC ASSISTED COLOSTOMY REVERSAL, RIGID PROCTOSCOPY CYSTOSCOPY WITH BILATERAL FIREFLY INJECTION Patient Active Problem List   Diagnosis Date Noted  . 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 Partial obstruction of colon ([place])   . Generalized abdominal pain   . Abdominal bloating    . 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 Colonic mass 10/28/2019  . Hypokalemia 10/28/2019  . 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 Non-traumatic rotator cuff tear 09/21/2017  . Syncope and collapse 08/12/2013  . Back pain 08/12/2013  . CAD (coronary artery disease) 08/12/2013  . High blood pressure 03/15/2011  . GERD (gastroesophageal reflux disease) 03/15/2011  . Paronychia of great toe of left foot 03/15/2011    Expected post op course  Plan: Advance diet to soft foods SL IVF's Recheck labs in am Ambulate TOC consult for HH PT   LOS: 1 day     .Rosario Adie, [facility], [place]    03/12/2020 8:00 AM

## 2020-03-13 LAB — CBC
HCT: 40.8 % (ref 39.0–52.0)
Hemoglobin: 13.6 g/dL (ref 13.0–17.0)
MCH: 33.5 pg (ref 26.0–34.0)
MCHC: 33.3 g/dL (ref 30.0–36.0)
MCV: 100.5 fL — ABNORMAL HIGH (ref 80.0–100.0)
Platelets: 179 10*3/uL (ref 150–400)
RBC: 4.06 MIL/uL — ABNORMAL LOW (ref 4.22–5.81)
RDW: 14.6 % (ref 11.5–15.5)
WBC: 11.6 10*3/uL — ABNORMAL HIGH (ref 4.0–10.5)
nRBC: 0 % (ref 0.0–0.2)

## 2020-03-13 LAB — BASIC METABOLIC PANEL
Anion gap: 6 (ref 5–15)
BUN: 14 mg/dL (ref 8–23)
CO2: 25 mmol/L (ref 22–32)
Calcium: 8.7 mg/dL — ABNORMAL LOW (ref 8.9–10.3)
Chloride: 105 mmol/L (ref 98–111)
Creatinine, Ser: 0.86 mg/dL (ref 0.61–1.24)
GFR calc Af Amer: >60 mL/min (ref >60)
GFR calc non Af Amer: >60 mL/min (ref >60)
Glucose, Bld: 96 mg/dL (ref 70–99)
Potassium: 4.2 mmol/L (ref 3.5–5.1)
Sodium: 136 mmol/L (ref 135–145)

## 2020-03-13 NOTE — Progress Notes (Signed)
2 Days Post-Op   Subjective/Chief Complaint: Pt doing well tol PO well and ambulating well   Objective: Vital signs in last 24 hours: Temp:  [97.4 F (36.3 C)-98.4 F (36.9 C)] 98.4 F (36.9 C) (07/31 0641) Pulse Rate:  [81-103] 81 (07/31 0641) Resp:  [16-20] 18 (07/31 0641) BP: (123-147)/(73-100) 135/88 (07/31 0641) SpO2:  [95 %-100 %] 98 % (07/31 0641) Weight:  [89.2 kg] 89.2 kg (07/31 0600) Last BM Date: 03/12/20  Intake/Output from previous day: 07/30 0701 - 07/31 0700 In: 510 [P.O.:510] Out: 2701 [Urine:2700; Stool:1] Intake/Output this shift: No intake/output data recorded.  General appearance: alert and cooperative GI: soft, non-tender; bowel sounds normal; no masses,  no organomegaly and inc c/d/i  Lab Results:  Recent Labs    03/12/20 0511 03/13/20 0543  WBC 15.5* 11.6*  HGB 13.4 13.6  HCT 39.5 40.8  PLT 193 179   BMET Recent Labs    03/12/20 0511 03/13/20 0543  NA 138 136  K 5.1 4.2  CL 104 105  CO2 24 25  GLUCOSE 127* 96  BUN 9 14  CREATININE 0.84 0.86  CALCIUM 8.7* 8.7*   PT/INR No results for input(s): LABPROT, INR in the last 72 hours. ABG No results for input(s): PHART, HCO3 in the last 72 hours.  Invalid input(s): PCO2, PO2  Studies/Results: No results found.  Anti-infectives: Anti-infectives (From admission, onward)   Start     Dose/Rate Route Frequency Ordered Stop   03/11/20 2000  cefoTEtan (CEFOTAN) 2 g in sodium chloride 0.9 % 100 mL IVPB        2 g 200 mL/hr over 30 Minutes Intravenous Every 12 hours 03/11/20 1336 03/12/20 1639   03/11/20 0600  cefoTEtan (CEFOTAN) 2 g in sodium chloride 0.9 % 100 mL IVPB        2 g 200 mL/hr over 30 Minutes Intravenous On call to O.R. 03/11/20 0543 03/11/20 0755      Assessment/Plan: s/p Procedure(s): ROBOTIC ASSISTED COLOSTOMY REVERSAL, RIGID PROCTOSCOPY (N/A) CYSTOSCOPY WITH BILATERAL FIREFLY INJECTION (Bilateral) Plan for discharge tomorrow  [name] to shower   LOS: 2 days     Wesley [name] 03/13/2020

## 2020-03-14 LAB — CBC
HCT: 38.9 % — ABNORMAL LOW (ref 39.0–52.0)
Hemoglobin: 13 g/dL (ref 13.0–17.0)
MCH: 33.4 pg (ref 26.0–34.0)
MCHC: 33.4 g/dL (ref 30.0–36.0)
MCV: 100 fL (ref 80.0–100.0)
Platelets: 182 10*3/uL (ref 150–400)
RBC: 3.89 MIL/uL — ABNORMAL LOW (ref 4.22–5.81)
RDW: 14.6 % (ref 11.5–15.5)
WBC: 11.6 10*3/uL — ABNORMAL HIGH (ref 4.0–10.5)
nRBC: 0 % (ref 0.0–0.2)

## 2020-03-14 LAB — BASIC METABOLIC PANEL
Anion gap: 7 (ref 5–15)
BUN: 16 mg/dL (ref 8–23)
CO2: 24 mmol/L (ref 22–32)
Calcium: 8.6 mg/dL — ABNORMAL LOW (ref 8.9–10.3)
Chloride: 102 mmol/L (ref 98–111)
Creatinine, Ser: 0.72 mg/dL (ref 0.61–1.24)
GFR calc Af Amer: >60 mL/min (ref >60)
GFR calc non Af Amer: >60 mL/min (ref >60)
Glucose, Bld: 98 mg/dL (ref 70–99)
Potassium: 4.1 mmol/L (ref 3.5–5.1)
Sodium: 133 mmol/L — ABNORMAL LOW (ref 135–145)

## 2020-03-14 MED ORDER — TRAMADOL HCL 50 MG PO TABS: 50.0000 mg | ORAL_TABLET | Freq: Four times a day (QID) | ORAL | 0 refills | Status: DC | PRN

## 2020-03-14 NOTE — Discharge Summary (Signed)
Physician Discharge Summary  Patient ID: Wesley [name] MRN: [id number] DOB/AGE: 12-17-1940 79 y.o.  Admit date: 03/11/2020 Discharge date: 03/14/2020  Admission Diagnoses: Diverging colostomy  Discharge Diagnoses:  Active Problems:   Colostomy status [facility])   Discharged Condition: good  Hospital Course: Patient is a 79 year old male who came in secondary to colostomy and was scheduled to have this reversed.  Please see operative note for full details.  Postoperative patient was sent to the floor.  He was started on liquid diet advance to regular diet which he was able to tolerate well.  Patient had good pain control.  Patient was otherwise ambulating well on his own.  Patient was having good bowel function.  He was otherwise afebrile.  Patient was otherwise deemed stable for discharge and discharged home.  Consults: None  Significant Diagnostic Studies: None  Treatments: surgery: As above  Discharge Exam: Blood pressure 119/85, pulse 81, temperature 98 F (36.7 C), temperature source Oral, resp. rate 18, height 5\' 9"  (1.753 m), weight 86.9 kg, SpO2 98 %. General appearance: alert and cooperative GI: soft, non-tender; bowel sounds normal; no masses,  no organomegaly and Left lower quadrant incision with packing removed, some mild surrounding erythema, no drainage.  Disposition: Discharge disposition: 01-Home or Self Care       Discharge Instructions    Diet - low sodium heart healthy   Complete by: As directed    Increase activity slowly   Complete by: As directed      Allergies as of 03/14/2020      Reactions   Demerol Other (See Comments)   Causes dizziness   Promethazine Hcl Other (See Comments)   Dizziness      Medication List    TAKE these medications   acetaminophen 500 MG tablet Commonly known as: TYLENOL Take 2 tablets (1,000 mg total) by mouth every 8 (eight) hours as needed for mild pain or moderate pain.   aspirin EC 81 MG tablet Take 81 mg by  mouth every other day. Swallow whole.   atorvastatin 80 MG tablet Commonly known as: LIPITOR TAKE 1 TABLET EVERY DAY  AT  6  PM What changed: See the new instructions.   cetirizine 10 MG tablet Commonly known as: ZYRTEC Take 10 mg by mouth daily. ALLERTEC  AT 2 AM DAILY   cholecalciferol 25 MCG (1000 UNIT) tablet Commonly known as: VITAMIN D3 Take 1,000 Units by mouth daily.   Chromium Picolinate 1000 MCG Tabs Take 1,000 mcg by mouth daily.   finasteride 5 MG tablet Commonly known as: PROSCAR Take 5 mg by mouth every morning.   Fish Oil 1200 MG Caps Take 1,200 mg by mouth daily.   fluticasone 50 MCG/ACT nasal spray Commonly known as: FLONASE Place 2 sprays into both nostrils daily as needed for allergies or rhinitis.   lisinopril 10 MG tablet Commonly known as: ZESTRIL Take 10 mg by mouth every morning.   loratadine 10 MG tablet Commonly known as: CLARITIN Take 10 mg by mouth daily.   Magnesium 400 MG Tabs Take 400 mg by mouth daily.   traMADol 50 MG tablet Commonly known as: ULTRAM Take 1 tablet (50 mg total) by mouth every 6 (six) hours as needed for moderate pain or severe pain.   Vitamin B-12 5000 MCG Tbdp Take 5,000 mcg by mouth daily.       Follow-up Information    [facility]   Specialty: Rehabilitation Why: they will call you to set up  appt. Contact information: [address] [place] [address] [phone]       Leighton Ruff, MD. Schedule an appointment as soon as possible for a visit in 2 week(s).   Specialty: General Surgery Contact information: [place] [address] [phone]               Signed: [name] 03/14/2020, 8:10 AM

## 2020-03-14 NOTE — Progress Notes (Signed)
Instructions were reviewed with patient. All questions were answered. Patient was transported to main entrance by wheelchair. ° °

## 2020-03-29 ENCOUNTER — Other Ambulatory Visit: Payer: Self-pay | Admitting: Cardiology

## 2020-04-06 ENCOUNTER — Ambulatory Visit: Payer: Medicare Other | Attending: General Surgery

## 2020-04-06 ENCOUNTER — Other Ambulatory Visit: Payer: Self-pay

## 2020-04-06 DIAGNOSIS — M6281 Muscle weakness (generalized): Secondary | ICD-10-CM | POA: Insufficient documentation

## 2020-04-06 DIAGNOSIS — R2681 Unsteadiness on feet: Secondary | ICD-10-CM | POA: Insufficient documentation

## 2020-04-06 NOTE — Therapy (Signed)
[facility] [address] [place], [place], [address] Phone: [phone]   Fax:  [phone]  Physical Therapy Evaluation  Patient Details  Name: Wesley [name] MRN: [id number] Date of Birth: 1940-10-31 Referring Provider (PT): Dr. Sherrilyn Rist Date: 04/06/2020   PT End of Session - 04/06/20 2101    Visit Number 1    Number of Visits 17    Date for PT Re-Evaluation 06/01/20    Authorization - Visit Number 1    Progress Note Due on Visit 10    PT Start Time 9450    PT Stop Time 1530    PT Time Calculation (min) 45 min    Equipment Utilized During Treatment Gait belt    Activity Tolerance Patient tolerated treatment well    Behavior During Therapy [facility] for tasks assessed/performed           Past Medical History:  Diagnosis Date  . 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 CAD (coronary artery disease)   . Cholelithiasis   . Chronic allergic rhinitis   . Coronary atherosclerosis of native coronary artery   . Fatty liver   . GERD (gastroesophageal reflux disease)   . High blood pressure   . Hypercholesterolemia   . Macrocytosis without anemia   . OSA on CPAP   . Paraesophageal hernia   . Partial bowel obstruction ([place])   . 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 Sleep apnea   . Umbilical hernia without obstruction or gangrene     Past Surgical History:  Procedure Laterality Date  . BIOPSY  10/29/2019   Procedure: BIOPSY;  Surgeon: Jerene Bears, MD;  Location: [facility];  Service: Gastroenterology;;  . CARDIAC CATHETERIZATION  2000   Cath to rule out cardiac problems RT HTN. PT denies significant findings  . CARDIAC CATHETERIZATION  2008  . COLOSTOMY N/A 10/31/2019   Procedure: COLOSTOMY;  Surgeon: Erroll Luna, MD;  Location: [facility];  Service: General;  Laterality: N/A;  . CYSTOSCOPY W/ URETERAL STENT PLACEMENT Bilateral 10/31/2019   Procedure:  CYSTOSCOPY URETERAL STENT PLACEMENT BILATERAL;  Surgeon: Franchot Gallo, MD;  [facility];  Service: Urology;  Laterality: Bilateral;  . CYSTOSCOPY WITH STENT PLACEMENT Bilateral 03/11/2020   Procedure: CYSTOSCOPY WITH BILATERAL FIREFLY INJECTION;  Surgeon: Irine Seal, MD;  [facility];  Service: Urology;  Laterality: Bilateral;  . EYE SURGERY     BILATERAL CATARACT SURGERY WITH LENS IMPLANTS  . FLEXIBLE SIGMOIDOSCOPY N/A 10/29/2019   Procedure: FLEXIBLE SIGMOIDOSCOPY;  Surgeon: Jerene Bears, MD;  Location: [facility];  Service: Gastroenterology;  Laterality: N/A;  . INSERTION OF MESH N/A 09/08/2016   Procedure: INSERTION OF MESH;  Surgeon: Jackolyn Confer, MD;  [facility];  Service: General;  Laterality: N/A;  . LAPAROSCOPIC SIGMOID COLECTOMY N/A 10/31/2019   Procedure: DIAGNOSTIC LAPAROSCOPY; EXPLORATORY LAPAROTOMY; SIGMOID COLECTOMY;  Surgeon: Erroll Luna, MD;  [facility];  Service: General;  Laterality: N/A;  . right rotator cuff    . ROTATOR CUFF REPAIR    . SUBMUCOSAL TATTOO INJECTION  10/29/2019   Procedure: SUBMUCOSAL TATTOO INJECTION;  Surgeon: Jerene Bears, MD;  [facility];  Service: Gastroenterology;;  . TONSILLECTOMY    . UMBILICAL HERNIA REPAIR N/A 09/08/2016   Procedure: OPEN UMBILICAL HERNIA REPAIR WITH MESH;  Surgeon: Jackolyn Confer, MD;  [facility];  Service: General;  Laterality: N/A;  . XI ROBOTIC ASSISTED COLOSTOMY  TAKEDOWN N/A 03/11/2020   Procedure: ROBOTIC ASSISTED COLOSTOMY REVERSAL, RIGID PROCTOSCOPY;  Surgeon: Leighton Ruff, MD;  [facility];  Service: General;  Laterality: N/A;    There were no vitals filed for this visit.    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 He had colostomy reversal on 03/11/20. Pt reports burning started in February of  this year, about a a month before his surgery. Burning is only on the sole of the feet and equal on both feet.    Pertinent History CAD, HTN, HLD, OSA, BPH, GERD    Limitations Walking    How long can you sit comfortably? no issues    Patient Stated Goals be able to walk my dogs for 20 minutes, to play some light tennis with grand daughter,    Currently in Pain? No/denies              [facility] PT Assessment - 04/06/20 0001      Assessment   Medical Diagnosis weakness    Referring Provider (PT) Dr. Marcello Moores    Onset Date/Surgical Date 03/12/20 (P)       Precautions   Precautions None (P)       Balance Screen   Has the patient fallen in the past 6 months No (P)       Home Environment   Living Environment Private residence (P)     Living Arrangements Spouse/significant other (P)     Available Help at Discharge Family (P)       Prior Function   Level of Independence Independent (P)       ROM / Strength   AROM / PROM / Strength Strength (P)       Strength   Strength Assessment Site Ankle;Hip (P)     Right/Left Hip Right;Left (P)     Right Hip Flexion 5/5 (P)     Left Hip Flexion 5/5 (P)     Right/Left Ankle Right;Left (P)     Right Ankle Dorsiflexion 4/5 (P)     Right Ankle Plantar Flexion 2+/5 (P)     Left Ankle Dorsiflexion 4/5 (P)     Left Ankle Plantar Flexion 2+/5 (P)       Ambulation/Gait   Ambulation/Gait Yes (P)             Treatment Seated plantarflexion: blue band: 2 x 10 R and L Seated unilateral heel raises: 2 x 10          Objective measurements completed on examination: See above findings.                 PT Short Term Goals - 04/06/20 1519      PT SHORT TERM GOAL #1   Title Patient will be able to walk for 10 min with cane or no AD to improve walking endurance    Baseline walks around at home for <5 min before getting tired.    Time 4    Period Weeks    Status New    Target Date 05/04/20      PT SHORT TERM GOAL #2    Title Patient will demo 100' improvement on BBS without AD to improve walking endurance    Baseline 1045' without AD    Time 4    Period Weeks    Status New  Target Date 05/04/20      PT SHORT TERM GOAL #3   Title Patient will demo 5 sec of SLS to improve dynamic balance when stepping over obstacles.    Baseline TBD    Time 4    Period Weeks    Status New    Target Date 05/04/20             PT Long Term Goals - 04/06/20 2032      PT LONG TERM GOAL #1   Title Patient will be able to walk on uneven grounds, grassy hill incline and decline for 200' without AD to improve gait on uneven grounds    Baseline pt has not attempted    Time 8    Period Weeks    Status New    Target Date 06/01/20      PT LONG TERM GOAL #2   Title Patient will be able to ambulate 1250 feet or more in 6 minutes to improve walking endurance without AD    Baseline 1045    Time 8    Period Weeks    Status New    Target Date 06/01/20      PT LONG TERM GOAL #3   Title Patient will be able to stand 30 seconds for all positions during modified CTSIB    Baseline TBD    Time 8    Period Weeks    Status New    Target Date 06/01/20                  Plan - 04/06/20 2037    Clinical Impression Statement Patient is a 79 y.o. male who was seen today for physical therapy evaluation and treatment for gait and balance disorder. Patient demonstrates significant decrease in strength of bil plantarflexors. Patient also demonstrates decreased balance and propriception. Patient also demonstrates decreased walking endurnace according to 6 minute walk test compared to age related norms. Patient will benefit from skilled PT to address his impairments of strength and balance to improve overall function.    Personal Factors and Comorbidities Comorbidity 2;Past/Current Experience;Time since onset of injury/illness/exacerbation    Comorbidities CAD, HTN, HLD, OSA, BPH, GERD    Examination-Activity Limitations  Stand;Squat    Examination-Participation Restrictions Yard Work;Community Activity;Cleaning    Stability/Clinical Decision Making Stable/Uncomplicated    Clinical Decision Making Low    Rehab Potential Good    PT Frequency 2x / week    PT Duration 8 weeks    PT Treatment/Interventions ADLs/Self Care Home Management;Gait training;Stair training;Functional mobility training;Therapeutic activities;Therapeutic exercise;Balance training;Neuromuscular re-education;Manual techniques;Patient/family education;Passive range of motion;Energy conservation;Joint Manipulations    PT Next Visit Plan Perform modified CTSIB, SLS    PT Home Exercise Plan Access Code: [email or web address]: [email or web address]: 08/24/2021Prepared by: Gwenyth Bouillon PatelExercisesSeated Ankle Plantarflexion with Resistance - 1 x daily - 7 x weekly - 2 sets - 10 repsSeated Heel Raise - 1 x daily - 7 x weekly - 2 sets - 10 reps    Consulted and Agree with Plan of Care Patient           Patient will benefit from skilled therapeutic intervention in order to improve the following deficits and impairments:  Abnormal gait, Decreased activity tolerance, Decreased balance, Decreased endurance, Decreased range of motion, Decreased strength, Impaired flexibility, Impaired tone  Visit Diagnosis: Muscle weakness (generalized)  Unsteadiness on feet     Problem List Patient Active Problem List   Diagnosis Date Noted  . 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  Partial obstruction of colon ([place])   . Generalized abdominal pain   . Abdominal bloating   . 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 Colonic mass 10/28/2019  . Hypokalemia 10/28/2019  . 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 Non-traumatic rotator cuff tear 09/21/2017  . Syncope and collapse 08/12/2013  . Back pain 08/12/2013  . CAD (coronary artery disease) 08/12/2013  . High blood pressure 03/15/2011  . GERD (gastroesophageal reflux disease)  03/15/2011  . Paronychia of great toe of left foot 03/15/2011    Kerrie Pleasure, PT 04/06/2020, 9:15 PM  [facility] [address] [place], [place], [address] Phone: [phone]   Fax:  [phone]  Name: KEIR VIERNES MRN: [id number] Date of Birth: November 24, 1940

## 2020-04-06 NOTE — Patient Instructions (Signed)
Access Code: [email or web address] URL: [email or web address] Date: 04/06/2020 Prepared by: Markus Jarvis  Exercises Seated Ankle Plantarflexion with Resistance - 1 x daily - 7 x weekly - 2 sets - 10 reps Seated Heel Raise - 1 x daily - 7 x weekly - 2 sets - 10 reps

## 2020-04-08 ENCOUNTER — Other Ambulatory Visit: Payer: Self-pay

## 2020-04-08 ENCOUNTER — Ambulatory Visit: Payer: Medicare Other

## 2020-04-08 DIAGNOSIS — R2681 Unsteadiness on feet: Secondary | ICD-10-CM | POA: Diagnosis not present

## 2020-04-08 DIAGNOSIS — M6281 Muscle weakness (generalized): Secondary | ICD-10-CM | POA: Diagnosis not present

## 2020-04-08 NOTE — Patient Instructions (Signed)
Access Code: [email or web address] URL: [email or web address] Date: 04/08/2020 Prepared by: Baldomero Lamy  Exercises Seated Ankle Plantarflexion with Resistance - 1 x daily - 7 x weekly - 2 sets - 10 reps Seated Heel Raise - 1 x daily - 7 x weekly - 2 sets - 10 reps Romberg Stance with Eyes Closed - 1 x daily - 7 x weekly - 1 sets - 3 reps - 15-30 seconds hold Romberg Stance on Foam Pad with Head Rotation - 1 x daily - 7 x weekly - 2 sets - 10 reps Romberg Stance with Head Nods on Foam Pad - 1 x daily - 7 x weekly - 2 sets - 10 reps Tandem Stance - 1 x daily - 7 x weekly - 1 sets - 3 reps - 20-30 seconds hold Standing Single Leg Stance with Counter Support - 1 x daily - 7 x weekly - 1 sets - 3 reps - 10-15 seconds hold Standing Toe Taps - 1 x daily - 7 x weekly - 2 sets - 10 reps

## 2020-04-08 NOTE — Therapy (Signed)
[facility] [address] [place], [place], [address] Phone: [phone]   Fax:  [phone]  Physical Therapy Treatment  Patient Details  Name: Wesley [name] MRN: [id number] Date of Birth: [birth year] Referring Provider (PT): Dr. Sherrilyn Rist Date: 04/08/2020   PT End of Session - 04/08/20 1534    Visit Number 2    Number of Visits 17    Date for PT Re-Evaluation 06/01/20    Authorization - Visit Number 1    Progress Note Due on Visit 10    PT Start Time 2585    PT Stop Time 1613    PT Time Calculation (min) 43 min    Equipment Utilized During Treatment Gait belt    Activity Tolerance Patient tolerated treatment well    Behavior During Therapy [facility] for tasks assessed/performed           Past Medical History:  Diagnosis Date  . 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 CAD (coronary artery disease)   . Cholelithiasis   . Chronic allergic rhinitis   . Coronary atherosclerosis of native coronary artery   . Fatty liver   . GERD (gastroesophageal reflux disease)   . High blood pressure   . Hypercholesterolemia   . Macrocytosis without anemia   . OSA on CPAP   . Paraesophageal hernia   . Partial bowel obstruction ([place])   . 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 Sleep apnea   . Umbilical hernia without obstruction or gangrene     Past Surgical History:  Procedure Laterality Date  . BIOPSY  10/29/2019   Procedure: BIOPSY;  Surgeon: Jerene Bears, MD;  Location: [facility];  Service: Gastroenterology;;  . CARDIAC CATHETERIZATION  2000   Cath to rule out cardiac problems RT HTN. PT denies significant findings  . CARDIAC CATHETERIZATION  2008  . COLOSTOMY N/A 10/31/2019   Procedure: COLOSTOMY;  Surgeon: Erroll Luna, MD;  [facility];  Service: General;  Laterality: N/A;  . CYSTOSCOPY W/ URETERAL STENT PLACEMENT Bilateral 10/31/2019   Procedure:  CYSTOSCOPY URETERAL STENT PLACEMENT BILATERAL;  Surgeon: Franchot Gallo, MD;  [facility];  Service: Urology;  Laterality: Bilateral;  . CYSTOSCOPY WITH STENT PLACEMENT Bilateral 03/11/2020   Procedure: CYSTOSCOPY WITH BILATERAL FIREFLY INJECTION;  Surgeon: Irine Seal, MD;  [facility];  Service: Urology;  Laterality: Bilateral;  . EYE SURGERY     BILATERAL CATARACT SURGERY WITH LENS IMPLANTS  . FLEXIBLE SIGMOIDOSCOPY N/A 10/29/2019   Procedure: FLEXIBLE SIGMOIDOSCOPY;  Surgeon: Jerene Bears, MD;  Location: [facility];  Service: Gastroenterology;  Laterality: N/A;  . INSERTION OF MESH N/A 09/08/2016   Procedure: INSERTION OF MESH;  Surgeon: Jackolyn Confer, MD;  [facility];  Service: General;  Laterality: N/A;  . LAPAROSCOPIC SIGMOID COLECTOMY N/A 10/31/2019   Procedure: DIAGNOSTIC LAPAROSCOPY; EXPLORATORY LAPAROTOMY; SIGMOID COLECTOMY;  Surgeon: Erroll Luna, MD;  [facility];  Service: General;  Laterality: N/A;  . right rotator cuff    . ROTATOR CUFF REPAIR    . SUBMUCOSAL TATTOO INJECTION  10/29/2019   Procedure: SUBMUCOSAL TATTOO INJECTION;  Surgeon: Jerene Bears, MD;  [facility];  Service: Gastroenterology;;  . TONSILLECTOMY    . UMBILICAL HERNIA REPAIR N/A 09/08/2016   Procedure: OPEN UMBILICAL HERNIA REPAIR WITH MESH;  Surgeon: Jackolyn Confer, MD;  [facility];  Service: General;  Laterality: N/A;  . XI ROBOTIC ASSISTED COLOSTOMY  TAKEDOWN N/A 03/11/2020   Procedure: ROBOTIC ASSISTED COLOSTOMY REVERSAL, RIGID PROCTOSCOPY;  Surgeon: Leighton Ruff, MD;  [facility];  Service: General;  Laterality: N/A;    There were no vitals filed for this visit.   Subjective Assessment - 04/08/20 1532    Subjective Patient reports that the exercises have been going well. Also has been trying to walk in the morning.    Pertinent History CAD, HTN, HLD, OSA, BPH, GERD    Limitations Walking    How long can you sit comfortably? no issues    Patient  Stated Goals be able to walk my dogs for 20 minutes, to play some light tennis with grand daughter,    Currently in Pain? Yes    Pain Score 3     Pain Location Foot    Pain Orientation Right;Left   bottom of the feet   Pain Descriptors / Indicators Burning    Pain Type Chronic pain                             [facility] Adult PT Treatment/Exercise - 04/08/20 0001      Ambulation/Gait   Ambulation/Gait Yes    Ambulation/Gait Assistance 6: Modified independent (Device/Increase time)    Assistive device None    Gait Pattern Decreased stance time - right;Decreased stance time - left;Left steppage;Right steppage    Ambulation Surface Level;Indoor    Gait Comments throughout therapy session with actvities       Neuro Re-ed    Neuro Re-ed Details  Completed alternating toe taps to bottom step, 1 x 10 reps initially with BUE and progressed to single UE x 10 reps. Completed M-CSTIB, patient able to complete situation 1 and situation 3 for full 30 seconds. Patient able to hold situation 2 for avg: 12-14 seconds, situation 4: unable to hold 3 seconds.                Balance Exercises - 04/08/20 0001      Balance Exercises: Standing   Standing Eyes Opened Narrow base of support (BOS);Head turns;Foam/compliant surface;Limitations    Standing Eyes Opened Limitations completed 2 x 10 reps of horizontal/vertical head turns.     Standing Eyes Closed Narrow base of support (BOS);Foam/compliant surface;3 reps;30 secs    Tandem Stance Eyes open;Intermittent upper extremity support;3 reps;30 secs    SLS Eyes open;2 reps;Solid surface;Upper extremity support 1;10 secs;Limitations    SLS Limitations completed at countertop with light UE support    Tandem Gait Forward;3 reps;Upper extremity support;Limitations    Tandem Gait Limitations provided UE support from countertop          Reviewed HEP and added the following additions for further focus on balance. Educated to complete all  in corner within home with chair placed in front for improved safety.    Access Code: [email or web address] URL: [email or web address] Date: 04/08/2020 Prepared by: Baldomero Lamy  Exercises Seated Ankle Plantarflexion with Resistance - 1 x daily - 7 x weekly - 2 sets - 10 reps Seated Heel Raise - 1 x daily - 7 x weekly - 2 sets - 10 reps Romberg Stance with Eyes Closed - 1 x daily - 7 x weekly - 1 sets - 3 reps - 15-30 seconds hold Romberg Stance on Foam Pad with Head Rotation - 1 x daily - 7 x weekly - 2 sets - 10 reps Romberg Stance with Head Nods on Foam Pad - 1 x daily -  7 x weekly - 2 sets - 10 reps Tandem Stance - 1 x daily - 7 x weekly - 1 sets - 3 reps - 20-30 seconds hold Standing Single Leg Stance with Counter Support - 1 x daily - 7 x weekly - 1 sets - 3 reps - 10-15 seconds hold Standing Toe Taps - 1 x daily - 7 x weekly - 2 sets - 10 reps    PT Education - 04/08/20 1702    Education Details HEP Update    Person(s) Educated Patient    Methods Explanation;Demonstration;Handout    Comprehension Verbalized understanding;Returned demonstration;Need further instruction            PT Short Term Goals - 04/06/20 1519      PT SHORT TERM GOAL #1   Title Patient will be able to walk for 10 min with cane or no AD to improve walking endurance    Baseline walks around at home for <5 min before getting tired.    Time 4    Period Weeks    Status New    Target Date 05/04/20      PT SHORT TERM GOAL #2   Title Patient will demo 100' improvement on BBS without AD to improve walking endurance    Baseline 1045' without AD    Time 4    Period Weeks    Status New    Target Date 05/04/20      PT SHORT TERM GOAL #3   Title Patient will demo 5 sec of SLS to improve dynamic balance when stepping over obstacles.    Baseline TBD    Time 4    Period Weeks    Status New    Target Date 05/04/20             PT Long Term Goals - 04/06/20 2032      PT LONG TERM GOAL #1    Title Patient will be able to walk on uneven grounds, grassy hill incline and decline for 200' without AD to improve gait on uneven grounds    Baseline pt has not attempted    Time 8    Period Weeks    Status New    Target Date 06/01/20      PT LONG TERM GOAL #2   Title Patient will be able to ambulate 1250 feet or more in 6 minutes to improve walking endurance without AD    Baseline 1045    Time 8    Period Weeks    Status New    Target Date 06/01/20      PT LONG TERM GOAL #3   Title Patient will be able to stand 30 seconds for all positions during modified CTSIB    Baseline TBD    Time 8    Period Weeks    Status New    Target Date 06/01/20                 Plan - 04/08/20 1659    Clinical Impression Statement Today's skilled PT session included initiating corner balance exercises and adding as additions to HEP as tolerated by patient. Educated on completion in corner within home for safety. Will continue to progress toward goals.    Personal Factors and Comorbidities Comorbidity 2;Past/Current Experience;Time since onset of injury/illness/exacerbation    Comorbidities CAD, HTN, HLD, OSA, BPH, GERD    Examination-Activity Limitations Stand;Squat    Examination-Participation Restrictions Yard Work;Community Activity;Cleaning    Stability/Clinical Decision Making Stable/Uncomplicated  Rehab Potential Good    PT Frequency 2x / week    PT Duration 8 weeks    PT Treatment/Interventions ADLs/Self Care Home Management;Gait training;Stair training;Functional mobility training;Therapeutic activities;Therapeutic exercise;Balance training;Neuromuscular re-education;Manual techniques;Patient/family education;Passive range of motion;Energy conservation;Joint Manipulations    PT Next Visit Plan How was HEP additions? Continued balance. Rockerboard. Toe Taps. SLS activites. SciFit/Nustep for endurance    PT Home Exercise Plan Access Code: [email or web address]    Consulted and Agree with Plan of  Care Patient           Patient will benefit from skilled therapeutic intervention in order to improve the following deficits and impairments:  Abnormal gait, Decreased activity tolerance, Decreased balance, Decreased endurance, Decreased range of motion, Decreased strength, Impaired flexibility, Impaired tone  Visit Diagnosis: Muscle weakness (generalized)  Unsteadiness on feet     Problem List Patient Active Problem List   Diagnosis Date Noted  . 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 Partial obstruction of colon ([place])   . Generalized abdominal pain   . Abdominal bloating   . 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 Colonic mass 10/28/2019  . Hypokalemia 10/28/2019  . 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 Non-traumatic rotator cuff tear 09/21/2017  . Syncope and collapse 08/12/2013  . Back pain 08/12/2013  . CAD (coronary artery disease) 08/12/2013  . High blood pressure 03/15/2011  . GERD (gastroesophageal reflux disease) 03/15/2011  . Paronychia of great toe of left foot 03/15/2011    Jones Bales, PT, DPT 04/08/2020, 5:03 PM  [facility] [address] [place], [place], [address] Phone: [phone]   Fax:  [phone]  Name: OCTAVIA MOTTOLA MRN: [id number] Date of Birth: Nov 04, 1940

## 2020-04-12 ENCOUNTER — Other Ambulatory Visit: Payer: Self-pay

## 2020-04-12 ENCOUNTER — Ambulatory Visit: Payer: Medicare Other

## 2020-04-12 DIAGNOSIS — R2681 Unsteadiness on feet: Secondary | ICD-10-CM

## 2020-04-12 DIAGNOSIS — M6281 Muscle weakness (generalized): Secondary | ICD-10-CM | POA: Diagnosis not present

## 2020-04-12 NOTE — Patient Instructions (Signed)
Access Code: [email or web address] URL: [email or web address] Date: 04/08/2020 Prepared by: Baldomero Lamy  Exercises Seated Ankle Plantarflexion with Resistance - 1 x daily - 7 x weekly - 2 sets - 10 reps Seated Heel Raise - 1 x daily - 7 x weekly - 2 sets - 10 reps Romberg Stance with Eyes Closed - 1 x daily - 7 x weekly - 1 sets - 3 reps - 15-30 seconds hold Romberg Stance on Foam Pad with Head Rotation - 1 x daily - 7 x weekly - 2 sets - 10 reps Romberg Stance with Head Nods on Foam Pad - 1 x daily - 7 x weekly - 2 sets - 10 reps Tandem Stance - 1 x daily - 7 x weekly - 1 sets - 3 reps - 20-30 seconds hold Standing Single Leg Stance with Counter Support - 1 x daily - 7 x weekly - 1 sets - 3 reps - 10-15 seconds hold Standing Toe Taps - 1 x daily - 7 x weekly - 2 sets - 10 reps

## 2020-04-12 NOTE — Therapy (Signed)
[facility] [address] [place], [place], [address] Phone: [phone]   Fax:  [phone]  Physical Therapy Treatment  Patient Details  Name: Wesley [name] MRN: [id number] Date of Birth: 07/06/1941 Referring Provider (PT): Dr. Sherrilyn Rist Date: 04/12/2020   PT End of Session - 04/12/20 1358    Visit Number 3    Number of Visits 17    Date for PT Re-Evaluation 06/01/20    Authorization - Visit Number 3    Progress Note Due on Visit 10    PT Start Time 1315    PT Stop Time 1400    PT Time Calculation (min) 45 min    Equipment Utilized During Treatment Gait belt    Activity Tolerance Patient tolerated treatment well    Behavior During Therapy [facility] for tasks assessed/performed           Past Medical History:  Diagnosis Date  . 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 CAD (coronary artery disease)   . Cholelithiasis   . Chronic allergic rhinitis   . Coronary atherosclerosis of native coronary artery   . Fatty liver   . GERD (gastroesophageal reflux disease)   . High blood pressure   . Hypercholesterolemia   . Macrocytosis without anemia   . OSA on CPAP   . Paraesophageal hernia   . Partial bowel obstruction ([place])   . 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 Sleep apnea   . Umbilical hernia without obstruction or gangrene     Past Surgical History:  Procedure Laterality Date  . BIOPSY  10/29/2019   Procedure: BIOPSY;  Surgeon: Jerene Bears, MD;  Location: [facility];  Service: Gastroenterology;;  . CARDIAC CATHETERIZATION  2000   Cath to rule out cardiac problems RT HTN. PT denies significant findings  . CARDIAC CATHETERIZATION  2008  . COLOSTOMY N/A 10/31/2019   Procedure: COLOSTOMY;  Surgeon: Erroll Luna, MD;  Location: [facility];  Service: General;  Laterality: N/A;  . CYSTOSCOPY W/ URETERAL STENT PLACEMENT Bilateral 10/31/2019   Procedure:  CYSTOSCOPY URETERAL STENT PLACEMENT BILATERAL;  Surgeon: Franchot Gallo, MD;  [facility];  Service: Urology;  Laterality: Bilateral;  . CYSTOSCOPY WITH STENT PLACEMENT Bilateral 03/11/2020   Procedure: CYSTOSCOPY WITH BILATERAL FIREFLY INJECTION;  Surgeon: Irine Seal, MD;  [facility];  Service: Urology;  Laterality: Bilateral;  . EYE SURGERY     BILATERAL CATARACT SURGERY WITH LENS IMPLANTS  . FLEXIBLE SIGMOIDOSCOPY N/A 10/29/2019   Procedure: FLEXIBLE SIGMOIDOSCOPY;  Surgeon: Jerene Bears, MD;  Location: [facility];  Service: Gastroenterology;  Laterality: N/A;  . INSERTION OF MESH N/A 09/08/2016   Procedure: INSERTION OF MESH;  Surgeon: Jackolyn Confer, MD;  [facility];  Service: General;  Laterality: N/A;  . LAPAROSCOPIC SIGMOID COLECTOMY N/A 10/31/2019   Procedure: DIAGNOSTIC LAPAROSCOPY; EXPLORATORY LAPAROTOMY; SIGMOID COLECTOMY;  Surgeon: Erroll Luna, MD;  Location: [facility];  Service: General;  Laterality: N/A;  . right rotator cuff    . ROTATOR CUFF REPAIR    . SUBMUCOSAL TATTOO INJECTION  10/29/2019   Procedure: SUBMUCOSAL TATTOO INJECTION;  Surgeon: Jerene Bears, MD;  [facility];  Service: Gastroenterology;;  . TONSILLECTOMY    . UMBILICAL HERNIA REPAIR N/A 09/08/2016   Procedure: OPEN UMBILICAL HERNIA REPAIR WITH MESH;  Surgeon: Jackolyn Confer, MD;  [facility];  Service: General;  Laterality: N/A;  . XI ROBOTIC ASSISTED COLOSTOMY  TAKEDOWN N/A 03/11/2020   Procedure: ROBOTIC ASSISTED COLOSTOMY REVERSAL, RIGID PROCTOSCOPY;  Surgeon: Leighton Ruff, MD;  [facility];  Service: General;  Laterality: N/A;    There were no vitals filed for this visit.   Subjective Assessment - 04/12/20 1323    Subjective I am working on walking up to 9 min  a day.    Pertinent History CAD, HTN, HLD, OSA, BPH, GERD    Limitations Walking    How long can you sit comfortably? no issues    Patient Stated Goals be able to walk my dogs for 20 minutes, to play  some light tennis with grand daughter,            Seated ankle plantar flexion: bil 20x Seated ankle plantar flexioN: unilateral with contralateral ankle crossed over ipsilateral knee for some resistance: 2 x 10 R and L Partial tandem stance with 1 HHA and pushing off with back leg to engage plantarlfexors: 20x R and Wobble board: anterior and posterior tilts: 20x bil HHA Narrow BOS: on airex: EO: with horizontal and vertical head turns: 10x  Narrow BOS on airex: EC: 2 x 30" - educated pt to do this at home with shoulder width apart and then standing in corner with chair in front Walking fwd and bwd: 5 laps of 15' no HHA Walking lateral: 3 laps of 15' no HHA Walking tandem 1 HHA: 5x 10 feet fwd and bwd Sit to stand: 10lbs 2 x 10        Access Code: [email or web address] URL: [email or web address] Date: 04/08/2020 Prepared by: Baldomero Lamy  Exercises Seated Ankle Plantarflexion with Resistance - 1 x daily - 7 x weekly - 2 sets - 10 reps Seated Heel Raise - 1 x daily - 7 x weekly - 2 sets - 10 reps Romberg Stance with Eyes Closed - 1 x daily - 7 x weekly - 1 sets - 3 reps - 15-30 seconds hold Romberg Stance on Foam Pad with Head Rotation - 1 x daily - 7 x weekly - 2 sets - 10 reps Romberg Stance with Head Nods on Foam Pad - 1 x daily - 7 x weekly - 2 sets - 10 reps Tandem Stance - 1 x daily - 7 x weekly - 1 sets - 3 reps - 20-30 seconds hold Standing Single Leg Stance with Counter Support - 1 x daily - 7 x weekly - 1 sets - 3 reps - 10-15 seconds hold Standing Toe Taps - 1 x daily - 7 x weekly - 2 sets - 10 reps                        PT Short Term Goals - 04/06/20 1519      PT SHORT TERM GOAL #1   Title Patient will be able to walk for 10 min with cane or no AD to improve walking endurance    Baseline walks around at home for <5 min before getting tired.    Time 4    Period Weeks    Status New    Target Date 05/04/20      PT SHORT TERM GOAL #2    Title Patient will demo 100' improvement on BBS without AD to improve walking endurance    Baseline 1045' without AD    Time 4    Period Weeks    Status New    Target Date 05/04/20      PT SHORT TERM GOAL #3  Title Patient will demo 5 sec of SLS to improve dynamic balance when stepping over obstacles.    Baseline TBD    Time 4    Period Weeks    Status New    Target Date 05/04/20             PT Long Term Goals - 04/06/20 2032      PT LONG TERM GOAL #1   Title Patient will be able to walk on uneven grounds, grassy hill incline and decline for 200' without AD to improve gait on uneven grounds    Baseline pt has not attempted    Time 8    Period Weeks    Status New    Target Date 06/01/20      PT LONG TERM GOAL #2   Title Patient will be able to ambulate 1250 feet or more in 6 minutes to improve walking endurance without AD    Baseline 1045    Time 8    Period Weeks    Status New    Target Date 06/01/20      PT LONG TERM GOAL #3   Title Patient will be able to stand 30 seconds for all positions during modified CTSIB    Baseline TBD    Time 8    Period Weeks    Status New    Target Date 06/01/20                 Plan - 04/12/20 1356    Clinical Impression Statement Today's skilled session was focused on progresing exercises to strengthen ankle plantarflexors bil and progressing his balance and proprioception exercises. Pt demonstrates difficulty with balancing on non compliant surfaces with eyes closed.    Personal Factors and Comorbidities Comorbidity 2;Past/Current Experience;Time since onset of injury/illness/exacerbation    Comorbidities CAD, HTN, HLD, OSA, BPH, GERD    Examination-Activity Limitations Stand;Squat    Examination-Participation Restrictions Yard Work;Community Activity;Cleaning    Stability/Clinical Decision Making Stable/Uncomplicated    Rehab Potential Good    PT Frequency 2x / week    PT Duration 8 weeks    PT Treatment/Interventions  ADLs/Self Care Home Management;Gait training;Stair training;Functional mobility training;Therapeutic activities;Therapeutic exercise;Balance training;Neuromuscular re-education;Manual techniques;Patient/family education;Passive range of motion;Energy conservation;Joint Manipulations    PT Next Visit Plan continue to progress balance (non compliant/EC), SLS, strength of ankle plantarflexors    PT Home Exercise Plan Access Code: [email or web address]    Consulted and Agree with Plan of Care Patient           Patient will benefit from skilled therapeutic intervention in order to improve the following deficits and impairments:  Abnormal gait, Decreased activity tolerance, Decreased balance, Decreased endurance, Decreased range of motion, Decreased strength, Impaired flexibility, Impaired tone  Visit Diagnosis: Muscle weakness (generalized)  Unsteadiness on feet     Problem List Patient Active Problem List   Diagnosis Date Noted  . 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 Partial obstruction of colon ([place])   . Generalized abdominal pain   . Abdominal bloating   . 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 Colonic mass 10/28/2019  . Hypokalemia 10/28/2019  . 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 Non-traumatic rotator cuff tear 09/21/2017  . Syncope and collapse 08/12/2013  . Back pain 08/12/2013  . CAD (coronary artery disease) 08/12/2013  . High blood pressure 03/15/2011  . GERD (gastroesophageal reflux disease) 03/15/2011  . Paronychia of great toe of left foot 03/15/2011  Kerrie Pleasure, PT 04/12/2020, 1:59 PM  [facility] [address] [place], [place], [address] Phone: [phone]   Fax:  [phone]  Name: ATUL DELUCIA MRN: [id number] Date of Birth: 03/16/1941

## 2020-04-20 ENCOUNTER — Ambulatory Visit: Payer: Medicare Other | Attending: General Surgery

## 2020-04-20 ENCOUNTER — Other Ambulatory Visit: Payer: Self-pay

## 2020-04-20 DIAGNOSIS — R2681 Unsteadiness on feet: Secondary | ICD-10-CM | POA: Insufficient documentation

## 2020-04-20 DIAGNOSIS — M6281 Muscle weakness (generalized): Secondary | ICD-10-CM | POA: Diagnosis not present

## 2020-04-20 NOTE — Therapy (Signed)
[facility] [address] [place], [place], [address] Phone: [phone]   Fax:  [phone]  Physical Therapy Treatment  Patient Details  Name: Wesley [name] MRN: [id number] Date of Birth: [birth year] Referring Provider (PT): Dr. Sherrilyn Rist Date: 04/20/2020   PT End of Session - 04/20/20 1153    Visit Number 4    Number of Visits 17    Date for PT Re-Evaluation 06/01/20    Authorization - Visit Number 4    Progress Note Due on Visit 10    Equipment Utilized During Treatment Gait belt    Activity Tolerance Patient tolerated treatment well    Behavior During Therapy [facility] for tasks assessed/performed           Past Medical History:  Diagnosis Date  . 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 CAD (coronary artery disease)   . Cholelithiasis   . Chronic allergic rhinitis   . Coronary atherosclerosis of native coronary artery   . Fatty liver   . GERD (gastroesophageal reflux disease)   . High blood pressure   . Hypercholesterolemia   . Macrocytosis without anemia   . OSA on CPAP   . Paraesophageal hernia   . Partial bowel obstruction ([place])   . 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 Sleep apnea   . Umbilical hernia without obstruction or gangrene     Past Surgical History:  Procedure Laterality Date  . BIOPSY  10/29/2019   Procedure: BIOPSY;  Surgeon: Jerene Bears, MD;  Location: [facility];  Service: Gastroenterology;;  . CARDIAC CATHETERIZATION  2000   Cath to rule out cardiac problems RT HTN. PT denies significant findings  . CARDIAC CATHETERIZATION  2008  . COLOSTOMY N/A 10/31/2019   Procedure: COLOSTOMY;  Surgeon: Erroll Luna, MD;  [facility];  Service: General;  Laterality: N/A;  . CYSTOSCOPY W/ URETERAL STENT PLACEMENT Bilateral 10/31/2019   Procedure: CYSTOSCOPY URETERAL STENT PLACEMENT BILATERAL;  Surgeon: Franchot Gallo, MD;   [facility];  Service: Urology;  Laterality: Bilateral;  . CYSTOSCOPY WITH STENT PLACEMENT Bilateral 03/11/2020   Procedure: CYSTOSCOPY WITH BILATERAL FIREFLY INJECTION;  Surgeon: Irine Seal, MD;  [facility];  Service: Urology;  Laterality: Bilateral;  . EYE SURGERY     BILATERAL CATARACT SURGERY WITH LENS IMPLANTS  . FLEXIBLE SIGMOIDOSCOPY N/A 10/29/2019   Procedure: FLEXIBLE SIGMOIDOSCOPY;  Surgeon: Jerene Bears, MD;  Location: [facility];  Service: Gastroenterology;  Laterality: N/A;  . INSERTION OF MESH N/A 09/08/2016   Procedure: INSERTION OF MESH;  Surgeon: Jackolyn Confer, MD;  [facility];  Service: General;  Laterality: N/A;  . LAPAROSCOPIC SIGMOID COLECTOMY N/A 10/31/2019   Procedure: DIAGNOSTIC LAPAROSCOPY; EXPLORATORY LAPAROTOMY; SIGMOID COLECTOMY;  Surgeon: Erroll Luna, MD;  [facility];  Service: General;  Laterality: N/A;  . right rotator cuff    . ROTATOR CUFF REPAIR    . SUBMUCOSAL TATTOO INJECTION  10/29/2019   Procedure: SUBMUCOSAL TATTOO INJECTION;  Surgeon: Jerene Bears, MD;  [facility];  Service: Gastroenterology;;  . TONSILLECTOMY    . UMBILICAL HERNIA REPAIR N/A 09/08/2016   Procedure: OPEN UMBILICAL HERNIA REPAIR WITH MESH;  Surgeon: Jackolyn Confer, MD;  [facility];  Service: General;  Laterality: N/A;  . XI ROBOTIC ASSISTED COLOSTOMY TAKEDOWN N/A 03/11/2020   Procedure: ROBOTIC ASSISTED COLOSTOMY REVERSAL, RIGID PROCTOSCOPY;  Surgeon: Leighton Ruff, MD;  [facility]: [facility];  Service:  General;  Laterality: N/A;    There were no vitals filed for this visit.   Subjective Assessment - 04/20/20 1108    Subjective I seem to be walking a little better. I am still walking up to 9 min a day. AFter 9 min, I start to sweat a bit, legs get little tired.    Pertinent History CAD, HTN, HLD, OSA, BPH, GERD    Limitations Walking    How long can you sit comfortably? no issues    Patient Stated Goals be able to walk my dogs for 20  minutes, to play some light tennis with grand daughter,              Grade III-IV IP joint mobilizations to  toes 2-3 to improve extension bil feet Grade III-IV MTP joint mobilizations tp tpes 2-3 to improve flexion bil feet Manually stretched IP joints into extension and MTP joints into flexion with prolonged holds Neuro Re-ed:  - isolated MTP/IP joint toe flexion (with manually stabilized foot in 0 deg neutral dorsiflexion): 2 x 20 R and L - Isolated MTP/IP joint toe flexion with plantarflexion (from 0 deg neutral to available range): 2 x 20 R and L Partial tandem standing with patient leans anterior against PT's resistancee (PT resisting at anterior shoulder with approximation through shoulders downward to engage plantarflexors in back leg): pt cued to engage plantarflexors by lifting back heel: 3 x 10" R and L  Seated heel raises with crossed contralateral ankle over ipsilateral knee: 10 no weight Seated heel raises: unilateral: 30lb kettle ball on knee: 3 x 10 R and L Tandem walking with 1 HHA on wall: 4 laps 15 feet                          PT Short Term Goals - 04/06/20 1519      PT SHORT TERM GOAL #1   Title Patient will be able to walk for 10 min with cane or no AD to improve walking endurance    Baseline walks around at home for <5 min before getting tired.    Time 4    Period Weeks    Status New    Target Date 05/04/20      PT SHORT TERM GOAL #2   Title Patient will demo 100' improvement on BBS without AD to improve walking endurance    Baseline 1045' without AD    Time 4    Period Weeks    Status New    Target Date 05/04/20      PT SHORT TERM GOAL #3   Title Patient will demo 5 sec of SLS to improve dynamic balance when stepping over obstacles.    Baseline TBD    Time 4    Period Weeks    Status New    Target Date 05/04/20             PT Long Term Goals - 04/06/20 2032      PT LONG TERM GOAL #1   Title Patient will be able to  walk on uneven grounds, grassy hill incline and decline for 200' without AD to improve gait on uneven grounds    Baseline pt has not attempted    Time 8    Period Weeks    Status New    Target Date 06/01/20      PT LONG TERM GOAL #2   Title Patient will be able to ambulate 1250  feet or more in 6 minutes to improve walking endurance without AD    Baseline 1045    Time 8    Period Weeks    Status New    Target Date 06/01/20      PT LONG TERM GOAL #3   Title Patient will be able to stand 30 seconds for all positions during modified CTSIB    Baseline TBD    Time 8    Period Weeks    Status New    Target Date 06/01/20                 Plan - 04/20/20 1150    Clinical Impression Statement Today's skilled session was focused on improivng mobility in forefoot. Patient has significant extension contractures in MTP joints of toes in bil foot and flexion contraction in IP joints of both toes. After manual therapy and stretching, patient was able to curl toes better and had improve toe tip contact with ground when walking. Patient is not yet able to perform heel raises in standing o partial weight bearing.    Personal Factors and Comorbidities Comorbidity 2;Past/Current Experience;Time since onset of injury/illness/exacerbation    Comorbidities CAD, HTN, HLD, OSA, BPH, GERD    Examination-Activity Limitations Stand;Squat    Examination-Participation Restrictions Yard Work;Community Activity;Cleaning    Stability/Clinical Decision Making Stable/Uncomplicated    Rehab Potential Good    PT Frequency 2x / week    PT Duration 8 weeks    PT Treatment/Interventions ADLs/Self Care Home Management;Gait training;Stair training;Functional mobility training;Therapeutic activities;Therapeutic exercise;Balance training;Neuromuscular re-education;Manual techniques;Patient/family education;Passive range of motion;Energy conservation;Joint Manipulations    PT Next Visit Plan continue to work on  improving plantarflexor strength, improve stretching of IP and MTP joints    PT Home Exercise Plan Access Code: [email or web address]    Consulted and Agree with Plan of Care Patient           Patient will benefit from skilled therapeutic intervention in order to improve the following deficits and impairments:  Abnormal gait, Decreased activity tolerance, Decreased balance, Decreased endurance, Decreased range of motion, Decreased strength, Impaired flexibility, Impaired tone  Visit Diagnosis: Unsteadiness on feet  Muscle weakness (generalized)     Problem List Patient Active Problem List   Diagnosis Date Noted  . 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 Partial obstruction of colon ([place])   . Generalized abdominal pain   . Abdominal bloating   . 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 Colonic mass 10/28/2019  . Hypokalemia 10/28/2019  . 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 Non-traumatic rotator cuff tear 09/21/2017  . Syncope and collapse 08/12/2013  . Back pain 08/12/2013  . CAD (coronary artery disease) 08/12/2013  . High blood pressure 03/15/2011  . GERD (gastroesophageal reflux disease) 03/15/2011  . Paronychia of great toe of left foot 03/15/2011    Kerrie Pleasure 04/20/2020, 12:10 PM  [facility] [address] [place], [place], [address] Phone: [phone]   Fax:  [phone]  Name: Wesley [name] MRN: [id number] Date of Birth: January 15, 1941

## 2020-04-21 ENCOUNTER — Other Ambulatory Visit: Payer: Self-pay | Admitting: Cardiology

## 2020-04-21 ENCOUNTER — Telehealth: Payer: Self-pay | Admitting: Internal Medicine

## 2020-04-21 NOTE — Telephone Encounter (Signed)
Please call for appointment

## 2020-04-21 NOTE — Telephone Encounter (Signed)
Please advise 

## 2020-04-21 NOTE — Telephone Encounter (Signed)
Pt called stating Dr. Randel Pigg referred pt and his wife to get established with Dr. Yong Channel. Pt states Dr. Randel Pigg spoke with Dr. Yong Channel to see if they could establish. Ok to schedule? Please advise.

## 2020-04-21 NOTE — Telephone Encounter (Signed)
Yes thanks-you may schedule both

## 2020-04-22 NOTE — Telephone Encounter (Signed)
Patient and wife both scheduled for January

## 2020-04-23 ENCOUNTER — Ambulatory Visit: Payer: Medicare Other

## 2020-04-23 ENCOUNTER — Other Ambulatory Visit: Payer: Self-pay

## 2020-04-23 DIAGNOSIS — M6281 Muscle weakness (generalized): Secondary | ICD-10-CM | POA: Diagnosis not present

## 2020-04-23 DIAGNOSIS — R2681 Unsteadiness on feet: Secondary | ICD-10-CM | POA: Diagnosis not present

## 2020-04-23 NOTE — Therapy (Signed)
[facility] [address] [place], [place], [address] Phone: [phone]   Fax:  [phone]  Physical Therapy Treatment  Patient Details  Name: Wesley [name] MRN: [id number] Date of Birth: Feb 18, 1941 Referring Provider (PT): Dr. Marcello Moores   Encounter Date: 04/23/2020   PT End of Session - 04/23/20 1318    Visit Number 5    Number of Visits 17    Date for PT Re-Evaluation 06/01/20    Authorization - Visit Number 4    Progress Note Due on Visit 10    PT Start Time 4496    PT Stop Time 1358    PT Time Calculation (min) 40 min    Equipment Utilized During Treatment Gait belt    Activity Tolerance Patient tolerated treatment well    Behavior During Therapy [facility] for tasks assessed/performed           Past Medical History:  Diagnosis Date  . 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 CAD (coronary artery disease)   . Cholelithiasis   . Chronic allergic rhinitis   . Coronary atherosclerosis of native coronary artery   . Fatty liver   . GERD (gastroesophageal reflux disease)   . High blood pressure   . Hypercholesterolemia   . Macrocytosis without anemia   . OSA on CPAP   . Paraesophageal hernia   . Partial bowel obstruction ([place])   . 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 Sleep apnea   . Umbilical hernia without obstruction or gangrene     Past Surgical History:  Procedure Laterality Date  . BIOPSY  10/29/2019   Procedure: BIOPSY;  Surgeon: Jerene Bears, MD;  Location: [facility];  Service: Gastroenterology;;  . CARDIAC CATHETERIZATION  2000   Cath to rule out cardiac problems RT HTN. PT denies significant findings  . CARDIAC CATHETERIZATION  2008  . COLOSTOMY N/A 10/31/2019   Procedure: COLOSTOMY;  Surgeon: Erroll Luna, MD;  Location: [facility];  Service: General;  Laterality: N/A;  . CYSTOSCOPY W/ URETERAL STENT PLACEMENT Bilateral 10/31/2019   Procedure:  CYSTOSCOPY URETERAL STENT PLACEMENT BILATERAL;  Surgeon: Franchot Gallo, MD;  Location: [facility];  Service: Urology;  Laterality: Bilateral;  . CYSTOSCOPY WITH STENT PLACEMENT Bilateral 03/11/2020   Procedure: CYSTOSCOPY WITH BILATERAL FIREFLY INJECTION;  Surgeon: Irine Seal, MD;  [facility];  Service: Urology;  Laterality: Bilateral;  . EYE SURGERY     BILATERAL CATARACT SURGERY WITH LENS IMPLANTS  . FLEXIBLE SIGMOIDOSCOPY N/A 10/29/2019   Procedure: FLEXIBLE SIGMOIDOSCOPY;  Surgeon: Jerene Bears, MD;  Location: [facility];  Service: Gastroenterology;  Laterality: N/A;  . INSERTION OF MESH N/A 09/08/2016   Procedure: INSERTION OF MESH;  Surgeon: Jackolyn Confer, MD;  [facility];  Service: General;  Laterality: N/A;  . LAPAROSCOPIC SIGMOID COLECTOMY N/A 10/31/2019   Procedure: DIAGNOSTIC LAPAROSCOPY; EXPLORATORY LAPAROTOMY; SIGMOID COLECTOMY;  Surgeon: Erroll Luna, MD;  [facility]: [facility];  Service: General;  Laterality: N/A;  . right rotator cuff    . ROTATOR CUFF REPAIR    . SUBMUCOSAL TATTOO INJECTION  10/29/2019   Procedure: SUBMUCOSAL TATTOO INJECTION;  Surgeon: Jerene Bears, MD;  Location: [facility];  Service: Gastroenterology;;  . TONSILLECTOMY    . UMBILICAL HERNIA REPAIR N/A 09/08/2016   Procedure: OPEN UMBILICAL HERNIA REPAIR WITH MESH;  Surgeon: Jackolyn Confer, MD;  [facility];  Service: General;  Laterality: N/A;  . XI ROBOTIC ASSISTED COLOSTOMY  TAKEDOWN N/A 03/11/2020   Procedure: ROBOTIC ASSISTED COLOSTOMY REVERSAL, RIGID PROCTOSCOPY;  Surgeon: Leighton Ruff, MD;  Location: [facility];  Service: General;  Laterality: N/A;    There were no vitals filed for this visit.   Subjective Assessment - 04/23/20 1320    Subjective Patient reports that he has been walking everyday, walking up to 10-11 minutes. No falls to report.    Pertinent History CAD, HTN, HLD, OSA, BPH, GERD    Limitations Walking    How long can you sit comfortably? no issues    Patient  Stated Goals be able to walk my dogs for 20 minutes, to play some light tennis with grand daughter,    Currently in Pain? No/denies                             [facility] Adult PT Treatment/Exercise - 04/23/20 0001      Ambulation/Gait   Ambulation/Gait Yes    Ambulation/Gait Assistance 6: Modified independent (Device/Increase time)    Assistive device None    Gait Pattern Decreased stance time - right;Decreased stance time - left;Left steppage;Right steppage    Ambulation Surface Level;Indoor    Gait Comments with ambulation, PT providing verbal cues to think about pushing off through the toes to promote PF during gait.       Exercises   Exercises Ankle;Other Exercises    Other Exercises  completed forward lunges to 6" step 2 x 10 reps on BLE. working on pushing off to promote PF strength. Patient reporting stretch with completion as well. CGA as needed.       Ankle Exercises: Stretches   Gastroc Stretch 30 seconds;4 reps;Limitations    Gastroc Stretch Limitations completed standing at countertop with staggered stance; verbal cues for techique. patient reports good stretch with completion.       Ankle Exercises: Standing   Heel Raises Both;10 reps;Limitations    Heel Raises Limitations completed with BUE support, lean with completion but PT providing verbal cues to push through toes with completion.       Ankle Exercises: Seated   Heel Raises Both;10 reps;2 seconds;Limitations    Heel Raises Limitations completed with 4# ankle weights, including 2-3 second hold with completion. increased muscle fatigue reports after completion    Other Seated Ankle Exercises completed seated ankle DF ROM with resisted PF with blue theraband x 10 reps. verbal cues for improved DF with completion.               Balance Exercises - 04/23/20 0001      Balance Exercises: Standing   Tandem Stance Eyes open;Intermittent upper extremity support;3 reps;30 secs;Limitations    Tandem  Stance Time alternating feet position with each repetition; increased difficulty with RLE posterior    Rockerboard Anterior/posterior;EO;EC;Intermittent UE support;Limitations    Rockerboard Limitations completed EO 2 x 1 minute, then progression to eyes closed 3 x 30 seconds. intermittent UE support    Balance Beam standing across red balance beam completed static standing with eyes open 2 x 1 minute. progressed to compelting eyes closed 3 x 30-35 seconds as tolerated by patient.     Tandem Gait Forward;Intermittent upper extremity support;3 reps;Limitations    Tandem Gait Limitations completed at countertop with light UE support as needed               PT Short Term Goals - 04/06/20 1519      PT SHORT TERM GOAL #1  Title Patient will be able to walk for 10 min with cane or no AD to improve walking endurance    Baseline walks around at home for <5 min before getting tired.    Time 4    Period Weeks    Status New    Target Date 05/04/20      PT SHORT TERM GOAL #2   Title Patient will demo 100' improvement on BBS without AD to improve walking endurance    Baseline 1045' without AD    Time 4    Period Weeks    Status New    Target Date 05/04/20      PT SHORT TERM GOAL #3   Title Patient will demo 5 sec of SLS to improve dynamic balance when stepping over obstacles.    Baseline TBD    Time 4    Period Weeks    Status New    Target Date 05/04/20             PT Long Term Goals - 04/06/20 2032      PT LONG TERM GOAL #1   Title Patient will be able to walk on uneven grounds, grassy hill incline and decline for 200' without AD to improve gait on uneven grounds    Baseline pt has not attempted    Time 8    Period Weeks    Status New    Target Date 06/01/20      PT LONG TERM GOAL #2   Title Patient will be able to ambulate 1250 feet or more in 6 minutes to improve walking endurance without AD    Baseline 1045    Time 8    Period Weeks    Status New    Target Date  06/01/20      PT LONG TERM GOAL #3   Title Patient will be able to stand 30 seconds for all positions during modified CTSIB    Baseline TBD    Time 8    Period Weeks    Status New    Target Date 06/01/20                 Plan - 04/23/20 1448    Clinical Impression Statement Today's skilled PT session included continued progresion of activites to promote PF strength and mobility in ankle/toes. Continued balance exercises as tolerated by patient. Increased difficulty with vision removed with balance exercises. Will continue to progress toward all goals.    Personal Factors and Comorbidities Comorbidity 2;Past/Current Experience;Time since onset of injury/illness/exacerbation    Comorbidities CAD, HTN, HLD, OSA, BPH, GERD    Examination-Activity Limitations Stand;Squat    Examination-Participation Restrictions Yard Work;Community Activity;Cleaning    Stability/Clinical Decision Making Stable/Uncomplicated    Rehab Potential Good    PT Frequency 2x / week    PT Duration 8 weeks    PT Treatment/Interventions ADLs/Self Care Home Management;Gait training;Stair training;Functional mobility training;Therapeutic activities;Therapeutic exercise;Balance training;Neuromuscular re-education;Manual techniques;Patient/family education;Passive range of motion;Energy conservation;Joint Manipulations    PT Next Visit Plan continue to work on improving plantarflexor strength, improve stretching of IP and MTP joints, continue balance exercises (incorporating vision removed/complaint surfaces)    PT Home Exercise Plan Access Code: [email or web address]    Consulted and Agree with Plan of Care Patient           Patient will benefit from skilled therapeutic intervention in order to improve the following deficits and impairments:  Abnormal gait, Decreased activity tolerance, Decreased balance, Decreased endurance, Decreased  range of motion, Decreased strength, Impaired flexibility, Impaired tone  Visit  Diagnosis: Unsteadiness on feet  Muscle weakness (generalized)     Problem List Patient Active Problem List   Diagnosis Date Noted  . 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 Partial obstruction of colon ([place])   . Generalized abdominal pain   . Abdominal bloating   . 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 Colonic mass 10/28/2019  . Hypokalemia 10/28/2019  . 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 Non-traumatic rotator cuff tear 09/21/2017  . Syncope and collapse 08/12/2013  . Back pain 08/12/2013  . CAD (coronary artery disease) 08/12/2013  . High blood pressure 03/15/2011  . GERD (gastroesophageal reflux disease) 03/15/2011  . Paronychia of great toe of left foot 03/15/2011    Jones Bales, PT, DPT 04/23/2020, 2:51 PM  [facility] [address] [place], [place], [address] Phone: [phone]   Fax:  [phone]  Name: Wesley [name] MRN: [id number] Date of Birth: [birth year]

## 2020-04-27 ENCOUNTER — Ambulatory Visit: Payer: Medicare Other

## 2020-04-27 ENCOUNTER — Other Ambulatory Visit: Payer: Self-pay

## 2020-04-27 DIAGNOSIS — M6281 Muscle weakness (generalized): Secondary | ICD-10-CM

## 2020-04-27 DIAGNOSIS — R2681 Unsteadiness on feet: Secondary | ICD-10-CM

## 2020-04-27 NOTE — Therapy (Signed)
[facility] [address] [place], [place], [address] Phone: [phone]   Fax:  [phone]  Physical Therapy Treatment  Patient Details  Name: Wesley [name] MRN: [id number] Date of Birth: 1940-12-30 Referring Provider (PT): Dr. Marcello Moores   Encounter Date: 04/27/2020   PT End of Session - 04/27/20 1241    Visit Number 6    Number of Visits 17    Date for PT Re-Evaluation 06/01/20    Authorization - Visit Number 6    Progress Note Due on Visit 10    PT Start Time 2595    PT Stop Time 1230    PT Time Calculation (min) 45 min    Equipment Utilized During Treatment Gait belt    Activity Tolerance Patient tolerated treatment well    Behavior During Therapy [facility] for tasks assessed/performed           Past Medical History:  Diagnosis Date  . 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 CAD (coronary artery disease)   . Cholelithiasis   . Chronic allergic rhinitis   . Coronary atherosclerosis of native coronary artery   . Fatty liver   . GERD (gastroesophageal reflux disease)   . High blood pressure   . Hypercholesterolemia   . Macrocytosis without anemia   . OSA on CPAP   . Paraesophageal hernia   . Partial bowel obstruction ([place])   . 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 Sleep apnea   . Umbilical hernia without obstruction or gangrene     Past Surgical History:  Procedure Laterality Date  . BIOPSY  10/29/2019   Procedure: BIOPSY;  Surgeon: Jerene Bears, MD;  Location: [facility];  Service: Gastroenterology;;  . CARDIAC CATHETERIZATION  2000   Cath to rule out cardiac problems RT HTN. PT denies significant findings  . CARDIAC CATHETERIZATION  2008  . COLOSTOMY N/A 10/31/2019   Procedure: COLOSTOMY;  Surgeon: Erroll Luna, MD;  [facility];  Service: General;  Laterality: N/A;  . CYSTOSCOPY W/ URETERAL STENT PLACEMENT Bilateral 10/31/2019   Procedure:  CYSTOSCOPY URETERAL STENT PLACEMENT BILATERAL;  Surgeon: Franchot Gallo, MD;  [facility];  Service: Urology;  Laterality: Bilateral;  . CYSTOSCOPY WITH STENT PLACEMENT Bilateral 03/11/2020   Procedure: CYSTOSCOPY WITH BILATERAL FIREFLY INJECTION;  Surgeon: Irine Seal, MD;  Location: [facility];  Service: Urology;  Laterality: Bilateral;  . EYE SURGERY     BILATERAL CATARACT SURGERY WITH LENS IMPLANTS  . FLEXIBLE SIGMOIDOSCOPY N/A 10/29/2019   Procedure: FLEXIBLE SIGMOIDOSCOPY;  Surgeon: Jerene Bears, MD;  Location: [facility];  Service: Gastroenterology;  Laterality: N/A;  . INSERTION OF MESH N/A 09/08/2016   Procedure: INSERTION OF MESH;  Surgeon: Jackolyn Confer, MD;  [facility];  Service: General;  Laterality: N/A;  . LAPAROSCOPIC SIGMOID COLECTOMY N/A 10/31/2019   Procedure: DIAGNOSTIC LAPAROSCOPY; EXPLORATORY LAPAROTOMY; SIGMOID COLECTOMY;  Surgeon: Erroll Luna, MD;  [facility]: [facility];  Service: General;  Laterality: N/A;  . right rotator cuff    . ROTATOR CUFF REPAIR    . SUBMUCOSAL TATTOO INJECTION  10/29/2019   Procedure: SUBMUCOSAL TATTOO INJECTION;  Surgeon: Jerene Bears, MD;  [facility];  Service: Gastroenterology;;  . TONSILLECTOMY    . UMBILICAL HERNIA REPAIR N/A 09/08/2016   Procedure: OPEN UMBILICAL HERNIA REPAIR WITH MESH;  Surgeon: Jackolyn Confer, MD;  [facility];  Service: General;  Laterality: N/A;  . XI ROBOTIC ASSISTED COLOSTOMY  TAKEDOWN N/A 03/11/2020   Procedure: ROBOTIC ASSISTED COLOSTOMY REVERSAL, RIGID PROCTOSCOPY;  Surgeon: Leighton Ruff, MD;  [facility];  Service: General;  Laterality: N/A;    There were no vitals filed for this visit.   Subjective Assessment - 04/27/20 1239    Subjective Pt reports he is still walking up to 11 minutes. He feels that his walking has gotten better and he is able to push off from R leg little better without thinking about it. He feels that he is not walking "duck legged" as much.    Pertinent  History CAD, HTN, HLD, OSA, BPH, GERD    Limitations Walking    How long can you sit comfortably? no issues    Patient Stated Goals be able to walk my dogs for 20 minutes, to play some light tennis with grand daughter,                Grade III-IV IP joint mobilizations to  toes 2-3 to improve extension bil feet Grade III-IV MTP joint mobilizations tp tpes 2-3 to improve flexion bil feet Manually stretched IP joints into extension and MTP joints into flexion with prolonged holds Neuro Re-ed:  - isolated MTP/IP joint toe flexion (with manually stabilized foot in 0 deg neutral dorsiflexion): 2 x 20 R and L - Isolated MTP/IP joint toe flexion with plantarflexion (from 0 deg neutral to available range): 2 x 20 R and L Manually resisted ankle plantarflexion and dorsiflexion: slow reversal: 3 x 10 R and L Manually resisted ankle inversion and version: cues to not turn from hip: 3 x 10 R and L Seated heel raises: unilateral: 30lb kettle ball on knee: 3 x 10 R and L Standing facing downhill: cone taps: 20x R and L, needed min A for stabilizing                            PT Short Term Goals - 04/06/20 1519      PT SHORT TERM GOAL #1   Title Patient will be able to walk for 10 min with cane or no AD to improve walking endurance    Baseline walks around at home for <5 min before getting tired.    Time 4    Period Weeks    Status New    Target Date 05/04/20      PT SHORT TERM GOAL #2   Title Patient will demo 100' improvement on BBS without AD to improve walking endurance    Baseline 1045' without AD    Time 4    Period Weeks    Status New    Target Date 05/04/20      PT SHORT TERM GOAL #3   Title Patient will demo 5 sec of SLS to improve dynamic balance when stepping over obstacles.    Baseline TBD    Time 4    Period Weeks    Status New    Target Date 05/04/20             PT Long Term Goals - 04/06/20 2032      PT LONG TERM GOAL #1   Title  Patient will be able to walk on uneven grounds, grassy hill incline and decline for 200' without AD to improve gait on uneven grounds    Baseline pt has not attempted    Time 8    Period Weeks    Status New    Target Date  06/01/20      PT LONG TERM GOAL #2   Title Patient will be able to ambulate 1250 feet or more in 6 minutes to improve walking endurance without AD    Baseline 1045    Time 8    Period Weeks    Status New    Target Date 06/01/20      PT LONG TERM GOAL #3   Title Patient will be able to stand 30 seconds for all positions during modified CTSIB    Baseline TBD    Time 8    Period Weeks    Status New    Target Date 06/01/20                 Plan - 04/27/20 1240    Clinical Impression Statement Today's skilled session was focused on improving forefoot and mid foot mobility bil feet and improving the strength of the toe flexors and plantar flexors bil. Patient demonstrated rapid fagitue with exercises.    Personal Factors and Comorbidities Comorbidity 2;Past/Current Experience;Time since onset of injury/illness/exacerbation    Comorbidities CAD, HTN, HLD, OSA, BPH, GERD    Examination-Activity Limitations Stand;Squat    Examination-Participation Restrictions Yard Work;Community Activity;Cleaning    Stability/Clinical Decision Making Stable/Uncomplicated    Rehab Potential Good    PT Frequency 2x / week    PT Duration 8 weeks    PT Treatment/Interventions ADLs/Self Care Home Management;Gait training;Stair training;Functional mobility training;Therapeutic activities;Therapeutic exercise;Balance training;Neuromuscular re-education;Manual techniques;Patient/family education;Passive range of motion;Energy conservation;Joint Manipulations    PT Next Visit Plan continue to work on improving plantarflexor strength, improve stretching of IP and MTP joints, continue balance exercises (incorporating vision removed/complaint surfaces)    PT Home Exercise Plan Access Code:  [email or web address]    Consulted and Agree with Plan of Care Patient           Patient will benefit from skilled therapeutic intervention in order to improve the following deficits and impairments:  Abnormal gait, Decreased activity tolerance, Decreased balance, Decreased endurance, Decreased range of motion, Decreased strength, Impaired flexibility, Impaired tone  Visit Diagnosis: Unsteadiness on feet  Muscle weakness (generalized)     Problem List Patient Active Problem List   Diagnosis Date Noted  . 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 Partial obstruction of colon ([place])   . Generalized abdominal pain   . Abdominal bloating   . 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 Colonic mass 10/28/2019  . Hypokalemia 10/28/2019  . 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 Non-traumatic rotator cuff tear 09/21/2017  . Syncope and collapse 08/12/2013  . Back pain 08/12/2013  . CAD (coronary artery disease) 08/12/2013  . High blood pressure 03/15/2011  . GERD (gastroesophageal reflux disease) 03/15/2011  . Paronychia of great toe of left foot 03/15/2011    Kerrie Pleasure, PT 04/27/2020, 12:42 PM  [facility] [address] [place], [place], [address] Phone: [phone]   Fax:  [phone]  Name: Wesley [name] MRN: [id number] Date of Birth: 1941-02-06

## 2020-04-30 ENCOUNTER — Other Ambulatory Visit: Payer: Self-pay

## 2020-04-30 ENCOUNTER — Ambulatory Visit: Payer: Medicare Other

## 2020-04-30 DIAGNOSIS — R2681 Unsteadiness on feet: Secondary | ICD-10-CM

## 2020-04-30 DIAGNOSIS — M6281 Muscle weakness (generalized): Secondary | ICD-10-CM | POA: Diagnosis not present

## 2020-04-30 NOTE — Therapy (Signed)
[facility] [address] [place], [place], [address] Phone: [phone]   Fax:  [phone]  Physical Therapy Treatment  Patient Details  Name: Wesley [name] MRN: [id number] Date of Birth: April 09, 1941 Referring Provider (PT): Dr. Sherrilyn Rist Date: 04/30/2020   PT End of Session - 04/30/20 0803    Visit Number 7    Number of Visits 17    Date for PT Re-Evaluation 06/01/20    Authorization - Visit Number 6    Progress Note Due on Visit 10    PT Start Time 0802    PT Stop Time 0843    PT Time Calculation (min) 41 min    Equipment Utilized During Treatment Gait belt    Activity Tolerance Patient tolerated treatment well    Behavior During Therapy [facility] for tasks assessed/performed           Past Medical History:  Diagnosis Date  . 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 CAD (coronary artery disease)   . Cholelithiasis   . Chronic allergic rhinitis   . Coronary atherosclerosis of native coronary artery   . Fatty liver   . GERD (gastroesophageal reflux disease)   . High blood pressure   . Hypercholesterolemia   . Macrocytosis without anemia   . OSA on CPAP   . Paraesophageal hernia   . Partial bowel obstruction ([place])   . 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 Sleep apnea   . Umbilical hernia without obstruction or gangrene     Past Surgical History:  Procedure Laterality Date  . BIOPSY  10/29/2019   Procedure: BIOPSY;  Surgeon: Jerene Bears, MD;  Location: [facility];  Service: Gastroenterology;;  . CARDIAC CATHETERIZATION  2000   Cath to rule out cardiac problems RT HTN. PT denies significant findings  . CARDIAC CATHETERIZATION  2008  . COLOSTOMY N/A 10/31/2019   Procedure: COLOSTOMY;  Surgeon: Erroll Luna, MD;  Location: [facility];  Service: General;  Laterality: N/A;  . CYSTOSCOPY W/ URETERAL STENT PLACEMENT Bilateral 10/31/2019   Procedure:  CYSTOSCOPY URETERAL STENT PLACEMENT BILATERAL;  Surgeon: Franchot Gallo, MD;  [facility]: [facility];  Service: Urology;  Laterality: Bilateral;  . CYSTOSCOPY WITH STENT PLACEMENT Bilateral 03/11/2020   Procedure: CYSTOSCOPY WITH BILATERAL FIREFLY INJECTION;  Surgeon: Irine Seal, MD;  [facility];  Service: Urology;  Laterality: Bilateral;  . EYE SURGERY     BILATERAL CATARACT SURGERY WITH LENS IMPLANTS  . FLEXIBLE SIGMOIDOSCOPY N/A 10/29/2019   Procedure: FLEXIBLE SIGMOIDOSCOPY;  Surgeon: Jerene Bears, MD;  Location: [facility];  Service: Gastroenterology;  Laterality: N/A;  . INSERTION OF MESH N/A 09/08/2016   Procedure: INSERTION OF MESH;  Surgeon: Jackolyn Confer, MD;  [facility];  Service: General;  Laterality: N/A;  . LAPAROSCOPIC SIGMOID COLECTOMY N/A 10/31/2019   Procedure: DIAGNOSTIC LAPAROSCOPY; EXPLORATORY LAPAROTOMY; SIGMOID COLECTOMY;  Surgeon: Erroll Luna, MD;  [facility];  Service: General;  Laterality: N/A;  . right rotator cuff    . ROTATOR CUFF REPAIR    . SUBMUCOSAL TATTOO INJECTION  10/29/2019   Procedure: SUBMUCOSAL TATTOO INJECTION;  Surgeon: Jerene Bears, MD;  [facility];  Service: Gastroenterology;;  . TONSILLECTOMY    . UMBILICAL HERNIA REPAIR N/A 09/08/2016   Procedure: OPEN UMBILICAL HERNIA REPAIR WITH MESH;  Surgeon: Jackolyn Confer, MD;  [facility];  Service: General;  Laterality: N/A;  . XI ROBOTIC ASSISTED COLOSTOMY  TAKEDOWN N/A 03/11/2020   Procedure: ROBOTIC ASSISTED COLOSTOMY REVERSAL, RIGID PROCTOSCOPY;  Surgeon: Leighton Ruff, MD;  [facility];  Service: General;  Laterality: N/A;    There were no vitals filed for this visit.   Subjective Assessment - 04/30/20 0806    Subjective Patient reports that patient continues to walk daily between 10-12 minutes. No other new changes/complaints since last visit. No pain.    Pertinent History CAD, HTN, HLD, OSA, BPH, GERD    Limitations Walking    How long can you sit  comfortably? no issues    Patient Stated Goals be able to walk my dogs for 20 minutes, to play some light tennis with grand daughter,    Currently in Pain? No/denies              [facility] PT Assessment - 04/30/20 0001      6 Minute Walk- Baseline   6 Minute Walk- Baseline yes    BP (mmHg) 122/74    HR (bpm) 73    Modified Borg Scale for Dyspnea 0- Nothing at all      6 Minute walk- Post Test   6 Minute Walk Post Test yes    BP (mmHg) 136/90    HR (bpm) 87    Modified Borg Scale for Dyspnea 0- Nothing at all    Perceived Rate of Exertion (Borg) 13- Somewhat hard      6 minute walk test results    Aerobic Endurance Distance Walked 1120    Endurance additional comments completed without AD, no pain reports after completion. normal vital response                         [facility] Adult PT Treatment/Exercise - 04/30/20 0001      Ambulation/Gait   Ambulation/Gait Yes    Ambulation/Gait Assistance 6: Modified independent (Device/Increase time)    Assistive device None    Gait Pattern Decreased stance time - right;Decreased stance time - left;Left steppage;Right steppage    Ambulation Surface Level;Indoor      Self-Care   Self-Care Other Self-Care Comments    Other Self-Care Comments  Due to patient reporting difficulty with access code, reprinted entire HEP to ensure access to all exercies to promote compliance      Neuro Re-ed    Neuro Re-ed Details  Completed alternating toe taps to pebbles with UE support initially, progressing to completing without UE support x 15 reps with CGA from PT. To further promote SLS include toe tap to pebble with 2-3 second hold x 10 reps, increased difficulty with completion.            Provided handout and reviewed the following exercises with patient:  Access Code: [email or web address] URL: [email or web address] Date: 04/30/2020 Prepared by: Baldomero Lamy  Exercises Seated Ankle Plantarflexion with Resistance - 1 x daily -  7 x weekly - 2 sets - 10 reps Seated Heel Raise - 1 x daily - 7 x weekly - 2 sets - 10 reps Romberg Stance with Eyes Closed - 1 x daily - 7 x weekly - 1 sets - 3 reps - 15-30 seconds hold Romberg Stance on Foam Pad with Head Rotation - 1 x daily - 7 x weekly - 2 sets - 10 reps Romberg Stance with Head Nods on Foam Pad - 1 x daily - 7 x weekly - 2 sets - 10 reps Tandem Stance - 1 x daily - 7 x weekly - 1 sets -  3 reps - 20-30 seconds hold Standing Single Leg Stance with Counter Support - 1 x daily - 7 x weekly - 1 sets - 3 reps - 10-15 seconds hold Standing Toe Taps - 1 x daily - 7 x weekly - 2 sets - 10 reps     Balance Exercises - 04/30/20 0001      Balance Exercises: Standing   SLS Eyes open;Solid surface;Time;Intermittent upper extremity support    SLS Time able to hold 2-3 seconds bilaterally without UE support, increased difficulty with stance on RLE. patient able to hold 20-25 seconds with light UE support bilatteraly.                PT Short Term Goals - 04/30/20 7106      PT SHORT TERM GOAL #1   Title Patient will be able to walk for 10 min with cane or no AD to improve walking endurance    Baseline walks around at home for <5 min before getting tired., patient walking for 10 - 12 mins at home    Time 4    Period Weeks    Status Achieved    Target Date 05/04/20      PT SHORT TERM GOAL #2   Title Patient will demo 100' improvement on BBS without AD to improve walking endurance    Baseline 1045' without AD, 1120' without AD    Time 4    Period Weeks    Status Not Met    Target Date 05/04/20      PT SHORT TERM GOAL #3   Title Patient will demo 5 sec of SLS to improve dynamic balance when stepping over obstacles.    Baseline 2-3 secs    Time 4    Period Weeks    Status Not Met    Target Date 05/04/20             PT Long Term Goals - 04/06/20 2032      PT LONG TERM GOAL #1   Title Patient will be able to walk on uneven grounds, grassy hill incline and  decline for 200' without AD to improve gait on uneven grounds    Baseline pt has not attempted    Time 8    Period Weeks    Status New    Target Date 06/01/20      PT LONG TERM GOAL #2   Title Patient will be able to ambulate 1250 feet or more in 6 minutes to improve walking endurance without AD    Baseline 1045    Time 8    Period Weeks    Status New    Target Date 06/01/20      PT LONG TERM GOAL #3   Title Patient will be able to stand 30 seconds for all positions during modified CTSIB    Baseline TBD    Time 8    Period Weeks    Status New    Target Date 06/01/20                 Plan - 04/30/20 0844    Clinical Impression Statement Today's skilled PT session included assessment of patient's progress toward STG's. Patient able to meet STG #1, with being able to ambulate 10-12 minutes at this time. With 6MWT walk test, patient able to ambulate 1120 ft with progress towrd goals. At this time, patient able to hold SLS 2-3 seconds bilaterally. Continued activites to promote SLS and balance as tolerated  by patient. Will continue to progress toward all LTGs.    Personal Factors and Comorbidities Comorbidity 2;Past/Current Experience;Time since onset of injury/illness/exacerbation    Comorbidities CAD, HTN, HLD, OSA, BPH, GERD    Examination-Activity Limitations Stand;Squat    Examination-Participation Restrictions Yard Work;Community Activity;Cleaning    Stability/Clinical Decision Making Stable/Uncomplicated    Rehab Potential Good    PT Frequency 2x / week    PT Duration 8 weeks    PT Treatment/Interventions ADLs/Self Care Home Management;Gait training;Stair training;Functional mobility training;Therapeutic activities;Therapeutic exercise;Balance training;Neuromuscular re-education;Manual techniques;Patient/family education;Passive range of motion;Energy conservation;Joint Manipulations    PT Next Visit Plan continue to work on improving plantarflexor strength, improve  stretching of IP and MTP joints, continue balance exercises (incorporating vision removed/complaint surfaces)    PT Home Exercise Plan Access Code: [email or web address]    Consulted and Agree with Plan of Care Patient           Patient will benefit from skilled therapeutic intervention in order to improve the following deficits and impairments:  Abnormal gait, Decreased activity tolerance, Decreased balance, Decreased endurance, Decreased range of motion, Decreased strength, Impaired flexibility, Impaired tone  Visit Diagnosis: Muscle weakness (generalized)  Unsteadiness on feet     Problem List Patient Active Problem List   Diagnosis Date Noted  . 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 Partial obstruction of colon ([place])   . Generalized abdominal pain   . Abdominal bloating   . 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 Colonic mass 10/28/2019  . Hypokalemia 10/28/2019  . 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 Non-traumatic rotator cuff tear 09/21/2017  . Syncope and collapse 08/12/2013  . Back pain 08/12/2013  . CAD (coronary artery disease) 08/12/2013  . High blood pressure 03/15/2011  . GERD (gastroesophageal reflux disease) 03/15/2011  . Paronychia of great toe of left foot 03/15/2011    Jones Bales, PT, DPT 04/30/2020, 8:46 AM  [facility] [address] [place], [place], [address] Phone: [phone]   Fax:  [phone]  Name: Wesley [name] MRN: [id number] Date of Birth: Jul 10, 1941

## 2020-05-03 ENCOUNTER — Ambulatory Visit: Payer: Medicare Other

## 2020-05-03 ENCOUNTER — Other Ambulatory Visit: Payer: Self-pay

## 2020-05-03 DIAGNOSIS — M6281 Muscle weakness (generalized): Secondary | ICD-10-CM

## 2020-05-03 DIAGNOSIS — R2681 Unsteadiness on feet: Secondary | ICD-10-CM | POA: Diagnosis not present

## 2020-05-03 NOTE — Patient Instructions (Signed)
Access Code: [email or web address] URL: [email or web address] Date: 05/03/2020 Prepared by: Markus Jarvis  Exercises Seated Toe Flexion Extension PROM - 1 x daily - 7 x weekly - 10 reps - 10 hold Seated Great Toe Extension - 1 x daily - 7 x weekly - 2 sets - 10 reps Seated Lesser Toes Extension - 1 x daily - 7 x weekly - 2 sets - 10 reps Ankle Inversion Eversion Towel Slide - 1 x daily - 7 x weekly - 2 sets - 10 reps Seated Marble Transfer with Toes - 1 x daily - 7 x weekly - 2 sets - 10 reps Towel Scrunches - 1 x daily - 7 x weekly - 2 sets - 10 reps

## 2020-05-03 NOTE — Therapy (Signed)
[facility] [address] [place], [place], [address] Phone: [phone]   Fax:  [phone]  Physical Therapy Treatment  Patient Details  Name: Wesley [name] MRN: [id number] Date of Birth: Aug 23, 1940 Referring Provider (PT): Dr. Marcello Moores   Encounter Date: 05/03/2020   PT End of Session - 05/03/20 1534    Visit Number 8    Number of Visits 17    Date for PT Re-Evaluation 06/01/20    Authorization - Visit Number 7    Progress Note Due on Visit 10    PT Start Time 3329    PT Stop Time 1530    PT Time Calculation (min) 45 min    Equipment Utilized During Treatment Gait belt    Activity Tolerance Patient tolerated treatment well    Behavior During Therapy [facility] for tasks assessed/performed           Past Medical History:  Diagnosis Date   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 symptoms (LUTS)    CAD (coronary artery disease)    Cholelithiasis    Chronic allergic rhinitis    Coronary atherosclerosis of native coronary artery    Fatty liver    GERD (gastroesophageal reflux disease)    High blood pressure    Hypercholesterolemia    Macrocytosis without anemia    OSA on CPAP    Paraesophageal hernia    Partial bowel obstruction ([place])    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 PT denies significant findings   CARDIAC CATHETERIZATION  2008   COLOSTOMY N/A 10/31/2019   Procedure: COLOSTOMY;  Surgeon: Erroll Luna, MD;  [facility]: [facility];  Service: General;  Laterality: N/A;   CYSTOSCOPY W/ URETERAL STENT PLACEMENT Bilateral 10/31/2019   Procedure:  CYSTOSCOPY URETERAL STENT PLACEMENT BILATERAL;  Surgeon: Franchot Gallo, MD;  [facility];  Service: Urology;  Laterality: Bilateral;   CYSTOSCOPY WITH STENT PLACEMENT Bilateral 03/11/2020   Procedure: CYSTOSCOPY WITH BILATERAL FIREFLY INJECTION;  Surgeon: Irine Seal, MD;  [facility];  Service: Urology;  Laterality: Bilateral;   EYE SURGERY     BILATERAL CATARACT SURGERY WITH LENS IMPLANTS   FLEXIBLE SIGMOIDOSCOPY N/A 10/29/2019   Procedure: FLEXIBLE SIGMOIDOSCOPY;  Surgeon: Jerene Bears, MD;  [facility]: [facility];  Service: Gastroenterology;  Laterality: N/A;   INSERTION OF MESH N/A 09/08/2016   Procedure: INSERTION OF MESH;  Surgeon: Jackolyn Confer, MD;  [facility]: [facility];  Service: General;  Laterality: N/A;   LAPAROSCOPIC SIGMOID COLECTOMY N/A 10/31/2019   Procedure: DIAGNOSTIC LAPAROSCOPY; EXPLORATORY LAPAROTOMY; SIGMOID COLECTOMY;  Surgeon: Erroll Luna, MD;  [facility];  Service: General;  Laterality: N/A;   right rotator cuff     [place] INJECTION  10/29/2019   Procedure: SUBMUCOSAL TATTOO INJECTION;  Surgeon: Jerene Bears, MD;  [facility];  Service: Gastroenterology;;   TONSILLECTOMY     UMBILICAL HERNIA REPAIR N/A 09/08/2016   Procedure: OPEN UMBILICAL HERNIA REPAIR WITH MESH;  Surgeon: Jackolyn Confer, MD;  [facility];  Service: General;  Laterality: N/A;   XI ROBOTIC ASSISTED COLOSTOMY  TAKEDOWN N/A 03/11/2020   Procedure: ROBOTIC ASSISTED COLOSTOMY REVERSAL, RIGID PROCTOSCOPY;  Surgeon: Leighton Ruff, MD;  [facility];  Service: General;  Laterality: N/A;    There were no vitals filed for this visit.   Subjective Assessment - 05/03/20 1503    Subjective Patient reports that patient continues to walk daily between 10-12 minutes. No other new changes/complaints since last visit. No pain.    Pertinent History CAD, HTN, HLD, OSA, BPH, GERD    Limitations Walking    How long can you sit  comfortably? no issues    Patient Stated Goals be able to walk my dogs for 20 minutes, to play some light tennis with grand daughter,             Seated toe scrunches: 20x bil Seated big toe extensions: 20x bil Seated lesser toe extensions: 20x bil Seated marble pick up and trasnfer: R LE 10 marbles Seated tissue paper pick up and trasnfer: L LE: 10 tissue scrunches Seated ankle inversion and eversion slides   Manually stretched distal toe flexors and extensors with light joint mobs at MTP and IP joints of lesser toes                       PT Short Term Goals - 04/30/20 0623      PT SHORT TERM GOAL #1   Title Patient will be able to walk for 10 min with cane or no AD to improve walking endurance    Baseline walks around at home for <5 min before getting tired., patient walking for 10 - 12 mins at home    Time 4    Period Weeks    Status Achieved    Target Date 05/04/20      PT SHORT TERM GOAL #2   Title Patient will demo 100' improvement on BBS without AD to improve walking endurance    Baseline 1045' without AD, 1120' without AD    Time 4    Period Weeks    Status Not Met    Target Date 05/04/20      PT SHORT TERM GOAL #3   Title Patient will demo 5 sec of SLS to improve dynamic balance when stepping over obstacles.    Baseline 2-3 secs    Time 4    Period Weeks    Status Not Met    Target Date 05/04/20             PT Long Term Goals - 04/06/20 2032      PT LONG TERM GOAL #1   Title Patient will be able to walk on uneven grounds, grassy hill incline and decline for 200' without AD to improve gait on uneven grounds    Baseline pt has not attempted    Time 8    Period Weeks    Status New    Target Date 06/01/20      PT LONG TERM GOAL #2   Title Patient will be able to ambulate 1250 feet or more in 6 minutes to improve walking endurance without AD    Baseline 1045    Time 8    Period Weeks    Status New    Target Date 06/01/20       PT LONG TERM GOAL #3   Title Patient will be able to stand 30 seconds for all positions during modified CTSIB    Baseline TBD    Time 8  Period Weeks    Status New    Target Date 06/01/20                 Plan - 05/03/20 1541    Clinical Impression Statement Pt demonstrates decresed ability to extend IP joints and MTP joints of L foot > R foot. Patient able to pick up marbles with R foot but unable to pick up with L foot. Patient had signficaint difficulty with picking up tissue paper with L foot as well. Patient was given updated HEP to improve intrinsic foot control and ankle control in bil feet/ankle.    Personal Factors and Comorbidities Comorbidity 2;Past/Current Experience;Time since onset of injury/illness/exacerbation    Comorbidities CAD, HTN, HLD, OSA, BPH, GERD    Examination-Activity Limitations Stand;Squat    Examination-Participation Restrictions Yard Work;Community Activity;Cleaning    Stability/Clinical Decision Making Stable/Uncomplicated    Rehab Potential Good    PT Frequency 2x / week    PT Duration 8 weeks    PT Treatment/Interventions ADLs/Self Care Home Management;Gait training;Stair training;Functional mobility training;Therapeutic activities;Therapeutic exercise;Balance training;Neuromuscular re-education;Manual techniques;Patient/family education;Passive range of motion;Energy conservation;Joint Manipulations    PT Next Visit Plan continue to work on improving plantarflexor strength, improve stretching of IP and MTP joints, continue balance exercises (incorporating vision removed/complaint surfaces)    PT Home Exercise Plan Access Code: [email or web address]    Consulted and Agree with Plan of Care Patient           Patient will benefit from skilled therapeutic intervention in order to improve the following deficits and impairments:  Abnormal gait, Decreased activity tolerance, Decreased balance, Decreased endurance, Decreased range of motion, Decreased strength,  Impaired flexibility, Impaired tone  Visit Diagnosis: Muscle weakness (generalized)     Problem List Patient Active Problem List   Diagnosis Date Noted   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   Partial obstruction of colon ([facility])    Generalized abdominal pain    Abdominal bloating    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 [place], [place], [address] Phone: [phone]   Fax:  [phone]  Name: MEER REINDL MRN: [id number] Date of Birth: 30-Apr-1941

## 2020-05-04 ENCOUNTER — Ambulatory Visit: Payer: Medicare Other

## 2020-05-06 ENCOUNTER — Other Ambulatory Visit: Payer: Self-pay

## 2020-05-06 ENCOUNTER — Ambulatory Visit: Payer: Medicare Other

## 2020-05-06 DIAGNOSIS — R2681 Unsteadiness on feet: Secondary | ICD-10-CM

## 2020-05-06 DIAGNOSIS — M6281 Muscle weakness (generalized): Secondary | ICD-10-CM

## 2020-05-06 NOTE — Therapy (Signed)
[facility] [address] [place], [place], [address] Phone: [phone]   Fax:  [phone]  Physical Therapy Treatment  Patient Details  Name: Wesley [name] MRN: [id number] Date of Birth: June 23, 1941 Referring Provider (PT): Dr. Sherrilyn Rist Date: 05/06/2020   PT End of Session - 05/06/20 1621    Visit Number 9    Number of Visits 17    Date for PT Re-Evaluation 06/01/20    Authorization - Visit Number 9    Progress Note Due on Visit 10    PT Start Time 2751    PT Stop Time 1530    PT Time Calculation (min) 45 min    Equipment Utilized During Treatment Gait belt    Activity Tolerance Patient tolerated treatment well    Behavior During Therapy [facility] for tasks assessed/performed           Past Medical History:  Diagnosis Date  . 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 CAD (coronary artery disease)   . Cholelithiasis   . Chronic allergic rhinitis   . Coronary atherosclerosis of native coronary artery   . Fatty liver   . GERD (gastroesophageal reflux disease)   . High blood pressure   . Hypercholesterolemia   . Macrocytosis without anemia   . OSA on CPAP   . Paraesophageal hernia   . Partial bowel obstruction ([place])   . 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 Sleep apnea   . Umbilical hernia without obstruction or gangrene     Past Surgical History:  Procedure Laterality Date  . BIOPSY  10/29/2019   Procedure: BIOPSY;  Surgeon: Jerene Bears, MD;  Location: [facility];  Service: Gastroenterology;;  . CARDIAC CATHETERIZATION  2000   Cath to rule out cardiac problems RT HTN. PT denies significant findings  . CARDIAC CATHETERIZATION  2008  . COLOSTOMY N/A 10/31/2019   Procedure: COLOSTOMY;  Surgeon: Erroll Luna, MD;  Location: [facility];  Service: General;  Laterality: N/A;  . CYSTOSCOPY W/ URETERAL STENT PLACEMENT Bilateral 10/31/2019   Procedure:  CYSTOSCOPY URETERAL STENT PLACEMENT BILATERAL;  Surgeon: Franchot Gallo, MD;  Location: [facility];  Service: Urology;  Laterality: Bilateral;  . CYSTOSCOPY WITH STENT PLACEMENT Bilateral 03/11/2020   Procedure: CYSTOSCOPY WITH BILATERAL FIREFLY INJECTION;  Surgeon: Irine Seal, MD;  [facility]: [facility];  Service: Urology;  Laterality: Bilateral;  . EYE SURGERY     BILATERAL CATARACT SURGERY WITH LENS IMPLANTS  . FLEXIBLE SIGMOIDOSCOPY N/A 10/29/2019   Procedure: FLEXIBLE SIGMOIDOSCOPY;  Surgeon: Jerene Bears, MD;  Location: [facility];  Service: Gastroenterology;  Laterality: N/A;  . INSERTION OF MESH N/A 09/08/2016   Procedure: INSERTION OF MESH;  Surgeon: Jackolyn Confer, MD;  Location: [facility];  Service: General;  Laterality: N/A;  . LAPAROSCOPIC SIGMOID COLECTOMY N/A 10/31/2019   Procedure: DIAGNOSTIC LAPAROSCOPY; EXPLORATORY LAPAROTOMY; SIGMOID COLECTOMY;  Surgeon: Erroll Luna, MD;  Location: [facility];  Service: General;  Laterality: N/A;  . right rotator cuff    . ROTATOR CUFF REPAIR    . SUBMUCOSAL TATTOO INJECTION  10/29/2019   Procedure: SUBMUCOSAL TATTOO INJECTION;  Surgeon: Jerene Bears, MD;  Location: [facility];  Service: Gastroenterology;;  . TONSILLECTOMY    . UMBILICAL HERNIA REPAIR N/A 09/08/2016   Procedure: OPEN UMBILICAL HERNIA REPAIR WITH MESH;  Surgeon: Jackolyn Confer, MD;  Location: [facility];  Service: General;  Laterality: N/A;  . XI ROBOTIC ASSISTED COLOSTOMY  TAKEDOWN N/A 03/11/2020   Procedure: ROBOTIC ASSISTED COLOSTOMY REVERSAL, RIGID PROCTOSCOPY;  Surgeon: Leighton Ruff, MD;  Location: [facility];  Service: General;  Laterality: N/A;    There were no vitals filed for this visit.   Subjective Assessment - 05/06/20 1443    Subjective Patient reports that patient continues to walk daily between 10-12 minutes. No other new changes/complaints since last visit. No pain.    Pertinent History CAD, HTN, HLD, OSA, BPH, GERD    Limitations Walking    How long can you sit  comfortably? no issues    Patient Stated Goals be able to walk my dogs for 20 minutes, to play some light tennis with grand daughter,             Made a foot splint for patient to help stretch IP joints and MTP joints in neutral position. Pt advised to wear this splint for 2-4 hours while he is awake Pt advised not to walk while wearing splint. Pt advised that if he can tolerate wearing splint for 4 hours without any adverse redness or skin break down, he can wear it at night for polonged stretching    Manually stretched distal toe flexors and extensors with light joint mobs at MTP and IP joints of lesser toes   Biodex harness: 105lb unweighed: 3 x 10 heel raises                          PT Short Term Goals - 04/30/20 5366      PT SHORT TERM GOAL #1   Title Patient will be able to walk for 10 min with cane or no AD to improve walking endurance    Baseline walks around at home for <5 min before getting tired., patient walking for 10 - 12 mins at home    Time 4    Period Weeks    Status Achieved    Target Date 05/04/20      PT SHORT TERM GOAL #2   Title Patient will demo 100' improvement on BBS without AD to improve walking endurance    Baseline 1045' without AD, 1120' without AD    Time 4    Period Weeks    Status Not Met    Target Date 05/04/20      PT SHORT TERM GOAL #3   Title Patient will demo 5 sec of SLS to improve dynamic balance when stepping over obstacles.    Baseline 2-3 secs    Time 4    Period Weeks    Status Not Met    Target Date 05/04/20             PT Long Term Goals - 04/06/20 2032      PT LONG TERM GOAL #1   Title Patient will be able to walk on uneven grounds, grassy hill incline and decline for 200' without AD to improve gait on uneven grounds    Baseline pt has not attempted    Time 8    Period Weeks    Status New    Target Date 06/01/20      PT LONG TERM GOAL #2   Title Patient will be able to ambulate 1250 feet  or more in 6 minutes to improve walking endurance without AD    Baseline 1045    Time 8    Period Weeks    Status New    Target Date 06/01/20  PT LONG TERM GOAL #3   Title Patient will be able to stand 30 seconds for all positions during modified CTSIB    Baseline TBD    Time 8    Period Weeks    Status New    Target Date 06/01/20                 Plan - 05/06/20 1446    Clinical Impression Statement Pt is still unable to perform heel raises in standing position. We tried in biodex unweighted harness with 105lb off, pt was able to perform heel raises with weight unloaded off his body. Patient was educated to perform this in chest deep water and then progressively reduce buyoncy when he joints [employer]. Pt was given a splint to keep the L toes IP joints and MTP joints in neutral position for prolonged stretching. Patient was educated on wearing it while he is awawke to make sure there is no redness or skin irritation and when he can tolerate it for 2-3 hours without skin irritation, he can wear it at night.    Personal Factors and Comorbidities Comorbidity 2;Past/Current Experience;Time since onset of injury/illness/exacerbation    Comorbidities CAD, HTN, HLD, OSA, BPH, GERD    Examination-Activity Limitations Stand;Squat    Examination-Participation Restrictions Yard Work;Community Activity;Cleaning    Stability/Clinical Decision Making Stable/Uncomplicated    Rehab Potential Good    PT Frequency 2x / week    PT Duration 8 weeks    PT Treatment/Interventions ADLs/Self Care Home Management;Gait training;Stair training;Functional mobility training;Therapeutic activities;Therapeutic exercise;Balance training;Neuromuscular re-education;Manual techniques;Patient/family education;Passive range of motion;Energy conservation;Joint Manipulations    PT Next Visit Plan continue to work on improving plantarflexor strength, improve stretching of IP and MTP joints, continue balance exercises  (incorporating vision removed/complaint surfaces)    PT Home Exercise Plan Access Code: [email or web address]    Consulted and Agree with Plan of Care Patient           Patient will benefit from skilled therapeutic intervention in order to improve the following deficits and impairments:  Abnormal gait, Decreased activity tolerance, Decreased balance, Decreased endurance, Decreased range of motion, Decreased strength, Impaired flexibility, Impaired tone  Visit Diagnosis: Muscle weakness (generalized)  Unsteadiness on feet     Problem List Patient Active Problem List   Diagnosis Date Noted  . 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 Partial obstruction of colon ([place])   . Generalized abdominal pain   . Abdominal bloating   . 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 Colonic mass 10/28/2019  . Hypokalemia 10/28/2019  . 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 Non-traumatic rotator cuff tear 09/21/2017  . Syncope and collapse 08/12/2013  . Back pain 08/12/2013  . CAD (coronary artery disease) 08/12/2013  . High blood pressure 03/15/2011  . GERD (gastroesophageal reflux disease) 03/15/2011  . Paronychia of great toe of left foot 03/15/2011    Kerrie Pleasure, PT 05/06/2020, 4:22 PM  [facility] [address] [place], [place], [address] Phone: [phone]   Fax:  [phone]  Name: Wesley [name] MRN: [id number] Date of Birth: [birth year]

## 2020-05-10 ENCOUNTER — Other Ambulatory Visit: Payer: Self-pay

## 2020-05-10 ENCOUNTER — Ambulatory Visit: Payer: Medicare Other

## 2020-05-10 DIAGNOSIS — M6281 Muscle weakness (generalized): Secondary | ICD-10-CM

## 2020-05-10 DIAGNOSIS — R2681 Unsteadiness on feet: Secondary | ICD-10-CM

## 2020-05-10 NOTE — Therapy (Signed)
[facility] [address] [place], [place], [address] Phone: [phone]   Fax:  [phone]  Physical Therapy Progress Note  Patient Details  Name: Wesley [name] MRN: [id number] Date of Birth: 11-10-1940 Referring Provider (PT): Dr. Sherrilyn Rist Date: 05/10/2020   PT End of Session - 05/10/20 1829    Visit Number 10    Number of Visits 17    Date for PT Re-Evaluation 06/01/20    Authorization - Visit Number 10    Progress Note Due on Visit 10    PT Start Time 2957    PT Stop Time 1830    PT Time Calculation (min) 45 min    Equipment Utilized During Treatment Gait belt    Activity Tolerance Patient tolerated treatment well    Behavior During Therapy [facility] for tasks assessed/performed           Past Medical History:  Diagnosis Date  . 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 CAD (coronary artery disease)   . Cholelithiasis   . Chronic allergic rhinitis   . Coronary atherosclerosis of native coronary artery   . Fatty liver   . GERD (gastroesophageal reflux disease)   . High blood pressure   . Hypercholesterolemia   . Macrocytosis without anemia   . OSA on CPAP   . Paraesophageal hernia   . Partial bowel obstruction ([place])   . 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 Sleep apnea   . Umbilical hernia without obstruction or gangrene     Past Surgical History:  Procedure Laterality Date  . BIOPSY  10/29/2019   Procedure: BIOPSY;  Surgeon: Jerene Bears, MD;  Location: [facility];  Service: Gastroenterology;;  . CARDIAC CATHETERIZATION  2000   Cath to rule out cardiac problems RT HTN. PT denies significant findings  . CARDIAC CATHETERIZATION  2008  . COLOSTOMY N/A 10/31/2019   Procedure: COLOSTOMY;  Surgeon: Erroll Luna, MD;  [facility];  Service: General;  Laterality: N/A;  . CYSTOSCOPY W/ URETERAL STENT PLACEMENT Bilateral 10/31/2019    Procedure: CYSTOSCOPY URETERAL STENT PLACEMENT BILATERAL;  Surgeon: Franchot Gallo, MD;  [facility];  Service: Urology;  Laterality: Bilateral;  . CYSTOSCOPY WITH STENT PLACEMENT Bilateral 03/11/2020   Procedure: CYSTOSCOPY WITH BILATERAL FIREFLY INJECTION;  Surgeon: Irine Seal, MD;  Location: [facility];  Service: Urology;  Laterality: Bilateral;  . EYE SURGERY     BILATERAL CATARACT SURGERY WITH LENS IMPLANTS  . FLEXIBLE SIGMOIDOSCOPY N/A 10/29/2019   Procedure: FLEXIBLE SIGMOIDOSCOPY;  Surgeon: Jerene Bears, MD;  Location: [facility];  Service: Gastroenterology;  Laterality: N/A;  . INSERTION OF MESH N/A 09/08/2016   Procedure: INSERTION OF MESH;  Surgeon: Jackolyn Confer, MD;  Location[facility];  Service: General;  Laterality: N/A;  . LAPAROSCOPIC SIGMOID COLECTOMY N/A 10/31/2019   Procedure: DIAGNOSTIC LAPAROSCOPY; EXPLORATORY LAPAROTOMY; SIGMOID COLECTOMY;  Surgeon: Erroll Luna, MD;  Location: [facility];  Service: General;  Laterality: N/A;  . right rotator cuff    . ROTATOR CUFF REPAIR    . SUBMUCOSAL TATTOO INJECTION  10/29/2019   Procedure: SUBMUCOSAL TATTOO INJECTION;  Surgeon: Jerene Bears, MD;  [facility];  Service: Gastroenterology;;  . TONSILLECTOMY    . UMBILICAL HERNIA REPAIR N/A 09/08/2016   Procedure: OPEN UMBILICAL HERNIA REPAIR WITH MESH;  Surgeon: Jackolyn Confer, MD;  [facility];  Service: General;  Laterality: N/A;  . XI ROBOTIC ASSISTED  COLOSTOMY TAKEDOWN N/A 03/11/2020   Procedure: ROBOTIC ASSISTED COLOSTOMY REVERSAL, RIGID PROCTOSCOPY;  Surgeon: Leighton Ruff, MD;  [facility]: [facility];  Service: General;  Laterality: N/A;    There were no vitals filed for this visit.   Subjective Assessment - 05/10/20 1744    Subjective I was able to pick up 10 marbles with my Left foot for the first time this morning. My R side of back has been sore and that is why I am limping a little.    Pertinent History CAD, HTN, HLD, OSA, BPH, GERD    Limitations Walking     How long can you sit comfortably? no issues    Patient Stated Goals be able to walk my dogs for 20 minutes, to play some light tennis with grand daughter,    Currently in Pain? Yes    Pain Score 4     Pain Location Back    Pain Orientation Right    Pain Descriptors / Indicators Aching             Manually stretched bil toe flexors and extensors Grade IV long axis traction mobilization of each toe Grade IV MTP joint flexion mobilization each toe bil LE  Knee to chest: 10 x 10" holds Biodex body weight supported harness: 90 lb unweighed: 5 x 10 heel raises. Manually resisted seated heel raises: 3 x 10 R and L Tandem walking with 1 HHA: 2 x 20 feet                         PT Short Term Goals - 04/30/20 5573      PT SHORT TERM GOAL #1   Title Patient will be able to walk for 10 min with cane or no AD to improve walking endurance    Baseline walks around at home for <5 min before getting tired., patient walking for 10 - 12 mins at home    Time 4    Period Weeks    Status Achieved    Target Date 05/04/20      PT SHORT TERM GOAL #2   Title Patient will demo 100' improvement on BBS without AD to improve walking endurance    Baseline 1045' without AD, 1120' without AD    Time 4    Period Weeks    Status Not Met    Target Date 05/04/20      PT SHORT TERM GOAL #3   Title Patient will demo 5 sec of SLS to improve dynamic balance when stepping over obstacles.    Baseline 2-3 secs    Time 4    Period Weeks    Status Not Met    Target Date 05/04/20             PT Long Term Goals - 04/06/20 2032      PT LONG TERM GOAL #1   Title Patient will be able to walk on uneven grounds, grassy hill incline and decline for 200' without AD to improve gait on uneven grounds    Baseline pt has not attempted    Time 8    Period Weeks    Status New    Target Date 06/01/20      PT LONG TERM GOAL #2   Title Patient will be able to ambulate 1250 feet or more in  6 minutes to improve walking endurance without AD    Baseline 1045    Time 8  Period Weeks    Status New    Target Date 06/01/20      PT LONG TERM GOAL #3   Title Patient will be able to stand 30 seconds for all positions during modified CTSIB    Baseline TBD    Time 8    Period Weeks    Status New    Target Date 06/01/20                 Plan - 05/10/20 1829    Clinical Impression Statement Patient has been seen for total of 10 sessions for decreased gait and balance from 04/06/20 to 05/10/20. Patient has significant weakness in bil plantarflexors and foot intrinsics which is affecting push off during gait and also balance. Patient is starting to demonstrate improving foot intrinsics muscle control with marble pick up exercises. Patient is not able to perfrom bil heel raises yet due to bil plantar flexor weakness. Patient has met 1/3 short term goals and making progress towards long term goals. Patient will benefit from continued skilled PT to work towards short term and long term goals.    Personal Factors and Comorbidities Comorbidity 2;Past/Current Experience;Time since onset of injury/illness/exacerbation    Comorbidities CAD, HTN, HLD, OSA, BPH, GERD    Examination-Activity Limitations Stand;Squat    Examination-Participation Restrictions Yard Work;Community Activity;Cleaning    Stability/Clinical Decision Making Stable/Uncomplicated    Rehab Potential Good    PT Frequency 2x / week    PT Duration 6 weeks    PT Treatment/Interventions ADLs/Self Care Home Management;Gait training;Stair training;Functional mobility training;Therapeutic activities;Therapeutic exercise;Balance training;Neuromuscular re-education;Manual techniques;Patient/family education;Passive range of motion;Energy conservation;Joint Manipulations    PT Next Visit Plan assess [id number]    Consulted and Agree with Plan of Care Patient           Patient will benefit from skilled therapeutic intervention in  order to improve the following deficits and impairments:  Abnormal gait, Decreased activity tolerance, Decreased balance, Decreased endurance, Decreased range of motion, Decreased strength, Impaired flexibility, Impaired tone  Visit Diagnosis: Muscle weakness (generalized)  Unsteadiness on feet     Problem List Patient Active Problem List   Diagnosis Date Noted  . 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 Partial obstruction of colon ([place])   . Generalized abdominal pain   . Abdominal bloating   . 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 Colonic mass 10/28/2019  . Hypokalemia 10/28/2019  . 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 Non-traumatic rotator cuff tear 09/21/2017  . Syncope and collapse 08/12/2013  . Back pain 08/12/2013  . CAD (coronary artery disease) 08/12/2013  . High blood pressure 03/15/2011  . GERD (gastroesophageal reflux disease) 03/15/2011  . Paronychia of great toe of left foot 03/15/2011    Kerrie Pleasure , PT 05/10/2020, 6:37 PM  [facility] [address] [place], [place], [address] Phone: [phone]   Fax:  [phone]  Name: Wesley [name] MRN: [id number] Date of Birth: 18-Sep-1940

## 2020-05-11 ENCOUNTER — Ambulatory Visit: Payer: Medicare Other

## 2020-05-13 ENCOUNTER — Other Ambulatory Visit: Payer: Self-pay

## 2020-05-13 ENCOUNTER — Ambulatory Visit: Payer: Medicare Other

## 2020-05-13 DIAGNOSIS — R2681 Unsteadiness on feet: Secondary | ICD-10-CM

## 2020-05-13 DIAGNOSIS — M6281 Muscle weakness (generalized): Secondary | ICD-10-CM

## 2020-05-13 DIAGNOSIS — Z23 Encounter for immunization: Secondary | ICD-10-CM | POA: Diagnosis not present

## 2020-05-13 NOTE — Therapy (Signed)
[facility] [address] [place], [place], [address] Phone: [phone]   Fax:  [phone]  Physical Therapy Treatment  Patient Details  Name: Wesley [name] MRN: [id number] Date of Birth: 09-26-1940 Referring Provider (PT): Dr. Sherrilyn Rist Date: 05/13/2020   PT End of Session - 05/13/20 1559    Visit Number 11    Number of Visits 17    Date for PT Re-Evaluation 06/01/20    Authorization - Visit Number 10    Progress Note Due on Visit 17    PT Start Time 1400    PT Stop Time 1445    PT Time Calculation (min) 45 min    Equipment Utilized During Treatment Gait belt    Activity Tolerance Patient tolerated treatment well    Behavior During Therapy [facility] for tasks assessed/performed           Past Medical History:  Diagnosis Date  . 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 CAD (coronary artery disease)   . Cholelithiasis   . Chronic allergic rhinitis   . Coronary atherosclerosis of native coronary artery   . Fatty liver   . GERD (gastroesophageal reflux disease)   . High blood pressure   . Hypercholesterolemia   . Macrocytosis without anemia   . OSA on CPAP   . Paraesophageal hernia   . Partial bowel obstruction ([place])   . 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 Sleep apnea   . Umbilical hernia without obstruction or gangrene     Past Surgical History:  Procedure Laterality Date  . BIOPSY  10/29/2019   Procedure: BIOPSY;  Surgeon: Jerene Bears, MD;  Location: [facility];  Service: Gastroenterology;;  . CARDIAC CATHETERIZATION  2000   Cath to rule out cardiac problems RT HTN. PT denies significant findings  . CARDIAC CATHETERIZATION  2008  . COLOSTOMY N/A 10/31/2019   Procedure: COLOSTOMY;  Surgeon: Erroll Luna, MD;  Location: [facility];  Service: General;  Laterality: N/A;  . CYSTOSCOPY W/ URETERAL STENT PLACEMENT Bilateral 10/31/2019   Procedure:  CYSTOSCOPY URETERAL STENT PLACEMENT BILATERAL;  Surgeon: Franchot [place]o, MD;  Location: [facility];  Service: Urology;  Laterality: Bilateral;  . CYSTOSCOPY WITH STENT PLACEMENT Bilateral 03/11/2020   Procedure: CYSTOSCOPY WITH BILATERAL FIREFLY INJECTION;  Surgeon: Irine Seal, MD;  Location: [facility];  Service: Urology;  Laterality: Bilateral;  . EYE SURGERY     BILATERAL CATARACT SURGERY WITH LENS IMPLANTS  . FLEXIBLE SIGMOIDOSCOPY N/A 10/29/2019   Procedure: FLEXIBLE SIGMOIDOSCOPY;  Surgeon: Jerene Bears, MD;  Location: [facility];  Service: Gastroenterology;  Laterality: N/A;  . INSERTION OF MESH N/A 09/08/2016   Procedure: INSERTION OF MESH;  Surgeon: Jackolyn Confer, MD;  Location: [facility];  Service: General;  Laterality: N/A;  . LAPAROSCOPIC SIGMOID COLECTOMY N/A 10/31/2019   Procedure: DIAGNOSTIC LAPAROSCOPY; EXPLORATORY LAPAROTOMY; SIGMOID COLECTOMY;  Surgeon: Erroll Luna, MD;  Location: [facility];  Service: General;  Laterality: N/A;  . right rotator cuff    . ROTATOR CUFF REPAIR    . SUBMUCOSAL TATTOO INJECTION  10/29/2019   Procedure: SUBMUCOSAL TATTOO INJECTION;  Surgeon: Jerene Bears, MD;  Location: [facility];  Service: Gastroenterology;;  . TONSILLECTOMY    . UMBILICAL HERNIA REPAIR N/A 09/08/2016   Procedure: OPEN UMBILICAL HERNIA REPAIR WITH MESH;  Surgeon: Jackolyn Confer, MD;  Location: [facility];  Service: General;  Laterality: N/A;  . XI ROBOTIC ASSISTED COLOSTOMY  TAKEDOWN N/A 03/11/2020   Procedure: ROBOTIC ASSISTED COLOSTOMY REVERSAL, RIGID PROCTOSCOPY;  Surgeon: Leighton Ruff, MD;  Location: [facility];  Service: General;  Laterality: N/A;    There were no vitals filed for this visit.   Subjective Assessment - 05/13/20 1445    Subjective Pt reports he is doing okay. He is compliant with HEP    Pertinent History CAD, HTN, HLD, OSA, BPH, GERD    Limitations Walking    How long can you sit comfortably? no issues    Patient Stated Goals be able to walk my dogs for 20  minutes, to play some light tennis with grand daughter,               Elbows on countertop: heel raises:3 x 10 Biodex unweighted harness: 65lbs off: 6 x 5 bil  Farmer's carry: 2 x 115' with 30lb KB, 2 x 115' with 20lb KB (R and L Hand each) Sit to stand: 30lbs KB: 10x                         PT Short Term Goals - 05/10/20 1839      PT SHORT TERM GOAL #1   Title Patient will be able to walk for 10 min with cane or no AD to improve walking endurance    Baseline walks around at home for <5 min before getting tired., patient walking for 10 - 12 mins at home    Time 4    Period Weeks    Status Achieved    Target Date 05/04/20      PT SHORT TERM GOAL #2   Title Patient will be able to perform 5x bil heel raises with 50lbs unweighted with biodex harness system to improve strength in bil plantarflexors    Baseline Currently 90-100lbs unweighted for pt to perform bil heel raises    Time 4    Period Weeks    Status Not Met    Target Date 06/07/20      PT SHORT TERM GOAL #3   Title Patient will demo 5 sec of SLS to improve dynamic balance when stepping over obstacles.    Baseline 2-3 secs    Time 4    Period Weeks    Status Revised    Target Date 06/07/20             PT Long Term Goals - 05/10/20 1840      PT LONG TERM GOAL #1   Title Patient will be able to walk on uneven grounds, grassy hill incline and decline for 200' without AD to improve gait on uneven grounds    Baseline pt has not attempted    Time 6    Period Weeks    Status New    Target Date 06/21/20      PT LONG TERM GOAL #2   Title Patient will be able to ambulate 1250 feet or more in 6 minutes to improve walking endurance without AD    Baseline 1045    Time 6    Period Weeks    Status New    Target Date 06/21/20      PT LONG TERM GOAL #3   Title Patient will be able to stand 30 seconds for all positions during modified CTSIB    Baseline TBD    Time 6    Period Weeks     Status New    Target Date 06/21/20  Plan - 05/13/20 1445    Clinical Impression Statement Pt able to perform heel raises with elbow support on countetop for the first time today. Pt able to perform heel raises with biodex harness with only 65lb unweighted.    Personal Factors and Comorbidities Comorbidity 2;Past/Current Experience;Time since onset of injury/illness/exacerbation    Comorbidities CAD, HTN, HLD, OSA, BPH, GERD    Examination-Activity Limitations Stand;Squat    Examination-Participation Restrictions Yard Work;Community Activity;Cleaning    Stability/Clinical Decision Making Stable/Uncomplicated    Rehab Potential Good    PT Frequency 2x / week    PT Duration 6 weeks    PT Treatment/Interventions ADLs/Self Care Home Management;Gait training;Stair training;Functional mobility training;Therapeutic activities;Therapeutic exercise;Balance training;Neuromuscular re-education;Manual techniques;Patient/family education;Passive range of motion;Energy conservation;Joint Manipulations    PT Next Visit Plan assess 6MWT    Consulted and Agree with Plan of Care Patient           Patient will benefit from skilled therapeutic intervention in order to improve the following deficits and impairments:  Abnormal gait, Decreased activity tolerance, Decreased balance, Decreased endurance, Decreased range of motion, Decreased strength, Impaired flexibility, Impaired tone  Visit Diagnosis: Muscle weakness (generalized)  Unsteadiness on feet     Problem List Patient Active Problem List   Diagnosis Date Noted  . 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 Partial obstruction of colon ([place])   . Generalized abdominal pain   . Abdominal bloating   . 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 Colonic mass 10/28/2019  . Hypokalemia 10/28/2019  . 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 Non-traumatic rotator cuff tear 09/21/2017  . Syncope and collapse  08/12/2013  . Back pain 08/12/2013  . CAD (coronary artery disease) 08/12/2013  . High blood pressure 03/15/2011  . GERD (gastroesophageal reflux disease) 03/15/2011  . Paronychia of great toe of left foot 03/15/2011    Kerrie Pleasure, PT 05/13/2020, 4:02 PM  [facility] [address] [place], [place], [address] Phone: [phone]   Fax:  [phone]  Name: Wesley [name] MRN: [id number] Date of Birth: 26-Feb-1941

## 2020-05-14 ENCOUNTER — Ambulatory Visit: Payer: Medicare Other

## 2020-05-17 ENCOUNTER — Ambulatory Visit: Payer: Medicare Other | Admitting: Physical Therapy

## 2020-05-18 ENCOUNTER — Ambulatory Visit: Payer: Medicare Other

## 2020-05-20 ENCOUNTER — Ambulatory Visit: Payer: Medicare Other | Attending: General Surgery

## 2020-05-20 ENCOUNTER — Other Ambulatory Visit: Payer: Self-pay

## 2020-05-20 DIAGNOSIS — R2689 Other abnormalities of gait and mobility: Secondary | ICD-10-CM | POA: Insufficient documentation

## 2020-05-20 DIAGNOSIS — R2681 Unsteadiness on feet: Secondary | ICD-10-CM | POA: Diagnosis not present

## 2020-05-20 DIAGNOSIS — I69351 Hemiplegia and hemiparesis following cerebral infarction affecting right dominant side: Secondary | ICD-10-CM | POA: Insufficient documentation

## 2020-05-20 DIAGNOSIS — M6281 Muscle weakness (generalized): Secondary | ICD-10-CM | POA: Diagnosis not present

## 2020-05-20 DIAGNOSIS — K432 Incisional hernia without obstruction or gangrene: Secondary | ICD-10-CM | POA: Diagnosis not present

## 2020-05-20 NOTE — Therapy (Signed)
[facility] [address] [place], [place], [address] Phone: [phone]   Fax:  [phone]  Physical Therapy Treatment  Patient Details  Name: Wesley [name] MRN: [id number] Date of Birth: June 23, 1941 Referring Provider (PT): Dr. Sherrilyn Rist Date: 05/20/2020   PT End of Session - 05/20/20 1447    Visit Number 12    Number of Visits 17    Date for PT Re-Evaluation 06/01/20    Authorization - Visit Number 12    Progress Note Due on Visit 17    PT Start Time 1400    PT Stop Time 1445    PT Time Calculation (min) 45 min    Equipment Utilized During Treatment Gait belt    Activity Tolerance Patient tolerated treatment well    Behavior During Therapy [facility] for tasks assessed/performed           Past Medical History:  Diagnosis Date  . 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 CAD (coronary artery disease)   . Cholelithiasis   . Chronic allergic rhinitis   . Coronary atherosclerosis of native coronary artery   . Fatty liver   . GERD (gastroesophageal reflux disease)   . High blood pressure   . Hypercholesterolemia   . Macrocytosis without anemia   . OSA on CPAP   . Paraesophageal hernia   . Partial bowel obstruction ([place])   . 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 Sleep apnea   . Umbilical hernia without obstruction or gangrene     Past Surgical History:  Procedure Laterality Date  . BIOPSY  10/29/2019   Procedure: BIOPSY;  Surgeon: Jerene Bears, MD;  Location: [facility];  Service: Gastroenterology;;  . CARDIAC CATHETERIZATION  2000   Cath to rule out cardiac problems RT HTN. PT denies significant findings  . CARDIAC CATHETERIZATION  2008  . COLOSTOMY N/A 10/31/2019   Procedure: COLOSTOMY;  Surgeon: Erroll Luna, MD;  [facility];  Service: General;  Laterality: N/A;  . CYSTOSCOPY W/ URETERAL STENT PLACEMENT Bilateral 10/31/2019   Procedure:  CYSTOSCOPY URETERAL STENT PLACEMENT BILATERAL;  Surgeon: Franchot Gallo, MD;  Location: [facility];  Service: Urology;  Laterality: Bilateral;  . CYSTOSCOPY WITH STENT PLACEMENT Bilateral 03/11/2020   Procedure: CYSTOSCOPY WITH BILATERAL FIREFLY INJECTION;  Surgeon: Irine Seal, MD;  [facility];  Service: Urology;  Laterality: Bilateral;  . EYE SURGERY     BILATERAL CATARACT SURGERY WITH LENS IMPLANTS  . FLEXIBLE SIGMOIDOSCOPY N/A 10/29/2019   Procedure: FLEXIBLE SIGMOIDOSCOPY;  Surgeon: Jerene Bears, MD;  Location: [facility];  Service: Gastroenterology;  Laterality: N/A;  . INSERTION OF MESH N/A 09/08/2016   Procedure: INSERTION OF MESH;  Surgeon: Jackolyn Confer, MD;  [facility];  Service: General;  Laterality: N/A;  . LAPAROSCOPIC SIGMOID COLECTOMY N/A 10/31/2019   Procedure: DIAGNOSTIC LAPAROSCOPY; EXPLORATORY LAPAROTOMY; SIGMOID COLECTOMY;  Surgeon: Erroll Luna, MD;  [facility];  Service: General;  Laterality: N/A;  . right rotator cuff    . ROTATOR CUFF REPAIR    . SUBMUCOSAL TATTOO INJECTION  10/29/2019   Procedure: SUBMUCOSAL TATTOO INJECTION;  Surgeon: Jerene Bears, MD;  [facility];  Service: Gastroenterology;;  . TONSILLECTOMY    . UMBILICAL HERNIA REPAIR N/A 09/08/2016   Procedure: OPEN UMBILICAL HERNIA REPAIR WITH MESH;  Surgeon: Jackolyn Confer, MD;  [facility];  Service: General;  Laterality: N/A;  . XI ROBOTIC ASSISTED COLOSTOMY  TAKEDOWN N/A 03/11/2020   Procedure: ROBOTIC ASSISTED COLOSTOMY REVERSAL, RIGID PROCTOSCOPY;  Surgeon: Leighton Ruff, MD;  [facility];  Service: General;  Laterality: N/A;    There were no vitals filed for this visit.   Subjective Assessment - 05/20/20 1418    Subjective Pt reports he is doing okay. He is compliant with HEP    Pertinent History CAD, HTN, HLD, OSA, BPH, GERD    Limitations Walking    How long can you sit comfortably? no issues    Patient Stated Goals be able to walk my dogs for 20  minutes, to play some light tennis with grand daughter,            Manually stretched bil toes into flexion and extension. Also perfomed long axis traction at each toes  Seated manually resisted heel raises: 3 x 10 R and L Seated cotton ball pick ups: 10 cotton balls: R and L Biodex unweighted harness:45 lbs off:5 x 10 bil  Resisted walking fwd : 5 x 20 feet bwd: 5 x 20 Resisted walking: 4 x 80 feet                              PT Short Term Goals - 05/10/20 1839      PT SHORT TERM GOAL #1   Title Patient will be able to walk for 10 min with cane or no AD to improve walking endurance    Baseline walks around at home for <5 min before getting tired., patient walking for 10 - 12 mins at home    Time 4    Period Weeks    Status Achieved    Target Date 05/04/20      PT SHORT TERM GOAL #2   Title Patient will be able to perform 5x bil heel raises with 50lbs unweighted with biodex harness system to improve strength in bil plantarflexors    Baseline Currently 90-100lbs unweighted for pt to perform bil heel raises    Time 4    Period Weeks    Status Not Met    Target Date 06/07/20      PT SHORT TERM GOAL #3   Title Patient will demo 5 sec of SLS to improve dynamic balance when stepping over obstacles.    Baseline 2-3 secs    Time 4    Period Weeks    Status Revised    Target Date 06/07/20             PT Long Term Goals - 05/10/20 1840      PT LONG TERM GOAL #1   Title Patient will be able to walk on uneven grounds, grassy hill incline and decline for 200' without AD to improve gait on uneven grounds    Baseline pt has not attempted    Time 6    Period Weeks    Status New    Target Date 06/21/20      PT LONG TERM GOAL #2   Title Patient will be able to ambulate 1250 feet or more in 6 minutes to improve walking endurance without AD    Baseline 1045    Time 6    Period Weeks    Status New    Target Date 06/21/20      PT LONG TERM GOAL #3    Title Patient will be able to stand 30 seconds for all positions during modified CTSIB  Baseline TBD    Time 6    Period Weeks    Status New    Target Date 06/21/20                 Plan - 05/20/20 1445    Clinical Impression Statement Today's session was focused on continued progression of strengthening of foot and ankle musculature.    Personal Factors and Comorbidities Comorbidity 2;Past/Current Experience;Time since onset of injury/illness/exacerbation    Comorbidities CAD, HTN, HLD, OSA, BPH, GERD    Examination-Activity Limitations Stand;Squat    Examination-Participation Restrictions Yard Work;Community Activity;Cleaning    Stability/Clinical Decision Making Stable/Uncomplicated    Rehab Potential Good    PT Frequency 2x / week    PT Duration 6 weeks    PT Treatment/Interventions ADLs/Self Care Home Management;Gait training;Stair training;Functional mobility training;Therapeutic activities;Therapeutic exercise;Balance training;Neuromuscular re-education;Manual techniques;Patient/family education;Passive range of motion;Energy conservation;Joint Manipulations    PT Next Visit Plan assess 6MWT    Consulted and Agree with Plan of Care Patient           Patient will benefit from skilled therapeutic intervention in order to improve the following deficits and impairments:  Abnormal gait, Decreased activity tolerance, Decreased balance, Decreased endurance, Decreased range of motion, Decreased strength, Impaired flexibility, Impaired tone  Visit Diagnosis: Hemiplegia and hemiparesis following cerebral infarction affecting right dominant side (HCC)  Muscle weakness (generalized)  Other abnormalities of gait and mobility  Unsteadiness on feet     Problem List Patient Active Problem List   Diagnosis Date Noted  . 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 Partial obstruction of colon ([place])   . Generalized abdominal pain   . Abdominal bloating   . Neoplasm of  uncertain behavior of sigmoid colon   . Colonic mass 10/28/2019  . Hypokalemia 10/28/2019  . 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 Non-traumatic rotator cuff tear 09/21/2017  . Syncope and collapse 08/12/2013  . Back pain 08/12/2013  . CAD (coronary artery disease) 08/12/2013  . High blood pressure 03/15/2011  . GERD (gastroesophageal reflux disease) 03/15/2011  . Paronychia of great toe of left foot 03/15/2011    Kerrie Pleasure, PT 05/20/2020, 2:48 PM  [facility] [address] [place], [place], [address] Phone: [phone]   Fax:  [phone]  Name: BURKE TERRY MRN: [id number] Date of Birth: 06/05/1941

## 2020-05-24 ENCOUNTER — Other Ambulatory Visit: Payer: Self-pay

## 2020-05-24 ENCOUNTER — Ambulatory Visit: Payer: Medicare Other

## 2020-05-24 DIAGNOSIS — D1801 Hemangioma of skin and subcutaneous tissue: Secondary | ICD-10-CM | POA: Diagnosis not present

## 2020-05-24 DIAGNOSIS — I69351 Hemiplegia and hemiparesis following cerebral infarction affecting right dominant side: Secondary | ICD-10-CM

## 2020-05-24 DIAGNOSIS — R2681 Unsteadiness on feet: Secondary | ICD-10-CM | POA: Diagnosis not present

## 2020-05-24 DIAGNOSIS — R2689 Other abnormalities of gait and mobility: Secondary | ICD-10-CM

## 2020-05-24 DIAGNOSIS — Z85828 Personal history of other malignant neoplasm of skin: Secondary | ICD-10-CM | POA: Diagnosis not present

## 2020-05-24 DIAGNOSIS — L570 Actinic keratosis: Secondary | ICD-10-CM | POA: Diagnosis not present

## 2020-05-24 DIAGNOSIS — L821 Other seborrheic keratosis: Secondary | ICD-10-CM | POA: Diagnosis not present

## 2020-05-24 DIAGNOSIS — M6281 Muscle weakness (generalized): Secondary | ICD-10-CM

## 2020-05-24 NOTE — Therapy (Signed)
[facility] [address] [place], [place], [address] Phone: [phone]   Fax:  [phone]  Physical Therapy Treatment  Patient Details  Name: Wesley [name] MRN: [id number] Date of Birth: 12/19/1940 Referring Provider (PT): Dr. Sherrilyn Rist Date: 05/24/2020   PT End of Session - 05/24/20 1732    Visit Number 13    Number of Visits 17    Date for PT Re-Evaluation 06/01/20    Authorization - Visit Number 13    Progress Note Due on Visit 17    PT Start Time 1700    PT Stop Time 1745    PT Time Calculation (min) 45 min    Equipment Utilized During Treatment Gait belt    Activity Tolerance Patient tolerated treatment well    Behavior During Therapy [facility] for tasks assessed/performed           Past Medical History:  Diagnosis Date  . 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 CAD (coronary artery disease)   . Cholelithiasis   . Chronic allergic rhinitis   . Coronary atherosclerosis of native coronary artery   . Fatty liver   . GERD (gastroesophageal reflux disease)   . High blood pressure   . Hypercholesterolemia   . Macrocytosis without anemia   . OSA on CPAP   . Paraesophageal hernia   . Partial bowel obstruction ([place])   . 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 Sleep apnea   . Umbilical hernia without obstruction or gangrene     Past Surgical History:  Procedure Laterality Date  . BIOPSY  10/29/2019   Procedure: BIOPSY;  Surgeon: Jerene Bears, MD;  Location: [facility];  Service: Gastroenterology;;  . CARDIAC CATHETERIZATION  2000   Cath to rule out cardiac problems RT HTN. PT denies significant findings  . CARDIAC CATHETERIZATION  2008  . COLOSTOMY N/A 10/31/2019   Procedure: COLOSTOMY;  Surgeon: Erroll Luna, MD;  Location: [facility];  Service: General;  Laterality: N/A;  . CYSTOSCOPY W/ URETERAL STENT PLACEMENT Bilateral 10/31/2019    Procedure: CYSTOSCOPY URETERAL STENT PLACEMENT BILATERAL;  Surgeon: Franchot Gallo, MD;  [facility];  Service: Urology;  Laterality: Bilateral;  . CYSTOSCOPY WITH STENT PLACEMENT Bilateral 03/11/2020   Procedure: CYSTOSCOPY WITH BILATERAL FIREFLY INJECTION;  Surgeon: Irine Seal, MD;  [facility];  Service: Urology;  Laterality: Bilateral;  . EYE SURGERY     BILATERAL CATARACT SURGERY WITH LENS IMPLANTS  . FLEXIBLE SIGMOIDOSCOPY N/A 10/29/2019   Procedure: FLEXIBLE SIGMOIDOSCOPY;  Surgeon: Jerene Bears, MD;  Location: [facility];  Service: Gastroenterology;  Laterality: N/A;  . INSERTION OF MESH N/A 09/08/2016   Procedure: INSERTION OF MESH;  Surgeon: Jackolyn Confer, MD;  [facility];  Service: General;  Laterality: N/A;  . LAPAROSCOPIC SIGMOID COLECTOMY N/A 10/31/2019   Procedure: DIAGNOSTIC LAPAROSCOPY; EXPLORATORY LAPAROTOMY; SIGMOID COLECTOMY;  Surgeon: Erroll Luna, MD;  Location: [facility];  Service: General;  Laterality: N/A;  . right rotator cuff    . ROTATOR CUFF REPAIR    . SUBMUCOSAL TATTOO INJECTION  10/29/2019   Procedure: SUBMUCOSAL TATTOO INJECTION;  Surgeon: Jerene Bears, MD;  [facility]: [facility];  Service: Gastroenterology;;  . TONSILLECTOMY    . UMBILICAL HERNIA REPAIR N/A 09/08/2016   Procedure: OPEN UMBILICAL HERNIA REPAIR WITH MESH;  Surgeon: Jackolyn Confer, MD;  [facility]: [facility];  Service: General;  Laterality: N/A;  . XI ROBOTIC ASSISTED COLOSTOMY  TAKEDOWN N/A 03/11/2020   Procedure: ROBOTIC ASSISTED COLOSTOMY REVERSAL, RIGID PROCTOSCOPY;  Surgeon: Leighton Ruff, MD;  Location: [facility];  Service: General;  Laterality: N/A;    There were no vitals filed for this visit.   Subjective Assessment - 05/24/20 1731    Subjective I feel like I am getting stronger.    Pertinent History CAD, HTN, HLD, OSA, BPH, GERD    Limitations Walking    How long can you sit comfortably? no issues    Patient Stated Goals be able to walk my dogs for 20 minutes, to  play some light tennis with grand daughter,                 Supine slow reversal with manual resistance of ankle DF and ankle PF: started with slower speed and progressed to quicker change in directions: total 10' Supine isometric hold in end range plantar flexion and eccentric control: 30x Bent over on countertop with elbows on coutnertop: heel raises: 5 x 10 bil Resisted walking: black sport cord: 3 x 120 feet, worked on gradually increasing speed of walking. 1 x 207' of normal walking: noted significantly improved cadence.                      PT Short Term Goals - 05/10/20 1839      PT SHORT TERM GOAL #1   Title Patient will be able to walk for 10 min with cane or no AD to improve walking endurance    Baseline walks around at home for <5 min before getting tired., patient walking for 10 - 12 mins at home    Time 4    Period Weeks    Status Achieved    Target Date 05/04/20      PT SHORT TERM GOAL #2   Title Patient will be able to perform 5x bil heel raises with 50lbs unweighted with biodex harness system to improve strength in bil plantarflexors    Baseline Currently 90-100lbs unweighted for pt to perform bil heel raises    Time 4    Period Weeks    Status Not Met    Target Date 06/07/20      PT SHORT TERM GOAL #3   Title Patient will demo 5 sec of SLS to improve dynamic balance when stepping over obstacles.    Baseline 2-3 secs    Time 4    Period Weeks    Status Revised    Target Date 06/07/20             PT Long Term Goals - 05/10/20 1840      PT LONG TERM GOAL #1   Title Patient will be able to walk on uneven grounds, grassy hill incline and decline for 200' without AD to improve gait on uneven grounds    Baseline pt has not attempted    Time 6    Period Weeks    Status New    Target Date 06/21/20      PT LONG TERM GOAL #2   Title Patient will be able to ambulate 1250 feet or more in 6 minutes to improve walking endurance without  AD    Baseline 1045    Time 6    Period Weeks    Status New    Target Date 06/21/20      PT LONG TERM GOAL #3   Title Patient will be able to stand 30 seconds for all positions during modified  CTSIB    Baseline TBD    Time 6    Period Weeks    Status New    Target Date 06/21/20                 Plan - 05/24/20 1731    Clinical Impression Statement Today's session was focused on continued progression of calf strengthening and imprving gait speed. Patient's R calf is weaker compared to L calf but is able to perform heel raise in standing now. patient is demonstrating improving cadence overall.    Personal Factors and Comorbidities Comorbidity 2;Past/Current Experience;Time since onset of injury/illness/exacerbation    Comorbidities CAD, HTN, HLD, OSA, BPH, GERD    Examination-Activity Limitations Stand;Squat    Examination-Participation Restrictions Yard Work;Community Activity;Cleaning    Stability/Clinical Decision Making Stable/Uncomplicated    Rehab Potential Good    PT Frequency 2x / week    PT Duration 6 weeks    PT Treatment/Interventions ADLs/Self Care Home Management;Gait training;Stair training;Functional mobility training;Therapeutic activities;Therapeutic exercise;Balance training;Neuromuscular re-education;Manual techniques;Patient/family education;Passive range of motion;Energy conservation;Joint Manipulations    PT Next Visit Plan assess 6MWT    Consulted and Agree with Plan of Care Patient           Patient will benefit from skilled therapeutic intervention in order to improve the following deficits and impairments:  Abnormal gait, Decreased activity tolerance, Decreased balance, Decreased endurance, Decreased range of motion, Decreased strength, Impaired flexibility, Impaired tone  Visit Diagnosis: Hemiplegia and hemiparesis following cerebral infarction affecting right dominant side (HCC)  Muscle weakness (generalized)  Other abnormalities of gait and  mobility  Unsteadiness on feet     Problem List Patient Active Problem List   Diagnosis Date Noted  . 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 Partial obstruction of colon ([place])   . Generalized abdominal pain   . Abdominal bloating   . 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 Colonic mass 10/28/2019  . Hypokalemia 10/28/2019  . 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 Non-traumatic rotator cuff tear 09/21/2017  . Syncope and collapse 08/12/2013  . Back pain 08/12/2013  . CAD (coronary artery disease) 08/12/2013  . High blood pressure 03/15/2011  . GERD (gastroesophageal reflux disease) 03/15/2011  . Paronychia of great toe of left foot 03/15/2011    Kerrie Pleasure, PT 05/24/2020, 5:45 PM  [facility] [address] [place], [place], [address] Phone: [phone]   Fax:  [phone]  Name: KYNDALL CHAPLIN MRN: [id number] Date of Birth: 1941-04-23

## 2020-05-28 ENCOUNTER — Ambulatory Visit: Payer: Medicare Other

## 2020-05-28 ENCOUNTER — Other Ambulatory Visit: Payer: Self-pay

## 2020-05-28 DIAGNOSIS — R2681 Unsteadiness on feet: Secondary | ICD-10-CM

## 2020-05-28 DIAGNOSIS — Z23 Encounter for immunization: Secondary | ICD-10-CM | POA: Diagnosis not present

## 2020-05-28 DIAGNOSIS — M6281 Muscle weakness (generalized): Secondary | ICD-10-CM

## 2020-05-28 DIAGNOSIS — I69351 Hemiplegia and hemiparesis following cerebral infarction affecting right dominant side: Secondary | ICD-10-CM | POA: Diagnosis not present

## 2020-05-28 DIAGNOSIS — R2689 Other abnormalities of gait and mobility: Secondary | ICD-10-CM

## 2020-05-28 NOTE — Therapy (Signed)
[facility] [address] [place], [place], [address] Phone: [phone]   Fax:  [phone]  Physical Therapy Treatment  Patient Details  Name: Wesley [name] MRN: [id number] Date of Birth: 03/05/1941 Referring Provider (PT): Dr. Sherrilyn Rist Date: 05/28/2020   PT End of Session - 05/28/20 1506    Visit Number 14    Number of Visits 17    Date for PT Re-Evaluation 06/01/20    Authorization - Visit Number 14    Progress Note Due on Visit 17    PT Start Time 9924    PT Stop Time 1445    PT Time Calculation (min) 40 min    Equipment Utilized During Treatment Gait belt    Activity Tolerance Patient tolerated treatment well    Behavior During Therapy [facility] for tasks assessed/performed           Past Medical History:  Diagnosis Date  . 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 CAD (coronary artery disease)   . Cholelithiasis   . Chronic allergic rhinitis   . Coronary atherosclerosis of native coronary artery   . Fatty liver   . GERD (gastroesophageal reflux disease)   . High blood pressure   . Hypercholesterolemia   . Macrocytosis without anemia   . OSA on CPAP   . Paraesophageal hernia   . Partial bowel obstruction ([place])   . 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 Sleep apnea   . Umbilical hernia without obstruction or gangrene     Past Surgical History:  Procedure Laterality Date  . BIOPSY  10/29/2019   Procedure: BIOPSY;  Surgeon: Jerene Bears, MD;  Location: [facility];  Service: Gastroenterology;;  . CARDIAC CATHETERIZATION  2000   Cath to rule out cardiac problems RT HTN. PT denies significant findings  . CARDIAC CATHETERIZATION  2008  . COLOSTOMY N/A 10/31/2019   Procedure: COLOSTOMY;  Surgeon: Erroll Luna, MD;  [facility];  Service: General;  Laterality: N/A;  . CYSTOSCOPY W/ URETERAL STENT PLACEMENT Bilateral 10/31/2019    Procedure: CYSTOSCOPY URETERAL STENT PLACEMENT BILATERAL;  Surgeon: Franchot Gallo, MD;  [facility];  Service: Urology;  Laterality: Bilateral;  . CYSTOSCOPY WITH STENT PLACEMENT Bilateral 03/11/2020   Procedure: CYSTOSCOPY WITH BILATERAL FIREFLY INJECTION;  Surgeon: Irine Seal, MD;  [facility];  Service: Urology;  Laterality: Bilateral;  . EYE SURGERY     BILATERAL CATARACT SURGERY WITH LENS IMPLANTS  . FLEXIBLE SIGMOIDOSCOPY N/A 10/29/2019   Procedure: FLEXIBLE SIGMOIDOSCOPY;  Surgeon: Jerene Bears, MD;  Location: [facility];  Service: Gastroenterology;  Laterality: N/A;  . INSERTION OF MESH N/A 09/08/2016   Procedure: INSERTION OF MESH;  Surgeon: Jackolyn Confer, MD;  [facility];  Service: General;  Laterality: N/A;  . LAPAROSCOPIC SIGMOID COLECTOMY N/A 10/31/2019   Procedure: DIAGNOSTIC LAPAROSCOPY; EXPLORATORY LAPAROTOMY; SIGMOID COLECTOMY;  Surgeon: Erroll Luna, MD;  [facility];  Service: General;  Laterality: N/A;  . right rotator cuff    . ROTATOR CUFF REPAIR    . SUBMUCOSAL TATTOO INJECTION  10/29/2019   Procedure: SUBMUCOSAL TATTOO INJECTION;  Surgeon: Jerene Bears, MD;  [facility];  Service: Gastroenterology;;  . TONSILLECTOMY    . UMBILICAL HERNIA REPAIR N/A 09/08/2016   Procedure: OPEN UMBILICAL HERNIA REPAIR WITH MESH;  Surgeon: Jackolyn Confer, MD;  [facility];  Service: General;  Laterality: N/A;  . XI ROBOTIC ASSISTED COLOSTOMY  TAKEDOWN N/A 03/11/2020   Procedure: ROBOTIC ASSISTED COLOSTOMY REVERSAL, RIGID PROCTOSCOPY;  Surgeon: Leighton Ruff, MD;  [facility];  Service: General;  Laterality: N/A;    There were no vitals filed for this visit.   Subjective Assessment - 05/28/20 1504    Subjective I did lot of work in the yard so I am tired today.    Pertinent History CAD, HTN, HLD, OSA, BPH, GERD    Limitations Walking    How long can you sit comfortably? no issues    Patient Stated Goals be able to walk my dogs for 20  minutes, to play some light tennis with grand daughter,               Manual therapy: STM to distal toe flexors and extensor tendons, STM to toe extensors tendons over dorsal ankle and foot Subtalar PA mobilization in bil ankles to improve plantarflexion at mid foot  TherEx: Manually stretched bil toe flexors and extensors Manually resisted ankle inversion and eversion: 30x, with cueing to not engage hip rotators: Manually resisted ankle plantarflexors knee extended: 30x R and L Bent over on countertop on elbows: heel raises: 3 x 10, cues to not flex knees                        PT Short Term Goals - 05/10/20 1839      PT SHORT TERM GOAL #1   Title Patient will be able to walk for 10 min with cane or no AD to improve walking endurance    Baseline walks around at home for <5 min before getting tired., patient walking for 10 - 12 mins at home    Time 4    Period Weeks    Status Achieved    Target Date 05/04/20      PT SHORT TERM GOAL #2   Title Patient will be able to perform 5x bil heel raises with 50lbs unweighted with biodex harness system to improve strength in bil plantarflexors    Baseline Currently 90-100lbs unweighted for pt to perform bil heel raises    Time 4    Period Weeks    Status Not Met    Target Date 06/07/20      PT SHORT TERM GOAL #3   Title Patient will demo 5 sec of SLS to improve dynamic balance when stepping over obstacles.    Baseline 2-3 secs    Time 4    Period Weeks    Status Revised    Target Date 06/07/20             PT Long Term Goals - 05/10/20 1840      PT LONG TERM GOAL #1   Title Patient will be able to walk on uneven grounds, grassy hill incline and decline for 200' without AD to improve gait on uneven grounds    Baseline pt has not attempted    Time 6    Period Weeks    Status New    Target Date 06/21/20      PT LONG TERM GOAL #2   Title Patient will be able to ambulate 1250 feet or more in 6 minutes  to improve walking endurance without AD    Baseline 1045    Time 6    Period Weeks    Status New    Target Date 06/21/20      PT LONG TERM GOAL #3   Title Patient will be  able to stand 30 seconds for all positions during modified CTSIB    Baseline TBD    Time 6    Period Weeks    Status New    Target Date 06/21/20                 Plan - 05/28/20 1505    Clinical Impression Statement notable limp was present today, most likely due to being fatigued from working in the yard earlier. Patient is demonstrating slowly improving strength in bil plantarflexors. patient continues to have significant distal toe flexor and extensor tightness in bil feet.    Personal Factors and Comorbidities Comorbidity 2;Past/Current Experience;Time since onset of injury/illness/exacerbation    Comorbidities CAD, HTN, HLD, OSA, BPH, GERD    Examination-Activity Limitations Stand;Squat    Examination-Participation Restrictions Yard Work;Community Activity;Cleaning    Stability/Clinical Decision Making Stable/Uncomplicated    Rehab Potential Good    PT Frequency 2x / week    PT Duration 6 weeks    PT Treatment/Interventions ADLs/Self Care Home Management;Gait training;Stair training;Functional mobility training;Therapeutic activities;Therapeutic exercise;Balance training;Neuromuscular re-education;Manual techniques;Patient/family education;Passive range of motion;Energy conservation;Joint Manipulations    PT Next Visit Plan assess 6MWT    Consulted and Agree with Plan of Care Patient           Patient will benefit from skilled therapeutic intervention in order to improve the following deficits and impairments:  Abnormal gait, Decreased activity tolerance, Decreased balance, Decreased endurance, Decreased range of motion, Decreased strength, Impaired flexibility, Impaired tone  Visit Diagnosis: Muscle weakness (generalized)  Other abnormalities of gait and mobility  Unsteadiness on  feet     Problem List Patient Active Problem List   Diagnosis Date Noted  . 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 Partial obstruction of colon ([place])   . Generalized abdominal pain   . Abdominal bloating   . 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 Colonic mass 10/28/2019  . Hypokalemia 10/28/2019  . 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 Non-traumatic rotator cuff tear 09/21/2017  . Syncope and collapse 08/12/2013  . Back pain 08/12/2013  . CAD (coronary artery disease) 08/12/2013  . High blood pressure 03/15/2011  . GERD (gastroesophageal reflux disease) 03/15/2011  . Paronychia of great toe of left foot 03/15/2011    Kerrie Pleasure, PT 05/28/2020, 3:07 PM  [facility] [address] [place], [place], [address] Phone: [phone]   Fax:  [phone]  Name: Wesley [name] MRN: [id number] Date of Birth: [birth year]

## 2020-05-31 ENCOUNTER — Ambulatory Visit: Payer: Medicare Other

## 2020-06-04 ENCOUNTER — Ambulatory Visit: Payer: Medicare Other

## 2020-06-07 ENCOUNTER — Ambulatory Visit: Payer: Medicare Other

## 2020-06-10 ENCOUNTER — Ambulatory Visit: Payer: Medicare Other

## 2020-06-14 ENCOUNTER — Ambulatory Visit: Payer: Medicare Other

## 2020-06-18 ENCOUNTER — Ambulatory Visit: Payer: Medicare Other | Attending: General Surgery

## 2020-06-18 ENCOUNTER — Other Ambulatory Visit: Payer: Self-pay

## 2020-06-18 DIAGNOSIS — M6281 Muscle weakness (generalized): Secondary | ICD-10-CM | POA: Diagnosis not present

## 2020-06-18 DIAGNOSIS — R2689 Other abnormalities of gait and mobility: Secondary | ICD-10-CM | POA: Diagnosis not present

## 2020-06-18 DIAGNOSIS — R2681 Unsteadiness on feet: Secondary | ICD-10-CM | POA: Diagnosis not present

## 2020-06-18 DIAGNOSIS — I69351 Hemiplegia and hemiparesis following cerebral infarction affecting right dominant side: Secondary | ICD-10-CM | POA: Insufficient documentation

## 2020-06-18 NOTE — Therapy (Signed)
[facility] [address] [place], [place], [address] Phone: [phone]   Fax:  [phone]  Physical Therapy Treatment  Patient Details  Name: Wesley [name] MRN: [id number] Date of Birth: 01-02-1941 Referring Provider (PT): Dr. Sherrilyn Rist Date: 06/18/2020   PT End of Session - 06/18/20 1428    Visit Number 15    Number of Visits 17    Date for PT Re-Evaluation 06/01/20    Authorization - Visit Number 15    Progress Note Due on Visit 17    PT Start Time 1410    PT Stop Time 1445    PT Time Calculation (min) 35 min    Equipment Utilized During Treatment Gait belt    Activity Tolerance Patient tolerated treatment well    Behavior During Therapy [facility] for tasks assessed/performed           Past Medical History:  Diagnosis Date  . 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 CAD (coronary artery disease)   . Cholelithiasis   . Chronic allergic rhinitis   . Coronary atherosclerosis of native coronary artery   . Fatty liver   . GERD (gastroesophageal reflux disease)   . High blood pressure   . Hypercholesterolemia   . Macrocytosis without anemia   . OSA on CPAP   . Paraesophageal hernia   . Partial bowel obstruction ([place])   . 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 Sleep apnea   . Umbilical hernia without obstruction or gangrene     Past Surgical History:  Procedure Laterality Date  . BIOPSY  10/29/2019   Procedure: BIOPSY;  Surgeon: Jerene Bears, MD;  Location: [facility];  Service: Gastroenterology;;  . CARDIAC CATHETERIZATION  2000   Cath to rule out cardiac problems RT HTN. PT denies significant findings  . CARDIAC CATHETERIZATION  2008  . COLOSTOMY N/A 10/31/2019   Procedure: COLOSTOMY;  Surgeon: Erroll Luna, MD;  Location: [facility];  Service: General;  Laterality: N/A;  . CYSTOSCOPY W/ URETERAL STENT PLACEMENT Bilateral 10/31/2019   Procedure:  CYSTOSCOPY URETERAL STENT PLACEMENT BILATERAL;  Surgeon: Franchot Gallo, MD;  [facility];  Service: Urology;  Laterality: Bilateral;  . CYSTOSCOPY WITH STENT PLACEMENT Bilateral 03/11/2020   Procedure: CYSTOSCOPY WITH BILATERAL FIREFLY INJECTION;  Surgeon: Irine Seal, MD;  [facility];  Service: Urology;  Laterality: Bilateral;  . EYE SURGERY     BILATERAL CATARACT SURGERY WITH LENS IMPLANTS  . FLEXIBLE SIGMOIDOSCOPY N/A 10/29/2019   Procedure: FLEXIBLE SIGMOIDOSCOPY;  Surgeon: Jerene Bears, MD;  Location: [facility];  Service: Gastroenterology;  Laterality: N/A;  . INSERTION OF MESH N/A 09/08/2016   Procedure: INSERTION OF MESH;  Surgeon: Jackolyn Confer, MD;  [facility];  Service: General;  Laterality: N/A;  . LAPAROSCOPIC SIGMOID COLECTOMY N/A 10/31/2019   Procedure: DIAGNOSTIC LAPAROSCOPY; EXPLORATORY LAPAROTOMY; SIGMOID COLECTOMY;  Surgeon: Erroll Luna, MD;  Location: [facility];  Service: General;  Laterality: N/A;  . right rotator cuff    . ROTATOR CUFF REPAIR    . SUBMUCOSAL TATTOO INJECTION  10/29/2019   Procedure: SUBMUCOSAL TATTOO INJECTION;  Surgeon: Jerene Bears, MD;  [facility]: [facility];  Service: Gastroenterology;;  . TONSILLECTOMY    . UMBILICAL HERNIA REPAIR N/A 09/08/2016   Procedure: OPEN UMBILICAL HERNIA REPAIR WITH MESH;  Surgeon: Jackolyn Confer, MD;  Location: [facility];  Service: General;  Laterality: N/A;  . XI ROBOTIC ASSISTED COLOSTOMY  TAKEDOWN N/A 03/11/2020   Procedure: ROBOTIC ASSISTED COLOSTOMY REVERSAL, RIGID PROCTOSCOPY;  Surgeon: Leighton Ruff, MD;  [facility];  Service: General;  Laterality: N/A;    There were no vitals filed for this visit.   Subjective Assessment - 06/18/20 1427    Subjective Pt reports he didn't do lot of exercises while on vacation. He had hard time walking on soft sand on beach.    Pertinent History CAD, HTN, HLD, OSA, BPH, GERD    Limitations Walking    How long can you sit comfortably? no issues     Patient Stated Goals be able to walk my dogs for 20 minutes, to play some light tennis with grand daughter,                Seated heel raises: black band: 20x R and L, 2x black band: 20x R and L Leaning over countertop: 3 x 10 heel raises Tandem walk without HHA: 2 laps, min A required Tandem walking at countertop with 1 HHA: 6x Tandem stance: EO: 3 x 30" R and L Standing on incline: narrow BOS: EC: 2 x 30"  Standing on decline: narrow BOS: EC: 2 x 30"                       PT Short Term Goals - 05/10/20 1839      PT SHORT TERM GOAL #1   Title Patient will be able to walk for 10 min with cane or no AD to improve walking endurance    Baseline walks around at home for <5 min before getting tired., patient walking for 10 - 12 mins at home    Time 4    Period Weeks    Status Achieved    Target Date 05/04/20      PT SHORT TERM GOAL #2   Title Patient will be able to perform 5x bil heel raises with 50lbs unweighted with biodex harness system to improve strength in bil plantarflexors    Baseline Currently 90-100lbs unweighted for pt to perform bil heel raises    Time 4    Period Weeks    Status Not Met    Target Date 06/07/20      PT SHORT TERM GOAL #3   Title Patient will demo 5 sec of SLS to improve dynamic balance when stepping over obstacles.    Baseline 2-3 secs    Time 4    Period Weeks    Status Revised    Target Date 06/07/20             PT Long Term Goals - 05/10/20 1840      PT LONG TERM GOAL #1   Title Patient will be able to walk on uneven grounds, grassy hill incline and decline for 200' without AD to improve gait on uneven grounds    Baseline pt has not attempted    Time 6    Period Weeks    Status New    Target Date 06/21/20      PT LONG TERM GOAL #2   Title Patient will be able to ambulate 1250 feet or more in 6 minutes to improve walking endurance without AD    Baseline 1045    Time 6    Period Weeks    Status New     Target Date 06/21/20      PT LONG TERM GOAL #3   Title Patient will be able to stand 30  seconds for all positions during modified CTSIB    Baseline TBD    Time 6    Period Weeks    Status New    Target Date 06/21/20                 Plan - 06/18/20 1427    Clinical Impression Statement Today's session was focused on cotninued progression of bil calf strength to improve gait.    Personal Factors and Comorbidities Comorbidity 2;Past/Current Experience;Time since onset of injury/illness/exacerbation    Comorbidities CAD, HTN, HLD, OSA, BPH, GERD    Examination-Activity Limitations Stand;Squat    Examination-Participation Restrictions Yard Work;Community Activity;Cleaning    Stability/Clinical Decision Making Stable/Uncomplicated    Rehab Potential Good    PT Frequency 2x / week    PT Duration 6 weeks    PT Treatment/Interventions ADLs/Self Care Home Management;Gait training;Stair training;Functional mobility training;Therapeutic activities;Therapeutic exercise;Balance training;Neuromuscular re-education;Manual techniques;Patient/family education;Passive range of motion;Energy conservation;Joint Manipulations    PT Next Visit Plan assess 6MWT    Consulted and Agree with Plan of Care Patient           Patient will benefit from skilled therapeutic intervention in order to improve the following deficits and impairments:  Abnormal gait, Decreased activity tolerance, Decreased balance, Decreased endurance, Decreased range of motion, Decreased strength, Impaired flexibility, Impaired tone  Visit Diagnosis: Muscle weakness (generalized)  Other abnormalities of gait and mobility  Unsteadiness on feet  Hemiplegia and hemiparesis following cerebral infarction affecting right dominant side [facility])     Problem List Patient Active Problem List   Diagnosis Date Noted  . 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 Partial obstruction of colon ([place])   . Generalized abdominal pain   .  Abdominal bloating   . 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 Colonic mass 10/28/2019  . Hypokalemia 10/28/2019  . 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 Non-traumatic rotator cuff tear 09/21/2017  . Syncope and collapse 08/12/2013  . Back pain 08/12/2013  . CAD (coronary artery disease) 08/12/2013  . High blood pressure 03/15/2011  . GERD (gastroesophageal reflux disease) 03/15/2011  . Paronychia of great toe of left foot 03/15/2011    Kerrie Pleasure 06/18/2020, 2:46 PM  [facility] [address] [place], [place], [address] Phone: [phone]   Fax:  [phone]  Name: Wesley [name] MRN: [id number] Date of Birth: Dec 24, 1940

## 2020-06-20 ENCOUNTER — Other Ambulatory Visit: Payer: Self-pay | Admitting: Cardiology

## 2020-06-21 ENCOUNTER — Other Ambulatory Visit: Payer: Self-pay

## 2020-06-21 ENCOUNTER — Ambulatory Visit: Payer: Medicare Other

## 2020-06-21 DIAGNOSIS — I69351 Hemiplegia and hemiparesis following cerebral infarction affecting right dominant side: Secondary | ICD-10-CM

## 2020-06-21 DIAGNOSIS — M6281 Muscle weakness (generalized): Secondary | ICD-10-CM | POA: Diagnosis not present

## 2020-06-21 DIAGNOSIS — R2689 Other abnormalities of gait and mobility: Secondary | ICD-10-CM | POA: Diagnosis not present

## 2020-06-21 DIAGNOSIS — R2681 Unsteadiness on feet: Secondary | ICD-10-CM

## 2020-06-21 NOTE — Therapy (Signed)
[facility] [address] [place], [place], [address] Phone: [phone]   Fax:  [phone]  Physical Therapy Treatment  Patient Details  Name: Wesley [name] MRN: [id number] Date of Birth: September 06, 1940 Referring Provider (PT): Dr. Sherrilyn Rist Date: 06/21/2020   PT End of Session - 06/21/20 1635    Visit Number 16    Number of Visits 17    Date for PT Re-Evaluation 06/01/20    Authorization - Visit Number 16    Progress Note Due on Visit 17    PT Start Time 1620    PT Stop Time 1700    PT Time Calculation (min) 40 min    Equipment Utilized During Treatment Gait belt    Activity Tolerance Patient tolerated treatment well    Behavior During Therapy [facility] for tasks assessed/performed           Past Medical History:  Diagnosis Date  . 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 CAD (coronary artery disease)   . Cholelithiasis   . Chronic allergic rhinitis   . Coronary atherosclerosis of native coronary artery   . Fatty liver   . GERD (gastroesophageal reflux disease)   . High blood pressure   . Hypercholesterolemia   . Macrocytosis without anemia   . OSA on CPAP   . Paraesophageal hernia   . Partial bowel obstruction ([place])   . 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 Sleep apnea   . Umbilical hernia without obstruction or gangrene     Past Surgical History:  Procedure Laterality Date  . BIOPSY  10/29/2019   Procedure: BIOPSY;  Surgeon: Jerene Bears, MD;  Location: [facility];  Service: Gastroenterology;;  . CARDIAC CATHETERIZATION  2000   Cath to rule out cardiac problems RT HTN. PT denies significant findings  . CARDIAC CATHETERIZATION  2008  . COLOSTOMY N/A 10/31/2019   Procedure: COLOSTOMY;  Surgeon: Erroll Luna, MD;  [facility];  Service: General;  Laterality: N/A;  . CYSTOSCOPY W/ URETERAL STENT PLACEMENT Bilateral 10/31/2019   Procedure:  CYSTOSCOPY URETERAL STENT PLACEMENT BILATERAL;  Surgeon: Franchot Gallo, MD;  [facility];  Service: Urology;  Laterality: Bilateral;  . CYSTOSCOPY WITH STENT PLACEMENT Bilateral 03/11/2020   Procedure: CYSTOSCOPY WITH BILATERAL FIREFLY INJECTION;  Surgeon: Irine Seal, MD;  [facility];  Service: Urology;  Laterality: Bilateral;  . EYE SURGERY     BILATERAL CATARACT SURGERY WITH LENS IMPLANTS  . FLEXIBLE SIGMOIDOSCOPY N/A 10/29/2019   Procedure: FLEXIBLE SIGMOIDOSCOPY;  Surgeon: Jerene Bears, MD;  Location: [facility];  Service: Gastroenterology;  Laterality: N/A;  . INSERTION OF MESH N/A 09/08/2016   Procedure: INSERTION OF MESH;  Surgeon: Jackolyn Confer, MD;  [facility];  Service: General;  Laterality: N/A;  . LAPAROSCOPIC SIGMOID COLECTOMY N/A 10/31/2019   Procedure: DIAGNOSTIC LAPAROSCOPY; EXPLORATORY LAPAROTOMY; SIGMOID COLECTOMY;  Surgeon: Erroll Luna, MD;  [facility];  Service: General;  Laterality: N/A;  . right rotator cuff    . ROTATOR CUFF REPAIR    . SUBMUCOSAL TATTOO INJECTION  10/29/2019   Procedure: SUBMUCOSAL TATTOO INJECTION;  Surgeon: Jerene Bears, MD;  [facility];  Service: Gastroenterology;;  . TONSILLECTOMY    . UMBILICAL HERNIA REPAIR N/A 09/08/2016   Procedure: OPEN UMBILICAL HERNIA REPAIR WITH MESH;  Surgeon: Jackolyn Confer, MD;  [facility];  Service: General;  Laterality: N/A;  . XI ROBOTIC ASSISTED COLOSTOMY  TAKEDOWN N/A 03/11/2020   Procedure: ROBOTIC ASSISTED COLOSTOMY REVERSAL, RIGID PROCTOSCOPY;  Surgeon: Leighton Ruff, MD;  [facility];  Service: General;  Laterality: N/A;    There were no vitals filed for this visit.   Subjective Assessment - 06/21/20 1622    Subjective Pt reports he wasn't signiicantly fatigued after last session. I feel like I am getting stronger in my legs.    Pertinent History CAD, HTN, HLD, OSA, BPH, GERD    Limitations Walking    How long can you sit comfortably? no issues     Patient Stated Goals be able to walk my dogs for 20 minutes, to play some light tennis with grand daughter,                Tandem walking: CGA to min A: 3 x 10 feet, pt cued to shuffle feet more and go slower to work on balance Swaying laterally and fwd/bwd while standing in partial tandem: to improve limits of stability: 10x each way, each side, pt educated on using stepping strategy when he fails limits of stability to improve agility SL hip abduction: 3 x 10 R and L   SL hip abduction: 3 x 10 Gait training: 1 x 207' with arm swings Normal gait speed: 0.74ms Normal gait with cues for arm swings: 0.930m                    PT Short Term Goals - 05/10/20 1839      PT SHORT TERM GOAL #1   Title Patient will be able to walk for 10 min with cane or no AD to improve walking endurance    Baseline walks around at home for <5 min before getting tired., patient walking for 10 - 12 mins at home    Time 4    Period Weeks    Status Achieved    Target Date 05/04/20      PT SHORT TERM GOAL #2   Title Patient will be able to perform 5x bil heel raises with 50lbs unweighted with biodex harness system to improve strength in bil plantarflexors    Baseline Currently 90-100lbs unweighted for pt to perform bil heel raises    Time 4    Period Weeks    Status Not Met    Target Date 06/07/20      PT SHORT TERM GOAL #3   Title Patient will demo 5 sec of SLS to improve dynamic balance when stepping over obstacles.    Baseline 2-3 secs    Time 4    Period Weeks    Status Revised    Target Date 06/07/20             PT Long Term Goals - 05/10/20 1840      PT LONG TERM GOAL #1   Title Patient will be able to walk on uneven grounds, grassy hill incline and decline for 200' without AD to improve gait on uneven grounds    Baseline pt has not attempted    Time 6    Period Weeks    Status New    Target Date 06/21/20      PT LONG TERM GOAL #2   Title Patient will be able  to ambulate 1250 feet or more in 6 minutes to improve walking endurance without AD    Baseline 1045    Time 6    Period Weeks    Status New    Target Date 06/21/20  PT LONG TERM GOAL #3   Title Patient will be able to stand 30 seconds for all positions during modified CTSIB    Baseline TBD    Time 6    Period Weeks    Status New    Target Date 06/21/20                 Plan - 06/21/20 1635    Clinical Impression Statement Today's session was focused on improving balance with tandem stance, working on limits of stability and stepping strategies.    Personal Factors and Comorbidities Comorbidity 2;Past/Current Experience;Time since onset of injury/illness/exacerbation    Comorbidities CAD, HTN, HLD, OSA, BPH, GERD    Examination-Activity Limitations Stand;Squat    Examination-Participation Restrictions Yard Work;Community Activity;Cleaning    Stability/Clinical Decision Making Stable/Uncomplicated    Rehab Potential Good    PT Frequency 2x / week    PT Duration 6 weeks    PT Treatment/Interventions ADLs/Self Care Home Management;Gait training;Stair training;Functional mobility training;Therapeutic activities;Therapeutic exercise;Balance training;Neuromuscular re-education;Manual techniques;Patient/family education;Passive range of motion;Energy conservation;Joint Manipulations    PT Next Visit Plan assess 6MWT    Consulted and Agree with Plan of Care Patient           Patient will benefit from skilled therapeutic intervention in order to improve the following deficits and impairments:  Abnormal gait, Decreased activity tolerance, Decreased balance, Decreased endurance, Decreased range of motion, Decreased strength, Impaired flexibility, Impaired tone  Visit Diagnosis: Muscle weakness (generalized)  Other abnormalities of gait and mobility  Unsteadiness on feet  Hemiplegia and hemiparesis following cerebral infarction affecting right dominant side  [facility])     Problem List Patient Active Problem List   Diagnosis Date Noted  . 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 Partial obstruction of colon ([place])   . Generalized abdominal pain   . Abdominal bloating   . 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 Colonic mass 10/28/2019  . Hypokalemia 10/28/2019  . 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 Non-traumatic rotator cuff tear 09/21/2017  . Syncope and collapse 08/12/2013  . Back pain 08/12/2013  . CAD (coronary artery disease) 08/12/2013  . High blood pressure 03/15/2011  . GERD (gastroesophageal reflux disease) 03/15/2011  . Paronychia of great toe of left foot 03/15/2011    Kerrie Pleasure, PT 06/21/2020, 4:37 PM  [facility] [address] [place], [place], [address] Phone: [phone]   Fax:  [phone]  Name: Wesley [name] MRN: [id number] Date of Birth: [birth year]

## 2020-06-25 ENCOUNTER — Other Ambulatory Visit: Payer: Self-pay

## 2020-06-25 ENCOUNTER — Ambulatory Visit: Payer: Medicare Other

## 2020-06-25 DIAGNOSIS — M6281 Muscle weakness (generalized): Secondary | ICD-10-CM | POA: Diagnosis not present

## 2020-06-25 DIAGNOSIS — R2689 Other abnormalities of gait and mobility: Secondary | ICD-10-CM | POA: Diagnosis not present

## 2020-06-25 DIAGNOSIS — R2681 Unsteadiness on feet: Secondary | ICD-10-CM

## 2020-06-25 DIAGNOSIS — I69351 Hemiplegia and hemiparesis following cerebral infarction affecting right dominant side: Secondary | ICD-10-CM | POA: Diagnosis not present

## 2020-06-25 NOTE — Therapy (Signed)
[facility] [address] [place], [place], [address] Phone: [phone]   Fax:  [phone]  Physical Therapy Discharge [place]  Patient Details  Name: Wesley [name] MRN: [id number] Date of Birth: 12-26-1940 Referring Provider (PT): Dr. Sherrilyn Rist Date: 06/25/2020   PT End of Session - 06/25/20 1446    Visit Number 17    Number of Visits 17    Date for PT Re-Evaluation 06/01/20    Authorization - Visit Number 17    Progress Note Due on Visit 17    PT Start Time 1410    PT Stop Time 1440    PT Time Calculation (min) 30 min    Equipment Utilized During Treatment Gait belt    Activity Tolerance Patient tolerated treatment well    Behavior During Therapy [facility] for tasks assessed/performed           Past Medical History:  Diagnosis Date  . 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 CAD (coronary artery disease)   . Cholelithiasis   . Chronic allergic rhinitis   . Coronary atherosclerosis of native coronary artery   . Fatty liver   . GERD (gastroesophageal reflux disease)   . High blood pressure   . Hypercholesterolemia   . Macrocytosis without anemia   . OSA on CPAP   . Paraesophageal hernia   . Partial bowel obstruction ([place])   . 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 Sleep apnea   . Umbilical hernia without obstruction or gangrene     Past Surgical History:  Procedure Laterality Date  . BIOPSY  10/29/2019   Procedure: BIOPSY;  Surgeon: Jerene Bears, MD;  Location: [facility];  Service: Gastroenterology;;  . CARDIAC CATHETERIZATION  2000   Cath to rule out cardiac problems RT HTN. PT denies significant findings  . CARDIAC CATHETERIZATION  2008  . COLOSTOMY N/A 10/31/2019   Procedure: COLOSTOMY;  Surgeon: Erroll Luna, MD;  Location: [facility];  Service: General;  Laterality: N/A;  . CYSTOSCOPY W/ URETERAL STENT PLACEMENT Bilateral 10/31/2019    Procedure: CYSTOSCOPY URETERAL STENT PLACEMENT BILATERAL;  Surgeon: Franchot Gallo, MD;  Location: [facility];  Service: Urology;  Laterality: Bilateral;  . CYSTOSCOPY WITH STENT PLACEMENT Bilateral 03/11/2020   Procedure: CYSTOSCOPY WITH BILATERAL FIREFLY INJECTION;  Surgeon: Irine Seal, MD;  Location: [facility];  Service: Urology;  Laterality: Bilateral;  . EYE SURGERY     BILATERAL CATARACT SURGERY WITH LENS IMPLANTS  . FLEXIBLE SIGMOIDOSCOPY N/A 10/29/2019   Procedure: FLEXIBLE SIGMOIDOSCOPY;  Surgeon: Jerene Bears, MD;  Location: [facility];  Service: Gastroenterology;  Laterality: N/A;  . INSERTION OF MESH N/A 09/08/2016   Procedure: INSERTION OF MESH;  Surgeon: Jackolyn Confer, MD;  Location: [facility];  Service: General;  Laterality: N/A;  . LAPAROSCOPIC SIGMOID COLECTOMY N/A 10/31/2019   Procedure: DIAGNOSTIC LAPAROSCOPY; EXPLORATORY LAPAROTOMY; SIGMOID COLECTOMY;  Surgeon: Erroll Luna, MD;  Location: [facility];  Service: General;  Laterality: N/A;  . right rotator cuff    . ROTATOR CUFF REPAIR    . SUBMUCOSAL TATTOO INJECTION  10/29/2019   Procedure: SUBMUCOSAL TATTOO INJECTION;  Surgeon: Jerene Bears, MD;  Location: [facility];  Service: Gastroenterology;;  . TONSILLECTOMY    . UMBILICAL HERNIA REPAIR N/A 09/08/2016   Procedure: OPEN UMBILICAL HERNIA REPAIR WITH MESH;  Surgeon: Jackolyn Confer, MD;  Location: [facility];  Service: General;  Laterality: N/A;  . XI ROBOTIC ASSISTED  COLOSTOMY TAKEDOWN N/A 03/11/2020   Procedure: ROBOTIC ASSISTED COLOSTOMY REVERSAL, RIGID PROCTOSCOPY;  Surgeon: Leighton Ruff, MD;  Location: [facility];  Service: General;  Laterality: N/A;    There were no vitals filed for this visit.      Reviewed and demonstrated final HEP- marble pick up, seated DF, standing heel raises, standing heel raises with elbows on countertop, SLS, tandem walk Manually stretched toes into flexion, extension, seperation                          PT Short Term  Goals - 06/25/20 1430      PT SHORT TERM GOAL #1   Title Patient will be able to walk for 10 min with cane or no AD to improve walking endurance    Baseline walks around at home for <5 min before getting tired., patient walking for 10 - 12 mins at home    Time 4    Period Weeks    Status Achieved    Target Date 05/04/20      PT SHORT TERM GOAL #2   Title Patient will be able to perform 5x bil heel raises with 50lbs unweighted with biodex harness system to improve strength in bil plantarflexors    Baseline Currently 90-100lbs unweighted for pt to perform bil heel raises    Time 4    Period Weeks    Status Achieved    Target Date 06/07/20      PT SHORT TERM GOAL #3   Title Patient will demo 5 sec of SLS to improve dynamic balance when stepping over obstacles.    Baseline 2-3 secs;    Time 4    Period Weeks    Status Not Met    Target Date 06/07/20             PT Long Term Goals - 05/10/20 1840      PT LONG TERM GOAL #1   Title Patient will be able to walk on uneven grounds, grassy hill incline and decline for 200' without AD to improve gait on uneven grounds    Baseline pt has not attempted    Time 6    Period Weeks    Status New    Target Date 06/21/20      PT LONG TERM GOAL #2   Title Patient will be able to ambulate 1250 feet or more in 6 minutes to improve walking endurance without AD    Baseline 1045    Time 6    Period Weeks    Status New    Target Date 06/21/20      PT LONG TERM GOAL #3   Title Patient will be able to stand 30 seconds for all positions during modified CTSIB    Baseline TBD    Time 6    Period Weeks    Status New    Target Date 06/21/20                 Plan - 06/25/20 1445    Clinical Impression Statement Patient has been seen for total of 17 sessions from 04/08/20 to 06/25/20. Patient has met all of the short term goals and has met all of the long term goals except for the one for single leg balance. Patient is indepenent and  compliant with HEP to self manage his symptoms. Patient will be discharged from skilled PT.    Personal Factors and Comorbidities Comorbidity 2;Past/Current Experience;Time  since onset of injury/illness/exacerbation    Comorbidities CAD, HTN, HLD, OSA, BPH, GERD    Examination-Activity Limitations Stand;Squat    Examination-Participation Restrictions Yard Work;Community Activity;Cleaning    Stability/Clinical Decision Making Stable/Uncomplicated    Rehab Potential Good    PT Frequency --    PT Duration --    PT Treatment/Interventions Patient/family education    PT Next Visit Plan Discharge    Consulted and Agree with Plan of Care Patient           Patient will benefit from skilled therapeutic intervention in order to improve the following deficits and impairments:  Abnormal gait, Decreased activity tolerance, Decreased balance, Decreased endurance, Decreased range of motion, Decreased strength, Impaired flexibility, Impaired tone  Visit Diagnosis: Muscle weakness (generalized)  Other abnormalities of gait and mobility  Unsteadiness on feet     Problem List Patient Active Problem List   Diagnosis Date Noted  . 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 Partial obstruction of colon ([place])   . Generalized abdominal pain   . Abdominal bloating   . 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 Colonic mass 10/28/2019  . Hypokalemia 10/28/2019  . 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 Non-traumatic rotator cuff tear 09/21/2017  . Syncope and collapse 08/12/2013  . Back pain 08/12/2013  . CAD (coronary artery disease) 08/12/2013  . High blood pressure 03/15/2011  . GERD (gastroesophageal reflux disease) 03/15/2011  . Paronychia of great toe of left foot 03/15/2011    Kerrie Pleasure, PT 06/25/2020, 2:47 PM  [facility] [address] [place], [place], [address] Phone: [phone]   Fax:   [phone]  Name: EVANDER MACARAEG MRN: [id number] Date of Birth: 1940/09/23

## 2020-07-01 ENCOUNTER — Ambulatory Visit: Payer: Medicare Other | Admitting: Cardiology

## 2020-07-02 ENCOUNTER — Telehealth: Payer: Self-pay | Admitting: Internal Medicine

## 2020-07-02 NOTE — Telephone Encounter (Signed)
Pt states he had a colostomy last March and a reversal done in July. States he has a bulge on his side now and is worried he has something wrong with the colon. Pt worried that scar tissue could have built up causing an obstruction. Pt is eating, drinking, and going to the bathroom ok. Pt scheduled to see Amy Esterwood PA 07/05/20 at 2:30pm. Pt aware of appt.

## 2020-07-02 NOTE — Telephone Encounter (Signed)
Patient called and states he has a very large lump and he is not sure if its a hernia. He said he has issues with blockage and is seeking advise

## 2020-07-05 ENCOUNTER — Encounter: Payer: Self-pay | Admitting: Physician Assistant

## 2020-07-05 ENCOUNTER — Other Ambulatory Visit (INDEPENDENT_AMBULATORY_CARE_PROVIDER_SITE_OTHER): Payer: Medicare Other

## 2020-07-05 ENCOUNTER — Ambulatory Visit (INDEPENDENT_AMBULATORY_CARE_PROVIDER_SITE_OTHER): Payer: Medicare Other | Admitting: Physician Assistant

## 2020-07-05 VITALS — BP 128/82 | HR 88 | Ht 69.0 in | Wt 198.0 lb

## 2020-07-05 DIAGNOSIS — R140 Abdominal distension (gaseous): Secondary | ICD-10-CM

## 2020-07-05 DIAGNOSIS — Z9889 Other specified postprocedural states: Secondary | ICD-10-CM

## 2020-07-05 DIAGNOSIS — K439 Ventral hernia without obstruction or gangrene: Secondary | ICD-10-CM

## 2020-07-05 LAB — BASIC METABOLIC PANEL
BUN: 19 mg/dL (ref 6–23)
CO2: 25 mEq/L (ref 19–32)
Calcium: 9.3 mg/dL (ref 8.4–10.5)
Chloride: 101 mEq/L (ref 96–112)
Creatinine, Ser: 0.86 mg/dL (ref 0.40–1.50)
GFR: 82.83 mL/min (ref >60.00)
Glucose, Bld: 100 mg/dL — ABNORMAL HIGH (ref 70–99)
Potassium: 4 mEq/L (ref 3.5–5.1)
Sodium: 134 mEq/L — ABNORMAL LOW (ref 135–145)

## 2020-07-05 NOTE — Progress Notes (Signed)
Addendum: Reviewed and agree with assessment and management plan. Jusiah Aguayo M, MD  

## 2020-07-05 NOTE — Patient Instructions (Signed)
If you are age 78 or older, your body mass index should be between 23-30. Your Body mass index is 29.24 kg/m. If this is out of the aforementioned range listed, please consider follow up with your Primary Care Provider.  If you are age 66 or younger, your body mass index should be between 19-25. Your Body mass index is 29.24 kg/m. If this is out of the aformentioned range listed, please consider follow up with your Primary Care Provider.   You have been scheduled for a CT scan of the abdomen and pelvis at [facility], 1st floor Radiology. You are scheduled on 07/15/20  at 8:00 am. You should arrive 15 minutes prior to your appointment time for registration.  Please pick up 2 bottles of contrast from [facility] at least 3 days prior to your scan. The solution may taste better if refrigerated, but do NOT add ice or any other liquid to this solution. Shake well before drinking.   Please follow the written instructions below on the day of your exam:   1) Do not eat anything after 4:00 am (4 hours prior to your test)   2) Drink 1 bottle of contrast @ 6:00 am (2 hours prior to your exam)  Remember to shake well before drinking and do NOT pour over ice.     Drink 1 bottle of contrast @ 7:00 am (1 hour prior to your exam)   You may take any medications as prescribed with a small amount of water, if necessary. If you take any of the following medications: METFORMIN, GLUCOPHAGE, GLUCOVANCE, AVANDAMET, RIOMET, FORTAMET, [place] MET, JANUMET, GLUMETZA or METAGLIP, you MAY be asked to HOLD this medication 48 hours AFTER the exam.   The purpose of you drinking the oral contrast is to aid in the visualization of your intestinal tract. The contrast solution may cause some diarrhea. Depending on your individual set of symptoms, you may also receive an intravenous injection of x-ray contrast/dye. Plan on being at [facility] for 45 minutes or longer, depending on the type of exam you are having performed.    If you have any questions regarding your exam or if you need to reschedule, you may call [facility] at [phone] between the hours of 8:00 am and 5:00 pm, Monday-Friday.   Your provider has requested that you go to the basement level for lab work before leaving today. Press "B" on the elevator. The lab is located at the first door on the left as you exit the elevator.  Follow up pending the results of your CT.

## 2020-07-05 NOTE — Progress Notes (Signed)
Subjective:    Patient ID: Wesley [name], male    DOB: May 01, 1941, 79 y.o.   MRN: [id number]  HPI Wesley [name] is a pleasant 79 year old white male, known to Dr. Hilarie Fredrickson with history of hypertension, coronary artery disease, GERD. 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 He underwent colonoscopy June 2020 per Dr. Hilarie Fredrickson with finding of a patent anastomosis at the rectosigmoid junction, few rectosigmoid diverticuli, internal hemorrhoids were noted and patent colostomy.  There were multiple diverticuli from the ascending to descending colon, and 3 polyps were removed which were tubular adenomas.  No recall secondary to age. He then underwent colostomy reversal in July 2021 per Dr. Marcello Moores. He says since that time he has gained some weight but also has developed a protrusion and a full bloated feeling in his abdomen over the past couple of weeks. No significant abdominal pain.  Appetite has been good, no nausea or vomiting, bowel habits have been normal. He says he is not sure whether he has gained weight in his abdomen or whether he has another issue in his abdomen such as a hernia etc.  Review of Systems.Pertinent positive and negative review of systems were noted in the above HPI section.  All other review of systems was otherwise negative.  Outpatient Encounter Medications as of 07/05/2020  Medication Sig  . acetaminophen (TYLENOL) 500 MG tablet Take 2 tablets (1,000 mg total) by mouth every 8 (eight) hours as needed for mild pain or moderate pain.  Marland Kitchen atorvastatin (LIPITOR) 80 MG tablet Take 1 tablet (80 mg total) by mouth daily. Please keep upcoming appointment for future refills. Thank you  . cetirizine (ZYRTEC) 10 MG tablet Take 10 mg by mouth daily. ALLERTEC  AT 2 AM DAILY  . cholecalciferol (VITAMIN D3) 25 MCG (1000 UNIT) tablet Take 1,000 Units by mouth daily.  . Chromium Picolinate 1000 MCG TABS  Take 1,000 mcg by mouth daily.   . Cyanocobalamin (VITAMIN B-12) 5000 MCG TBDP Take 5,000 mcg by mouth daily.   . finasteride (PROSCAR) 5 MG tablet Take 5 mg by mouth every morning.   . fluticasone (FLONASE) 50 MCG/ACT nasal spray Place 2 sprays into both nostrils daily as needed for allergies or rhinitis.  Marland Kitchen lisinopril (PRINIVIL,ZESTRIL) 10 MG tablet Take 10 mg by mouth every morning.   . loratadine (CLARITIN) 10 MG tablet Take 10 mg by mouth daily.  . Magnesium 400 MG TABS Take 400 mg by mouth daily.   . Omega-3 Fatty Acids (FISH OIL) 1200 MG CAPS Take 1,200 mg by mouth daily.   . [DISCONTINUED] aspirin EC 81 MG tablet Take 81 mg by mouth every other day. Swallow whole.  . [DISCONTINUED] traMADol (ULTRAM) 50 MG tablet Take 1 tablet (50 mg total) by mouth every 6 (six) hours as needed for moderate pain or severe pain.   Facility-Administered Encounter Medications as of 07/05/2020  Medication  . 0.9 %  sodium chloride infusion   Allergies  Allergen Reactions  . Demerol Other (See Comments)    Causes dizziness  . Promethazine Hcl Other (See Comments)    Dizziness    Patient Active Problem List   Diagnosis Date Noted  . 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 Partial obstruction of colon ([place])   . Generalized abdominal pain   . Abdominal bloating   . 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 Colonic mass 10/28/2019  . Hypokalemia 10/28/2019  .  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 Non-traumatic rotator cuff tear 09/21/2017  . Syncope and collapse 08/12/2013  . Back pain 08/12/2013  . CAD (coronary artery disease) 08/12/2013  . High blood pressure 03/15/2011  . GERD (gastroesophageal reflux disease) 03/15/2011  . Paronychia of great toe of left foot 03/15/2011   Social History   Socioeconomic History  . Marital status: Married    Spouse name: Not on file  . Number of children: Not on file  . Years of education: Not on file  . Highest education  level: Not on file  Occupational History  . Not on file  Tobacco Use  . Smoking status: Never Smoker  . Smokeless tobacco: Never Used  Vaping Use  . Vaping Use: Never used  Substance and Sexual Activity  . Alcohol use: Yes    Alcohol/week: 14.0 standard drinks    Types: 14 drink(s) per week    Comment: or more  . Drug use: No  . Sexual activity: Not on file  Other Topics Concern  . Not on file  Social History Narrative  . Not on file   Social Determinants of Health   Financial Resource Strain:   . Difficulty of Paying Living Expenses: Not on file  Food Insecurity:   . Worried About Charity fundraiser in the Last Year: Not on file  . Ran Out of Food in the Last Year: Not on file  Transportation Needs:   . Lack of Transportation (Medical): Not on file  . Lack of Transportation (Non-Medical): Not on file  Physical Activity:   . Days of Exercise per Week: Not on file  . Minutes of Exercise per Session: Not on file  Stress:   . Feeling of Stress : Not on file  Social Connections:   . Frequency of Communication with Friends and Family: Not on file  . Frequency of Social Gatherings with Friends and Family: Not on file  . Attends Religious Services: Not on file  . Active Member of Clubs or Organizations: Not on file  . Attends Archivist Meetings: Not on file  . Marital Status: Not on file  Intimate Partner Violence:   . Fear of Current or Ex-Partner: Not on file  . Emotionally Abused: Not on file  . Physically Abused: Not on file  . Sexually Abused: Not on file    Wesley [name] family history includes CAD in his father; Diabetes in his mother; Healthy in his daughter and son; Melanoma (age of onset: 78) in his brother.      Objective:    Vitals:   07/05/20 1442  BP: 128/82  Pulse: 88    Physical Exam Well-developed well-nourished elderly white male in no acute distress.  Height, Weight 198, BMI 29.2  HEENT; nontraumatic normocephalic, EOMI, PER R LA,  sclera anicteric. Oropharynx; not examined Neck; supple, no JVD Cardiovascular; regular rate and rhythm with S1-S2, no murmur rub or gallop Pulmonary; Clear bilaterally Abdomen; soft, nontender, nondistended, no palpable mass or hepatosplenomegaly, bowel sounds are active.  He has a large midline ventral hernia supraumbilical, low midline incisional scar, and there also appears to be a hernia at prior colostomy site, no associated tenderness Rectal; not done today Skin; benign exam, no jaundice rash or appreciable lesions Extremities; no clubbing cyanosis or edema skin warm and dry Neuro/Psych; alert and oriented x4, grossly nonfocal mood and affect appropriate       Assessment & Plan:   #69 79 year old white male with  new development of a supraumbilical ventral hernia by exam and pericolostomy site hernia. 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 He underwent colostomy reversal July 2021  #2 history of adenomatous colon polyps-up-to-date with colonoscopy just done June 2021 prior to colostomy takedown no recall plan due to age  #3 cholelithiasis 4.  Fatty liver on prior CT 5.  Coronary artery disease 6.  GERD  Plan; we will schedule for CT of the abdomen and pelvis with contrast to document/delineate the ventral hernias. Patient relates that he does have follow-up with Dr. Ramirez/[facility] upcoming.  Any discussion regarding repair of the hernias can be had with Dr. Rosendo Gros at that time.  Wesley Kai Genia Harold PA-C 07/05/2020   Cc: Deland Pretty, MD

## 2020-07-15 ENCOUNTER — Encounter (HOSPITAL_COMMUNITY): Payer: Self-pay

## 2020-07-15 ENCOUNTER — Ambulatory Visit (HOSPITAL_COMMUNITY)
Admission: RE | Admit: 2020-07-15 | Discharge: 2020-07-15 | Disposition: A | Discharge status: Home / Self Care | Payer: Medicare Other | Source: Ambulatory Visit | Attending: Physician Assistant | Admitting: Physician Assistant

## 2020-07-15 ENCOUNTER — Other Ambulatory Visit: Payer: Self-pay

## 2020-07-15 DIAGNOSIS — R140 Abdominal distension (gaseous): Secondary | ICD-10-CM

## 2020-07-15 DIAGNOSIS — K439 Ventral hernia without obstruction or gangrene: Secondary | ICD-10-CM

## 2020-07-15 DIAGNOSIS — Z9889 Other specified postprocedural states: Secondary | ICD-10-CM

## 2020-07-15 IMAGING — CT CT ABD-PELV W/ CM
2 of 5 series · 15 of 46 positions shown, 17 images · IV contrast (ISOVUE 300)
Comparison: [DATE]

CLINICAL DATA: Abdominal distension.  Diverticulitis.

EXAM:
CT ABDOMEN AND PELVIS WITH CONTRAST
TECHNIQUE: Multidetector CT imaging of the abdomen and pelvis was performed
using the standard protocol following bolus administration of
intravenous contrast.
CONTRAST:  100mL OMNIPAQUE IOHEXOL 300 MG/ML  SOLN

[Series 2: axial st · axial · 0.97mm/px · z∈[+872,+1268]mm · 12 of 95 slices shown, 14 images]
[im 8/95  soft-tissue]
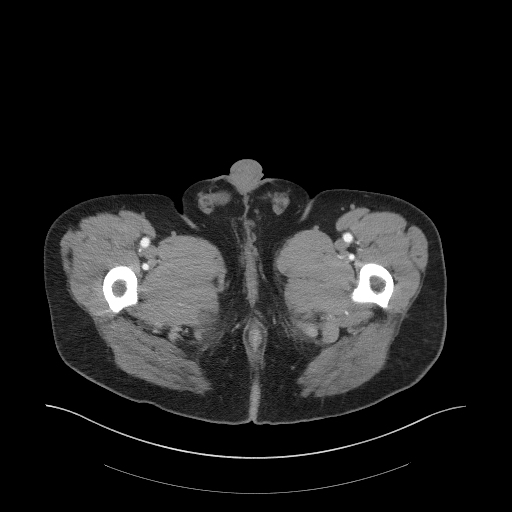
[im 8/95  bone]
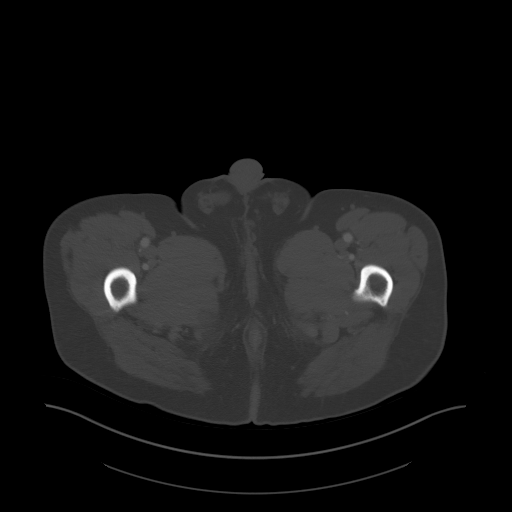
[im 15/95  soft-tissue]
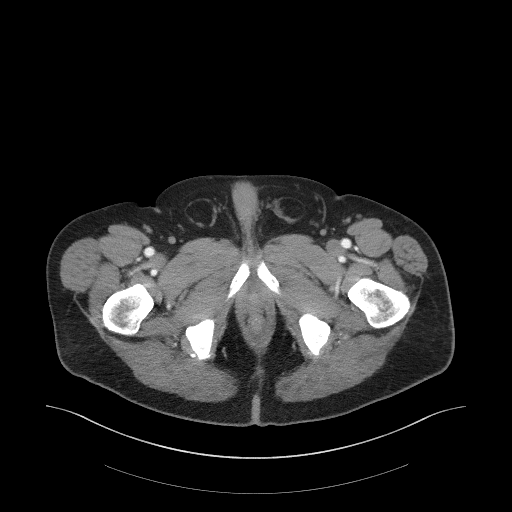
[im 22/95  soft-tissue]
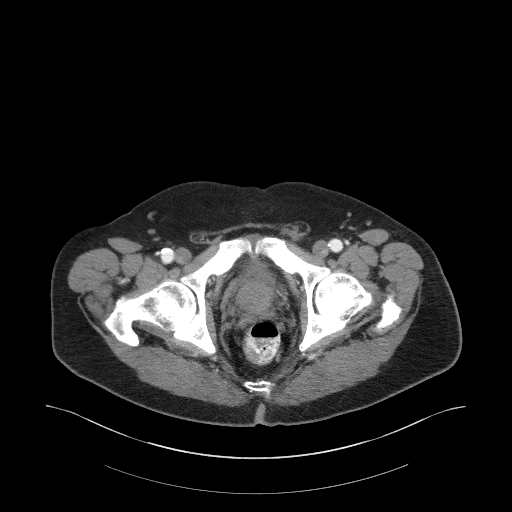
[im 29/95  soft-tissue]
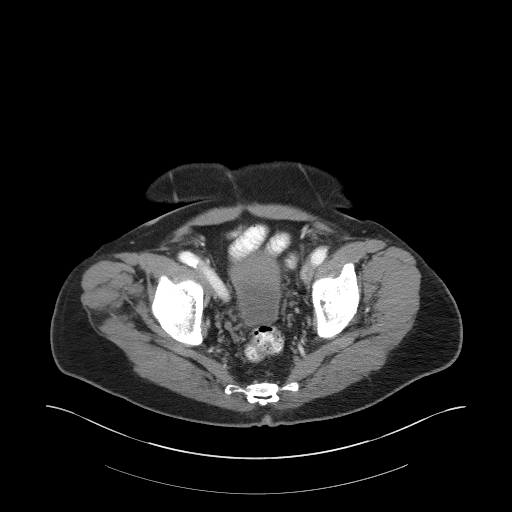
[im 37/95  soft-tissue]
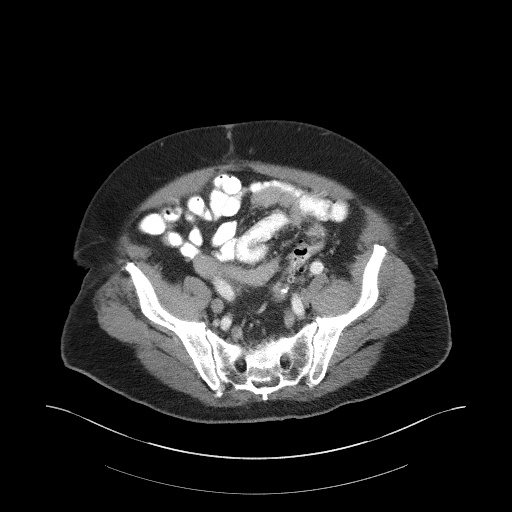
[im 44/95  soft-tissue]
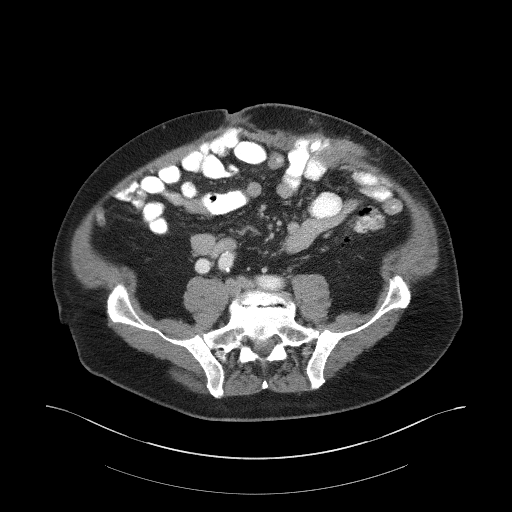
[im 51/95  soft-tissue]
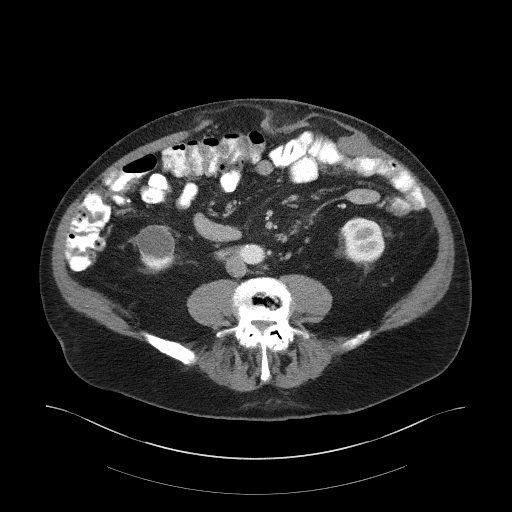
[im 58/95  soft-tissue]
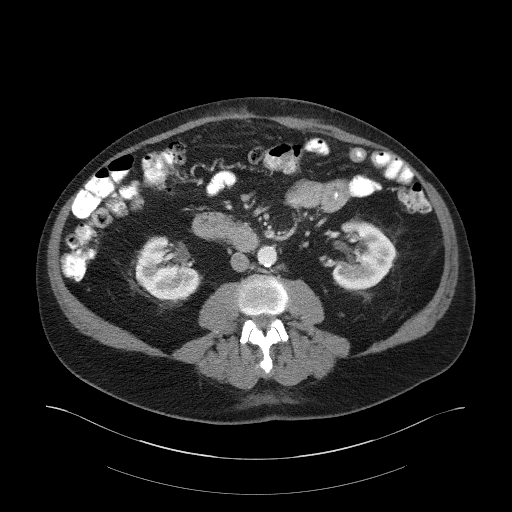
[im 66/95  soft-tissue]
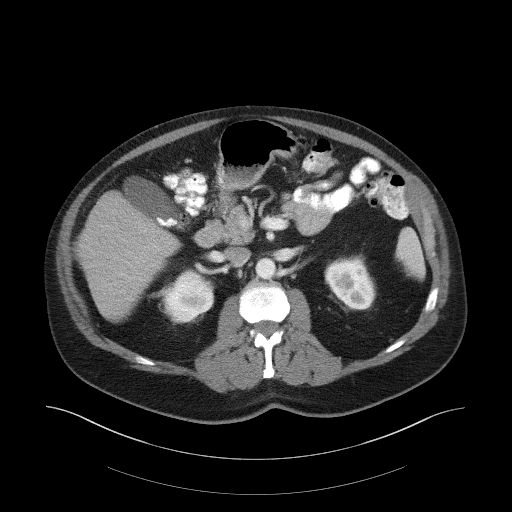
[im 66/95  bone]
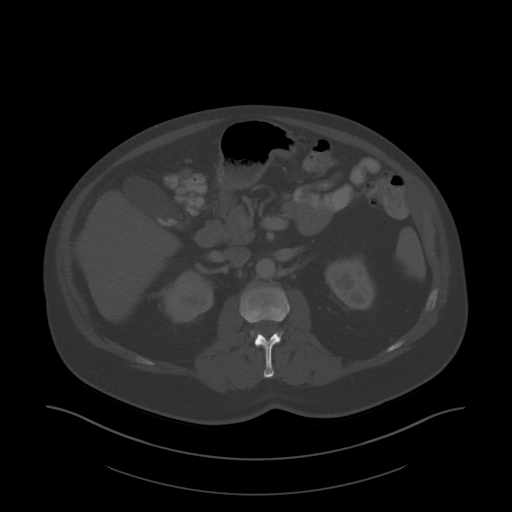
[im 73/95  soft-tissue]
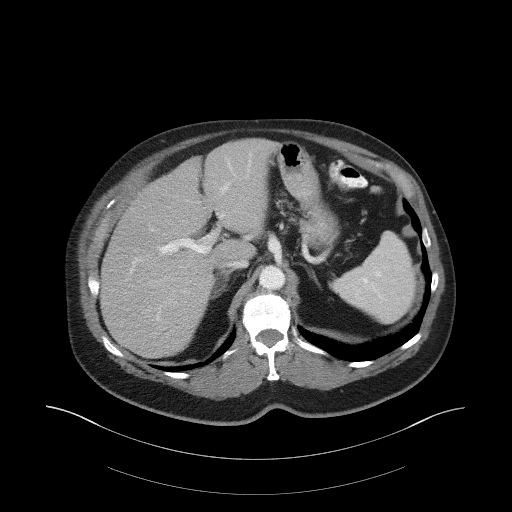
[im 80/95  soft-tissue]
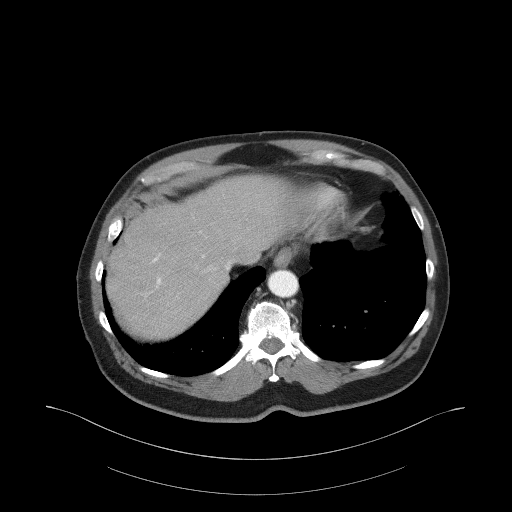
[im 87/95  soft-tissue]
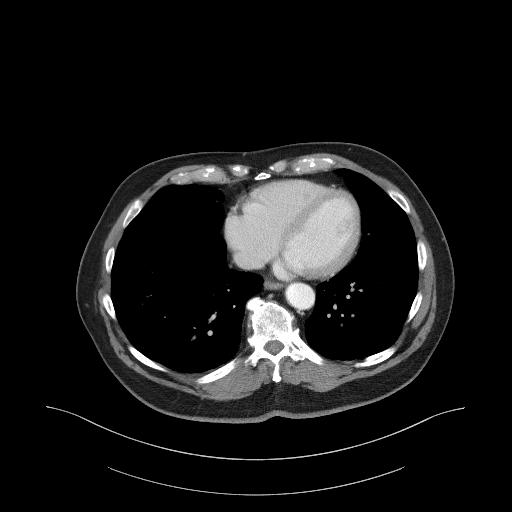

[Series 5: coronal st · coronal · 0.90mm/px · 3 of 117 slices shown]
[im 39/117  soft-tissue]
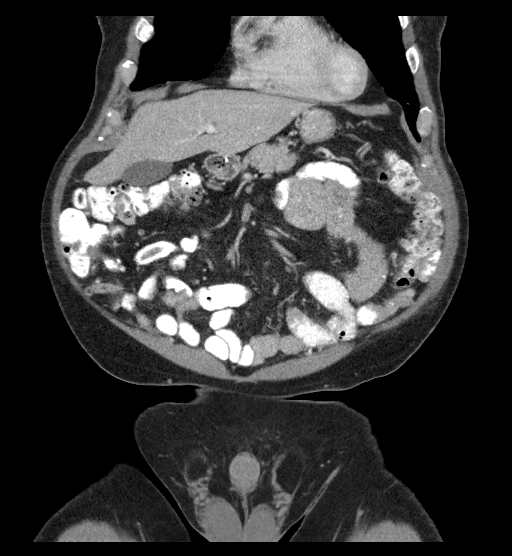
[im 52/117  soft-tissue]
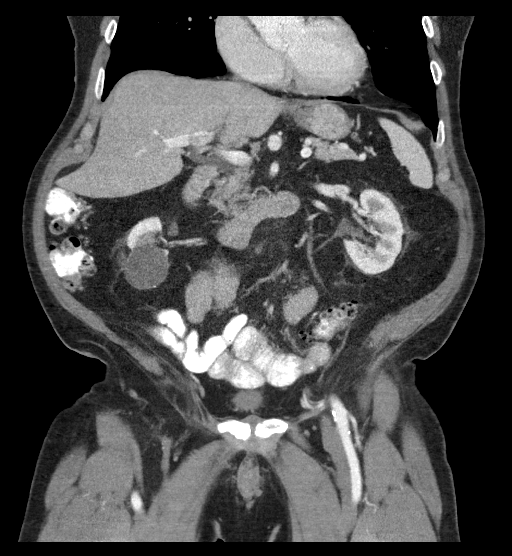
[im 65/117  soft-tissue]
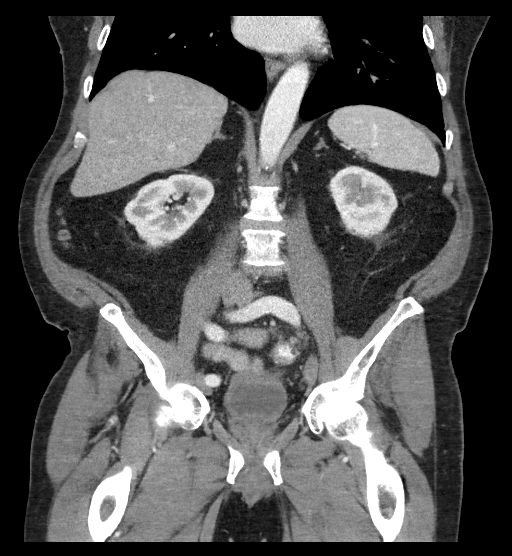

[15 of 46 positions shown; findings below may reference images not displayed]

FINDINGS: Lower chest: Mild linear subsegmental atelectasis or scarring in
both lower lobes. Descending thoracic aortic atherosclerosis.

Hepatobiliary: Numerous gallstones in the gallbladder measuring up
to about 1.0 cm in diameter. No gallbladder wall thickening. No
significant focal hepatic lesion. No biliary dilatation.

Pancreas: Unremarkable

Spleen: Unremarkable

Adrenals/Urinary Tract: Both adrenal glands appear normal. 4.2 by
3.0 cm cyst of the right kidney lower pole is again observed. No
hydronephrosis or hydroureter.

There are at least 3 punctate 1-2 mm nonobstructive right renal
calculi. Probable 1-2 mm left mid kidney nonobstructive renal
calculus.

Stomach/Bowel: Widespread colonic diverticulosis. Normal appendix.
Small diverticula of the terminal ileum. Anastomotic staple line in
the sigmoid colon. Orally administered contrast extends through to
the rectum. No current findings of active diverticulitis. No dilated
bowel.

Vascular/Lymphatic: Aortoiliac atherosclerotic vascular disease.
Stable appearance of the fusiform dilatation in the celiac artery.
No pathologic adenopathy.

Reproductive: Unremarkable

Other: No supplemental non-categorized findings.

Musculoskeletal: Broad right and left paracentral ventral hernias
primarily containing adipose tissue. The left-sided hernia contains
a small margin of the small bowel as shown on image 50 of series 2.

Grade 1 degenerative anterolisthesis at L4-5 with lower lumbar
spondylosis and degenerative disc disease causing foraminal
impingement at L4-5 and L5-S1.

Fatty spermatic cords bilaterally.
IMPRESSION: 1. Notable colonic diverticulosis without findings of active
diverticulitis. Anastomotic staple line in the sigmoid colon.
2. Other imaging findings of potential clinical significance:
Cholelithiasis. Bilateral nonobstructive nephrolithiasis. Broad
right and left paracentral ventral hernias primarily containing
adipose tissue. Lumbar spondylosis and degenerative disc disease
causing foraminal impingement at L4-5 and L5-S1. Fatty spermatic
cords bilaterally.
3. Aortic atherosclerosis.

Aortic Atherosclerosis ([DZ]-[DZ]).

## 2020-07-15 MED ORDER — IOHEXOL 300 MG/ML  SOLN
100.0000 mL | Freq: Once | INTRAMUSCULAR | Status: AC | PRN
  Administered 2020-07-15: 100 mL via INTRAVENOUS

## 2020-07-19 ENCOUNTER — Other Ambulatory Visit: Payer: Self-pay

## 2020-07-19 ENCOUNTER — Encounter: Payer: Self-pay | Admitting: Cardiology

## 2020-07-19 ENCOUNTER — Telehealth (INDEPENDENT_AMBULATORY_CARE_PROVIDER_SITE_OTHER): Payer: Medicare Other | Admitting: Cardiology

## 2020-07-19 ENCOUNTER — Telehealth: Payer: Self-pay | Admitting: *Deleted

## 2020-07-19 VITALS — BP 115/77 | HR 64 | Temp 97.0°F | Ht 69.0 in | Wt 194.0 lb

## 2020-07-19 DIAGNOSIS — I2510 Atherosclerotic heart disease of native coronary artery without angina pectoris: Secondary | ICD-10-CM | POA: Diagnosis not present

## 2020-07-19 DIAGNOSIS — I10 Essential (primary) hypertension: Secondary | ICD-10-CM

## 2020-07-19 DIAGNOSIS — I2584 Coronary atherosclerosis due to calcified coronary lesion: Secondary | ICD-10-CM

## 2020-07-19 DIAGNOSIS — E782 Mixed hyperlipidemia: Secondary | ICD-10-CM

## 2020-07-19 NOTE — Telephone Encounter (Signed)
Patient Consent for Virtual Visit   Wesley [name], you are scheduled for a virtual visit with your provider today.  Just as we do with appointments in the office, we must obtain your consent to participate.  Your consent will be active for this visit and any virtual visit you may have with one of our providers in the next 365 days.  If you have a MyChart account, I can also send a copy of this consent to you electronically.  All virtual visits are billed to your insurance company just like a traditional visit in the office.  As this is a virtual visit, video technology does not allow for your provider to perform a traditional examination.  This may limit your provider's ability to fully assess your condition.  If your provider identifies any concerns that need to be evaluated in person or the need to arrange testing such as labs, EKG, etc, we will make arrangements to do so.  Although advances in technology are sophisticated, we cannot ensure that it will always work on either your end or our end.  If the connection with a video visit is poor, we may have to switch to a telephone visit.  With either a video or telephone visit, we are not always able to ensure that we have a secure connection.   I need to obtain your verbal consent now.   Are you willing to proceed with your visit today?        :[id number]}  { {Click here.  Press F2 and choose YES or NO                  :Yes   CONSENT FOR VIRTUAL VISIT FOR:  Wesley [name]  By participating in this virtual visit I agree to the following:  I hereby voluntarily request, consent and authorize [facility] and its employed or contracted physicians, physician assistants, nurse practitioners or other licensed health care professionals (the Practitioner), to provide me with telemedicine health care services (the "Services") as deemed necessary by the treating Practitioner. I acknowledge and consent to receive the Services by the Practitioner via telemedicine. I  understand that the telemedicine visit will involve communicating with the Practitioner through live audiovisual communication technology and the disclosure of certain medical information by electronic transmission. I acknowledge that I have been given the opportunity to request an in-person assessment or other available alternative prior to the telemedicine visit and am voluntarily participating in the telemedicine visit.  I understand that I have the right to withhold or withdraw my consent to the use of telemedicine in the course of my care at any time, without affecting my right to future care or treatment, and that the Practitioner or I may terminate the telemedicine visit at any time. I understand that I have the right to inspect all information obtained and/or recorded in the course of the telemedicine visit and may receive copies of available information for a reasonable fee.  I understand that some of the potential risks of receiving the Services via telemedicine include:  Marland Kitchen Delay or interruption in medical evaluation due to technological equipment failure or disruption; . Information transmitted may not be sufficient (e.g. poor resolution of images) to allow for appropriate medical decision making by the Practitioner; and/or  . In rare instances, security protocols could fail, causing a breach of personal health information.  Furthermore, I acknowledge that it is my responsibility to provide information about my medical history, conditions and care that is complete  and accurate to the best of my ability. I acknowledge that Practitioner's advice, recommendations, and/or decision may be based on factors not within their control, such as incomplete or inaccurate data provided by me or distortions of diagnostic images or specimens that may result from electronic transmissions. I understand that the practice of medicine is not an exact science and that Practitioner makes no warranties or guarantees  regarding treatment outcomes. I acknowledge that a copy of this consent can be made available to me via my patient portal ([facility]), or I can request a printed copy by calling the office of [facility].    I understand that my insurance will be billed for this visit.   I have read or had this consent read to me. . I understand the contents of this consent, which adequately explains the benefits and risks of the Services being provided via telemedicine.  . I have been provided ample opportunity to ask questions regarding this consent and the Services and have had my questions answered to my satisfaction. . I give my informed consent for the services to be provided through the use of telemedicine in my medical care

## 2020-07-19 NOTE — Patient Instructions (Signed)
Medication Instructions:  The current medical regimen is effective;  continue present plan and medications.  *If you need a refill on your cardiac medications before your next appointment, please call your pharmacy*  Follow-Up: At [facility], you and your health needs are our priority.  As part of our continuing mission to provide you with exceptional heart care, we have created designated Provider Care Teams.  These Care Teams include your primary Cardiologist (physician) and Advanced Practice Providers (APPs -  Physician Assistants and Nurse Practitioners) who all work together to provide you with the care you need, when you need it.  We recommend signing up for the patient portal called "MyChart".  Sign up information is provided on this After Visit Summary.  MyChart is used to connect with patients for Virtual Visits (Telemedicine).  Patients are able to view lab/test results, encounter notes, upcoming appointments, etc.  Non-urgent messages can be sent to your provider as well.   To learn more about what you can do with MyChart, go to [email or web address].    Your next appointment:   1 year  The format for your next appointment:   Either virtual or in person  Provider:   Dr Candee Furbish  Thank you for choosing [facility]!!

## 2020-07-19 NOTE — Progress Notes (Signed)
Virtual Visit via Video Note   This visit type was conducted due to national recommendations for restrictions regarding the COVID-19 Pandemic (e.g. social distancing) in an effort to limit this patient's exposure and mitigate transmission in our community.  Due to his co-morbid illnesses, this patient is at least at moderate risk for complications without adequate follow up.  This format is felt to be most appropriate for this patient at this time.  All issues noted in this document were discussed and addressed.  A limited physical exam was performed with this format.  Please refer to the patient's chart for his consent to telehealth for [facility].       Date:  07/19/2020   ID:  Wesley [name], DOB 1941/08/11, MRN [id number] The patient was identified using 2 identifiers.  Patient Location: Home Provider Location: Home Office  PCP:  Deland Pretty, MD  Cardiologist:  Candee Furbish, MD  Electrophysiologist:  None   Evaluation Performed:  Follow-Up Visit  Chief Complaint:  CAD  History of Present Illness:    Wesley [name] is a 79 y.o. male here for the follow-up of abnormal EKG, coronary artery calcification/atherosclerosis, aortic atherosclerosis.  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  Underwent sigmoidectomy and colostomy.  Awaiting Dr. Rosendo Gros visit.  Underwent colostomy reversal July 2021 with Dr. Marcello Moores.  Has had some issues with hernia.  Cardiac catheterization 2008: No flow-limiting CAD Echocardiogram 2015-mild LVH with EF 65% CT scan 2015-coronary artery calcification but no aneurysm Nuclear stress test 2017-no ischemia low risk  Family history strong with father dying at age 67 of coronary disease Non-smoker  He has been on aggressive statin therapy.  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, Engineer, production.  Last year riding his bike on his porch. But now walking.  Trying to regain  some strength, leg weakness when he was quite sedentary.  With his walking, he is able to achieve greater than 4 METS of activity without any anginal discomfort.  Felt well without any chest pain or shortness of breath.  The patient does not have symptoms concerning for COVID-19 infection (fever, chills, cough, or new shortness of breath).    Past Medical History:  Diagnosis Date  . 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 CAD (coronary artery disease)   . Cholelithiasis   . Chronic allergic rhinitis   . Coronary atherosclerosis of native coronary artery   . Fatty liver   . GERD (gastroesophageal reflux disease)   . High blood pressure   . Hypercholesterolemia   . Macrocytosis without anemia   . OSA on CPAP   . Paraesophageal hernia   . Partial bowel obstruction ([place])   . 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 Sleep apnea   . Umbilical hernia without obstruction or gangrene    Past Surgical History:  Procedure Laterality Date  . BIOPSY  10/29/2019   Procedure: BIOPSY;  Surgeon: Jerene Bears, MD;  Location: [facility];  Service: Gastroenterology;;  . CARDIAC CATHETERIZATION  2000   Cath to rule out cardiac problems RT HTN. PT denies significant findings  . CARDIAC CATHETERIZATION  2008  . COLOSTOMY N/A 10/31/2019   Procedure: COLOSTOMY;  Surgeon: Erroll Luna, MD;  Location: [facility];  Service: General;  Laterality: N/A;  . CYSTOSCOPY W/ URETERAL STENT PLACEMENT Bilateral 10/31/2019   Procedure: CYSTOSCOPY URETERAL STENT PLACEMENT BILATERAL;  Surgeon:  Franchot Gallo, MD;  Location: [facility];  Service: Urology;  Laterality: Bilateral;  . CYSTOSCOPY WITH STENT PLACEMENT Bilateral 03/11/2020   Procedure: CYSTOSCOPY WITH BILATERAL FIREFLY INJECTION;  Surgeon: Irine Seal, MD;  Location: [facility];  Service: Urology;  Laterality: Bilateral;  . EYE SURGERY     BILATERAL CATARACT SURGERY WITH LENS IMPLANTS  . FLEXIBLE SIGMOIDOSCOPY N/A  10/29/2019   Procedure: FLEXIBLE SIGMOIDOSCOPY;  Surgeon: Jerene Bears, MD;  Location: [facility];  Service: Gastroenterology;  Laterality: N/A;  . INSERTION OF MESH N/A 09/08/2016   Procedure: INSERTION OF MESH;  Surgeon: Jackolyn Confer, MD;  Location: [facility];  Service: General;  Laterality: N/A;  . LAPAROSCOPIC SIGMOID COLECTOMY N/A 10/31/2019   Procedure: DIAGNOSTIC LAPAROSCOPY; EXPLORATORY LAPAROTOMY; SIGMOID COLECTOMY;  Surgeon: Erroll Luna, MD;  Location: [facility];  Service: General;  Laterality: N/A;  . right rotator cuff    . ROTATOR CUFF REPAIR    . SUBMUCOSAL TATTOO INJECTION  10/29/2019   Procedure: SUBMUCOSAL TATTOO INJECTION;  Surgeon: Jerene Bears, MD;  Location: [facility];  Service: Gastroenterology;;  . TONSILLECTOMY    . UMBILICAL HERNIA REPAIR N/A 09/08/2016   Procedure: OPEN UMBILICAL HERNIA REPAIR WITH MESH;  Surgeon: Jackolyn Confer, MD;  Location: [facility];  Service: General;  Laterality: N/A;  . XI ROBOTIC ASSISTED COLOSTOMY TAKEDOWN N/A 03/11/2020   Procedure: ROBOTIC ASSISTED COLOSTOMY REVERSAL, RIGID PROCTOSCOPY;  Surgeon: Leighton Ruff, MD;  Location: [facility];  Service: General;  Laterality: N/A;     Current Meds  Medication Sig  . atorvastatin (LIPITOR) 80 MG tablet Take 1 tablet (80 mg total) by mouth daily. Please keep upcoming appointment for future refills. Thank you  . cetirizine (ZYRTEC) 10 MG tablet Take 10 mg by mouth daily. ALLERTEC  AT 2 AM DAILY  . cholecalciferol (VITAMIN D3) 25 MCG (1000 UNIT) tablet Take 1,000 Units by mouth daily.  . Chromium Picolinate 1000 MCG TABS Take 1,000 mcg by mouth daily.   . Cyanocobalamin (VITAMIN B-12) 5000 MCG TBDP Take 5,000 mcg by mouth daily.   . finasteride (PROSCAR) 5 MG tablet Take 5 mg by mouth every morning.   . fluticasone (FLONASE) 50 MCG/ACT nasal spray Place 2 sprays into both nostrils daily as needed for allergies or rhinitis.  Marland Kitchen ibuprofen (ADVIL) 200 MG tablet Take 400 mg by mouth as needed.  Marland Kitchen  lisinopril (PRINIVIL,ZESTRIL) 10 MG tablet Take 10 mg by mouth every morning.   . Magnesium 400 MG TABS Take 400 mg by mouth daily.   . Omega-3 Fatty Acids (FISH OIL) 1200 MG CAPS Take 1,200 mg by mouth daily.    Current Facility-Administered Medications for the 07/19/20 encounter (Video Visit) with Jerline Pain, MD  Medication  . 0.9 %  sodium chloride infusion     Allergies:   Demerol and Promethazine hcl   Social History   Tobacco Use  . Smoking status: Never Smoker  . Smokeless tobacco: Never Used  Vaping Use  . Vaping Use: Never used  Substance Use Topics  . Alcohol use: Yes    Alcohol/week: 14.0 standard drinks    Types: 14 drink(s) per week    Comment: or more  . Drug use: No     Family Hx: The patient's family history includes CAD in his father; Diabetes in his mother; Healthy in his daughter and son; Melanoma (age of onset: 59) in his brother. There is no history of Colon cancer, Esophageal cancer, Rectal cancer, or Stomach cancer.  ROS:  Please see the history of present illness.    No fevers chills nausea vomiting syncope bleeding All other systems reviewed and are negative.   Prior CV studies:   The following studies were reviewed today:  As above  Labs/Other Tests and Data Reviewed:    EKG:  03/01/2020-sinus rhythm first-degree AV block, 218 ms with PVC  Recent Labs: 11/02/2019: ALT 21 03/14/2020: Hemoglobin 13.0; Platelets 182 07/05/2020: BUN 19; Creatinine, Ser 0.86; Potassium 4.0; Sodium 134   Recent Lipid Panel No results found for: CHOL, TRIG, HDL, CHOLHDL, LDLCALC, LDLDIRECT  Wt Readings from Last 3 Encounters:  07/19/20 194 lb (88 kg)  07/05/20 198 lb (89.8 kg)  03/14/20 191 lb 8 oz (86.9 kg)     Risk Assessment/Calculations:      Objective:    Vital Signs:  BP 115/77   Pulse 64   Temp (!) 97 F (36.1 C)   Ht 5\' 9"  (1.753 m)   Wt 194 lb (88 kg)   BMI 28.65 kg/m    VITAL SIGNS:  reviewed GEN:  no acute distress EYES:  sclerae  anicteric, EOMI - Extraocular Movements Intact RESPIRATORY:  normal respiratory effort, symmetric expansion SKIN:  no rash, lesions or ulcers. MUSCULOSKELETAL:  no obvious deformities. NEURO:  alert and oriented x 3, no obvious focal deficit PSYCH:  normal affect  ASSESSMENT & PLAN:     Coronary artery calcification/atherosclerosis/aortic atherosclerosis -Previously showed him CT scan, diffuse LAD calcification, mild proximal circumflex calcification.  Diffuse coronary atherosclerosis. - Prior heart catheterization in 2008 was reportedly normal.  Nuclear stress test 2017 low risk with no ischemia. - Agree with aggressive secondary prevention, add back aspirin 81 mg once he is able to do so from a surgical perspective.   Colostomy, sigmoid colectomy - was a blockage diverticula, inflamed. Blockage. Seeing surgeon, Dr. Rosendo Gros , soon about hernia repair.  GI notes reviewed.  Hyperlipidemia -Continue with high intensity statin.  Goal LDL less than 70.  Previously was on Zetia but stopped.  No myalgias.  Dr. Shelia Media has been monitoring his blood work, lipids.  Essential HTN  -Continue with optimal control.  Medications reviewed as above, lisinopril with no change in medication management  Abnormal EKG/first-degree AV block/PVC -Benign, no higher symptoms.  Preoperative cardiac risk -If he is going to pursue further abdominal surgery, hernia repair for instance, he may proceed with low overall cardiac risk.  Able to achieve greater than 4 METS of activity without difficulty.  Prior stress test low risk.   Shared Decision Making/Informed Consent        COVID-19 Education: The signs and symptoms of COVID-19 were discussed with the patient and how to seek care for testing (follow up with PCP or arrange E-visit).  The importance of social distancing was discussed today.  Time:   Today, I have spent 21 minutes with the patient with telehealth technology discussing the above  problems, review of medical records, data, GI notes, prior cardiac catheterization echocardiogram as well as stress test.     Medication Adjustments/Labs and Tests Ordered: Current medicines are reviewed at length with the patient today.  Concerns regarding medicines are outlined above.   Tests Ordered: No orders of the defined types were placed in this encounter.   Medication Changes: No orders of the defined types were placed in this encounter.   Follow Up:  Virtual Visit  in 1 year(s)  Signed, Candee Furbish, MD  07/19/2020 10:40 AM    [place]

## 2020-07-23 ENCOUNTER — Other Ambulatory Visit: Payer: Self-pay | Admitting: General Surgery

## 2020-07-23 DIAGNOSIS — R109 Unspecified abdominal pain: Secondary | ICD-10-CM

## 2020-07-28 ENCOUNTER — Ambulatory Visit: Payer: Self-pay | Admitting: General Surgery

## 2020-07-28 DIAGNOSIS — K432 Incisional hernia without obstruction or gangrene: Secondary | ICD-10-CM | POA: Diagnosis not present

## 2020-07-28 NOTE — H&P (Signed)
  History of Present Illness [name] MD; 07/28/2020 11:08 AM) The patient is a 79 year old male who presents with an incisional hernia. Patient is a 79 year old male, who comes in status post colostomy colostomy reversal. Patient has had a upper midline incisional hernia as well as a incisional hernia at the previous colostomy site. Patient states he noticed a bulge the ostomy site several months after surgery. He states that he does have some discomfort and pain. He does feel that the colostomy site hernia has gotten larger. Patient had a previous midline incisional hernia 10+ years ago with mesh.  Patient did have CT scan which revealed both an upper midline as well as a left colostomy incisional hernia. Did review this personally discussed with the patient. Patient is otherwise active. He's had no signs or symptoms of incarceration or granulation.    Allergies (Diane Herrin, RN; 07/28/2020 10:51 AM) Demerol *ANALGESICS - OPIOID*  Phenergan *ANTIHISTAMINES*  Promethazine HCl *ANTIHISTAMINES*  Allergies Reconciled   Medication History (Diane Herrin, RN; 07/28/2020 10:51 AM) Ibuprofen (200MG  Capsule, Oral) Active. Finasteride (5MG  Tablet, Oral) Active. Flonase (50MCG/ACT Suspension, Nasal as needed) Active. Lipitor (80MG  Tablet, Oral) Active. Lisinopril (10MG  Tablet, Oral) Active. Vitamin D (50000UNIT Capsule, Oral) Active. Omega 3 (1200MG  Capsule, Oral) Active. Claritin (10MG  Capsule, Oral as needed) Active. Medications Reconciled    Review of Systems [name], MD; 07/28/2020 11:9 AM) All other systems negative  Vitals (Diane Herrin RN; 07/28/2020 10:52 AM) 07/28/2020 10:51 AM Weight: 202.25 lb Height: 69in Body Surface Area: 2.08 m Body Mass Index: 29.87 kg/m  Temp.: 98.62F  Pulse: 97 (Regular)  P.OX: 97% (Room air) BP: 166/84(Sitting, Left Arm, Standard)       Physical Exam [name] MD; 07/28/2020 11:08 AM) The  physical exam findings are as follows: Note: Constitutional: No acute distress, conversant, appears stated age  Eyes: Anicteric sclerae, moist conjunctiva, no lid lag  Neck: No thyromegaly, trachea midline, no cervical lymphadenopathy  Lungs: Clear to auscultation biilaterally, normal respiratory effot  Cardiovascular: regular rate & rhythm, no murmurs, no peripheal edema, pedal pulses 2+  GI: Soft, no masses or hepatosplenomegaly, non-tender to palpation  MSK: Normal gait, no clubbing cyanosis, edema  Skin: No rashes, palpation reveals normal skin turgor  Psychiatric: Appropriate judgment and insight, oriented to person, place, and time  Abdomen Inspection Hernias - Incisional - Reducible (Upper midline and colostomy site incisional hernia) .    Assessment & Plan [name] MD; 07/28/2020 11:09 AM) Fatima Blank HERNIA, WITHOUT OBSTRUCTION OR GANGRENE (K43.2) Impression: Patient is a 79 year old male, with a upper midline and colostomy site incisional hernia. 1. The patient will like to proceed to the operating room for open incisional hernia repair with mesh  2. I discussed with the patient the signs and symptoms of incarceration and strangulation and the need to proceed to the ER should they occur.  3. I discussed with the patient the risks and benefits of the procedure to include but not limited to: Infection, bleeding, damage to surrounding structures, possible need for further surgery, possible nerve pain, and possible recurrence. The patient was understanding and wishes to proceed.

## 2020-08-13 ENCOUNTER — Other Ambulatory Visit: Payer: Self-pay | Admitting: Cardiology

## 2020-09-02 ENCOUNTER — Inpatient Hospital Stay (HOSPITAL_COMMUNITY): Admission: RE | Admit: 2020-09-02 | Payer: Medicare Other | Source: Ambulatory Visit

## 2020-09-03 ENCOUNTER — Ambulatory Visit: Payer: Medicare Other | Admitting: Family Medicine

## 2020-09-06 ENCOUNTER — Other Ambulatory Visit (HOSPITAL_COMMUNITY): Payer: Medicare Other

## 2020-09-07 DIAGNOSIS — G4733 Obstructive sleep apnea (adult) (pediatric): Secondary | ICD-10-CM | POA: Diagnosis not present

## 2020-10-05 NOTE — Pre-Procedure Instructions (Signed)
Wesley [name]  10/05/2020    Your procedure is scheduled on Thurs., October 14, 2020 from 11:45AM-2:00PM  Report to [facility] Entrance "A" at 9:45AM  Call this number if you have problems the morning of surgery:  [phone]   Remember:  Do not eat after midnight on March 2nd  You may drink clear liquids until 3 hours (8:45AM) prior to surgery time .  Clear liquids allowed are: Water, Juice(non-citric, no pulp), Black Coffee(no dairy or creamer), Clear Tea(no dairy or creamer), Carbonated Beverages, Gatorade, Plain Jell-O, Plain Popsicles.  Please complete your PRE-SURGERY ENSURE drinks that was provided to you, drink 2 bottles by (10:30PM) the night before and 1 bottle by (8:45AM) the morning of surgery.  Please, if able, drink it in one setting. DO NOT SIP.     Take these medicines the morning of surgery with A SIP OF WATER: Atorvastatin (LIPITOR) Fexofenadine (ALLEGRA) Finasteride (PROSCAR)  If Needed: Fluticasone (FLONASE)  7 days before surgery(10/07/20), STOP taking all Aspirin (unless instructed by your doctor) and Other Aspirin containing products, Vitamins, Fish oils, and Herbal medications. Also stop all NSAIDS i.e. Advil, Ibuprofen, Motrin, Aleve, Anaprox, Naproxen, BC, Goody Powders, and all Supplements.   No Smoking of any kind, Tobacco/Vaping, or Alcohol products 24 hours prior to your procedure. If you use a Cpap at night, you may bring all equipment for your overnight stay.    Day of Surgery:  Do not wear jewelry.  Do not wear lotions, powders, colognes, or deodorant.  Do not shave 48 hours prior to surgery.  Men may shave face and neck.  Do not bring valuables to the hospital.  [facility] is not responsible for any belongings or valuables.  Contacts, dentures or bridgework may not be worn into surgery.   For patients admitted to the hospital, discharge time will be determined by your treatment team.  Patients discharged the day of surgery will  not be allowed to drive home, and someone age 23 and over needs to stay with them for 24 hours.  Special instructions:  [facility]- Preparing For Surgery  Before surgery, you can play an important role. Because skin is not sterile, your skin needs to be as free of germs as possible. You can reduce the number of germs on your skin by washing with CHG (chlorahexidine gluconate) Soap before surgery.  CHG is an antiseptic cleaner which kills germs and bonds with the skin to continue killing germs even after washing.    Oral Hygiene is also important to reduce your risk of infection.  Remember - BRUSH YOUR TEETH THE MORNING OF SURGERY WITH YOUR REGULAR TOOTHPASTE  Please do not use if you have an allergy to CHG or antibacterial soaps. If your skin becomes reddened/irritated stop using the CHG.  Do not shave (including legs and underarms) for at least 48 hours prior to first CHG shower. It is OK to shave your face.  Please follow these instructions carefully.   1. Shower the NIGHT BEFORE SURGERY and the MORNING OF SURGERY with CHG.   2. If you chose to wash your hair, wash your hair first as usual with your normal shampoo.  3. After you shampoo, rinse your hair and body thoroughly to remove the shampoo.  4. Use CHG as you would any other liquid soap. You can apply CHG directly to the skin and wash gently with a scrungie or a clean washcloth.   5. Apply the CHG Soap to your body ONLY  FROM THE NECK DOWN.  Do not use on open wounds or open sores. Avoid contact with your eyes, ears, mouth and genitals (private parts). Wash Face and genitals (private parts)  with your normal soap.  6. Wash thoroughly, paying special attention to the area where your surgery will be performed.  7. Thoroughly rinse your body with warm water from the neck down.  8. DO NOT shower/wash with your normal soap after using and rinsing off the CHG Soap.  9. Pat yourself dry with a CLEAN TOWEL.  10. Wear CLEAN PAJAMAS to  bed the night before surgery, wear comfortable clothes the morning of surgery  11. Place CLEAN SHEETS on your bed the night of your first shower and DO NOT SLEEP WITH PETS.  Reminders: Do not apply any deodorants/lotions.  Please wear clean clothes to the hospital/surgery center.   Remember to brush your teeth WITH YOUR REGULAR TOOTHPASTE.  Please read over the following fact sheets that you were given.

## 2020-10-06 ENCOUNTER — Encounter (HOSPITAL_COMMUNITY)
Admission: RE | Admit: 2020-10-06 | Discharge: 2020-10-06 | Disposition: A | Discharge status: Home / Self Care | Payer: Medicare Other | Source: Ambulatory Visit | Attending: General Surgery | Admitting: General Surgery

## 2020-10-06 ENCOUNTER — Other Ambulatory Visit: Payer: Self-pay

## 2020-10-06 ENCOUNTER — Encounter (HOSPITAL_COMMUNITY): Payer: Self-pay

## 2020-10-06 DIAGNOSIS — Z01812 Encounter for preprocedural laboratory examination: Secondary | ICD-10-CM | POA: Insufficient documentation

## 2020-10-06 LAB — COMPREHENSIVE METABOLIC PANEL
ALT: 35 U/L (ref 0–44)
AST: 30 U/L (ref 15–41)
Albumin: 3.5 g/dL (ref 3.5–5.0)
Alkaline Phosphatase: 81 U/L (ref 38–126)
Anion gap: 8 (ref 5–15)
BUN: 15 mg/dL (ref 8–23)
CO2: 25 mmol/L (ref 22–32)
Calcium: 9.1 mg/dL (ref 8.9–10.3)
Chloride: 102 mmol/L (ref 98–111)
Creatinine, Ser: 0.95 mg/dL (ref 0.61–1.24)
GFR, Estimated: >60 mL/min (ref >60)
Glucose, Bld: 113 mg/dL — ABNORMAL HIGH (ref 70–99)
Potassium: 4.4 mmol/L (ref 3.5–5.1)
Sodium: 135 mmol/L (ref 135–145)
Total Bilirubin: 1.1 mg/dL (ref 0.3–1.2)
Total Protein: 9.1 g/dL — ABNORMAL HIGH (ref 6.5–8.1)

## 2020-10-06 LAB — CBC
HCT: 42.6 % (ref 39.0–52.0)
Hemoglobin: 13.9 g/dL (ref 13.0–17.0)
MCH: 33.3 pg (ref 26.0–34.0)
MCHC: 32.6 g/dL (ref 30.0–36.0)
MCV: 101.9 fL — ABNORMAL HIGH (ref 80.0–100.0)
Platelets: 191 10*3/uL (ref 150–400)
RBC: 4.18 MIL/uL — ABNORMAL LOW (ref 4.22–5.81)
RDW: 14.3 % (ref 11.5–15.5)
WBC: 6 10*3/uL (ref 4.0–10.5)
nRBC: 0 % (ref 0.0–0.2)

## 2020-10-06 NOTE — Progress Notes (Signed)
PCP - Deland Pretty Cardiologist - Dr. Marlou Porch  Chest x-ray - n/a EKG - 7/19 (CE) Stress Test - 04/11/16 (CE) ECHO - 08/13/13 (CE) Cardiac Cath - 2008  SA - yes, wears CPAP   ERAS Protcol - yes, given (3) Ensure   COVID TEST- 10/11/20   Anesthesia review: yes, heart history  Patient denies shortness of breath, fever, cough and chest pain at PAT appointment   All instructions explained to the patient, with a verbal understanding of the material. Patient agrees to go over the instructions while at home for a better understanding. Patient also instructed to self quarantine after being tested for COVID-19. The opportunity to ask questions was provided.

## 2020-10-06 NOTE — Pre-Procedure Instructions (Addendum)
Wesley [name]  10/06/2020    Your procedure is scheduled on Wednesday., October 13, 2020 from 11:45AM-2:00PM  Report to [facility] Entrance "A" at 9:45AM  Call this number if you have problems the morning of surgery:  [phone]   Remember:  Do not eat after midnight on March 1st  You may drink clear liquids until 3 hours (8:45AM) prior to surgery time .   Clear liquids allowed are: Water, Juice(non-citric, no pulp), Black Coffee(no dairy or creamer), Clear Tea(no dairy or creamer), Carbonated Beverages, Gatorade, Plain Jell-O, Plain Popsicles.  Please complete your PRE-SURGERY ENSURE drinks that was provided to you, drink 2 bottles by (10:30PM) the night before and 1 bottle by (8:45AM) the morning of surgery.  Please, if able, drink it in one setting. DO NOT SIP.     Take these medicines the morning of surgery with A SIP OF WATER:  Atorvastatin (LIPITOR)  Fexofenadine (ALLEGRA)  Finasteride (PROSCAR)   If Needed:  Fluticasone (FLONASE)  7 days before surgery(10/07/20), STOP taking all Aspirin (unless instructed by your doctor) and Other Aspirin containing products, Vitamins, Fish oils, and Herbal medications. Also stop all NSAIDS i.e. Advil, Ibuprofen, Motrin, Aleve, Anaprox, Naproxen, BC, Goody Powders, and all Supplements.   No Smoking of any kind, Tobacco/Vaping, or Alcohol products 24 hours prior to your procedure. If you use a Cpap at night, you may bring all equipment for your overnight stay.    Day of Surgery:  Do not wear jewelry.  Do not wear lotions, powders, colognes, or deodorant.  Men may shave face and neck.  Do not bring valuables to the hospital.  [facility] is not responsible for any belongings or valuables.  Contacts, dentures or bridgework may not be worn into surgery.   For patients admitted to the hospital, discharge time will be determined by your treatment team.  Patients discharged the day of surgery will not be allowed to drive home,  and someone age 53 and over needs to stay with them for 24 hours.  Special instructions:  [facility]- Preparing For Surgery  Before surgery, you can play an important role. Because skin is not sterile, your skin needs to be as free of germs as possible. You can reduce the number of germs on your skin by washing with CHG (chlorahexidine gluconate) Soap before surgery.  CHG is an antiseptic cleaner which kills germs and bonds with the skin to continue killing germs even after washing.    Oral Hygiene is also important to reduce your risk of infection.  Remember - BRUSH YOUR TEETH THE MORNING OF SURGERY WITH YOUR REGULAR TOOTHPASTE  Please do not use if you have an allergy to CHG or antibacterial soaps. If your skin becomes reddened/irritated stop using the CHG.  Do not shave (including legs and underarms) for at least 48 hours prior to first CHG shower. It is OK to shave your face.  Please follow these instructions carefully.   1. Shower the NIGHT BEFORE SURGERY and the MORNING OF SURGERY with CHG.   2. If you chose to wash your hair, wash your hair first as usual with your normal shampoo.  3. After you shampoo, rinse your hair and body thoroughly to remove the shampoo.  4. Use CHG as you would any other liquid soap. You can apply CHG directly to the skin and wash gently with a scrungie or a clean washcloth.   5. Apply the CHG Soap to your body ONLY FROM THE NECK  DOWN.  Do not use on open wounds or open sores. Avoid contact with your eyes, ears, mouth and genitals (private parts). Wash Face and genitals (private parts)  with your normal soap.  6. Wash thoroughly, paying special attention to the area where your surgery will be performed.  7. Thoroughly rinse your body with warm water from the neck down.  8. DO NOT shower/wash with your normal soap after using and rinsing off the CHG Soap.  9. Pat yourself dry with a CLEAN TOWEL.  10. Wear CLEAN PAJAMAS to bed the night before surgery,  wear comfortable clothes the morning of surgery  11. Place CLEAN SHEETS on your bed the night of your first shower and DO NOT SLEEP WITH PETS.  Reminders: Do not apply any deodorants/lotions.  Please wear clean clothes to the hospital/surgery center.   Remember to brush your teeth WITH YOUR REGULAR TOOTHPASTE.  Please read over the following fact sheets that you were given.

## 2020-10-07 NOTE — Progress Notes (Signed)
Anesthesia Chart Review:  Follows with cardiology for history of coronary artery calcification/atherosclerosis/aortic atherosclerosis.  CT scan showed diffuse LAD calcification, mild proximal circumflex calcification, diffuse coronary atherosclerosis.  Remote heart cath 2008 was reportedly normal, he had a nuclear stress in 2017 which was low risk, no ischemia.  Last seen by Dr. Marlou Porch 07/19/2020.  Recommended continue aggressive secondary prevention.  Discussed that he will be seeing Dr. Rosendo Gros for hernia repair.  Per note, "If he is going to pursue further abdominal surgery, hernia repair for instance, he may proceed with low overall cardiac risk.  Able to achieve greater than 4 METS of activity without difficulty.  Prior stress test low risk."  OSA on CPAP.  Preop labs reviewed, unremarkable.  EKG 03/01/2020: Sinus rhythm with 1st degree A-V block with occasional Premature ventricular complexes.  Rate 86. No significant change since last tracing  Nuclear stress 04/11/2016:  Nuclear stress EF: 68%.  There was no ST segment deviation noted during stress.  The study is normal. no ischemia. no evidence of previous MI  This is a low risk study.  TTE 08/13/2013: - Left ventricle: The cavity size was normal. Wall thickness  was increased in a pattern of mild LVH. Systolic function  was normal. The estimated ejection fraction was in the  range of 60% to 65%. Wall motion was normal; there were no  regional wall motion abnormalities. Features are  consistent with a pseudonormal left ventricular filling  pattern, with concomitant abnormal relaxation and  increased filling pressure (grade 2 diastolic  dysfunction).  - Aortic valve: There was no stenosis.  - Aorta: Mildly dilated aortic root. Aortic root dimension:  29mm (ED).  - Mitral valve: No significant regurgitation.  - Right ventricle: The cavity size was normal. Systolic  function was normal.  - Pulmonary arteries:  No complete TR doppler jet so unable  to estimate PA systolic pressure.  - Inferior vena cava: The vessel was normal in size; the  respirophasic diameter changes were in the normal range (=  50%); findings are consistent with normal central venous  pressure.  Impressions:   - Normal LV size with mild LV hypertrophy. EF 60-65%.  Moderate diastolic dysfunction. Normal RV size and  systolic function. No significant valvular abnormalities.    Wynonia Musty [facility] Short Stay Center/Anesthesiology Phone [phone] 10/07/2020 2:21 PM

## 2020-10-07 NOTE — Anesthesia Preprocedure Evaluation (Addendum)
Anesthesia Evaluation  Patient identified by MRN, date of birth, ID band Patient awake    Reviewed: Allergy & Precautions, NPO status , Patient's Chart, lab work & pertinent test results  History of Anesthesia Complications (+) PROLONGED EMERGENCENegative for: history of anesthetic complications  Airway Mallampati: III  TM Distance: >3 FB Neck ROM: Full    Dental  (+) Dental Advisory Given, Teeth Intact   Pulmonary sleep apnea and Continuous Positive Airway Pressure Ventilation ,    Pulmonary exam normal        Cardiovascular hypertension, Pt. on medications + CAD  Normal cardiovascular exam     Neuro/Psych negative neurological ROS  negative psych ROS   GI/Hepatic Neg liver ROS, hiatal hernia, GERD  Controlled,  Endo/Other  negative endocrine ROS  Renal/GU negative Renal ROS     Musculoskeletal  (+) Arthritis ,   Abdominal   Peds  Hematology negative hematology ROS (+)   Anesthesia Other Findings Covid test negative See PAT note  Reproductive/Obstetrics                          Anesthesia Physical Anesthesia Plan  ASA: III  Anesthesia Plan: General   Post-op Pain Management:    Induction: Intravenous  PONV Risk Score and Plan: 2 and Treatment may vary due to age or medical condition, Ondansetron and Dexamethasone  Airway Management Planned: Oral ETT  Additional Equipment: None  Intra-op Plan:   Post-operative Plan: Extubation in OR  Informed Consent: I have reviewed the patients History and Physical, chart, labs and discussed the procedure including the risks, benefits and alternatives for the proposed anesthesia with the patient or authorized representative who has indicated his/her understanding and acceptance.     Dental advisory given  Plan Discussed with: CRNA and Anesthesiologist  Anesthesia Plan Comments:       Anesthesia Quick Evaluation

## 2020-10-11 ENCOUNTER — Other Ambulatory Visit (HOSPITAL_COMMUNITY)
Admission: RE | Admit: 2020-10-11 | Discharge: 2020-10-11 | Disposition: A | Discharge status: Home / Self Care | Payer: Medicare Other | Source: Ambulatory Visit | Attending: General Surgery | Admitting: General Surgery

## 2020-10-11 DIAGNOSIS — Z01812 Encounter for preprocedural laboratory examination: Secondary | ICD-10-CM | POA: Insufficient documentation

## 2020-10-11 DIAGNOSIS — Z20822 Contact with and (suspected) exposure to covid-19: Secondary | ICD-10-CM | POA: Insufficient documentation

## 2020-10-12 LAB — SARS CORONAVIRUS 2 (TAT 6-24 HRS): SARS Coronavirus 2: NEGATIVE

## 2020-10-13 ENCOUNTER — Inpatient Hospital Stay (HOSPITAL_COMMUNITY)
Admission: RE | Admit: 2020-10-13 | Discharge: 2020-10-15 | DRG: 355 | Disposition: A | Discharge status: Home / Self Care | Payer: Medicare Other | Attending: General Surgery | Admitting: General Surgery

## 2020-10-13 ENCOUNTER — Encounter (HOSPITAL_COMMUNITY)
Admission: RE | Disposition: A | Discharge status: Home / Self Care | Payer: Self-pay | Source: Home / Self Care | Attending: General Surgery

## 2020-10-13 ENCOUNTER — Other Ambulatory Visit: Payer: Self-pay

## 2020-10-13 ENCOUNTER — Inpatient Hospital Stay (HOSPITAL_COMMUNITY): Payer: Medicare Other | Admitting: Certified Registered Nurse Anesthetist

## 2020-10-13 ENCOUNTER — Inpatient Hospital Stay (HOSPITAL_COMMUNITY): Payer: Medicare Other | Admitting: Physician Assistant

## 2020-10-13 ENCOUNTER — Encounter (HOSPITAL_COMMUNITY): Payer: Self-pay | Admitting: General Surgery

## 2020-10-13 DIAGNOSIS — G4730 Sleep apnea, unspecified: Secondary | ICD-10-CM | POA: Diagnosis not present

## 2020-10-13 DIAGNOSIS — Z20822 Contact with and (suspected) exposure to covid-19: Secondary | ICD-10-CM | POA: Diagnosis present

## 2020-10-13 DIAGNOSIS — K432 Incisional hernia without obstruction or gangrene: Secondary | ICD-10-CM | POA: Diagnosis not present

## 2020-10-13 DIAGNOSIS — K219 Gastro-esophageal reflux disease without esophagitis: Secondary | ICD-10-CM | POA: Diagnosis not present

## 2020-10-13 DIAGNOSIS — I10 Essential (primary) hypertension: Secondary | ICD-10-CM | POA: Diagnosis not present

## 2020-10-13 DIAGNOSIS — Z9889 Other specified postprocedural states: Secondary | ICD-10-CM

## 2020-10-13 DIAGNOSIS — I2510 Atherosclerotic heart disease of native coronary artery without angina pectoris: Secondary | ICD-10-CM | POA: Diagnosis not present

## 2020-10-13 DIAGNOSIS — K435 Parastomal hernia without obstruction or  gangrene: Secondary | ICD-10-CM | POA: Diagnosis not present

## 2020-10-13 DIAGNOSIS — Z8719 Personal history of other diseases of the digestive system: Secondary | ICD-10-CM

## 2020-10-13 DIAGNOSIS — E7800 Pure hypercholesterolemia, unspecified: Secondary | ICD-10-CM | POA: Diagnosis not present

## 2020-10-13 DIAGNOSIS — E876 Hypokalemia: Secondary | ICD-10-CM | POA: Diagnosis not present

## 2020-10-13 HISTORY — PX: INCISIONAL HERNIA REPAIR: SHX193

## 2020-10-13 SURGERY — REPAIR, HERNIA, INCISIONAL
Anesthesia: General

## 2020-10-13 MED ORDER — CHLORHEXIDINE GLUCONATE CLOTH 2 % EX PADS: 6.0000 | MEDICATED_PAD | Freq: Once | CUTANEOUS | Status: DC

## 2020-10-13 MED ORDER — ESMOLOL HCL 100 MG/10ML IV SOLN
INTRAVENOUS | Status: DC | PRN
  Administered 2020-10-13: 30 mg via INTRAVENOUS

## 2020-10-13 MED ORDER — GABAPENTIN 300 MG PO CAPS
300.0000 mg | ORAL_CAPSULE | ORAL | Status: AC
  Administered 2020-10-13: 300 mg via ORAL
  Filled 2020-10-13: qty 1

## 2020-10-13 MED ORDER — ACETAMINOPHEN 500 MG PO TABS
1000.0000 mg | ORAL_TABLET | ORAL | Status: AC
  Administered 2020-10-13: 1000 mg via ORAL
  Filled 2020-10-13: qty 2

## 2020-10-13 MED ORDER — ENSURE PRE-SURGERY PO LIQD: 296.0000 mL | Freq: Once | ORAL | Status: DC

## 2020-10-13 MED ORDER — DEXMEDETOMIDINE (PRECEDEX) IN NS 20 MCG/5ML (4 MCG/ML) IV SYRINGE
PREFILLED_SYRINGE | INTRAVENOUS | Status: DC | PRN
  Administered 2020-10-13: 8 ug via INTRAVENOUS
  Administered 2020-10-13: 4 ug via INTRAVENOUS

## 2020-10-13 MED ORDER — OXYCODONE HCL 5 MG PO TABS: 5.0000 mg | ORAL_TABLET | Freq: Once | ORAL | Status: DC | PRN

## 2020-10-13 MED ORDER — DEXMEDETOMIDINE (PRECEDEX) IN NS 20 MCG/5ML (4 MCG/ML) IV SYRINGE
PREFILLED_SYRINGE | INTRAVENOUS | Status: AC
  Filled 2020-10-13: qty 5

## 2020-10-13 MED ORDER — SUGAMMADEX SODIUM 200 MG/2ML IV SOLN
INTRAVENOUS | Status: DC | PRN
  Administered 2020-10-13: 200 mg via INTRAVENOUS

## 2020-10-13 MED ORDER — 0.9 % SODIUM CHLORIDE (POUR BTL) OPTIME
TOPICAL | Status: DC | PRN
  Administered 2020-10-13: 1000 mL

## 2020-10-13 MED ORDER — DEXAMETHASONE SODIUM PHOSPHATE 10 MG/ML IJ SOLN
INTRAMUSCULAR | Status: DC | PRN
  Administered 2020-10-13: 5 mg via INTRAVENOUS

## 2020-10-13 MED ORDER — ACETAMINOPHEN 10 MG/ML IV SOLN
1000.0000 mg | Freq: Four times a day (QID) | INTRAVENOUS | Status: AC
  Administered 2020-10-13 – 2020-10-14 (×3): 1000 mg via INTRAVENOUS
  Filled 2020-10-13 (×3): qty 100

## 2020-10-13 MED ORDER — TRAMADOL HCL 50 MG PO TABS
50.0000 mg | ORAL_TABLET | Freq: Four times a day (QID) | ORAL | Status: DC | PRN
  Administered 2020-10-14 – 2020-10-15 (×2): 50 mg via ORAL
  Filled 2020-10-13 (×2): qty 1

## 2020-10-13 MED ORDER — PROPOFOL 10 MG/ML IV BOLUS
INTRAVENOUS | Status: AC
  Filled 2020-10-13: qty 40

## 2020-10-13 MED ORDER — CEFAZOLIN SODIUM-DEXTROSE 2-4 GM/100ML-% IV SOLN
2.0000 g | INTRAVENOUS | Status: AC
  Administered 2020-10-13: 2 g via INTRAVENOUS
  Filled 2020-10-13: qty 100

## 2020-10-13 MED ORDER — FENTANYL CITRATE (PF) 250 MCG/5ML IJ SOLN
INTRAMUSCULAR | Status: AC
  Filled 2020-10-13: qty 5

## 2020-10-13 MED ORDER — OXYCODONE HCL 5 MG/5ML PO SOLN: 5.0000 mg | Freq: Once | ORAL | Status: DC | PRN

## 2020-10-13 MED ORDER — ONDANSETRON HCL 4 MG/2ML IJ SOLN: 4.0000 mg | Freq: Four times a day (QID) | INTRAMUSCULAR | Status: DC | PRN

## 2020-10-13 MED ORDER — ONDANSETRON HCL 4 MG/2ML IJ SOLN: 4.0000 mg | Freq: Once | INTRAMUSCULAR | Status: DC | PRN

## 2020-10-13 MED ORDER — HYDROMORPHONE HCL 1 MG/ML IJ SOLN
1.0000 mg | INTRAMUSCULAR | Status: DC | PRN
  Administered 2020-10-13 – 2020-10-15 (×6): 1 mg via INTRAVENOUS
  Filled 2020-10-13 (×7): qty 1

## 2020-10-13 MED ORDER — KETAMINE HCL 10 MG/ML IJ SOLN
INTRAMUSCULAR | Status: DC | PRN
  Administered 2020-10-13 (×2): 10 mg via INTRAVENOUS
  Administered 2020-10-13: 30 mg via INTRAVENOUS

## 2020-10-13 MED ORDER — LISINOPRIL 10 MG PO TABS
10.0000 mg | ORAL_TABLET | Freq: Every morning | ORAL | Status: DC
Start: 2020-10-14 — End: 2020-10-15
  Administered 2020-10-14 – 2020-10-15 (×2): 10 mg via ORAL
  Filled 2020-10-13 (×2): qty 1

## 2020-10-13 MED ORDER — GLYCOPYRROLATE PF 0.2 MG/ML IJ SOSY
PREFILLED_SYRINGE | INTRAMUSCULAR | Status: AC
  Filled 2020-10-13: qty 1

## 2020-10-13 MED ORDER — ONDANSETRON HCL 4 MG/2ML IJ SOLN
INTRAMUSCULAR | Status: AC
  Filled 2020-10-13: qty 2

## 2020-10-13 MED ORDER — DEXTROSE-NACL 5-0.9 % IV SOLN: INTRAVENOUS | Status: DC

## 2020-10-13 MED ORDER — SODIUM CHLORIDE 0.9 % IV SOLN
INTRAVENOUS | Status: DC | PRN
  Administered 2020-10-13: 20 mL

## 2020-10-13 MED ORDER — CHLORHEXIDINE GLUCONATE 0.12 % MT SOLN
15.0000 mL | Freq: Once | OROMUCOSAL | Status: AC
  Administered 2020-10-13: 15 mL via OROMUCOSAL
  Filled 2020-10-13: qty 15

## 2020-10-13 MED ORDER — GLYCOPYRROLATE PF 0.2 MG/ML IJ SOSY
PREFILLED_SYRINGE | INTRAMUSCULAR | Status: DC | PRN
  Administered 2020-10-13: .2 mg via INTRAVENOUS

## 2020-10-13 MED ORDER — BUPIVACAINE HCL (PF) 0.25 % IJ SOLN
INTRAMUSCULAR | Status: AC
  Filled 2020-10-13: qty 30

## 2020-10-13 MED ORDER — ONDANSETRON HCL 4 MG/2ML IJ SOLN
INTRAMUSCULAR | Status: DC | PRN
  Administered 2020-10-13: 4 mg via INTRAVENOUS

## 2020-10-13 MED ORDER — LIDOCAINE 2% (20 MG/ML) 5 ML SYRINGE
INTRAMUSCULAR | Status: DC | PRN
  Administered 2020-10-13: 60 mg via INTRAVENOUS

## 2020-10-13 MED ORDER — FENTANYL CITRATE (PF) 250 MCG/5ML IJ SOLN
INTRAMUSCULAR | Status: DC | PRN
  Administered 2020-10-13 (×6): 50 ug via INTRAVENOUS

## 2020-10-13 MED ORDER — FENTANYL CITRATE (PF) 100 MCG/2ML IJ SOLN
INTRAMUSCULAR | Status: AC
  Filled 2020-10-13: qty 2

## 2020-10-13 MED ORDER — FENTANYL CITRATE (PF) 100 MCG/2ML IJ SOLN
25.0000 ug | INTRAMUSCULAR | Status: DC | PRN
  Administered 2020-10-13 (×4): 25 ug via INTRAVENOUS

## 2020-10-13 MED ORDER — ORAL CARE MOUTH RINSE: 15.0000 mL | Freq: Once | OROMUCOSAL | Status: AC

## 2020-10-13 MED ORDER — PROPOFOL 10 MG/ML IV BOLUS
INTRAVENOUS | Status: DC | PRN
  Administered 2020-10-13: 20 mg via INTRAVENOUS
  Administered 2020-10-13: 50 mg via INTRAVENOUS
  Administered 2020-10-13: 110 mg via INTRAVENOUS
  Administered 2020-10-13: 20 mg via INTRAVENOUS
  Administered 2020-10-13: 30 mg via INTRAVENOUS

## 2020-10-13 MED ORDER — LACTATED RINGERS IV SOLN: INTRAVENOUS | Status: DC

## 2020-10-13 MED ORDER — ESMOLOL HCL 100 MG/10ML IV SOLN
INTRAVENOUS | Status: AC
  Filled 2020-10-13: qty 10

## 2020-10-13 MED ORDER — ROCURONIUM BROMIDE 100 MG/10ML IV SOLN
INTRAVENOUS | Status: DC | PRN
  Administered 2020-10-13: 50 mg via INTRAVENOUS
  Administered 2020-10-13: 20 mg via INTRAVENOUS

## 2020-10-13 MED ORDER — KETAMINE HCL 50 MG/5ML IJ SOSY
PREFILLED_SYRINGE | INTRAMUSCULAR | Status: AC
  Filled 2020-10-13: qty 5

## 2020-10-13 MED ORDER — DIAZEPAM 5 MG PO TABS: 5.0000 mg | ORAL_TABLET | Freq: Four times a day (QID) | ORAL | Status: DC | PRN

## 2020-10-13 MED ORDER — ONDANSETRON 4 MG PO TBDP: 4.0000 mg | ORAL_TABLET | Freq: Four times a day (QID) | ORAL | Status: DC | PRN

## 2020-10-13 MED ORDER — PHENYLEPHRINE HCL (PRESSORS) 10 MG/ML IV SOLN
INTRAVENOUS | Status: DC | PRN
  Administered 2020-10-13: 80 ug via INTRAVENOUS

## 2020-10-13 MED ORDER — VISTASEAL 10 ML SINGLE DOSE KIT
PACK | CUTANEOUS | Status: DC | PRN
  Administered 2020-10-13: 10 mL via TOPICAL

## 2020-10-13 MED ORDER — ENSURE PRE-SURGERY PO LIQD: 592.0000 mL | Freq: Once | ORAL | Status: DC

## 2020-10-13 MED ORDER — LIDOCAINE 2% (20 MG/ML) 5 ML SYRINGE
INTRAMUSCULAR | Status: AC
  Filled 2020-10-13: qty 5

## 2020-10-13 MED ORDER — PHENYLEPHRINE HCL-NACL 10-0.9 MG/250ML-% IV SOLN
INTRAVENOUS | Status: DC | PRN
  Administered 2020-10-13: 10 ug/min via INTRAVENOUS
  Administered 2020-10-13: 20 ug/min via INTRAVENOUS

## 2020-10-13 MED ORDER — BUPIVACAINE LIPOSOME 1.3 % IJ SUSP
20.0000 mL | Freq: Once | INTRAMUSCULAR | Status: DC
  Filled 2020-10-13 (×2): qty 20

## 2020-10-13 MED ORDER — ROCURONIUM BROMIDE 10 MG/ML (PF) SYRINGE
PREFILLED_SYRINGE | INTRAVENOUS | Status: AC
  Filled 2020-10-13: qty 10

## 2020-10-13 MED ORDER — SODIUM CHLORIDE (PF) 0.9 % IJ SOLN
INTRAMUSCULAR | Status: AC
  Filled 2020-10-13: qty 20

## 2020-10-13 SURGICAL SUPPLY — 51 items
ADH SKN CLS LQ APL DERMABOND (GAUZE/BANDAGES/DRESSINGS) ×1
BINDER ABDOMINAL 12 ML 46-62 (SOFTGOODS) ×2 IMPLANT
BLADE CLIPPER SURG (BLADE) ×2 IMPLANT
BLADE SURG 11 STRL SS (BLADE) ×2 IMPLANT
COVER SURGICAL LIGHT HANDLE (MISCELLANEOUS) ×2 IMPLANT
COVER WAND RF STERILE (DRAPES) ×2 IMPLANT
DERMABOND ADHESIVE PROPEN (GAUZE/BANDAGES/DRESSINGS) ×1
DERMABOND ADVANCED .7 DNX6 (GAUZE/BANDAGES/DRESSINGS) ×1 IMPLANT
DEVICE TROCAR PUNCTURE CLOSURE (ENDOMECHANICALS) ×2 IMPLANT
DRAIN CHANNEL 19F RND (DRAIN) IMPLANT
DRAPE INCISE IOBAN 66X45 STRL (DRAPES) ×2 IMPLANT
DRAPE LAPAROSCOPIC ABDOMINAL (DRAPES) ×2 IMPLANT
DRSG OPSITE POSTOP 4X8 (GAUZE/BANDAGES/DRESSINGS) ×2 IMPLANT
ELECT REM PT RETURN 9FT ADLT (ELECTROSURGICAL) ×2
ELECTRODE REM PT RTRN 9FT ADLT (ELECTROSURGICAL) ×1 IMPLANT
EVACUATOR SILICONE 100CC (DRAIN) IMPLANT
GLOVE BIO SURGEON STRL SZ7.5 (GLOVE) ×2 IMPLANT
GLOVE SRG 8 PF TXTR STRL LF DI (GLOVE) ×1 IMPLANT
GLOVE SURG SS PI 7.5 STRL IVOR (GLOVE) ×2 IMPLANT
GLOVE SURG UNDER POLY LF SZ8 (GLOVE) ×2
GOWN STRL REUS W/ TWL LRG LVL3 (GOWN DISPOSABLE) ×1 IMPLANT
GOWN STRL REUS W/ TWL XL LVL3 (GOWN DISPOSABLE) ×1 IMPLANT
GOWN STRL REUS W/TWL LRG LVL3 (GOWN DISPOSABLE) ×2
GOWN STRL REUS W/TWL XL LVL3 (GOWN DISPOSABLE) ×2
HANDLE SUCTION POOLE (INSTRUMENTS) ×1 IMPLANT
KIT BASIN OR (CUSTOM PROCEDURE TRAY) ×2 IMPLANT
KIT TURNOVER KIT B (KITS) ×2 IMPLANT
MARKER SKIN DUAL TIP RULER LAB (MISCELLANEOUS) ×2 IMPLANT
MESH PROLENE PML 12X12 (Mesh General) ×2 IMPLANT
NEEDLE HYPO 22GX1.5 SAFETY (NEEDLE) ×2 IMPLANT
NS IRRIG 1000ML POUR BTL (IV SOLUTION) ×2 IMPLANT
PACK GENERAL/GYN (CUSTOM PROCEDURE TRAY) ×2 IMPLANT
PAD ARMBOARD 7.5X6 YLW CONV (MISCELLANEOUS) ×4 IMPLANT
PENCIL SMOKE EVACUATOR (MISCELLANEOUS) ×2 IMPLANT
RETAINER VISCERA MED (MISCELLANEOUS) ×2 IMPLANT
SPONGE LAP 18X18 RF (DISPOSABLE) ×2 IMPLANT
SUCTION POOLE HANDLE (INSTRUMENTS) ×2
SUT ETHILON 3 0 FSL (SUTURE) IMPLANT
SUT MNCRL AB 4-0 PS2 18 (SUTURE) ×2 IMPLANT
SUT NOVA NAB GS-21 0 18 T12 DT (SUTURE) ×4 IMPLANT
SUT PDS AB 0 CT 36 (SUTURE) IMPLANT
SUT PDS AB 1 TP1 54 (SUTURE) IMPLANT
SUT VIC AB 2-0 SH 18 (SUTURE) ×2 IMPLANT
SUT VICRYL AB 2 0 TIES (SUTURE) ×2 IMPLANT
SYR 20CC LL (SYRINGE) ×2 IMPLANT
SYR CONTROL 10ML LL (SYRINGE) ×2 IMPLANT
TOWEL GREEN STERILE (TOWEL DISPOSABLE) ×2 IMPLANT
TOWEL GREEN STERILE FF (TOWEL DISPOSABLE) ×2 IMPLANT
TRAY FOLEY W/BAG SLVR 16FR (SET/KITS/TRAYS/PACK) ×2
TRAY FOLEY W/BAG SLVR 16FR ST (SET/KITS/TRAYS/PACK) ×1 IMPLANT
WATER STERILE IRR 1000ML POUR (IV SOLUTION) ×2 IMPLANT

## 2020-10-13 NOTE — Transfer of Care (Signed)
Immediate Anesthesia Transfer of Care Note  Patient: Wesley [name]  Procedure(s) Performed: OPEN INCISIONAL HERNIA REPAIR WITH MESH (N/A )  Patient Location: PACU  Anesthesia Type:General  Level of Consciousness: drowsy and patient cooperative  Airway & Oxygen Therapy: Patient Spontanous Breathing and Patient connected to face mask oxygen  Post-op Assessment: Report given to RN and Post -op Vital signs reviewed and stable  Post vital signs: Reviewed and stable  Last Vitals:  Vitals Value Taken Time  BP    Temp    Pulse 78 10/13/20 1456  Resp 15 10/13/20 1456  SpO2 98 % 10/13/20 1456  Vitals shown include unvalidated device data.  Last Pain:  Vitals:   10/13/20 1001  TempSrc:   PainSc: 0-No pain         Complications: No complications documented.

## 2020-10-13 NOTE — Op Note (Signed)
10/13/2020  2:21 PM  PATIENT:  Wesley [name]  80 y.o. 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  Patient had left previous ostomy site incisional hernia.  A piece of 25 x 17 piece of Prolene Ethicon mesh was placed in the retrorectus plane and anchored to the pubic tubercle x2 into the fascial layer using 0 Ethibonds.  These were all done in interrupted fashion.  The midline came together without any undue tension.   [name] Dictation After the patient was consented he was taken back to the operating room and placed in the supine position with bilateral SCDs in place. The patient was prepped and draped in usual sterile fashion. Antibiotics were confirmed and timeout was called and all facts verified.  At this time I proceeded to excise the skin scar. This was discarded. I proceeded to use electrocautery to maintain hemostasis and dissection took place in the most superior portion of the wound down to the subcutaneous tissues fat to the anterior fascia. This was incised. The fascia was elevated and 2 Kocher clamps.  The hernia sac and the abdominal cavity were entered bluntly.  There were several Swiss cheese defects at the midline.  The largest midline hernia measured approximately 5 cm.  There is no real adhesions to the anterior abdominal wall or the hernia sacs.   At this time I proceeded to retract is rectus muscles medially.  At  this time the posterior fascia was then incised.  Using blunt dissection the belly was dissected away from the posterior rectus fascia.  This was done inferiorly and superiorly.  At the midline inferior and superior portions of the linea alba this was taken down from the anterior abdominal wall.  This was done bilaterally.  The area to the left of the abdominal wall had a large incisional hernia at the previous ostomy site.  The posterior rectus fascia was incised around this hernia defect.  This was brought together using interrupted figure-of-eight 2-0 Vicryl's.  This brought together the fascia without any undue tension.  The area was checked for hemostasis.  At this time the posterior rectus fascia was reapproximated using #1 PDS in a standard running fashion x2.  I proceeded to irrigate out the retrorectus space.  The area was measured and seemed to be approximately 25 x 17 cm in size.  A piece of Ethicon Prolene mesh was selected and placed into the retrorectus space.  0 Novafil's were then used to anchor the mesh to the Cooper's ligament inferiorly, 2 sutures were placed bilaterally and 3 superiorly to the fascia.  The mesh fit well and flat.  Vistaseal glue was then used to fasten the mesh to the midline fascia.  At this time the anterior fascia and midline were reapproximated using #1 PDS in a standard running fashion x2.  The subcutaneous tissue was irrigated out with sterile saline.  The skin was then reapproximated  using 3-0 Monocryl in subcuticular fashion.  The midline wound was then dressed with Dermabond  The patient tolerated procedure well was taken to the recovery room in stable condition.   PLAN OF CARE: Admit for overnight observation  PATIENT DISPOSITION:  PACU - hemodynamically stable.   Delay start of Pharmacological VTE agent (>24hrs) due to surgical blood loss or risk of bleeding: yes

## 2020-10-13 NOTE — Anesthesia Postprocedure Evaluation (Signed)
Anesthesia Post Note  Patient: Wesley [name]  Procedure(s) Performed: OPEN INCISIONAL HERNIA REPAIR WITH MESH (N/A )     Patient location during evaluation: PACU Anesthesia Type: General Level of consciousness: awake and alert Pain management: pain level controlled Vital Signs Assessment: post-procedure vital signs reviewed and stable Respiratory status: spontaneous breathing, nonlabored ventilation and respiratory function stable Cardiovascular status: blood pressure returned to baseline and stable Postop Assessment: no apparent nausea or vomiting Anesthetic complications: no   No complications documented.  Last Vitals:  Vitals:   10/13/20 1540 10/13/20 1545  BP: 128/89   Pulse: 74 74  Resp: (!) 22 16  Temp:    SpO2: 92% 92%                  Audry Pili

## 2020-10-13 NOTE — H&P (Signed)
  History of Present Illness The patient is a 80 year old male who presents with an incisional hernia. Patient is a 80 year old male, who comes in status post colostomy colostomy reversal. Patient has had a upper midline incisional hernia as well as a incisional hernia at the previous colostomy site. Patient states he noticed a bulge the ostomy site several months after surgery. He states that he does have some discomfort and pain. He does feel that the colostomy site hernia has gotten larger. Patient had a previous midline incisional hernia 10+ years ago with mesh.  Patient did have CT scan which revealed both an upper midline as well as a left colostomy incisional hernia. Did review this personally discussed with the patient. Patient is otherwise active. He's had no signs or symptoms of incarceration or granulation.    Allergies  Demerol *ANALGESICS - OPIOID*  Phenergan *ANTIHISTAMINES*  Promethazine HCl *ANTIHISTAMINES*  Allergies Reconciled   Medication History Ibuprofen (200MG  Capsule, Oral) Active. Finasteride (5MG  Tablet, Oral) Active. Flonase (50MCG/ACT Suspension, Nasal as needed) Active. Lipitor (80MG  Tablet, Oral) Active. Lisinopril (10MG  Tablet, Oral) Active. Vitamin D (50000UNIT Capsule, Oral) Active. Omega 3 (1200MG  Capsule, Oral) Active. Claritin (10MG  Capsule, Oral as needed) Active. Medications Reconciled    Review of Systems All other systems negative  BP (!) 150/98   Pulse 92   Temp 97.9 F (36.6 C) (Oral)   Resp 18   Ht 5\' 9"  (1.753 m)   Wt 90.7 kg   SpO2 100%   BMI 29.53 kg/m      Physical Exam  The physical exam findings are as follows: Note: Constitutional: No acute distress, conversant, appears stated age  Eyes: Anicteric sclerae, moist conjunctiva, no lid lag  Neck: No thyromegaly, trachea midline, no cervical lymphadenopathy  Lungs: Clear to auscultation biilaterally, normal respiratory  effot  Cardiovascular: regular rate & rhythm, no murmurs, no peripheal edema, pedal pulses 2+  GI: Soft, no masses or hepatosplenomegaly, non-tender to palpation  MSK: Normal gait, no clubbing cyanosis, edema  Skin: No rashes, palpation reveals normal skin turgor  Psychiatric: Appropriate judgment and insight, oriented to person, place, and time  Abdomen Inspection Hernias - Incisional - Reducible (Upper midline and colostomy site incisional hernia) .    Assessment & Plan INCISIONAL HERNIA, WITHOUT OBSTRUCTION OR GANGRENE (K43.2) Impression: Patient is a 80 year old male, with a upper midline and colostomy site incisional hernia. 1. The patient will like to proceed to the operating room for open incisional hernia repair with mesh  2. I discussed with the patient the signs and symptoms of incarceration and strangulation and the need to proceed to the ER should they occur.  3. I discussed with the patient the risks and benefits of the procedure to include but not limited to: Infection, bleeding, damage to surrounding structures, possible need for further surgery, possible nerve pain, and possible recurrence. The patient was understanding and wishes to proceed.

## 2020-10-13 NOTE — Progress Notes (Signed)
Pt arrived on unit at 1818.

## 2020-10-13 NOTE — Progress Notes (Signed)
Pt ambulated with walker in room 50 feet. +1 assist for supervision

## 2020-10-13 NOTE — Anesthesia Procedure Notes (Signed)
Procedure Name: Intubation Date/Time: 10/13/2020 12:27 PM Performed by: Hendricks Limes, CRNA Pre-anesthesia Checklist: Patient identified, Emergency Drugs available, Suction available and Patient being monitored Patient Re-evaluated:Patient Re-evaluated prior to induction Oxygen Delivery Method: Circle System Utilized Preoxygenation: Pre-oxygenation with 100% oxygen Induction Type: IV induction Ventilation: Mask ventilation without difficulty Laryngoscope Size: Miller and 2 Grade View: Grade I Tube type: Oral Tube size: 7.5 mm Number of attempts: 1 Airway Equipment and Method: Stylet and Oral airway Placement Confirmation: ETT inserted through vocal cords under direct vision,  positive ETCO2 and breath sounds checked- equal and bilateral Secured at: 22 cm Tube secured with: Tape Dental Injury: Teeth and Oropharynx as per pre-operative assessment

## 2020-10-14 ENCOUNTER — Encounter (HOSPITAL_COMMUNITY): Payer: Self-pay | Admitting: General Surgery

## 2020-10-14 LAB — CBC
HCT: 41.2 % (ref 39.0–52.0)
Hemoglobin: 13.5 g/dL (ref 13.0–17.0)
MCH: 33.8 pg (ref 26.0–34.0)
MCHC: 32.8 g/dL (ref 30.0–36.0)
MCV: 103 fL — ABNORMAL HIGH (ref 80.0–100.0)
Platelets: 179 10*3/uL (ref 150–400)
RBC: 4 MIL/uL — ABNORMAL LOW (ref 4.22–5.81)
RDW: 14.6 % (ref 11.5–15.5)
WBC: 14.2 10*3/uL — ABNORMAL HIGH (ref 4.0–10.5)
nRBC: 0 % (ref 0.0–0.2)

## 2020-10-14 NOTE — Progress Notes (Signed)
1 Day Post-Op   Subjective/Chief Complaint: Pt doing well this AM Tol PO Ambulating with walker Min pain   Objective: Vital signs in last 24 hours: Temp:  [97.2 F (36.2 C)-98.7 F (37.1 C)] 98 F (36.7 C) (03/03 0432) Pulse Rate:  [72-95] 93 (03/03 0432) Resp:  [11-22] 18 (03/03 0432) BP: (97-167)/(70-114) 163/91 (03/03 0432) SpO2:  [90 %-100 %] 96 % (03/03 0432) Weight:  [90.7 kg] 90.7 kg (03/02 0951)    Intake/Output from previous day: 03/02 0701 - 03/03 0700 In: 2008.3 [I.V.:1808.3; IV Piggyback:200] Out: 400 [Urine:300; Blood:100] Intake/Output this shift: No intake/output data recorded.  General appearance: alert and cooperative GI: soft, non-tender; bowel sounds normal; no masses,  no organomegaly and inc c/d/i  Lab Results:  Recent Labs    10/14/20 0451  WBC 14.2*  HGB 13.5  HCT 41.2  PLT 179   Anti-infectives: Anti-infectives (From admission, onward)   Start     Dose/Rate Route Frequency Ordered Stop   10/13/20 0945  ceFAZolin (ANCEF) IVPB 2g/100 mL premix        2 g 200 mL/hr over 30 Minutes Intravenous On call to O.R. 10/13/20 0943 10/13/20 1231      Assessment/Plan: s/p Procedure(s): OPEN INCISIONAL HERNIA REPAIR WITH MESH (N/A) Advance diet as tol Mobilize Possible home today vs tomorrow.  LOS: 1 day    [name] 10/14/2020

## 2020-10-14 NOTE — Plan of Care (Signed)
Patient's pain managed adequately with prn medications. Ambulates to bathroom with walker and stand by assist. Problem: Education: Goal: Knowledge of General Education information will improve Description: Including pain rating scale, medication(s)/side effects and non-pharmacologic comfort measures Outcome: Progressing   Problem: Health Behavior/Discharge Planning: Goal: Ability to manage health-related needs will improve Outcome: Progressing   Problem: Clinical Measurements: Goal: Ability to maintain clinical measurements within normal limits will improve Outcome: Progressing Goal: Will remain free from infection Outcome: Progressing Goal: Diagnostic test results will improve Outcome: Progressing Goal: Respiratory complications will improve Outcome: Progressing Goal: Cardiovascular complication will be avoided Outcome: Progressing   Problem: Activity: Goal: Risk for activity intolerance will decrease Outcome: Progressing   Problem: Nutrition: Goal: Adequate nutrition will be maintained Outcome: Progressing   Problem: Coping: Goal: Level of anxiety will decrease Outcome: Progressing   Problem: Elimination: Goal: Will not experience complications related to bowel motility Outcome: Progressing Goal: Will not experience complications related to urinary retention Outcome: Progressing   Problem: Pain Managment: Goal: General experience of comfort will improve Outcome: Progressing   Problem: Safety: Goal: Ability to remain free from injury will improve Outcome: Progressing   Problem: Skin Integrity: Goal: Risk for impaired skin integrity will decrease Outcome: Progressing

## 2020-10-14 NOTE — Discharge Instructions (Signed)
[facility] _______[facility] ° °HERNIA REPAIR: POST OP INSTRUCTIONS ° °Always review your discharge instruction sheet given to you by the facility where your surgery was performed. °IF YOU HAVE DISABILITY OR FAMILY LEAVE FORMS, YOU MUST BRING THEM TO THE OFFICE FOR PROCESSING.   °DO NOT GIVE THEM TO YOUR DOCTOR. ° °1. A  prescription for pain medication may be given to you upon discharge.  Take your pain medication as prescribed, if needed.  If narcotic pain medicine is not needed, then you may take acetaminophen (Tylenol) or ibuprofen (Advil) as needed. °2. Take your usually prescribed medications unless otherwise directed. °If you need a refill on your pain medication, please contact your pharmacy.  They will contact our office to request authorization. Prescriptions will not be filled after 5 pm or on week-ends. °3. You should follow a light diet the first 24 hours after arrival home, such as soup and crackers, etc.  Be sure to include lots of fluids daily.  Resume your normal diet the day after surgery. °4.Most patients will experience some swelling and bruising around the umbilicus or in the groin and scrotum.  Ice packs and reclining will help.  Swelling and bruising can take several days to resolve.  °6. It is common to experience some constipation if taking pain medication after surgery.  Increasing fluid intake and taking a stool softener (such as Colace) will usually help or prevent this problem from occurring.  A mild laxative (Milk of Magnesia or Miralax) should be taken according to package directions if there are no bowel movements after 48 hours. °7. Unless discharge instructions indicate otherwise, you may remove your bandages 24-48 hours after surgery, and you may shower at that time.  You may have steri-strips (small skin tapes) in place directly over the incision.  These strips should be left on the skin for 7-10 days.  If your surgeon used skin glue on the incision, you may shower in  24 hours.  The glue will flake off over the next 2-3 weeks.  Any sutures or staples will be removed at the office during your follow-up visit. °8. ACTIVITIES:  You may resume regular (light) daily activities beginning the next day--such as daily self-care, walking, climbing stairs--gradually increasing activities as tolerated.  You may have sexual intercourse when it is comfortable.  Refrain from any heavy lifting or straining until approved by your doctor. ° °a.You may drive when you are no longer taking prescription pain medication, you can comfortably wear a seatbelt, and you can safely maneuver your car and apply brakes. °b.RETURN TO WORK:   °_____________________________________________ ° °9.You should see your doctor in the office for a follow-up appointment approximately 2-3 weeks after your surgery.  Make sure that you call for this appointment within a day or two after you arrive home to insure a convenient appointment time. °10.OTHER INSTRUCTIONS: _________________________ °   _____________________________________ ° °WHEN TO CALL YOUR DOCTOR: °1. Fever over 101.0 °2. Inability to urinate °3. Nausea and/or vomiting °4. Extreme swelling or bruising °5. Continued bleeding from incision. °6. Increased pain, redness, or drainage from the incision ° °The clinic staff is available to answer your questions during regular business hours.  Please don’t hesitate to call and ask to speak to one of the nurses for clinical concerns.  If you have a medical emergency, go to the nearest emergency room or call 911.  A surgeon from [facility] is always on call at the hospital ° ° °[address], [address], [place], [place]  [address] ? ° [address] [address], [place], [place]   [address] °[phone] ? [phone] ? FAX [phone] °Web site: [email or web address] ° °

## 2020-10-15 MED ORDER — TRAMADOL HCL 50 MG PO TABS: 50.0000 mg | ORAL_TABLET | Freq: Four times a day (QID) | ORAL | 0 refills | Status: DC | PRN

## 2020-10-15 NOTE — Progress Notes (Signed)
Wesley [name] to be D/C'd per MD order. Discussed with the patient and all questions fully answered. ? VSS, Skin clean, dry and intact without evidence of skin break down, no evidence of skin tears noted. ? IV catheter discontinued intact. Site without signs and symptoms of complications. Dressing and pressure applied. ? An After Visit Summary was printed and given to the patient. Patient informed where to pickup prescriptions. ? D/c education completed with patient/family including follow up instructions, medication list, d/c activities limitations if indicated, with other d/c instructions as indicated by MD - patient able to verbalize understanding, all questions fully answered.  ? Patient instructed to return to ED, call 911, or call MD for any changes in condition.  ? Patient to be escorted via [place], and D/C home via private auto.

## 2020-10-15 NOTE — Discharge Summary (Signed)
Physician Discharge Summary  Patient ID: Wesley [name] MRN: [id number] DOB/AGE: Sep 22, 1940 80 y.o.  Admit date: 10/13/2020 Discharge date: 10/15/2020  Admission [facility]  Discharge Diagnoses:  Active Problems:   S/P hernia repair   Discharged Condition: good  Hospital Course: Pt was admitted post op. He was started on a clear diet and adv to a reg diet and tol that well. He had good paion control and was ambulating well on his own. Pt was AF and deemed stable for DC and DC'd home  Consults: None  Significant Diagnostic Studies: none  Treatments: surgery: as above  Discharge Exam: Blood pressure (!) 155/94, pulse (!) 103, temperature 98.5 F (36.9 C), temperature source Oral, resp. rate 18, height 5\' 9"  (1.753 m), weight 90.7 kg, SpO2 96 %. General appearance: alert and cooperative GI: soft, non-tender; bowel sounds normal; no masses,  no organomegaly and inc c/d/i  Disposition: Discharge disposition: 01-Home or Self Care       Discharge Instructions    Diet - low sodium heart healthy   Complete by: As directed    Increase activity slowly   Complete by: As directed      Allergies as of 10/15/2020      Reactions   Demerol Other (See Comments)   Causes dizziness   Promethazine Hcl Other (See Comments)   Dizziness      Medication List    TAKE these medications   atorvastatin 80 MG tablet Commonly known as: LIPITOR Take 1 tablet (80 mg total) by mouth daily.   cholecalciferol 25 MCG (1000 UNIT) tablet Commonly known as: VITAMIN D3 Take 1,000 Units by mouth daily.   Chromium Picolinate 1000 MCG Tabs Take 1,000 mcg by mouth daily.   fexofenadine 180 MG tablet Commonly known as: ALLEGRA Take 180 mg by mouth daily.   finasteride 5 MG tablet Commonly known as: PROSCAR Take 5 mg by mouth every morning.   Fish Oil 1200 MG Caps Take 1,200 mg by mouth daily.   fluticasone 50 MCG/ACT nasal spray Commonly known as: FLONASE Place 2 sprays into  both nostrils daily as needed for allergies or rhinitis.   ibuprofen 200 MG tablet Commonly known as: ADVIL Take 400 mg by mouth 3 (three) times daily.   lisinopril 10 MG tablet Commonly known as: ZESTRIL Take 10 mg by mouth every morning.   Magnesium 400 MG Tabs Take 400 mg by mouth daily.   Melatonin 5 MG Caps Take 5 mg by mouth at bedtime as needed (bedtime).   traMADol 50 MG tablet Commonly known as: ULTRAM Take 1 tablet (50 mg total) by mouth every 6 (six) hours as needed (mild pain).   Vitamin B-12 5000 MCG Tbdp Take 5,000 mcg by mouth daily.        Signed: [name] 10/15/2020, 6:40 AM

## 2020-10-28 ENCOUNTER — Encounter (HOSPITAL_COMMUNITY): Payer: Self-pay

## 2020-10-28 ENCOUNTER — Other Ambulatory Visit: Payer: Self-pay

## 2020-10-28 ENCOUNTER — Emergency Department (HOSPITAL_COMMUNITY): Payer: Medicare Other

## 2020-10-28 ENCOUNTER — Inpatient Hospital Stay (HOSPITAL_COMMUNITY)
Admission: EM | Admit: 2020-10-28 | Discharge: 2020-11-01 | DRG: 863 | Disposition: A | Discharge status: Home / Self Care | Payer: Medicare Other | Attending: General Surgery | Admitting: General Surgery

## 2020-10-28 DIAGNOSIS — Y838 Other surgical procedures as the cause of abnormal reaction of the patient, or of later complication, without mention of misadventure at the time of the procedure: Secondary | ICD-10-CM | POA: Diagnosis present

## 2020-10-28 DIAGNOSIS — K219 Gastro-esophageal reflux disease without esophagitis: Secondary | ICD-10-CM | POA: Diagnosis present

## 2020-10-28 DIAGNOSIS — K760 Fatty (change of) liver, not elsewhere classified: Secondary | ICD-10-CM | POA: Diagnosis present

## 2020-10-28 DIAGNOSIS — Z79899 Other long term (current) drug therapy: Secondary | ICD-10-CM

## 2020-10-28 DIAGNOSIS — Z808 Family history of malignant neoplasm of other organs or systems: Secondary | ICD-10-CM

## 2020-10-28 DIAGNOSIS — T8140XA Infection following a procedure, unspecified, initial encounter: Principal | ICD-10-CM | POA: Diagnosis present

## 2020-10-28 DIAGNOSIS — L02211 Cutaneous abscess of abdominal wall: Secondary | ICD-10-CM | POA: Diagnosis not present

## 2020-10-28 DIAGNOSIS — E7800 Pure hypercholesterolemia, unspecified: Secondary | ICD-10-CM | POA: Diagnosis present

## 2020-10-28 DIAGNOSIS — K5730 Diverticulosis of large intestine without perforation or abscess without bleeding: Secondary | ICD-10-CM | POA: Diagnosis not present

## 2020-10-28 DIAGNOSIS — N400 Enlarged prostate without lower urinary tract symptoms: Secondary | ICD-10-CM | POA: Diagnosis present

## 2020-10-28 DIAGNOSIS — I2510 Atherosclerotic heart disease of native coronary artery without angina pectoris: Secondary | ICD-10-CM | POA: Diagnosis present

## 2020-10-28 DIAGNOSIS — J9811 Atelectasis: Secondary | ICD-10-CM | POA: Diagnosis not present

## 2020-10-28 DIAGNOSIS — Z888 Allergy status to other drugs, medicaments and biological substances status: Secondary | ICD-10-CM | POA: Diagnosis not present

## 2020-10-28 DIAGNOSIS — Z885 Allergy status to narcotic agent status: Secondary | ICD-10-CM | POA: Diagnosis not present

## 2020-10-28 DIAGNOSIS — Z20822 Contact with and (suspected) exposure to covid-19: Secondary | ICD-10-CM | POA: Diagnosis present

## 2020-10-28 DIAGNOSIS — Z8249 Family history of ischemic heart disease and other diseases of the circulatory system: Secondary | ICD-10-CM

## 2020-10-28 DIAGNOSIS — T8143XA Infection following a procedure, organ and space surgical site, initial encounter: Secondary | ICD-10-CM | POA: Diagnosis not present

## 2020-10-28 DIAGNOSIS — A419 Sepsis, unspecified organism: Secondary | ICD-10-CM | POA: Diagnosis not present

## 2020-10-28 DIAGNOSIS — Z85828 Personal history of other malignant neoplasm of skin: Secondary | ICD-10-CM

## 2020-10-28 DIAGNOSIS — I10 Essential (primary) hypertension: Secondary | ICD-10-CM | POA: Diagnosis not present

## 2020-10-28 DIAGNOSIS — R509 Fever, unspecified: Secondary | ICD-10-CM | POA: Diagnosis not present

## 2020-10-28 DIAGNOSIS — S301XXA Contusion of abdominal wall, initial encounter: Secondary | ICD-10-CM

## 2020-10-28 DIAGNOSIS — G4733 Obstructive sleep apnea (adult) (pediatric): Secondary | ICD-10-CM | POA: Diagnosis present

## 2020-10-28 LAB — CBC WITH DIFFERENTIAL/PLATELET
Abs Immature Granulocytes: 0.11 10*3/uL — ABNORMAL HIGH (ref 0.00–0.07)
Basophils Absolute: 0.1 10*3/uL (ref 0.0–0.1)
Basophils Relative: 1 %
Eosinophils Absolute: 0 10*3/uL (ref 0.0–0.5)
Eosinophils Relative: 0 %
HCT: 35.7 % — ABNORMAL LOW (ref 39.0–52.0)
Hemoglobin: 11.7 g/dL — ABNORMAL LOW (ref 13.0–17.0)
Immature Granulocytes: 1 %
Lymphocytes Relative: 8 %
Lymphs Abs: 0.6 10*3/uL — ABNORMAL LOW (ref 0.7–4.0)
MCH: 33.1 pg (ref 26.0–34.0)
MCHC: 32.8 g/dL (ref 30.0–36.0)
MCV: 101.1 fL — ABNORMAL HIGH (ref 80.0–100.0)
Monocytes Absolute: 1 10*3/uL (ref 0.1–1.0)
Monocytes Relative: 12 %
Neutro Abs: 5.9 10*3/uL (ref 1.7–7.7)
Neutrophils Relative %: 78 %
Platelets: 333 10*3/uL (ref 150–400)
RBC: 3.53 MIL/uL — ABNORMAL LOW (ref 4.22–5.81)
RDW: 14.5 % (ref 11.5–15.5)
WBC: 7.7 10*3/uL (ref 4.0–10.5)
nRBC: 0 % (ref 0.0–0.2)

## 2020-10-28 LAB — COMPREHENSIVE METABOLIC PANEL
ALT: 43 U/L (ref 0–44)
AST: 50 U/L — ABNORMAL HIGH (ref 15–41)
Albumin: 2.6 g/dL — ABNORMAL LOW (ref 3.5–5.0)
Alkaline Phosphatase: 69 U/L (ref 38–126)
Anion gap: 8 (ref 5–15)
BUN: 23 mg/dL (ref 8–23)
CO2: 25 mmol/L (ref 22–32)
Calcium: 8.8 mg/dL — ABNORMAL LOW (ref 8.9–10.3)
Chloride: 99 mmol/L (ref 98–111)
Creatinine, Ser: 0.94 mg/dL (ref 0.61–1.24)
GFR, Estimated: >60 mL/min (ref >60)
Glucose, Bld: 119 mg/dL — ABNORMAL HIGH (ref 70–99)
Potassium: 4.3 mmol/L (ref 3.5–5.1)
Sodium: 132 mmol/L — ABNORMAL LOW (ref 135–145)
Total Bilirubin: 0.7 mg/dL (ref 0.3–1.2)
Total Protein: 8.5 g/dL — ABNORMAL HIGH (ref 6.5–8.1)

## 2020-10-28 LAB — RESP PANEL BY RT-PCR (FLU A&B, COVID) ARPGX2
Influenza A by PCR: NEGATIVE
Influenza B by PCR: NEGATIVE
SARS Coronavirus 2 by RT PCR: NEGATIVE

## 2020-10-28 LAB — PROTIME-INR
INR: 1.2 (ref 0.8–1.2)
Prothrombin Time: 14.8 seconds (ref 11.4–15.2)

## 2020-10-28 LAB — APTT: aPTT: 30 seconds (ref 24–36)

## 2020-10-28 LAB — LACTIC ACID, PLASMA: Lactic Acid, Venous: 1.9 mmol/L (ref 0.5–1.9)

## 2020-10-28 IMAGING — DX DG CHEST 1V PORT
1 series · 1 of 1 positions shown · non-contrast
Comparison: [DATE]

CLINICAL DATA: Fever and weakness. Questionable sepsis - evaluate
for abnormality

EXAM:
PORTABLE CHEST 1 VIEW

[chest ap]
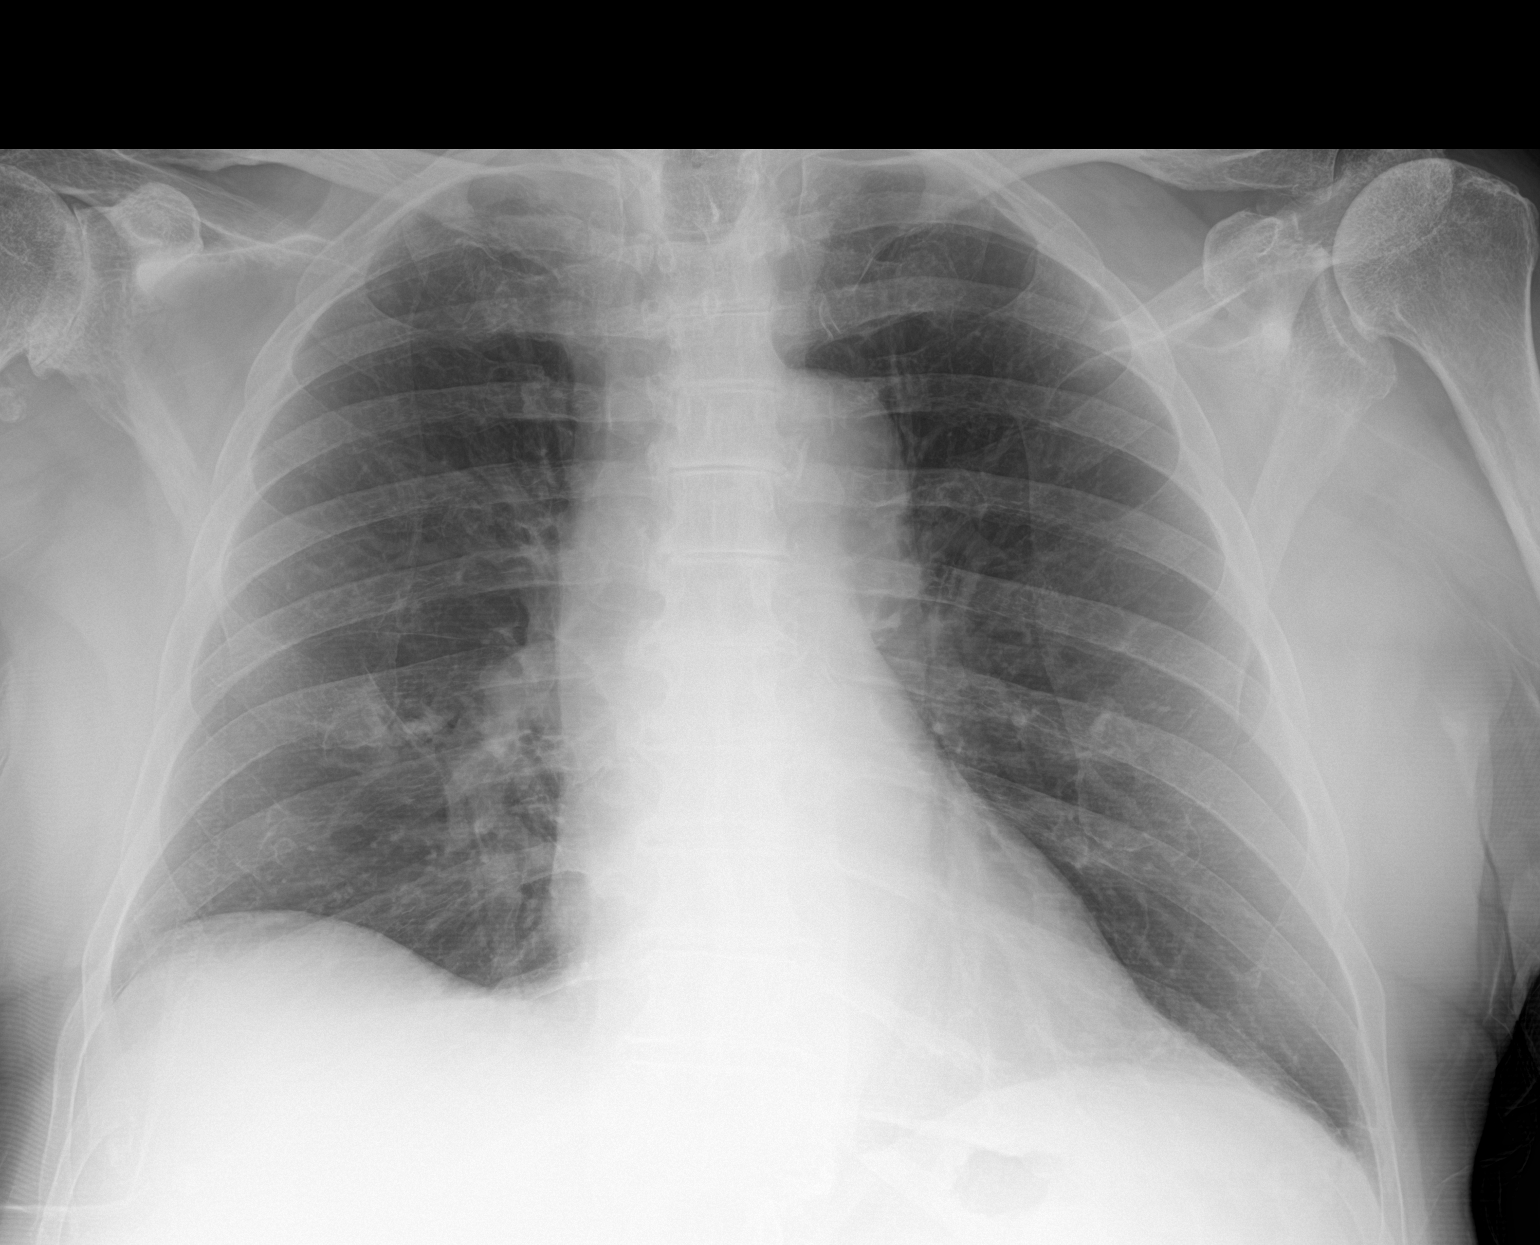

[1 of 1 positions shown; findings below may reference images not displayed]

FINDINGS: The cardiomediastinal contours are normal. Subsegmental atelectasis
at the left lung base and right infrahilar region. Pulmonary
vasculature is normal. Mild right greater than left biapical
pleuroparenchymal scarring. No consolidation, pleural effusion, or
pneumothorax. No acute osseous abnormalities are seen.
IMPRESSION: Subsegmental atelectasis in the left lung base and right infrahilar
region. No focal consolidation.

## 2020-10-28 IMAGING — CT CT ABD-PELV W/ CM
2 of 5 series · 16 of 46 positions shown, 18 images · IV contrast (omnipaque)
Comparison: [DATE]

CLINICAL DATA: History of hernia repair on [DATE] with
subsequent abdominal pain and fever.

EXAM:
CT ABDOMEN AND PELVIS WITH CONTRAST
TECHNIQUE: Multidetector CT imaging of the abdomen and pelvis was performed
using the standard protocol following bolus administration of
intravenous contrast.
CONTRAST:  100mL OMNIPAQUE IOHEXOL 300 MG/ML  SOLN

[Series 2: axial st · axial · 0.98mm/px · z∈[-590,-120]mm · 13 of 110 slices shown, 15 images]
[im 8/110  soft-tissue]
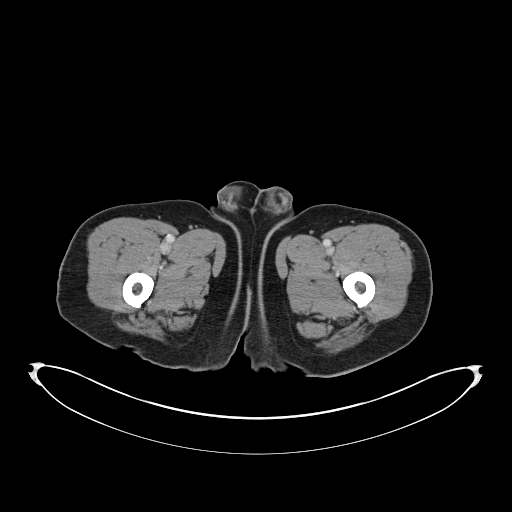
[im 8/110  bone]
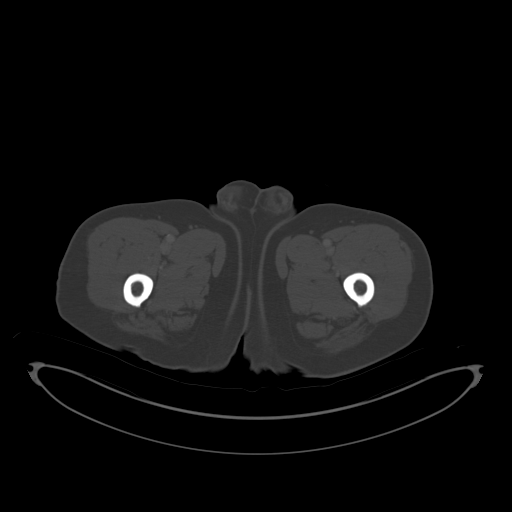
[im 15/110  soft-tissue]
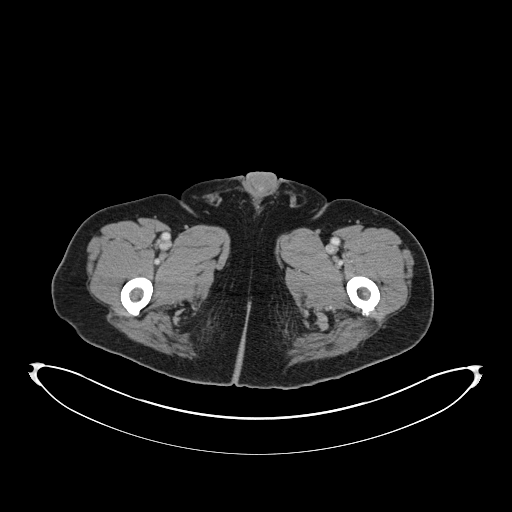
[im 22/110  soft-tissue]
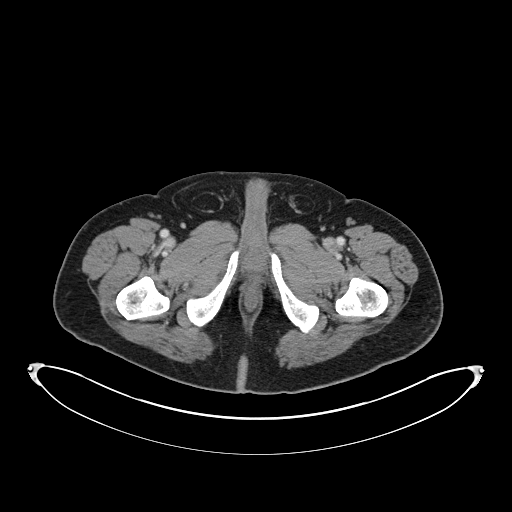
[im 30/110  soft-tissue]
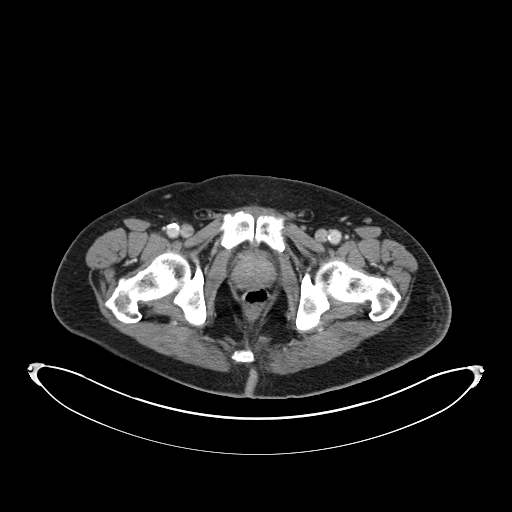
[im 37/110  soft-tissue]
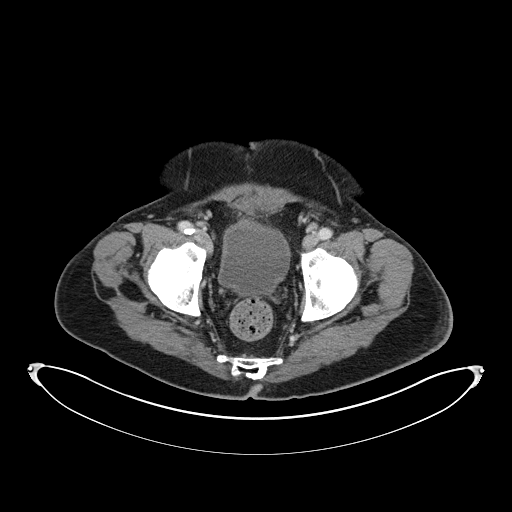
[im 44/110  soft-tissue]
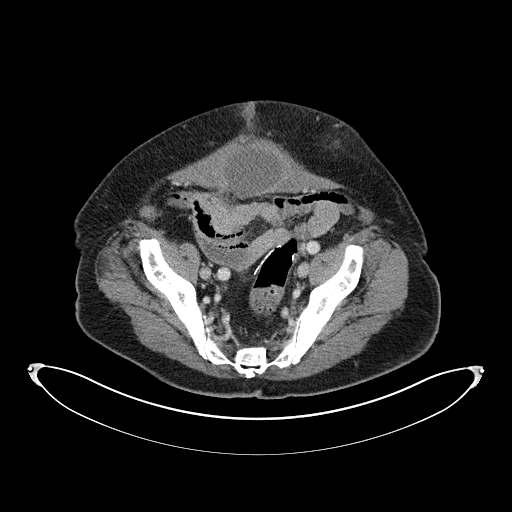
[im 59/110  soft-tissue]
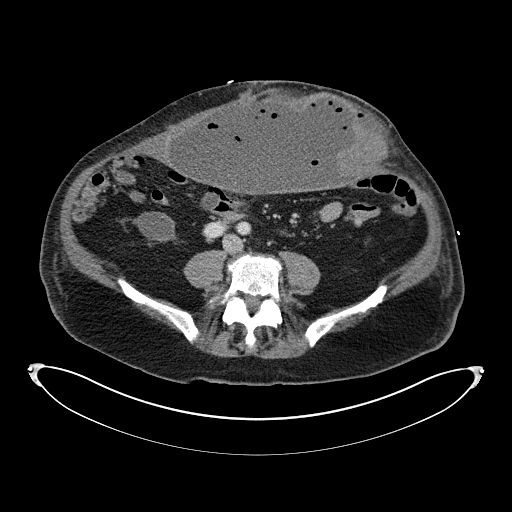
[im 66/110  soft-tissue]
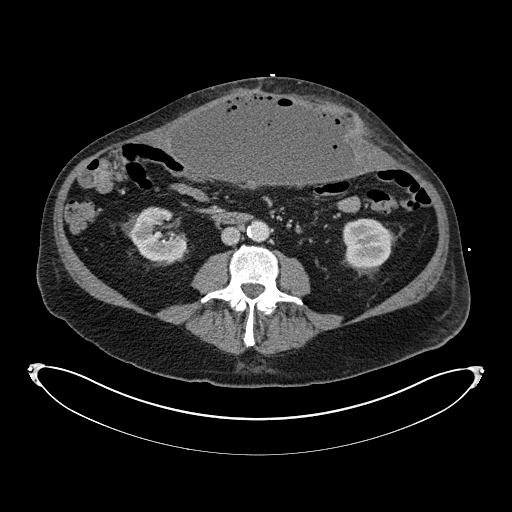
[im 73/110  soft-tissue]
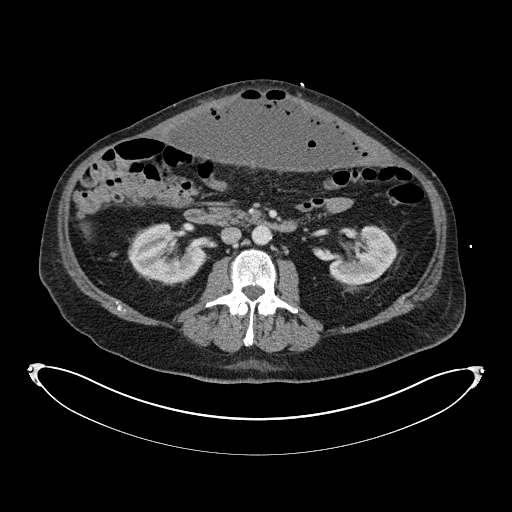
[im 73/110  bone]
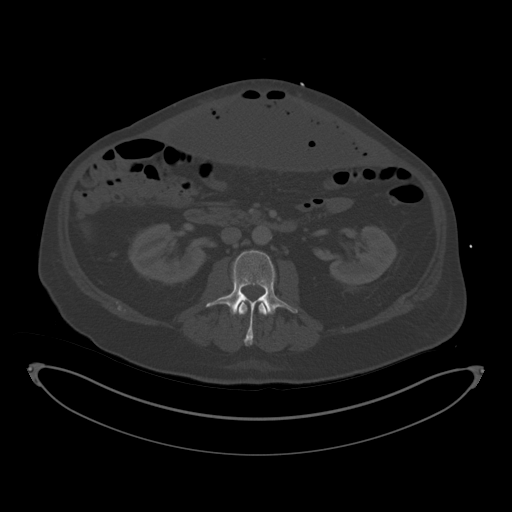
[im 80/110  soft-tissue]
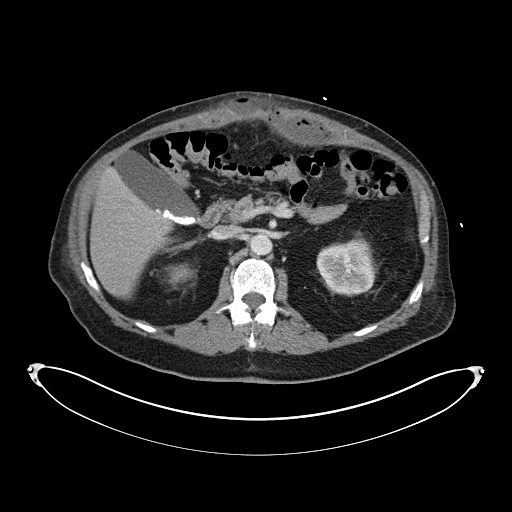
[im 88/110  soft-tissue]
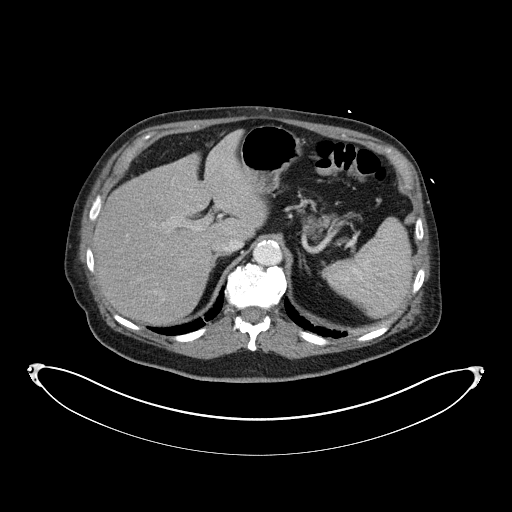
[im 95/110  soft-tissue]
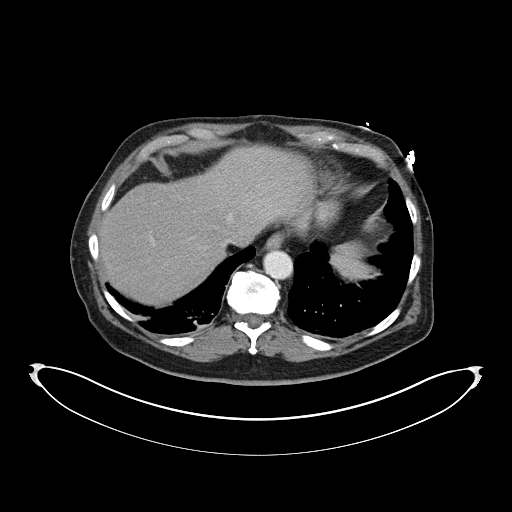
[im 102/110  soft-tissue]
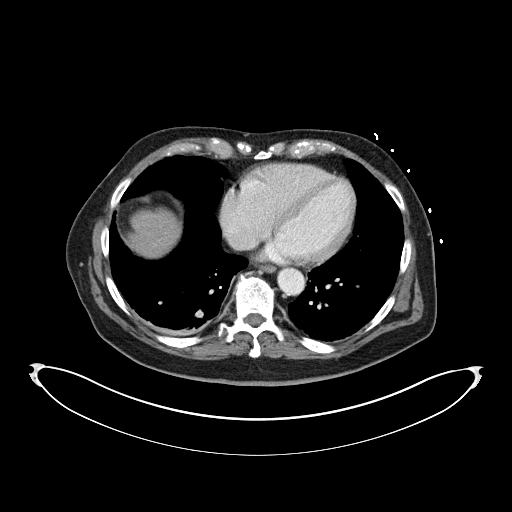

[Series 5: coronal st · coronal · 1.05mm/px · 3 of 187 slices shown]
[im 63/187  soft-tissue]
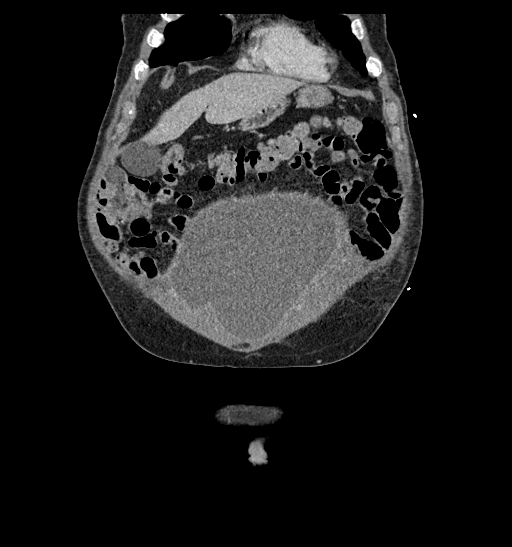
[im 83/187  soft-tissue]
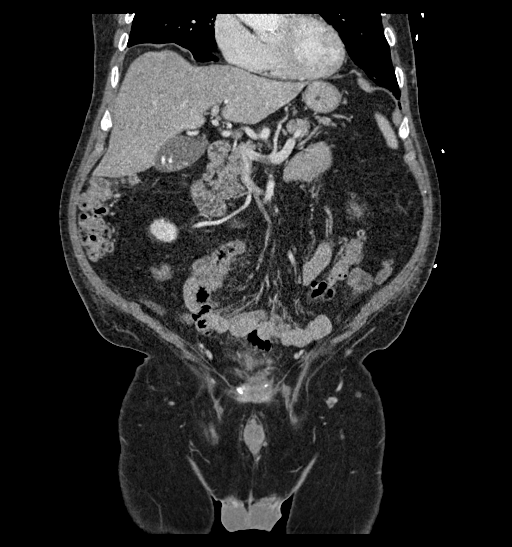
[im 104/187  soft-tissue]
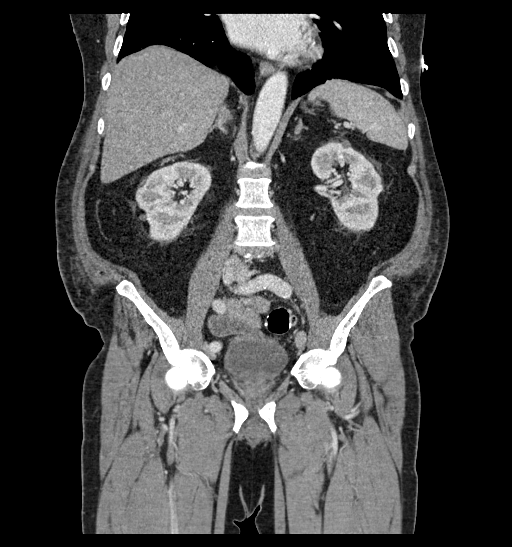

[16 of 46 positions shown; findings below may reference images not displayed]

FINDINGS: Lower chest: Mild linear scarring and/or atelectasis is seen within
the right lung base.

Hepatobiliary: There is diffuse fatty infiltration of the liver
parenchyma. No focal liver abnormality is seen. Multiple
subcentimeter gallstones are seen within the lumen of an otherwise
normal-appearing gallbladder.

Pancreas: Unremarkable. No pancreatic ductal dilatation or
surrounding inflammatory changes.

Spleen: Normal in size without focal abnormality.

Adrenals/Urinary Tract: Adrenal glands are unremarkable. Kidneys are
normal, without renal calculi or hydronephrosis. A 3.7 cm x 2.6 cm
cyst is seen within the lower pole of the right kidney. Bladder is
unremarkable.

Stomach/Bowel: Stomach is within normal limits. Appendix appears
normal. No evidence of bowel wall thickening, distention, or
inflammatory changes. Noninflamed diverticula are seen throughout
the large bowel.

Vascular/Lymphatic: Aortic atherosclerosis. No enlarged abdominal or
pelvic lymph nodes.

Reproductive: Prostate gland is mildly enlarged.

Other: A very large, approximately 18.5 cm x 8.8 cm x 22.8 cm
collection of fluid and air, with thin, surrounding hyperdense rim,
is seen within the anterior abdominal wall, along the midline. This
extends inferiorly along the anterior pelvic wall. Associated
subcutaneous and para muscular inflammatory fat stranding is noted.

Musculoskeletal: Degenerative changes seen within the lower lumbar
spine.
IMPRESSION: 1. Very large anterior abdominal wall abscess, as described above.
2. Cholelithiasis.
3. Colonic diverticulosis.
4. Aortic atherosclerosis.
5. Mildly enlarged prostate gland.

Aortic Atherosclerosis ([BH]-[BH]).

## 2020-10-28 MED ORDER — ONDANSETRON HCL 4 MG/2ML IJ SOLN: 4.0000 mg | Freq: Four times a day (QID) | INTRAMUSCULAR | Status: DC | PRN

## 2020-10-28 MED ORDER — VANCOMYCIN HCL 1750 MG/350ML IV SOLN
1750.0000 mg | Freq: Once | INTRAVENOUS | Status: AC
  Administered 2020-10-28: 1750 mg via INTRAVENOUS
  Filled 2020-10-28: qty 350

## 2020-10-28 MED ORDER — ACETAMINOPHEN 650 MG RE SUPP: 650.0000 mg | Freq: Four times a day (QID) | RECTAL | Status: DC | PRN

## 2020-10-28 MED ORDER — FINASTERIDE 5 MG PO TABS
5.0000 mg | ORAL_TABLET | Freq: Every morning | ORAL | Status: DC
  Administered 2020-10-30 – 2020-11-01 (×3): 5 mg via ORAL
  Filled 2020-10-28 (×3): qty 1

## 2020-10-28 MED ORDER — IOHEXOL 300 MG/ML  SOLN
100.0000 mL | Freq: Once | INTRAMUSCULAR | Status: AC | PRN
  Administered 2020-10-28: 100 mL via INTRAVENOUS

## 2020-10-28 MED ORDER — PIPERACILLIN-TAZOBACTAM 3.375 G IVPB
3.3750 g | Freq: Three times a day (TID) | INTRAVENOUS | Status: DC
  Administered 2020-10-29 – 2020-10-31 (×7): 3.375 g via INTRAVENOUS
  Filled 2020-10-28 (×8): qty 50

## 2020-10-28 MED ORDER — SODIUM CHLORIDE 0.9 % IV SOLN: 2.0000 g | Freq: Once | INTRAVENOUS | Status: DC

## 2020-10-28 MED ORDER — ACETAMINOPHEN 325 MG PO TABS
650.0000 mg | ORAL_TABLET | Freq: Four times a day (QID) | ORAL | Status: DC | PRN
  Administered 2020-10-29 – 2020-10-30 (×3): 650 mg via ORAL
  Filled 2020-10-28 (×3): qty 2

## 2020-10-28 MED ORDER — PIPERACILLIN-TAZOBACTAM 3.375 G IVPB 30 MIN
3.3750 g | Freq: Once | INTRAVENOUS | Status: AC
  Administered 2020-10-28: 3.375 g via INTRAVENOUS
  Filled 2020-10-28: qty 50

## 2020-10-28 MED ORDER — ATORVASTATIN CALCIUM 40 MG PO TABS
80.0000 mg | ORAL_TABLET | Freq: Every day | ORAL | Status: DC
  Administered 2020-10-28 – 2020-11-01 (×4): 80 mg via ORAL
  Filled 2020-10-28 (×4): qty 2

## 2020-10-28 MED ORDER — FLUTICASONE PROPIONATE 50 MCG/ACT NA SUSP
2.0000 | Freq: Every day | NASAL | Status: DC | PRN
  Filled 2020-10-28: qty 16

## 2020-10-28 MED ORDER — ENOXAPARIN SODIUM 40 MG/0.4ML ~~LOC~~ SOLN
40.0000 mg | SUBCUTANEOUS | Status: DC
  Administered 2020-10-28: 40 mg via SUBCUTANEOUS
  Filled 2020-10-28: qty 0.4

## 2020-10-28 MED ORDER — HYDROMORPHONE HCL 1 MG/ML IJ SOLN: 1.0000 mg | INTRAMUSCULAR | Status: DC | PRN

## 2020-10-28 MED ORDER — ONDANSETRON 4 MG PO TBDP: 4.0000 mg | ORAL_TABLET | Freq: Four times a day (QID) | ORAL | Status: DC | PRN

## 2020-10-28 MED ORDER — OXYCODONE HCL 5 MG PO TABS
5.0000 mg | ORAL_TABLET | ORAL | Status: DC | PRN
  Administered 2020-10-30: 10 mg via ORAL
  Administered 2020-10-30 – 2020-10-31 (×3): 5 mg via ORAL
  Administered 2020-10-31 – 2020-11-01 (×4): 10 mg via ORAL
  Filled 2020-10-28 (×2): qty 2
  Filled 2020-10-28 (×2): qty 1
  Filled 2020-10-28: qty 2
  Filled 2020-10-28: qty 1
  Filled 2020-10-28 (×2): qty 2

## 2020-10-28 MED ORDER — LORATADINE 10 MG PO TABS
10.0000 mg | ORAL_TABLET | Freq: Every day | ORAL | Status: DC
  Administered 2020-10-28 – 2020-11-01 (×4): 10 mg via ORAL
  Filled 2020-10-28 (×4): qty 1

## 2020-10-28 MED ORDER — LISINOPRIL 10 MG PO TABS
10.0000 mg | ORAL_TABLET | Freq: Every morning | ORAL | Status: DC
  Administered 2020-10-30 – 2020-11-01 (×3): 10 mg via ORAL
  Filled 2020-10-28 (×3): qty 1

## 2020-10-28 MED ORDER — DEXTROSE-NACL 5-0.9 % IV SOLN: INTRAVENOUS | Status: DC

## 2020-10-28 MED ORDER — TRAMADOL HCL 50 MG PO TABS
50.0000 mg | ORAL_TABLET | Freq: Four times a day (QID) | ORAL | Status: DC | PRN
  Administered 2020-10-29 – 2020-10-31 (×5): 50 mg via ORAL
  Filled 2020-10-28 (×6): qty 1

## 2020-10-28 NOTE — H&P (Signed)
Wesley [name] is an 80 y.o. male.   Chief Complaint: Abscess HPI: 80 year old male 2 weeks status post complex ventral hernia repair with mesh by Dr. Rosendo Gros.  He has done well until the last few days when he developed a fever to 102, malaise and generalized abdominal pain.  CT scan obtained which shows a large abscess in the anterior abdominal wall.  There are small locules of air.  His white count though is normal.  His biggest complaint is fever and malaise.  He has no nausea vomiting.  Denies dysuria.  Past Medical History:  Diagnosis Date  . 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 CAD (coronary artery disease)   . Cholelithiasis   . Chronic allergic rhinitis   . Coronary atherosclerosis of native coronary artery   . Fatty liver   . GERD (gastroesophageal reflux disease)   . High blood pressure   . Hypercholesterolemia   . Macrocytosis without anemia   . OSA on CPAP   . Paraesophageal hernia   . Partial bowel obstruction ([place])   . 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 Sleep apnea   . Umbilical hernia without obstruction or gangrene     Past Surgical History:  Procedure Laterality Date  . BIOPSY  10/29/2019   Procedure: BIOPSY;  Surgeon: Jerene Bears, MD;  Location: [facility];  Service: Gastroenterology;;  . CARDIAC CATHETERIZATION  2000   Cath to rule out cardiac problems RT HTN. PT denies significant findings  . CARDIAC CATHETERIZATION  2008  . COLOSTOMY N/A 10/31/2019   Procedure: COLOSTOMY;  Surgeon: Erroll Luna, MD;  Location: [facility];  Service: General;  Laterality: N/A;  . CYSTOSCOPY W/ URETERAL STENT PLACEMENT Bilateral 10/31/2019   Procedure: CYSTOSCOPY URETERAL STENT PLACEMENT BILATERAL;  Surgeon: Franchot Gallo, MD;  Location: [facility];  Service: Urology;  Laterality: Bilateral;  . CYSTOSCOPY WITH STENT PLACEMENT Bilateral 03/11/2020   Procedure: CYSTOSCOPY WITH BILATERAL FIREFLY INJECTION;  Surgeon: Irine Seal, MD;  Location: [facility];  Service: Urology;  Laterality: Bilateral;  . EYE SURGERY     BILATERAL CATARACT SURGERY WITH LENS IMPLANTS  . FLEXIBLE SIGMOIDOSCOPY N/A 10/29/2019   Procedure: FLEXIBLE SIGMOIDOSCOPY;  Surgeon: Jerene Bears, MD;  Location: [facility];  Service: Gastroenterology;  Laterality: N/A;  . INCISIONAL HERNIA REPAIR N/A 10/13/2020   Procedure: OPEN INCISIONAL HERNIA REPAIR WITH MESH;  Surgeon: [name], MD;  Location: [facility];  Service: General;  Laterality: N/A;  . INSERTION OF MESH N/A 09/08/2016   Procedure: INSERTION OF MESH;  Surgeon: Jackolyn Confer, MD;  Location: [facility];  Service: General;  Laterality: N/A;  . LAPAROSCOPIC SIGMOID COLECTOMY N/A 10/31/2019   Procedure: DIAGNOSTIC LAPAROSCOPY; EXPLORATORY LAPAROTOMY; SIGMOID COLECTOMY;  Surgeon: Erroll Luna, MD;  Location: [facility];  Service: General;  Laterality: N/A;  . right rotator cuff    . ROTATOR CUFF REPAIR    . SUBMUCOSAL TATTOO INJECTION  10/29/2019   Procedure: SUBMUCOSAL TATTOO INJECTION;  Surgeon: Jerene Bears, MD;  Location: [facility];  Service: Gastroenterology;;  . TONSILLECTOMY    . UMBILICAL HERNIA REPAIR N/A 09/08/2016   Procedure: OPEN UMBILICAL HERNIA REPAIR WITH MESH;  Surgeon: Jackolyn Confer, MD;  Location: [facility];  Service: General;  Laterality: N/A;  . XI ROBOTIC ASSISTED COLOSTOMY TAKEDOWN N/A 03/11/2020   Procedure: ROBOTIC ASSISTED COLOSTOMY REVERSAL, RIGID PROCTOSCOPY;  Surgeon: Leighton Ruff, MD;  Location[facility];  Service: General;  Laterality: N/A;  Family History  Problem Relation Age of Onset  . Diabetes Mother   . CAD Father   . Melanoma Brother 60  . Healthy Daughter   . Healthy Son   . Colon cancer Neg Hx   . Esophageal cancer Neg Hx   . Rectal cancer Neg Hx   . Stomach cancer Neg Hx    Social History:  reports that he has never smoked. He has never used smokeless tobacco. He reports current alcohol use of about 14.0 standard drinks of alcohol per week. He  reports that he does not use drugs.  Allergies:  Allergies  Allergen Reactions  . Demerol Other (See Comments)    Causes dizziness  . Promethazine Hcl Other (See Comments)    Dizziness     (Not in a hospital admission)   Results for orders placed or performed during the hospital encounter of 10/28/20 (from the past 48 hour(s))  Resp Panel by RT-PCR (Flu A&B, Covid) Nasopharyngeal Swab     Status: None   Collection Time: 10/28/20  6:34 PM   Specimen: Nasopharyngeal Swab; Nasopharyngeal(NP) swabs in vial transport medium  Result Value Ref Range   SARS Coronavirus 2 by RT PCR NEGATIVE NEGATIVE    Comment: (NOTE) SARS-CoV-2 target nucleic acids are NOT DETECTED.  The SARS-CoV-2 RNA is generally detectable in upper respiratory specimens during the acute phase of infection. The lowest concentration of SARS-CoV-2 viral copies this assay can detect is 138 copies/mL. A negative result does not preclude SARS-Cov-2 infection and should not be used as the sole basis for treatment or other patient management decisions. A negative result may occur with  improper specimen collection/handling, submission of specimen other than nasopharyngeal swab, presence of viral mutation(s) within the areas targeted by this assay, and inadequate number of viral copies(<138 copies/mL). A negative result must be combined with clinical observations, patient history, and epidemiological information. The expected result is Negative.  Fact Sheet for Patients:  EntrepreneurPulse.com.au  Fact Sheet for Healthcare Providers:  IncredibleEmployment.be  This test is no t yet approved or cleared by the [place] FDA and  has been authorized for detection and/or diagnosis of SARS-CoV-2 by FDA under an Emergency Use Authorization (EUA). This EUA will remain  in effect (meaning this test can be used) for the duration of the COVID-19 declaration under Section 564(b)(1) of the  Act, 21 U.S.C.section 360bbb-3(b)(1), unless the authorization is terminated  or revoked sooner.       Influenza A by PCR NEGATIVE NEGATIVE   Influenza B by PCR NEGATIVE NEGATIVE    Comment: (NOTE) The Xpert Xpress SARS-CoV-2/FLU/RSV plus assay is intended as an aid in the diagnosis of influenza from Nasopharyngeal swab specimens and should not be used as a sole basis for treatment. Nasal washings and aspirates are unacceptable for Xpert Xpress SARS-CoV-2/FLU/RSV testing.  Fact Sheet for Patients: EntrepreneurPulse.com.au  Fact Sheet for Healthcare Providers: IncredibleEmployment.be  This test is not yet approved or cleared by the [place] FDA and has been authorized for detection and/or diagnosis of SARS-CoV-2 by FDA under an Emergency Use Authorization (EUA). This EUA will remain in effect (meaning this test can be used) for the duration of the COVID-19 declaration under Section 564(b)(1) of the Act, 21 U.S.C. section 360bbb-3(b)(1), unless the authorization is terminated or revoked.  Performed at [facility], [place] [address], [place], [place] [address]   Lactic acid, plasma     Status: None   Collection Time: 10/28/20  6:35 PM  Result  Value Ref Range   Lactic Acid, Venous 1.9 0.5 - 1.9 mmol/L    Comment: Performed at [facility], [place] [address] [place], [place] [address]  Comprehensive metabolic panel     Status: Abnormal   Collection Time: 10/28/20  6:35 PM  Result Value Ref Range   Sodium 132 (L) 135 - 145 mmol/L   Potassium 4.3 3.5 - 5.1 mmol/L   Chloride 99 98 - 111 mmol/L   CO2 25 22 - 32 mmol/L   Glucose, Bld 119 (H) 70 - 99 mg/dL    Comment: Glucose reference range applies only to samples taken after fasting for at least 8 hours.   BUN 23 8 - 23 mg/dL   Creatinine, Ser 0.94 0.61 - 1.24 mg/dL   Calcium 8.8 (L) 8.9 - 10.3 mg/dL   Total Protein 8.5 (H) 6.5 - 8.1 g/dL   Albumin 2.6  (L) 3.5 - 5.0 g/dL   AST 50 (H) 15 - 41 U/L   ALT 43 0 - 44 U/L   Alkaline Phosphatase 69 38 - 126 U/L   Total Bilirubin 0.7 0.3 - 1.2 mg/dL   GFR, Estimated >60 >60 mL/min    Comment: (NOTE) Calculated using the CKD-EPI Creatinine Equation (2021)    Anion gap 8 5 - 15    Comment: Performed at [facility], [place] [address], [place], [place] [address]  CBC WITH DIFFERENTIAL     Status: Abnormal   Collection Time: 10/28/20  6:35 PM  Result Value Ref Range   WBC 7.7 4.0 - 10.5 K/uL   RBC 3.53 (L) 4.22 - 5.81 MIL/uL   Hemoglobin 11.7 (L) 13.0 - 17.0 g/dL   HCT 35.7 (L) 39.0 - 52.0 %   MCV 101.1 (H) 80.0 - 100.0 fL   MCH 33.1 26.0 - 34.0 pg   MCHC 32.8 30.0 - 36.0 g/dL   RDW 14.5 11.5 - 15.5 %   Platelets 333 150 - 400 K/uL   nRBC 0.0 0.0 - 0.2 %   Neutrophils Relative % 78 %   Neutro Abs 5.9 1.7 - 7.7 K/uL   Lymphocytes Relative 8 %   Lymphs Abs 0.6 (L) 0.7 - 4.0 K/uL   Monocytes Relative 12 %   Monocytes Absolute 1.0 0.1 - 1.0 K/uL   Eosinophils Relative 0 %   Eosinophils Absolute 0.0 0.0 - 0.5 K/uL   Basophils Relative 1 %   Basophils Absolute 0.1 0.0 - 0.1 K/uL   Immature Granulocytes 1 %   Abs Immature Granulocytes 0.11 (H) 0.00 - 0.07 K/uL    Comment: Performed at [facility], [place] [address] [place], [place] [address]  Protime-INR     Status: None   Collection Time: 10/28/20  6:35 PM  Result Value Ref Range   Prothrombin Time 14.8 11.4 - 15.2 seconds   INR 1.2 0.8 - 1.2    Comment: (NOTE) INR goal varies based on device and disease states. Performed at [facility], [place] [address], [place], [place] [address]   APTT     Status: None   Collection Time: 10/28/20  6:35 PM  Result Value Ref Range   aPTT 30 24 - 36 seconds    Comment: Performed at [facility], [place] [address], [place], [place] [address]   CT ABDOMEN PELVIS W CONTRAST  Result Date: 10/28/2020 CLINICAL DATA:  History of hernia  repair on October 13, 2020 with subsequent abdominal pain and fever. EXAM:  CT ABDOMEN AND PELVIS WITH CONTRAST TECHNIQUE: Multidetector CT imaging of the abdomen and pelvis was performed using the standard protocol following bolus administration of intravenous contrast. CONTRAST:  17mL OMNIPAQUE IOHEXOL 300 MG/ML  SOLN COMPARISON:  July 15, 2020 FINDINGS: Lower chest: Mild linear scarring and/or atelectasis is seen within the right lung base. Hepatobiliary: There is diffuse fatty infiltration of the liver parenchyma. No focal liver abnormality is seen. Multiple subcentimeter gallstones are seen within the lumen of an otherwise normal-appearing gallbladder. Pancreas: Unremarkable. No pancreatic ductal dilatation or surrounding inflammatory changes. Spleen: Normal in size without focal abnormality. Adrenals/Urinary Tract: Adrenal glands are unremarkable. Kidneys are normal, without renal calculi or hydronephrosis. A 3.7 cm x 2.6 cm cyst is seen within the lower pole of the right kidney. Bladder is unremarkable. Stomach/Bowel: Stomach is within normal limits. Appendix appears normal. No evidence of bowel wall thickening, distention, or inflammatory changes. Noninflamed diverticula are seen throughout the large bowel. Vascular/Lymphatic: Aortic atherosclerosis. No enlarged abdominal or pelvic lymph nodes. Reproductive: Prostate gland is mildly enlarged. Other: A very large, approximately 18.5 cm x 8.8 cm x 22.8 cm collection of fluid and air, with thin, surrounding hyperdense rim, is seen within the anterior abdominal wall, along the midline. This extends inferiorly along the anterior pelvic wall. Associated subcutaneous and para muscular inflammatory fat stranding is noted. Musculoskeletal: Degenerative changes seen within the lower lumbar spine. IMPRESSION: 1. Very large anterior abdominal wall abscess, as described above. 2. Cholelithiasis. 3. Colonic diverticulosis. 4. Aortic atherosclerosis. 5. Mildly enlarged  prostate gland. Aortic Atherosclerosis (ICD10-I70.0). Electronically Signed   By: Virgina Norfolk M.D.   On: 10/28/2020 19:52   DG Chest Port 1 View  Result Date: 10/28/2020 CLINICAL DATA:  Fever and weakness. Questionable sepsis - evaluate for abnormality EXAM: PORTABLE CHEST 1 VIEW COMPARISON:  08/12/2013 FINDINGS: The cardiomediastinal contours are normal. Subsegmental atelectasis at the left lung base and right infrahilar region. Pulmonary vasculature is normal. Mild right greater than left biapical pleuroparenchymal scarring. No consolidation, pleural effusion, or pneumothorax. No acute osseous abnormalities are seen. IMPRESSION: Subsegmental atelectasis in the left lung base and right infrahilar region. No focal consolidation. Electronically Signed   By: Keith Rake M.D.   On: 10/28/2020 18:36    Review of Systems  Constitutional: Positive for chills and fever.  HENT: Negative for congestion.   Eyes: Negative for discharge and itching.  Respiratory: Negative for chest tightness.   Cardiovascular: Negative for chest pain and leg swelling.  Gastrointestinal: Positive for abdominal pain.  All other systems reviewed and are negative.   Blood pressure 99/82, pulse 81, temperature 99.7 F (37.6 C), resp. rate 19, SpO2 93 %. Physical Exam Constitutional:      Appearance: Normal appearance.  HENT:     Head: Normocephalic.     Nose: Nose normal.     Mouth/Throat:     Mouth: Mucous membranes are moist.  Eyes:     Pupils: Pupils are equal, round, and reactive to light.  Cardiovascular:     Rate and Rhythm: Normal rate and regular rhythm.  Pulmonary:     Effort: Pulmonary effort is normal.     Breath sounds: Normal breath sounds.  Abdominal:     General: Abdomen is flat.       Comments: Midline incision intact.  No erythema.  Large fluid collection noted though under the skin.  No evidence of rebound or guarding.  Neurological:     General: No focal deficit present.  Mental Status: He is alert.      Assessment/Plan 2-week status post open incisional hernia repair with mesh due to complex hernia by Dr. Rosendo Gros.  CT scan shows postoperative abscess.  Admit for IV fluids, IV antibiotics.  Options are percutaneous drainage versus open drainage.  This does not seem amendable to percutaneous drainage and I think it would be reasonable for step.  If this fails, he may require open drainage and mesh removal potentially.  I discussed these findings with the patient and wife at the bedside.  Dr. Johney Maine to follow-up in the morning since Dr. Rosendo Gros is out of town.  Patient is aware of this and agrees with this treatment plan.  Turner Daniels, MD 10/28/2020, 9:15 PM

## 2020-10-28 NOTE — ED Notes (Signed)
Reminded patient to keep arm straight for antibiotics to infuse. Offered patient a site change but patient declined at the time.

## 2020-10-28 NOTE — ED Notes (Signed)
Pt ambulatory to restroom with walker 

## 2020-10-28 NOTE — ED Notes (Signed)
ED TO INPATIENT HANDOFF REPORT  Name/Age/Gender Wesley [name] 80 y.o. male  Code Status    Code Status Orders  (From admission, onward)         Start     Ordered   10/28/20 2120  Full code  Continuous        10/28/20 2122        Code Status History    Date Active Date Inactive Code Status Order ID Comments User Context   10/13/2020 1754 10/15/2020 1544 Full Code [id number]  [name], MD Inpatient   03/11/2020 1336 03/14/2020 1458 Full Code [id number]  Leighton Ruff, MD Inpatient   10/28/2019 1947 11/05/2019 2119 Full Code [id number]  Elwyn Reach, MD ED   08/12/2013 2222 08/13/2013 2044 Full Code [id number]  Orvan Falconer, MD Inpatient   Advance Care Planning Activity      Home/SNF/Other Home   Chief Complaint Abdominal wall abscess [L02.211]  Level of Care/Admitting Diagnosis ED Disposition    ED Disposition Condition [facility] Area: [facility] [[id number]]  Level of Care: Med-Surg [16]  May admit patient to [facility] or [facility] if equivalent level of care is available:: No  Covid Evaluation: Asymptomatic Screening Protocol (No Symptoms)  Diagnosis: Abdominal wall abscess [474259]  Admitting Physician: [place], [place]  Attending Physician: CCS, MD [[id number]]  Estimated length of stay: past midnight tomorrow  Certification:: I certify this patient will need inpatient services for at least 2 midnights       Medical History Past Medical History:  Diagnosis Date  . 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 CAD (coronary artery disease)   . Cholelithiasis   . Chronic allergic rhinitis   . Coronary atherosclerosis of native coronary artery   . Fatty liver   . GERD (gastroesophageal reflux disease)   . High blood pressure   . Hypercholesterolemia   . Macrocytosis without anemia   . OSA on CPAP   . Paraesophageal hernia   . Partial bowel obstruction ([place])   . 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 Sleep apnea   . Umbilical hernia without obstruction or gangrene     Allergies Allergies  Allergen Reactions  . Demerol Other (See Comments)    Causes dizziness  . Promethazine Hcl Other (See Comments)    Dizziness     IV Location/Drains/Wounds Patient Lines/Drains/Airways Status    Active Line/Drains/Airways    Name Placement date Placement time Site Days   Peripheral IV 01/22/20 Right Wrist 01/22/20  1108  Wrist  280   Peripheral IV 03/11/20 Left;Posterior Hand 03/11/20  0606  Hand  231   Peripheral IV 10/28/20 Left Antecubital 10/28/20  1834  Antecubital  less than 1   Incision (Closed) 10/31/19 Abdomen Medial 10/31/19  1442  -- 363   Incision (Closed) 03/11/20 Abdomen Other (Comment) 03/11/20  1032  -- 231   Incision (Closed) 10/13/20 Abdomen 10/13/20  1101  -- 15   Incision - 1 Port Abdomen 1: Right;Upper 10/31/19  1300  -- 363   Incision - 5 Ports Abdomen Left;Upper Medial;Upper Right;Mid Right;Mid;Lateral Right;Lower 03/11/20  0825  -- 231          Labs/Imaging Results for orders placed or performed during the hospital encounter of 10/28/20 (from the past 48 hour(s))  Resp Panel by RT-PCR (Flu A&B, Covid) Nasopharyngeal Swab     Status: None   Collection Time: 10/28/20  6:34 PM   Specimen: Nasopharyngeal Swab; Nasopharyngeal(NP) swabs in vial transport medium  Result Value Ref Range   SARS Coronavirus 2 by RT PCR NEGATIVE NEGATIVE    Comment: (NOTE) SARS-CoV-2 target nucleic acids are NOT DETECTED.  The SARS-CoV-2 RNA is generally detectable in upper respiratory specimens during the acute phase of infection. The lowest concentration of SARS-CoV-2 viral copies this assay can detect is 138 copies/mL. A negative result does not preclude SARS-Cov-2 infection and should not be used as the sole basis for treatment or other patient management decisions. A negative result may occur with  improper specimen collection/handling, submission of  specimen other than nasopharyngeal swab, presence of viral mutation(s) within the areas targeted by this assay, and inadequate number of viral copies(<138 copies/mL). A negative result must be combined with clinical observations, patient history, and epidemiological information. The expected result is Negative.  Fact Sheet for Patients:  EntrepreneurPulse.com.au  Fact Sheet for Healthcare Providers:  IncredibleEmployment.be  This test is no t yet approved or cleared by the [place] FDA and  has been authorized for detection and/or diagnosis of SARS-CoV-2 by FDA under an Emergency Use Authorization (EUA). This EUA will remain  in effect (meaning this test can be used) for the duration of the COVID-19 declaration under Section 564(b)(1) of the Act, 21 U.S.C.section 360bbb-3(b)(1), unless the authorization is terminated  or revoked sooner.       Influenza A by PCR NEGATIVE NEGATIVE   Influenza B by PCR NEGATIVE NEGATIVE    Comment: (NOTE) The Xpert Xpress SARS-CoV-2/FLU/RSV plus assay is intended as an aid in the diagnosis of influenza from Nasopharyngeal swab specimens and should not be used as a sole basis for treatment. Nasal washings and aspirates are unacceptable for Xpert Xpress SARS-CoV-2/FLU/RSV testing.  Fact Sheet for Patients: EntrepreneurPulse.com.au  Fact Sheet for Healthcare Providers: IncredibleEmployment.be  This test is not yet approved or cleared by the [place] FDA and has been authorized for detection and/or diagnosis of SARS-CoV-2 by FDA under an Emergency Use Authorization (EUA). This EUA will remain in effect (meaning this test can be used) for the duration of the COVID-19 declaration under Section 564(b)(1) of the Act, 21 U.S.C. section 360bbb-3(b)(1), unless the authorization is terminated or revoked.  Performed at [facility], [place] [address] [place], [place] [address]   Lactic acid, plasma     Status: None   Collection Time: 10/28/20  6:35 PM  Result Value Ref Range   Lactic Acid, Venous 1.9 0.5 - 1.9 mmol/L    Comment: Performed at [facility], [place] [address] [place], [place] [address]  Comprehensive metabolic panel     Status: Abnormal   Collection Time: 10/28/20  6:35 PM  Result Value Ref Range   Sodium 132 (L) 135 - 145 mmol/L   Potassium 4.3 3.5 - 5.1 mmol/L   Chloride 99 98 - 111 mmol/L   CO2 25 22 - 32 mmol/L   Glucose, Bld 119 (H) 70 - 99 mg/dL    Comment: Glucose reference range applies only to samples taken after fasting for at least 8 hours.   BUN 23 8 - 23 mg/dL   Creatinine, Ser 0.94 0.61 - 1.24 mg/dL   Calcium 8.8 (L) 8.9 - 10.3 mg/dL   Total Protein 8.5 (H) 6.5 - 8.1 g/dL   Albumin 2.6 (L) 3.5 - 5.0 g/dL   AST 50 (H) 15 - 41 U/L   ALT 43 0 - 44 U/L  Alkaline Phosphatase 69 38 - 126 U/L   Total Bilirubin 0.7 0.3 - 1.2 mg/dL   GFR, Estimated >60 >60 mL/min    Comment: (NOTE) Calculated using the CKD-EPI Creatinine Equation (2021)    Anion gap 8 5 - 15    Comment: Performed at [facility], [place] [address] [place], [place] [address]  CBC WITH DIFFERENTIAL     Status: Abnormal   Collection Time: 10/28/20  6:35 PM  Result Value Ref Range   WBC 7.7 4.0 - 10.5 K/uL   RBC 3.53 (L) 4.22 - 5.81 MIL/uL   Hemoglobin 11.7 (L) 13.0 - 17.0 g/dL   HCT 35.7 (L) 39.0 - 52.0 %   MCV 101.1 (H) 80.0 - 100.0 fL   MCH 33.1 26.0 - 34.0 pg   MCHC 32.8 30.0 - 36.0 g/dL   RDW 14.5 11.5 - 15.5 %   Platelets 333 150 - 400 K/uL   nRBC 0.0 0.0 - 0.2 %   Neutrophils Relative % 78 %   Neutro Abs 5.9 1.7 - 7.7 K/uL   Lymphocytes Relative 8 %   Lymphs Abs 0.6 (L) 0.7 - 4.0 K/uL   Monocytes Relative 12 %   Monocytes Absolute 1.0 0.1 - 1.0 K/uL   Eosinophils Relative 0 %   Eosinophils Absolute 0.0 0.0 - 0.5 K/uL   Basophils Relative 1 %   Basophils Absolute 0.1 0.0 - 0.1 K/uL    Immature Granulocytes 1 %   Abs Immature Granulocytes 0.11 (H) 0.00 - 0.07 K/uL    Comment: Performed at [facility], [place] [address] [place], [place] [address]  Protime-INR     Status: None   Collection Time: 10/28/20  6:35 PM  Result Value Ref Range   Prothrombin Time 14.8 11.4 - 15.2 seconds   INR 1.2 0.8 - 1.2    Comment: (NOTE) INR goal varies based on device and disease states. Performed at [facility], [place] [address], [place], [place] [address]   APTT     Status: None   Collection Time: 10/28/20  6:35 PM  Result Value Ref Range   aPTT 30 24 - 36 seconds    Comment: Performed at [facility], [place] [address] [place], [place] [address]   CT ABDOMEN PELVIS W CONTRAST  Result Date: 10/28/2020 CLINICAL DATA:  History of hernia repair on October 13, 2020 with subsequent abdominal pain and fever. EXAM: CT ABDOMEN AND PELVIS WITH CONTRAST TECHNIQUE: Multidetector CT imaging of the abdomen and pelvis was performed using the standard protocol following bolus administration of intravenous contrast. CONTRAST:  143mL OMNIPAQUE IOHEXOL 300 MG/ML  SOLN COMPARISON:  July 15, 2020 FINDINGS: Lower chest: Mild linear scarring and/or atelectasis is seen within the right lung base. Hepatobiliary: There is diffuse fatty infiltration of the liver parenchyma. No focal liver abnormality is seen. Multiple subcentimeter gallstones are seen within the lumen of an otherwise normal-appearing gallbladder. Pancreas: Unremarkable. No pancreatic ductal dilatation or surrounding inflammatory changes. Spleen: Normal in size without focal abnormality. Adrenals/Urinary Tract: Adrenal glands are unremarkable. Kidneys are normal, without renal calculi or hydronephrosis. A 3.7 cm x 2.6 cm cyst is seen within the lower pole of the right kidney. Bladder is unremarkable. Stomach/Bowel: Stomach is within normal limits. Appendix appears normal. No evidence of bowel wall  thickening, distention, or inflammatory changes. Noninflamed diverticula are seen throughout the large bowel. Vascular/Lymphatic: Aortic atherosclerosis. No enlarged abdominal or pelvic lymph nodes. Reproductive: Prostate gland is mildly enlarged.  Other: A very large, approximately 18.5 cm x 8.8 cm x 22.8 cm collection of fluid and air, with thin, surrounding hyperdense rim, is seen within the anterior abdominal wall, along the midline. This extends inferiorly along the anterior pelvic wall. Associated subcutaneous and para muscular inflammatory fat stranding is noted. Musculoskeletal: Degenerative changes seen within the lower lumbar spine. IMPRESSION: 1. Very large anterior abdominal wall abscess, as described above. 2. Cholelithiasis. 3. Colonic diverticulosis. 4. Aortic atherosclerosis. 5. Mildly enlarged prostate gland. Aortic Atherosclerosis (ICD10-I70.0). Electronically Signed   By: Virgina Norfolk M.D.   On: 10/28/2020 19:52   DG Chest Port 1 View  Result Date: 10/28/2020 CLINICAL DATA:  Fever and weakness. Questionable sepsis - evaluate for abnormality EXAM: PORTABLE CHEST 1 VIEW COMPARISON:  08/12/2013 FINDINGS: The cardiomediastinal contours are normal. Subsegmental atelectasis at the left lung base and right infrahilar region. Pulmonary vasculature is normal. Mild right greater than left biapical pleuroparenchymal scarring. No consolidation, pleural effusion, or pneumothorax. No acute osseous abnormalities are seen. IMPRESSION: Subsegmental atelectasis in the left lung base and right infrahilar region. No focal consolidation. Electronically Signed   By: Keith Rake M.D.   On: 10/28/2020 18:36    Pending Labs Unresulted Labs (From admission, onward)          Start     Ordered   10/29/20 0500  Comprehensive metabolic panel  Tomorrow morning,   R        10/28/20 2122   10/29/20 0500  CBC  Tomorrow morning,   R        10/28/20 2122   10/28/20 1816  Urinalysis, Routine w reflex  microscopic  (Undifferentiated presentation (screening labs and basic nursing orders))  ONCE - STAT,   STAT        10/28/20 1816   10/28/20 1816  Urine culture  (Undifferentiated presentation (screening labs and basic nursing orders))  ONCE - STAT,   STAT        10/28/20 1816   10/28/20 1816  Blood Culture (routine x 2)  (Undifferentiated presentation (screening labs and basic nursing orders))  BLOOD CULTURE X 2,   STAT      10/28/20 1816          Vitals/Pain Today's Vitals   10/28/20 2100 10/28/20 2130 10/28/20 2200 10/28/20 2300  BP: 99/82 114/84 98/65 103/71  Pulse: 81 77 80 84  Resp: 19 18 14 12   Temp:    (!) 97.4 F (36.3 C)  TempSrc:    Oral  SpO2: 93% 94% 92% 94%  PainSc:    0-No pain    Isolation Precautions No active isolations  Medications Medications  traMADol (ULTRAM) tablet 50 mg (has no administration in time range)  atorvastatin (LIPITOR) tablet 80 mg (80 mg Oral Given 10/28/20 2255)  lisinopril (ZESTRIL) tablet 10 mg (has no administration in time range)  finasteride (PROSCAR) tablet 5 mg (has no administration in time range)  loratadine (CLARITIN) tablet 10 mg (10 mg Oral Given 10/28/20 2255)  fluticasone (FLONASE) 50 MCG/ACT nasal spray 2 spray (has no administration in time range)  enoxaparin (LOVENOX) injection 40 mg (40 mg Subcutaneous Given 10/28/20 2255)  dextrose 5 %-0.9 % sodium chloride infusion (has no administration in time range)  oxyCODONE (Oxy IR/ROXICODONE) immediate release tablet 5-10 mg (has no administration in time range)  HYDROmorphone (DILAUDID) injection 1 mg (has no administration in time range)  ondansetron (ZOFRAN-ODT) disintegrating tablet 4 mg (has no administration in time range)    Or  ondansetron (  ZOFRAN) injection 4 mg (has no administration in time range)  acetaminophen (TYLENOL) tablet 650 mg (has no administration in time range)    Or  acetaminophen (TYLENOL) suppository 650 mg (has no administration in time range)   piperacillin-tazobactam (ZOSYN) IVPB 3.375 g (has no administration in time range)  iohexol (OMNIPAQUE) 300 MG/ML solution 100 mL (100 mLs Intravenous Contrast Given 10/28/20 1930)  vancomycin (VANCOREADY) IVPB 1750 mg/350 mL (1,750 mg Intravenous New Bag/Given 10/28/20 2030)  piperacillin-tazobactam (ZOSYN) IVPB 3.375 g (0 g Intravenous Stopped 10/28/20 2100)    Mobility Uses walked at home, able to use walker here unassisted

## 2020-10-28 NOTE — ED Notes (Signed)
Patient transported to CT 

## 2020-10-28 NOTE — ED Triage Notes (Signed)
EMS reports from home, had a hernia repair on 10/13/20, (third time in last year). Pt states he has been getting weaker since, had fever of 102 today at home. Has appt with surgeon tomorrow but was advised to come to ED for Eval prior.  BP 126/76 HR 113 RR 16 Sp02 95 RA CBG 119 Temp 102.8  1gm Tylenol enroute

## 2020-10-28 NOTE — ED Provider Notes (Signed)
[facility] DEPT Provider Note   CSN: [id number] Arrival date & time: 10/28/20  1758     History Chief Complaint  Patient presents with  . Weakness  . Fever    Wesley [name] is a 80 y.o. male.  80 yo M with a chief complaints of a fever.  The patient just had an open hernia repair done about 2 weeks ago.  Since then he had been doing well but developed a fever couple days ago.  No other specific symptoms.  Denies cough or congestion denies abdominal pain denies nausea vomiting or diarrhea.  Denies any rashes or skin breakdown.  The history is provided by the patient.  Weakness Severity:  Moderate Onset quality:  Gradual Duration:  2 days Timing:  Constant Progression:  Worsening Chronicity:  New Relieved by:  Nothing Worsened by:  Nothing Ineffective treatments:  None tried Associated symptoms: fever   Associated symptoms: no abdominal pain, no arthralgias, no chest pain, no diarrhea, no headaches, no myalgias, no shortness of breath and no vomiting   Fever Associated symptoms: no chest pain, no chills, no confusion, no congestion, no diarrhea, no headaches, no myalgias, no rash and no vomiting        Past Medical History:  Diagnosis Date  . 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 CAD (coronary artery disease)   . Cholelithiasis   . Chronic allergic rhinitis   . Coronary atherosclerosis of native coronary artery   . Fatty liver   . GERD (gastroesophageal reflux disease)   . High blood pressure   . Hypercholesterolemia   . Macrocytosis without anemia   . OSA on CPAP   . Paraesophageal hernia   . Partial bowel obstruction ([place])   . 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 Sleep apnea   . Umbilical hernia without obstruction or gangrene     Patient Active Problem List   Diagnosis Date Noted  . S/P hernia repair 10/13/2020  . 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 Partial  obstruction of colon ([place])   . Generalized abdominal pain   . Abdominal bloating   . 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 Colonic mass 10/28/2019  . Hypokalemia 10/28/2019  . 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 Non-traumatic rotator cuff tear 09/21/2017  . Syncope and collapse 08/12/2013  . Back pain 08/12/2013  . CAD (coronary artery disease) 08/12/2013  . High blood pressure 03/15/2011  . GERD (gastroesophageal reflux disease) 03/15/2011  . Paronychia of great toe of left foot 03/15/2011    Past Surgical History:  Procedure Laterality Date  . BIOPSY  10/29/2019   Procedure: BIOPSY;  Surgeon: Jerene Bears, MD;  Location: [facility];  Service: Gastroenterology;;  . CARDIAC CATHETERIZATION  2000   Cath to rule out cardiac problems RT HTN. PT denies significant findings  . CARDIAC CATHETERIZATION  2008  . COLOSTOMY N/A 10/31/2019   Procedure: COLOSTOMY;  Surgeon: Erroll Luna, MD;  Location: [facility];  Service: General;  Laterality: N/A;  . CYSTOSCOPY W/ URETERAL STENT PLACEMENT Bilateral 10/31/2019   Procedure: CYSTOSCOPY URETERAL STENT PLACEMENT BILATERAL;  Surgeon: Franchot Gallo, MD;  Location: [facility];  Service: Urology;  Laterality: Bilateral;  . CYSTOSCOPY WITH STENT PLACEMENT Bilateral 03/11/2020   Procedure: CYSTOSCOPY WITH BILATERAL FIREFLY INJECTION;  Surgeon: Irine Seal, MD;  Location: [facility];  Service: Urology;  Laterality: Bilateral;  . EYE SURGERY  BILATERAL CATARACT SURGERY WITH LENS IMPLANTS  . FLEXIBLE SIGMOIDOSCOPY N/A 10/29/2019   Procedure: FLEXIBLE SIGMOIDOSCOPY;  Surgeon: Jerene Bears, MD;  Location: [facility];  Service: Gastroenterology;  Laterality: N/A;  . INCISIONAL HERNIA REPAIR N/A 10/13/2020   Procedure: OPEN INCISIONAL HERNIA REPAIR WITH MESH;  Surgeon: [name], MD;  Location: [facility];  Service: General;  Laterality: N/A;  . INSERTION OF MESH N/A 09/08/2016   Procedure: INSERTION OF MESH;  Surgeon: Jackolyn Confer, MD;  Location: [facility];  Service: General;  Laterality: N/A;  . LAPAROSCOPIC SIGMOID COLECTOMY N/A 10/31/2019   Procedure: DIAGNOSTIC LAPAROSCOPY; EXPLORATORY LAPAROTOMY; SIGMOID COLECTOMY;  Surgeon: Erroll Luna, MD;  Location: [facility];  Service: General;  Laterality: N/A;  . right rotator cuff    . ROTATOR CUFF REPAIR    . SUBMUCOSAL TATTOO INJECTION  10/29/2019   Procedure: SUBMUCOSAL TATTOO INJECTION;  Surgeon: Jerene Bears, MD;  Location: [facility];  Service: Gastroenterology;;  . TONSILLECTOMY    . UMBILICAL HERNIA REPAIR N/A 09/08/2016   Procedure: OPEN UMBILICAL HERNIA REPAIR WITH MESH;  Surgeon: Jackolyn Confer, MD;  Location: [facility];  Service: General;  Laterality: N/A;  . XI ROBOTIC ASSISTED COLOSTOMY TAKEDOWN N/A 03/11/2020   Procedure: ROBOTIC ASSISTED COLOSTOMY REVERSAL, RIGID PROCTOSCOPY;  Surgeon: Leighton Ruff, MD;  Location: [facility];  Service: General;  Laterality: N/A;       Family History  Problem Relation Age of Onset  . Diabetes Mother   . CAD Father   . Melanoma Brother 66  . Healthy Daughter   . Healthy Son   . Colon cancer Neg Hx   . Esophageal cancer Neg Hx   . Rectal cancer Neg Hx   . Stomach cancer Neg Hx     Social History   Tobacco Use  . Smoking status: Never Smoker  . Smokeless tobacco: Never Used  Vaping Use  . Vaping Use: Never used  Substance Use Topics  . Alcohol use: Yes    Alcohol/week: 14.0 standard drinks    Types: 14 drink(s) per week    Comment: or more  . Drug use: No    Home Medications Prior to Admission medications   Medication Sig Start Date End Date Taking? Authorizing Provider  atorvastatin (LIPITOR) 80 MG tablet Take 1 tablet (80 mg total) by mouth daily. 08/16/20   Jerline Pain, MD  cholecalciferol (VITAMIN D3) 25 MCG (1000 UNIT) tablet Take 1,000 Units by mouth daily.    [provider]  Chromium Picolinate 1000 MCG TABS Take 1,000 mcg by mouth daily.     [provider]   Cyanocobalamin (VITAMIN B-12) 5000 MCG TBDP Take 5,000 mcg by mouth daily.     [provider]  fexofenadine (ALLEGRA) 180 MG tablet Take 180 mg by mouth daily.    [provider]  finasteride (PROSCAR) 5 MG tablet Take 5 mg by mouth every morning.     [provider]  fluticasone (FLONASE) 50 MCG/ACT nasal spray Place 2 sprays into both nostrils daily as needed for allergies or rhinitis.    [provider]  ibuprofen (ADVIL) 200 MG tablet Take 400 mg by mouth 3 (three) times daily.    [provider]  lisinopril (PRINIVIL,ZESTRIL) 10 MG tablet Take 10 mg by mouth every morning.     [provider]  Magnesium 400 MG TABS Take 400 mg by mouth daily.     [provider]  Melatonin 5 MG CAPS Take 5 mg by  mouth at bedtime as needed (bedtime).    [provider]  Omega-3 Fatty Acids (FISH OIL) 1200 MG CAPS Take 1,200 mg by mouth daily.     [provider]  traMADol (ULTRAM) 50 MG tablet Take 1 tablet (50 mg total) by mouth every 6 (six) hours as needed (mild pain). 10/15/20   [name], MD    Allergies    Demerol and Promethazine hcl  Review of Systems   Review of Systems  Constitutional: Positive for fever. Negative for chills.  HENT: Negative for congestion and facial swelling.   Eyes: Negative for discharge and visual disturbance.  Respiratory: Negative for shortness of breath.   Cardiovascular: Negative for chest pain and palpitations.  Gastrointestinal: Negative for abdominal pain, diarrhea and vomiting.  Musculoskeletal: Negative for arthralgias and myalgias.  Skin: Negative for color change and rash.  Neurological: Positive for weakness. Negative for tremors, syncope and headaches.  Psychiatric/Behavioral: Negative for confusion and dysphoric mood.    Physical Exam Updated Vital Signs BP 102/66   Pulse 81   Temp 99.7 F (37.6 C)   Resp 16   SpO2 96%   Physical Exam Vitals and nursing note  reviewed.  Constitutional:      Appearance: He is well-developed.  HENT:     Head: Normocephalic and atraumatic.  Eyes:     Pupils: Pupils are equal, round, and reactive to light.  Neck:     Vascular: No JVD.  Cardiovascular:     Rate and Rhythm: Normal rate and regular rhythm.     Heart sounds: No murmur heard. No friction rub. No gallop.   Pulmonary:     Effort: No respiratory distress.     Breath sounds: No wheezing.  Abdominal:     General: There is no distension.     Tenderness: There is no abdominal tenderness. There is no guarding or rebound.     Comments: Wounds are clean dry and intact.  There is no erythema no drainage no warmth.  Musculoskeletal:        General: Normal range of motion.     Cervical back: Normal range of motion and neck supple.  Skin:    Coloration: Skin is not pale.     Findings: No rash.  Neurological:     Mental Status: He is alert and oriented to person, place, and time.  Psychiatric:        Behavior: Behavior normal.     ED Results / Procedures / Treatments   Labs (all labs ordered are listed, but only abnormal results are displayed) Labs Reviewed  COMPREHENSIVE METABOLIC PANEL - Abnormal; Notable for the following components:      Result Value   Sodium 132 (*)    Glucose, Bld 119 (*)    Calcium 8.8 (*)    Total Protein 8.5 (*)    Albumin 2.6 (*)    AST 50 (*)    All other components within normal limits  CBC WITH DIFFERENTIAL/PLATELET - Abnormal; Notable for the following components:   RBC 3.53 (*)    Hemoglobin 11.7 (*)    HCT 35.7 (*)    MCV 101.1 (*)    Lymphs Abs 0.6 (*)    Abs Immature Granulocytes 0.11 (*)    All other components within normal limits  RESP PANEL BY RT-PCR (FLU A&B, COVID) ARPGX2  URINE CULTURE  CULTURE, BLOOD (ROUTINE X 2)  CULTURE, BLOOD (ROUTINE X 2)  LACTIC ACID, PLASMA  PROTIME-INR  APTT  LACTIC  ACID, PLASMA  URINALYSIS, ROUTINE W REFLEX MICROSCOPIC  I-STAT CHEM 8, ED     EKG None  Radiology CT ABDOMEN PELVIS W CONTRAST  Result Date: 10/28/2020 CLINICAL DATA:  History of hernia repair on October 13, 2020 with subsequent abdominal pain and fever. EXAM: CT ABDOMEN AND PELVIS WITH CONTRAST TECHNIQUE: Multidetector CT imaging of the abdomen and pelvis was performed using the standard protocol following bolus administration of intravenous contrast. CONTRAST:  11mL OMNIPAQUE IOHEXOL 300 MG/ML  SOLN COMPARISON:  July 15, 2020 FINDINGS: Lower chest: Mild linear scarring and/or atelectasis is seen within the right lung base. Hepatobiliary: There is diffuse fatty infiltration of the liver parenchyma. No focal liver abnormality is seen. Multiple subcentimeter gallstones are seen within the lumen of an otherwise normal-appearing gallbladder. Pancreas: Unremarkable. No pancreatic ductal dilatation or surrounding inflammatory changes. Spleen: Normal in size without focal abnormality. Adrenals/Urinary Tract: Adrenal glands are unremarkable. Kidneys are normal, without renal calculi or hydronephrosis. A 3.7 cm x 2.6 cm cyst is seen within the lower pole of the right kidney. Bladder is unremarkable. Stomach/Bowel: Stomach is within normal limits. Appendix appears normal. No evidence of bowel wall thickening, distention, or inflammatory changes. Noninflamed diverticula are seen throughout the large bowel. Vascular/Lymphatic: Aortic atherosclerosis. No enlarged abdominal or pelvic lymph nodes. Reproductive: Prostate gland is mildly enlarged. Other: A very large, approximately 18.5 cm x 8.8 cm x 22.8 cm collection of fluid and air, with thin, surrounding hyperdense rim, is seen within the anterior abdominal wall, along the midline. This extends inferiorly along the anterior pelvic wall. Associated subcutaneous and para muscular inflammatory fat stranding is noted. Musculoskeletal: Degenerative changes seen within the lower lumbar spine. IMPRESSION: 1. Very large anterior abdominal wall  abscess, as described above. 2. Cholelithiasis. 3. Colonic diverticulosis. 4. Aortic atherosclerosis. 5. Mildly enlarged prostate gland. Aortic Atherosclerosis (ICD10-I70.0). Electronically Signed   By: Virgina Norfolk M.D.   On: 10/28/2020 19:52   DG Chest Port 1 View  Result Date: 10/28/2020 CLINICAL DATA:  Fever and weakness. Questionable sepsis - evaluate for abnormality EXAM: PORTABLE CHEST 1 VIEW COMPARISON:  08/12/2013 FINDINGS: The cardiomediastinal contours are normal. Subsegmental atelectasis at the left lung base and right infrahilar region. Pulmonary vasculature is normal. Mild right greater than left biapical pleuroparenchymal scarring. No consolidation, pleural effusion, or pneumothorax. No acute osseous abnormalities are seen. IMPRESSION: Subsegmental atelectasis in the left lung base and right infrahilar region. No focal consolidation. Electronically Signed   By: Keith Rake M.D.   On: 10/28/2020 18:36    Procedures Procedures   Medications Ordered in ED Medications  vancomycin (VANCOREADY) IVPB 1750 mg/350 mL (1,750 mg Intravenous New Bag/Given 10/28/20 2030)  piperacillin-tazobactam (ZOSYN) IVPB 3.375 g (3.375 g Intravenous New Bag/Given 10/28/20 2030)  iohexol (OMNIPAQUE) 300 MG/ML solution 100 mL (100 mLs Intravenous Contrast Given 10/28/20 1930)    ED Course  I have reviewed the triage vital signs and the nursing notes.  Pertinent labs & imaging results that were available during my care of the patient were reviewed by me and considered in my medical decision making (see chart for details).    MDM Rules/Calculators/A&P                          80 yo M with a chief complaints of a fever.  This been going on for the past couple days.  As high as 102 at home.  He had a hernia repair couple weeks ago.  Has been  doing well.  Denies any infectious symptoms.  Will obtain a laboratory evaluation chest x-ray urine CT scan of the abdpelvis.  CT scan with concern for large  abscess along the incision line.  I discussed this with Dr. Brantley Stage.  Will admit.  The patients results and plan were reviewed and discussed.   Any x-rays performed were independently reviewed by myself.   Differential diagnosis were considered with the presenting HPI.  Medications  vancomycin (VANCOREADY) IVPB 1750 mg/350 mL (1,750 mg Intravenous New Bag/Given 10/28/20 2030)  piperacillin-tazobactam (ZOSYN) IVPB 3.375 g (3.375 g Intravenous New Bag/Given 10/28/20 2030)  iohexol (OMNIPAQUE) 300 MG/ML solution 100 mL (100 mLs Intravenous Contrast Given 10/28/20 1930)    Vitals:   10/28/20 1811 10/28/20 1900 10/28/20 2000  BP: 115/75 110/76 102/66  Pulse: 99 91 81  Resp: 16 19 16   Temp: 99.7 F (37.6 C)    SpO2: 95% 94% 96%    Final diagnoses:  Abdominal wall abscess    Admission/ observation were discussed with the admitting physician, patient and/or family and they are comfortable with the plan.   Final Clinical Impression(s) / ED Diagnoses Final diagnoses:  Abdominal wall abscess    Rx / DC Orders ED Discharge Orders    None       Deno Etienne, DO 10/28/20 2048

## 2020-10-28 NOTE — ED Notes (Signed)
Pt given urinal and made aware of need for a urine specimen

## 2020-10-29 ENCOUNTER — Inpatient Hospital Stay (HOSPITAL_COMMUNITY): Payer: Medicare Other

## 2020-10-29 LAB — CBC
HCT: 40 % (ref 39.0–52.0)
Hemoglobin: 12.9 g/dL — ABNORMAL LOW (ref 13.0–17.0)
MCH: 33.2 pg (ref 26.0–34.0)
MCHC: 32.3 g/dL (ref 30.0–36.0)
MCV: 102.8 fL — ABNORMAL HIGH (ref 80.0–100.0)
Platelets: 373 10*3/uL (ref 150–400)
RBC: 3.89 MIL/uL — ABNORMAL LOW (ref 4.22–5.81)
RDW: 14.6 % (ref 11.5–15.5)
WBC: 7.6 10*3/uL (ref 4.0–10.5)
nRBC: 0 % (ref 0.0–0.2)

## 2020-10-29 LAB — COMPREHENSIVE METABOLIC PANEL
ALT: 46 U/L — ABNORMAL HIGH (ref 0–44)
AST: 57 U/L — ABNORMAL HIGH (ref 15–41)
Albumin: 2.8 g/dL — ABNORMAL LOW (ref 3.5–5.0)
Alkaline Phosphatase: 76 U/L (ref 38–126)
Anion gap: 11 (ref 5–15)
BUN: 20 mg/dL (ref 8–23)
CO2: 24 mmol/L (ref 22–32)
Calcium: 9.4 mg/dL (ref 8.9–10.3)
Chloride: 102 mmol/L (ref 98–111)
Creatinine, Ser: 1.03 mg/dL (ref 0.61–1.24)
GFR, Estimated: >60 mL/min (ref >60)
Glucose, Bld: 124 mg/dL — ABNORMAL HIGH (ref 70–99)
Potassium: 4.9 mmol/L (ref 3.5–5.1)
Sodium: 137 mmol/L (ref 135–145)
Total Bilirubin: 1.2 mg/dL (ref 0.3–1.2)
Total Protein: 9.5 g/dL — ABNORMAL HIGH (ref 6.5–8.1)

## 2020-10-29 LAB — URINALYSIS, ROUTINE W REFLEX MICROSCOPIC
Bacteria, UA: NONE SEEN
Bilirubin Urine: NEGATIVE
Glucose, UA: NEGATIVE mg/dL
Ketones, ur: NEGATIVE mg/dL
Leukocytes,Ua: NEGATIVE
Nitrite: NEGATIVE
Protein, ur: 30 mg/dL — AB
Specific Gravity, Urine: >1.046 — ABNORMAL HIGH (ref 1.005–1.030)
pH: 5 (ref 5.0–8.0)

## 2020-10-29 IMAGING — CT CT IMAGE GUIDED FLUID DRAIN BY CATHETER
2 of 4 series · 12 of 32 positions shown, 17 images · non-contrast
Comparison: CT abdomen and pelvis-[DATE];

INDICATION: History of ventral abdominal wall hernia repair now with abdominal
wall fluid collection/abscess. Please perform percutaneous drainage
catheter placement for infection source control purposes.

EXAM:
CT IMAGE GUIDED FLUID DRAIN BY CATHETER

[Series 2: i-spiral 5.0 bf37 · axial · 0.98mm/px · z∈[-587,-436]mm · 6 of 61 slices shown, 11 images (1 of 2)]
[im 9/61  soft-tissue]
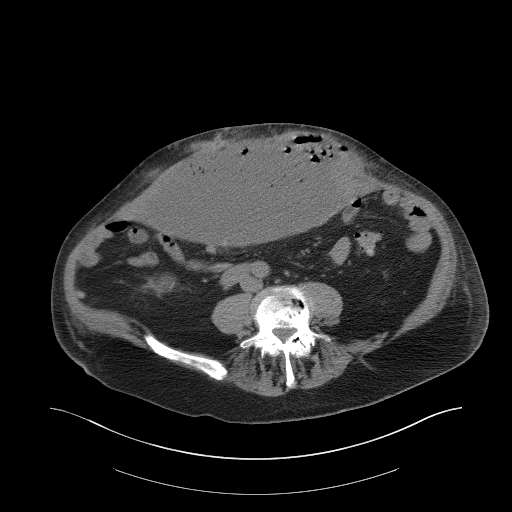
[im 9/61  bone]
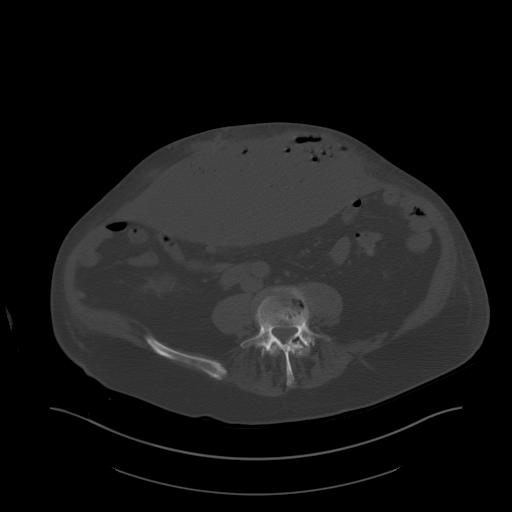
[im 18/61  soft-tissue]
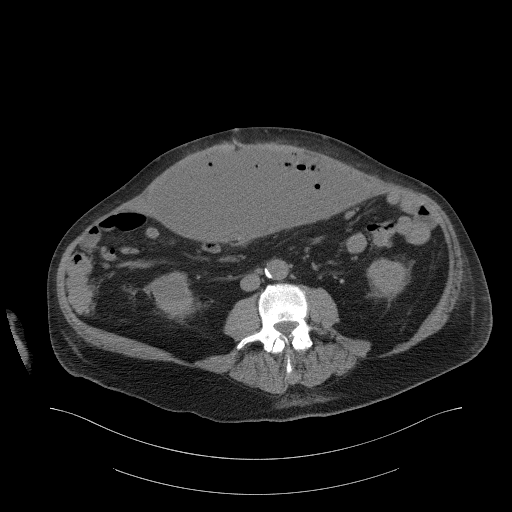
[im 26/61  soft-tissue]
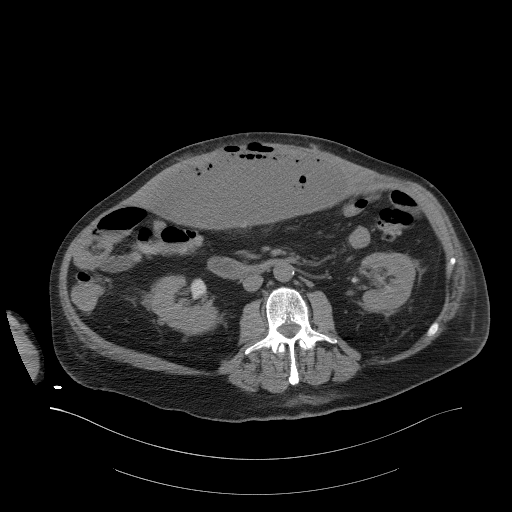
[im 26/61  lung]
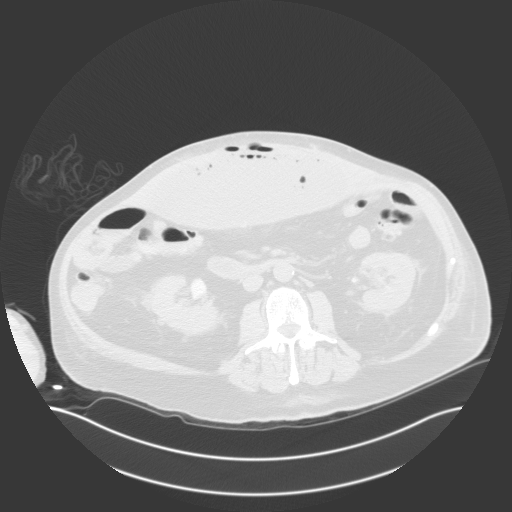
[im 35/61  soft-tissue]
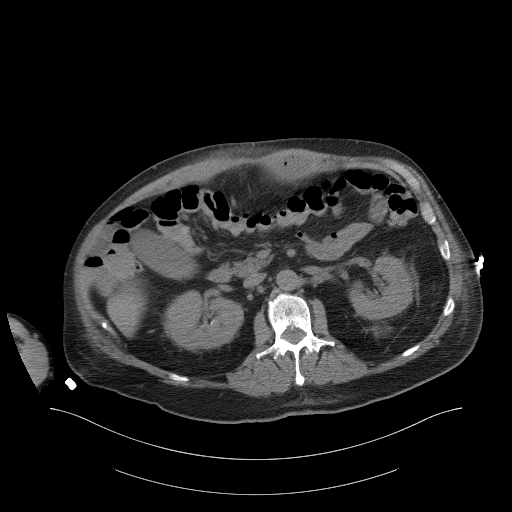
[im 35/61  lung]
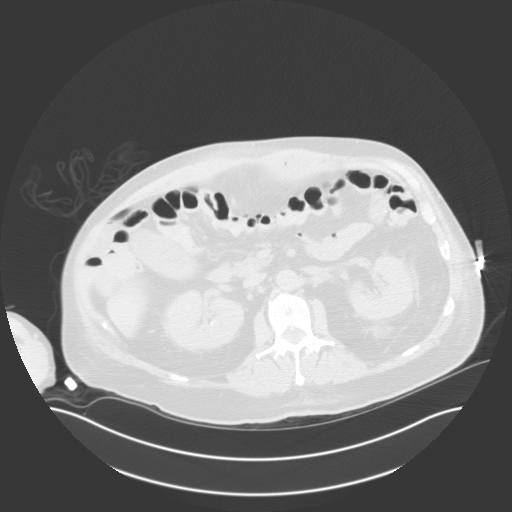
[im 43/61  soft-tissue]
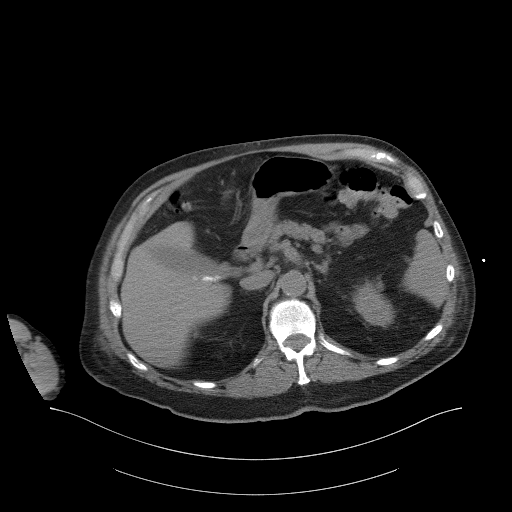
[im 43/61  lung]
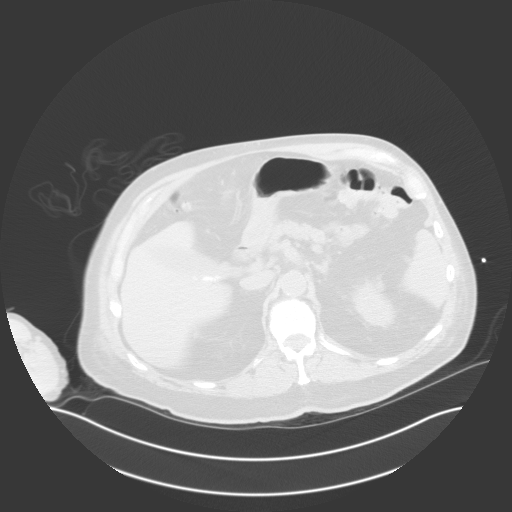
[im 52/61  soft-tissue]
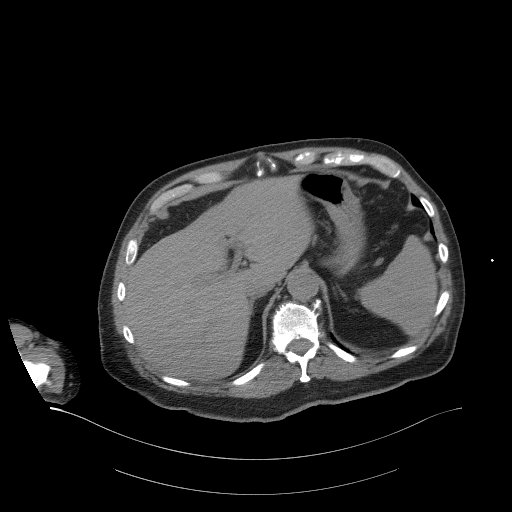
[im 52/61  lung]
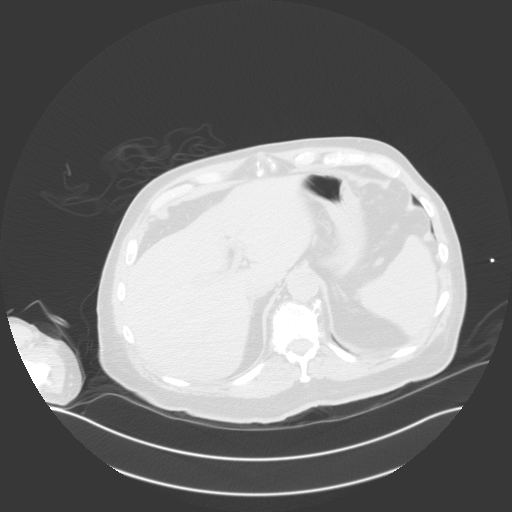

[Series 3: i-spiral 5.0 bf37 · axial · 0.98mm/px · z∈[-658,-511]mm · 6 of 60 slices shown (2 of 2)]
[im 9/60  soft-tissue]
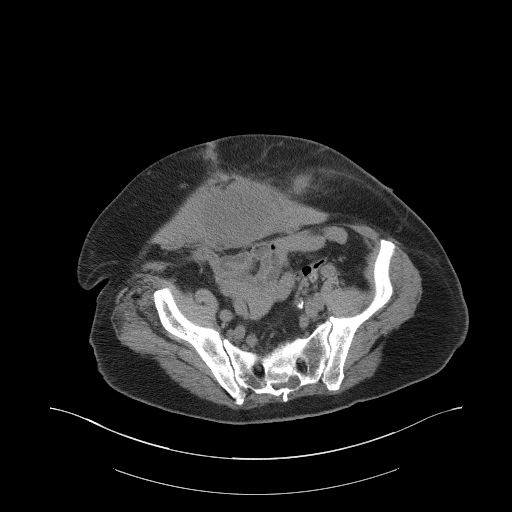
[im 17/60  soft-tissue]
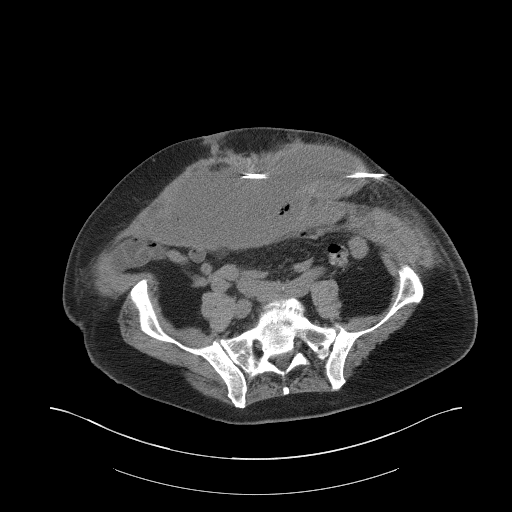
[im 26/60  soft-tissue]
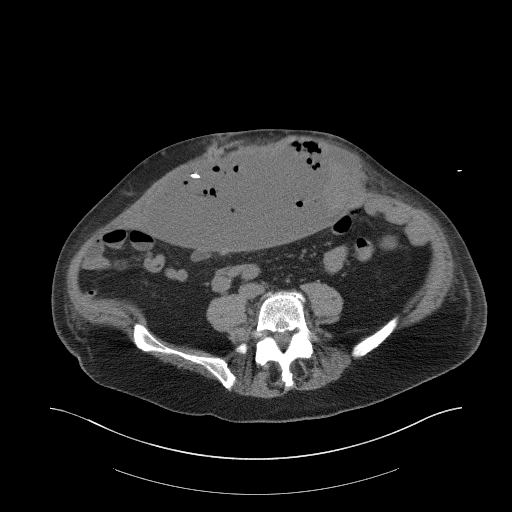
[im 34/60  soft-tissue]
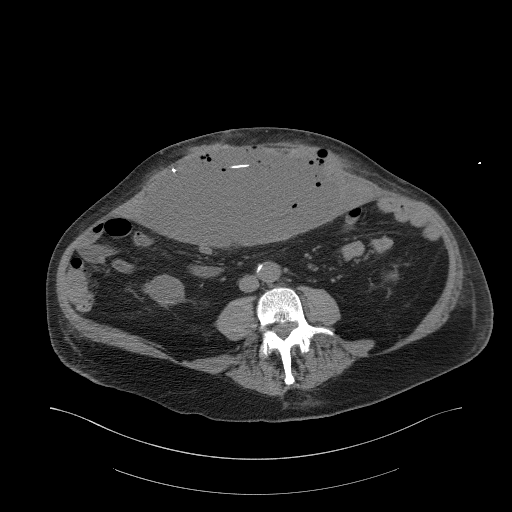
[im 43/60  soft-tissue]
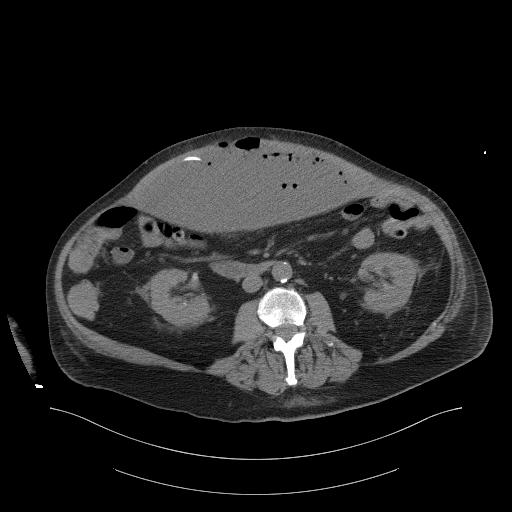
[im 51/60  soft-tissue]
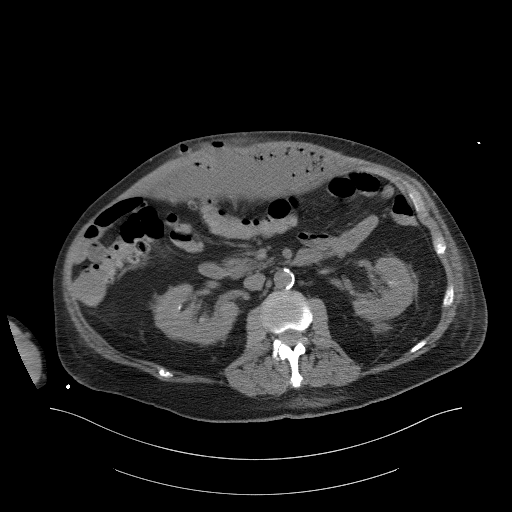

[12 of 32 positions shown; findings below may reference images not displayed]

[DATE]

MEDICATIONS:
The patient is currently admitted to the hospital and receiving
intravenous antibiotics. The antibiotics were administered within an
appropriate time frame prior to the initiation of the procedure.

ANESTHESIA/SEDATION:
Moderate (conscious) sedation was employed during this procedure. A
total of Versed 2 mg and Fentanyl 50 mcg was administered
intravenously.

Moderate Sedation Time: 35 minutes. The patient's level of
consciousness and vital signs were monitored continuously by
radiology nursing throughout the procedure under my direct
supervision.

CONTRAST:  None

COMPLICATIONS:
None immediate.

PROCEDURE:
Informed written consent was obtained from the patient after a
discussion of the risks, benefits and alternatives to treatment. The
patient was placed supine on the CT gantry and a pre procedural CT
was performed re-demonstrating the known abscess/fluid collection
within the ventral abdominal wall with dominant component measuring
at least 21.1 x 10.2 cm (image 54, series 2). The CT gantry position
was marked in the fluid collection was identified sonographically.
The procedure was planned. A timeout was performed prior to the
initiation of the procedure.

The skin overlying the ventral inferolateral aspect of the left side
of the abdomen was prepped and draped in the usual sterile fashion.
The overlying soft tissues were anesthetized with 1% lidocaine with
epinephrine.

Under direct ultrasound guidance, the inferior oral left lateral
component of the fluid collection was accessed with an 18 gauge
trocar needle and a short Amplatz wire was coiled within collection.
Multiple ultrasound images were saved procedural documentation
purposes. Appropriate positioning was confirmed with a limited CT
scan. The tract was serially dilated allowing placement of a 14
French all-purpose drainage catheter. Appropriate positioning was
confirmed with a limited postprocedural CT scan.

Next, approximately 1.3 L of blood-tinged non foul smelling fluid
was aspirated. The tube was connected to a drainage bag and sutured
in place. A dressing was placed. The patient tolerated the procedure
well without immediate post procedural complication.
IMPRESSION: Successful CT guided placement of a 14 French all purpose drain
catheter into the ventral wall fluid collection/abscess, likely a
superinfected hematoma, with aspiration of 1.3 L of purulent fluid.
Samples were sent to the laboratory as requested by the ordering
clinical team.

## 2020-10-29 MED ORDER — ENOXAPARIN SODIUM 40 MG/0.4ML ~~LOC~~ SOLN
40.0000 mg | SUBCUTANEOUS | Status: DC
  Administered 2020-10-30 – 2020-11-01 (×3): 40 mg via SUBCUTANEOUS
  Filled 2020-10-29 (×3): qty 0.4

## 2020-10-29 MED ORDER — MIDAZOLAM HCL 2 MG/2ML IJ SOLN
INTRAMUSCULAR | Status: AC
  Filled 2020-10-29: qty 4

## 2020-10-29 MED ORDER — FENTANYL CITRATE (PF) 100 MCG/2ML IJ SOLN
INTRAMUSCULAR | Status: AC | PRN
  Administered 2020-10-29 (×2): 25 ug via INTRAVENOUS

## 2020-10-29 MED ORDER — FLUMAZENIL 0.5 MG/5ML IV SOLN
INTRAVENOUS | Status: AC
  Filled 2020-10-29: qty 5

## 2020-10-29 MED ORDER — MIDAZOLAM HCL 2 MG/2ML IJ SOLN
INTRAMUSCULAR | Status: AC | PRN
  Administered 2020-10-29: 1 mg via INTRAVENOUS
  Administered 2020-10-29 (×2): 0.5 mg via INTRAVENOUS

## 2020-10-29 MED ORDER — NALOXONE HCL 0.4 MG/ML IJ SOLN
INTRAMUSCULAR | Status: AC
  Filled 2020-10-29: qty 1

## 2020-10-29 MED ORDER — FENTANYL CITRATE (PF) 100 MCG/2ML IJ SOLN
INTRAMUSCULAR | Status: AC
  Filled 2020-10-29: qty 2

## 2020-10-29 MED ORDER — LIDOCAINE HCL (PF) 1 % IJ SOLN
INTRAMUSCULAR | Status: AC | PRN
  Administered 2020-10-29: 10 mL via INTRADERMAL

## 2020-10-29 MED ORDER — SODIUM CHLORIDE 0.9% FLUSH
5.0000 mL | Freq: Three times a day (TID) | INTRAVENOUS | Status: DC
  Administered 2020-10-29 – 2020-11-01 (×9): 5 mL

## 2020-10-29 NOTE — Progress Notes (Signed)
Patient's Temp increased from previous reading.  VS stable but T making him tachycardic.  No apparent distress.  Yellow MEWS protocol continued.

## 2020-10-29 NOTE — Plan of Care (Signed)
Plan of care initiated and discussed with the patient. 

## 2020-10-29 NOTE — Plan of Care (Signed)
  Problem: Education: Goal: Knowledge of General Education information will improve Description: Including pain rating scale, medication(s)/side effects and non-pharmacologic comfort measures Outcome: Progressing   Problem: Health Behavior/Discharge Planning: Goal: Ability to manage health-related needs will improve Outcome: Progressing   Problem: Activity: Goal: Risk for activity intolerance will decrease Outcome: Progressing   

## 2020-10-29 NOTE — Progress Notes (Signed)
Initial Nutrition Assessment  DOCUMENTATION CODES:   Not applicable  INTERVENTION:   -RD will follow for diet advancement and add supplements as appropriate  NUTRITION DIAGNOSIS:   Increased nutrient needs related to post-op healing as evidenced by estimated needs.  GOAL:   Patient will meet greater than or equal to 90% of their needs  MONITOR:   PO intake,Supplement acceptance,Diet advancement,Labs,Weight trends,Skin,I & O's  REASON FOR ASSESSMENT:   Malnutrition Screening Tool    ASSESSMENT:   80 year old male 2 weeks status post complex ventral hernia repair with mesh by Dr. Rosendo Gros.  He has done well until the last few days when he developed a fever to 102, malaise and generalized abdominal pain.  CT scan obtained which shows a large abscess in the anterior abdominal wall.  Pt admitted with post-operative abscess s/p open incisional hernia repair with mesh 2 weeks ago.   Reviewed I/O's: +568 ml x 24 hours  Pt unavailable at time of visit. Attempted to speak with pt via call to hospital room phone, however, unable to reach.   Per IR notes, plan for guided drain placement today. Pt currently NPO.   Reviewed wt hx; wt has been stable over the past year.   Medications reviewed and include dextrose 5%-0.9% sodium chloride infusion @ 75 ml/hr.   Labs reviewed.   Diet Order:   Diet Order            Diet NPO time specified Except for: Ice Chips  Diet effective now                 EDUCATION NEEDS:   No education needs have been identified at this time  Skin:  Skin Assessment: Skin Integrity Issues: Skin Integrity Issues:: Incisions Incisions: closed abdomen  Last BM:  10/28/20  Height:   Ht Readings from Last 1 Encounters:  10/29/20 5\' 9"  (1.753 m)    Weight:   Wt Readings from Last 1 Encounters:  10/29/20 88.6 kg    Ideal Body Weight:  72.7 kg  BMI:  Body mass index is 28.84 kg/m.  Estimated Nutritional Needs:   Kcal:   2100-2300  Protein:  110-125 grams  Fluid:  > 2 L    Loistine Chance, RD, LDN, [place] Registered Dietitian II Certified Diabetes Care and Education Specialist Please refer to [facility] for RD and/or RD on-call/weekend/after hours pager

## 2020-10-29 NOTE — Progress Notes (Signed)
Chief Complaint: Patient was seen in consultation today for abdominal wall abscess/seroma  Referring Physician(s): Dr. Johney Maine  Supervising Physician: Sandi Mariscal  Patient Status: [facility] - In-pt  History of Present Illness: Wesley [name] is a 80 y.o. male who underwent large ventral hernia repair with mesh a couple weeks ago, had been doing well until a couple days ago, starting having fevers and malaise. Has been admitted after CT finds large abdominal wall seroma/abscess. IR is asked place perc drain. PMHx, meds, labs, imaging, allergies reviewed. Has been NPO today as directed.    Past Medical History:  Diagnosis Date  . 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 CAD (coronary artery disease)   . Cholelithiasis   . Chronic allergic rhinitis   . Coronary atherosclerosis of native coronary artery   . Fatty liver   . GERD (gastroesophageal reflux disease)   . High blood pressure   . Hypercholesterolemia   . Macrocytosis without anemia   . OSA on CPAP   . Paraesophageal hernia   . Partial bowel obstruction ([place])   . 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 Sleep apnea   . Umbilical hernia without obstruction or gangrene     Past Surgical History:  Procedure Laterality Date  . BIOPSY  10/29/2019   Procedure: BIOPSY;  Surgeon: Jerene Bears, MD;  Location: [facility];  Service: Gastroenterology;;  . CARDIAC CATHETERIZATION  2000   Cath to rule out cardiac problems RT HTN. PT denies significant findings  . CARDIAC CATHETERIZATION  2008  . COLOSTOMY N/A 10/31/2019   Procedure: COLOSTOMY;  Surgeon: Erroll Luna, MD;  Location: [facility];  Service: General;  Laterality: N/A;  . CYSTOSCOPY W/ URETERAL STENT PLACEMENT Bilateral 10/31/2019   Procedure: CYSTOSCOPY URETERAL STENT PLACEMENT BILATERAL;  Surgeon: Franchot Gallo, MD;  Location: [facility];  Service: Urology;  Laterality: Bilateral;  . CYSTOSCOPY WITH STENT PLACEMENT  Bilateral 03/11/2020   Procedure: CYSTOSCOPY WITH BILATERAL FIREFLY INJECTION;  Surgeon: Irine Seal, MD;  Location: [facility];  Service: Urology;  Laterality: Bilateral;  . EYE SURGERY     BILATERAL CATARACT SURGERY WITH LENS IMPLANTS  . FLEXIBLE SIGMOIDOSCOPY N/A 10/29/2019   Procedure: FLEXIBLE SIGMOIDOSCOPY;  Surgeon: Jerene Bears, MD;  Location: [facility];  Service: Gastroenterology;  Laterality: N/A;  . INCISIONAL HERNIA REPAIR N/A 10/13/2020   Procedure: OPEN INCISIONAL HERNIA REPAIR WITH MESH;  Surgeon: [name], MD;  Location: [place];  Service: General;  Laterality: N/A;  . INSERTION OF MESH N/A 09/08/2016   Procedure: INSERTION OF MESH;  Surgeon: Jackolyn Confer, MD;  Location: [facility];  Service: General;  Laterality: N/A;  . LAPAROSCOPIC SIGMOID COLECTOMY N/A 10/31/2019   Procedure: DIAGNOSTIC LAPAROSCOPY; EXPLORATORY LAPAROTOMY; SIGMOID COLECTOMY;  Surgeon: Erroll Luna, MD;  Location: [facility];  Service: General;  Laterality: N/A;  . right rotator cuff    . ROTATOR CUFF REPAIR    . SUBMUCOSAL TATTOO INJECTION  10/29/2019   Procedure: SUBMUCOSAL TATTOO INJECTION;  Surgeon: Jerene Bears, MD;  Location: [facility];  Service: Gastroenterology;;  . TONSILLECTOMY    . UMBILICAL HERNIA REPAIR N/A 09/08/2016   Procedure: OPEN UMBILICAL HERNIA REPAIR WITH MESH;  Surgeon: Jackolyn Confer, MD;  Location: [facility];  Service: General;  Laterality: N/A;  . XI ROBOTIC ASSISTED COLOSTOMY TAKEDOWN N/A 03/11/2020   Procedure: ROBOTIC ASSISTED COLOSTOMY REVERSAL, RIGID PROCTOSCOPY;  Surgeon: Leighton Ruff, MD;  Location: [facility];  Service: General;  Laterality: N/A;  Allergies: Demerol and Promethazine hcl  Medications:  Current Facility-Administered Medications:  .  acetaminophen (TYLENOL) tablet 650 mg, 650 mg, Oral, Q6H PRN, 650 mg at 10/29/20 0524 **OR** acetaminophen (TYLENOL) suppository 650 mg, 650 mg, Rectal, Q6H PRN, Cornett, Thomas, MD .  atorvastatin (LIPITOR) tablet 80 mg, 80  mg, Oral, Daily, Cornett, Thomas, MD, 80 mg at 10/28/20 2255 .  dextrose 5 %-0.9 % sodium chloride infusion, , Intravenous, Continuous, Cornett, Thomas, MD, Last Rate: 75 mL/hr at 10/29/20 0013, New Bag at 10/29/20 0013 .  enoxaparin (LOVENOX) injection 40 mg, 40 mg, Subcutaneous, Q24H, Cornett, Thomas, MD, 40 mg at 10/28/20 2255 .  finasteride (PROSCAR) tablet 5 mg, 5 mg, Oral, q morning, Cornett, Thomas, MD .  fluticasone (FLONASE) 50 MCG/ACT nasal spray 2 spray, 2 spray, Each Nare, Daily PRN, Cornett, Thomas, MD .  HYDROmorphone (DILAUDID) injection 1 mg, 1 mg, Intravenous, Q2H PRN, Cornett, Thomas, MD .  lisinopril (ZESTRIL) tablet 10 mg, 10 mg, Oral, q morning, Cornett, Thomas, MD .  loratadine (CLARITIN) tablet 10 mg, 10 mg, Oral, Daily, Cornett, Thomas, MD, 10 mg at 10/28/20 2255 .  ondansetron (ZOFRAN-ODT) disintegrating tablet 4 mg, 4 mg, Oral, Q6H PRN **OR** ondansetron (ZOFRAN) injection 4 mg, 4 mg, Intravenous, Q6H PRN, Cornett, Thomas, MD .  oxyCODONE (Oxy IR/ROXICODONE) immediate release tablet 5-10 mg, 5-10 mg, Oral, Q4H PRN, Cornett, Thomas, MD .  piperacillin-tazobactam (ZOSYN) IVPB 3.375 g, 3.375 g, Intravenous, Q8H, Cornett, Thomas, MD, Last Rate: 12.5 mL/hr at 10/29/20 0301, 3.375 g at 10/29/20 0301 .  traMADol (ULTRAM) tablet 50 mg, 50 mg, Oral, Q6H PRN, Cornett, Thomas, MD, 50 mg at 10/29/20 0013    Family History  Problem Relation Age of Onset  . Diabetes Mother   . CAD Father   . Melanoma Brother 27  . Healthy Daughter   . Healthy Son   . Colon cancer Neg Hx   . Esophageal cancer Neg Hx   . Rectal cancer Neg Hx   . Stomach cancer Neg Hx     Social History   Socioeconomic History  . Marital status: Married    Spouse name: Not on file  . Number of children: Not on file  . Years of education: Not on file  . Highest education level: Not on file  Occupational History  . Not on file  Tobacco Use  . Smoking status: Never Smoker  . Smokeless tobacco: Never Used   Vaping Use  . Vaping Use: Never used  Substance and Sexual Activity  . Alcohol use: Yes    Alcohol/week: 14.0 standard drinks    Types: 14 drink(s) per week    Comment: or more  . Drug use: No  . Sexual activity: Not on file  Other Topics Concern  . Not on file  Social History Narrative  . Not on file   Social Determinants of Health   Financial Resource Strain: Not on file  Food Insecurity: Not on file  Transportation Needs: Not on file  Physical Activity: Not on file  Stress: Not on file  Social Connections: Not on file     Review of Systems: A 12 point ROS discussed and pertinent positives are indicated in the HPI above.  All other systems are negative.  Review of Systems  Vital Signs: BP 118/78 (BP Location: Right Arm)   Pulse 99   Temp 100.2 F (37.9 C) (Oral)   Resp 18   Ht 5\' 9"  (1.753 m)   Wt 88.6 kg  SpO2 93%   BMI 28.84 kg/m   Physical Exam Constitutional:      Appearance: Normal appearance. He is not ill-appearing.  HENT:     Mouth/Throat:     Mouth: Mucous membranes are moist.     Pharynx: Oropharynx is clear.  Cardiovascular:     Rate and Rhythm: Normal rate and regular rhythm.     Heart sounds: Normal heart sounds.  Pulmonary:     Effort: Pulmonary effort is normal.     Breath sounds: Normal breath sounds.  Abdominal:     Palpations: Abdomen is soft.     Comments: Tender across mid abdomen  Skin:    General: Skin is warm and dry.  Neurological:     General: No focal deficit present.     Mental Status: He is alert and oriented to person, place, and time.      Imaging: CT ABDOMEN PELVIS W CONTRAST  Result Date: 10/28/2020 CLINICAL DATA:  History of hernia repair on October 13, 2020 with subsequent abdominal pain and fever. EXAM: CT ABDOMEN AND PELVIS WITH CONTRAST TECHNIQUE: Multidetector CT imaging of the abdomen and pelvis was performed using the standard protocol following bolus administration of intravenous contrast. CONTRAST:  154mL  OMNIPAQUE IOHEXOL 300 MG/ML  SOLN COMPARISON:  July 15, 2020 FINDINGS: Lower chest: Mild linear scarring and/or atelectasis is seen within the right lung base. Hepatobiliary: There is diffuse fatty infiltration of the liver parenchyma. No focal liver abnormality is seen. Multiple subcentimeter gallstones are seen within the lumen of an otherwise normal-appearing gallbladder. Pancreas: Unremarkable. No pancreatic ductal dilatation or surrounding inflammatory changes. Spleen: Normal in size without focal abnormality. Adrenals/Urinary Tract: Adrenal glands are unremarkable. Kidneys are normal, without renal calculi or hydronephrosis. A 3.7 cm x 2.6 cm cyst is seen within the lower pole of the right kidney. Bladder is unremarkable. Stomach/Bowel: Stomach is within normal limits. Appendix appears normal. No evidence of bowel wall thickening, distention, or inflammatory changes. Noninflamed diverticula are seen throughout the large bowel. Vascular/Lymphatic: Aortic atherosclerosis. No enlarged abdominal or pelvic lymph nodes. Reproductive: Prostate gland is mildly enlarged. Other: A very large, approximately 18.5 cm x 8.8 cm x 22.8 cm collection of fluid and air, with thin, surrounding hyperdense rim, is seen within the anterior abdominal wall, along the midline. This extends inferiorly along the anterior pelvic wall. Associated subcutaneous and para muscular inflammatory fat stranding is noted. Musculoskeletal: Degenerative changes seen within the lower lumbar spine. IMPRESSION: 1. Very large anterior abdominal wall abscess, as described above. 2. Cholelithiasis. 3. Colonic diverticulosis. 4. Aortic atherosclerosis. 5. Mildly enlarged prostate gland. Aortic Atherosclerosis (ICD10-I70.0). Electronically Signed   By: Virgina Norfolk M.D.   On: 10/28/2020 19:52   DG Chest Port 1 View  Result Date: 10/28/2020 CLINICAL DATA:  Fever and weakness. Questionable sepsis - evaluate for abnormality EXAM: PORTABLE CHEST 1  VIEW COMPARISON:  08/12/2013 FINDINGS: The cardiomediastinal contours are normal. Subsegmental atelectasis at the left lung base and right infrahilar region. Pulmonary vasculature is normal. Mild right greater than left biapical pleuroparenchymal scarring. No consolidation, pleural effusion, or pneumothorax. No acute osseous abnormalities are seen. IMPRESSION: Subsegmental atelectasis in the left lung base and right infrahilar region. No focal consolidation. Electronically Signed   By: Keith Rake M.D.   On: 10/28/2020 18:36    Labs:  CBC: Recent Labs    10/06/20 1400 10/14/20 0451 10/28/20 1835 10/29/20 0316  WBC 6.0 14.2* 7.7 7.6  HGB 13.9 13.5 11.7* 12.9*  HCT 42.6 41.2 35.7*  40.0  PLT 191 179 333 373    COAGS: Recent Labs    10/28/20 1835  INR 1.2  APTT 30    BMP: Recent Labs    03/01/20 1328 03/12/20 0511 03/13/20 0543 03/14/20 0553 07/05/20 1529 10/06/20 1400 10/28/20 1835 10/29/20 0316  NA 139 138 136 133* 134* 135 132* 137  K 5.4* 5.1 4.2 4.1 4.0 4.4 4.3 4.9  CL 105 104 105 102 101 102 99 102  CO2 27 24 25 24 25 25 25 24   GLUCOSE 99 127* 96 98 100* 113* 119* 124*  BUN 17 9 14 16 19 15 23 20   CALCIUM 9.5 8.7* 8.7* 8.6* 9.3 9.1 8.8* 9.4  CREATININE 1.07 0.84 0.86 0.72 0.86 0.95 0.94 1.03  GFRNONAA >60 >60 >60 >60  --  >60 >60 >60  GFRAA >60 >60 >60 >60  --   --   --   --     LIVER FUNCTION TESTS: Recent Labs    11/02/19 0541 10/06/20 1400 10/28/20 1835 10/29/20 0316  BILITOT 1.2 1.1 0.7 1.2  AST 23 30 50* 57*  ALT 21 35 43 46*  ALKPHOS 52 81 69 76  PROT 5.3* 9.1* 8.5* 9.5*  ALBUMIN 2.6* 3.5 2.6* 2.8*    TUMOR MARKERS: No results for input(s): AFPTM, CEA, CA199, CHROMGRNA in the last 8760 hours.  Assessment and Plan: Abdominal wall seroma/abscess Plan for image guided drain(s) placement Labs reviewed. Risks and benefits discussed with the patient including bleeding, infection, damage to adjacent structures, bowel perforation/fistula  connection, and sepsis.  All of the patient's questions were answered, patient is agreeable to proceed. Consent signed and in chart.    Thank you for this interesting consult.  I greatly enjoyed meeting Wesley [name] and look forward to participating in their care.  A copy of this report was sent to the requesting provider on this date.  Electronically Signed: Ascencion Dike, PA-C 10/29/2020, 10:23 AM   I spent a total of 20 minutes in face to face in clinical consultation, greater than 50% of which was counseling/coordinating care for abd wall drains placement

## 2020-10-29 NOTE — Procedures (Signed)
Pre procedural Dx: Post-op abscess Post procedural Dx: Same  Technically successful CT guided placed of a 10 Fr drainage catheter placement into the anterior abdominal wall abscess yielding 1.3 L of bloody fluid.    A representative aspirated sample was capped and sent to the laboratory for analysis.    EBL: Trace Complications: None immediate  Ronny Bacon, MD Pager #: [phone]

## 2020-10-29 NOTE — Progress Notes (Signed)
MEDICATION-RELATED CONSULT NOTE   IR Procedure Consult - Anticoagulant/Antiplatelet PTA/Inpatient Med List Review by Pharmacist    Procedure: CT guided placed of a 10 Fr drainage catheter placement into the anterior abdominal wall abscess    Completed: 3/18 13:09  Post-Procedural bleeding risk per IR MD assessment:  Standard  Antithrombotic medications on inpatient or PTA profile prior to procedure:   Enoxaparin 40 mg SQ Q24h.  Last dose on 3/17 at 22:55.    Recommended restart time per IR Post-Procedure Guidelines:  Day + 1 (Next AM)  Plan:     Resume Lovenox 40 mg SQ Q24h - next dose on 3/19 at 10:00   Gretta Arab PharmD, [facility] main pharmacy [phone] 10/29/2020 1:57 PM

## 2020-10-30 NOTE — Plan of Care (Signed)
  Problem: Nutrition: Goal: Adequate nutrition will be maintained Outcome: Progressing   Problem: Pain Managment: Goal: General experience of comfort will improve Outcome: Progressing   

## 2020-10-30 NOTE — Plan of Care (Signed)
  Problem: Pain Managment: Goal: General experience of comfort will improve Outcome: Progressing   

## 2020-10-30 NOTE — Progress Notes (Signed)
Subjective/Chief Complaint: Pt feeling much better since drain was placed.  Still with some low grade temps, but no overt fevers.     Objective: Vital signs in last 24 hours: Temp:  [97.5 F (36.4 C)-99.3 F (37.4 C)] 99.3 F (37.4 C) (03/19 0700) Pulse Rate:  [74-87] 87 (03/19 0550) Resp:  [17-21] 21 (03/19 0550) BP: (101-149)/(67-98) 149/98 (03/19 0550) SpO2:  [96 %-100 %] 99 % (03/19 0550) Last BM Date: 10/30/20  Intake/Output from previous day: 03/18 0701 - 03/19 0700 In: 1063.9 [P.O.:120; I.V.:814.6; IV Piggyback:124.2] Out: 1400 [Drains:1400] Intake/Output this shift: Total I/O In: 126.7 [P.O.:120; I.V.:0.1; IV Piggyback:6.6] Out: -   General appearance: alert, cooperative and no distress Resp: breathing comfortably GI: soft, non distended, approp tender.  drain looks like old blood.   Extremities: extremities normal, atraumatic, no cyanosis or edema  Lab Results:  Recent Labs    10/28/20 1835 10/29/20 0316  WBC 7.7 7.6  HGB 11.7* 12.9*  HCT 35.7* 40.0  PLT 333 373   BMET Recent Labs    10/28/20 1835 10/29/20 0316  NA 132* 137  K 4.3 4.9  CL 99 102  CO2 25 24  GLUCOSE 119* 124*  BUN 23 20  CREATININE 0.94 1.03  CALCIUM 8.8* 9.4   PT/INR Recent Labs    10/28/20 1835  LABPROT 14.8  INR 1.2   ABG No results for input(s): PHART, HCO3 in the last 72 hours.  Invalid input(s): PCO2, PO2  Studies/Results: CT ABDOMEN PELVIS W CONTRAST  Result Date: 10/28/2020 CLINICAL DATA:  History of hernia repair on October 13, 2020 with subsequent abdominal pain and fever. EXAM: CT ABDOMEN AND PELVIS WITH CONTRAST TECHNIQUE: Multidetector CT imaging of the abdomen and pelvis was performed using the standard protocol following bolus administration of intravenous contrast. CONTRAST:  190mL OMNIPAQUE IOHEXOL 300 MG/ML  SOLN COMPARISON:  July 15, 2020 FINDINGS: Lower chest: Mild linear scarring and/or atelectasis is seen within the right lung base.  Hepatobiliary: There is diffuse fatty infiltration of the liver parenchyma. No focal liver abnormality is seen. Multiple subcentimeter gallstones are seen within the lumen of an otherwise normal-appearing gallbladder. Pancreas: Unremarkable. No pancreatic ductal dilatation or surrounding inflammatory changes. Spleen: Normal in size without focal abnormality. Adrenals/Urinary Tract: Adrenal glands are unremarkable. Kidneys are normal, without renal calculi or hydronephrosis. A 3.7 cm x 2.6 cm cyst is seen within the lower pole of the right kidney. Bladder is unremarkable. Stomach/Bowel: Stomach is within normal limits. Appendix appears normal. No evidence of bowel wall thickening, distention, or inflammatory changes. Noninflamed diverticula are seen throughout the large bowel. Vascular/Lymphatic: Aortic atherosclerosis. No enlarged abdominal or pelvic lymph nodes. Reproductive: Prostate gland is mildly enlarged. Other: A very large, approximately 18.5 cm x 8.8 cm x 22.8 cm collection of fluid and air, with thin, surrounding hyperdense rim, is seen within the anterior abdominal wall, along the midline. This extends inferiorly along the anterior pelvic wall. Associated subcutaneous and para muscular inflammatory fat stranding is noted. Musculoskeletal: Degenerative changes seen within the lower lumbar spine. IMPRESSION: 1. Very large anterior abdominal wall abscess, as described above. 2. Cholelithiasis. 3. Colonic diverticulosis. 4. Aortic atherosclerosis. 5. Mildly enlarged prostate gland. Aortic Atherosclerosis (ICD10-I70.0). Electronically Signed   By: Virgina Norfolk M.D.   On: 10/28/2020 19:52   DG Chest Port 1 View  Result Date: 10/28/2020 CLINICAL DATA:  Fever and weakness. Questionable sepsis - evaluate for abnormality EXAM: PORTABLE CHEST 1 VIEW COMPARISON:  08/12/2013 FINDINGS: The cardiomediastinal contours are normal.  Subsegmental atelectasis at the left lung base and right infrahilar region.  Pulmonary vasculature is normal. Mild right greater than left biapical pleuroparenchymal scarring. No consolidation, pleural effusion, or pneumothorax. No acute osseous abnormalities are seen. IMPRESSION: Subsegmental atelectasis in the left lung base and right infrahilar region. No focal consolidation. Electronically Signed   By: Keith Rake M.D.   On: 10/28/2020 18:36   CT IMAGE GUIDED FLUID DRAIN BY CATHETER  Result Date: 10/29/2020 INDICATION: History of ventral abdominal wall hernia repair now with abdominal wall fluid collection/abscess. Please perform percutaneous drainage catheter placement for infection source control purposes. EXAM: CT IMAGE GUIDED FLUID DRAIN BY CATHETER COMPARISON:  CT abdomen and pelvis-10/28/2020; 07/15/2020 MEDICATIONS: The patient is currently admitted to the hospital and receiving intravenous antibiotics. The antibiotics were administered within an appropriate time frame prior to the initiation of the procedure. ANESTHESIA/SEDATION: Moderate (conscious) sedation was employed during this procedure. A total of Versed 2 mg and Fentanyl 50 mcg was administered intravenously. Moderate Sedation Time: 35 minutes. The patient's level of consciousness and vital signs were monitored continuously by radiology nursing throughout the procedure under my direct supervision. CONTRAST:  None COMPLICATIONS: None immediate. PROCEDURE: Informed written consent was obtained from the patient after a discussion of the risks, benefits and alternatives to treatment. The patient was placed supine on the CT gantry and a pre procedural CT was performed re-demonstrating the known abscess/fluid collection within the ventral abdominal wall with dominant component measuring at least 21.1 x 10.2 cm (image 54, series 2). The CT gantry position was marked in the fluid collection was identified sonographically. The procedure was planned. A timeout was performed prior to the initiation of the procedure. The  skin overlying the ventral inferolateral aspect of the left side of the abdomen was prepped and draped in the usual sterile fashion. The overlying soft tissues were anesthetized with 1% lidocaine with epinephrine. Under direct ultrasound guidance, the inferior oral left lateral component of the fluid collection was accessed with an 18 gauge trocar needle and a short Amplatz wire was coiled within collection. Multiple ultrasound images were saved procedural documentation purposes. Appropriate positioning was confirmed with a limited CT scan. The tract was serially dilated allowing placement of a 14 French all-purpose drainage catheter. Appropriate positioning was confirmed with a limited postprocedural CT scan. Next, approximately 1.3 L of blood-tinged non foul smelling fluid was aspirated. The tube was connected to a drainage bag and sutured in place. A dressing was placed. The patient tolerated the procedure well without immediate post procedural complication. IMPRESSION: Successful CT guided placement of a 32 French all purpose drain catheter into the ventral wall fluid collection/abscess, likely a superinfected hematoma, with aspiration of 1.3 L of purulent fluid. Samples were sent to the laboratory as requested by the ordering clinical team. Electronically Signed   By: Sandi Mariscal M.D.   On: 10/29/2020 15:59    Anti-infectives: Anti-infectives (From admission, onward)   Start     Dose/Rate Route Frequency Ordered Stop   10/29/20 0300  piperacillin-tazobactam (ZOSYN) IVPB 3.375 g        3.375 g 12.5 mL/hr over 240 Minutes Intravenous Every 8 hours 10/28/20 2122     10/28/20 2030  piperacillin-tazobactam (ZOSYN) IVPB 3.375 g        3.375 g 100 mL/hr over 30 Minutes Intravenous  Once 10/28/20 2018 10/28/20 2100   10/28/20 2015  vancomycin (VANCOREADY) IVPB 1750 mg/350 mL        1,750 mg 175 mL/hr over 120  Minutes Intravenous  Once 10/28/20 2006 10/28/20 2333   10/28/20 2015  cefTRIAXone (ROCEPHIN) 2  g in sodium chloride 0.9 % 100 mL IVPB  Status:  Discontinued        2 g 200 mL/hr over 30 Minutes Intravenous  Once 10/28/20 2006 10/28/20 2018      Assessment/Plan:  S/p open ventral hernia repair Dr. Rosendo Gros 10/13/2020 Readmitted with abdominal wall hematoma that was presumed infected given fever and pain.  advance diet  IV antibiotics Await cultures Possible d/c tomorrow if doing well.  Will do drain teaching.    LOS: 2 days    Stark Klein 10/30/2020

## 2020-10-30 NOTE — Plan of Care (Signed)
Will continue to monitor and intervene appropriately to improve patient care.

## 2020-10-31 MED ORDER — VANCOMYCIN HCL 1750 MG/350ML IV SOLN
1750.0000 mg | Freq: Once | INTRAVENOUS | Status: AC
  Administered 2020-10-31: 1750 mg via INTRAVENOUS
  Filled 2020-10-31: qty 350

## 2020-10-31 MED ORDER — VANCOMYCIN HCL 1750 MG/350ML IV SOLN
1750.0000 mg | INTRAVENOUS | Status: DC
  Filled 2020-10-31: qty 350

## 2020-10-31 MED ORDER — METRONIDAZOLE IN NACL 5-0.79 MG/ML-% IV SOLN
500.0000 mg | Freq: Three times a day (TID) | INTRAVENOUS | Status: DC
  Administered 2020-10-31 – 2020-11-01 (×3): 500 mg via INTRAVENOUS
  Filled 2020-10-31 (×3): qty 100

## 2020-10-31 MED ORDER — SODIUM CHLORIDE 0.9 % IV SOLN
2.0000 g | Freq: Three times a day (TID) | INTRAVENOUS | Status: DC
  Administered 2020-10-31 – 2020-11-01 (×3): 2 g via INTRAVENOUS
  Filled 2020-10-31 (×4): qty 2

## 2020-10-31 NOTE — Progress Notes (Signed)
Pharmacy Antibiotic Note  Wesley [name] is a 80 y.o. male admitted on 10/28/2020 with post-operative abscess.  Pharmacy has been consulted for vancomycin dosing.  Pt currently on piperacillin/tazobactam for IAI. Wound culture growing abundant staph aureus. Pharmacy consulted for vancomycin dosing. Per discussion with MD, will change pip/tazo to cefepime + metronidazole to reduce risk of AKI in combination with vancomycin.   Today, 10/31/20  WBC WNL  Afebrile  SCr 1.03, CrCl ~64 mL/min.   Plan:  Discontinue piperacillin/tazobactam  Order cefepime 2 g IV q8h + metronidazole 500 mg IV q8h  Vancomycin 1750 mg IV q24h  Follow renal function, culture data for sensitivities and ability to de-escalate antibiotics.    Height: 5\' 9"  (175.3 cm) Weight: 88.6 kg (195 lb 5.2 oz) IBW/kg (Calculated) : 70.7  Temp (24hrs), Avg:98.2 F (36.8 C), Min:97.9 F (36.6 C), Max:98.3 F (36.8 C)  Recent Labs  Lab 10/28/20 1835 10/29/20 0316  WBC 7.7 7.6  CREATININE 0.94 1.03  LATICACIDVEN 1.9  --     Estimated Creatinine Clearance: 64.1 mL/min (by C-G formula based on SCr of 1.03 mg/dL).    Allergies  Allergen Reactions  . Demerol Other (See Comments)    Causes dizziness  . Promethazine Hcl Other (See Comments)    Dizziness     Antimicrobials this admission: vancomycin 3/20 >>  cefepime 3/20 >>  Metronidazole 3/20 >> Piperacillin/tazobactam 3/17 > 3/20  Dose adjustments this admission:  Microbiology results: 3/17 BCx: ngtd 3/17 UCx: 1K proteus mirabilis  3/18 wound: abundant staph aureus  Thank you for allowing pharmacy to be a part of this patient's care.  Lenis Noon, PharmD, BCPS 10/31/2020 9:35 AM

## 2020-10-31 NOTE — Progress Notes (Signed)
Subjective/Chief Complaint: Pt a bit more sore yesterday, but was moving more.  Drain dumped out more.   Tolerated soft diet.   Objective: Vital signs in last 24 hours: Temp:  [97.9 F (36.6 C)-98.3 F (36.8 C)] 97.9 F (36.6 C) (03/20 0518) Pulse Rate:  [68-85] 68 (03/20 0518) Resp:  [16-18] 16 (03/20 0518) BP: (124-127)/(78-90) 126/90 (03/20 0518) SpO2:  [97 %-99 %] 97 % (03/20 0518) Last BM Date: 10/30/20  Intake/Output from previous day: 03/19 0701 - 03/20 0700 In: 899.8 [P.O.:120; I.V.:634.1; IV Piggyback:140.7] Out: 475 [Drains:475] Intake/Output this shift: No intake/output data recorded.  General appearance: alert, cooperative and no distress Resp: breathing comfortably GI: soft, non distended, approp tender.  drain looks like old blood mixed with pus.   Extremities: extremities normal, atraumatic, no cyanosis or edema  Lab Results:  Recent Labs    10/28/20 1835 10/29/20 0316  WBC 7.7 7.6  HGB 11.7* 12.9*  HCT 35.7* 40.0  PLT 333 373   BMET Recent Labs    10/28/20 1835 10/29/20 0316  NA 132* 137  K 4.3 4.9  CL 99 102  CO2 25 24  GLUCOSE 119* 124*  BUN 23 20  CREATININE 0.94 1.03  CALCIUM 8.8* 9.4   PT/INR Recent Labs    10/28/20 1835  LABPROT 14.8  INR 1.2   ABG No results for input(s): PHART, HCO3 in the last 72 hours.  Invalid input(s): PCO2, PO2  Studies/Results: CT IMAGE GUIDED FLUID DRAIN BY CATHETER  Result Date: 10/29/2020 INDICATION: History of ventral abdominal wall hernia repair now with abdominal wall fluid collection/abscess. Please perform percutaneous drainage catheter placement for infection source control purposes. EXAM: CT IMAGE GUIDED FLUID DRAIN BY CATHETER COMPARISON:  CT abdomen and pelvis-10/28/2020; 07/15/2020 MEDICATIONS: The patient is currently admitted to the hospital and receiving intravenous antibiotics. The antibiotics were administered within an appropriate time frame prior to the initiation of the  procedure. ANESTHESIA/SEDATION: Moderate (conscious) sedation was employed during this procedure. A total of Versed 2 mg and Fentanyl 50 mcg was administered intravenously. Moderate Sedation Time: 35 minutes. The patient's level of consciousness and vital signs were monitored continuously by radiology nursing throughout the procedure under my direct supervision. CONTRAST:  None COMPLICATIONS: None immediate. PROCEDURE: Informed written consent was obtained from the patient after a discussion of the risks, benefits and alternatives to treatment. The patient was placed supine on the CT gantry and a pre procedural CT was performed re-demonstrating the known abscess/fluid collection within the ventral abdominal wall with dominant component measuring at least 21.1 x 10.2 cm (image 54, series 2). The CT gantry position was marked in the fluid collection was identified sonographically. The procedure was planned. A timeout was performed prior to the initiation of the procedure. The skin overlying the ventral inferolateral aspect of the left side of the abdomen was prepped and draped in the usual sterile fashion. The overlying soft tissues were anesthetized with 1% lidocaine with epinephrine. Under direct ultrasound guidance, the inferior oral left lateral component of the fluid collection was accessed with an 18 gauge trocar needle and a short Amplatz wire was coiled within collection. Multiple ultrasound images were saved procedural documentation purposes. Appropriate positioning was confirmed with a limited CT scan. The tract was serially dilated allowing placement of a 14 French all-purpose drainage catheter. Appropriate positioning was confirmed with a limited postprocedural CT scan. Next, approximately 1.3 L of blood-tinged non foul smelling fluid was aspirated. The tube was connected to a drainage bag and  sutured in place. A dressing was placed. The patient tolerated the procedure well without immediate post  procedural complication. IMPRESSION: Successful CT guided placement of a 68 French all purpose drain catheter into the ventral wall fluid collection/abscess, likely a superinfected hematoma, with aspiration of 1.3 L of purulent fluid. Samples were sent to the laboratory as requested by the ordering clinical team. Electronically Signed   By: Sandi Mariscal M.D.   On: 10/29/2020 15:59    Anti-infectives: Anti-infectives (From admission, onward)   Start     Dose/Rate Route Frequency Ordered Stop   10/29/20 0300  piperacillin-tazobactam (ZOSYN) IVPB 3.375 g        3.375 g 12.5 mL/hr over 240 Minutes Intravenous Every 8 hours 10/28/20 2122     10/28/20 2030  piperacillin-tazobactam (ZOSYN) IVPB 3.375 g        3.375 g 100 mL/hr over 30 Minutes Intravenous  Once 10/28/20 2018 10/28/20 2100   10/28/20 2015  vancomycin (VANCOREADY) IVPB 1750 mg/350 mL        1,750 mg 175 mL/hr over 120 Minutes Intravenous  Once 10/28/20 2006 10/28/20 2333   10/28/20 2015  cefTRIAXone (ROCEPHIN) 2 g in sodium chloride 0.9 % 100 mL IVPB  Status:  Discontinued        2 g 200 mL/hr over 30 Minutes Intravenous  Once 10/28/20 2006 10/28/20 2018      Assessment/Plan:  S/p open ventral hernia repair Dr. Rosendo Gros 10/13/2020 Readmitted with abdominal wall hematoma that was presumed infected given fever and pain.  Diet as tolerated.  IV antibiotics- culture growing staph aureus.  Sensitivities not yet ready, culture reincubated.    Will do drain teaching.   Hold on d/c given staph culture.  Will want to make sure whether this is MRSA or not to d/c on appropriate antibiotics.    LOS: 3 days    Stark Klein 10/31/2020

## 2020-10-31 NOTE — Plan of Care (Signed)

## 2020-11-01 LAB — URINE CULTURE: Culture: 1000 — AB

## 2020-11-01 LAB — CREATININE, SERUM
Creatinine, Ser: 0.87 mg/dL (ref 0.61–1.24)
GFR, Estimated: >60 mL/min (ref >60)

## 2020-11-01 MED ORDER — SULFAMETHOXAZOLE-TRIMETHOPRIM 800-160 MG PO TABS: 1.0000 | ORAL_TABLET | Freq: Two times a day (BID) | ORAL | 0 refills | Status: AC

## 2020-11-01 MED ORDER — TRAMADOL HCL 50 MG PO TABS: 50.0000 mg | ORAL_TABLET | Freq: Four times a day (QID) | ORAL | 0 refills | Status: DC | PRN

## 2020-11-01 NOTE — Discharge Summary (Signed)
Physician Discharge Summary  Patient ID: Wesley [name] MRN: [id number] DOB/AGE: February 11, 1941 80 y.o.  Admit date: 10/28/2020 Discharge date: 11/01/2020  Admission Diagnoses:post op seroma/hematoma  Discharge Diagnoses:  Active Problems:   Abdominal wall abscess   Discharged Condition: good  Hospital Course: Pt was admitted approx 2 weeks post op with hematoma.  Pt had this drained by IR with drain placement.  Pt with some fevers at home, but didn't not recur while hospitilized.  He was abx while in the hosp.    Cxs showed Staph.    Pt otherwise had good pain control, was deemed stable for DC and Dc'd with abx for a total of 2 weeks  Consults: IR  Significant Diagnostic Studies: CT with large fluid collection  Treatments: antibiotics: Zosyn and IR drain placement  Discharge Exam: Blood pressure 129/86, pulse 64, temperature 98.5 F (36.9 C), resp. rate 16, height 5\' 9"  (1.753 m), weight 88.6 kg, SpO2 100 %. General appearance: alert and cooperative GI: soft, non-tender; bowel sounds normal; no masses,  no organomegaly and inc c/d/i, Jp SS  Disposition: Discharge disposition: 01-Home or Self Care       Discharge Instructions    Diet - low sodium heart healthy   Complete by: As directed    Increase activity slowly   Complete by: As directed      Allergies as of 11/01/2020      Reactions   Demerol Other (See Comments)   Causes dizziness   Promethazine Hcl Other (See Comments)   Dizziness      Medication List    STOP taking these medications   atorvastatin 80 MG tablet Commonly known as: LIPITOR   cholecalciferol 25 MCG (1000 UNIT) tablet Commonly known as: VITAMIN D3   Chromium Picolinate 1000 MCG Tabs   fexofenadine 180 MG tablet Commonly known as: ALLEGRA   finasteride 5 MG tablet Commonly known as: PROSCAR   Fish Oil 1200 MG Caps   fluticasone 50 MCG/ACT nasal spray Commonly known as: FLONASE   ibuprofen 200 MG tablet Commonly known as:  ADVIL   lisinopril 10 MG tablet Commonly known as: ZESTRIL   Magnesium 400 MG Tabs   Melatonin 5 MG Caps   Vitamin B-12 5000 MCG Tbdp     TAKE these medications   sulfamethoxazole-trimethoprim 800-160 MG tablet Commonly known as: BACTRIM DS Take 1 tablet by mouth 2 (two) times daily for 11 days.   traMADol 50 MG tablet Commonly known as: ULTRAM Take 1 tablet (50 mg total) by mouth every 6 (six) hours as needed (mild pain).       Follow-up Information    [name], MD. Go on 11/05/2020.   Specialty: General Surgery Why: Follow up appointment scheduled for 11:40 AM. Please arrive 15 min prior to appointment time for check in. Bring photo ID and insurance information.  Contact information: [place] [phone]               Signed: [name] 11/01/2020, 7:40 AM

## 2020-11-03 LAB — AEROBIC/ANAEROBIC CULTURE W GRAM STAIN (SURGICAL/DEEP WOUND): Special Requests: NORMAL

## 2020-11-03 LAB — CULTURE, BLOOD (ROUTINE X 2)
Culture: NO GROWTH
Culture: NO GROWTH
Special Requests: ADEQUATE
Special Requests: ADEQUATE

## 2020-11-05 ENCOUNTER — Other Ambulatory Visit: Payer: Self-pay | Admitting: Student

## 2020-11-05 ENCOUNTER — Other Ambulatory Visit: Payer: Self-pay | Admitting: General Surgery

## 2020-11-05 DIAGNOSIS — L02211 Cutaneous abscess of abdominal wall: Secondary | ICD-10-CM

## 2020-11-10 ENCOUNTER — Ambulatory Visit
Admission: RE | Admit: 2020-11-10 | Discharge: 2020-11-10 | Disposition: A | Discharge status: Home / Self Care | Payer: Medicare Other | Source: Ambulatory Visit | Attending: General Surgery | Admitting: General Surgery

## 2020-11-10 ENCOUNTER — Encounter: Payer: Self-pay | Admitting: *Deleted

## 2020-11-10 ENCOUNTER — Ambulatory Visit
Admission: RE | Admit: 2020-11-10 | Discharge: 2020-11-10 | Disposition: A | Discharge status: Home / Self Care | Payer: Medicare Other | Source: Ambulatory Visit | Attending: Student | Admitting: Student

## 2020-11-10 ENCOUNTER — Other Ambulatory Visit: Payer: Self-pay | Admitting: General Surgery

## 2020-11-10 DIAGNOSIS — L02211 Cutaneous abscess of abdominal wall: Secondary | ICD-10-CM

## 2020-11-10 DIAGNOSIS — I728 Aneurysm of other specified arteries: Secondary | ICD-10-CM | POA: Diagnosis not present

## 2020-11-10 DIAGNOSIS — N281 Cyst of kidney, acquired: Secondary | ICD-10-CM | POA: Diagnosis not present

## 2020-11-10 DIAGNOSIS — K8020 Calculus of gallbladder without cholecystitis without obstruction: Secondary | ICD-10-CM | POA: Diagnosis not present

## 2020-11-10 HISTORY — PX: IR RADIOLOGIST EVAL & MGMT: IMG5224

## 2020-11-10 IMAGING — CT CT ABD-PELV W/ CM
2 of 4 series · 15 of 46 positions shown, 17 images · IV contrast (iopamidol)
Comparison: [DATE], [DATE]

CLINICAL DATA: 80-year-old male with a history of abdominal wall
abscess. Drainage was performed [DATE]

EXAM:
CT ABDOMEN AND PELVIS WITH CONTRAST
TECHNIQUE: Multidetector CT imaging of the abdomen and pelvis was performed
using the standard protocol following bolus administration of
intravenous contrast.
CONTRAST:  100mL [H1] IOPAMIDOL ([H1]) INJECTION 61%

[Series 2: abd pelvis 5.00 br40 s3 axial · axial · 0.84mm/px · z∈[+1211,+1611]mm · 12 of 90 slices shown, 14 images]
[im 5/90  soft-tissue]
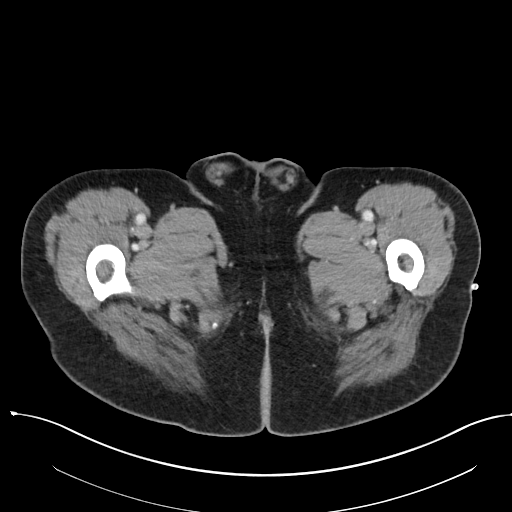
[im 5/90  bone]
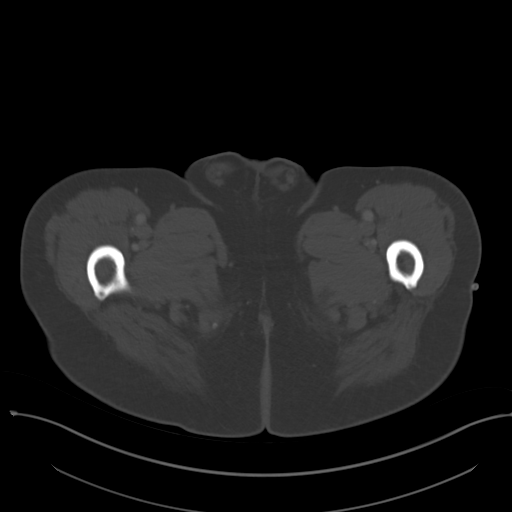
[im 14/90  soft-tissue]
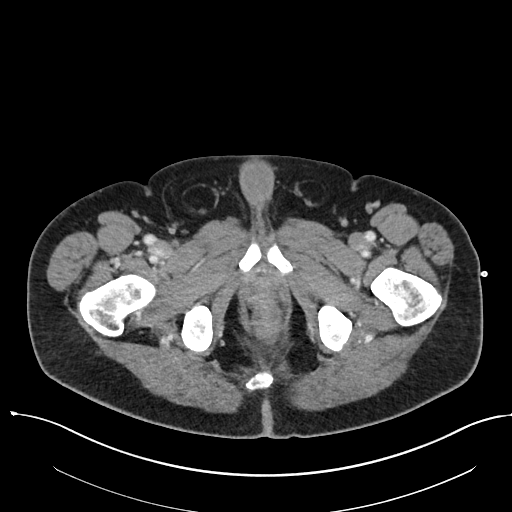
[im 18/90  soft-tissue]
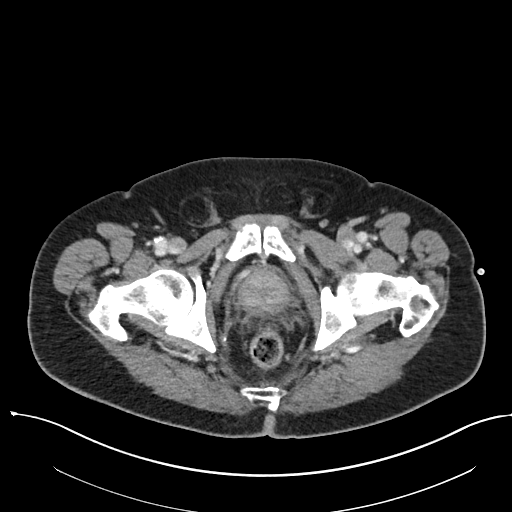
[im 27/90  soft-tissue]
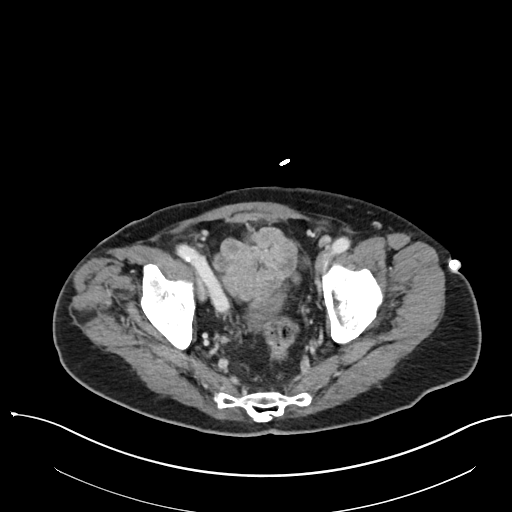
[im 36/90  soft-tissue]
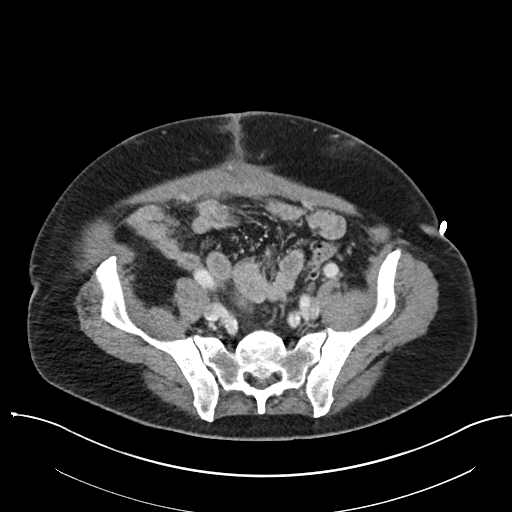
[im 41/90  soft-tissue]
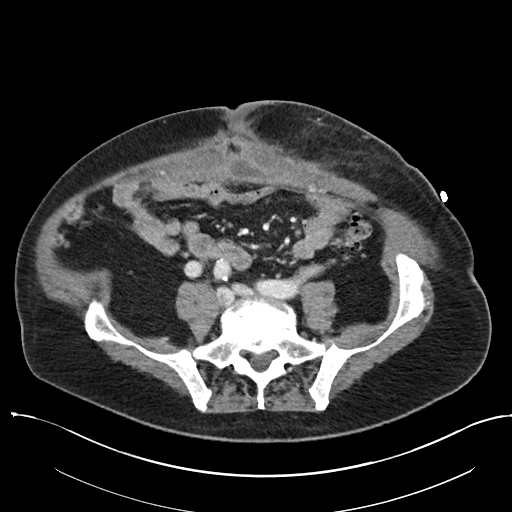
[im 49/90  soft-tissue]
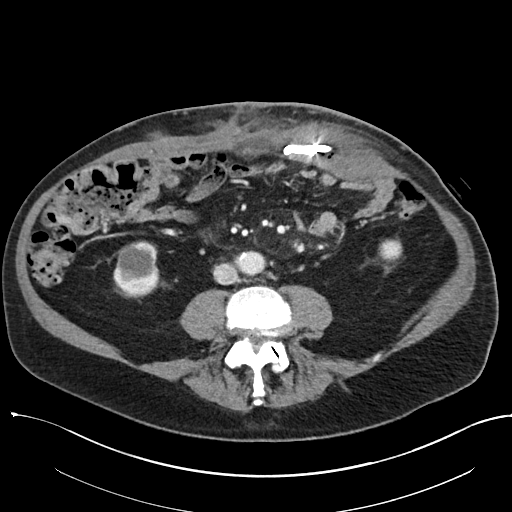
[im 54/90  soft-tissue]
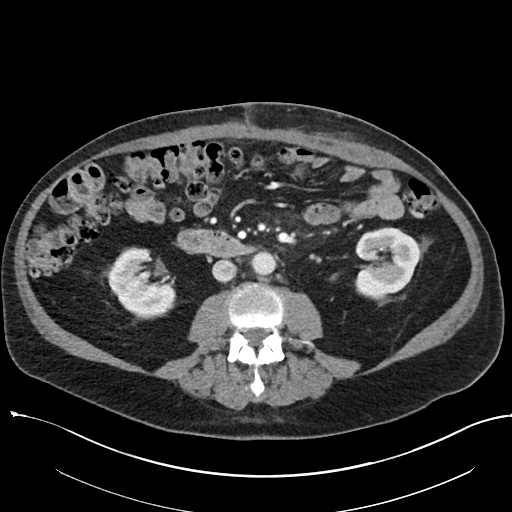
[im 63/90  soft-tissue]
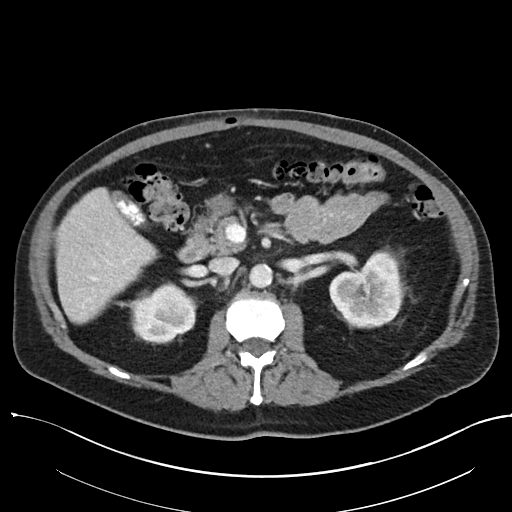
[im 63/90  bone]
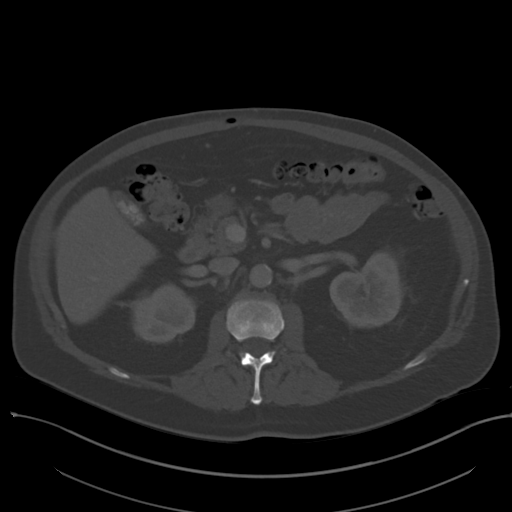
[im 72/90  soft-tissue]
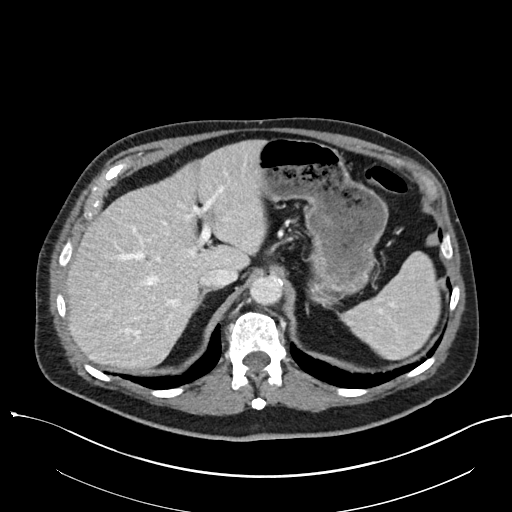
[im 76/90  soft-tissue]
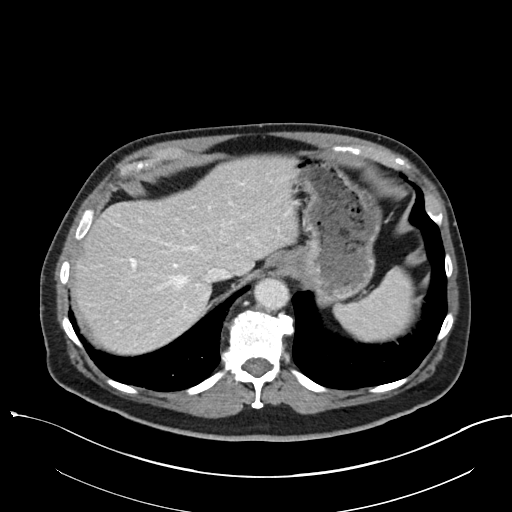
[im 85/90  soft-tissue]
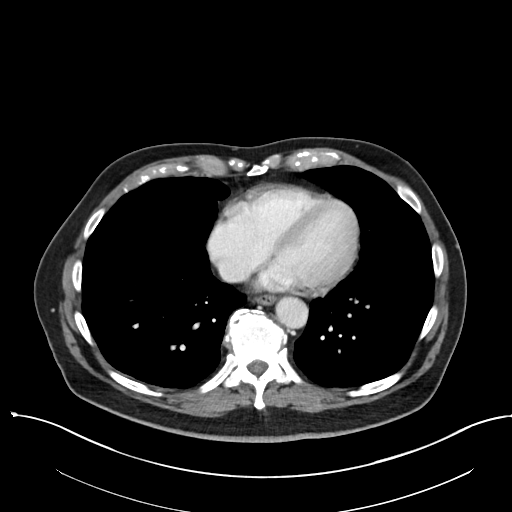

[Series 6: abd pelvis 2.00 br40 s3 cor · coronal · 0.84mm/px · 3 of 184 slices shown]
[im 62/184  soft-tissue]
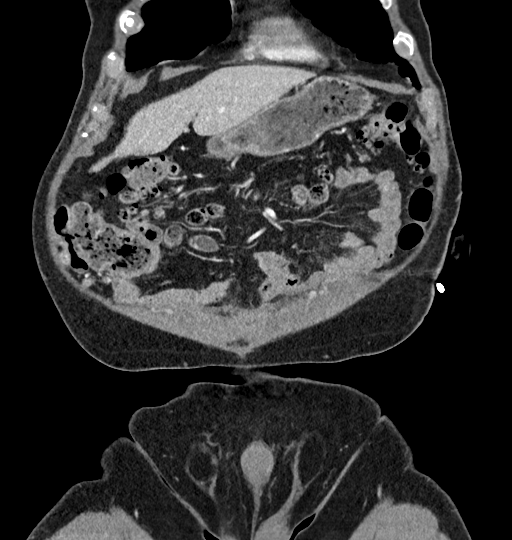
[im 82/184  soft-tissue]
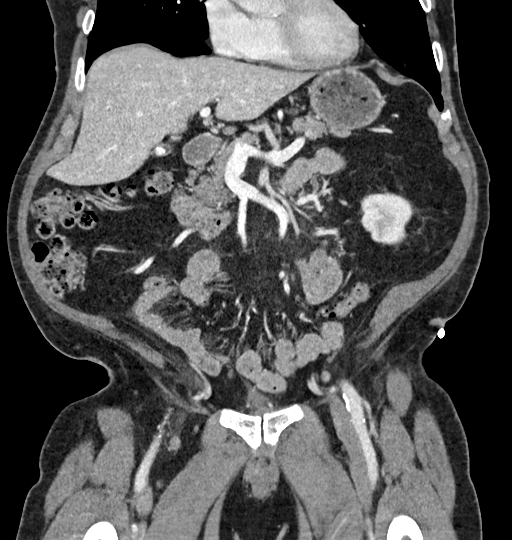
[im 102/184  soft-tissue]
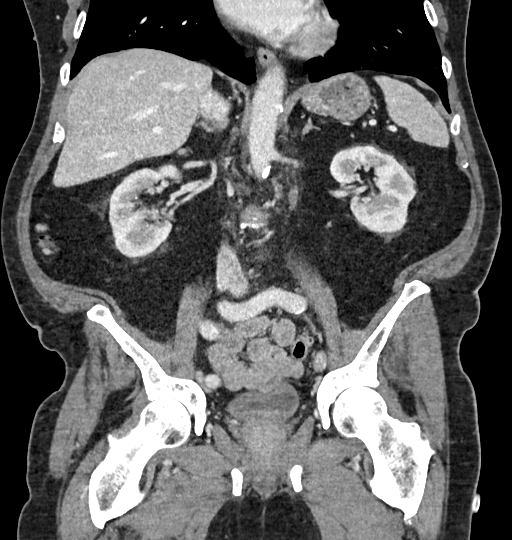

[15 of 46 positions shown; findings below may reference images not displayed]

FINDINGS: Lower chest: No acute finding of the lower chest.

Hepatobiliary: Unremarkable appearance of the liver. Similar
appearance of calcified cholelithiasis filling the gallbladder. No
local inflammatory changes.

Pancreas: Unremarkable

Spleen: Unremarkable

Adrenals/Urinary Tract:

- Right adrenal gland:  Unremarkable

- Left adrenal gland: Unremarkable.

- Right kidney: No hydronephrosis, nephrolithiasis, inflammation, or
ureteral dilation. Cyst within the lower pole of the right kidney,
3.4 cm unchanged.

- Left Kidney: No hydronephrosis, nephrolithiasis, inflammation, or
ureteral dilation. No focal lesion.

- Urinary Bladder: Urinary bladder decompressed

Stomach/Bowel:

- Stomach: Unremarkable.

- Small bowel: Unremarkable

- Appendix: Appendix is not visualized, however, no inflammatory
changes are present adjacent to the cecum to indicate an
appendicitis.

- Colon: Surgical changes of partial left colectomy again noted.
Mild stool burden. Colonic diverticula of the left colon with no
focal inflammatory changes. No wall thickening. No evidence of
obstruction.

Vascular/Lymphatic: Atherosclerotic changes of the abdominal aorta.
No aneurysm or dissection. Similar appearance of mild ectasia of the
celiac artery, potentially representing a poststenotic dilation.
Mesenteric arteries and renal arteries are patent. Bilateral iliac
and proximal femoral arteries patent.

No retroperitoneal or mesenteric adenopathy.

Reproductive: Unremarkable prostate.

Other: Pigtail drainage catheter terminates within the left
abdominal wall musculature. Compared to the prior CT there is
significant reduction in the degree of fluid associated with the
abdominal wall. There is a persisting a rounded fluid collection
between the musculature and the skin of the abdominal wall measuring
3.4 cm x 3.4 cm. Hounsfield units are low, potentially residual
fluid. Uncertain if this communicates with the previously placed
drain. There is minimal fluid tracking in a linear fashion within
the midline abdominal musculature measuring 9.2 cm on the sagittal
images and greatest thickness of 11 mm.

Musculoskeletal: No acute displaced fracture. Degenerative changes
of the thoracolumbar spine. Grade 1 anterolisthesis of L4 on L5 is
unchanged. No significant bony canal narrowing. Mild degenerative
changes of the hips.
IMPRESSION: The pigtail drainage catheter remains well position within the
anterior abdominal wall on the left, with near complete resolution
of the prior fluid collection. There is residual linear fluid within
the muscle in the midline abdomen as well as a small 3.4 cm rounded
collection just anterior to the drain, presumably residual seroma
and/or abscess.

Aortic Atherosclerosis ([H1]-[H1]).

Additional ancillary findings as above.

## 2020-11-10 MED ORDER — IOPAMIDOL (ISOVUE-300) INJECTION 61%
100.0000 mL | Freq: Once | INTRAVENOUS | Status: AC | PRN
  Administered 2020-11-10: 100 mL via INTRAVENOUS

## 2020-11-10 NOTE — Progress Notes (Signed)
Referring Physician(s): Dr. Rosendo Gros  Chief Complaint: The patient is seen in follow up today s/p abdominal wall absces  History of present illness: Wesley [name] is a 80 year old male with past medical history of CAD, cholelithiasis, CAD, GERD, HTN, paraesophageal hernia, and umbilical hernia who underwent mesh repair with Dr. Rosendo Gros 10/13/20.  He initially recovered well however 2 weeks post-op developed and fever and abdominal pain.  CT Abdomen Pelvis showed a large abscess in the anterior abdominal wall.  He underwent successful abdominal wall drain placement 10/29/20 by Dr. Pascal Lux with 1.2 liters of bloody fluid removed.  He was discharged home with drain in place and returns to IR drain clinic today for evaluation and management of his drain.   Wesley [name] presents today feeling well.  He states the drain is "annoying" but otherwise continues to drain 10+ mL blood-tinged fluid daily.  He reports he is not flushing the drain and it is connected to gravity drainage. Denies fever, chills, nausea, vomiting, belly pain. Continues Bactrim for few more days.   Past Medical History:  Diagnosis Date  . 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 CAD (coronary artery disease)   . Cholelithiasis   . Chronic allergic rhinitis   . Coronary atherosclerosis of native coronary artery   . Fatty liver   . GERD (gastroesophageal reflux disease)   . High blood pressure   . Hypercholesterolemia   . Macrocytosis without anemia   . OSA on CPAP   . Paraesophageal hernia   . Partial bowel obstruction ([place])   . 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 Sleep apnea   . Umbilical hernia without obstruction or gangrene     Past Surgical History:  Procedure Laterality Date  . BIOPSY  10/29/2019   Procedure: BIOPSY;  Surgeon: Jerene Bears, MD;  Location: [facility];  Service: Gastroenterology;;  . CARDIAC CATHETERIZATION  2000   Cath to rule out cardiac problems  RT HTN. PT denies significant findings  . CARDIAC CATHETERIZATION  2008  . COLOSTOMY N/A 10/31/2019   Procedure: COLOSTOMY;  Surgeon: Erroll Luna, MD;  Location: [facility];  Service: General;  Laterality: N/A;  . CYSTOSCOPY W/ URETERAL STENT PLACEMENT Bilateral 10/31/2019   Procedure: CYSTOSCOPY URETERAL STENT PLACEMENT BILATERAL;  Surgeon: Franchot Gallo, MD;  Location: [facility];  Service: Urology;  Laterality: Bilateral;  . CYSTOSCOPY WITH STENT PLACEMENT Bilateral 03/11/2020   Procedure: CYSTOSCOPY WITH BILATERAL FIREFLY INJECTION;  Surgeon: Irine Seal, MD;  Location: [facility];  Service: Urology;  Laterality: Bilateral;  . EYE SURGERY     BILATERAL CATARACT SURGERY WITH LENS IMPLANTS  . FLEXIBLE SIGMOIDOSCOPY N/A 10/29/2019   Procedure: FLEXIBLE SIGMOIDOSCOPY;  Surgeon: Jerene Bears, MD;  Location: [facility];  Service: Gastroenterology;  Laterality: N/A;  . INCISIONAL HERNIA REPAIR N/A 10/13/2020   Procedure: OPEN INCISIONAL HERNIA REPAIR WITH MESH;  Surgeon: [name], MD;  Location: [facility];  Service: General;  Laterality: N/A;  . INSERTION OF MESH N/A 09/08/2016   Procedure: INSERTION OF MESH;  Surgeon: Jackolyn Confer, MD;  Location: [facility];  Service: General;  Laterality: N/A;  . LAPAROSCOPIC SIGMOID COLECTOMY N/A 10/31/2019   Procedure: DIAGNOSTIC LAPAROSCOPY; EXPLORATORY LAPAROTOMY; SIGMOID COLECTOMY;  Surgeon: Erroll Luna, MD;  Location: [facility];  Service: General;  Laterality: N/A;  . right rotator cuff    . ROTATOR CUFF REPAIR    . SUBMUCOSAL TATTOO INJECTION  10/29/2019   Procedure: SUBMUCOSAL TATTOO  INJECTION;  Surgeon: Jerene Bears, MD;  Location: [facility];  Service: Gastroenterology;;  . TONSILLECTOMY    . UMBILICAL HERNIA REPAIR N/A 09/08/2016   Procedure: OPEN UMBILICAL HERNIA REPAIR WITH MESH;  Surgeon: Jackolyn Confer, MD;  Location: [facility];  Service: General;  Laterality: N/A;  . XI ROBOTIC ASSISTED COLOSTOMY TAKEDOWN N/A 03/11/2020   Procedure: ROBOTIC  ASSISTED COLOSTOMY REVERSAL, RIGID PROCTOSCOPY;  Surgeon: Leighton Ruff, MD;  Location: [facility];  Service: General;  Laterality: N/A;    Allergies: Demerol and Promethazine hcl  Medications: Prior to Admission medications   Medication Sig Start Date End Date Taking? Authorizing Provider  sulfamethoxazole-trimethoprim (BACTRIM DS) 800-160 MG tablet Take 1 tablet by mouth 2 (two) times daily for 11 days. 11/01/20 11/12/20  [name], MD  traMADol (ULTRAM) 50 MG tablet Take 1 tablet (50 mg total) by mouth every 6 (six) hours as needed (mild pain). 11/01/20   [name], MD     Family History  Problem Relation Age of Onset  . Diabetes Mother   . CAD Father   . Melanoma Brother 57  . Healthy Daughter   . Healthy Son   . Colon cancer Neg Hx   . Esophageal cancer Neg Hx   . Rectal cancer Neg Hx   . Stomach cancer Neg Hx     Social History   Socioeconomic History  . Marital status: Married    Spouse name: Not on file  . Number of children: Not on file  . Years of education: Not on file  . Highest education level: Not on file  Occupational History  . Not on file  Tobacco Use  . Smoking status: Never Smoker  . Smokeless tobacco: Never Used  Vaping Use  . Vaping Use: Never used  Substance and Sexual Activity  . Alcohol use: Yes    Alcohol/week: 14.0 standard drinks    Types: 14 drink(s) per week    Comment: or more  . Drug use: No  . Sexual activity: Not on file  Other Topics Concern  . Not on file  Social History Narrative  . Not on file   Social Determinants of Health   Financial Resource Strain: Not on file  Food Insecurity: Not on file  Transportation Needs: Not on file  Physical Activity: Not on file  Stress: Not on file  Social Connections: Not on file     Vital Signs: There were no vitals taken for this visit.  Physical Exam  NAD, alert Abdomen: soft, non-tender.  Left lower abdominal wall drain in place.  Insertion site intact, clean, and  dry.  Sero-sanguinous fluid in tubing.   Imaging: No results found.  Labs:  CBC: Recent Labs    10/06/20 1400 10/14/20 0451 10/28/20 1835 10/29/20 0316  WBC 6.0 14.2* 7.7 7.6  HGB 13.9 13.5 11.7* 12.9*  HCT 42.6 41.2 35.7* 40.0  PLT 191 179 333 373    COAGS: Recent Labs    10/28/20 1835  INR 1.2  APTT 30    BMP: Recent Labs    03/01/20 1328 03/12/20 0511 03/13/20 0543 03/14/20 0553 07/05/20 1529 10/06/20 1400 10/28/20 1835 10/29/20 0316 11/01/20 0240  NA 139 138 136 133* 134* 135 132* 137  --   K 5.4* 5.1 4.2 4.1 4.0 4.4 4.3 4.9  --   CL 105 104 105 102 101 102 99 102  --   CO2 27 24 25 24 25 25 25 24   --   GLUCOSE 99 127*  96 98 100* 113* 119* 124*  --   BUN 17 9 14 16 19 15 23 20   --   CALCIUM 9.5 8.7* 8.7* 8.6* 9.3 9.1 8.8* 9.4  --   CREATININE 1.07 0.84 0.86 0.72 0.86 0.95 0.94 1.03 0.87  GFRNONAA >60 >60 >60 >60  --  >60 >60 >60 >60  GFRAA >60 >60 >60 >60  --   --   --   --   --     LIVER FUNCTION TESTS: Recent Labs    10/06/20 1400 10/28/20 1835 10/29/20 0316  BILITOT 1.1 0.7 1.2  AST 30 50* 57*  ALT 35 43 46*  ALKPHOS 81 69 76  PROT 9.1* 8.5* 9.5*  ALBUMIN 3.5 2.6* 2.8*    Assessment: Abdominal wall abscess s/p aspiration and drainage by Dr. Pascal Lux 10/29/20 Wesley [name] presents to IR drain clinic today with his left lower abdominal wall drain in place.  He continues to have 10+ mL output daily, but reports daily output appears to be slowing down.  He is not flushing the drain.  Wife assisting with drain care as needed at home.  CT Abdomen Pelvis reviewed by Dr. Earleen Newport who notes ongoing fluid collection. At this time, it is difficult to determine whether there are small, potentially loculated collections, but his drain continues to have daily output with significant improvement in the overall size of the collection.  Continue drain and current level of care at this time. Plan to repeat CT in 2 weeks.  Discussed with patient and wife who  verbalize understanding. Schedulers will call to arrange follow-up.     Signed: Docia Barrier, PA 11/10/2020, 1:02 PM   Please refer to Dr. Earleen Newport attestation of this note for management and plan.

## 2020-11-12 DIAGNOSIS — L02211 Cutaneous abscess of abdominal wall: Secondary | ICD-10-CM | POA: Diagnosis not present

## 2020-11-12 DIAGNOSIS — K432 Incisional hernia without obstruction or gangrene: Secondary | ICD-10-CM | POA: Diagnosis not present

## 2020-11-25 ENCOUNTER — Encounter: Payer: Self-pay | Admitting: *Deleted

## 2020-11-25 ENCOUNTER — Ambulatory Visit
Admission: RE | Admit: 2020-11-25 | Discharge: 2020-11-25 | Disposition: A | Discharge status: Home / Self Care | Payer: Medicare Other | Source: Ambulatory Visit | Attending: General Surgery | Admitting: General Surgery

## 2020-11-25 DIAGNOSIS — K8020 Calculus of gallbladder without cholecystitis without obstruction: Secondary | ICD-10-CM | POA: Diagnosis not present

## 2020-11-25 DIAGNOSIS — K651 Peritoneal abscess: Secondary | ICD-10-CM | POA: Diagnosis not present

## 2020-11-25 DIAGNOSIS — K8080 Other cholelithiasis without obstruction: Secondary | ICD-10-CM | POA: Diagnosis not present

## 2020-11-25 DIAGNOSIS — M160 Bilateral primary osteoarthritis of hip: Secondary | ICD-10-CM | POA: Diagnosis not present

## 2020-11-25 DIAGNOSIS — L02211 Cutaneous abscess of abdominal wall: Secondary | ICD-10-CM

## 2020-11-25 DIAGNOSIS — N281 Cyst of kidney, acquired: Secondary | ICD-10-CM | POA: Diagnosis not present

## 2020-11-25 HISTORY — PX: IR RADIOLOGIST EVAL & MGMT: IMG5224

## 2020-11-25 IMAGING — CT CT ABD-PELV W/ CM
2 of 4 series · 11 of 46 positions shown, 12 images · IV contrast (iopamidol)
Comparison: [DATE], [DATE], [DATE], [DATE]

CLINICAL DATA: 80-year-old male with a history abscess

EXAM:
CT ABDOMEN AND PELVIS WITH CONTRAST
TECHNIQUE: Multidetector CT imaging of the abdomen and pelvis was performed
using the standard protocol following bolus administration of
intravenous contrast.
CONTRAST:  100mL [UX] IOPAMIDOL ([UX]) INJECTION 61%

[Series 2: abd pelvis 5.00 br40 s3 axial · axial · 0.62mm/px · z∈[+1392,+1767]mm · 8 of 91 slices shown, 9 images]
[im 8/91  soft-tissue]
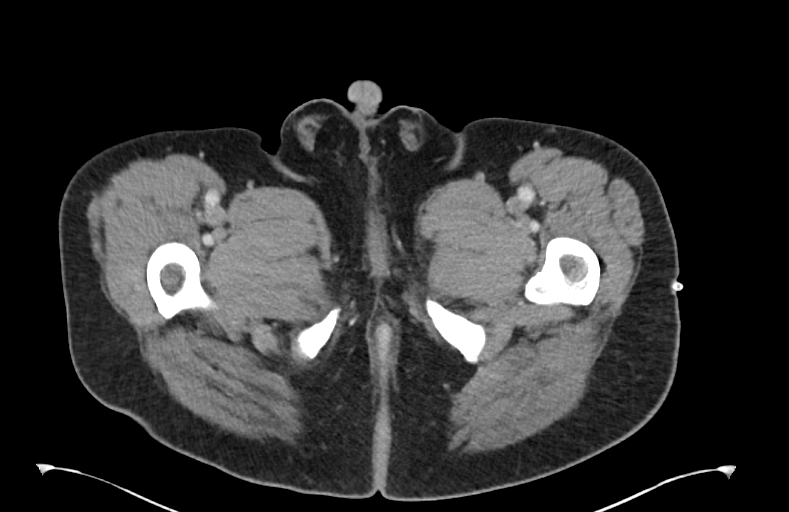
[im 8/91  bone]
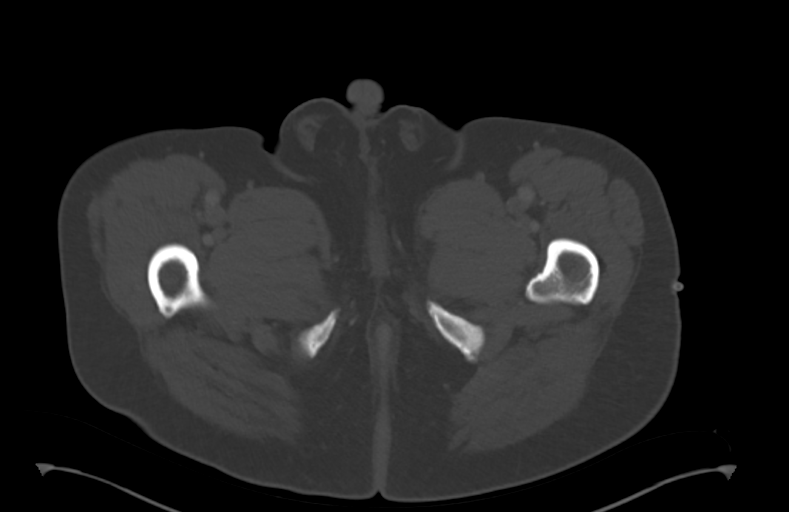
[im 20/91  soft-tissue]
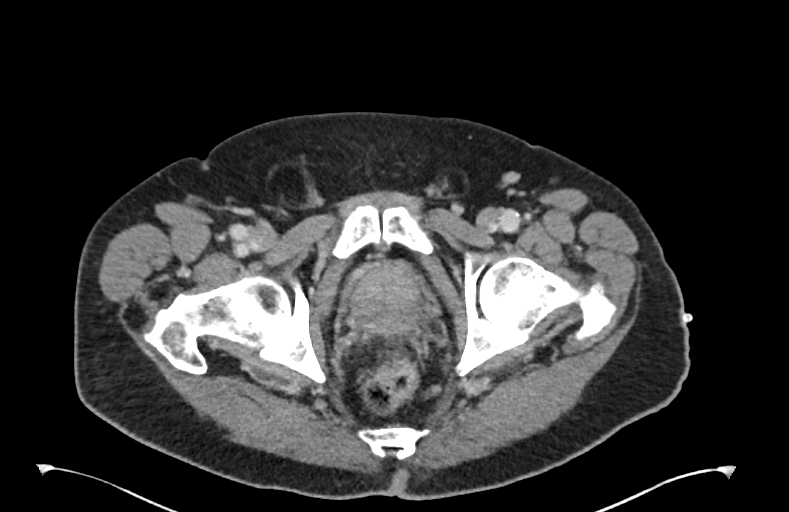
[im 28/91  soft-tissue]
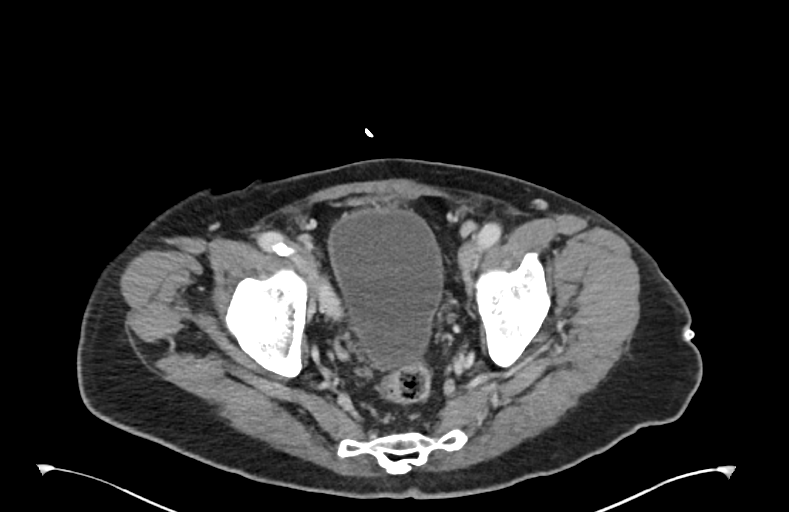
[im 40/91  soft-tissue]
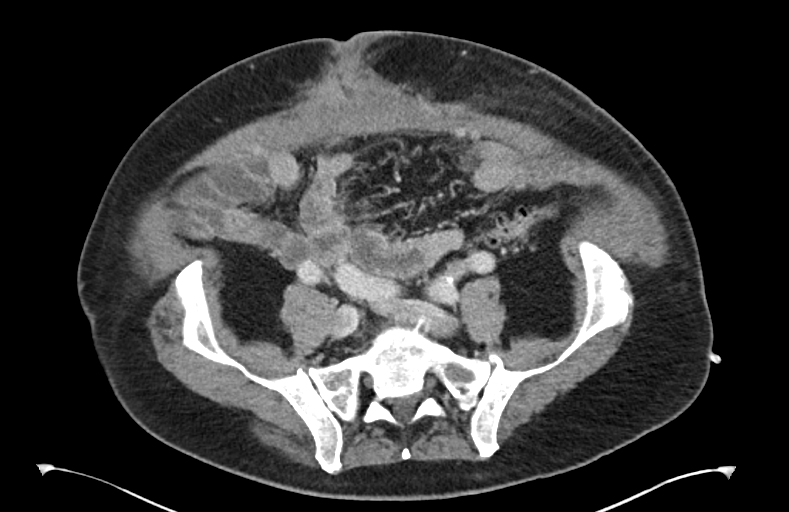
[im 51/91  soft-tissue]
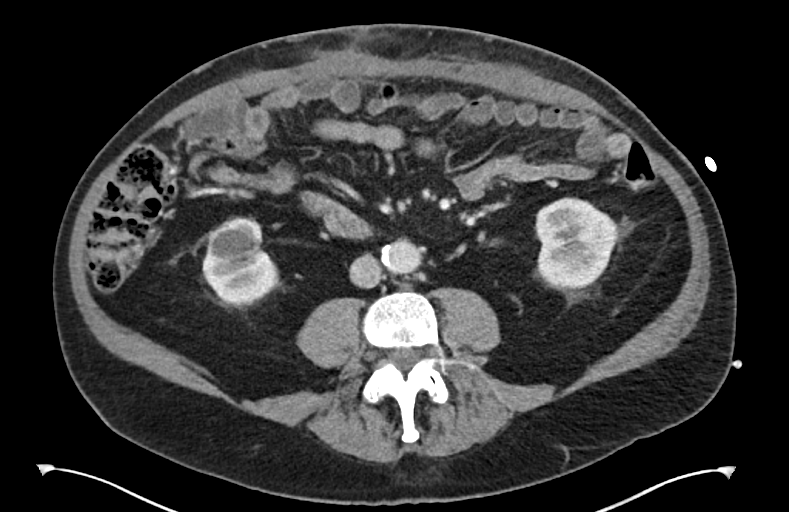
[im 63/91  soft-tissue]
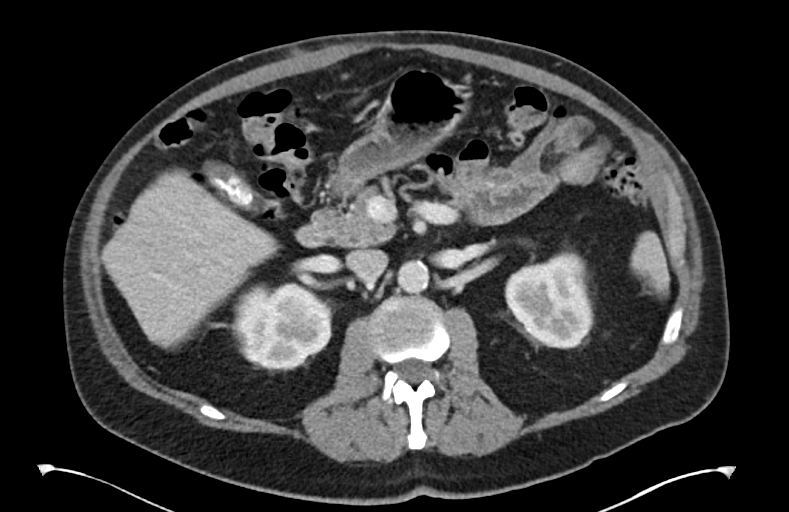
[im 71/91  soft-tissue]
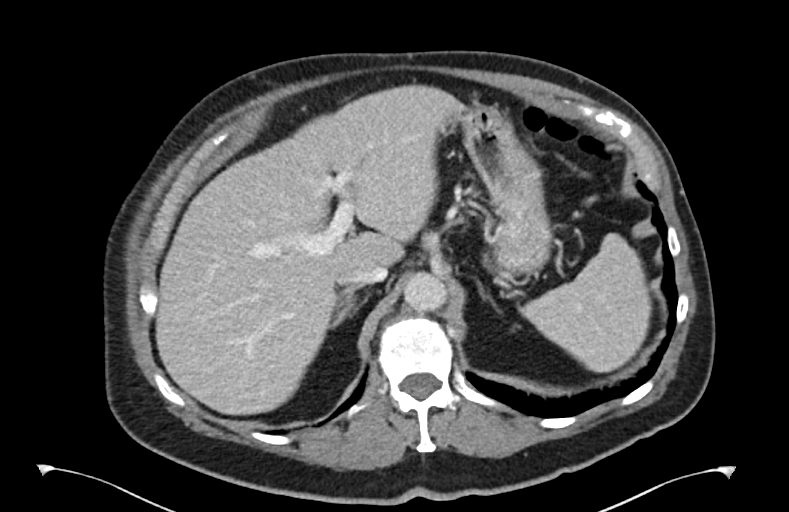
[im 83/91  soft-tissue]
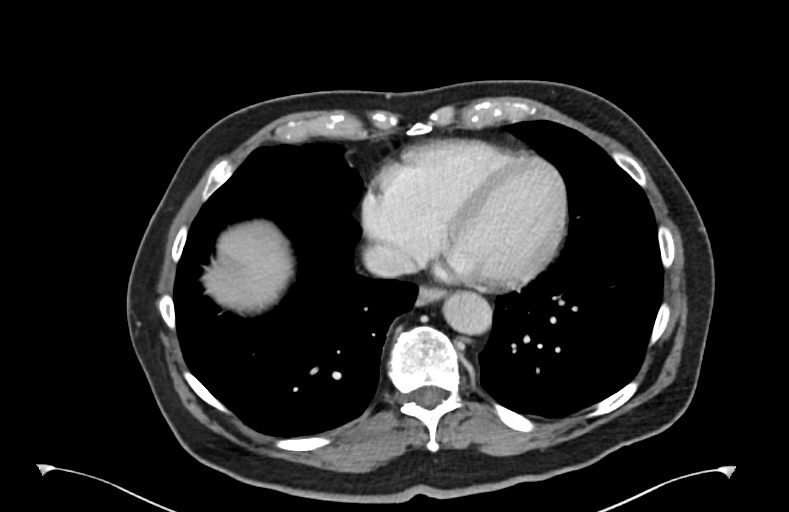

[Series 6: abd pelvis 2.00 br40 s3 cor · coronal · 0.86mm/px · 3 of 152 slices shown]
[im 51/152  soft-tissue]
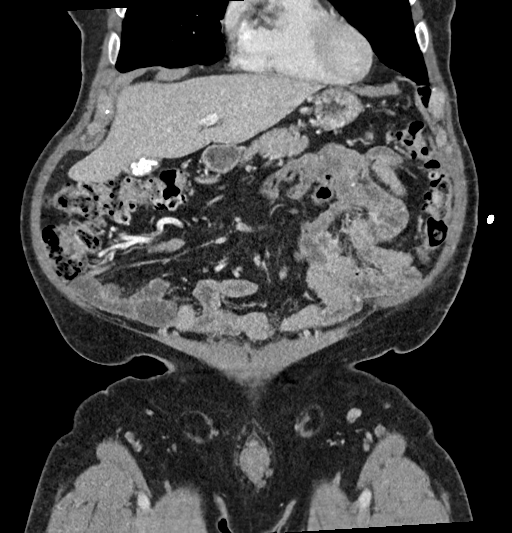
[im 68/152  soft-tissue]
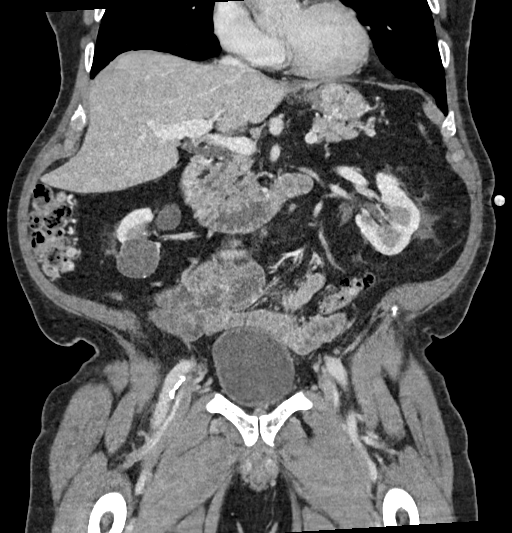
[im 84/152  soft-tissue]
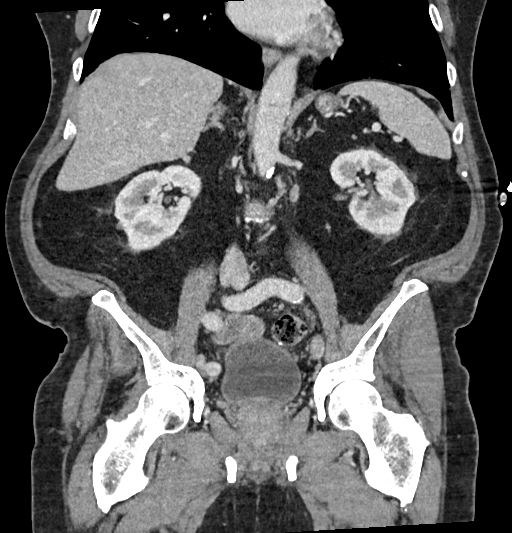

[11 of 46 positions shown; findings below may reference images not displayed]

FINDINGS: Lower chest: No acute finding of lower chest

Hepatobiliary: Unremarkable liver. Calcified cholelithiasis. No
inflammatory changes.

Pancreas: Unremarkable

Spleen: Unremarkable

Adrenals/Urinary Tract:

- Right adrenal gland:  Unremarkable

- Left adrenal gland: Unremarkable.

- Right kidney: No hydronephrosis, nephrolithiasis, inflammation, or
ureteral dilation. Low-density fluid cyst within the inferior right
kidney, compatible with simple cyst.

- Left Kidney: No hydronephrosis, nephrolithiasis, inflammation, or
ureteral dilation. No focal lesion.

- Urinary Bladder: Urinary bladder partially distended,
unremarkable.

Stomach/Bowel:

- Stomach: Unremarkable.

- Small bowel: Unremarkable

- Appendix: Normal appendix visualized right lower quadrant

- Colon: Diverticular disease throughout the colon. No focal
inflammatory changes. Surgical changes of partial left colectomy.
Mild stool burden. No wall thickening. No evidence of obstruction.

Vascular/Lymphatic: Atherosclerotic changes of the abdominal aorta.
No aneurysm. No dissection flap. No periaortic fluid or inflammatory
changes.

Similar appearance of ectasias of the celiac artery beyond the
origin, potentially post a not dilatation, unchanged.

Mesenteric arteries and renal arteries are patent. Bilateral iliac
arteries and proximal femoral arteries are patent.

No adenopathy

Reproductive: Unremarkable prostate

Other: Pigtail drainage catheter within the abdominal wall of the
left abdomen unchanged in position. Improved appearance of the
inflammatory changes with resolution of the focal fluid within the
abdominal wall. There is a 9 mm hypodense/hypoenhancing focus in the
midline region, separate from the drain, potentially postoperative
changes versus residual fluid. No new fluid collection or increasing
fluid collection.

Musculoskeletal: No acute displaced fracture. Degenerative changes
again noted of the visualized thoracolumbar spine. Similar
appearance of grade 1 anterolisthesis of L4 on L5. Degenerative
changes of the hips.
IMPRESSION: Unchanged position of pigtail drainage catheter within the abdominal
wall of the left lower abdomen. While there are mild inflammatory
changes persisting, there is no evidence of persistent abscess or
new abscess.

Aortic Atherosclerosis ([UX]-[UX]).

Additional ancillary findings as above.

## 2020-11-25 MED ORDER — IOPAMIDOL (ISOVUE-300) INJECTION 61%
100.0000 mL | Freq: Once | INTRAVENOUS | Status: AC | PRN
  Administered 2020-11-25: 100 mL via INTRAVENOUS

## 2020-11-25 NOTE — Progress Notes (Signed)
Referring Physician(s): Ramirez,Armando  Chief Complaint: The patient is seen in follow up today s/p abdominal wall abscess   History of present illness: 80 year old male with past medical history of CAD, cholelithiasis, CAD, GERD, HTN, paraesophageal hernia, and umbilical hernia who underwent mesh repair with Dr. Rosendo Gros 10/13/20.  Patient developed fever and abdominal pain 2 weeks after the surgery, was found to have a large abscess in the anterior abdominal wall. He was admitted and underwent aspiration and drain placement with Dr. Pascal Lux on 10/29/20. Patient was eventually discharged home with the drain in place, had follow up CT at the clinic on 11/10/20 which showed residual seroma/and or abscess. Patient was advised to keep the drain in place and have another follow up CT in weeks to reevaluate the seroma and/or abscess.  Patient presents ti the clinic today for follow up.   Patient states that he is doing great.  No complaints today, denies fever, chills, abdominal pain, nausea, vomiting, diarrhea, constipation.  He is not flushing the drain and reports minimal to no output.  Patient finished abx, and had a follow up visit with Dr. Rosendo Gros last week.   Past Medical History:  Diagnosis Date  . 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 CAD (coronary artery disease)   . Cholelithiasis   . Chronic allergic rhinitis   . Coronary atherosclerosis of native coronary artery   . Fatty liver   . GERD (gastroesophageal reflux disease)   . High blood pressure   . Hypercholesterolemia   . Macrocytosis without anemia   . OSA on CPAP   . Paraesophageal hernia   . Partial bowel obstruction ([place])   . 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 Sleep apnea   . Umbilical hernia without obstruction or gangrene     Past Surgical History:  Procedure Laterality Date  . BIOPSY  10/29/2019   Procedure: BIOPSY;  Surgeon: Jerene Bears, MD;  Location: [facility];  Service: Gastroenterology;;  . CARDIAC CATHETERIZATION  2000   Cath to rule out cardiac problems RT HTN. PT denies significant findings  . CARDIAC CATHETERIZATION  2008  . COLOSTOMY N/A 10/31/2019   Procedure: COLOSTOMY;  Surgeon: Erroll Luna, MD;  [facility];  Service: General;  Laterality: N/A;  . CYSTOSCOPY W/ URETERAL STENT PLACEMENT Bilateral 10/31/2019   Procedure: CYSTOSCOPY URETERAL STENT PLACEMENT BILATERAL;  Surgeon: Franchot Gallo, MD;  [facility];  Service: Urology;  Laterality: Bilateral;  . CYSTOSCOPY WITH STENT PLACEMENT Bilateral 03/11/2020   Procedure: CYSTOSCOPY WITH BILATERAL FIREFLY INJECTION;  Surgeon: Irine Seal, MD;  [facility];  Service: Urology;  Laterality: Bilateral;  . EYE SURGERY     BILATERAL CATARACT SURGERY WITH LENS IMPLANTS  . FLEXIBLE SIGMOIDOSCOPY N/A 10/29/2019   Procedure: FLEXIBLE SIGMOIDOSCOPY;  Surgeon: Jerene Bears, MD;  Location: [facility];  Service: Gastroenterology;  Laterality: N/A;  . INCISIONAL HERNIA REPAIR N/A 10/13/2020   Procedure: OPEN INCISIONAL HERNIA REPAIR WITH MESH;  Surgeon: [name], MD;  Location: [place];  Service: General;  Laterality: N/A;  . INSERTION OF MESH N/A 09/08/2016   Procedure: INSERTION OF MESH;  Surgeon: Jackolyn Confer, MD;  [facility];  Service: General;  Laterality: N/A;  . IR RADIOLOGIST EVAL & MGMT  11/10/2020  . IR RADIOLOGIST EVAL & MGMT  11/25/2020  . LAPAROSCOPIC SIGMOID COLECTOMY N/A 10/31/2019   Procedure: DIAGNOSTIC LAPAROSCOPY; EXPLORATORY LAPAROTOMY; SIGMOID COLECTOMY;  Surgeon: Erroll Luna, MD;  [facility];  Service: General;  Laterality: N/A;  . right rotator cuff    . ROTATOR CUFF REPAIR    . SUBMUCOSAL TATTOO INJECTION  10/29/2019   Procedure: SUBMUCOSAL TATTOO INJECTION;  Surgeon: Jerene Bears, MD;  [facility];  Service: Gastroenterology;;  . TONSILLECTOMY    . UMBILICAL HERNIA REPAIR N/A 09/08/2016   Procedure: OPEN UMBILICAL  HERNIA REPAIR WITH MESH;  Surgeon: Jackolyn Confer, MD;  [facility];  Service: General;  Laterality: N/A;  . XI ROBOTIC ASSISTED COLOSTOMY TAKEDOWN N/A 03/11/2020   Procedure: ROBOTIC ASSISTED COLOSTOMY REVERSAL, RIGID PROCTOSCOPY;  Surgeon: Leighton Ruff, MD;  [facility];  Service: General;  Laterality: N/A;    Allergies: Demerol and Promethazine hcl  Medications: Prior to Admission medications   Medication Sig Start Date End Date Taking? Authorizing Provider  traMADol (ULTRAM) 50 MG tablet Take 1 tablet (50 mg total) by mouth every 6 (six) hours as needed (mild pain). 11/01/20   [name], MD     Family History  Problem Relation Age of Onset  . Diabetes Mother   . CAD Father   . Melanoma Brother 56  . Healthy Daughter   . Healthy Son   . Colon cancer Neg Hx   . Esophageal cancer Neg Hx   . Rectal cancer Neg Hx   . Stomach cancer Neg Hx     Social History   Socioeconomic History  . Marital status: Married    Spouse name: Not on file  . Number of children: Not on file  . Years of education: Not on file  . Highest education level: Not on file  Occupational History  . Not on file  Tobacco Use  . Smoking status: Never Smoker  . Smokeless tobacco: Never Used  Vaping Use  . Vaping Use: Never used  Substance and Sexual Activity  . Alcohol use: Yes    Alcohol/week: 14.0 standard drinks    Types: 14 drink(s) per week    Comment: or more  . Drug use: No  . Sexual activity: Not on file  Other Topics Concern  . Not on file  Social History Narrative  . Not on file   Social Determinants of Health   Financial Resource Strain: Not on file  Food Insecurity: Not on file  Transportation Needs: Not on file  Physical Activity: Not on file  Stress: Not on file  Social Connections: Not on file     Vital Signs: There were no vitals taken for this visit.  Physical Exam Constitutional:      General: He is not in acute distress.    Appearance: Normal  appearance. He is not ill-appearing.  Pulmonary:     Effort: Pulmonary effort is normal.  Abdominal:     Palpations: Abdomen is soft.     Comments: Positive LLQ drain to a gravity bag. Site is unremarkable with no erythema, edema, tenderness, bleeding or drainage. Suture in place. Dressing is clean, dry, and intact. No fluid noted in the gravity bag.   Skin:    General: Skin is warm and dry.  Neurological:     Mental Status: He is alert and oriented to person, place, and time.  Psychiatric:        Mood and Affect: Mood normal.        Behavior: Behavior normal.        Judgment: Judgment normal.     Imaging: IR Radiologist Eval & Mgmt  Result Date: 11/25/2020 Please refer  to notes tab for details about interventional procedure. (Op Note)   Labs:  CBC: Recent Labs    10/06/20 1400 10/14/20 0451 10/28/20 1835 10/29/20 0316  WBC 6.0 14.2* 7.7 7.6  HGB 13.9 13.5 11.7* 12.9*  HCT 42.6 41.2 35.7* 40.0  PLT 191 179 333 373    COAGS: Recent Labs    10/28/20 1835  INR 1.2  APTT 30    BMP: Recent Labs    03/01/20 1328 03/12/20 0511 03/13/20 0543 03/14/20 0553 07/05/20 1529 10/06/20 1400 10/28/20 1835 10/29/20 0316 11/01/20 0240  NA 139 138 136 133* 134* 135 132* 137  --   K 5.4* 5.1 4.2 4.1 4.0 4.4 4.3 4.9  --   CL 105 104 105 102 101 102 99 102  --   CO2 27 24 25 24 25 25 25 24   --   GLUCOSE 99 127* 96 98 100* 113* 119* 124*  --   BUN 17 9 14 16 19 15 23 20   --   CALCIUM 9.5 8.7* 8.7* 8.6* 9.3 9.1 8.8* 9.4  --   CREATININE 1.07 0.84 0.86 0.72 0.86 0.95 0.94 1.03 0.87  GFRNONAA >60 >60 >60 >60  --  >60 >60 >60 >60  GFRAA >60 >60 >60 >60  --   --   --   --   --     LIVER FUNCTION TESTS: Recent Labs    10/06/20 1400 10/28/20 1835 10/29/20 0316  BILITOT 1.1 0.7 1.2  AST 30 50* 57*  ALT 35 43 46*  ALKPHOS 81 69 76  PROT 9.1* 8.5* 9.5*  ALBUMIN 3.5 2.6* 2.8*    Assessment:  S/p abdominal wall abscess aspiration and drain placement with Dr. Pascal Lux  on 10/29/20.   Patient presents to the clinic for the second follow up with CT and possible contrast drain injection to r/o fistula formation.  Follow up CT was reviewed by Dr. Earleen Newport, who confirmed complete resolution of the abdominal wall seroma and/or abscess. Contrast drain injection is not indicated as the seroma/abscess was never in the peritoneal cavity therefore no chance of fistula formation with GI track.  Drain can be removed safely per Dr. Earleen Newport.  Informed patient and he is agreeable to proceed with the drain removal.   Drain was removed intact, patient tolerated the procedure well.  A dressing was placed.  Advised patient to keep the dressing clean and dry for 24 hours, keep the site clean and dry as much as possible till the site heals completely.  No submerging (bathing or swimming) for next 7 days.   Patient to follow up with Dr. Rosendo Gros.  No follow up with IR is needed.  Please contact the clinic if there is any questions or concern regarding the drain removal.   Signed: Tera Mater, PA-C 11/25/2020, 2:21 PM   Please refer to Dr. Aleen Campi attestation of this note for management and plan.

## 2020-12-13 DIAGNOSIS — Z23 Encounter for immunization: Secondary | ICD-10-CM | POA: Diagnosis not present

## 2021-01-06 ENCOUNTER — Encounter: Payer: Self-pay | Admitting: Family Medicine

## 2021-01-06 ENCOUNTER — Ambulatory Visit (INDEPENDENT_AMBULATORY_CARE_PROVIDER_SITE_OTHER): Payer: Medicare Other | Admitting: Family Medicine

## 2021-01-06 ENCOUNTER — Other Ambulatory Visit: Payer: Self-pay

## 2021-01-06 VITALS — BP 130/78 | HR 63 | Temp 98.6°F | Ht 67.0 in | Wt 190.8 lb

## 2021-01-06 DIAGNOSIS — I2510 Atherosclerotic heart disease of native coronary artery without angina pectoris: Secondary | ICD-10-CM

## 2021-01-06 DIAGNOSIS — J309 Allergic rhinitis, unspecified: Secondary | ICD-10-CM | POA: Insufficient documentation

## 2021-01-06 DIAGNOSIS — J301 Allergic rhinitis due to pollen: Secondary | ICD-10-CM

## 2021-01-06 DIAGNOSIS — Z9889 Other specified postprocedural states: Secondary | ICD-10-CM

## 2021-01-06 DIAGNOSIS — Z1159 Encounter for screening for other viral diseases: Secondary | ICD-10-CM | POA: Diagnosis not present

## 2021-01-06 DIAGNOSIS — N401 Enlarged prostate with lower urinary tract symptoms: Secondary | ICD-10-CM | POA: Diagnosis not present

## 2021-01-06 DIAGNOSIS — R351 Nocturia: Secondary | ICD-10-CM | POA: Diagnosis not present

## 2021-01-06 DIAGNOSIS — F325 Major depressive disorder, single episode, in full remission: Secondary | ICD-10-CM | POA: Diagnosis not present

## 2021-01-06 DIAGNOSIS — E538 Deficiency of other specified B group vitamins: Secondary | ICD-10-CM

## 2021-01-06 DIAGNOSIS — E559 Vitamin D deficiency, unspecified: Secondary | ICD-10-CM

## 2021-01-06 DIAGNOSIS — I700 Atherosclerosis of aorta: Secondary | ICD-10-CM

## 2021-01-06 DIAGNOSIS — Z8719 Personal history of other diseases of the digestive system: Secondary | ICD-10-CM

## 2021-01-06 DIAGNOSIS — L02211 Cutaneous abscess of abdominal wall: Secondary | ICD-10-CM

## 2021-01-06 NOTE — Patient Instructions (Addendum)
Sign release of information at the check out desk for last records from [facility]- in particular want a copy of immunizations  Start back on aspirin 81 mg daily  Schedule a lab visit at the check out desk within 2 weeks. Return for future fasting labs meaning nothing but water after midnight please. Ok to take your medications with water.  With fatty liver and prior elevated liver function test likely would limit to 7 drinks a week or less-that could also be causing your elevated red blood cell size so  Blood pressure slightly high in office-as long as home readings less than 135/85 continue current medication  Lets do 10 minutes daily walking and if you feel good up to 12-15 minutes. Once cleared from next surgery- lets do PT referral here In the office- reach out to [place] and I can place referral - mychart is fine for this  Recommended follow up: Return in about 6 months (around 07/09/2021) for follow up- or sooner if needed.  Or if seeing Dr. Marlou Porch at 6 months we can stretch to yearly and alternate every 6 months - will plan on yearly after that if all findings stable at follow up

## 2021-01-06 NOTE — Progress Notes (Addendum)
Phone: [phone]   Subjective:  Patient presents today to establish care.  Prior patient of Dr. Shelia Media.  Chief Complaint  Patient presents with   New Patient (Initial Visit)   See problem oriented charting  The following were reviewed and entered/updated in epic: Past Medical History:  Diagnosis Date   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 symptoms (LUTS)    CAD (coronary artery disease)    Cholelithiasis    Chronic allergic rhinitis    Coronary atherosclerosis of native coronary artery    Fatty liver    GERD (gastroesophageal reflux disease)    High blood pressure    Hypercholesterolemia    Macrocytosis without anemia    OSA on CPAP    Paraesophageal hernia    Partial bowel obstruction ([place])    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   Sleep apnea    Umbilical hernia without obstruction or gangrene    Patient Active Problem List   Diagnosis Date Noted   S/P hernia repair 10/13/2020    Priority: High   CAD (coronary artery disease) 08/12/2013    Priority: High   B12 deficiency 01/06/2021    Priority: Medium   Vitamin D deficiency 01/06/2021    Priority: Medium   Aortic atherosclerosis ([place]) 01/06/2021    Priority: Medium   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[place]) 01/06/2021    Priority: Medium   BPH associated with nocturia 01/06/2021    Priority: Medium   Abdominal wall abscess 10/28/2020    Priority: Medium   Essential hypertension 03/15/2011    Priority: Medium   Allergic rhinitis 01/06/2021    Priority: Low   Osteoarthritis of glenohumeral joint, left 09/21/2017    Priority: Low   Non-traumatic rotator cuff tear 09/21/2017    Priority: Low   GERD (gastroesophageal reflux disease) 03/15/2011    Priority: Low   Past Surgical History:  Procedure Laterality Date   BIOPSY  10/29/2019   Procedure: BIOPSY;  Surgeon: Jerene Bears, MD;  [facility];  Service: Gastroenterology;;   CARDIAC CATHETERIZATION  2000   Cath  to rule out cardiac problems RT HTN. PT denies significant findings   CARDIAC CATHETERIZATION  2008   COLOSTOMY N/A 10/31/2019   Procedure: COLOSTOMY;  Surgeon: Erroll Luna, MD;  [facility];  Service: General;  Laterality: N/A;   CYSTOSCOPY W/ URETERAL STENT PLACEMENT Bilateral 10/31/2019   Procedure: CYSTOSCOPY URETERAL STENT PLACEMENT BILATERAL;  Surgeon: Franchot Gallo, MD;  [facility];  Service: Urology;  Laterality: Bilateral;   CYSTOSCOPY WITH STENT PLACEMENT Bilateral 03/11/2020   Procedure: CYSTOSCOPY WITH BILATERAL FIREFLY INJECTION;  Surgeon: Irine Seal, MD;  [facility];  Service: Urology;  Laterality: Bilateral;   EYE SURGERY     BILATERAL CATARACT SURGERY WITH LENS IMPLANTS   FLEXIBLE SIGMOIDOSCOPY N/A 10/29/2019   Procedure: FLEXIBLE SIGMOIDOSCOPY;  Surgeon: Jerene Bears, MD;  [facility];  Service: Gastroenterology;  Laterality: N/A;   INCISIONAL HERNIA REPAIR N/A 10/13/2020   Procedure: OPEN INCISIONAL HERNIA REPAIR WITH MESH;  Surgeon: [name], MD;  Location: [place];  Service: General;  Laterality: N/A;   INSERTION OF MESH N/A 09/08/2016   Procedure: INSERTION OF MESH;  Surgeon: Jackolyn Confer, MD;  [facility];  Service: General;  Laterality: N/A;   IR RADIOLOGIST EVAL & MGMT  11/10/2020   IR RADIOLOGIST EVAL & MGMT  11/25/2020   LAPAROSCOPIC SIGMOID COLECTOMY N/A 10/31/2019   Procedure: DIAGNOSTIC LAPAROSCOPY; EXPLORATORY LAPAROTOMY; SIGMOID COLECTOMY;  Surgeon: Erroll Luna, MD;  [facility];  Service: General;  Laterality: N/A;   right rotator cuff     SUBMUCOSAL TATTOO INJECTION  10/29/2019   Procedure: SUBMUCOSAL TATTOO INJECTION;  Surgeon: Jerene Bears, MD;  [facility];  Service: Gastroenterology;;   TONSILLECTOMY     UMBILICAL HERNIA REPAIR N/A 09/08/2016   Procedure: OPEN UMBILICAL HERNIA REPAIR WITH MESH;  Surgeon: Jackolyn Confer, MD;  [facility];  Service: General;  Laterality: N/A;   XI ROBOTIC  ASSISTED COLOSTOMY TAKEDOWN N/A 03/11/2020   Procedure: ROBOTIC ASSISTED COLOSTOMY REVERSAL, RIGID PROCTOSCOPY;  Surgeon: Leighton Ruff, MD;  [facility];  Service: General;  Laterality: N/A;    Family History  Problem Relation Age of Onset   Diabetes Mother    Heart failure Mother        around 78   CAD Father        70   Melanoma Brother 43   Healthy Daughter    Healthy Son    Colon cancer Neg Hx    Esophageal cancer Neg Hx    Rectal cancer Neg Hx    Stomach cancer Neg Hx     Medications- reviewed and updated Current Outpatient Medications  Medication Sig Dispense Refill   aspirin EC 81 MG tablet Take 81 mg by mouth daily. Swallow whole.     atorvastatin (LIPITOR) 80 MG tablet Take 80 mg by mouth daily.     buPROPion (WELLBUTRIN XL) 300 MG 24 hr tablet Take 300 mg by mouth daily.     Cholecalciferol (VITAMIN D3 PO) Take 1,000 Units by mouth daily.     Cyanocobalamin 3000 MCG CAPS Take 3,000 mcg by mouth daily.     finasteride (PROSCAR) 5 MG tablet Take 5 mg by mouth daily.     lisinopril (ZESTRIL) 40 MG tablet Take 40 mg by mouth daily.     loratadine (CLARITIN) 10 MG tablet Take 10 mg by mouth daily.     Omega-3 Fatty Acids (FISH OIL) 1000 MG CAPS Take 1,000 mg by mouth daily.     No current facility-administered medications for this visit.    Allergies-reviewed and updated Allergies  Allergen Reactions   Demerol Other (See Comments)    Causes dizziness   Promethazine Hcl Other (See Comments)    Dizziness     Social History   Social History Narrative   Married. 2 children 52 in 17 in 2022- both went to [employer]. 5 grandkids- all girls from oldest 61 looking premed [employer], 40 soph [place], 39 12 year olds, 54 year olds.       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 Oropharynx normal. TM normal. Eyes: sclera and lids normal, PERRLA Neck: no thyromegaly, no cervical lymphadenopathy, no carotid bruits CV: RRR no murmurs rubs or gallops Lungs: CTAB no crackles, wheeze, rhonchi Abdomen: soft/nontender/nondistended/normal bowel sounds. No rebound or guarding. Increased activity over hernia. midline scar noted  4 cm from midline noted and healing well, 5cm X 2 1/2cm scar noted Ext: minimal edema Skin: warm, dry Neuro: 5/5 strength in upper and lower extremities, normal gait, normal reflexes    Assessment and Plan:  #Social Hx: 2 kids ages 63 and 2, five grandchildren (all girls; ages: 67,19,17,17,10). 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 Went to school at [employer]-undergrad and grad school. Missing fishing with all of the hernia issues he has had in recent years.   Medication refills- in regards to any medicines listed today- team can refill up to 1 year  #History of abdominal wall abscess occurring after hernia repair early march 2022- Dr. Shelia Media hospitalized in later March 2022-had a abdominal wall abscess afterwards and this was drained by IR with drain placed-culture showed staph. And was on antibiotics.  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 -This has been a long arduous process for him-he  is very hopeful that neck surgery will be his last.     #hypertension with element of whitecoat hypertension S: medication: Lisinopril 40 mg  Home readings #s: 130/78 from last home reading BP Readings from Last 3 Encounters:  01/06/21 130/78  11/01/20 129/86  10/15/20 (!) 155/94  A/P: Well-controlled at home-continue current regiment.  Mild poor control in office but reports history of whitecoat hypertension   #hyperlipidemia/CAD/ Aortic Atherosclerosis S: Medication:Atorvastatin 80 mg. Fish oil. Not currently taking Aspirin -- has a Hx of CAD but is not on Aspirin 81 mg. His last visit in person with Dr. Marlou Porch was in 2019. Aortic atherosclerosis was noted on CT. He does want him to start back on in.  A/P: CAD-asymptomatic, continue Atorvastatin 80 mg, restart Aspirin 81 mg.  -Hopefully hyperlipidemia at goal with LDL at least under 70-update lipid panel when patient comes back for fasting labs -For aortic atherosclerosis continue risk factor modification  # Depression S: Medication: Wellbutrin 300mg  XR for at least 5 years Depression screen [facility] 2/9 01/06/2021  Decreased Interest 0  Down, Depressed, Hopeless 0  PHQ - 2 Score 0  Altered sleeping 0  Tired, decreased energy 3  Change in appetite 0  Feeling bad or failure about yourself  0  Trouble concentrating 0  Moving slowly or fidgety/restless 3  Suicidal thoughts 0  PHQ-9 Score 6  Difficult doing work/chores Not difficult at all  A/P: Appears to be in full remission with no anhedonia or depressed mood-continue current medication  # B-12 deficiency S: Cyanocobalamin 3000 mcg  per day A/P: This is a rather high dose-we will get an updated B12 level with labs  #Vitamin D deficiency S: Medication: 1000 units per day A/P: Reports history of deficiency-update B12 level with labs  # Allergies- takes Claritin with reasonable control  #macrocytosis- despite b12 replacement.  Does drink 14 alcoholic beverages at least per  week-discussed limiting in light of fatty liver and this also could be contributing to macrocytosis  # fatty liver- alcohol consumption 2-3 per week -strongly encouraged limiting alcohol intake, likely to limit to 7 drinks or less per week  #BPH-follows with Dr. Jeffie Pollock S: Medication: Finasteride 5 mg  Nocturia- at night around 3 a.m.--  also takes medications at night. A/P: Patient states PSA has been requested by  doctor in the past-he asked me to check this and he will take to his next visit with neurology-I will update but I asked him specifically if he can discuss with urology to see if he needs ongoing PSA given his age  # Exercise and Diet- Exercise: can walk for 10-15 minutes for 4-5 days per week-- does laps around the house, his street-- uses his walking stick when needed. -Lessen alcohol consumption.  -Try to continue 10 minutes daily walking at least 7 days a week and then can increase on days where he feels good up to 12 to 15 minutes-goal 15 minutes consistently by follow-up -Once he is done with upcoming hernia surgery likely refer to physical therapy under imbalance and generalized weakness.  Feels like he has gotten worn down by the prior surgeries  #OSA- he does wear a CPAP due to sleep apnea  #Dermatology concerns- sees Dr Jarome Matin at least annually   Recommended follow up: Return in about 6 months (around 07/09/2021) for follow up- or sooner if needed. Future Appointments  Date Time Provider [place]  07/11/2021  9:20 AM Marin Olp, MD [facility]   Time Spent: 65 minutes of total time (1:51 PM- 2:56 PM) was spent on the date of the encounter performing the following actions: chart review prior to seeing the patient, obtaining history, performing a medically necessary exam, counseling on the treatment plan particularly restarting aspirin, placing orders, and documenting in our EHR.   I,Harris Phan,acting as a Education administrator for Garret Reddish, MD.,have  documented all relevant documentation on the behalf of Garret Reddish, MD,as directed by  Garret Reddish, MD while in the presence of Garret Reddish, MD.   I, Garret Reddish, MD, have reviewed all documentation for this visit. The documentation on 01/06/21 for the exam, diagnosis, procedures, and orders are all accurate and complete.   Return precautions advised. Garret Reddish, MD

## 2021-01-11 ENCOUNTER — Other Ambulatory Visit: Payer: Self-pay | Admitting: General Surgery

## 2021-01-11 DIAGNOSIS — K439 Ventral hernia without obstruction or gangrene: Secondary | ICD-10-CM

## 2021-01-14 ENCOUNTER — Ambulatory Visit
Admission: RE | Admit: 2021-01-14 | Discharge: 2021-01-14 | Disposition: A | Discharge status: Home / Self Care | Payer: Medicare Other | Source: Ambulatory Visit | Attending: General Surgery | Admitting: General Surgery

## 2021-01-14 DIAGNOSIS — M4316 Spondylolisthesis, lumbar region: Secondary | ICD-10-CM | POA: Diagnosis not present

## 2021-01-14 DIAGNOSIS — K8020 Calculus of gallbladder without cholecystitis without obstruction: Secondary | ICD-10-CM | POA: Diagnosis not present

## 2021-01-14 DIAGNOSIS — N281 Cyst of kidney, acquired: Secondary | ICD-10-CM | POA: Diagnosis not present

## 2021-01-14 DIAGNOSIS — K5790 Diverticulosis of intestine, part unspecified, without perforation or abscess without bleeding: Secondary | ICD-10-CM | POA: Diagnosis not present

## 2021-01-14 DIAGNOSIS — K439 Ventral hernia without obstruction or gangrene: Secondary | ICD-10-CM

## 2021-01-14 IMAGING — CT CT ABD-PELV W/ CM
2 of 5 series · 16 of 46 positions shown, 18 images · IV contrast (iopamidol)
Comparison: [DATE] and previous

CLINICAL DATA: History of colostomy with reversal, hernia repair
with abscess, drain placed [DATE] and removed [DATE]

EXAM:
CT ABDOMEN AND PELVIS WITH CONTRAST
TECHNIQUE: Multidetector CT imaging of the abdomen and pelvis was performed
using the standard protocol following bolus administration of
intravenous contrast.
CONTRAST:  100mL [EB] IOPAMIDOL ([EB]) INJECTION 61%

[Series 2: abd pelvis 5.00 br40 s3 axial · axial · 0.90mm/px · z∈[+1159,+1539]mm · 13 of 88 slices shown, 15 images]
[im 6/88  soft-tissue]
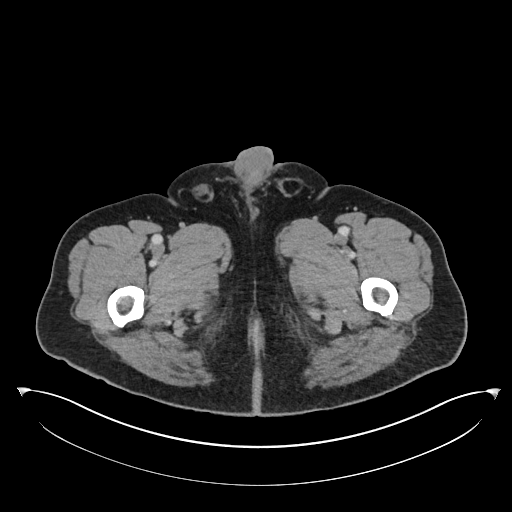
[im 6/88  bone]
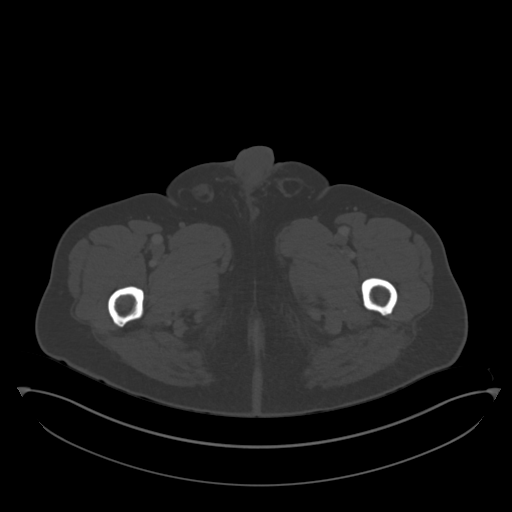
[im 11/88  soft-tissue]
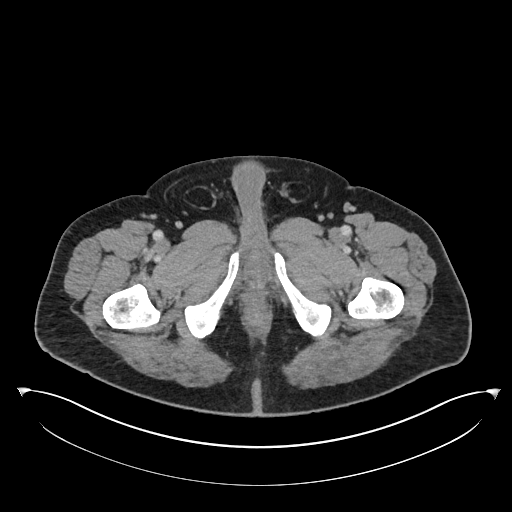
[im 21/88  soft-tissue]
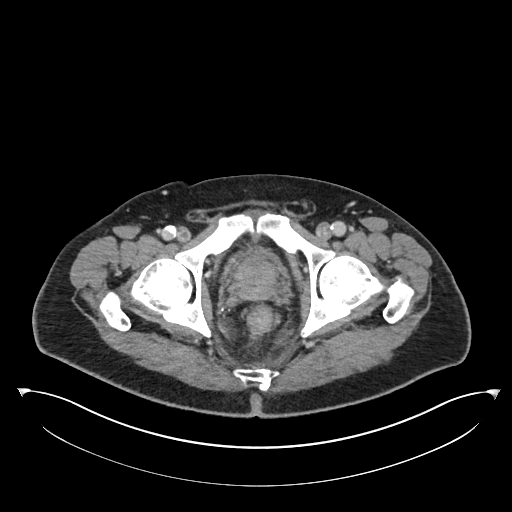
[im 26/88  soft-tissue]
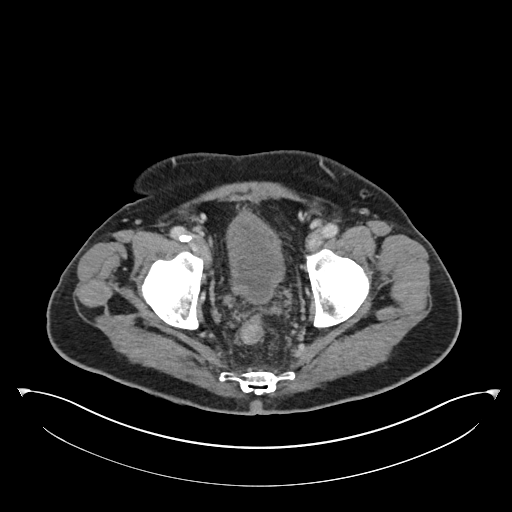
[im 31/88  soft-tissue]
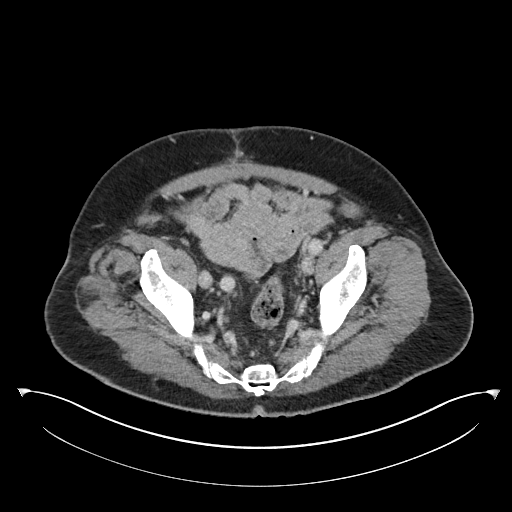
[im 36/88  soft-tissue]
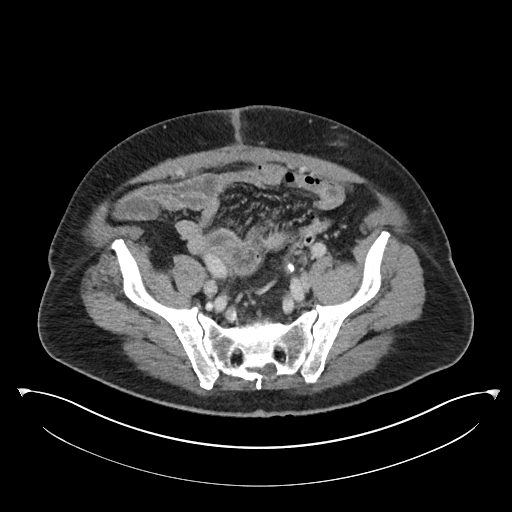
[im 47/88  soft-tissue]
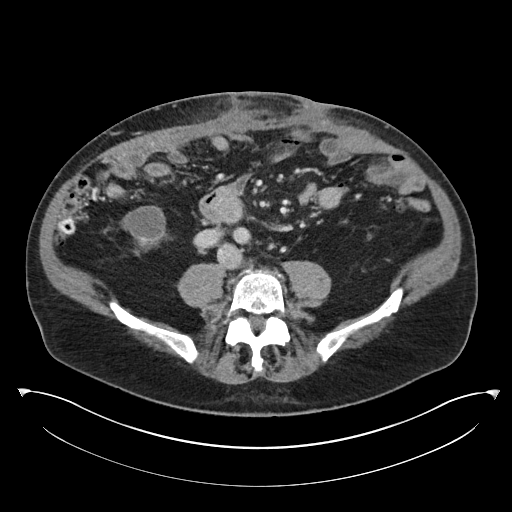
[im 52/88  soft-tissue]
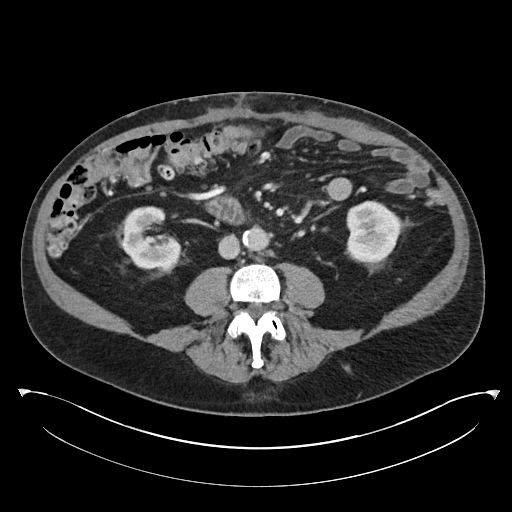
[im 57/88  soft-tissue]
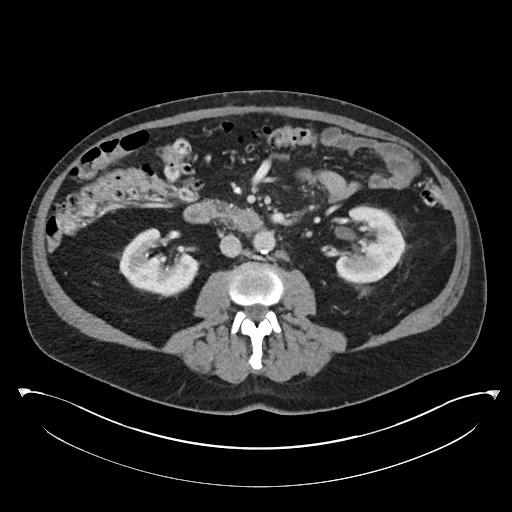
[im 57/88  bone]
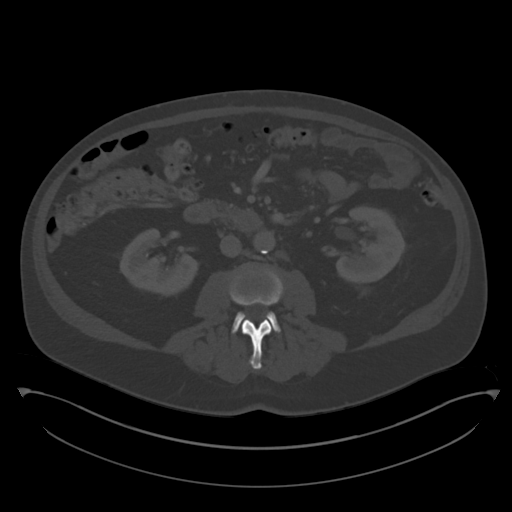
[im 62/88  soft-tissue]
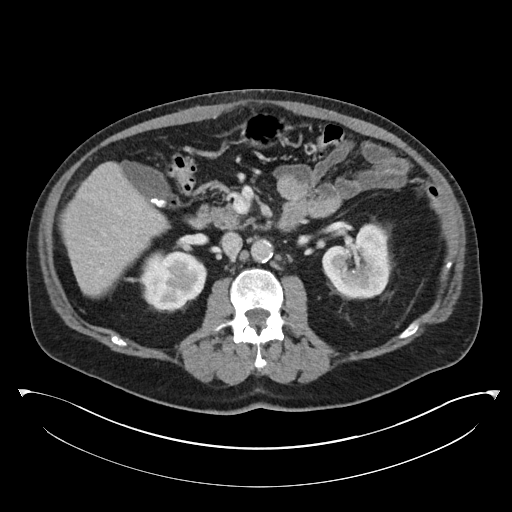
[im 67/88  soft-tissue]
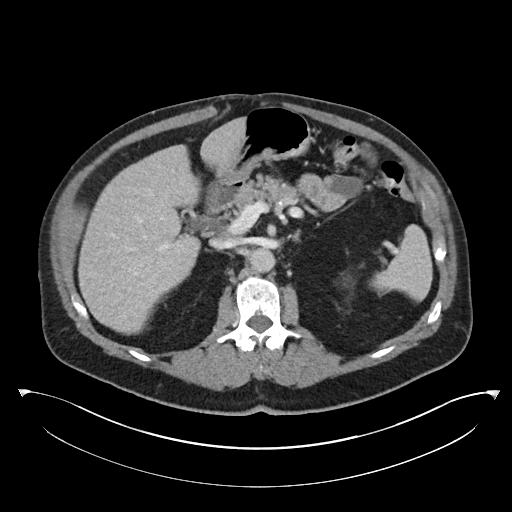
[im 77/88  soft-tissue]
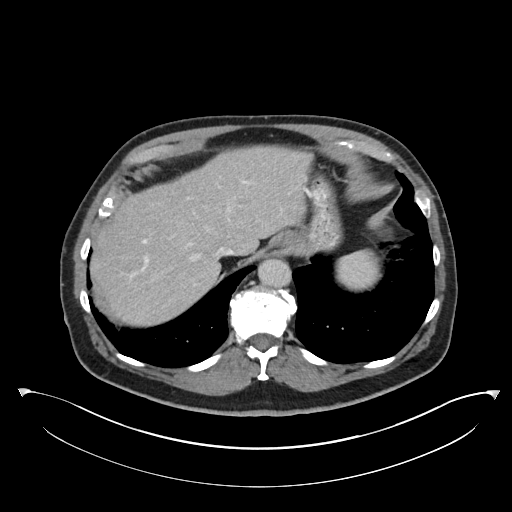
[im 82/88  soft-tissue]
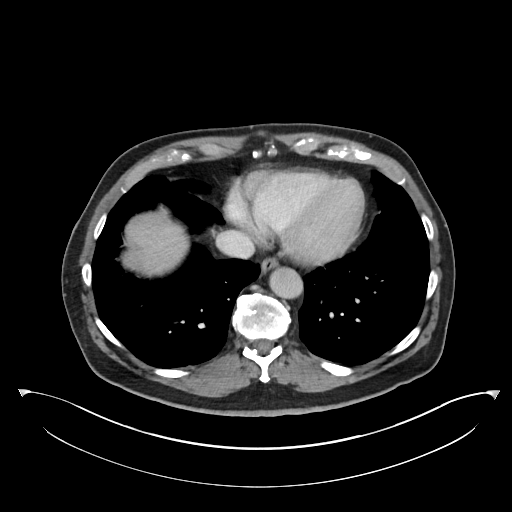

[Series 6: abd pelvis 2.00 br40 s3 cor · coronal · 0.86mm/px · 3 of 230 slices shown]
[im 77/230  soft-tissue]
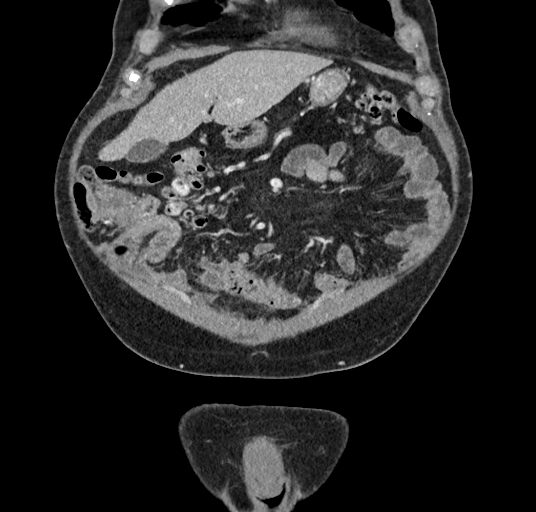
[im 102/230  soft-tissue]
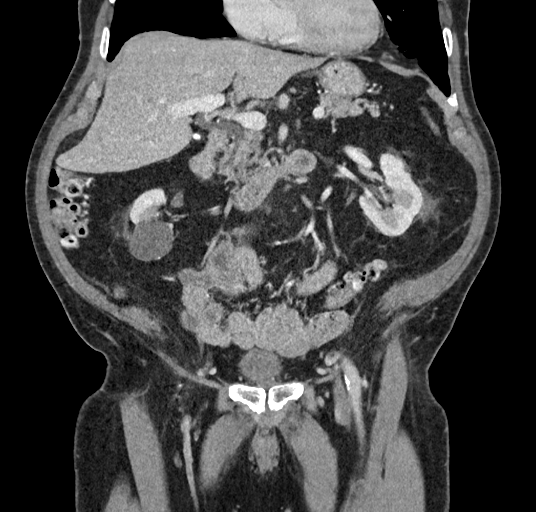
[im 128/230  soft-tissue]
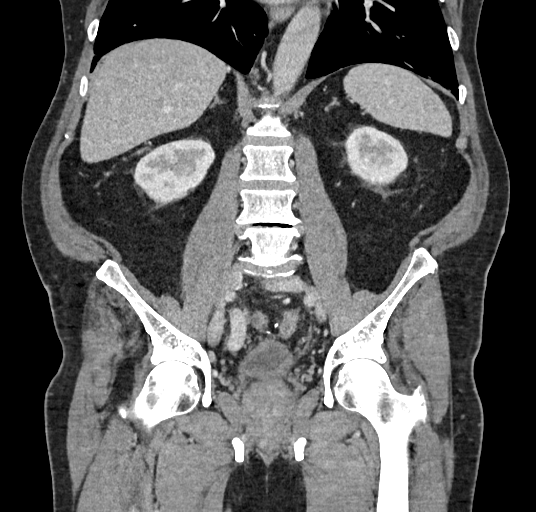

[16 of 46 positions shown; findings below may reference images not displayed]

FINDINGS: Lower chest: No acute abnormality.

Hepatobiliary: Multiple partially calcified stones measured up to
1.1 cm in the dependent aspect of the nondistended gallbladder. No
adjacent inflammatory change or wall thickening evident. No liver
lesion or biliary ductal dilatation.

Pancreas: Unremarkable. No pancreatic ductal dilatation or
surrounding inflammatory changes.

Spleen: Normal in size without focal abnormality.

Adrenals/Urinary Tract: Adrenal glands unremarkable. 3.6 cm cyst
from the lower pole right kidney, decreased in size since
[DATE]. No hydronephrosis. Urinary bladder incompletely
distended.

Stomach/Bowel: Stomach and small bowel are nondistended. The colon
is nondilated with innumerable diverticula. Staple line in the
distal sigmoid segment. No adjacent inflammatory change.

Vascular/Lymphatic: Scattered aortoiliac calcified plaque without
aneurysm or evident stenosis. No abdominal or pelvic adenopathy.
Portal vein patent.

Reproductive: Prostate is unremarkable.

Other: No ascites.  No free air.

Musculoskeletal: Slight improvement in the inflammatory/edematous
changes of the anterior abdominal wall at the site of previous
drain. No recurrent drainable fluid collection or worsening
component is identified.

Spondylitic changes in the lower lumbar spine with stable grade 1
anterolisthesis L4-5 probably related to advanced facet DJD at that
level. No fracture or worrisome bone lesion
IMPRESSION: 1. Stable anterior abdominal wall appearance post drain catheter
removal. No evidence of residual or recurrent abscess.
2. Cholelithiasis
3.  Aortic Atherosclerosis ([EB]-170.0).

## 2021-01-14 MED ORDER — IOPAMIDOL (ISOVUE-300) INJECTION 61%
100.0000 mL | Freq: Once | INTRAVENOUS | Status: AC | PRN
  Administered 2021-01-14: 100 mL via INTRAVENOUS

## 2021-01-17 ENCOUNTER — Other Ambulatory Visit (INDEPENDENT_AMBULATORY_CARE_PROVIDER_SITE_OTHER): Payer: Medicare Other

## 2021-01-17 ENCOUNTER — Other Ambulatory Visit: Payer: Self-pay

## 2021-01-17 DIAGNOSIS — I2510 Atherosclerotic heart disease of native coronary artery without angina pectoris: Secondary | ICD-10-CM | POA: Diagnosis not present

## 2021-01-17 DIAGNOSIS — N401 Enlarged prostate with lower urinary tract symptoms: Secondary | ICD-10-CM

## 2021-01-17 DIAGNOSIS — E538 Deficiency of other specified B group vitamins: Secondary | ICD-10-CM | POA: Diagnosis not present

## 2021-01-17 DIAGNOSIS — E559 Vitamin D deficiency, unspecified: Secondary | ICD-10-CM | POA: Diagnosis not present

## 2021-01-17 DIAGNOSIS — Z1159 Encounter for screening for other viral diseases: Secondary | ICD-10-CM | POA: Diagnosis not present

## 2021-01-17 DIAGNOSIS — R351 Nocturia: Secondary | ICD-10-CM | POA: Diagnosis not present

## 2021-01-17 LAB — VITAMIN D 25 HYDROXY (VIT D DEFICIENCY, FRACTURES): VITD: 42.24 ng/mL (ref 30.00–100.00)

## 2021-01-17 LAB — CBC WITH DIFFERENTIAL/PLATELET
Basophils Absolute: 0 10*3/uL (ref 0.0–0.1)
Basophils Relative: 0.6 % (ref 0.0–3.0)
Eosinophils Absolute: 0.2 10*3/uL (ref 0.0–0.7)
Eosinophils Relative: 3.5 % (ref 0.0–5.0)
HCT: 39.6 % (ref 39.0–52.0)
Hemoglobin: 13.5 g/dL (ref 13.0–17.0)
Lymphocytes Relative: 36.8 % (ref 12.0–46.0)
Lymphs Abs: 2.1 10*3/uL (ref 0.7–4.0)
MCHC: 34.2 g/dL (ref 30.0–36.0)
MCV: 100.3 fl — ABNORMAL HIGH (ref 78.0–100.0)
Monocytes Absolute: 0.9 10*3/uL (ref 0.1–1.0)
Monocytes Relative: 16.1 % — ABNORMAL HIGH (ref 3.0–12.0)
Neutro Abs: 2.4 10*3/uL (ref 1.4–7.7)
Neutrophils Relative %: 43 % (ref 43.0–77.0)
Platelets: 189 10*3/uL (ref 150.0–400.0)
RBC: 3.95 Mil/uL — ABNORMAL LOW (ref 4.22–5.81)
RDW: 15.6 % — ABNORMAL HIGH (ref 11.5–15.5)
WBC: 5.6 10*3/uL (ref 4.0–10.5)

## 2021-01-17 LAB — VITAMIN B12: Vitamin B-12: >1550 pg/mL — ABNORMAL HIGH (ref 211–911)

## 2021-01-17 LAB — PSA: PSA: 1.47 ng/mL (ref 0.10–4.00)

## 2021-01-18 LAB — LIPID PANEL
Cholesterol: 107 mg/dL (ref 0–200)
HDL: 41.9 mg/dL (ref >39.00)
LDL Cholesterol: 52 mg/dL (ref 0–99)
NonHDL: 64.84
Total CHOL/HDL Ratio: 3
Triglycerides: 64 mg/dL (ref 0.0–149.0)
VLDL: 12.8 mg/dL (ref 0.0–40.0)

## 2021-01-18 LAB — COMPREHENSIVE METABOLIC PANEL
ALT: 30 U/L (ref 0–53)
AST: 27 U/L (ref 0–37)
Albumin: 3.7 g/dL (ref 3.5–5.2)
Alkaline Phosphatase: 92 U/L (ref 39–117)
BUN: 11 mg/dL (ref 6–23)
CO2: 28 mEq/L (ref 19–32)
Calcium: 9.1 mg/dL (ref 8.4–10.5)
Chloride: 101 mEq/L (ref 96–112)
Creatinine, Ser: 0.77 mg/dL (ref 0.40–1.50)
GFR: 85.32 mL/min (ref >60.00)
Glucose, Bld: 108 mg/dL — ABNORMAL HIGH (ref 70–99)
Potassium: 4.6 mEq/L (ref 3.5–5.1)
Sodium: 135 mEq/L (ref 135–145)
Total Bilirubin: 0.5 mg/dL (ref 0.2–1.2)
Total Protein: 9.6 g/dL — ABNORMAL HIGH (ref 6.0–8.3)

## 2021-01-18 LAB — HEPATITIS C ANTIBODY
Hepatitis C Ab: NONREACTIVE
SIGNAL TO CUT-OFF: 0.01 (ref <1.00)

## 2021-01-19 DIAGNOSIS — R351 Nocturia: Secondary | ICD-10-CM | POA: Diagnosis not present

## 2021-01-19 DIAGNOSIS — N5201 Erectile dysfunction due to arterial insufficiency: Secondary | ICD-10-CM | POA: Diagnosis not present

## 2021-01-19 DIAGNOSIS — N403 Nodular prostate with lower urinary tract symptoms: Secondary | ICD-10-CM | POA: Diagnosis not present

## 2021-01-19 DIAGNOSIS — R972 Elevated prostate specific antigen [PSA]: Secondary | ICD-10-CM | POA: Diagnosis not present

## 2021-01-25 ENCOUNTER — Other Ambulatory Visit: Payer: Medicare Other

## 2021-01-26 ENCOUNTER — Ambulatory Visit: Payer: Self-pay | Admitting: General Surgery

## 2021-01-26 DIAGNOSIS — Z8719 Personal history of other diseases of the digestive system: Secondary | ICD-10-CM | POA: Diagnosis not present

## 2021-01-26 DIAGNOSIS — Z9889 Other specified postprocedural states: Secondary | ICD-10-CM | POA: Diagnosis not present

## 2021-01-26 DIAGNOSIS — K439 Ventral hernia without obstruction or gangrene: Secondary | ICD-10-CM | POA: Diagnosis not present

## 2021-01-26 NOTE — H&P (Signed)
History of Present Illness [name] MD; 01/26/2021 10:55 AM) The patient is a 80 year old male presenting for a post-operative visit. Patient follow back up today secondary to a bulge in his upper abdomen.  Patient underwent CT scan which I reviewed personally.  Patient does have a small to 3 cm hernia in his epigastrium. Patient has noticed a bulge in the upper epigastric region.  He states he has no pain or discomfort.  Patient has been want to increase his activity to increase his muscle strength in stamina.     Allergies (Charmella Little, CNA; 01/26/2021 10:35 AM) Demerol *ANALGESICS - OPIOID*   Phenergan *ANTIHISTAMINES*   Promethazine HCl *ANTIHISTAMINES*   Allergies Reconciled      Review of Systems [name], MD; 01/26/2021 10:57 AM) General Not Present- Appetite Loss, Chills, Fatigue, Fever, Night Sweats, Weight Gain and Weight Loss. Skin Not Present- Change in Wart/Mole, Dryness, Hives, Jaundice, New Lesions, Non-Healing Wounds, Rash and Ulcer. HEENT Present- Seasonal Allergies and Wears glasses/contact lenses. Not Present- Earache, Hearing Loss, Hoarseness, Nose Bleed, Oral Ulcers, Ringing in the Ears, Sinus Pain, Sore Throat, Visual Disturbances and Yellow Eyes. Respiratory Present- Chronic Cough. Not Present- Bloody sputum, Difficulty Breathing, Snoring and Wheezing. Breast Not Present- Breast Mass, Breast Pain, Nipple Discharge and Skin Changes. Cardiovascular Not Present- Chest Pain, Difficulty Breathing Lying Down, Leg Cramps, Palpitations, Rapid Heart Rate, Shortness of Breath and Swelling of Extremities. Gastrointestinal Not Present- Abdominal Pain, Bloating, Bloody Stool, Change in Bowel Habits, Chronic diarrhea, Constipation, Difficulty Swallowing, Excessive gas, Gets full quickly at meals, Hemorrhoids, Indigestion, Nausea, Rectal Pain and Vomiting. Male Genitourinary Not Present- Blood in Urine, Change in Urinary Stream, Frequency, Impotence, Nocturia,  Painful Urination, Urgency and Urine Leakage. Musculoskeletal Not Present- Back Pain, Joint Pain, Joint Stiffness, Muscle Pain, Muscle Weakness and Swelling of Extremities. Neurological Not Present- Decreased Memory, Fainting, Headaches, Numbness, Seizures, Tingling, Tremor, Trouble walking and Weakness. Psychiatric Present- Anxiety and Depression. Not Present- Bipolar, Change in Sleep Pattern, Fearful and Frequent crying. Endocrine Not Present- Cold Intolerance, Excessive Hunger, Hair Changes, Heat Intolerance and New Diabetes. Hematology Not Present- Easy Bruising, Excessive bleeding, Gland problems, HIV and Persistent Infections.  Vitals (Charmella Little CNA; 01/26/2021 10:35 AM) 01/26/2021 10:35 AM Weight: 190 lb   Height: 68 in  Body Surface Area: 2 m   Body Mass Index: 28.89 kg/m   Temp.: 98.5 F    Pulse: 95 (Regular)    P.OX: 98% (Room air) BP: 180/100(Sitting, Left Arm, Standard)       Physical Exam [name] MD; 01/26/2021 10:56 AM) General Mental Status - Alert. General Appearance - Consistent with stated age. Hydration - Well hydrated. Voice - Normal.  Chest and Lung Exam Inspection Chest Wall - Normal. Back - normal.  Cardiovascular Cardiovascular examination reveals  - on palpation PMI is normal in location and amplitude, no palpable S3 or S4. Normal cardiac borders., normal heart sounds, regular rate and rhythm with no murmurs, carotid auscultation reveals no bruits and normal pedal pulses bilaterally.  Abdomen Inspection Inspection of the abdomen reveals - Note: Epigastric hernia, to 3 cm.    Assessment & Plan [name] MD; 01/26/2021 10:56 AM) S/P HERNIA REPAIR ([id number]) Impression: Patient is a 80 year old male status post open pseudohernia repair with mesh. This was completed postoperatively by large seroma. This was drained. Patient is back in today secondary to a bulge he notices and epigastrium.  1. The patient will like to proceed to  the operating room for laparoscopic ventral hernia  repair with mesh.  2. I discussed with the patient the signs and symptoms of incarceration and strangulation and the need to proceed to the ER should they occur.  3. I discussed with the patient the risks and benefits of the procedure to include but not limited to: Infection, bleeding, damage to surrounding structures, possible need for further surgery, possible nerve pain, and possible recurrence. The patient was understanding and wishes to proceed.

## 2021-01-26 NOTE — H&P (View-Only) (Signed)
History of Present Illness [name] MD; 01/26/2021 10:55 AM) The patient is a 80 year old male presenting for a post-operative visit. Patient follow back up today secondary to a bulge in his upper abdomen.  Patient underwent CT scan which I reviewed personally.  Patient does have a small to 3 cm hernia in his epigastrium. Patient has noticed a bulge in the upper epigastric region.  He states he has no pain or discomfort.  Patient has been want to increase his activity to increase his muscle strength in stamina.     Allergies (Charmella Little, CNA; 01/26/2021 10:35 AM) Demerol *ANALGESICS - OPIOID*   Phenergan *ANTIHISTAMINES*   Promethazine HCl *ANTIHISTAMINES*   Allergies Reconciled      Review of Systems [name], MD; 01/26/2021 10:57 AM) General Not Present- Appetite Loss, Chills, Fatigue, Fever, Night Sweats, Weight Gain and Weight Loss. Skin Not Present- Change in Wart/Mole, Dryness, Hives, Jaundice, New Lesions, Non-Healing Wounds, Rash and Ulcer. HEENT Present- Seasonal Allergies and Wears glasses/contact lenses. Not Present- Earache, Hearing Loss, Hoarseness, Nose Bleed, Oral Ulcers, Ringing in the Ears, Sinus Pain, Sore Throat, Visual Disturbances and Yellow Eyes. Respiratory Present- Chronic Cough. Not Present- Bloody sputum, Difficulty Breathing, Snoring and Wheezing. Breast Not Present- Breast Mass, Breast Pain, Nipple Discharge and Skin Changes. Cardiovascular Not Present- Chest Pain, Difficulty Breathing Lying Down, Leg Cramps, Palpitations, Rapid Heart Rate, Shortness of Breath and Swelling of Extremities. Gastrointestinal Not Present- Abdominal Pain, Bloating, Bloody Stool, Change in Bowel Habits, Chronic diarrhea, Constipation, Difficulty Swallowing, Excessive gas, Gets full quickly at meals, Hemorrhoids, Indigestion, Nausea, Rectal Pain and Vomiting. Male Genitourinary Not Present- Blood in Urine, Change in Urinary Stream, Frequency, Impotence, Nocturia,  Painful Urination, Urgency and Urine Leakage. Musculoskeletal Not Present- Back Pain, Joint Pain, Joint Stiffness, Muscle Pain, Muscle Weakness and Swelling of Extremities. Neurological Not Present- Decreased Memory, Fainting, Headaches, Numbness, Seizures, Tingling, Tremor, Trouble walking and Weakness. Psychiatric Present- Anxiety and Depression. Not Present- Bipolar, Change in Sleep Pattern, Fearful and Frequent crying. Endocrine Not Present- Cold Intolerance, Excessive Hunger, Hair Changes, Heat Intolerance and New Diabetes. Hematology Not Present- Easy Bruising, Excessive bleeding, Gland problems, HIV and Persistent Infections.  Vitals (Charmella Little CNA; 01/26/2021 10:35 AM) 01/26/2021 10:35 AM Weight: 190 lb   Height: 68 in  Body Surface Area: 2 m   Body Mass Index: 28.89 kg/m   Temp.: 98.5 F    Pulse: 95 (Regular)    P.OX: 98% (Room air) BP: 180/100(Sitting, Left Arm, Standard)       Physical Exam [name] MD; 01/26/2021 10:56 AM) General Mental Status - Alert. General Appearance - Consistent with stated age. Hydration - Well hydrated. Voice - Normal.  Chest and Lung Exam Inspection Chest Wall - Normal. Back - normal.  Cardiovascular Cardiovascular examination reveals  - on palpation PMI is normal in location and amplitude, no palpable S3 or S4. Normal cardiac borders., normal heart sounds, regular rate and rhythm with no murmurs, carotid auscultation reveals no bruits and normal pedal pulses bilaterally.  Abdomen Inspection Inspection of the abdomen reveals - Note: Epigastric hernia, to 3 cm.    Assessment & Plan [name] MD; 01/26/2021 10:56 AM) S/P HERNIA REPAIR ([id number]) Impression: Patient is a 80 year old male status post open pseudohernia repair with mesh. This was completed postoperatively by large seroma. This was drained. Patient is back in today secondary to a bulge he notices and epigastrium.  1. The patient will like to proceed to  the operating room for laparoscopic ventral hernia  repair with mesh.  2. I discussed with the patient the signs and symptoms of incarceration and strangulation and the need to proceed to the ER should they occur.  3. I discussed with the patient the risks and benefits of the procedure to include but not limited to: Infection, bleeding, damage to surrounding structures, possible need for further surgery, possible nerve pain, and possible recurrence. The patient was understanding and wishes to proceed.

## 2021-02-02 ENCOUNTER — Encounter (HOSPITAL_COMMUNITY): Payer: Self-pay | Admitting: General Surgery

## 2021-02-02 ENCOUNTER — Other Ambulatory Visit: Payer: Self-pay

## 2021-02-02 NOTE — Progress Notes (Signed)
Anesthesia Chart Review: Same day workup  Follows with cardiology for history of coronary artery calcification/atherosclerosis/aortic atherosclerosis.  CT scan showed diffuse LAD calcification, mild proximal circumflex calcification, diffuse coronary atherosclerosis.  Remote heart cath 2008 was reportedly normal, he had a nuclear stress in 2017 which was low risk, no ischemia.  Last seen by Dr. Marlou Porch 07/19/2020.  Recommended continue aggressive secondary prevention.  Discussed that he will be seeing Dr. Rosendo Gros for hernia repair.  Per note, "If he is going to pursue further abdominal surgery, hernia repair for instance, he may proceed with low overall cardiac risk.  Able to achieve greater than 4 METS of activity without difficulty.  Prior stress test low risk."   Underwent open incisional hernia repair 10/13/20.  OSA on CPAP.   Labs 6/622 reviewed, unremarkable.  EKG 10/28/20: Sinus rhythm. Rate 90. Borderline prolonged PR interval. RSR' in V1 or V2, right VCD or RVH   EKG 03/01/2020: Sinus rhythm with 1st degree A-V block with occasional Premature ventricular complexes.  Rate 86. No significant change since last tracing   Nuclear stress 04/11/2016:  Nuclear stress EF: 68%.  There was no ST segment deviation noted during stress.  The study is normal. no ischemia. no evidence of previous MI  This is a low risk study.   TTE 08/13/2013: - Left ventricle: The cavity size was normal. Wall thickness    was increased in a pattern of mild LVH. Systolic function    was normal. The estimated ejection fraction was in the    range of 60% to 65%. Wall motion was normal; there were no    regional wall motion abnormalities. Features are    consistent with a pseudonormal left ventricular filling    pattern, with concomitant abnormal relaxation and    increased filling pressure (grade 2 diastolic    dysfunction).  - Aortic valve: There was no stenosis.  - Aorta: Mildly dilated aortic root. Aortic root  dimension:    13mm (ED).  - Mitral valve: No significant regurgitation.  - Right ventricle: The cavity size was normal. Systolic    function was normal.  - Pulmonary arteries: No complete TR doppler jet so unable    to estimate PA systolic pressure.  - Inferior vena cava: The vessel was normal in size; the    respirophasic diameter changes were in the normal range (=    50%); findings are consistent with normal central venous    pressure.  Impressions:   - Normal LV size with mild LV hypertrophy. EF 60-65%.    Moderate diastolic dysfunction. Normal RV size and    systolic function. No significant valvular abnormalities.    Wynonia Musty [facility] Stay [facility]/Anesthesiology Phone [phone] 02/02/2021 9:48 AM'

## 2021-02-02 NOTE — Anesthesia Preprocedure Evaluation (Addendum)
Anesthesia Evaluation  Patient identified by MRN, date of birth, ID band Patient awake    Reviewed: Allergy & Precautions, NPO status , Patient's Chart, lab work & pertinent test results  Airway Mallampati: III  TM Distance: >3 FB Neck ROM: Full    Dental  (+) Teeth Intact, Dental Advisory Given   Pulmonary sleep apnea and Continuous Positive Airway Pressure Ventilation ,    Pulmonary exam normal breath sounds clear to auscultation       Cardiovascular hypertension, Pt. on medications + CAD  Normal cardiovascular exam Rhythm:Regular Rate:Normal     Neuro/Psych PSYCHIATRIC DISORDERS Depression negative neurological ROS     GI/Hepatic Neg liver ROS, hiatal hernia, GERD  ,Ventral hernia    Endo/Other  negative endocrine ROS  Renal/GU negative Renal ROS     Musculoskeletal  (+) Arthritis ,   Abdominal   Peds  Hematology negative hematology ROS (+)   Anesthesia Other Findings Day of surgery medications reviewed with the patient.  Reproductive/Obstetrics                            Anesthesia Physical Anesthesia Plan  ASA: 3  Anesthesia Plan: General   Post-op Pain Management:    Induction: Intravenous  PONV Risk Score and Plan: 3 and Dexamethasone, Ondansetron and Treatment may vary due to age or medical condition  Airway Management Planned: Oral ETT  Additional Equipment:   Intra-op Plan:   Post-operative Plan: Extubation in OR  Informed Consent: I have reviewed the patients History and Physical, chart, labs and discussed the procedure including the risks, benefits and alternatives for the proposed anesthesia with the patient or authorized representative who has indicated his/her understanding and acceptance.     Dental advisory given  Plan Discussed with: CRNA  Anesthesia Plan Comments: (PAT note by Karoline Caldwell, PA-C: Follows with cardiology for history of coronary artery  calcification/atherosclerosis/aortic atherosclerosis. CT scan showed diffuse LAD calcification, mild proximal circumflex calcification, diffuse coronary atherosclerosis. Remote heart cath 2008 was reportedly normal, he had a nuclear stress in 2017 which was low risk, no ischemia. Last seen by Dr. Marlou Porch 07/19/2020. Recommended continue aggressive secondary prevention. Discussed that he will be seeing Dr. Rosendo Gros for hernia repair. Per note, "If he is going to pursue further abdominal surgery, hernia repair for instance, he may proceed with low overall cardiac risk. Able to achieve greater than 4 METS of activity without difficulty. Prior stress test low risk."  Underwent open incisional hernia repair 10/13/20.  OSA on CPAP.  Labs 6/622 reviewed, unremarkable.  EKG 10/28/20: Sinus rhythm. Rate 90. Borderline prolonged PR interval. RSR' in V1 or V2, right VCD or RVH  EKG 03/01/2020:Sinus rhythm with 1st degree A-V block with occasional Premature ventricular complexes. Rate 86. No significant change since last tracing  Nuclear stress 04/11/2016:  Nuclear stress EF: 68%.  There was no ST segment deviation noted during stress.  The study is normal. no ischemia. no evidence of previous MI  This is a low risk study.  TTE 08/13/2013: - Left ventricle: The cavity size was normal. Wall thickness  was increased in a pattern of mild LVH. Systolic function  was normal. The estimated ejection fraction was in the  range of 60% to 65%. Wall motion was normal; there were no  regional wall motion abnormalities. Features are  consistent with a pseudonormal left ventricular filling  pattern, with concomitant abnormal relaxation and  increased filling pressure (grade 2 diastolic  dysfunction).  -  Aortic valve: There was no stenosis.  - Aorta: Mildly dilated aortic root. Aortic root dimension:  35mm (ED).  - Mitral valve: No significant regurgitation.  - Right ventricle: The  cavity size was normal. Systolic  function was normal.  - Pulmonary arteries: No complete TR doppler jet so unable  to estimate PA systolic pressure.  - Inferior vena cava: The vessel was normal in size; the  respirophasic diameter changes were in the normal range (=  50%); findings are consistent with normal central venous  pressure.  Impressions:   - Normal LV size with mild LV hypertrophy. EF 60-65%.  Moderate diastolic dysfunction. Normal RV size and  systolic function. No significant valvular abnormalities. )       Anesthesia Quick Evaluation

## 2021-02-02 NOTE — Progress Notes (Signed)
DUE TO COVID-19 ONLY ONE VISITOR IS ALLOWED TO COME WITH YOU AND STAY IN THE WAITING ROOM ONLY DURING PRE OP AND PROCEDURE DAY OF SURGERY.   PCP - Dr Garret Reddish Cardiologist - n/a  Chest x-ray - 10/28/20 (1V) EKG - 10/28/20 Stress Test - 04/11/16 ECHO - 08/13/13 Cardiac Cath - 2008  Sleep Study -  Yes CPAP - uses cpap  Aspirin Instructions: Follow your surgeon's instructions on when to stop aspirin prior to surgery,  If no instructions were given by your surgeon then you will need to call the office for those instructions.  ERAS: Clear liquids til 11 am day of surgery  Anesthesia review: Yes  STOP now taking any Aspirin (unless otherwise instructed by your surgeon), Aleve, Naproxen, Ibuprofen, Motrin, Advil, Goody's, BC's, all herbal medications, fish oil, and all vitamins.   Coronavirus Screening Covid test n/a - Ambulatory Surgery Do you have any of the following symptoms:  Cough yes/no: No Fever (>100.51F)  yes/no: No Runny nose yes/no: No Sore throat yes/no: No Difficulty breathing/shortness of breath  yes/no: No  Have you traveled in the last 14 days and where? yes/no: No  Patient verbalized understanding of instructions that were given via phone.

## 2021-02-03 ENCOUNTER — Encounter (HOSPITAL_COMMUNITY): Payer: Self-pay | Admitting: General Surgery

## 2021-02-03 ENCOUNTER — Ambulatory Visit (HOSPITAL_COMMUNITY): Payer: Medicare Other | Admitting: Physician Assistant

## 2021-02-03 ENCOUNTER — Ambulatory Visit (HOSPITAL_COMMUNITY)
Admission: RE | Admit: 2021-02-03 | Discharge: 2021-02-03 | Disposition: A | Discharge status: Home / Self Care | Payer: Medicare Other | Attending: General Surgery | Admitting: General Surgery

## 2021-02-03 ENCOUNTER — Encounter (HOSPITAL_COMMUNITY)
Admission: RE | Disposition: A | Discharge status: Home / Self Care | Payer: Self-pay | Source: Home / Self Care | Attending: General Surgery

## 2021-02-03 DIAGNOSIS — Z888 Allergy status to other drugs, medicaments and biological substances status: Secondary | ICD-10-CM | POA: Insufficient documentation

## 2021-02-03 DIAGNOSIS — G4733 Obstructive sleep apnea (adult) (pediatric): Secondary | ICD-10-CM | POA: Diagnosis not present

## 2021-02-03 DIAGNOSIS — K439 Ventral hernia without obstruction or gangrene: Secondary | ICD-10-CM | POA: Insufficient documentation

## 2021-02-03 DIAGNOSIS — Z885 Allergy status to narcotic agent status: Secondary | ICD-10-CM | POA: Diagnosis not present

## 2021-02-03 DIAGNOSIS — E7800 Pure hypercholesterolemia, unspecified: Secondary | ICD-10-CM | POA: Diagnosis not present

## 2021-02-03 DIAGNOSIS — Z9989 Dependence on other enabling machines and devices: Secondary | ICD-10-CM | POA: Diagnosis not present

## 2021-02-03 HISTORY — PX: VENTRAL HERNIA REPAIR: SHX424

## 2021-02-03 LAB — SURGICAL PCR SCREEN
MRSA, PCR: NEGATIVE
Staphylococcus aureus: NEGATIVE

## 2021-02-03 SURGERY — REPAIR, HERNIA, VENTRAL, LAPAROSCOPIC
Anesthesia: General

## 2021-02-03 MED ORDER — CHLORHEXIDINE GLUCONATE CLOTH 2 % EX PADS: 6.0000 | MEDICATED_PAD | Freq: Once | CUTANEOUS | Status: DC

## 2021-02-03 MED ORDER — LABETALOL HCL 5 MG/ML IV SOLN
10.0000 mg | INTRAVENOUS | Status: AC | PRN
  Administered 2021-02-03 (×2): 10 mg via INTRAVENOUS

## 2021-02-03 MED ORDER — FENTANYL CITRATE (PF) 250 MCG/5ML IJ SOLN
INTRAMUSCULAR | Status: DC | PRN
  Administered 2021-02-03: 50 ug via INTRAVENOUS
  Administered 2021-02-03: 100 ug via INTRAVENOUS
  Administered 2021-02-03 (×4): 50 ug via INTRAVENOUS

## 2021-02-03 MED ORDER — 0.9 % SODIUM CHLORIDE (POUR BTL) OPTIME
TOPICAL | Status: DC | PRN
  Administered 2021-02-03: 1000 mL

## 2021-02-03 MED ORDER — PROPOFOL 10 MG/ML IV BOLUS
INTRAVENOUS | Status: DC | PRN
  Administered 2021-02-03: 160 mg via INTRAVENOUS

## 2021-02-03 MED ORDER — ACETAMINOPHEN 500 MG PO TABS
1000.0000 mg | ORAL_TABLET | ORAL | Status: AC
  Administered 2021-02-03: 1000 mg via ORAL
  Filled 2021-02-03: qty 2

## 2021-02-03 MED ORDER — LIDOCAINE 2% (20 MG/ML) 5 ML SYRINGE
INTRAMUSCULAR | Status: AC
  Filled 2021-02-03: qty 10

## 2021-02-03 MED ORDER — FENTANYL CITRATE (PF) 250 MCG/5ML IJ SOLN
INTRAMUSCULAR | Status: AC
  Filled 2021-02-03: qty 5

## 2021-02-03 MED ORDER — FENTANYL CITRATE (PF) 100 MCG/2ML IJ SOLN
25.0000 ug | INTRAMUSCULAR | Status: DC | PRN
  Administered 2021-02-03 (×2): 50 ug via INTRAVENOUS

## 2021-02-03 MED ORDER — LIDOCAINE 2% (20 MG/ML) 5 ML SYRINGE
INTRAMUSCULAR | Status: DC | PRN
  Administered 2021-02-03: 100 mg via INTRAVENOUS

## 2021-02-03 MED ORDER — ONDANSETRON HCL 4 MG/2ML IJ SOLN
INTRAMUSCULAR | Status: AC
  Filled 2021-02-03: qty 6

## 2021-02-03 MED ORDER — ONDANSETRON HCL 4 MG/2ML IJ SOLN: 4.0000 mg | Freq: Once | INTRAMUSCULAR | Status: DC | PRN

## 2021-02-03 MED ORDER — FENTANYL CITRATE (PF) 100 MCG/2ML IJ SOLN
INTRAMUSCULAR | Status: AC
  Filled 2021-02-03: qty 2

## 2021-02-03 MED ORDER — TRAMADOL HCL 50 MG PO TABS: 50.0000 mg | ORAL_TABLET | Freq: Four times a day (QID) | ORAL | 0 refills | Status: DC | PRN

## 2021-02-03 MED ORDER — ONDANSETRON HCL 4 MG/2ML IJ SOLN
INTRAMUSCULAR | Status: DC | PRN
  Administered 2021-02-03: 4 mg via INTRAVENOUS

## 2021-02-03 MED ORDER — METOPROLOL TARTRATE 5 MG/5ML IV SOLN
INTRAVENOUS | Status: DC | PRN
  Administered 2021-02-03 (×2): 2 mg via INTRAVENOUS

## 2021-02-03 MED ORDER — ESMOLOL HCL 100 MG/10ML IV SOLN
INTRAVENOUS | Status: AC
  Filled 2021-02-03: qty 10

## 2021-02-03 MED ORDER — PHENYLEPHRINE 40 MCG/ML (10ML) SYRINGE FOR IV PUSH (FOR BLOOD PRESSURE SUPPORT)
PREFILLED_SYRINGE | INTRAVENOUS | Status: AC
  Filled 2021-02-03: qty 20

## 2021-02-03 MED ORDER — LACTATED RINGERS IV SOLN: INTRAVENOUS | Status: DC

## 2021-02-03 MED ORDER — BUPIVACAINE HCL (PF) 0.25 % IJ SOLN
INTRAMUSCULAR | Status: AC
  Filled 2021-02-03: qty 30

## 2021-02-03 MED ORDER — PROPOFOL 10 MG/ML IV BOLUS
INTRAVENOUS | Status: AC
  Filled 2021-02-03: qty 20

## 2021-02-03 MED ORDER — METOPROLOL TARTRATE 5 MG/5ML IV SOLN
INTRAVENOUS | Status: AC
  Filled 2021-02-03: qty 5

## 2021-02-03 MED ORDER — CEFAZOLIN SODIUM-DEXTROSE 2-3 GM-%(50ML) IV SOLR
INTRAVENOUS | Status: DC | PRN
  Administered 2021-02-03: 2 g via INTRAVENOUS

## 2021-02-03 MED ORDER — DEXAMETHASONE SODIUM PHOSPHATE 10 MG/ML IJ SOLN
INTRAMUSCULAR | Status: AC
  Filled 2021-02-03: qty 3

## 2021-02-03 MED ORDER — CEFAZOLIN SODIUM-DEXTROSE 2-4 GM/100ML-% IV SOLN
2.0000 g | INTRAVENOUS | Status: AC
  Administered 2021-02-03: 2 g via INTRAVENOUS
  Filled 2021-02-03: qty 100

## 2021-02-03 MED ORDER — ROCURONIUM BROMIDE 10 MG/ML (PF) SYRINGE
PREFILLED_SYRINGE | INTRAVENOUS | Status: AC
  Filled 2021-02-03: qty 40

## 2021-02-03 MED ORDER — DEXAMETHASONE SODIUM PHOSPHATE 10 MG/ML IJ SOLN
INTRAMUSCULAR | Status: DC | PRN
  Administered 2021-02-03: 4 mg via INTRAVENOUS

## 2021-02-03 MED ORDER — BUPIVACAINE HCL 0.25 % IJ SOLN
INTRAMUSCULAR | Status: DC | PRN
  Administered 2021-02-03: 5 mL

## 2021-02-03 MED ORDER — CHLORHEXIDINE GLUCONATE 0.12 % MT SOLN
15.0000 mL | Freq: Once | OROMUCOSAL | Status: AC
  Administered 2021-02-03: 15 mL via OROMUCOSAL
  Filled 2021-02-03: qty 15

## 2021-02-03 MED ORDER — ROCURONIUM BROMIDE 10 MG/ML (PF) SYRINGE
PREFILLED_SYRINGE | INTRAVENOUS | Status: DC | PRN
  Administered 2021-02-03: 60 mg via INTRAVENOUS

## 2021-02-03 MED ORDER — SUGAMMADEX SODIUM 200 MG/2ML IV SOLN
INTRAVENOUS | Status: DC | PRN
  Administered 2021-02-03: 200 mg via INTRAVENOUS

## 2021-02-03 MED ORDER — ORAL CARE MOUTH RINSE: 15.0000 mL | Freq: Once | OROMUCOSAL | Status: AC

## 2021-02-03 SURGICAL SUPPLY — 48 items
ADH SKN CLS APL DERMABOND .7 (GAUZE/BANDAGES/DRESSINGS) ×1
APL PRP STRL LF DISP 70% ISPRP (MISCELLANEOUS) ×1
APPLIER CLIP 5 13 M/L LIGAMAX5 (MISCELLANEOUS)
APR CLP MED LRG 5 ANG JAW (MISCELLANEOUS)
BINDER ABDOMINAL 12 XL 75-84 (SOFTGOODS) ×3 IMPLANT
CANISTER SUCT 3000ML PPV (MISCELLANEOUS) IMPLANT
CHLORAPREP W/TINT 26 (MISCELLANEOUS) ×3 IMPLANT
CLIP APPLIE 5 13 M/L LIGAMAX5 (MISCELLANEOUS) IMPLANT
COVER SURGICAL LIGHT HANDLE (MISCELLANEOUS) ×3 IMPLANT
DERMABOND ADVANCED (GAUZE/BANDAGES/DRESSINGS) ×2
DERMABOND ADVANCED .7 DNX12 (GAUZE/BANDAGES/DRESSINGS) ×1 IMPLANT
DEVICE SECURE STRAP 25 ABSORB (INSTRUMENTS) ×6 IMPLANT
DEVICE TROCAR PUNCTURE CLOSURE (ENDOMECHANICALS) ×3 IMPLANT
ELECT REM PT RETURN 9FT ADLT (ELECTROSURGICAL) ×3
ELECTRODE REM PT RTRN 9FT ADLT (ELECTROSURGICAL) ×1 IMPLANT
GLOVE SURG ENC MOIS LTX SZ7.5 (GLOVE) ×3 IMPLANT
GOWN STRL REUS W/ TWL LRG LVL3 (GOWN DISPOSABLE) ×2 IMPLANT
GOWN STRL REUS W/ TWL XL LVL3 (GOWN DISPOSABLE) ×2 IMPLANT
GOWN STRL REUS W/TWL 2XL LVL3 (GOWN DISPOSABLE) ×3 IMPLANT
GOWN STRL REUS W/TWL LRG LVL3 (GOWN DISPOSABLE) ×6
GOWN STRL REUS W/TWL XL LVL3 (GOWN DISPOSABLE) ×6
KIT BASIN OR (CUSTOM PROCEDURE TRAY) ×3 IMPLANT
KIT TURNOVER KIT B (KITS) ×3 IMPLANT
MESH VENTRALIGHT ST 6IN CRC (Mesh General) ×3 IMPLANT
NEEDLE INSUFFLATION 14GA 120MM (NEEDLE) ×3 IMPLANT
NS IRRIG 1000ML POUR BTL (IV SOLUTION) ×3 IMPLANT
PAD ARMBOARD 7.5X6 YLW CONV (MISCELLANEOUS) ×6 IMPLANT
POUCH LAPAROSCOPIC INSTRUMENT (MISCELLANEOUS) ×3 IMPLANT
SCISSORS LAP 5X35 DISP (ENDOMECHANICALS) ×3 IMPLANT
SET IRRIG TUBING LAPAROSCOPIC (IRRIGATION / IRRIGATOR) ×3 IMPLANT
SET TUBE SMOKE EVAC HIGH FLOW (TUBING) ×3 IMPLANT
SHEARS HARMONIC ACE PLUS 36CM (ENDOMECHANICALS) IMPLANT
SLEEVE ENDOPATH XCEL 5M (ENDOMECHANICALS) ×3 IMPLANT
SUT CHROMIC 2 0 SH (SUTURE) ×3 IMPLANT
SUT ETHIBOND 0 MO6 C/R (SUTURE) ×3 IMPLANT
SUT MNCRL AB 4-0 PS2 18 (SUTURE) ×3 IMPLANT
SUT NOVA 1 T20/GS 25DT (SUTURE) IMPLANT
SUT PROLENE 2 0 KS (SUTURE) IMPLANT
SYR BULB IRRIG 60ML STRL (SYRINGE) ×3 IMPLANT
TOWEL GREEN STERILE (TOWEL DISPOSABLE) ×3 IMPLANT
TOWEL GREEN STERILE FF (TOWEL DISPOSABLE) ×3 IMPLANT
TRAY FOLEY W/BAG SLVR 16FR (SET/KITS/TRAYS/PACK)
TRAY FOLEY W/BAG SLVR 16FR ST (SET/KITS/TRAYS/PACK) IMPLANT
TRAY LAPAROSCOPIC MC (CUSTOM PROCEDURE TRAY) ×3 IMPLANT
TROCAR XCEL NON-BLD 11X100MML (ENDOMECHANICALS) ×3 IMPLANT
TROCAR XCEL NON-BLD 5MMX100MML (ENDOMECHANICALS) ×3 IMPLANT
WARMER LAPAROSCOPE (MISCELLANEOUS) ×3 IMPLANT
WATER STERILE IRR 1000ML POUR (IV SOLUTION) ×3 IMPLANT

## 2021-02-03 NOTE — Interval H&P Note (Signed)
History and Physical Interval Note:  02/03/2021 11:10 AM  Wesley [name]  has presented today for surgery, with the diagnosis of ventral hernia.  The various methods of treatment have been discussed with the patient and family. After consideration of risks, benefits and other options for treatment, the patient has consented to  Procedure(s): [place] (N/A) as a surgical intervention.  The patient's history has been reviewed, patient examined, no change in status, stable for surgery.  I have reviewed the patient's chart and labs.  Questions were answered to the patient's satisfaction.     [name]

## 2021-02-03 NOTE — Interval H&P Note (Signed)
History and Physical Interval Note:  02/03/2021 11:13 AM  Wesley [name]  has presented today for surgery, with the diagnosis of ventral hernia.  The various methods of treatment have been discussed with the patient and family. After consideration of risks, benefits and other options for treatment, the patient has consented to  Procedure(s): [place] (N/A) as a surgical intervention.  The patient's history has been reviewed, patient examined, no change in status, stable for surgery.  I have reviewed the patient's chart and labs.  Questions were answered to the patient's satisfaction.     [name]

## 2021-02-03 NOTE — Transfer of Care (Signed)
Immediate Anesthesia Transfer of Care Note  Patient: Wesley [name]  Procedure(s) Performed: LAPAROSCOPIC VENTRAL HERNIA REPAIR WITH MESH  Patient Location: PACU  Anesthesia Type:General  Level of Consciousness: drowsy, patient cooperative and responds to stimulation  Airway & Oxygen Therapy: Patient Spontanous Breathing  Post-op Assessment: Report given to RN and Post -op Vital signs reviewed and stable  Post vital signs: Reviewed and stable  Last Vitals:  Vitals Value Taken Time  BP 148/113 02/03/21 1310  Temp    Pulse 79 02/03/21 1310  Resp 21 02/03/21 1310  SpO2 86 % 02/03/21 1310  Vitals shown include unvalidated device data.  Last Pain:  Vitals:   02/03/21 1032  TempSrc:   PainSc: 0-No pain         Complications: No notable events documented.

## 2021-02-03 NOTE — Op Note (Signed)
02/03/2021  12:51 PM  PATIENT:  Wesley [name]  80 y.o. male  PRE-OPERATIVE DIAGNOSIS:  ventral hernia  POST-OPERATIVE DIAGNOSIS:  ventral hernia  PROCEDURE:  Procedure(s): LAPAROSCOPIC VENTRAL HERNIA REPAIR WITH MESH (N/A)  SURGEON:  Surgeon(s) and Role:    * [name], MD - Primary   ASSISTANTS: Ria Clock Anderson,RNFA   ANESTHESIA:   local and general  EBL:  10 mL   BLOOD ADMINISTERED:none  DRAINS: none   LOCAL MEDICATIONS USED:  BUPIVICAINE   SPECIMEN:  No Specimen  DISPOSITION OF SPECIMEN:  N/A  COUNTS:  YES  TOURNIQUET:  * No tourniquets in log *  DICTATION: .Dragon Dictation  Details of the procedure:   After the patient was consented patient was taken back to the operating room patient was then placed in supine position bilateral SCDs in place.  The patient was prepped and draped in the usual sterile fashion. After antibiotics were confirmed a timeout was called and all facts were verified. The Veress needle technique was used to insuflate the abdomen at right subcostal margin. The abdomen was insufflated to 14 mm mercury. Subsequently a 5 mm trocar was placed a camera inserted there was no injury to any intra-abdominal organs.    There was seen to be a 3cm non-incarcerated  ventral epigastric hernia.  A second camera port was in placed into the right lower quadrant.   At this the Falicform ligament was taken down with Bovie cautery maintaining hemostasis.    I proceeded to reduce the hernia contents.  The hernia sac was dissected out of the hernia and disposed.  The fascia at the hernia was reapproximated using a #0 Ethibonds x 4.  Once the hernia was cleared away, a Bard Ventralight 15cm  mesh was inserted into the abdomen.  The mesh was secured circumferentially with am Securestrap tacker in a double crown fashion.  0 vicryl and endoclose were used to reapproximate the fascia at the RUQ trocar site.  The omentum was brought over the area of the mesh. The  pneumoperitoneum was evacuated  & all trocars  were removed. The skin was reapproximated with 4-0  Monocryl sutures in a subcuticular fashion. The skin was dressed with Dermabond.  The patient was taken to the recovery room in stable condition.  Type of repair -primary suture & mesh  Mesh overlap - 5cm  Placement of mesh -  beneath fascia and into peritoneal cavity     PLAN OF CARE: Discharge to home after PACU  PATIENT DISPOSITION:  PACU - hemodynamically stable.   Delay start of Pharmacological VTE agent (>24hrs) due to surgical blood loss or risk of bleeding: not applicable

## 2021-02-03 NOTE — Anesthesia Postprocedure Evaluation (Signed)
Anesthesia Post Note  Patient: GAGAN DILLION  Procedure(s) Performed: [place] WITH MESH     Patient location during evaluation: PACU Anesthesia Type: General Level of consciousness: awake and alert Pain management: pain level controlled Vital Signs Assessment: post-procedure vital signs reviewed and stable Respiratory status: spontaneous breathing, nonlabored ventilation, respiratory function stable and patient connected to nasal cannula oxygen Cardiovascular status: blood pressure returned to baseline and stable Postop Assessment: no apparent nausea or vomiting Anesthetic complications: no   No notable events documented.  Last Vitals:  Vitals:   02/03/21 1400 02/03/21 1413  BP: (!) 151/96 (!) 139/91  Pulse: 72 68  Resp: 17 17  Temp:  36.8 C  SpO2: 97% 97%    Last Pain:  Vitals:   02/03/21 1032  TempSrc:   PainSc: 0-No pain                 Catalina Gravel

## 2021-02-03 NOTE — Discharge Instructions (Signed)
[facility]  HERNIA REPAIR: POST OP INSTRUCTIONS  Always review your discharge instruction sheet given to you by the facility where your surgery was performed. IF YOU HAVE DISABILITY OR FAMILY LEAVE FORMS, YOU MUST BRING THEM TO THE OFFICE FOR PROCESSING.   DO NOT GIVE THEM TO YOUR DOCTOR.  1. A  prescription for pain medication may be given to you upon discharge.  Take your pain medication as prescribed, if needed.  If narcotic pain medicine is not needed, then you may take acetaminophen (Tylenol) or ibuprofen (Advil) as needed. 2. Take your usually prescribed medications unless otherwise directed. If you need a refill on your pain medication, please contact your pharmacy.  They will contact our office to request authorization. Prescriptions will not be filled after 5 pm or on week-ends. 3. You should follow a light diet the first 24 hours after arrival home, such as soup and crackers, etc.  Be sure to include lots of fluids daily.  Resume your normal diet the day after surgery. 4.Most patients will experience some swelling and bruising around the umbilicus or in the groin and scrotum.  Ice packs and reclining will help.  Swelling and bruising can take several days to resolve.  6. It is common to experience some constipation if taking pain medication after surgery.  Increasing fluid intake and taking a stool softener (such as Colace) will usually help or prevent this problem from occurring.  A mild laxative (Milk of Magnesia or Miralax) should be taken according to package directions if there are no bowel movements after 48 hours. 7. Unless discharge instructions indicate otherwise, you may remove your bandages 24-48 hours after surgery, and you may shower at that time.  You may have steri-strips (small skin tapes) in place directly over the incision.  These strips should be left on the skin for 7-10 days.  If your surgeon used skin glue on the incision, you may shower in  24 hours.  The glue will flake off over the next 2-3 weeks.  Any sutures or staples will be removed at the office during your follow-up visit. 8. ACTIVITIES:  You may resume regular (light) daily activities beginning the next day--such as daily self-care, walking, climbing stairs--gradually increasing activities as tolerated.  You may have sexual intercourse when it is comfortable.  Refrain from any heavy lifting or straining until approved by your doctor.  a.You may drive when you are no longer taking prescription pain medication, you can comfortably wear a seatbelt, and you can safely maneuver your car and apply brakes. b.RETURN TO WORK:   _____________________________________________  9.You should see your doctor in the office for a follow-up appointment approximately 2-3 weeks after your surgery.  Make sure that you call for this appointment within a day or two after you arrive home to insure a convenient appointment time. 10.OTHER INSTRUCTIONS: _________________________    _____________________________________  WHEN TO CALL YOUR DOCTOR: Fever over 101.0 Inability to urinate Nausea and/or vomiting Extreme swelling or bruising Continued bleeding from incision. Increased pain, redness, or drainage from the incision  The clinic staff is available to answer your questions during regular business hours.  Please don't hesitate to call and ask to speak to one of the nurses for clinical concerns.  If you have a medical emergency, go to the nearest emergency room or call 911.  A surgeon from [facility] is always on call at the hospital   [address], [address], [place], [place]    [address] ?  [address] [address], [place], [place]   [address] [phone] ? [phone] ? FAX [phone] Web site: [email or web address]  

## 2021-02-03 NOTE — Anesthesia Procedure Notes (Signed)
Procedure Name: Intubation Date/Time: 02/03/2021 12:02 PM Performed by: Oletta Lamas, CRNA Pre-anesthesia Checklist: Patient identified, Emergency Drugs available, Suction available and Patient being monitored Patient Re-evaluated:Patient Re-evaluated prior to induction Oxygen Delivery Method: Circle System Utilized Preoxygenation: Pre-oxygenation with 100% oxygen Induction Type: IV induction Ventilation: Mask ventilation without difficulty Laryngoscope Size: Mac and 4 Grade View: Grade II Tube type: Oral Tube size: 7.5 mm Number of attempts: 1 Airway Equipment and Method: Stylet and Oral airway Placement Confirmation: ETT inserted through vocal cords under direct vision, positive ETCO2 and breath sounds checked- equal and bilateral Secured at: 23 cm Tube secured with: Tape Dental Injury: Teeth and Oropharynx as per pre-operative assessment

## 2021-02-04 ENCOUNTER — Telehealth: Payer: Self-pay

## 2021-02-04 ENCOUNTER — Encounter (HOSPITAL_COMMUNITY): Payer: Self-pay | Admitting: General Surgery

## 2021-02-04 DIAGNOSIS — Z8719 Personal history of other diseases of the digestive system: Secondary | ICD-10-CM

## 2021-02-04 NOTE — Telephone Encounter (Signed)
Referral has been placed. 

## 2021-02-04 NOTE — Telephone Encounter (Signed)
Pt called asking for a physical therapy referral after having surgery recently.  Please advise

## 2021-02-23 ENCOUNTER — Other Ambulatory Visit: Payer: Self-pay

## 2021-02-23 ENCOUNTER — Ambulatory Visit (INDEPENDENT_AMBULATORY_CARE_PROVIDER_SITE_OTHER): Payer: Medicare Other | Admitting: Physical Therapy

## 2021-02-23 DIAGNOSIS — R2689 Other abnormalities of gait and mobility: Secondary | ICD-10-CM | POA: Diagnosis not present

## 2021-02-23 DIAGNOSIS — M6281 Muscle weakness (generalized): Secondary | ICD-10-CM | POA: Diagnosis not present

## 2021-02-23 NOTE — Patient Instructions (Signed)
Access Code: [email or web address] URL: [email or web address] Date: 02/23/2021 Prepared by: Lyndee Hensen  Exercises Seated Heel Raise - 1 x daily - 2 sets - 10 reps Standing Weight Shift - 1 x daily - 2 sets - 10 reps Standing March with Counter Support - 1 x daily - 2 sets - 10 reps Seated Knee Extension AROM - 1 x daily - 2 sets - 10 reps Sit to Stand - 1 x daily - 1 sets - 10 reps Ankle and Toe Plantarflexion with Resistance - 1 x daily - 2 sets - 10 reps

## 2021-03-01 ENCOUNTER — Encounter: Payer: Medicare Other | Admitting: Physical Therapy

## 2021-03-03 ENCOUNTER — Encounter: Payer: Self-pay | Admitting: Family Medicine

## 2021-03-08 ENCOUNTER — Encounter: Payer: Self-pay | Admitting: Physical Therapy

## 2021-03-08 ENCOUNTER — Ambulatory Visit (INDEPENDENT_AMBULATORY_CARE_PROVIDER_SITE_OTHER): Payer: Medicare Other | Admitting: Physical Therapy

## 2021-03-08 ENCOUNTER — Other Ambulatory Visit: Payer: Self-pay

## 2021-03-08 DIAGNOSIS — M6281 Muscle weakness (generalized): Secondary | ICD-10-CM

## 2021-03-08 DIAGNOSIS — R2689 Other abnormalities of gait and mobility: Secondary | ICD-10-CM | POA: Diagnosis not present

## 2021-03-08 NOTE — Therapy (Signed)
[facility] [address] [place], [place], [address] Phone: [phone]   Fax:  [phone]  Physical Therapy Treatment  Patient Details  Name: Wesley [name] MRN: [id number] Date of Birth: August 11, 1941 Referring Provider (PT): Garret Reddish   Encounter Date: 03/08/2021   PT End of Session - 03/08/21 2036     Visit Number 2    Number of Visits 12    Date for PT Re-Evaluation 04/06/21    Authorization Type Medicare    PT Start Time 1301    PT Stop Time [id number]    PT Time Calculation (min) 44 min    Equipment Utilized During Treatment Gait belt    Activity Tolerance Patient tolerated treatment well    Behavior During Therapy WFL for tasks assessed/performed             Past Medical History:  Diagnosis Date   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 symptoms (LUTS)    CAD (coronary artery disease)    Cholelithiasis    Chronic allergic rhinitis    Coronary atherosclerosis of native coronary artery    Fatty liver    GERD (gastroesophageal reflux disease)    HX 66yr ago, no longer a problem, no meds   High blood pressure    Hypercholesterolemia    Macrocytosis without anemia    OSA on CPAP    Paraesophageal hernia    Partial bowel obstruction ([place]    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 PT denies significant findings   CARDIAC CATHETERIZATION  2008   COLOSTOMY N/A 10/31/2019   Procedure: COLOSTOMY;  Surgeon: CErroll Luna MD;  Location: [facility];  Service: General;  Laterality: N/A;   CYSTOSCOPY W/ URETERAL STENT PLACEMENT Bilateral 10/31/2019   Procedure: CYSTOSCOPY URETERAL  STENT PLACEMENT BILATERAL;  Surgeon: DFranchot Gallo MD;  Location: [facility];  Service: Urology;  Laterality: Bilateral;   CYSTOSCOPY WITH STENT PLACEMENT Bilateral 03/11/2020   Procedure: CYSTOSCOPY WITH BILATERAL FIREFLY INJECTION;  Surgeon: WIrine Seal MD;  Location: [facility];  Service: Urology;  Laterality: Bilateral;   EYE SURGERY     BILATERAL CATARACT SURGERY WITH LENS IMPLANTS   FLEXIBLE SIGMOIDOSCOPY N/A 10/29/2019   Procedure: FLEXIBLE SIGMOIDOSCOPY;  Surgeon: PJerene Bears MD;  [facility]: [facility];  Service: Gastroenterology;  Laterality: N/A;   INCISIONAL HERNIA REPAIR N/A 10/13/2020   Procedure: OPEN INCISIONAL HERNIA REPAIR WITH MESH;  Surgeon: RRalene Ok MD;  [facility]  Service: General;  Laterality: N/A;   INSERTION OF MESH N/A 09/08/2016   Procedure: INSERTION OF MESH;  Surgeon: TJackolyn Confer MD;  Location: [facility];  Service: General;  Laterality: N/A;   IR RADIOLOGIST EVAL & MGMT  11/10/2020   IR RADIOLOGIST EVAL & MGMT  11/25/2020   LAPAROSCOPIC SIGMOID COLECTOMY N/A 10/31/2019   Procedure: DIAGNOSTIC LAPAROSCOPY; EXPLORATORY LAPAROTOMY; SIGMOID COLECTOMY;  Surgeon: CErroll Luna MD;  Location: [facility];  Service: General;  Laterality: N/A;   right rotator cuff     SUBMUCOSAL TATTOO INJECTION  10/29/2019   Procedure: SUBMUCOSAL TATTOO INJECTION;  Surgeon: PJerene Bears MD;  [facility];  Service: Gastroenterology;;   TONSILLECTOMY     UMBILICAL HERNIA REPAIR N/A 09/08/2016   Procedure: OPEN UMBILICAL HERNIA REPAIR WITH MESH;  Surgeon: Jackolyn Confer, MD;  Location: [facility];  Service: General;  Laterality: N/A;   VENTRAL HERNIA REPAIR N/A 02/03/2021   Procedure: LAPAROSCOPIC VENTRAL HERNIA REPAIR WITH MESH;  Surgeon: [name], MD;  Location: [facility];  Service: General;  Laterality: N/A;   XI ROBOTIC ASSISTED COLOSTOMY TAKEDOWN N/A 03/11/2020   Procedure: ROBOTIC ASSISTED COLOSTOMY REVERSAL, RIGID PROCTOSCOPY;  Surgeon: Leighton Ruff, MD;   Location: [facility];  Service: General;  Laterality: N/A;    There were no vitals filed for this visit.   Subjective Assessment - 03/08/21 2035     Subjective Pt has been doing HEP. No new complaints.    Currently in Pain? No/denies    Pain Score 0-No pain                               [facility] Adult PT Treatment/Exercise - 03/08/21 2042       Exercises   Exercises Knee/Hip      Knee/Hip Exercises: Aerobic   Recumbent Bike L2  x 6  min;      Knee/Hip Exercises: Standing   Hip Flexion 20 reps;Knee bent    Forward Step Up 10 reps;Hand Hold: 1;Step Height: 6";Both    Other Standing Knee Exercises L/R and A/P weight shifts, head turns, torso turns, all x 15 ea bil;      Knee/Hip Exercises: Seated   Long Arc Quad Both;15 reps    Long Arc Quad Weight 3 lbs.    Long [employer] Limitations education on form (for back)    Other Seated Knee/Hip Exercises reviewed for HEP    Sit to Sand 10 reps;without UE support   regular arm chair                     PT Short Term Goals - 03/08/21 1545       PT SHORT TERM GOAL #1   Title Pt to be independent wtih initial HEP    Time 2    Period Weeks    Status New    Target Date 03/09/21      PT SHORT TERM GOAL #2   Title Pt to demo ability for sit to stand x 10 without use of UE support from regular chair.    Time 3    Period Weeks    Status New    Target Date 03/16/21               PT Long Term Goals - 03/08/21 1547       PT LONG TERM GOAL #1   Title Pt to be independent with final HEP    Time 6    Period Weeks    Status New    Target Date 04/06/21      PT LONG TERM GOAL #2   Title Pt to demo improved strength of hips and knees to 5/5 to improve stability, endurance, and ability for stair climbing.    Time 6    Period Weeks    Status New    Target Date 04/06/21      PT LONG TERM GOAL #3   Title Pt to demo improved score on BERG by at least 5 points    Time 6    Period Weeks    Status  New    Target Date 04/06/21      PT LONG TERM GOAL #4   Title Pt to demo improved score of 5 time sit to stand to under 12 sec.    Time 6    Period Weeks    Status New    Target Date 04/06/21                   Plan - 03/08/21 2037     Clinical Impression Statement Reviewd HEP for correct mechanics with exercises. Pt did well with progression of strength and stability today. Pt challenged wtih balance exercises , will continue to progress as tolerated.    Personal Factors and Comorbidities Fitness;Comorbidity 1    Comorbidities recent surgery with complications    Examination-Activity Limitations Lift;Stand;Locomotion Level;Bend;Carry;Squat;Stairs    Examination-Participation Restrictions Cleaning;Yard Work;Community Activity;Shop    Stability/Clinical Decision Making Stable/Uncomplicated    Rehab Potential Good    PT Frequency 2x / week    PT Duration 6 weeks    PT Treatment/Interventions ADLs/Self Care Home Management;Canalith Repostioning;Cryotherapy;Electrical Stimulation;Iontophoresis '4mg'$ /ml Dexamethasone;Moist Heat;DME Instruction;Gait training;Stair training;Functional mobility training;Therapeutic activities;Therapeutic exercise;Balance training;Neuromuscular re-education;Patient/family education;Orthotic Fit/Training;Manual techniques;Passive range of motion;Energy conservation;Taping;Splinting;Spinal Manipulations;Joint Manipulations    PT Home Exercise Plan [email or web address]    Consulted and Agree with Plan of Care Patient             Patient will benefit from skilled therapeutic intervention in order to improve the following deficits and impairments:  Abnormal gait, Pain, Decreased mobility, Decreased coordination, Decreased strength, Decreased endurance, Decreased activity tolerance, Decreased safety awareness, Decreased balance  Visit Diagnosis: Muscle weakness (generalized)  Other abnormalities of gait and mobility     Problem List Patient Active Problem List    Diagnosis Date Noted   B12 deficiency 01/06/2021   Vitamin D deficiency 01/06/2021   Aortic atherosclerosis ([place]) 01/06/2021   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 [place], [place], [address] Phone: [phone]   Fax:  [phone]  Name: Wesley [name] MRN: [id number] Date of Birth: June 24, 1941

## 2021-03-08 NOTE — Therapy (Signed)
[facility] [address] [place], [place], [address] Phone: [phone]   Fax:  [phone]  Physical Therapy Evaluation  Patient Details  Name: Wesley [name] MRN: [id number] Date of Birth: Jan 09, 1941 Referring Provider (PT): Garret Reddish   Encounter Date: 02/23/2021   PT End of Session - 03/08/21 1521     Visit Number 1    Number of Visits 12    Date for PT Re-Evaluation 04/06/21    Authorization Type Medicare    PT Start Time 0930    PT Stop Time 1013    PT Time Calculation (min) 43 min    Equipment Utilized During Treatment Gait belt    Activity Tolerance Patient tolerated treatment well    Behavior During Therapy WFL for tasks assessed/performed             Past Medical History:  Diagnosis Date   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 symptoms (LUTS)    CAD (coronary artery disease)    Cholelithiasis    Chronic allergic rhinitis    Coronary atherosclerosis of native coronary artery    Fatty liver    GERD (gastroesophageal reflux disease)    HX 56yr ago, no longer a problem, no meds   High blood pressure    Hypercholesterolemia    Macrocytosis without anemia    OSA on CPAP    Paraesophageal hernia    Partial bowel obstruction ([place]    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 PT denies significant findings   CARDIAC CATHETERIZATION  2008   COLOSTOMY N/A 10/31/2019   Procedure: COLOSTOMY;  Surgeon: CErroll Luna MD;  [facility];  Service: General;  Laterality: N/A;   CYSTOSCOPY W/ URETERAL STENT PLACEMENT Bilateral 10/31/2019   Procedure: CYSTOSCOPY  URETERAL STENT PLACEMENT BILATERAL;  Surgeon: DFranchot Gallo MD;  [facility];  Service: Urology;  Laterality: Bilateral;   CYSTOSCOPY WITH STENT PLACEMENT Bilateral 03/11/2020   Procedure: CYSTOSCOPY WITH BILATERAL FIREFLY INJECTION;  Surgeon: WIrine Seal MD;  [facility];  Service: Urology;  Laterality: Bilateral;   EYE SURGERY     BILATERAL CATARACT SURGERY WITH LENS IMPLANTS   FLEXIBLE SIGMOIDOSCOPY N/A 10/29/2019   Procedure: FLEXIBLE SIGMOIDOSCOPY;  Surgeon: PJerene Bears MD;  [facility];  Service: Gastroenterology;  Laterality: N/A;   INCISIONAL HERNIA REPAIR N/A 10/13/2020   Procedure: OPEN INCISIONAL HERNIA REPAIR WITH MESH;  Surgeon: RRalene Ok MD;  [facility]  Service: General;  Laterality: N/A;   INSERTION OF MESH N/A 09/08/2016   Procedure: INSERTION OF MESH;  Surgeon: TJackolyn Confer MD;  [facility];  Service: General;  Laterality: N/A;   IR RADIOLOGIST EVAL & MGMT  11/10/2020   IR RADIOLOGIST EVAL & MGMT  11/25/2020   LAPAROSCOPIC SIGMOID COLECTOMY N/A 10/31/2019   Procedure: DIAGNOSTIC LAPAROSCOPY; EXPLORATORY LAPAROTOMY; SIGMOID COLECTOMY;  Surgeon: CErroll Luna MD;  [facility];  Service: General;  Laterality: N/A;   right rotator cuff     SUBMUCOSAL TATTOO INJECTION  10/29/2019   Procedure: SUBMUCOSAL TATTOO INJECTION;  Surgeon: PJerene Bears MD;  [facility];  Service: Gastroenterology;;   TONSILLECTOMY     UMBILICAL HERNIA REPAIR N/A 09/08/2016   Procedure: OPEN UMBILICAL HERNIA REPAIR WITH MESH;  Surgeon: Jackolyn Confer, MD;  [facility];  Service: General;  Laterality: N/A;   VENTRAL HERNIA REPAIR N/A 02/03/2021   Procedure: LAPAROSCOPIC VENTRAL HERNIA REPAIR WITH MESH;  Surgeon: [name], MD;  Location: [facility];  Service: General;  Laterality: N/A;   XI ROBOTIC ASSISTED COLOSTOMY TAKEDOWN N/A 03/11/2020   Procedure: ROBOTIC ASSISTED COLOSTOMY REVERSAL, RIGID PROCTOSCOPY;  Surgeon: Leighton Ruff, MD;   [facility];  Service: General;  Laterality: N/A;    There were no vitals filed for this visit.    Subjective Assessment - 03/08/21 1520     Subjective Pt states history of medical complications in the last year, since March 2021, blockage, colostomy, reversal, hernia operation with mesh, abcess/infection. States he feels very weak now, after having medical issues and being in hospital. He is seeing surgeon for follow up tomorrow. Currently has a 10lb lifting restriction. States decreased endurance with walking, feels he is "wobbling",has not fallen. He has stairs but does not have to go up, also states diffiuclty with this. No pain, does drive. Interested in starting to exercise again, and main focus to get stronger.    Limitations Lifting;Standing;Walking;House hold activities    Patient Stated Goals increase physical activity, strengthening,    Currently in Pain? No/denies    Pain Score 0-No pain                [facility] PT Assessment - 03/08/21 0001       Assessment   Medical Diagnosis Weakness s/p hernia repair    Referring Provider (PT) Garret Reddish    Prior Therapy no      Precautions   Precautions Other (comment)    Precaution Comments Hernia repair 02/03/21      Balance Screen   Has the patient fallen in the past 6 months No      Prior Function   Level of Independence Independent      Cognition   Overall Cognitive Status Within Functional Limits for tasks assessed      ROM / Strength   AROM / PROM / Strength AROM;Strength      AROM   Overall AROM Comments AROM: WFL      Strength   Overall Strength Comments Strength: UE: 4/5, Hips: 4/5, Knees: 4/5;   Ankle PF: sigificant weakness, unable to do standing heel raise (on 2 feet)      Transfers   Five time sit to stand comments  12.37 sec.      Standardized Balance Assessment   Standardized Balance Assessment Berg Balance Test      Berg Balance Test   Sit to Stand Able to stand  independently using  hands    Standing Unsupported Able to stand safely 2 minutes    Sitting with Back Unsupported but Feet Supported on Floor or Stool Able to sit safely and securely 2 minutes    Stand to Sit Sits safely with minimal use of hands    Transfers Able to transfer safely, minor use of hands    Standing Unsupported with Eyes Closed Able to stand 10 seconds safely    Standing Unsupported with Feet Together Able to place feet together independently but unable to hold for 30 seconds    From Standing, Reach Forward with Outstretched Arm Can reach forward >12 cm safely (5")    From Standing Position, Pick  up Object from [employer] to pick up shoe safely and easily    From Standing Position, Turn to Look Behind Over each Shoulder Looks behind one side only/other side shows less weight shift    Turn 360 Degrees Able to turn 360 degrees safely but slowly    Standing Unsupported, Alternately Place Feet on Step/Stool Able to complete 4 steps without aid or supervision    Standing Unsupported, One Foot in Front Needs help to step but can hold 15 seconds    Standing on One Leg Tries to lift leg/unable to hold 3 seconds but remains standing independently    Total Score 41                        Objective measurements completed on examination: See above findings.       [facility] Adult PT Treatment/Exercise - 03/08/21 1537       Knee/Hip Exercises: Standing   Other Standing Knee Exercises L/R weight shifts x 20;  Standing march x 20;      Knee/Hip Exercises: Seated   Long Arc Quad Both;15 reps    Other Seated Knee/Hip Exercises Heel raises x 20, Ankle PF RTB x 15;    Sit to Sand 5 reps;without UE support                    PT Education - 03/08/21 1521     Education Details PT POC, Exam findings, HEP    Person(s) Educated Patient    Methods Explanation;Demonstration;Tactile cues;Verbal cues;Handout    Comprehension Verbalized understanding;Returned demonstration;Verbal cues  required;Tactile cues required;Need further instruction              PT Short Term Goals - 03/08/21 1545       PT SHORT TERM GOAL #1   Title Pt to be independent wtih initial HEP    Time 2    Period Weeks    Status New    Target Date 03/09/21      PT SHORT TERM GOAL #2   Title Pt to demo ability for sit to stand x 10 without use of UE support from regular chair.    Time 3    Period Weeks    Status New    Target Date 03/16/21               PT Long Term Goals - 03/08/21 1547       PT LONG TERM GOAL #1   Title Pt to be independent with final HEP    Time 6    Period Weeks    Status New    Target Date 04/06/21      PT LONG TERM GOAL #2   Title Pt to demo improved strength of hips and knees to 5/5 to improve stability, endurance, and ability for stair climbing.    Time 6    Period Weeks    Status New    Target Date 04/06/21      PT LONG TERM GOAL #3   Title Pt to demo improved score on BERG by at least 5 points    Time 6    Period Weeks    Status New    Target Date 04/06/21      PT LONG TERM GOAL #4   Title Pt to demo improved score of 5 time sit to stand to under 12 sec.    Time 6    Period Weeks  Status New    Target Date 04/06/21                    Plan - 03/08/21 1525     Clinical Impression Statement Pt presents with primary complaints of weakness after long duration of medical issues in the last year. Pt with decreased strength in hips, knees, with decreased ability for functional activity, stairs and IADLS.  He has significant weakness in R ankle vs L, with plantarflexion, unsure of cause. He has decreased stability and balance, and foot weakness is also contributing to this. Pt with decreased endurance of activity due to being deconditioned, and has difficulty sustaining standing activity. He will benefit from education on HEP for improving functional capacity and ability for exercise. Pt to benefit from skilled PT to improve deficits.     Personal Factors and Comorbidities Fitness;Comorbidity 1    Comorbidities recent surgery with complications    Examination-Activity Limitations Lift;Stand;Locomotion Level;Bend;Carry;Squat;Stairs    Examination-Participation Restrictions Cleaning;Yard Work;Community Activity;Shop    Stability/Clinical Decision Making Stable/Uncomplicated    Clinical Decision Making Low    Rehab Potential Good    PT Frequency 2x / week    PT Duration 6 weeks    PT Treatment/Interventions ADLs/Self Care Home Management;Canalith Repostioning;Cryotherapy;Electrical Stimulation;Iontophoresis '4mg'$ /ml Dexamethasone;Moist Heat;DME Instruction;Gait training;Stair training;Functional mobility training;Therapeutic activities;Therapeutic exercise;Balance training;Neuromuscular re-education;Patient/family education;Orthotic Fit/Training;Manual techniques;Passive range of motion;Energy conservation;Taping;Splinting;Spinal Manipulations;Joint Manipulations    PT Home Exercise Plan [id number]    Consulted and Agree with Plan of Care Patient             Patient will benefit from skilled therapeutic intervention in order to improve the following deficits and impairments:  Abnormal gait, Pain, Decreased mobility, Decreased coordination, Decreased strength, Decreased endurance, Decreased activity tolerance, Decreased safety awareness, Decreased balance  Visit Diagnosis: Muscle weakness (generalized)  Other abnormalities of gait and mobility     Problem List Patient Active Problem List   Diagnosis Date Noted   B12 deficiency 01/06/2021   Vitamin D deficiency 01/06/2021   Aortic atherosclerosis ([place]) 01/06/2021   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[place]) 01/06/2021   Allergic rhinitis 01/06/2021   BPH associated with nocturia 01/06/2021   Abdominal wall abscess 10/28/2020   S/P hernia repair 10/13/2020   Osteoarthritis of glenohumeral joint, left 09/21/2017   Non-traumatic rotator cuff tear 09/21/2017   CAD  (coronary artery disease) 08/12/2013   Essential hypertension 03/15/2011   GERD (gastroesophageal reflux disease) 03/15/2011    Lyndee Hensen, PT, DPT 3:53 PM  03/08/21    [facility] [address] [place], [place], [address] Phone: [phone]   Fax:  [phone]  Name: Wesley [name] MRN: [id number] Date of Birth: [birth year]

## 2021-03-10 ENCOUNTER — Encounter: Payer: Medicare Other | Admitting: Physical Therapy

## 2021-03-11 ENCOUNTER — Other Ambulatory Visit: Payer: Self-pay

## 2021-03-11 ENCOUNTER — Ambulatory Visit (INDEPENDENT_AMBULATORY_CARE_PROVIDER_SITE_OTHER): Payer: Medicare Other | Admitting: Physical Therapy

## 2021-03-11 DIAGNOSIS — M6281 Muscle weakness (generalized): Secondary | ICD-10-CM

## 2021-03-11 DIAGNOSIS — R2689 Other abnormalities of gait and mobility: Secondary | ICD-10-CM

## 2021-03-11 NOTE — Therapy (Signed)
[facility] [address] [place], [place], [address] Phone: [phone]   Fax:  [phone]  Physical Therapy Treatment  Patient Details  Name: Wesley [name] MRN: [id number] Date of Birth: 02/01/1941 Referring Provider (PT): Garret Reddish   Encounter Date: 03/11/2021   PT End of Session - 03/11/21 1227     Visit Number 3    Number of Visits 12    Date for PT Re-Evaluation 04/06/21    Authorization Type Medicare    PT Start Time 1158    PT Stop Time 1225    PT Time Calculation (min) 27 min    Equipment Utilized During Treatment Gait belt    Activity Tolerance Patient tolerated treatment well    Behavior During Therapy WFL for tasks assessed/performed             Past Medical History:  Diagnosis Date   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 symptoms (LUTS)    CAD (coronary artery disease)    Cholelithiasis    Chronic allergic rhinitis    Coronary atherosclerosis of native coronary artery    Fatty liver    GERD (gastroesophageal reflux disease)    HX 72yr ago, no longer a problem, no meds   High blood pressure    Hypercholesterolemia    Macrocytosis without anemia    OSA on CPAP    Paraesophageal hernia    [place] bowel obstruction ([place]    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 PT denies significant findings   CARDIAC CATHETERIZATION  2008   COLOSTOMY N/A 10/31/2019   Procedure: COLOSTOMY;  Surgeon: CErroll Luna MD;  [facility];  Service: General;  Laterality: N/A;   CYSTOSCOPY W/ URETERAL STENT PLACEMENT Bilateral 10/31/2019   Procedure: CYSTOSCOPY URETERAL  STENT PLACEMENT BILATERAL;  Surgeon: DFranchot Gallo MD;  [facility];  Service: Urology;  Laterality: Bilateral;   CYSTOSCOPY WITH STENT PLACEMENT Bilateral 03/11/2020   Procedure: CYSTOSCOPY WITH BILATERAL FIREFLY INJECTION;  Surgeon: WIrine Seal MD;  [facility];  Service: Urology;  Laterality: Bilateral;   EYE SURGERY     BILATERAL CATARACT SURGERY WITH LENS IMPLANTS   FLEXIBLE SIGMOIDOSCOPY N/A 10/29/2019   Procedure: FLEXIBLE SIGMOIDOSCOPY;  Surgeon: PJerene Bears MD;  [facility];  Service: Gastroenterology;  Laterality: N/A;   INCISIONAL HERNIA REPAIR N/A 10/13/2020   Procedure: OPEN INCISIONAL HERNIA REPAIR WITH MESH;  Surgeon: RRalene Ok MD;  [facility]  Service: General;  Laterality: N/A;   INSERTION OF MESH N/A 09/08/2016   Procedure: INSERTION OF MESH;  Surgeon: TJackolyn Confer MD;  [facility];  Service: General;  Laterality: N/A;   IR RADIOLOGIST EVAL & MGMT  11/10/2020   IR RADIOLOGIST EVAL & MGMT  11/25/2020   LAPAROSCOPIC SIGMOID COLECTOMY N/A 10/31/2019   Procedure: DIAGNOSTIC LAPAROSCOPY; EXPLORATORY LAPAROTOMY; SIGMOID COLECTOMY;  Surgeon: CErroll Luna MD;  [facility];  Service: General;  Laterality: N/A;   right rotator cuff     SUBMUCOSAL TATTOO INJECTION  10/29/2019   Procedure: SUBMUCOSAL TATTOO INJECTION;  Surgeon: PJerene Bears MD;  [facility];  Service: Gastroenterology;;   TONSILLECTOMY     UMBILICAL HERNIA REPAIR N/A 09/08/2016   Procedure: OPEN UMBILICAL HERNIA REPAIR WITH MESH;  Surgeon: Jackolyn Confer, MD;  [facility];  Service: General;  Laterality: N/A;   VENTRAL HERNIA REPAIR N/A 02/03/2021   Procedure: LAPAROSCOPIC VENTRAL HERNIA REPAIR WITH MESH;  Surgeon: [name], MD;  Location: [facility];  Service: General;  Laterality: N/A;   XI ROBOTIC ASSISTED COLOSTOMY TAKEDOWN N/A 03/11/2020   Procedure: ROBOTIC ASSISTED COLOSTOMY REVERSAL, RIGID PROCTOSCOPY;  Surgeon: Leighton Ruff, MD;   [facility];  Service: General;  Laterality: N/A;    There were no vitals filed for this visit.   Subjective Assessment - 03/11/21 1228     Subjective Pt with no new complaints. Is late to appt today.    Currently in Pain? No/denies                               [facility] Adult PT Treatment/Exercise - 03/11/21 0001       Exercises   Exercises Knee/Hip      Knee/Hip Exercises: Standing   Hip Flexion 20 reps;Knee bent    Forward Step Up --    Other Standing Knee Exercises L/R and A/P weight shifts, all x 20 ea bil; bwd walking 10 ft x 2;  Lateral stepping w weight shifts x 10 bil; 1/2 tandem stance 30 sec x 2 bil;      Knee/Hip Exercises: Seated   Long Arc Quad --    Long Arc Quad [employer] --    Sit to [employer] without UE support;20 reps   regular arm chair                     PT Short Term Goals - 03/08/21 1545       PT SHORT TERM GOAL #1   Title Pt to be independent wtih initial HEP    Time 2    Period Weeks    Status New    Target Date 03/09/21      PT SHORT TERM GOAL #2   Title Pt to demo ability for sit to stand x 10 without use of UE support from regular chair.    Time 3    Period Weeks    Status New    Target Date 03/16/21               PT Long Term Goals - 03/08/21 1547       PT LONG TERM GOAL #1   Title Pt to be independent with final HEP    Time 6    Period Weeks    Status New    Target Date 04/06/21      PT LONG TERM GOAL #2   Title Pt to demo improved strength of hips and knees to 5/5 to improve stability, endurance, and ability for stair climbing.    Time 6    Period Weeks    Status New    Target Date 04/06/21      PT LONG TERM GOAL #3   Title Pt to demo improved score on BERG by at least 5 points    Time 6    Period Weeks    Status New    Target Date 04/06/21      PT LONG TERM GOAL #4   Title Pt to demo improved score of 5 time sit to stand to under  12 sec.    Time 6    Period Weeks    Status New     Target Date 04/06/21                   Plan - 03/11/21 1314     Clinical Impression Statement Pt challenged with stability exercises today. Noted weakness and fatigue in lower legs with balance and weight shfit exercises. Very challenged with bwd walking due to leg weakness and stability. Pt requires seated rest breaks during session due to leg fatigue. Pt to benefit from continued progression of stability , balance and strength.    Personal Factors and Comorbidities Fitness;Comorbidity 1    Comorbidities recent surgery with complications    Examination-Activity Limitations Lift;Stand;Locomotion Level;Bend;Carry;Squat;Stairs    Examination-Participation Restrictions Cleaning;Yard Work;Community Activity;Shop    Stability/Clinical Decision Making Stable/Uncomplicated    Rehab Potential Good    PT Frequency 2x / week    PT Duration 6 weeks    PT Treatment/Interventions ADLs/Self Care Home Management;Canalith Repostioning;Cryotherapy;Electrical Stimulation;Iontophoresis '4mg'$ /ml Dexamethasone;Moist Heat;DME Instruction;Gait training;Stair training;Functional mobility training;Therapeutic activities;Therapeutic exercise;Balance training;Neuromuscular re-education;Patient/family education;Orthotic Fit/Training;Manual techniques;Passive range of motion;Energy conservation;Taping;Splinting;Spinal Manipulations;Joint Manipulations    PT Home Exercise Plan [id number]    Consulted and Agree with Plan of Care Patient             Patient will benefit from skilled therapeutic intervention in order to improve the following deficits and impairments:  Abnormal gait, Pain, Decreased mobility, Decreased coordination, Decreased strength, Decreased endurance, Decreased activity tolerance, Decreased safety awareness, Decreased balance  Visit Diagnosis: Muscle weakness (generalized)  Other abnormalities of gait and mobility     Problem List Patient Active Problem List   Diagnosis Date Noted    B12 deficiency 01/06/2021   Vitamin D deficiency 01/06/2021   Aortic atherosclerosis ([place]) 01/06/2021   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[place]) 01/06/2021   Allergic rhinitis 01/06/2021   BPH associated with nocturia 01/06/2021   Abdominal wall abscess 10/28/2020   S/P hernia repair 10/13/2020   Osteoarthritis of glenohumeral joint, left 09/21/2017   Non-traumatic rotator cuff tear 09/21/2017   CAD (coronary artery disease) 08/12/2013   Essential hypertension 03/15/2011   GERD (gastroesophageal reflux disease) 03/15/2011   Lyndee Hensen, PT, DPT 1:16 PM  03/11/21    [facility] [address] [place], [place], [address] Phone: [phone]   Fax:  [phone]  Name: KILE JUETT MRN: [id number] Date of Birth: Dec 09, 1940

## 2021-03-15 ENCOUNTER — Encounter: Payer: Medicare Other | Admitting: Physical Therapy

## 2021-03-17 ENCOUNTER — Encounter: Payer: Medicare Other | Admitting: Physical Therapy

## 2021-03-22 ENCOUNTER — Ambulatory Visit (INDEPENDENT_AMBULATORY_CARE_PROVIDER_SITE_OTHER): Payer: Medicare Other | Admitting: Physical Therapy

## 2021-03-22 ENCOUNTER — Encounter: Payer: Medicare Other | Admitting: Physical Therapy

## 2021-03-22 ENCOUNTER — Encounter: Payer: Self-pay | Admitting: Physical Therapy

## 2021-03-22 DIAGNOSIS — M6281 Muscle weakness (generalized): Secondary | ICD-10-CM

## 2021-03-22 DIAGNOSIS — R2689 Other abnormalities of gait and mobility: Secondary | ICD-10-CM

## 2021-03-22 NOTE — Therapy (Signed)
[facility] [address] [place], [place], [address] Phone: [phone]   Fax:  [phone]  Physical Therapy Treatment  Patient Details  Name: Wesley [name] MRN: [id number] Date of Birth: [birth year] Referring Provider (PT): Garret Reddish   Encounter Date: 03/22/2021   PT End of Session - 03/22/21 1452     Visit Number 4    Number of Visits 12    Date for PT Re-Evaluation 04/06/21    Authorization Type Medicare    PT Start Time 1303    PT Stop Time 1344    PT Time Calculation (min) 41 min    Equipment Utilized During Treatment Gait belt    Activity Tolerance Patient tolerated treatment well    Behavior During Therapy WFL for tasks assessed/performed             Past Medical History:  Diagnosis Date   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 symptoms (LUTS)    CAD (coronary artery disease)    Cholelithiasis    Chronic allergic rhinitis    Coronary atherosclerosis of native coronary artery    Fatty liver    GERD (gastroesophageal reflux disease)    HX 57yr ago, no longer a problem, no meds   High blood pressure    Hypercholesterolemia    Macrocytosis without anemia    OSA on CPAP    Paraesophageal hernia    [place]    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 PT denies significant findings   CARDIAC CATHETERIZATION  2008   COLOSTOMY N/A 10/31/2019   Procedure: COLOSTOMY;  Surgeon: CErroll Luna MD;  [facility];  Service: General;  Laterality: N/A;   CYSTOSCOPY W/ URETERAL STENT PLACEMENT Bilateral 10/31/2019   Procedure: CYSTOSCOPY URETERAL  STENT PLACEMENT BILATERAL;  Surgeon: DFranchot Gallo MD;  [facility];  Service: Urology;  Laterality: Bilateral;   CYSTOSCOPY WITH STENT PLACEMENT Bilateral 03/11/2020   Procedure: CYSTOSCOPY WITH BILATERAL FIREFLY INJECTION;  Surgeon: WIrine Seal MD;  [facility];  Service: Urology;  Laterality: Bilateral;   EYE SURGERY     BILATERAL CATARACT SURGERY WITH LENS IMPLANTS   FLEXIBLE SIGMOIDOSCOPY N/A 10/29/2019   Procedure: FLEXIBLE SIGMOIDOSCOPY;  Surgeon: PJerene Bears MD;  [facility];  Service: Gastroenterology;  Laterality: N/A;   INCISIONAL HERNIA REPAIR N/A 10/13/2020   Procedure: OPEN INCISIONAL HERNIA REPAIR WITH MESH;  Surgeon: RRalene Ok MD;  [facility]  Service: General;  Laterality: N/A;   INSERTION OF MESH N/A 09/08/2016   Procedure: INSERTION OF MESH;  Surgeon: TJackolyn Confer MD;  [facility];  Service: General;  Laterality: N/A;   IR RADIOLOGIST EVAL & MGMT  11/10/2020   IR RADIOLOGIST EVAL & MGMT  11/25/2020   LAPAROSCOPIC SIGMOID COLECTOMY N/A 10/31/2019   Procedure: DIAGNOSTIC LAPAROSCOPY; EXPLORATORY LAPAROTOMY; SIGMOID COLECTOMY;  Surgeon: CErroll Luna MD;  [facility];  Service: General;  Laterality: N/A;   right rotator cuff     SUBMUCOSAL TATTOO INJECTION  10/29/2019   Procedure: SUBMUCOSAL TATTOO INJECTION;  Surgeon: PJerene Bears MD;  [facility];  Service: Gastroenterology;;   TONSILLECTOMY     UMBILICAL HERNIA REPAIR N/A 09/08/2016   Procedure: OPEN UMBILICAL HERNIA REPAIR WITH MESH;  Surgeon: Jackolyn Confer, MD;  [facility];  Service: General;  Laterality: N/A;   VENTRAL HERNIA REPAIR N/A 02/03/2021   Procedure: LAPAROSCOPIC VENTRAL HERNIA REPAIR WITH MESH;  Surgeon: [name], MD;  Location: [facility];  Service: General;  Laterality: N/A;   XI ROBOTIC ASSISTED COLOSTOMY TAKEDOWN N/A 03/11/2020   Procedure: ROBOTIC ASSISTED COLOSTOMY REVERSAL, RIGID PROCTOSCOPY;  Surgeon: Leighton Ruff, MD;   [facility];  Service: General;  Laterality: N/A;    There were no vitals filed for this visit.   Subjective Assessment - 03/22/21 1448     Subjective Pt reports doing HEP    Currently in Pain? No/denies    Pain Score 0-No pain                               [facility] Adult PT Treatment/Exercise - 03/22/21 0001       Knee/Hip Exercises: Aerobic   Recumbent Bike L1 x 6 min;      Knee/Hip Exercises: Standing   Hip Flexion 20 reps;Knee bent    Forward Step Up 10 reps;Hand Hold: 1;Step Height: 6";Both    Stairs up/down 5 steps 1 HR, x 6, recipricol    Other Standing Knee Exercises AirEx: L/R and A/P weight shifts, all x 20 ea bil; Step ups onto AirEx x10 bil;    Other Standing Knee Exercises Toe Taps on step x 20, minimal 1 UE support;      Knee/Hip Exercises: Seated   Sit to Sand 20 reps   10lb weight     Knee/Hip Exercises: Supine   Bridges 15 reps    Other Supine Knee/Hip Exercises TA contraction x 10 with breathing and education on active contraction;                      PT Short Term Goals - 03/08/21 1545       PT SHORT TERM GOAL #1   Title Pt to be independent wtih initial HEP    Time 2    Period Weeks    Status New    Target Date 03/09/21      PT SHORT TERM GOAL #2   Title Pt to demo ability for sit to stand x 10 without use of UE support from regular chair.    Time 3    Period Weeks    Status New    Target Date 03/16/21               PT Long Term Goals - 03/08/21 1547       PT LONG TERM GOAL #1   Title Pt to be independent with final HEP    Time 6    Period Weeks    Status New    Target Date 04/06/21      PT LONG TERM GOAL #2   Title Pt to demo improved strength of hips and knees to 5/5 to improve stability, endurance, and ability for stair climbing.    Time 6    Period Weeks    Status New    Target Date 04/06/21      PT LONG TERM GOAL #3   Title Pt to demo improved score on BERG by at least 5 points     Time 6  Period Weeks    Status New    Target Date 04/06/21      PT LONG TERM GOAL #4   Title Pt to demo improved score of 5 time sit to stand to under 12 sec.    Time 6    Period Weeks    Status New    Target Date 04/06/21                   Plan - 03/22/21 1454     Clinical Impression Statement Pt with less need for seated rest breaks today from leg weakness, improved tolerance for activity. Did educate on and practice light TA contraction, pt with good ability to perform and no pain. Pt to benefit from continued practice with LE strenth and stability. Noted difficulty with stability on R vs L due to weakness in foot.    Personal Factors and Comorbidities Fitness;Comorbidity 1    Comorbidities recent surgery with complications    Examination-Activity Limitations Lift;Stand;Locomotion Level;Bend;Carry;Squat;Stairs    Examination-Participation Restrictions Cleaning;Yard Work;Community Activity;Shop    Stability/Clinical Decision Making Stable/Uncomplicated    Rehab Potential Good    PT Frequency 2x / week    PT Duration 6 weeks    PT Treatment/Interventions ADLs/Self Care Home Management;Canalith Repostioning;Cryotherapy;Electrical Stimulation;Iontophoresis '4mg'$ /ml Dexamethasone;Moist Heat;DME Instruction;Gait training;Stair training;Functional mobility training;Therapeutic activities;Therapeutic exercise;Balance training;Neuromuscular re-education;Patient/family education;Orthotic Fit/Training;Manual techniques;Passive range of motion;Energy conservation;Taping;Splinting;Spinal Manipulations;Joint Manipulations    PT Home Exercise Plan [id number]    Consulted and Agree with Plan of Care Patient             Patient will benefit from skilled therapeutic intervention in order to improve the following deficits and impairments:  Abnormal gait, Pain, Decreased mobility, Decreased coordination, Decreased strength, Decreased endurance, Decreased activity tolerance, Decreased safety  awareness, Decreased balance  Visit Diagnosis: Muscle weakness (generalized)  Other abnormalities of gait and mobility     Problem List Patient Active Problem List   Diagnosis Date Noted   B12 deficiency 01/06/2021   Vitamin D deficiency 01/06/2021   Aortic atherosclerosis ([place]) 01/06/2021   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[place]) 01/06/2021   Allergic rhinitis 01/06/2021   BPH associated with nocturia 01/06/2021   Abdominal wall abscess 10/28/2020   S/P hernia repair 10/13/2020   Osteoarthritis of glenohumeral joint, left 09/21/2017   Non-traumatic rotator cuff tear 09/21/2017   CAD (coronary artery disease) 08/12/2013   Essential hypertension 03/15/2011   GERD (gastroesophageal reflux disease) 03/15/2011   Lyndee Hensen, PT, DPT 2:56 PM  03/22/21    [facility] [address] [place], [place], [address] Phone: [phone]   Fax:  [phone]  Name: Wesley [name] MRN: [id number] Date of Birth: [birth year]

## 2021-03-31 ENCOUNTER — Ambulatory Visit (INDEPENDENT_AMBULATORY_CARE_PROVIDER_SITE_OTHER): Payer: Medicare Other | Admitting: Physical Therapy

## 2021-03-31 ENCOUNTER — Other Ambulatory Visit: Payer: Self-pay

## 2021-03-31 ENCOUNTER — Encounter: Payer: Self-pay | Admitting: Physical Therapy

## 2021-03-31 DIAGNOSIS — M6281 Muscle weakness (generalized): Secondary | ICD-10-CM | POA: Diagnosis not present

## 2021-03-31 DIAGNOSIS — R2689 Other abnormalities of gait and mobility: Secondary | ICD-10-CM

## 2021-03-31 NOTE — Therapy (Signed)
[facility] [address] [place], [place], [address] Phone: [phone]   Fax:  [phone]  Physical Therapy Treatment  Patient Details  Name: Wesley [name] MRN: [id number] Date of Birth: 06/20/1941 Referring Provider (PT): Garret Reddish   Encounter Date: 03/31/2021   PT End of Session - 03/31/21 1304     Visit Number 5    Number of Visits 12    Date for PT Re-Evaluation 04/06/21    Authorization Type Medicare    PT Start Time 1304    PT Stop Time 1342    PT Time Calculation (min) 38 min    Activity Tolerance Patient tolerated treatment well    Behavior During Therapy [facility] for tasks assessed/performed             Past Medical History:  Diagnosis Date   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 symptoms (LUTS)    CAD (coronary artery disease)    Cholelithiasis    Chronic allergic rhinitis    Coronary atherosclerosis of native coronary artery    Fatty liver    GERD (gastroesophageal reflux disease)    HX 29yr ago, no longer a problem, no meds   High blood pressure    Hypercholesterolemia    Macrocytosis without anemia    OSA on CPAP    Paraesophageal hernia    [place]    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 PT denies significant findings   CARDIAC CATHETERIZATION  2008   COLOSTOMY N/A 10/31/2019   Procedure: COLOSTOMY;  Surgeon: CErroll Luna MD;  [facility];  Service: General;  Laterality: N/A;   CYSTOSCOPY W/ URETERAL STENT PLACEMENT Bilateral 10/31/2019   Procedure: CYSTOSCOPY URETERAL STENT PLACEMENT BILATERAL;  Surgeon: DFranchot Gallo MD;  [facility];  Service: Urology;  Laterality: Bilateral;   CYSTOSCOPY WITH STENT PLACEMENT Bilateral 03/11/2020   Procedure: CYSTOSCOPY WITH BILATERAL FIREFLY INJECTION;  Surgeon: WIrine Seal MD;  [facility];  Service: Urology;  Laterality: Bilateral;   EYE SURGERY     BILATERAL CATARACT SURGERY WITH LENS IMPLANTS   FLEXIBLE SIGMOIDOSCOPY N/A 10/29/2019   Procedure: FLEXIBLE SIGMOIDOSCOPY;  Surgeon: PJerene Bears MD;  [facility];  Service: Gastroenterology;  Laterality: N/A;   INCISIONAL HERNIA REPAIR N/A 10/13/2020   Procedure: OPEN INCISIONAL HERNIA REPAIR WITH MESH;  Surgeon: RRalene Ok MD;  [facility]  Service: General;  Laterality: N/A;   INSERTION OF MESH N/A 09/08/2016   Procedure: INSERTION OF MESH;  Surgeon: TJackolyn Confer MD;  [facility];  Service: General;  Laterality: N/A;   IR RADIOLOGIST EVAL & MGMT  11/10/2020   IR RADIOLOGIST EVAL & MGMT  11/25/2020   LAPAROSCOPIC SIGMOID COLECTOMY N/A 10/31/2019   Procedure: DIAGNOSTIC LAPAROSCOPY; EXPLORATORY LAPAROTOMY; SIGMOID COLECTOMY;  Surgeon: CErroll Luna MD;  [facility];  Service: General;  Laterality: N/A;   right rotator cuff     SUBMUCOSAL TATTOO INJECTION  10/29/2019   Procedure: SUBMUCOSAL TATTOO INJECTION;  Surgeon: PJerene Bears MD;  [facility];  Service: Gastroenterology;;   TONSILLECTOMY  UMBILICAL HERNIA REPAIR N/A 09/08/2016   Procedure: OPEN UMBILICAL HERNIA REPAIR WITH MESH;  Surgeon: Jackolyn Confer, MD;  [facility];  Service: General;  Laterality: N/A;   VENTRAL HERNIA REPAIR N/A 02/03/2021   Procedure: LAPAROSCOPIC VENTRAL HERNIA REPAIR WITH MESH;  Surgeon: [name], MD;  Location: [facility];  Service: General;  Laterality: N/A;   XI ROBOTIC ASSISTED COLOSTOMY TAKEDOWN N/A 03/11/2020   Procedure: ROBOTIC ASSISTED COLOSTOMY REVERSAL, RIGID PROCTOSCOPY;  Surgeon: Leighton Ruff, MD;  [facility];  Service: General;  Laterality: N/A;     There were no vitals filed for this visit.   Subjective Assessment - 03/31/21 1309     Subjective I need to get more consistent with doing my HEP.    Patient Stated Goals increase physical activity, strengthening,    Currently in Pain? No/denies                               [facility] Adult PT Treatment/Exercise - 03/31/21 0001       Knee/Hip Exercises: Aerobic   Recumbent Bike L1 x 6 min;      Knee/Hip Exercises: Standing   Heel Raises Limitations unable to perform more than 3 and only partial range    Hip Flexion 20 reps;Knee bent    Forward Step Up 10 reps;Hand Hold: 1;Step Height: 6";Both    Functional Squat 10 reps    Functional Squat Limitations to mat table    Stairs up/down 5 steps 1 HR, x 6, recipricol    Other Standing Knee Exercises AirEx: L/R and A/P weight shifts, all x 20 ea bil; Step ups onto AirEx x10 bil;    Other Standing Knee Exercises Toe Taps on step x 20, minimal 1 UE support;      Knee/Hip Exercises: Seated   Other Seated Knee/Hip Exercises seated heel raises x 10    Sit to Sand 20 reps   10lb weight     Knee/Hip Exercises: Supine   Bridges 20 reps                      PT Short Term Goals - 03/31/21 1338       PT SHORT TERM GOAL #1   Title Pt to be independent wtih initial HEP    Status On-going      PT SHORT TERM GOAL #2   Title Pt to demo ability for sit to stand x 10 without use of UE support from regular chair.    Status Achieved               PT Long Term Goals - 03/08/21 1547       PT LONG TERM GOAL #1   Title Pt to be independent with final HEP    Time 6    Period Weeks    Status New    Target Date 04/06/21      PT LONG TERM GOAL #2   Title Pt to demo improved strength of hips and knees to 5/5 to improve stability, endurance, and ability for stair climbing.    Time 6    Period Weeks    Status New    Target Date 04/06/21      PT LONG TERM GOAL #3   Title Pt to demo improved score on  BERG by at least 5 points    Time 6    Period Weeks    Status  New    Target Date 04/06/21      PT LONG TERM GOAL #4   Title Pt to demo improved score of 5 time sit to stand to under 12 sec.    Time 6    Period Weeks    Status New    Target Date 04/06/21                   Plan - 03/31/21 1600     Clinical Impression Statement Patient did well today with no seated rest breaks. Still significant difficulty with standing heel raises. His functional LE strength is progressing but still has significant deficits. He is not fully compliant with HEP per self report. He will continue to benefit from PT to address strength and balance.    PT Frequency 2x / week    PT Duration 6 weeks    PT Treatment/Interventions ADLs/Self Care Home Management;Canalith Repostioning;Cryotherapy;Electrical Stimulation;Iontophoresis '4mg'$ /ml Dexamethasone;Moist Heat;DME Instruction;Gait training;Stair training;Functional mobility training;Therapeutic activities;Therapeutic exercise;Balance training;Neuromuscular re-education;Patient/family education;Orthotic Fit/Training;Manual techniques;Passive range of motion;Energy conservation;Taping;Splinting;Spinal Manipulations;Joint Manipulations    PT Home Exercise Plan [id number]    Consulted and Agree with Plan of Care Patient             Patient will benefit from skilled therapeutic intervention in order to improve the following deficits and impairments:  Abnormal gait, Pain, Decreased mobility, Decreased coordination, Decreased strength, Decreased endurance, Decreased activity tolerance, Decreased safety awareness, Decreased balance  Visit Diagnosis: Muscle weakness (generalized)  Other abnormalities of gait and mobility     Problem List Patient Active Problem List   Diagnosis Date Noted   B12 deficiency 01/06/2021   Vitamin D deficiency 01/06/2021   Aortic atherosclerosis ([place]) 01/06/2021   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[place]) 01/06/2021    Allergic rhinitis 01/06/2021   BPH associated with nocturia 01/06/2021   Abdominal wall abscess 10/28/2020   S/P hernia repair 10/13/2020   Osteoarthritis of glenohumeral joint, left 09/21/2017   Non-traumatic rotator cuff tear 09/21/2017   CAD (coronary artery disease) 08/12/2013   Essential hypertension 03/15/2011   GERD (gastroesophageal reflux disease) 03/15/2011   Madelyn Flavors PT 03/31/2021, 4:06 PM  [facility] [address] [place], [place], [address] Phone: [phone]   Fax:  [phone]  Name: JAQUELINE HASSEBROCK MRN: [id number] Date of Birth: Aug 22, 1940

## 2021-04-04 ENCOUNTER — Encounter: Payer: Medicare Other | Admitting: Physical Therapy

## 2021-04-04 ENCOUNTER — Telehealth: Payer: Self-pay

## 2021-04-04 ENCOUNTER — Emergency Department (HOSPITAL_COMMUNITY): Payer: Medicare Other

## 2021-04-04 ENCOUNTER — Encounter (HOSPITAL_COMMUNITY): Payer: Self-pay

## 2021-04-04 ENCOUNTER — Emergency Department (HOSPITAL_COMMUNITY)
Admission: EM | Admit: 2021-04-04 | Discharge: 2021-04-04 | Disposition: A | Discharge status: Home / Self Care | Payer: Medicare Other | Attending: Emergency Medicine | Admitting: Emergency Medicine

## 2021-04-04 ENCOUNTER — Other Ambulatory Visit: Payer: Self-pay

## 2021-04-04 DIAGNOSIS — I2510 Atherosclerotic heart disease of native coronary artery without angina pectoris: Secondary | ICD-10-CM | POA: Diagnosis not present

## 2021-04-04 DIAGNOSIS — R2681 Unsteadiness on feet: Secondary | ICD-10-CM | POA: Diagnosis not present

## 2021-04-04 DIAGNOSIS — R42 Dizziness and giddiness: Secondary | ICD-10-CM | POA: Insufficient documentation

## 2021-04-04 DIAGNOSIS — Z85828 Personal history of other malignant neoplasm of skin: Secondary | ICD-10-CM | POA: Diagnosis not present

## 2021-04-04 DIAGNOSIS — R58 Hemorrhage, not elsewhere classified: Secondary | ICD-10-CM | POA: Diagnosis not present

## 2021-04-04 DIAGNOSIS — Z7982 Long term (current) use of aspirin: Secondary | ICD-10-CM | POA: Diagnosis not present

## 2021-04-04 DIAGNOSIS — Z79899 Other long term (current) drug therapy: Secondary | ICD-10-CM | POA: Insufficient documentation

## 2021-04-04 DIAGNOSIS — I10 Essential (primary) hypertension: Secondary | ICD-10-CM | POA: Diagnosis not present

## 2021-04-04 DIAGNOSIS — S0990XA Unspecified injury of head, initial encounter: Secondary | ICD-10-CM | POA: Diagnosis not present

## 2021-04-04 DIAGNOSIS — D3610 Benign neoplasm of peripheral nerves and autonomic nervous system, unspecified: Secondary | ICD-10-CM | POA: Diagnosis not present

## 2021-04-04 DIAGNOSIS — W19XXXA Unspecified fall, initial encounter: Secondary | ICD-10-CM | POA: Diagnosis not present

## 2021-04-04 LAB — CBC WITH DIFFERENTIAL/PLATELET
Abs Immature Granulocytes: 0.04 10*3/uL (ref 0.00–0.07)
Basophils Absolute: 0 10*3/uL (ref 0.0–0.1)
Basophils Relative: 1 %
Eosinophils Absolute: 0.1 10*3/uL (ref 0.0–0.5)
Eosinophils Relative: 2 %
HCT: 42.9 % (ref 39.0–52.0)
Hemoglobin: 14.1 g/dL (ref 13.0–17.0)
Immature Granulocytes: 1 %
Lymphocytes Relative: 27 %
Lymphs Abs: 1.7 10*3/uL (ref 0.7–4.0)
MCH: 33.8 pg (ref 26.0–34.0)
MCHC: 32.9 g/dL (ref 30.0–36.0)
MCV: 102.9 fL — ABNORMAL HIGH (ref 80.0–100.0)
Monocytes Absolute: 0.7 10*3/uL (ref 0.1–1.0)
Monocytes Relative: 11 %
Neutro Abs: 3.8 10*3/uL (ref 1.7–7.7)
Neutrophils Relative %: 58 %
Platelets: 175 10*3/uL (ref 150–400)
RBC: 4.17 MIL/uL — ABNORMAL LOW (ref 4.22–5.81)
RDW: 15.1 % (ref 11.5–15.5)
WBC: 6.3 10*3/uL (ref 4.0–10.5)
nRBC: 0 % (ref 0.0–0.2)

## 2021-04-04 LAB — BASIC METABOLIC PANEL
Anion gap: 5 (ref 5–15)
BUN: 17 mg/dL (ref 8–23)
CO2: 29 mmol/L (ref 22–32)
Calcium: 9 mg/dL (ref 8.9–10.3)
Chloride: 103 mmol/L (ref 98–111)
Creatinine, Ser: 0.81 mg/dL (ref 0.61–1.24)
GFR, Estimated: >60 mL/min (ref >60)
Glucose, Bld: 91 mg/dL (ref 70–99)
Potassium: 4.2 mmol/L (ref 3.5–5.1)
Sodium: 137 mmol/L (ref 135–145)

## 2021-04-04 IMAGING — CT CT HEAD W/O CM
3 series · 15 of 47 positions shown, 18 images · non-contrast
Comparison: [DATE].

CLINICAL DATA: HEAD TRAUMA, MINOR

EXAM:
CT HEAD WITHOUT CONTRAST
TECHNIQUE: Contiguous axial images were obtained from the base of the skull
through the vertex without intravenous contrast.

[Series 2: head wo · axial · 0.47mm/px · z∈[-158,-18]mm · 9 of 34 slices shown, 12 images]
[im 3/34  brain]
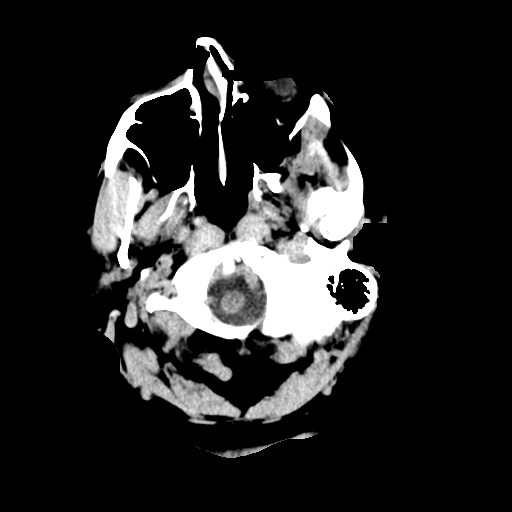
[im 3/34  bone]
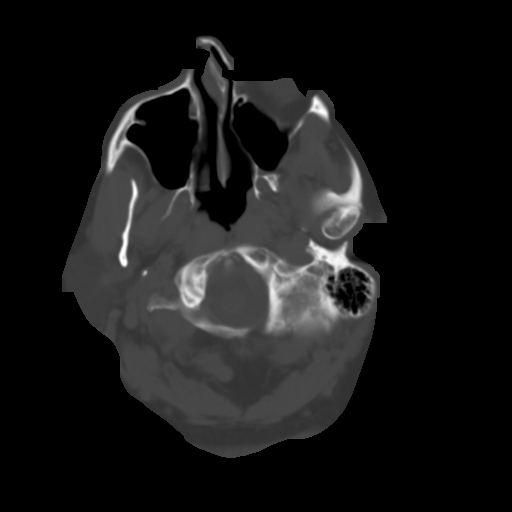
[im 6/34  brain]
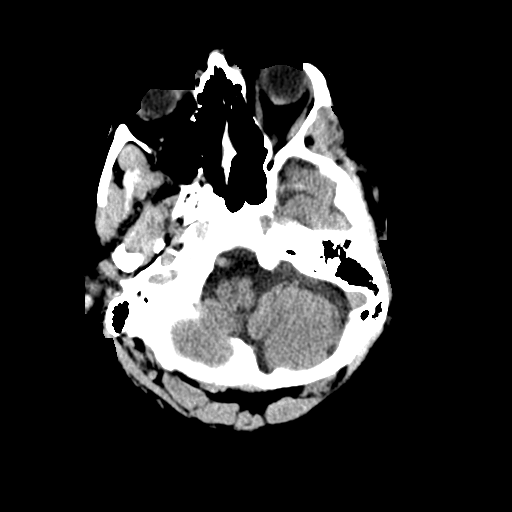
[im 10/34  brain]
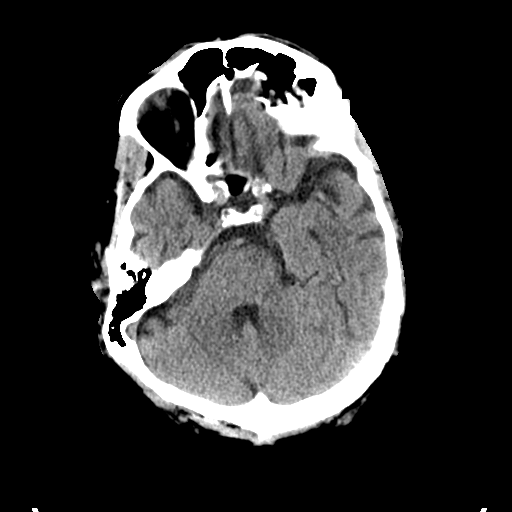
[im 13/34  brain]
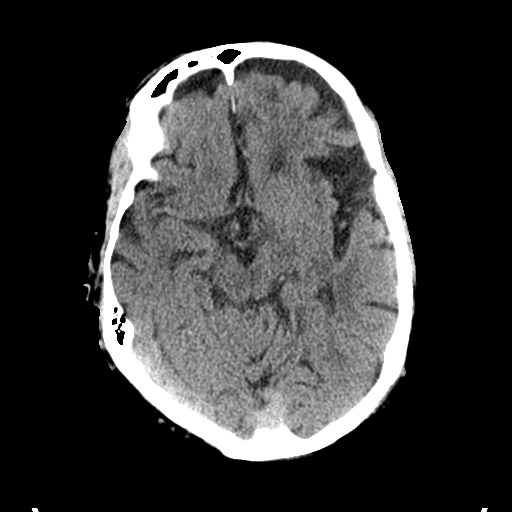
[im 18/34  brain]
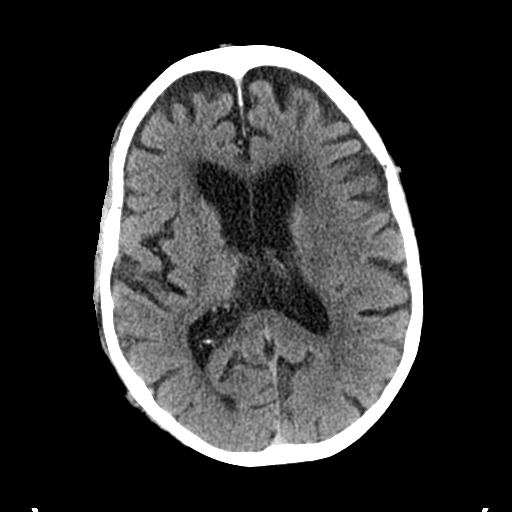
[im 18/34  bone]
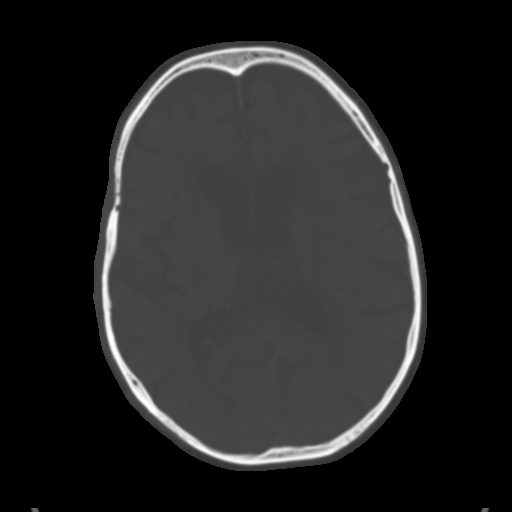
[im 21/34  brain]
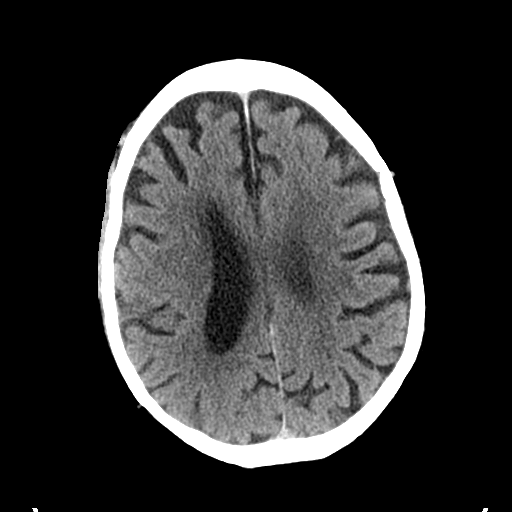
[im 24/34  brain]
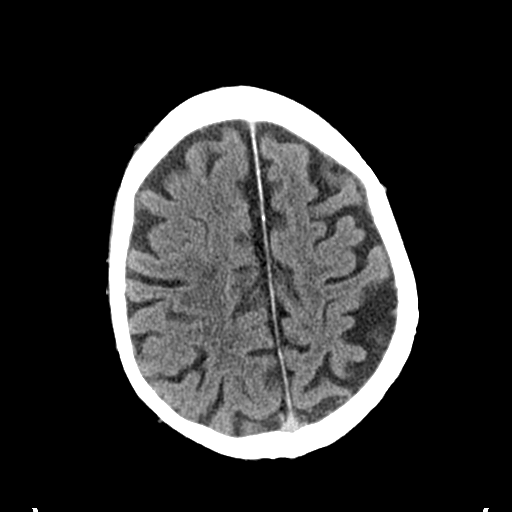
[im 28/34  brain]
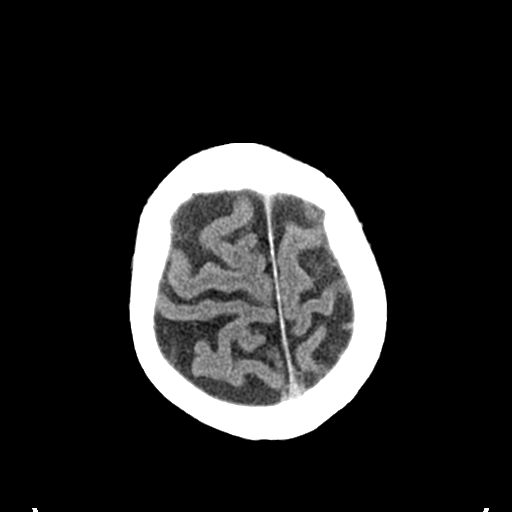
[im 31/34  brain]
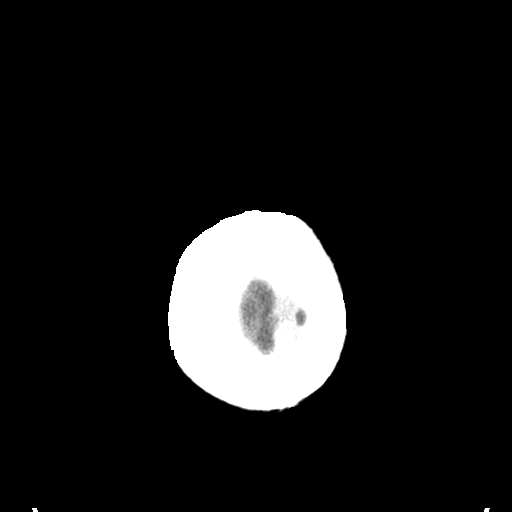
[im 31/34  bone]
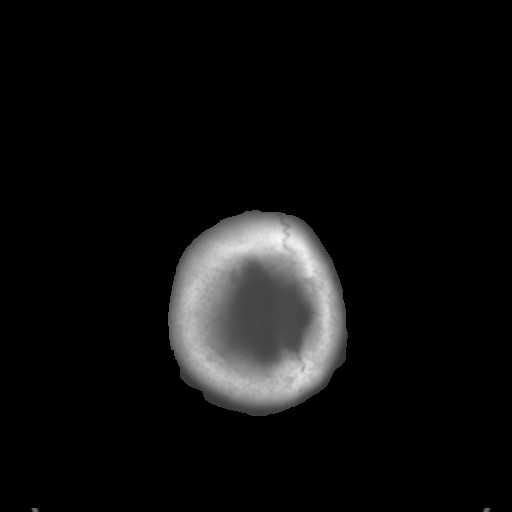

[Series 4: coronal soft tissue · coronal · 0.34mm/px · 3 of 83 slices shown]
[im 28/83  brain]
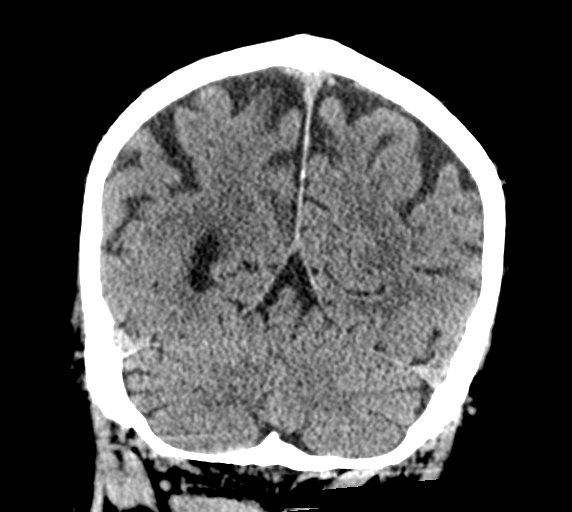
[im 37/83  brain]
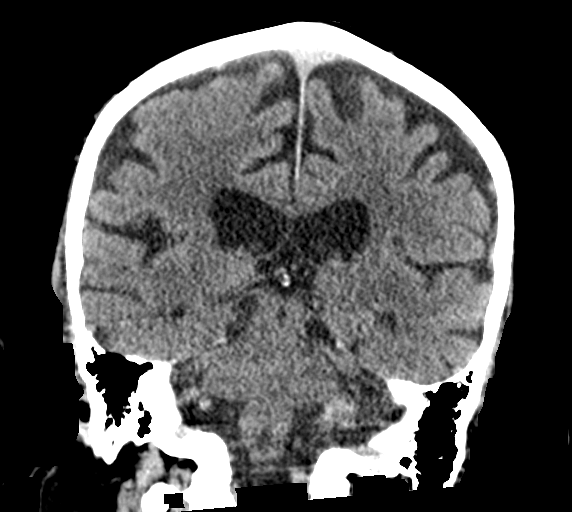
[im 46/83  brain]
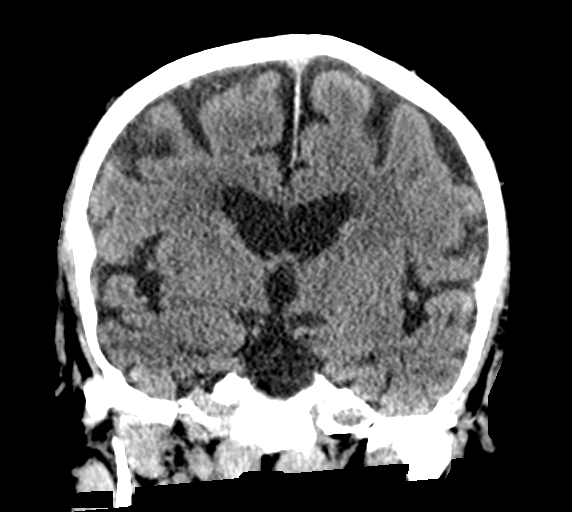

[Series 5: sagittal soft tissue · sagittal · 0.35mm/px · 3 of 61 slices shown]
[im 21/61  brain]
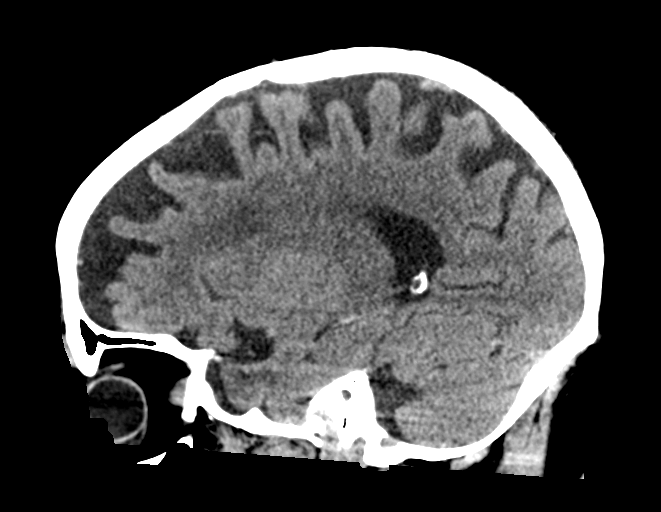
[im 31/61  brain]
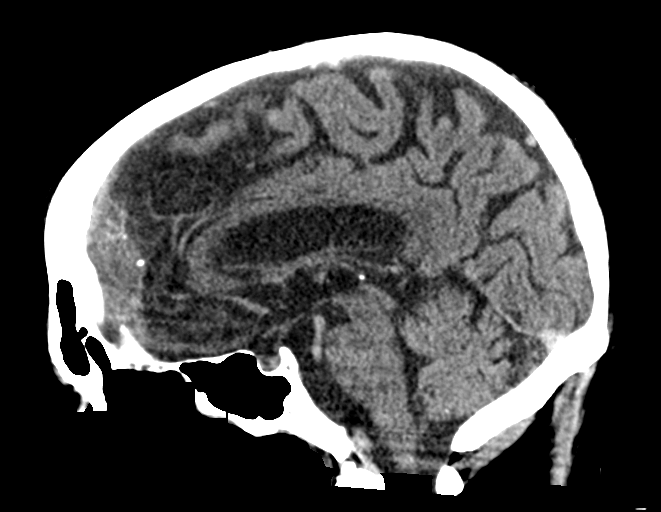
[im 41/61  brain]
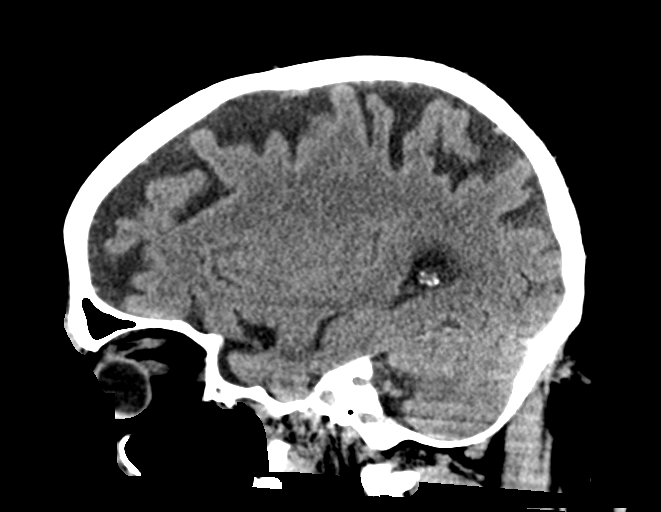

[15 of 47 positions shown; findings below may reference images not displayed]

FINDINGS: Brain: No evidence of acute infarction, hemorrhage, hydrocephalus,
extra-axial collection or mass lesion/mass effect. Periventricular
white matter changes, likely the sequela of chronic small vessel
ischemic disease. Age-appropriate cerebral atrophy.

Vascular: No hyperdense vessel or unexpected calcification.

Skull: Normal. Negative for fracture or focal lesion.

Sinuses/Orbits: No acute finding.

Other: Increased density on the right lateral frontal scalp, likely
abrasion.
IMPRESSION: No acute intracranial process.

## 2021-04-04 IMAGING — MR MR HEAD W/O CM
10 series · 48 of 48 positions shown · non-contrast
Comparison: Prior CT from earlier the same day.

CLINICAL DATA: Initial evaluation for acute dizziness.

EXAM:
MRI HEAD WITHOUT CONTRAST
TECHNIQUE: Multiplanar, multiecho pulse sequences of the brain and surrounding
structures were obtained without intravenous contrast.

[Series 5: DWI · axial · 3.0mm · 1.36mm/px · z∈[-70,+79]mm · 9 of 104 slices shown (1 of 2)]
[im 1/104]
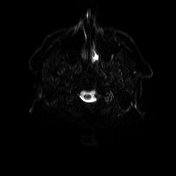
[im 13/104]
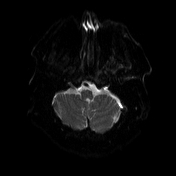
[im 26/104]
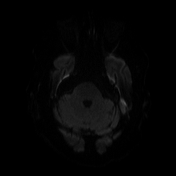
[im 39/104]
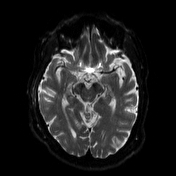
[im 52/104]
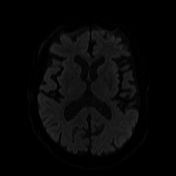
[im 65/104]
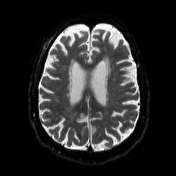
[im 78/104]
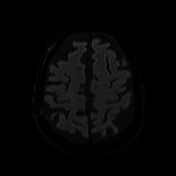
[im 91/104]
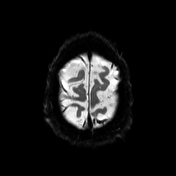
[im 104/104]
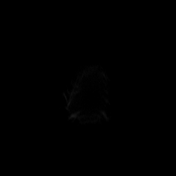

[Series 6: DWI · axial · 3.0mm · 1.36mm/px · z∈[-70,+79]mm · 4 of 52 slices shown (2 of 2)]
[im 1/52]
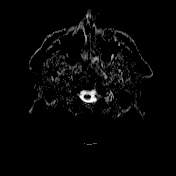
[im 18/52]
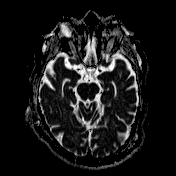
[im 35/52]
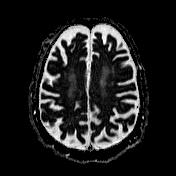
[im 52/52]
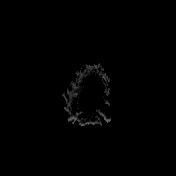

[Series 7: T1 · sagittal · 5.0mm · 0.75mm/px · 2 of 26 slices shown (1 of 2)]
[im 1/26]
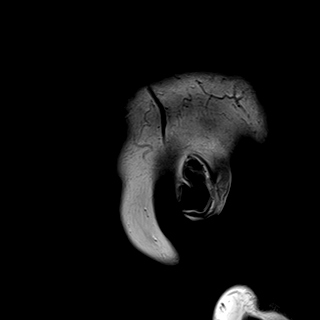
[im 26/26]
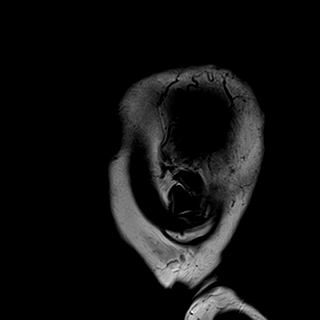

[Series 8: T2 · axial · 5.0mm · 0.62mm/px · z∈[-69,+77]mm · 2 of 24 slices shown (1 of 2)]
[im 1/24]
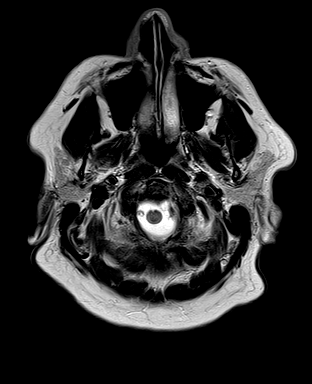
[im 24/24]
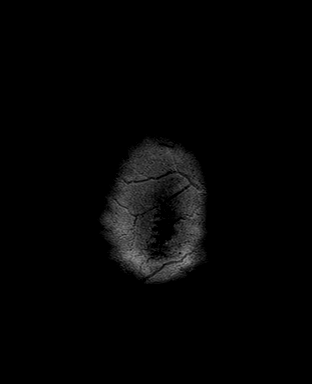

[Series 9: swi_images · axial · 3.0mm · 0.75mm/px · z∈[-76,+85]mm · 4 of 56 slices shown]
[im 1/56]
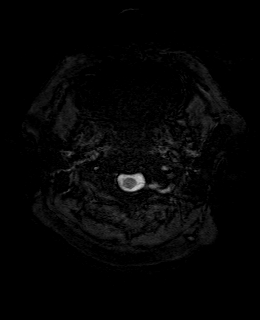
[im 19/56]
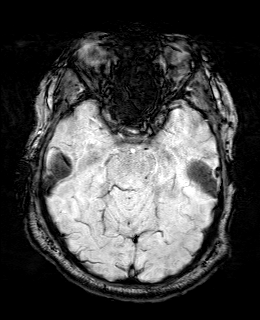
[im 37/56]
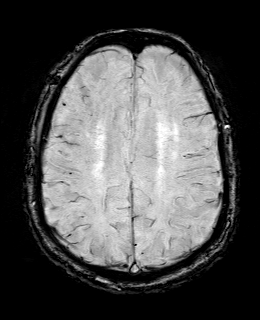
[im 56/56]
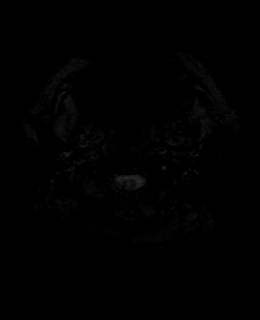

[Series 11: FLAIR · axial · 3.0mm · 0.75mm/px · z∈[-70,+79]mm · 4 of 52 slices shown]
[im 1/52]
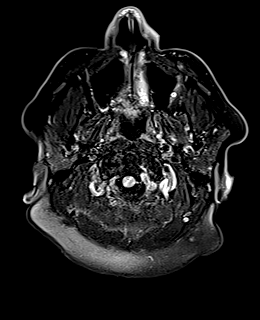
[im 18/52]
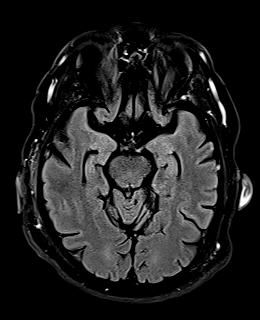
[im 35/52]
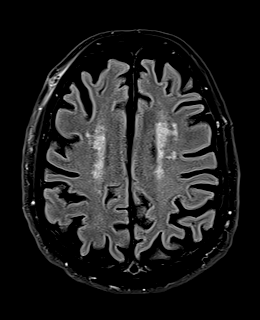
[im 52/52]
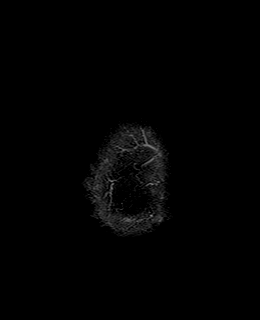

[Series 12: T1 · axial · 1.0mm · 0.94mm/px · z∈[-73,+82]mm · 13 of 160 slices shown (2 of 2)]
[im 1/160]
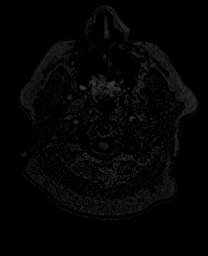
[im 14/160]
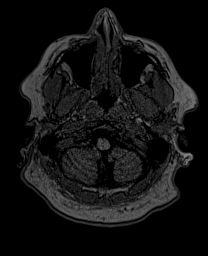
[im 27/160]
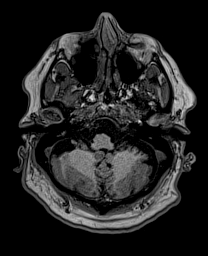
[im 40/160]
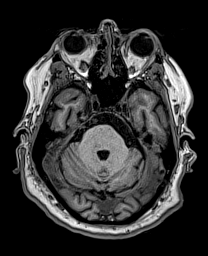
[im 54/160]
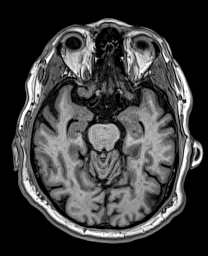
[im 67/160]
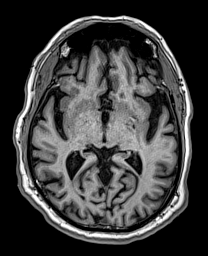
[im 80/160]
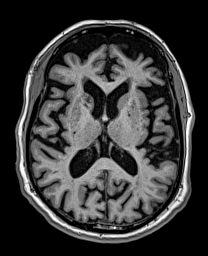
[im 93/160]
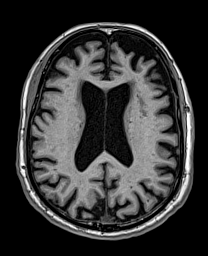
[im 107/160]
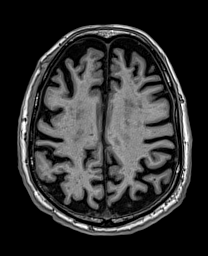
[im 120/160]
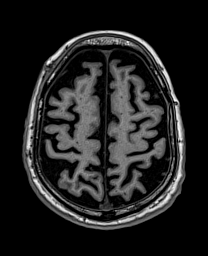
[im 133/160]
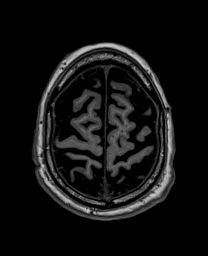
[im 146/160]
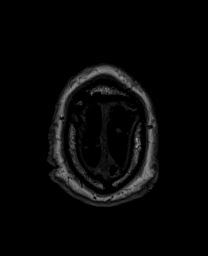
[im 160/160]
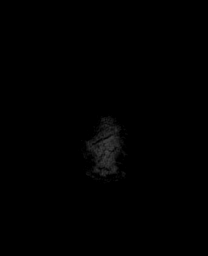

[Series 13: cor dwi_tracew · coronal · 5.0mm · 1.53mm/px · 5 of 60 slices shown]
[im 1/60]
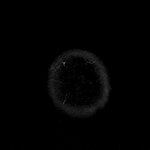
[im 15/60]
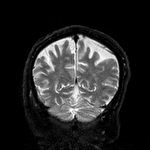
[im 30/60]
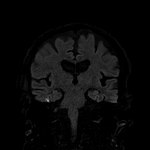
[im 45/60]
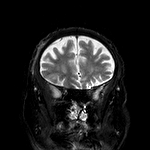
[im 60/60]
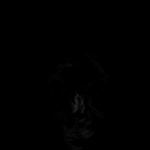

[Series 14: cor dwi_adc · coronal · 5.0mm · 1.53mm/px · 2 of 30 slices shown]
[im 1/30]
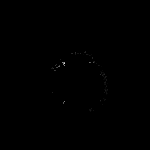
[im 30/30]
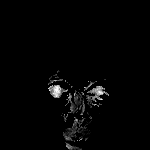

[Series 15: T2 · coronal · 5.0mm · 0.69mm/px · 3 of 38 slices shown (2 of 2)]
[im 1/38]
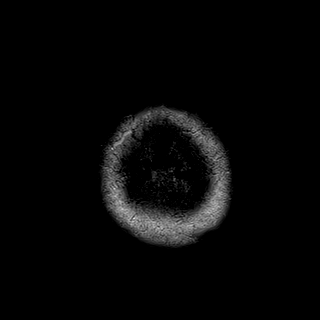
[im 19/38]
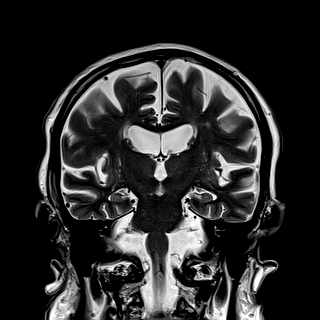
[im 38/38]
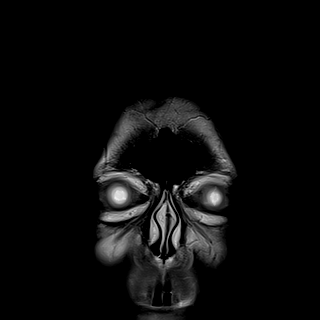

[48 of 48 positions shown; findings below may reference images not displayed]

FINDINGS: Brain: Generalized age-related cerebral atrophy with mild chronic
small vessel ischemic disease. Few small remote left cerebellar
infarcts noted.

No abnormal foci of restricted diffusion to suggest acute or
subacute ischemia. Gray-white matter differentiation maintained. No
encephalomalacia to suggest chronic cortical infarction. No evidence
for acute or chronic intracranial hemorrhage.

There is an approximate 1 cm soft tissue lesion involving the right
IAC (series 11, image 11), suspicious for a small mass, possibly
schwannoma. No other discrete mass lesion. No mass effect or midline
shift. No hydrocephalus or extra-axial fluid collection. Pituitary
gland suprasellar region within normal limits. Midline structures
intact.

Vascular: Right vertebral artery hypoplastic and not well seen.
Major intracranial vascular flow voids are otherwise maintained.

Skull and upper cervical spine: Craniocervical junction within
normal limits. Bone marrow signal intensity normal. No scalp soft
tissue abnormality.

Sinuses/Orbits: Patient status post bilateral ocular lens
replacement. Globes and orbital soft tissues demonstrate no acute
finding. Paranasal sinuses are clear. No mastoid effusion.

Other: None.
IMPRESSION: 1. No acute intracranial abnormality.
2. 1 cm soft tissue lesion involving the right IAC, suspicious for a
small mass, possibly schwannoma. Further evaluation with dedicated
IAC protocol MRI, with and without contrast, suggested for further
evaluation.
3. Age-related cerebral atrophy with mild chronic small vessel
ischemic disease, with a few small remote left cerebellar infarcts.

## 2021-04-04 MED ORDER — MECLIZINE HCL 25 MG PO TABS: 25.0000 mg | ORAL_TABLET | Freq: Three times a day (TID) | ORAL | 0 refills | Status: DC | PRN

## 2021-04-04 NOTE — Telephone Encounter (Signed)
Nurse Assessment Nurse: Hardin Negus, RN, Mardene Celeste Date/Time Eilene Ghazi Time): 04/04/2021 12:14:30 PM Confirm and document reason for call. If symptomatic, describe symptoms. ---He had a fall two days ago and has a bump on his head. Today he is feeling dizzy and lightheaded. No appointments today or tomorrow per office. It is on the right side on his head, no vomiting or nausea. Does the patient have any new or worsening symptoms? ---Yes Will a triage be completed? ---Yes Related visit to physician within the last 2 weeks? ---No Does the PT have any chronic conditions? (i.e. diabetes, asthma, this includes High risk factors for pregnancy, etc.) ---Yes List chronic conditions. ---hypertension Is this a behavioral health or substance abuse call? ---No Guidelines Guideline Title Affirmed Question Affirmed Notes Nurse Date/Time (Eastern Time) Head Injury [1] ACUTE NEURO SYMPTOM AND [2] present now (DEFINITION: difficult to awaken Wesley [name] 04/04/2021 12:16:52 PM PLEASE NOTE: All timestamps contained within this report are represented as [place] Standard Time. CONFIDENTIALTY NOTICE: This fax transmission is intended only for the addressee. It contains information that is legally privileged, confidential or otherwise protected from use or disclosure. If you are not the intended recipient, you are strictly prohibited from reviewing, disclosing, copying using or disseminating any of this information or taking any action in reliance on or regarding this information. If you have received this fax in error, please notify us immediately by telephone so that we can arrange for its return to [place]. Phone: [phone], Toll-Free: [phone], Fax: [phone] Page: 2 of 2 Call Id: [id number] Guidelines Guideline Title Affirmed Question Affirmed Notes Nurse Date/Time Eilene Ghazi Time) OR confused thinking and talking OR slurred speech OR weakness of arms OR unsteady walking) Disp. Time  Eilene Ghazi Time) Disposition Final User 04/04/2021 12:10:58 PM Send to Urgent Jeannette Corpus, Tiffany 04/04/2021 12:22:50 PM 911 Outcome Documentation Hardin Negus, RN, Mardene Celeste Reason: EMS is on the way 04/04/2021 12:17:49 PM Call EMS 911 Now Yes Hardin Negus, RN, Lenox Ponds Disagree/Comply Comply Caller Understands Yes PreDisposition Call Doctor Care Advice Given Per Guideline CALL EMS 911 NOW: CARE ADVICE given per Head Injury (Adult) guideline

## 2021-04-04 NOTE — ED Provider Notes (Signed)
[facility] DEPT Provider Note   CSN: [id number] Arrival date & time: 04/04/21  1428     History Chief Complaint  Patient presents with   Dizziness   Fall    Wesley [name] is a 80 y.o. male.  Patient presents chief complaint of dizziness unsteady gait.  Symptoms been ongoing since early morning.  Denies any headache or pain at this time no neck pain no back pain no chest pain.  Patient states that he fell 2 days ago and hit his head.  He did not have any pain afterwards and he been doing well all day yesterday.  Today woke up and felt his gait was unsteady and not having to use a walker.  Denies any sensation of weakness or numbness.      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   Sleep apnea    Umbilical hernia without obstruction or gangrene     Patient Active Problem List   Diagnosis Date Noted   B12 deficiency 01/06/2021   Vitamin D deficiency 01/06/2021   Aortic atherosclerosis ([facility] 01/06/2021   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[facility] 01/06/2021   Allergic rhinitis 01/06/2021   BPH associated with nocturia 01/06/2021   Abdominal wall abscess 10/28/2020   S/P hernia repair 10/13/2020   Osteoarthritis of glenohumeral joint, left 09/21/2017   Non-traumatic rotator cuff tear 09/21/2017   CAD (coronary artery disease) 08/12/2013   Essential hypertension 03/15/2011   GERD (gastroesophageal reflux disease) 03/15/2011     Past Surgical History:  Procedure Laterality Date   BIOPSY  10/29/2019   Procedure: BIOPSY;  Surgeon: PJerene Bears MD;  Location: [facility];  Service: Gastroenterology;;   CARDIAC CATHETERIZATION  2000   Cath to rule out cardiac problems RT HTN. PT denies significant findings   CARDIAC CATHETERIZATION  2008   COLOSTOMY N/A 10/31/2019   Procedure: COLOSTOMY;  Surgeon: CErroll Luna MD;  Location: [facility];  Service: General;  Laterality: N/A;   CYSTOSCOPY W/ URETERAL STENT PLACEMENT Bilateral 10/31/2019   Procedure: CYSTOSCOPY URETERAL STENT PLACEMENT BILATERAL;  Surgeon: DFranchot Gallo MD;  Location: [facility];  Service: Urology;  Laterality: Bilateral;   CYSTOSCOPY WITH STENT PLACEMENT Bilateral 03/11/2020   Procedure: CYSTOSCOPY WITH BILATERAL FIREFLY INJECTION;  Surgeon: WIrine Seal MD;  Location: [facility];  Service: Urology;  Laterality: Bilateral;   EYE SURGERY     BILATERAL CATARACT SURGERY WITH LENS IMPLANTS   FLEXIBLE SIGMOIDOSCOPY N/A 10/29/2019   Procedure: FLEXIBLE SIGMOIDOSCOPY;  Surgeon: PJerene Bears MD;  [facility];  Service: Gastroenterology;  Laterality: N/A;   INCISIONAL HERNIA REPAIR N/A 10/13/2020   Procedure: OPEN INCISIONAL HERNIA REPAIR WITH MESH;  Surgeon: RRalene Ok MD;  [facility]  Service: General;  Laterality: N/A;   INSERTION OF MESH N/A 09/08/2016   Procedure: INSERTION OF MESH;  Surgeon: TJackolyn Confer MD;  [facility];  Service: General;  Laterality: N/A;   IR RADIOLOGIST EVAL & MGMT  11/10/2020   IR RADIOLOGIST EVAL & MGMT  11/25/2020   LAPAROSCOPIC SIGMOID COLECTOMY N/A 10/31/2019   Procedure: DIAGNOSTIC LAPAROSCOPY; EXPLORATORY LAPAROTOMY; SIGMOID COLECTOMY;  Surgeon: Erroll Luna, MD;  [facility];  Service: General;  Laterality: N/A;   right rotator cuff     SUBMUCOSAL TATTOO INJECTION  10/29/2019   Procedure: SUBMUCOSAL TATTOO INJECTION;  Surgeon: Jerene Bears, MD;  [facility];  Service:  Gastroenterology;;   TONSILLECTOMY     UMBILICAL HERNIA REPAIR N/A 09/08/2016   Procedure: OPEN UMBILICAL HERNIA REPAIR WITH MESH;  Surgeon: Jackolyn Confer, MD;  [facility];  Service: General;  Laterality: N/A;   VENTRAL HERNIA REPAIR N/A 02/03/2021   Procedure: LAPAROSCOPIC VENTRAL HERNIA REPAIR WITH MESH;  Surgeon: [name], MD;  Location: [facility];  Service: General;  Laterality: N/A;   XI ROBOTIC ASSISTED COLOSTOMY TAKEDOWN N/A 03/11/2020   Procedure: ROBOTIC ASSISTED COLOSTOMY REVERSAL, RIGID PROCTOSCOPY;  Surgeon: Leighton Ruff, MD;  [facility];  Service: General;  Laterality: N/A;       Family History  Problem Relation Age of Onset   Diabetes Mother    Heart failure Mother        around 38   CAD Father        66   Melanoma Brother 69   Healthy Daughter    Healthy Son    Colon cancer Neg Hx    Esophageal cancer Neg Hx    Rectal cancer Neg Hx    Stomach cancer Neg Hx     Social History   Tobacco Use   Smoking status: Never   Smokeless tobacco: Never  Vaping Use   Vaping Use: Never used  Substance Use Topics   Alcohol use: Yes    Alcohol/week: 14.0 standard drinks    Types: 14 Standard drinks or equivalent per week   Drug use: No    Home Medications Prior to Admission medications   Medication Sig Start Date End Date Taking? Authorizing Provider  aspirin EC 81 MG tablet Take 81 mg by mouth daily. Swallow whole.   Yes [provider]  atorvastatin (LIPITOR) 80 MG tablet Take 80 mg by mouth daily.   Yes [provider]  Cholecalciferol (VITAMIN D3) 25 MCG (1000 UT) CAPS Take 1,000 Units by mouth daily.   Yes [provider]  finasteride (PROSCAR) 5 MG tablet Take 5 mg by mouth daily. 12/24/20  Yes [provider]  lisinopril (ZESTRIL) 10 MG tablet Take 10 mg by mouth daily.   Yes [provider]  loratadine (CLARITIN) 10 MG tablet Take 10 mg by mouth daily.   Yes [provider]  magnesium oxide  (MAG-OX) 400 MG tablet Take 400 mg by mouth daily.   Yes [provider]  meclizine (ANTIVERT) 25 MG tablet Take 1 tablet (25 mg total) by mouth 3 (three) times daily as needed for up to 15 doses for dizziness. 04/04/21  Yes Luna Fuse, MD  Omega-3 Fatty Acids (FISH OIL) 1200 MG CAPS Take 1,200 mg by mouth daily.   Yes [provider]  traMADol (ULTRAM) 50 MG tablet Take 1 tablet (50 mg total) by mouth every 6 (six) hours as needed. Patient not taking: No sig reported 02/03/21 02/03/22  [name], MD    Allergies    Demerol and Promethazine hcl  Review of Systems   Review of Systems  Constitutional:  Negative for fever.  HENT:  Negative for ear pain and sore throat.   Eyes:  Negative for pain.  Respiratory:  Negative for cough.   Cardiovascular:  Negative for chest pain.  Gastrointestinal:  Negative for abdominal pain.  Genitourinary:  Negative for flank pain.  Musculoskeletal:  Negative for back pain.  Skin:  Negative for color change and rash.  Neurological:  Negative for syncope.  All other systems reviewed and are negative.  Physical Exam Updated Vital Signs BP (!) 186/125   Pulse 70   Temp 98.5 F (36.9 C) (Oral)   Resp 17   SpO2 99%   Physical Exam Constitutional:      Appearance: He is well-developed.  HENT:     Head: Normocephalic.     Nose: Nose normal.  Eyes:     Extraocular Movements: Extraocular movements intact.  Cardiovascular:     Rate and Rhythm: Normal rate.  Pulmonary:     Effort: Pulmonary effort is normal.  Skin:    Coloration: Skin is not jaundiced.  Neurological:     Mental Status: He is alert. Mental status is at baseline.     Comments: Cranial nerves II through XII intact.  Strength 5/5 all extremities.  Finger-nose heel-to-shin intact.  Patient able to weight-bear by himself however gait is mildly unsteady.    ED Results / Procedures / Treatments   Labs (all labs ordered are listed, but only abnormal results are  displayed) Labs Reviewed  CBC WITH DIFFERENTIAL/PLATELET - Abnormal; Notable for the following components:      Result Value   RBC 4.17 (*)    MCV 102.9 (*)    All other components within normal limits  BASIC METABOLIC PANEL    EKG EKG Interpretation  Date/Time:  Monday April 04 2021 19:39:09 EDT Ventricular Rate:  77 PR Interval:  256 QRS Duration: 99 QT Interval:  410 QTC Calculation: 464 R Axis:   -73 Text Interpretation: Sinus or ectopic atrial rhythm Atrial premature complex Prolonged PR interval LAD, consider left anterior fascicular block RSR' in V1 or V2, right VCD or RVH Confirmed by Thamas Jaegers ([id number]) on 04/04/2021 9:18:46 PM  Radiology CT HEAD WO CONTRAST (5MM)  Result Date: 04/04/2021 CLINICAL DATA:  HEAD TRAUMA, MINOR EXAM: CT HEAD WITHOUT CONTRAST TECHNIQUE: Contiguous axial images were obtained from the base of the skull through the vertex without intravenous contrast. COMPARISON:  08/12/2013. FINDINGS: Brain: No evidence of acute infarction, hemorrhage, hydrocephalus, extra-axial collection or mass lesion/mass effect. Periventricular white matter changes, likely the sequela of chronic small vessel ischemic disease. Age-appropriate cerebral atrophy. Vascular: No hyperdense vessel or unexpected calcification. Skull: Normal. Negative for fracture or focal lesion. Sinuses/Orbits: No acute finding. Other: Increased density on the right lateral frontal scalp, likely abrasion. IMPRESSION: No acute intracranial process. Electronically Signed   By: Merilyn Baba M.D.   On: 04/04/2021 16:36   MR BRAIN WO CONTRAST  Result Date: 04/04/2021 CLINICAL DATA:  Initial evaluation for acute dizziness. EXAM: MRI HEAD WITHOUT CONTRAST TECHNIQUE: Multiplanar, multiecho pulse sequences of the brain and surrounding structures were obtained without intravenous contrast. COMPARISON:  Prior CT from earlier the same day. FINDINGS: Brain: Generalized age-related cerebral atrophy with mild chronic  small vessel ischemic disease. Few small remote left cerebellar infarcts noted. No abnormal foci of restricted diffusion to suggest acute or subacute ischemia. Gray-white matter differentiation maintained. No encephalomalacia to suggest chronic cortical infarction. No evidence for acute or chronic intracranial hemorrhage. There is an approximate 1 cm soft  tissue lesion involving the right IAC (series 11, image 11), suspicious for a small mass, possibly schwannoma. No other discrete mass lesion. No mass effect or midline shift. No hydrocephalus or extra-axial fluid collection. Pituitary gland suprasellar region within normal limits. Midline structures intact. Vascular: Right vertebral artery hypoplastic and not well seen. Major intracranial vascular flow voids are otherwise maintained. Skull and upper cervical spine: Craniocervical junction within normal limits. Bone marrow signal intensity normal. No scalp soft tissue abnormality. Sinuses/Orbits: Patient status post bilateral ocular lens replacement. Globes and orbital soft tissues demonstrate no acute finding. Paranasal sinuses are clear. No mastoid effusion. Other: None. IMPRESSION: 1. No acute intracranial abnormality. 2. 1 cm soft tissue lesion involving the right IAC, suspicious for a small mass, possibly schwannoma. Further evaluation with dedicated IAC protocol MRI, with and without contrast, suggested for further evaluation. 3. Age-related cerebral atrophy with mild chronic small vessel ischemic disease, with a few small remote left cerebellar infarcts. Electronically Signed   By: Jeannine Boga M.D.   On: 04/04/2021 21:07    Procedures Procedures   Medications Ordered in ED Medications - No data to display  ED Course  I have reviewed the triage vital signs and the nursing notes.  Pertinent labs & imaging results that were available during my care of the patient were reviewed by me and considered in my medical decision making (see chart for  details).    MDM Rules/Calculators/A&P                           MRI concerning for possible Schwannoma of right IAC.  Will recommend outpatient follow up with ENT in 3-4 days.  Advised return for worsening symptoms, fevers, pain or any other concerns.   Final Clinical Impression(s) / ED Diagnoses Final diagnoses:  Unsteady gait  Schwannoma    Rx / DC Orders ED Discharge Orders          Ordered    meclizine (ANTIVERT) 25 MG tablet  3 times daily PRN        04/04/21 2213             Luna Fuse, MD 04/04/21 2214

## 2021-04-04 NOTE — Telephone Encounter (Signed)
ERROR

## 2021-04-04 NOTE — ED Provider Notes (Signed)
Emergency Medicine Provider Triage Evaluation Note  Wesley [name] , a 80 y.o. male  was evaluated in triage.  Pt complains of head injury and dizziness.  Patient reports Saturday night he was feeding his puppies and tripped falling backwards and striking the back of his head and the right side of his head on a cabinet.  Has an abrasion to the right upper forehead.  Reports no LOC, and initially felt okay but when he got up this morning he felt dizzy and off-balance, laid back down and then again when he got up he felt dizzy and unsteady, had to use a walker to walk around.  Reports he has had some balance issues but never this severe.  Has had a mild headache associated with head injury.  No vision changes, no numbness or weakness.  No vomiting.  Takes daily aspirin, no anticoagulants.  Review of Systems  Positive: Headache, dizziness Negative: Vision changes, numbness, weakness, vomiting  Physical Exam  BP (!) 155/112   Pulse 89   Temp 98.5 F (36.9 C) (Oral)   Resp 18   SpO2 97%  Gen:   Awake, no distress   Resp:  Normal effort  MSK:   Moves extremities without difficulty  Other:  No facial droop, normal speech, moving all extremities with normal strength and sensation, no pronator drift  Medical Decision Making  Medically screening exam initiated at 3:27 PM.  Appropriate orders placed.  Wesley [name] was informed that the remainder of the evaluation will be completed by another provider, this initial triage assessment does not replace that evaluation, and the importance of remaining in the ED until their evaluation is complete.     Janet Berlin 04/04/21 1531    Luna Fuse, MD 04/04/21 [phone]

## 2021-04-04 NOTE — Telephone Encounter (Signed)
Ok to schedule with another provider with an opening.

## 2021-04-04 NOTE — Discharge Instructions (Addendum)
Your MRI was concerning for a possible Schwannoma as a possible cause for your dizziness.  Call ENT and follow up in the 3-4 days.   Return immediately back to the ER if:  Your symptoms worsen within the next 12-24 hours. You develop new symptoms such as new fevers, persistent vomiting, new pain, shortness of breath, or new weakness or numbness, or if you have any other concerns.

## 2021-04-04 NOTE — ED Triage Notes (Signed)
C/o dizziness with headache after a fall. \   Pt reports Saturday night he was feeding his puppies and tripped, falling back, and clipping the cabinet with his head.  Abrasion to right upper forehead.   Reports dizziness at 5am this morning. Patient got back up again at 7am and had dizziness again. Pt reports feeling unsteady on his feet and had to use a walker. Patient does not normally use a walker.   Pt called 911. EMS did EKG and VS.  EMS: BP-130/82, HR-68, CBG-163, 97% RA Pt did not go to the ED at this time.     Denies blood thinners Takes 81 mg aspirin daily.

## 2021-04-04 NOTE — Telephone Encounter (Signed)
Noted  

## 2021-04-04 NOTE — Telephone Encounter (Signed)
Called patient and he states EMS came out, took vitals and everything seemed to be in normal range but advised patient to go to the hospital so they are able to access him better and run more tests. Wesley [name] states they are getting some stuff together and then his wife will drive him to the nearest hospital.

## 2021-04-05 ENCOUNTER — Telehealth: Payer: Self-pay

## 2021-04-05 ENCOUNTER — Encounter: Payer: Self-pay | Admitting: Family Medicine

## 2021-04-05 ENCOUNTER — Encounter: Payer: Medicare Other | Admitting: Physical Therapy

## 2021-04-05 NOTE — Telephone Encounter (Signed)
Patient was seen yesterday at ED and was referred to another dr and he just wanted to make sure this was a good plan of care for him  Please advise

## 2021-04-06 NOTE — Telephone Encounter (Signed)
Patient seen at ER for vertigo and was given meclizine and ENT referral. States that a schwannoma was found during brain MRI - was not told any further information while at the ER. Concerns on what to do next.

## 2021-04-06 NOTE — Telephone Encounter (Signed)
Lets schedule a visit to discuss findings and order next steps

## 2021-04-07 ENCOUNTER — Telehealth: Payer: Self-pay

## 2021-04-07 ENCOUNTER — Encounter: Payer: Medicare Other | Admitting: Physical Therapy

## 2021-04-07 DIAGNOSIS — D333 Benign neoplasm of cranial nerves: Secondary | ICD-10-CM

## 2021-04-07 NOTE — Telephone Encounter (Signed)
.   Encourage patient to contact the pharmacy for refills or they can request refills through [place]:  Please schedule appointment if longer than 1 year  NEXT APPOINTMENT DATE:  MEDICATION:meclizine (ANTIVERT) 25 MG tablet  Is the patient out of medication?   [place], [place]    Patient would like this refilled just until appt on 8/29

## 2021-04-07 NOTE — Telephone Encounter (Signed)
Patient was seen at ED and he was referred to ENT and he called that office and the next appt is not until oct 10th  He would like to see if he can be sent somewhere he can get in sooner

## 2021-04-07 NOTE — Telephone Encounter (Signed)
Patient is scheduled for 04/11/21 at 2:20pm.

## 2021-04-08 ENCOUNTER — Other Ambulatory Visit: Payer: Self-pay

## 2021-04-08 ENCOUNTER — Encounter (HOSPITAL_COMMUNITY): Payer: Self-pay | Admitting: Emergency Medicine

## 2021-04-08 ENCOUNTER — Emergency Department (HOSPITAL_COMMUNITY): Payer: Medicare Other

## 2021-04-08 ENCOUNTER — Emergency Department (HOSPITAL_COMMUNITY)
Admission: EM | Admit: 2021-04-08 | Discharge: 2021-04-08 | Disposition: A | Discharge status: Home / Self Care | Payer: Medicare Other | Attending: Emergency Medicine | Admitting: Emergency Medicine

## 2021-04-08 DIAGNOSIS — Z20822 Contact with and (suspected) exposure to covid-19: Secondary | ICD-10-CM | POA: Insufficient documentation

## 2021-04-08 DIAGNOSIS — Z85828 Personal history of other malignant neoplasm of skin: Secondary | ICD-10-CM | POA: Diagnosis not present

## 2021-04-08 DIAGNOSIS — Z7982 Long term (current) use of aspirin: Secondary | ICD-10-CM | POA: Insufficient documentation

## 2021-04-08 DIAGNOSIS — S060X0D Concussion without loss of consciousness, subsequent encounter: Secondary | ICD-10-CM | POA: Diagnosis not present

## 2021-04-08 DIAGNOSIS — R42 Dizziness and giddiness: Secondary | ICD-10-CM | POA: Diagnosis not present

## 2021-04-08 DIAGNOSIS — I2510 Atherosclerotic heart disease of native coronary artery without angina pectoris: Secondary | ICD-10-CM | POA: Diagnosis not present

## 2021-04-08 DIAGNOSIS — Y900 Blood alcohol level of less than 20 mg/100 ml: Secondary | ICD-10-CM | POA: Diagnosis not present

## 2021-04-08 DIAGNOSIS — S060X0A Concussion without loss of consciousness, initial encounter: Secondary | ICD-10-CM | POA: Diagnosis not present

## 2021-04-08 DIAGNOSIS — I10 Essential (primary) hypertension: Secondary | ICD-10-CM | POA: Diagnosis not present

## 2021-04-08 DIAGNOSIS — W01198D Fall on same level from slipping, tripping and stumbling with subsequent striking against other object, subsequent encounter: Secondary | ICD-10-CM | POA: Diagnosis not present

## 2021-04-08 DIAGNOSIS — W19XXXA Unspecified fall, initial encounter: Secondary | ICD-10-CM | POA: Diagnosis not present

## 2021-04-08 DIAGNOSIS — G9389 Other specified disorders of brain: Secondary | ICD-10-CM | POA: Diagnosis not present

## 2021-04-08 DIAGNOSIS — Z79899 Other long term (current) drug therapy: Secondary | ICD-10-CM | POA: Diagnosis not present

## 2021-04-08 DIAGNOSIS — Z789 Other specified health status: Secondary | ICD-10-CM

## 2021-04-08 DIAGNOSIS — Z743 Need for continuous supervision: Secondary | ICD-10-CM | POA: Diagnosis not present

## 2021-04-08 DIAGNOSIS — R2689 Other abnormalities of gait and mobility: Secondary | ICD-10-CM | POA: Insufficient documentation

## 2021-04-08 DIAGNOSIS — R6889 Other general symptoms and signs: Secondary | ICD-10-CM | POA: Diagnosis not present

## 2021-04-08 DIAGNOSIS — R404 Transient alteration of awareness: Secondary | ICD-10-CM | POA: Diagnosis not present

## 2021-04-08 DIAGNOSIS — I4510 Unspecified right bundle-branch block: Secondary | ICD-10-CM | POA: Diagnosis not present

## 2021-04-08 DIAGNOSIS — S0990XD Unspecified injury of head, subsequent encounter: Secondary | ICD-10-CM | POA: Diagnosis present

## 2021-04-08 LAB — CBC WITH DIFFERENTIAL/PLATELET
Abs Immature Granulocytes: 0.04 10*3/uL (ref 0.00–0.07)
Basophils Absolute: 0 10*3/uL (ref 0.0–0.1)
Basophils Relative: 1 %
Eosinophils Absolute: 0.1 10*3/uL (ref 0.0–0.5)
Eosinophils Relative: 3 %
HCT: 43.6 % (ref 39.0–52.0)
Hemoglobin: 14.3 g/dL (ref 13.0–17.0)
Immature Granulocytes: 1 %
Lymphocytes Relative: 34 %
Lymphs Abs: 1.8 10*3/uL (ref 0.7–4.0)
MCH: 34 pg (ref 26.0–34.0)
MCHC: 32.8 g/dL (ref 30.0–36.0)
MCV: 103.8 fL — ABNORMAL HIGH (ref 80.0–100.0)
Monocytes Absolute: 1.1 10*3/uL — ABNORMAL HIGH (ref 0.1–1.0)
Monocytes Relative: 20 %
Neutro Abs: 2.1 10*3/uL (ref 1.7–7.7)
Neutrophils Relative %: 41 %
Platelets: 168 10*3/uL (ref 150–400)
RBC: 4.2 MIL/uL — ABNORMAL LOW (ref 4.22–5.81)
RDW: 15 % (ref 11.5–15.5)
WBC: 5.1 10*3/uL (ref 4.0–10.5)
nRBC: 0 % (ref 0.0–0.2)

## 2021-04-08 LAB — COMPREHENSIVE METABOLIC PANEL
ALT: 34 U/L (ref 0–44)
AST: 35 U/L (ref 15–41)
Albumin: 3 g/dL — ABNORMAL LOW (ref 3.5–5.0)
Alkaline Phosphatase: 70 U/L (ref 38–126)
Anion gap: 5 (ref 5–15)
BUN: 18 mg/dL (ref 8–23)
CO2: 25 mmol/L (ref 22–32)
Calcium: 8.8 mg/dL — ABNORMAL LOW (ref 8.9–10.3)
Chloride: 103 mmol/L (ref 98–111)
Creatinine, Ser: 0.9 mg/dL (ref 0.61–1.24)
GFR, Estimated: >60 mL/min (ref >60)
Glucose, Bld: 92 mg/dL (ref 70–99)
Potassium: 4.3 mmol/L (ref 3.5–5.1)
Sodium: 133 mmol/L — ABNORMAL LOW (ref 135–145)
Total Bilirubin: 0.9 mg/dL (ref 0.3–1.2)
Total Protein: 10.1 g/dL — ABNORMAL HIGH (ref 6.5–8.1)

## 2021-04-08 LAB — ETHANOL: Alcohol, Ethyl (B): <10 mg/dL (ref <10)

## 2021-04-08 LAB — RESP PANEL BY RT-PCR (FLU A&B, COVID) ARPGX2
Influenza A by PCR: NEGATIVE
Influenza B by PCR: NEGATIVE
SARS Coronavirus 2 by RT PCR: NEGATIVE

## 2021-04-08 LAB — TSH: TSH: 1.984 u[IU]/mL (ref 0.350–4.500)

## 2021-04-08 IMAGING — MR MR HEAD WO/W CM
8 of 15 series · 24 of 48 positions shown · IV contrast (gadavist)
Comparison: Brain MRI [DATE]

CLINICAL DATA: Dizziness

EXAM:
MRI HEAD WITHOUT AND WITH CONTRAST
TECHNIQUE: Multiplanar, multiecho pulse sequences of the brain and surrounding
structures were obtained without and with intravenous contrast.
CONTRAST:  8mL GADAVIST GADOBUTROL 1 MMOL/ML IV SOLN

[Series 2: DWI · axial · 3.0mm · 0.94mm/px · z∈[-73,+92]mm · 8 of 112 slices shown (1 of 2)]
[im 1/112]
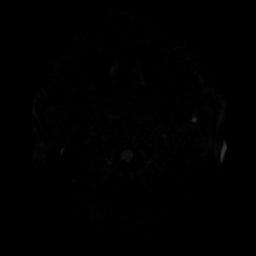
[im 16/112]
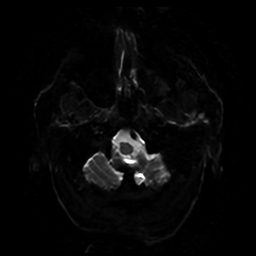
[im 32/112]
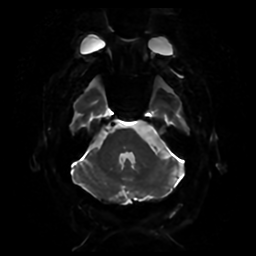
[im 48/112]
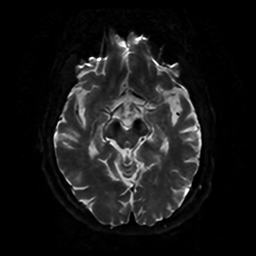
[im 64/112]
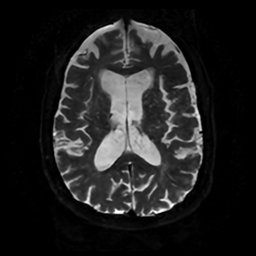
[im 80/112]
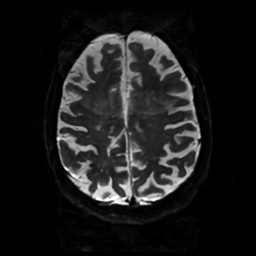
[im 96/112]
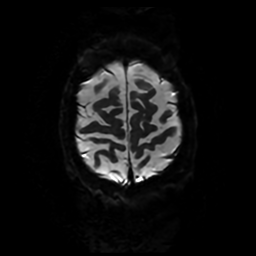
[im 112/112]
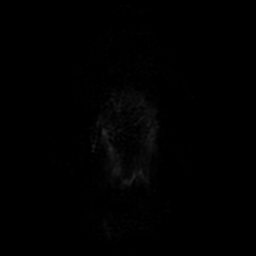

[Series 3: DWI · coronal · 4.0mm · 0.94mm/px · 5 of 84 slices shown (2 of 2)]
[im 1/84]
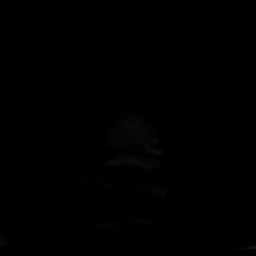
[im 21/84]
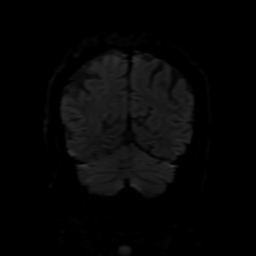
[im 42/84]
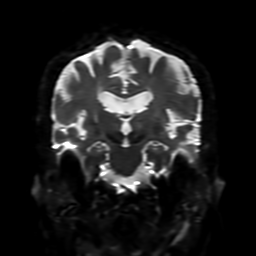
[im 63/84]
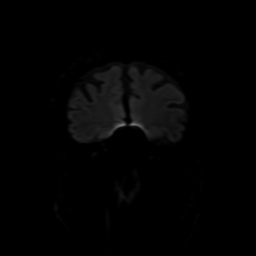
[im 84/84]
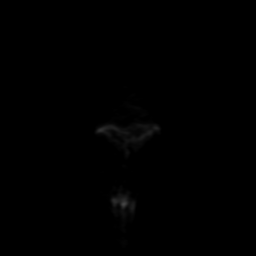

[Series 4: FLAIR · sagittal · 5.0mm · 0.23mm/px · 2 of 25 slices shown (1 of 2)]
[im 1/25]
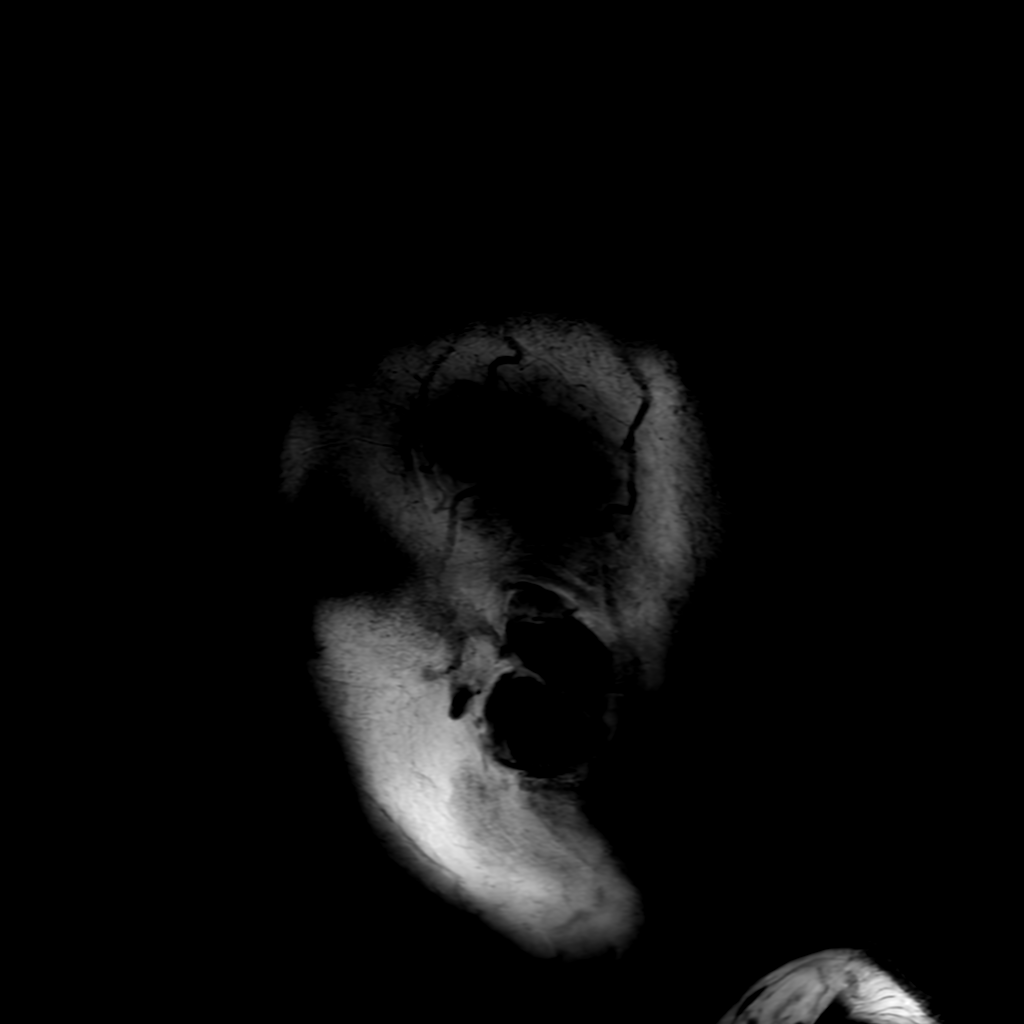
[im 25/25]
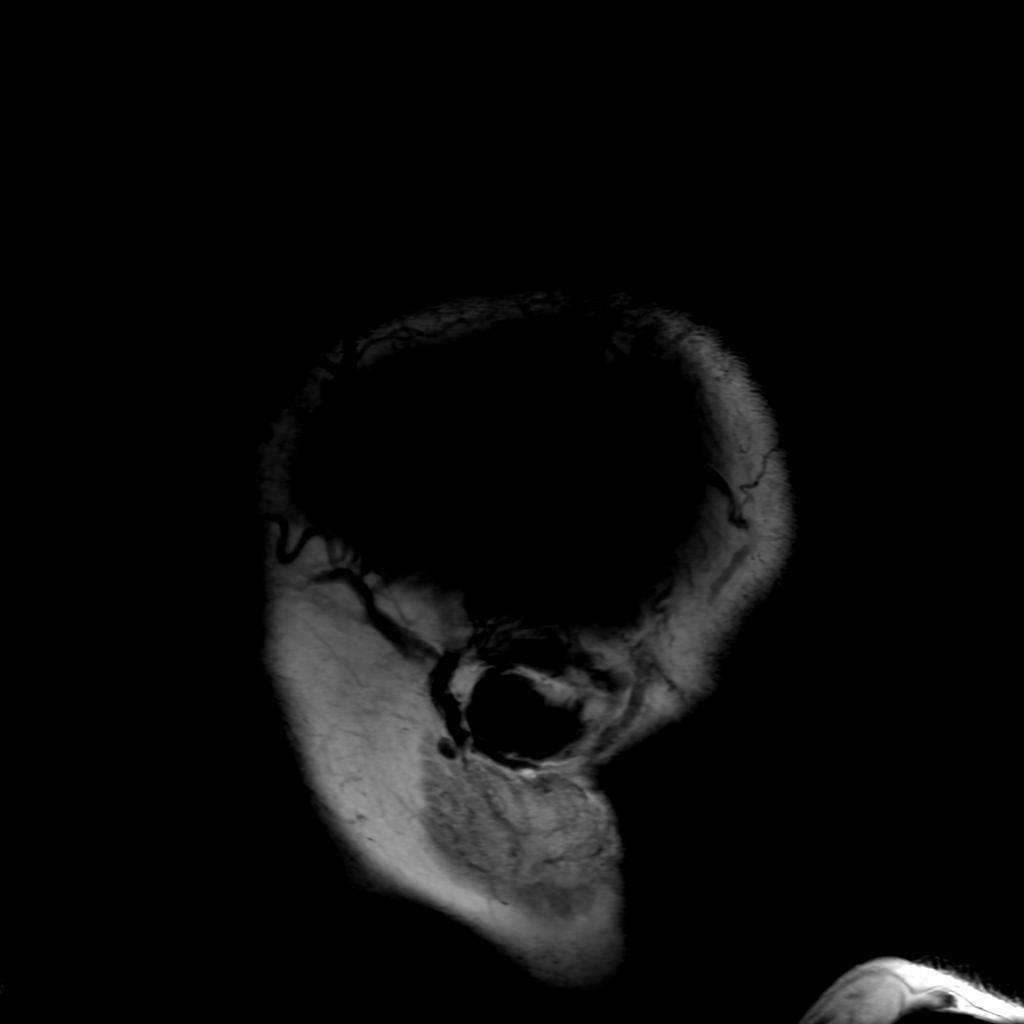

[Series 6: FLAIR · axial · 4.0mm · 0.45mm/px · z∈[-73,+90]mm · 2 of 38 slices shown (2 of 2)]
[im 1/38]
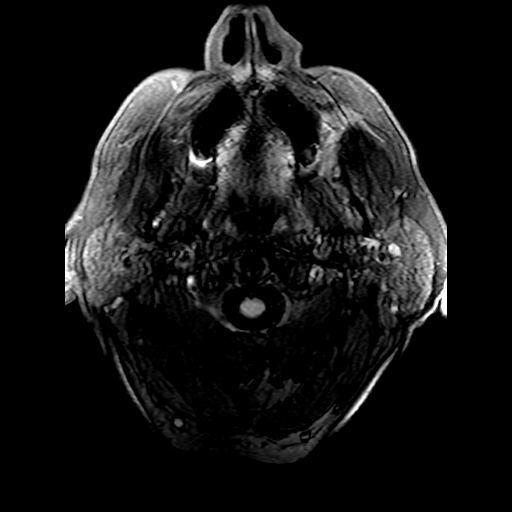
[im 38/38]
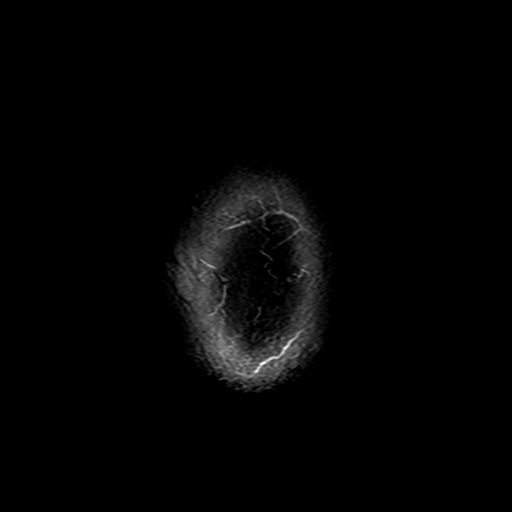

[Series 13: T1 post-contrast · axial · 3.0mm · 0.35mm/px · 1 of 17 slices shown (1 of 2)]
[im 1/17]
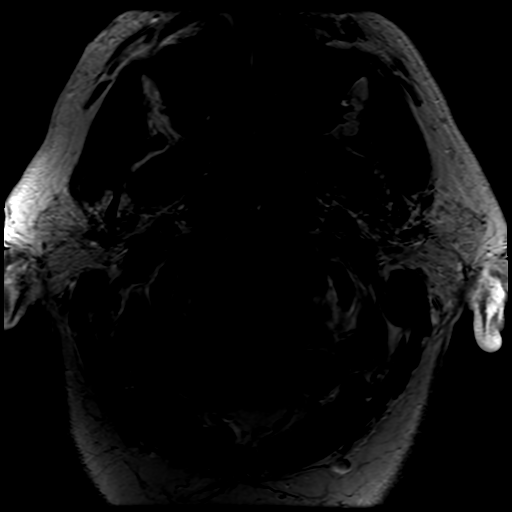

[Series 14: T1 post-contrast · coronal · 3.0mm · 0.35mm/px · 1 of 19 slices shown (2 of 2)]
[im 1/19]
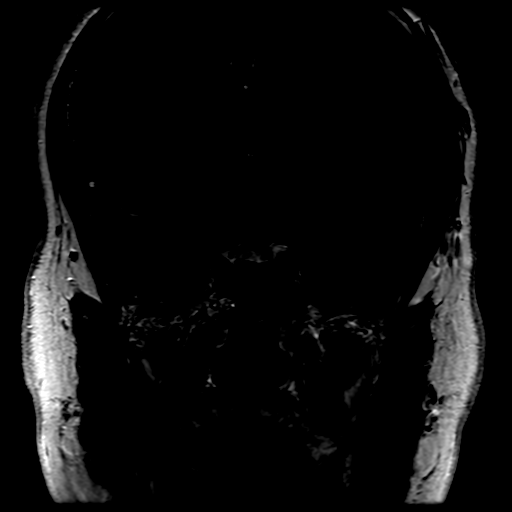

[Series 250: ADC · axial · 3.0mm · 0.94mm/px · z∈[-73,+92]mm · 3 of 56 slices shown (1 of 2)]
[im 1/56]
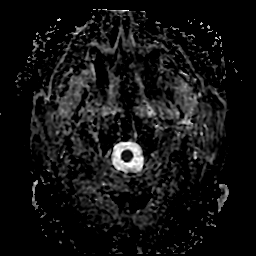
[im 28/56]
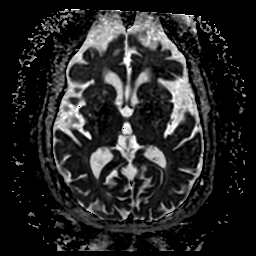
[im 56/56]
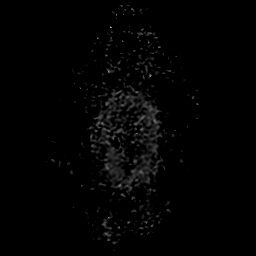

[Series 350: ADC · coronal · 4.0mm · 0.94mm/px · 2 of 41 slices shown (2 of 2)]
[im 1/41]
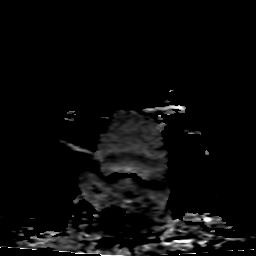
[im 41/41]
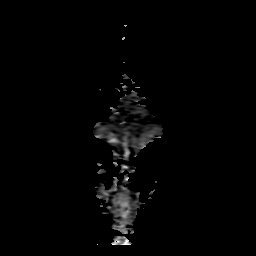

[24 of 48 positions shown; findings below may reference images not displayed]

FINDINGS: Brain: Scattered foci of FLAIR signal hyperintensity are again seen
in the subcortical and periventricular white matter likely
reflecting sequela of chronic white matter microangiopathy. Global
parenchymal volume loss with commensurate enlargement of the
ventricular system is unchanged. Remote infarcts in the left
cerebellar hemisphere are unchanged. There is no evidence of acute
intracranial hemorrhage or infarct.

There is a 7 mm by 5 mm enhancing lesion in the right IAC. There is
no evidence of extension into the cochlea.

The left IAC is unremarkable.

There is no other abnormal enhancement.

Vascular: Normal flow voids.

Skull and upper cervical spine: Normal marrow signal.

Sinuses/Orbits: Bilateral lens implants are noted. The globes and
orbits are otherwise unremarkable. The paranasal sinuses and mastoid
air cells are clear.

Other: None.
IMPRESSION: 7 mm x 5 mm vestibular schwannoma in the right IAC.

## 2021-04-08 MED ORDER — GADOBUTROL 1 MMOL/ML IV SOLN
8.0000 mL | Freq: Once | INTRAVENOUS | Status: AC | PRN
  Administered 2021-04-08: 8 mL via INTRAVENOUS

## 2021-04-08 MED ORDER — LORAZEPAM 1 MG PO TABS
0.5000 mg | ORAL_TABLET | Freq: Once | ORAL | Status: AC
  Administered 2021-04-08: 0.5 mg via ORAL
  Filled 2021-04-08: qty 1

## 2021-04-08 MED ORDER — IBUPROFEN 400 MG PO TABS
400.0000 mg | ORAL_TABLET | Freq: Once | ORAL | Status: AC
  Administered 2021-04-08: 400 mg via ORAL
  Filled 2021-04-08: qty 1

## 2021-04-08 MED ORDER — SODIUM CHLORIDE 0.9 % IV BOLUS
500.0000 mL | Freq: Once | INTRAVENOUS | Status: AC
  Administered 2021-04-08: 500 mL via INTRAVENOUS

## 2021-04-08 MED ORDER — MECLIZINE HCL 25 MG PO TABS: 25.0000 mg | ORAL_TABLET | Freq: Three times a day (TID) | ORAL | 0 refills | Status: DC | PRN

## 2021-04-08 MED ORDER — LORAZEPAM 0.5 MG PO TABS: 0.5000 mg | ORAL_TABLET | Freq: Three times a day (TID) | ORAL | 0 refills | Status: DC | PRN

## 2021-04-08 NOTE — Telephone Encounter (Signed)
I reviewed the ER notes and it states referral to ENT but does not say what he needed to be seen for.Ok to place a new referral to another location and what is he needing to go for?

## 2021-04-08 NOTE — ED Notes (Addendum)
Pt to MRI

## 2021-04-08 NOTE — ED Provider Notes (Signed)
Pt signed out by Dr. Reather Converse.  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  Pt d/w Dr. Christella Noa (NS).  He did not feel that there was any emergent need for surgery.  He does not think this is the cause of sx as pt's sx.  Pt to f/u as an outpatient.  Pt given 0.5 mg ativan po and he feels better.  He is able to ambulate with his walker without problems.  He is given the option of staying, but prefers to go home.  He has an appt with his pcp on Monday, 8/29.  Pt knows to return if worse.      Isla Pence, MD 04/08/21 [phone]

## 2021-04-08 NOTE — ED Triage Notes (Signed)
Pt to ER via EMS from home with c/o increasing dizziness for last week.  Pt reports fell last Friday night hitting head on furniture.  Pt reports on Monday awoke to dizziness, and was seen at [facility].  Pt reports that the dizziness has gotten worse through the week and is bad enough today that he is unable to ambulate even with his walker.  Pt denies n/v with dizziness.

## 2021-04-08 NOTE — ED Provider Notes (Signed)
[facility] EMERGENCY DEPARTMENT Provider Note   CSN: [id number] Arrival date & time: 04/08/21  1249     History Chief Complaint  Patient presents with   Dizziness    Wesley [name] is a 80 y.o. male.  Patient with history of coronary artery disease, reflux, sleep apnea, vitamin B12 deficiency, alcohol use daily presents with worsening dizziness and balance issues since earlier this week.  Patient was seen in [facility] emergency room on Monday for dizziness after a fall.  Patient had a mechanical fall which led to him hitting the right side of his head.  He patient is not on any anticoagulation.  Patient reports since being home he has had gradually worsening balance issues and dizziness.  Patient denies any vomiting, diarrhea or blood in the stools.  No chest pain or shortness of breath.  Patient's had headache on that side intermittent.      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   Sleep apnea    Umbilical hernia without obstruction or gangrene     Patient Active Problem List   Diagnosis Date Noted   B12 deficiency 01/06/2021   Vitamin D deficiency 01/06/2021   Aortic atherosclerosis ([place] 01/06/2021   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[place] 01/06/2021   Allergic rhinitis 01/06/2021   BPH associated with nocturia 01/06/2021   Abdominal wall abscess 10/28/2020   S/P hernia repair 10/13/2020   Osteoarthritis of glenohumeral joint, left  09/21/2017   Non-traumatic rotator cuff tear 09/21/2017   CAD (coronary artery disease) 08/12/2013   Essential hypertension 03/15/2011   GERD (gastroesophageal reflux disease) 03/15/2011    Past Surgical History:  Procedure Laterality Date   BIOPSY  10/29/2019   Procedure: BIOPSY;  Surgeon: PJerene Bears MD;  [facility];  Service: Gastroenterology;;   CARDIAC CATHETERIZATION  2000   Cath to rule out cardiac problems RT HTN. PT denies significant findings   CARDIAC CATHETERIZATION  2008   COLOSTOMY N/A 10/31/2019   Procedure: COLOSTOMY;  Surgeon: CErroll Luna MD;  [facility]: [facility];  Service: General;  Laterality: N/A;   CYSTOSCOPY W/ URETERAL STENT PLACEMENT Bilateral 10/31/2019   Procedure: CYSTOSCOPY URETERAL STENT PLACEMENT BILATERAL;  Surgeon: DFranchot Gallo MD;  [facility];  Service: Urology;  Laterality: Bilateral;   CYSTOSCOPY WITH STENT PLACEMENT Bilateral 03/11/2020   Procedure: CYSTOSCOPY WITH BILATERAL FIREFLY INJECTION;  Surgeon: WIrine Seal MD;  [facility];  Service: Urology;  Laterality: Bilateral;   EYE SURGERY     BILATERAL CATARACT SURGERY WITH LENS IMPLANTS   FLEXIBLE SIGMOIDOSCOPY N/A 10/29/2019   Procedure: FLEXIBLE SIGMOIDOSCOPY;  Surgeon: PJerene Bears MD;  [facility];  Service: Gastroenterology;  Laterality: N/A;   INCISIONAL HERNIA REPAIR N/A 10/13/2020   Procedure: OPEN INCISIONAL HERNIA REPAIR WITH MESH;  Surgeon: RRalene Ok MD;  [facility]  Service: General;  Laterality: N/A;  INSERTION OF MESH N/A 09/08/2016   Procedure: INSERTION OF MESH;  Surgeon: Jackolyn Confer, MD;  [facility];  Service: General;  Laterality: N/A;   IR RADIOLOGIST EVAL & MGMT  11/10/2020   IR RADIOLOGIST EVAL & MGMT  11/25/2020   LAPAROSCOPIC SIGMOID COLECTOMY N/A 10/31/2019   Procedure: DIAGNOSTIC LAPAROSCOPY; EXPLORATORY LAPAROTOMY; SIGMOID COLECTOMY;  Surgeon: Erroll Luna, MD;  [facility];  Service: General;   Laterality: N/A;   right rotator cuff     SUBMUCOSAL TATTOO INJECTION  10/29/2019   Procedure: SUBMUCOSAL TATTOO INJECTION;  Surgeon: Jerene Bears, MD;  [facility];  Service: Gastroenterology;;   TONSILLECTOMY     UMBILICAL HERNIA REPAIR N/A 09/08/2016   Procedure: OPEN UMBILICAL HERNIA REPAIR WITH MESH;  Surgeon: Jackolyn Confer, MD;  [facility];  Service: General;  Laterality: N/A;   VENTRAL HERNIA REPAIR N/A 02/03/2021   Procedure: LAPAROSCOPIC VENTRAL HERNIA REPAIR WITH MESH;  Surgeon: [name], MD;  Location: [facility];  Service: General;  Laterality: N/A;   XI ROBOTIC ASSISTED COLOSTOMY TAKEDOWN N/A 03/11/2020   Procedure: ROBOTIC ASSISTED COLOSTOMY REVERSAL, RIGID PROCTOSCOPY;  Surgeon: Leighton Ruff, MD;  [facility];  Service: General;  Laterality: N/A;       Family History  Problem Relation Age of Onset   Diabetes Mother    Heart failure Mother        around 68   CAD Father        32   Melanoma Brother 39   Healthy Daughter    Healthy Son    Colon cancer Neg Hx    Esophageal cancer Neg Hx    Rectal cancer Neg Hx    Stomach cancer Neg Hx     Social History   Tobacco Use   Smoking status: Never   Smokeless tobacco: Never  Vaping Use   Vaping Use: Never used  Substance Use Topics   Alcohol use: Yes    Alcohol/week: 14.0 standard drinks    Types: 14 Standard drinks or equivalent per week   Drug use: No    Home Medications Prior to Admission medications   Medication Sig Start Date End Date Taking? Authorizing Provider  aspirin EC 81 MG tablet Take 81 mg by mouth daily. Swallow whole.    [provider]  atorvastatin (LIPITOR) 80 MG tablet Take 80 mg by mouth daily.    [provider]  Cholecalciferol (VITAMIN D3) 25 MCG (1000 UT) CAPS Take 1,000 Units by mouth daily.    [provider]  finasteride (PROSCAR) 5 MG tablet Take 5 mg by mouth daily. 12/24/20   [provider]  lisinopril (ZESTRIL) 10 MG  tablet Take 10 mg by mouth daily.    [provider]  loratadine (CLARITIN) 10 MG tablet Take 10 mg by mouth daily.    [provider]  magnesium oxide (MAG-OX) 400 MG tablet Take 400 mg by mouth daily.    [provider]  meclizine (ANTIVERT) 25 MG tablet Take 1 tablet (25 mg total) by mouth 3 (three) times daily as needed for up to 15 doses for dizziness. 04/08/21   Marin Olp, MD  Omega-3 Fatty Acids (FISH OIL) 1200 MG CAPS Take 1,200 mg by mouth daily.    [provider]  traMADol (ULTRAM) 50 MG tablet Take 1 tablet (50 mg total) by mouth every 6 (six) hours as needed. Patient not taking: No sig reported 02/03/21 02/03/22  [name], MD  Allergies    Demerol and Promethazine hcl  Review of Systems   Review of Systems  Constitutional:  Negative for chills and fever.  HENT:  Negative for congestion.   Eyes:  Negative for visual disturbance.  Respiratory:  Negative for shortness of breath.   Cardiovascular:  Negative for chest pain.  Gastrointestinal:  Negative for abdominal pain and vomiting.  Genitourinary:  Negative for dysuria and flank pain.  Musculoskeletal:  Negative for back pain, neck pain and neck stiffness.  Skin:  Negative for rash.  Neurological:  Positive for dizziness, weakness and light-headedness. Negative for headaches.   Physical Exam Updated Vital Signs BP (!) 153/109   Pulse 89   Temp 98.6 F (37 C)   Resp 17   Ht '5\' 9"'$  (1.753 m)   Wt 83.9 kg   SpO2 97%   BMI 27.32 kg/m   Physical Exam Vitals and nursing note reviewed.  Constitutional:      General: He is not in acute distress.    Appearance: He is well-developed.  HENT:     Head: Normocephalic and atraumatic.     Mouth/Throat:     Mouth: Mucous membranes are dry.  Eyes:     General:        Right eye: No discharge.        Left eye: No discharge.     Conjunctiva/sclera: Conjunctivae normal.  Neck:     Trachea: No tracheal deviation.   Cardiovascular:     Rate and Rhythm: Normal rate and regular rhythm.     Heart sounds: No murmur heard. Pulmonary:     Effort: Pulmonary effort is normal.     Breath sounds: Normal breath sounds.  Abdominal:     General: There is no distension.     Palpations: Abdomen is soft.     Tenderness: There is no abdominal tenderness. There is no guarding.  Musculoskeletal:        General: Normal range of motion.     Cervical back: Normal range of motion and neck supple. No rigidity.  Skin:    General: Skin is warm.     Capillary Refill: Capillary refill takes less than 2 seconds.     Findings: No rash.  Neurological:     General: No focal deficit present.     Mental Status: He is alert.     Cranial Nerves: No cranial nerve deficit.     Comments: Patient has no nystagmus, visual fields intact, finger-nose intact bilateral without dysmetria, heel-to-shin bilateral without problems.  Sensation intact bilateral palpation.  Mild general weakness but no focality upper or lower extremities.  Psychiatric:        Mood and Affect: Mood normal.    ED Results / Procedures / Treatments   Labs (all labs ordered are listed, but only abnormal results are displayed) Labs Reviewed  COMPREHENSIVE METABOLIC PANEL - Abnormal; Notable for the following components:      Result Value   Sodium 133 (*)    Calcium 8.8 (*)    Total Protein 10.1 (*)    Albumin 3.0 (*)    All other components within normal limits  CBC WITH DIFFERENTIAL/PLATELET - Abnormal; Notable for the following components:   RBC 4.20 (*)    MCV 103.8 (*)    Monocytes Absolute 1.1 (*)    All other components within normal limits  RESP PANEL BY RT-PCR (FLU A&B, COVID) [id number]  ETHANOL  TSH    EKG EKG Interpretation  Date/Time:  Friday April 08 2021 12:51:20 EDT Ventricular Rate:  87 PR Interval:  255 QRS Duration: 101 QT Interval:  373 QTC Calculation: 449 R Axis:   -61 Text Interpretation: Sinus rhythm Prolonged PR interval  Incomplete RBBB and LAFB RSR' in V1 or V2, right VCD or RVH Confirmed by Elnora Morrison [phone]) on 04/08/2021 1:25:25 PM  Radiology No results found.  Procedures Procedures   Medications Ordered in ED Medications  sodium chloride 0.9 % bolus 500 mL (0 mLs Intravenous Stopped 04/08/21 1529)  ibuprofen (ADVIL) tablet 400 mg (400 mg Oral Given 04/08/21 1358)  gadobutrol (GADAVIST) 1 MMOL/ML injection 8 mL (8 mLs Intravenous Contrast Given 04/08/21 1508)    ED Course  I have reviewed the triage vital signs and the nursing notes.  Pertinent labs & imaging results that were available during my care of the patient were reviewed by me and considered in my medical decision making (see chart for details).    MDM Rules/Calculators/A&P                           Patient presents with worsening dizziness and frequent falls since head injury.  Differential includes symptoms secondary to concussion, vitamin B12 related, dehydration, alcohol related, posterior stroke, other.  Plan for repeat blood work, IV fluid bolus.  For headache patient requesting NSAIDs.  Reviewed medical records and patient had MRI recently without bleed or fracture however had a 1 cm lesion in the recommended with contrast.  While patient is waiting I did order that to be done.  Plan for ambulation and to see how patient does with consult to care management.  Blood work reviewed reassuring.  Patient care signed out to follow-up MRI results, ambulate and consider admission.    Final Clinical Impression(s) / ED Diagnoses Final diagnoses:  Dizziness  Concussion without loss of consciousness, subsequent encounter  Poor tolerance for ambulation    Rx / DC Orders ED Discharge Orders     None        Elnora Morrison, MD 04/08/21 1537

## 2021-04-08 NOTE — ED Notes (Signed)
Patient transported to MRI 

## 2021-04-08 NOTE — ED Notes (Signed)
Pt ambulatory in hall with walker.  Steady gait, pt verbalizes no c/o, states he is feeling better than earlier today.

## 2021-04-08 NOTE — Telephone Encounter (Signed)
You may refer to another ENT under vestibular schwannoma-may order as stat.  I am honestly not sure if we will be able to get him in-since Dr. Lucia Gaskins is retiring I would request he not be sent there-possibly Dr. Benjamine Mola or [facility] located in [facility]

## 2021-04-08 NOTE — Progress Notes (Signed)
Phone [phone] In person visit   Subjective:   Wesley [name] is a 80 y.o. year old very pleasant male patient who presents for/with See problem oriented charting Chief Complaint  Patient presents with   Fall    Patient states that he had a fall. Was seen in the ED Monday and then again Friday.  Patient states that he feels better now, not as dizzy as he was. Patients wife states that after his fall his BP was elevated several different times. She states that it was even as high as 195/100   This visit occurred during the SARS-CoV-2 public health emergency.  Safety protocols were in place, including screening questions prior to the visit, additional usage of staff PPE, and extensive cleaning of exam room while observing appropriate contact time as indicated for disinfecting solutions.   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 Swallow whole.     Cholecalciferol (VITAMIN D3) 25  MCG (1000 UT) CAPS Take 1,000 Units by mouth daily.     finasteride (PROSCAR) 5 MG tablet Take 5 mg by mouth daily.     lisinopril (ZESTRIL) 10 MG tablet Take 10 mg by mouth daily.     loratadine (CLARITIN) 10 MG tablet Take 10 mg by mouth daily.     LORazepam (ATIVAN) 0.5 MG tablet Take 1 tablet (0.5 mg total) by mouth every 8 (eight) hours as needed for anxiety. 15 tablet 0   magnesium oxide (MAG-OX) 400 MG tablet Take 400 mg by mouth daily.     meclizine (ANTIVERT) 25 MG tablet Take 1 tablet (25 mg total) by mouth 3 (three) times daily as needed for up to 15 doses for dizziness. 15 tablet 0   Omega-3 Fatty Acids (FISH OIL) 1200 MG CAPS Take 1,200 mg by mouth daily.     atorvastatin (LIPITOR) 80 MG tablet TAKE 1 TABLET EVERY DAY 90 tablet 2   No current facility-administered medications for this visit.     Objective:  BP 134/88   Pulse 76   Temp 98.4 F (36.9 C) (Temporal)   Ht '5\' 7"'$  (1.702 m)   Wt 190 lb 3.2 oz (86.3 kg)   SpO2 96%   BMI 29.79 kg/m  Gen: NAD, resting comfortably CV: RRR no murmurs rubs or gallops Lungs: CTAB no crackles, wheeze, rhonchi Ext: no edema Skin: warm, dry    Assessment and Plan  #  ED F/U for dizziness/fall- possible BPPV but also found to have... #Vestibular Schwannoma  S: Patient with history of abdominal wall abscess after hernia repair early March 2022-later we referred him to outpatient physical therapy to help regain strength after surgery-he has been seeing them through mid August  Patient was seen by Marlaine Hind on 04/04/2021 for dizziness and fall 2 days prior. He stated that he fell 2 days ago from visit and hit his head on furniture. He was not feeling dizzy/off balance to this but describes mechanical fall. No LOC reported. He denied any headache, pain, neck pain or back pain or chest pains  Developed dizziness the day after fall and hitting head. He felt a room spinning sensation and felt very unsteady resorting to  using a walker. He also denied any sensation of weakness or numbness.  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 While patient was in the emergency room updated MRI of brain with and without contrast-7 mm x 5 mm vestibular schwannoma-this was discussed with Dr. Christella Noa of neurosurgery who did not think this needed emergent surgery. In fact after visit patient was called to be set up with interventional radiology visit it sounds like - patient states he was somewhat steered toward radiology intervention instead of neurosurgery.   He did not think it was the cause of patient's symptoms.  Patient was given Ativan and symptoms improved-was able to ambulate without his walker without difficulty- patient states this helps him more than meclizine. Marland Kitchen  He was given the option of being monitored but he preferred to return home and follow-up with [place] in the office. He has had some incontinence with lorazepam.   Patient reached out to [place] and we ordered an urgent referral to Dr. Benjamine Mola as the original office he was referred to could not see him until October 10 with [facility]NT Dr. Constance Holster formerly [facility].   From initial MRI "2. 1 cm soft tissue lesion involving the right IAC, suspicious for a small mass, possibly schwannoma. Further evaluation with dedicated IAC protocol MRI, with and without contrast, suggested for further evaluation. 3. Age-related cerebral atrophy with mild chronic small vessel ischemic disease, with a few small remote left cerebellar infarcts.  " From 2nd MRI "There is a 7 mm by 5 mm enhancing lesion in the right IAC. There is no evidence of extension into the cochlea." A/P: 80 year old male with vertigo with vestibular schwannoma not impinging on  cochlea at this time (and no hearing or tinnitus symptoms) not obviously the etiology of his vertigo. Patient describes position changes triggering his vertigo- possible BPPV. Even if he had a positive dix hallpike I am not sure this would change management- we still need to address his symptoms as well as schwannoma.  - 1st step is sitting down with radiology/interventional radiology (referred by neurosurgery) -2 patient leans towards radiation treatment and wants to hold on neurosurgery visit for now. Patient felt like 20 minute emergency room visit with Dr. Christella Noa was very helpful.  -3 would be good to get ENT input- will see if we can move visit up from October 10th - will refer to vestibular rehab in case this is BPPV.  -we discussed prior remote cerebellar infarcts- I do not think those  would acutely cause balance issues but we should certainly modify risk factors to reduce risk -I also messaged Dr. Redmond Baseman to see if there is anyway possible to have patient be evaluated sooner  #hypertension with element of whitecoat hypertension S: medication: Lisinopril 10 mg daily  -was mild poor control but reported history of whitecoat hypertension Home readings #s: was high after fall but recently has been much better.  BP Readings from Last 3 Encounters:  04/11/21 134/88  04/08/21 (!) 156/103  04/04/21 (!) 155/105  A/P: blood pressure improved today- I think stress from   #hyperlipidemia/CAD/ Aortic Atherosclerosis S: Medication:Atorvastatin 80 mg and fish oil 1200 mg daily. Asprin 81 mg daily  - has a Hx of CAD but was not on Aspirin 81 mg daily. His last visit in person with Dr. Marlou Porch was in 2019. Aortic atherosclerosis was noted on CT. He does want him to start back on in.  -lipids panel on 01/2021-looked excellent Lab Results  Component Value Date   CHOL 107 01/17/2021   HDL 41.90 01/17/2021   LDLCALC 52 01/17/2021   TRIG 64.0 01/17/2021   CHOLHDL 3 01/17/2021   A/P: CAD asymptomatic.  Aortic atherosclerosis likely stable- same treatment for these will be maintained to reduce stroke risk recurrence  Recommended follow up: keep November visit Future Appointments  Date Time Provider [place]  04/14/2021  1:00 PM Lyndee Hensen, PT [facility]  04/19/2021  9:00 AM Mickeal Skinner, Acey Lav, MD [facility] None  04/19/2021 10:30 AM [facility] NURSE [facility] None  04/19/2021 11:00 AM Kyung Rudd, MD [facility] None  04/20/2021 12:15 PM Lyndee Hensen, PT [facility]  04/25/2021 12:15 PM Lyndee Hensen, PT [facility]  04/28/2021 12:15 PM Lyndee Hensen, PT [facility]  05/03/2021 12:15 PM Lyndee Hensen, PT [facility]  07/11/2021  9:20 AM Yong Channel [name], MD [facility]    Lab/Order associations:   ICD-10-CM   1. 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 Vertigo  R42 Ambulatory referral to Physical Therapy    CANCELED: Ambulatory referral to Physical Therapy    3. Coronary artery disease involving native heart without angina pectoris, unspecified vessel or lesion type  I25.10     4. Aortic atherosclerosis (HCC)  I70.0     5. Essential hypertension  I10     6. Hyperlipidemia, unspecified hyperlipidemia type  E78.5      I,Jada Bradford,acting as a scribe for Garret Reddish, MD.,have documented all relevant documentation on the behalf of Garret Reddish, MD,as directed by  Garret Reddish, MD while in the presence of Garret Reddish, MD.  I, Garret Reddish, MD, have reviewed all documentation for this visit. The documentation on 04/11/21 for the exam, diagnosis, procedures, and orders are all accurate and complete.  I, Garret Reddish, MD, have reviewed all documentation for this visit. The documentation on 04/11/21 for the exam, diagnosis, procedures, and orders are all accurate and complete.  Time Spent: 50 minutes of total time (2: 30 PM - 3:10 PM, 7:46-7:56 PM) was  spent on the date of the encounter performing the following actions: chart review prior to seeing the patient, obtaining history, performing a medically necessary exam, counseling on the treatment plan, placing orders, and documenting in our EHR.    Return precautions advised.  Garret Reddish, MD

## 2021-04-11 ENCOUNTER — Other Ambulatory Visit: Payer: Self-pay | Admitting: Cardiology

## 2021-04-11 ENCOUNTER — Ambulatory Visit (INDEPENDENT_AMBULATORY_CARE_PROVIDER_SITE_OTHER): Payer: Medicare Other | Admitting: Family Medicine

## 2021-04-11 ENCOUNTER — Encounter: Payer: Self-pay | Admitting: Family Medicine

## 2021-04-11 ENCOUNTER — Other Ambulatory Visit: Payer: Self-pay

## 2021-04-11 ENCOUNTER — Encounter: Payer: Medicare Other | Admitting: Physical Therapy

## 2021-04-11 VITALS — BP 134/88 | HR 76 | Temp 98.4°F | Ht 67.0 in | Wt 190.2 lb

## 2021-04-11 DIAGNOSIS — D333 Benign neoplasm of cranial nerves: Secondary | ICD-10-CM | POA: Diagnosis not present

## 2021-04-11 DIAGNOSIS — R42 Dizziness and giddiness: Secondary | ICD-10-CM | POA: Diagnosis not present

## 2021-04-11 DIAGNOSIS — I2510 Atherosclerotic heart disease of native coronary artery without angina pectoris: Secondary | ICD-10-CM

## 2021-04-11 DIAGNOSIS — I700 Atherosclerosis of aorta: Secondary | ICD-10-CM | POA: Diagnosis not present

## 2021-04-11 DIAGNOSIS — I10 Essential (primary) hypertension: Secondary | ICD-10-CM | POA: Diagnosis not present

## 2021-04-11 DIAGNOSIS — E785 Hyperlipidemia, unspecified: Secondary | ICD-10-CM

## 2021-04-11 NOTE — Addendum Note (Signed)
Addended by: Clyde Lundborg A on: 04/11/2021 09:14 AM   Modules accepted: Orders

## 2021-04-11 NOTE — Patient Instructions (Addendum)
Health Maintenance Due  Topic Date Due   Zoster Vaccines- Shingrix (2 of 2)   -Please check with your pharmacy to see if they have the shingrix vaccine. If they do- please get this immunization and update [place] by phone call or mychart with dates you receive the vaccine.   06/01/2020   INFLUENZA VACCINE   -Patient states that he will get this done closer to the fall.  03/14/2021     We will call you within two weeks about your referral to Vestibular Rehab . If you do not hear within 2 weeks, give [place] a call.   I will let you know if I get any new info about seeing a different doc at [facility] interventional radiology visit   After that visit let me know if you need a referral to neurosurgery   Recommended follow up: keep November visit or sooner if needed

## 2021-04-11 NOTE — Telephone Encounter (Signed)
Referral has been placed to Dr.Teoh.

## 2021-04-12 ENCOUNTER — Encounter: Payer: Medicare Other | Admitting: Physical Therapy

## 2021-04-12 ENCOUNTER — Telehealth: Payer: Self-pay

## 2021-04-12 ENCOUNTER — Telehealth: Payer: Self-pay | Admitting: Radiation Therapy

## 2021-04-12 NOTE — Telephone Encounter (Signed)
Yes, please cancel since he is being seen elsewhere.

## 2021-04-12 NOTE — Telephone Encounter (Signed)
Spoke with Mr. Mcquarter this morning. Introduced myself and shared that Dr. Christella Noa reached out requesting Mr. 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 He has a consult with Dr. Mickeal Skinner and Dr. Lisbeth Renshaw 9/6. I will await the recommendations from their visit before getting anything else set up. Mr. Alvelo was happy for the call and plans to attend these appointments in person on 9/6.  Mont Dutton R.T.(R)(T) Radiation Special Procedures Navigator

## 2021-04-12 NOTE — Telephone Encounter (Signed)
Pt called wanting to confirm with Dr Yong Channel that he needs to cancel all of his physical therapy appts with [facility]. Pt stated that he is being referred somewhere else for physical therapy. Please Advise.

## 2021-04-14 ENCOUNTER — Encounter: Payer: Medicare Other | Admitting: Physical Therapy

## 2021-04-15 NOTE — Progress Notes (Signed)
Location/Histology of Brain Tumor: Right Sided Vestibular Schwannoma/acoustic neuroma  Patient presented with symptoms of dizziness and recent fall.  MRI Brain 04/08/2021: 7 mm x 5 mm vestibular schwannoma in the right IAC  MRI Brain 04/04/2021:  No acute intracranial abnormality.  1 cm soft tissue lesion involving the right IAC, suspicious for a small mass, possibly schwannoma.  Past or anticipated interventions, if any, per neurosurgery:  Dr. Christella Noa  Past or anticipated interventions, if any, per medical oncology:  Dr. Mickeal Skinner 04/19/2021   Dose of Decadron, if applicable:   Recent neurologic symptoms, if any:  Seizures: No Headaches: Dull Nausea: No Dizziness/ataxia: Notes dizziness is worse in the morning.  Takes his time in the mornings, has a routine to get up.  Uses a walker first thing in the morning. Difficulty with hand coordination: No Focal numbness/weakness: No Visual deficits/changes: No Confusion/Memory deficits: No   SAFETY ISSUES: Prior radiation? No Pacemaker/ICD? No Possible current pregnancy? N/a Is the patient on methotrexate? No  Additional Complaints / other details:

## 2021-04-19 ENCOUNTER — Inpatient Hospital Stay: Payer: Medicare Other | Attending: Internal Medicine | Admitting: Internal Medicine

## 2021-04-19 ENCOUNTER — Other Ambulatory Visit: Payer: Self-pay

## 2021-04-19 ENCOUNTER — Ambulatory Visit
Admission: RE | Admit: 2021-04-19 | Discharge: 2021-04-19 | Disposition: A | Discharge status: Home / Self Care | Payer: Medicare Other | Source: Ambulatory Visit | Attending: Radiation Oncology | Admitting: Radiation Oncology

## 2021-04-19 VITALS — BP 141/79 | HR 89 | Temp 98.2°F | Resp 17 | Ht 67.0 in | Wt 192.1 lb

## 2021-04-19 DIAGNOSIS — E7800 Pure hypercholesterolemia, unspecified: Secondary | ICD-10-CM | POA: Insufficient documentation

## 2021-04-19 DIAGNOSIS — G4730 Sleep apnea, unspecified: Secondary | ICD-10-CM | POA: Insufficient documentation

## 2021-04-19 DIAGNOSIS — K219 Gastro-esophageal reflux disease without esophagitis: Secondary | ICD-10-CM | POA: Insufficient documentation

## 2021-04-19 DIAGNOSIS — Z85828 Personal history of other malignant neoplasm of skin: Secondary | ICD-10-CM | POA: Insufficient documentation

## 2021-04-19 DIAGNOSIS — Z808 Family history of malignant neoplasm of other organs or systems: Secondary | ICD-10-CM | POA: Diagnosis not present

## 2021-04-19 DIAGNOSIS — N400 Enlarged prostate without lower urinary tract symptoms: Secondary | ICD-10-CM | POA: Diagnosis not present

## 2021-04-19 DIAGNOSIS — I2510 Atherosclerotic heart disease of native coronary artery without angina pectoris: Secondary | ICD-10-CM | POA: Insufficient documentation

## 2021-04-19 DIAGNOSIS — K449 Diaphragmatic hernia without obstruction or gangrene: Secondary | ICD-10-CM | POA: Diagnosis not present

## 2021-04-19 DIAGNOSIS — D333 Benign neoplasm of cranial nerves: Secondary | ICD-10-CM | POA: Diagnosis not present

## 2021-04-19 DIAGNOSIS — Z79899 Other long term (current) drug therapy: Secondary | ICD-10-CM | POA: Diagnosis not present

## 2021-04-19 DIAGNOSIS — K760 Fatty (change of) liver, not elsewhere classified: Secondary | ICD-10-CM | POA: Insufficient documentation

## 2021-04-19 NOTE — Progress Notes (Signed)
Radiation Oncology         [phone] ________________________________  Name: Wesley [name]        MRN: [id number]  Date of Service: 04/19/2021 DOB: 06/21/1941  CZ:[id number], Wesley Mars, MD  Ashok Pall, MD     REFERRING PHYSICIAN: Ashok Pall, MD   DIAGNOSIS: The encounter diagnosis was Vestibular schwannoma [facility]).   HISTORY OF PRESENT ILLNESS: Wesley [name] is a 80 y.o. male seen at the request of Dr. 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  The patient presented after symptoms of dizziness and a recent fall, this led to a work-up including an MRI scan of the brain on 04/04/2021 no acute intracranial abnormalities were identified but a 1 cm soft tissue lesion involving the right internal auditory canal was seen. He returned to the emergency room on 04/08/2021 with additional episodes of dizziness and another of the MRI of the brain showed a 7 x 5 mm enhancing lesion in the right internal auditory canal no evidence of extension into the cochlea.  He was seen by Dr. Christella [name] and recommendations have been made for proceeding with stereotactic radiosurgery.  He was given a prescription for Antivert in the ER and is seen today to discuss radiosurgery. He met with Dr. Mickeal [name] and it is felt that his vertigo is not the result of his schwannoma, and he may decide to be followed in observation. He is seen today though to discuss options of stereotactic radiosurgery [name]).     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 PT denies significant findings   CARDIAC CATHETERIZATION  2008   COLOSTOMY N/A 10/31/2019   Procedure: COLOSTOMY;  Surgeon: CErroll Luna MD;  Location: [facility];  Service: General;  Laterality: N/A;   CYSTOSCOPY W/ URETERAL STENT PLACEMENT Bilateral 10/31/2019   Procedure: CYSTOSCOPY URETERAL STENT PLACEMENT BILATERAL;  Surgeon: DFranchot Gallo MD;  Location: [facility];  Service: Urology;  Laterality: Bilateral;   CYSTOSCOPY WITH STENT PLACEMENT Bilateral 03/11/2020   Procedure: CYSTOSCOPY WITH BILATERAL FIREFLY INJECTION;  Surgeon: WIrine Seal MD;  Location: [facility];  Service: Urology;  Laterality: Bilateral;   EYE SURGERY     BILATERAL CATARACT SURGERY WITH LENS IMPLANTS   FLEXIBLE SIGMOIDOSCOPY N/A 10/29/2019   Procedure: FLEXIBLE SIGMOIDOSCOPY;  Surgeon: PJerene Bears MD;  [facility];  Service: Gastroenterology;  Laterality: N/A;   INCISIONAL HERNIA REPAIR N/A 10/13/2020   Procedure:  OPEN INCISIONAL HERNIA REPAIR WITH MESH;  Surgeon: [name], MD;  Location: [place];  Service: General;  Laterality: N/A;   INSERTION OF MESH N/A 09/08/2016   Procedure: INSERTION OF MESH;  Surgeon: Jackolyn Confer, MD;  Location[facility];  Service: General;  Laterality: N/A;   IR RADIOLOGIST EVAL & MGMT  11/10/2020   IR RADIOLOGIST EVAL & MGMT  11/25/2020   LAPAROSCOPIC SIGMOID COLECTOMY N/A 10/31/2019   Procedure: DIAGNOSTIC LAPAROSCOPY; EXPLORATORY LAPAROTOMY; SIGMOID COLECTOMY;  Surgeon: Erroll Luna, MD;  Location:  [facility];  Service: General;  Laterality: N/A;   right rotator cuff     SUBMUCOSAL TATTOO INJECTION  10/29/2019   Procedure: SUBMUCOSAL TATTOO INJECTION;  Surgeon: Jerene Bears, MD;  [facility];  Service: Gastroenterology;;   TONSILLECTOMY     UMBILICAL HERNIA REPAIR N/A 09/08/2016   Procedure: OPEN UMBILICAL HERNIA REPAIR WITH MESH;  Surgeon: Jackolyn Confer, MD;  [facility];  Service: General;  Laterality: N/A;   VENTRAL HERNIA REPAIR N/A 02/03/2021   Procedure: LAPAROSCOPIC VENTRAL HERNIA REPAIR WITH MESH;  Surgeon: [name], MD;  Location: [place];  Service: General;  Laterality: N/A;   XI ROBOTIC ASSISTED COLOSTOMY TAKEDOWN N/A 03/11/2020   Procedure: ROBOTIC ASSISTED COLOSTOMY REVERSAL, RIGID PROCTOSCOPY;  Surgeon: Leighton Ruff, MD;  Location[facility];  Service: General;  Laterality: N/A;     FAMILY HISTORY:  Family History  Problem Relation Age of Onset   Diabetes Mother    Heart failure Mother        around 52   CAD Father        83   Melanoma Brother 30   Healthy Daughter    Healthy Son    Colon cancer Neg Hx    Esophageal cancer Neg Hx    Rectal cancer Neg Hx    Stomach cancer Neg Hx      SOCIAL HISTORY:  reports that he has never smoked. He has never used smokeless tobacco. He reports current alcohol use of about 14.0 standard drinks per week. He reports that he does not use drugs. The patient is a retired Chief Financial Officer. He lives in [place] and is accompanied by his wife Wesley [name].    ALLERGIES: Demerol, Promethazine hcl, and Ativan [lorazepam]   MEDICATIONS:  Current Outpatient Medications  Medication Sig Dispense Refill   aspirin EC 81 MG tablet Take 81 mg by mouth daily. Swallow whole.     atorvastatin (LIPITOR) 80 MG tablet TAKE 1 TABLET EVERY DAY 90 tablet 2   Cholecalciferol (VITAMIN D3) 25 MCG (1000 UT) CAPS Take 1,000 Units by mouth daily.     finasteride (PROSCAR) 5 MG tablet Take 5 mg by mouth daily.     lisinopril (ZESTRIL) 10 MG  tablet Take 10 mg by mouth daily.     loratadine (CLARITIN) 10 MG tablet Take 10 mg by mouth daily.     magnesium oxide (MAG-OX) 400 MG tablet Take 400 mg by mouth daily.     meclizine (ANTIVERT) 25 MG tablet Take 1 tablet (25 mg total) by mouth 3 (three) times daily as needed for up to 15 doses for dizziness. 15 tablet 0   Omega-3 Fatty Acids (FISH OIL) 1200 MG CAPS Take 1,200 mg by mouth daily.     LORazepam (ATIVAN) 0.5 MG tablet Take 1 tablet (0.5 mg total) by mouth every 8 (eight) hours as needed for anxiety. (Patient not taking: No sig reported) 15 tablet 0   No current [name]-administered medications for this  encounter.     REVIEW OF SYSTEMS: On review of systems, the patient reports that he is doing fairly well today. He's glad to hear that the dizziness was probably unrelated to his MRI findings. He has used a cane for about 2 years following loss of strength and muscle mass as a result of multiple abdominal surgeries.     PHYSICAL EXAM:  Wt Readings from Last 3 Encounters:  04/19/21 192 lb 1.6 oz (87.1 kg)  04/11/21 190 lb 3.2 oz (86.3 kg)  04/08/21 185 lb (83.9 kg)   Temp Readings from Last 3 Encounters:  04/19/21 98.2 F (36.8 C) (Oral)  04/11/21 98.4 F (36.9 C) (Temporal)  04/08/21 98.5 F (36.9 C)   BP Readings from Last 3 Encounters:  04/19/21 (!) 141/79  04/11/21 134/88  04/08/21 (!) 156/103   Pulse Readings from Last 3 Encounters:  04/19/21 89  04/11/21 76  04/08/21 88    /10  In general this is a well appearing Caucasian male in no acute distress.  He's alert and oriented x4 and appropriate throughout the examination. Cardiopulmonary assessment is negative for acute distress and he exhibits normal effort.     ECOG = 1  0 - Asymptomatic (Fully active, able to carry on all predisease activities without restriction)  1 - Symptomatic but completely ambulatory (Restricted in physically strenuous activity but ambulatory and able to carry out work of a  light or sedentary nature. For example, light housework, office work)  2 - Symptomatic, <50% in bed during the day (Ambulatory and capable of all self care but unable to carry out any work activities. Up and about more than 50% of waking hours)  3 - Symptomatic, >50% in bed, but not bedbound (Capable of only limited self-care, confined to bed or chair 50% or more of waking hours)  4 - Bedbound (Completely disabled. Cannot carry on any self-care. Totally confined to bed or chair)  5 - Death   Eustace Pen MM, Creech RH, Tormey DC, et al. [phone]). "Toxicity and response criteria of the [facility]". [place] Oncol. 5 (6): 649-55    LABORATORY DATA:  Lab Results  Component Value Date   WBC 5.1 04/08/2021   HGB 14.3 04/08/2021   HCT 43.6 04/08/2021   MCV 103.8 (H) 04/08/2021   PLT 168 04/08/2021   Lab Results  Component Value Date   NA 133 (L) 04/08/2021   K 4.3 04/08/2021   CL 103 04/08/2021   CO2 25 04/08/2021   Lab Results  Component Value Date   ALT 34 04/08/2021   AST 35 04/08/2021   ALKPHOS 70 04/08/2021   BILITOT 0.9 04/08/2021      RADIOGRAPHY: CT HEAD WO CONTRAST (5MM)  Result Date: 04/04/2021 CLINICAL DATA:  HEAD TRAUMA, MINOR EXAM: CT HEAD WITHOUT CONTRAST TECHNIQUE: Contiguous axial images were obtained from the base of the skull through the vertex without intravenous contrast. COMPARISON:  08/12/2013. FINDINGS: Brain: No evidence of acute infarction, hemorrhage, hydrocephalus, extra-axial collection or mass lesion/mass effect. Periventricular white matter changes, likely the sequela of chronic small vessel ischemic disease. Age-appropriate cerebral atrophy. Vascular: No hyperdense vessel or unexpected calcification. Skull: Normal. Negative for fracture or focal lesion. Sinuses/Orbits: No acute finding. Other: Increased density on the right lateral frontal scalp, likely abrasion. IMPRESSION: No acute intracranial process. Electronically Signed   By:  Merilyn Baba M.D.   On: 04/04/2021 16:36   MR BRAIN WO CONTRAST  Result Date: 04/04/2021 CLINICAL DATA:  Initial  evaluation for acute dizziness. EXAM: MRI HEAD WITHOUT CONTRAST TECHNIQUE: Multiplanar, multiecho pulse sequences of the brain and surrounding structures were obtained without intravenous contrast. COMPARISON:  Prior CT from earlier the same day. FINDINGS: Brain: Generalized age-related cerebral atrophy with mild chronic small vessel ischemic disease. Few small remote left cerebellar infarcts noted. No abnormal foci of restricted diffusion to suggest acute or subacute ischemia. Gray-white matter differentiation maintained. No encephalomalacia to suggest chronic cortical infarction. No evidence for acute or chronic intracranial hemorrhage. There is an approximate 1 cm soft tissue lesion involving the right IAC (series 11, image 11), suspicious for a small mass, possibly schwannoma. No other discrete mass lesion. No mass effect or midline shift. No hydrocephalus or extra-axial fluid collection. Pituitary gland suprasellar region within normal limits. Midline structures intact. Vascular: Right vertebral artery hypoplastic and not well seen. Major intracranial vascular flow voids are otherwise maintained. Skull and upper cervical spine: Craniocervical junction within normal limits. Bone marrow signal intensity normal. No scalp soft tissue abnormality. Sinuses/Orbits: Patient status post bilateral ocular lens replacement. Globes and orbital soft tissues demonstrate no acute finding. Paranasal sinuses are clear. No mastoid effusion. Other: None. IMPRESSION: 1. No acute intracranial abnormality. 2. 1 cm soft tissue lesion involving the right IAC, suspicious for a small mass, possibly schwannoma. Further evaluation with dedicated IAC protocol MRI, with and without contrast, suggested for further evaluation. 3. Age-related cerebral atrophy with mild chronic small vessel ischemic disease, with a few small  remote left cerebellar infarcts. Electronically Signed   By: Jeannine Boga M.D.   On: 04/04/2021 21:07   MR BRAIN W WO CONTRAST  Result Date: 04/08/2021 CLINICAL DATA:  Dizziness EXAM: MRI HEAD WITHOUT AND WITH CONTRAST TECHNIQUE: Multiplanar, multiecho pulse sequences of the brain and surrounding structures were obtained without and with intravenous contrast. CONTRAST:  96m GADAVIST GADOBUTROL 1 MMOL/ML IV SOLN COMPARISON:  Brain MRI 04/04/2021 FINDINGS: Brain: Scattered foci of FLAIR signal hyperintensity are again seen in the subcortical and periventricular white matter likely reflecting sequela of chronic white matter microangiopathy. Global parenchymal volume loss with commensurate enlargement of the ventricular system is unchanged. 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 There is no evidence of acute intracranial hemorrhage or infarct. There is a 7 mm by 5 mm enhancing lesion in the right IAC. There is no evidence of extension into the cochlea. The left IAC is unremarkable. There is no other abnormal enhancement. Vascular: Normal flow voids. Skull and upper cervical spine: Normal marrow signal. Sinuses/Orbits: Bilateral lens implants are noted. The globes and orbits are otherwise unremarkable. The paranasal sinuses and mastoid air cells are clear. Other: None. 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 Electronically Signed   By: PValetta MoleM.D.   On: 04/08/2021 15:38       IMPRESSION/PLAN: 1. 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 MLisbeth Renshawdiscusses the imaging findings and the rationale for consideration of stereotactic radiosurgery to treat this site.  The patient is aware that this would help prevent further growth however complete resolution is not anticipated.  As such we appreciate Dr. VRenda Rollsinput for his symptoms.  If he were to proceed with SRS, we would need a 3T MRI for planning purposes. We discussed the risks,  benefits, short, and long term effects of radiotherapy, as well as the definitive dosing intent. Dr. MLisbeth Renshawdiscusses the delivery and logistics of radiotherapy and anticipates a course of at least 1 possibly more than 1 fraction  of treatment if he were to proceed. We will follow along with his decision making but anticipate he will continue to be followed in the brain oncology conference in observation and see Dr. Mickeal [name].  In a visit lasting 60 minutes, greater than 50% of the time was spent face to face discussing the patient's condition, in preparation for the discussion, and coordinating the patient's care.   The above documentation reflects my direct findings during this shared patient visit. Please see the separate note by Dr. Lisbeth Renshaw on this date for the remainder of the patient's plan of care.    Carola Rhine, [facility]   **Disclaimer: This note was dictated with voice recognition software. Similar sounding words can inadvertently be transcribed and this note may contain transcription errors which may not have been corrected upon publication of note.**

## 2021-04-19 NOTE — Progress Notes (Signed)
[facility] [place], [place] [address] [phone]   New Patient Evaluation  Date of Service: 04/19/21 Patient Name: NAYAN TRENARY Patient MRN: [id number] Patient DOB: [birth year] Provider: Ventura Sellers, MD  Identifying Statement:  JAYSON MIGLIOZZI is a 80 y.o. male with  right   vestibular schwannoma  who presents for initial consultation and evaluation.    Referring Provider: Ashok Pall, MD [address],  [place] [address]  Oncologic History 04/04/21: MRI demonstrates small R vestibular schwannoma after provoked fall  History of Present Illness: The patient's records from the referring physician were obtained and reviewed and the patient interviewed to confirm this HPI.  MARTINEZ WATERFIELD presented to medical attention on 8/22 after fall and head trauma, provoked by tripping over his dogs.  He hit the side of his head on some furniture; next day developed vertigo symptoms, CNS imaging subsequently demonstrated a right sided vestibular schwannoma.  Since then, headaches have resolved, but he still experiences "sometimes intense" vertigo upon sitting up or laying down, duration is "seconds to a minute" and it does not persist between episodes.  Otherwise denies double vision, facial weakness or numbness.  Functionally intact and independent at home.  Medications: Current Outpatient Medications on File Prior to Visit  Medication Sig Dispense Refill   aspirin EC 81 MG tablet Take 81 mg by mouth daily. Swallow whole.     atorvastatin (LIPITOR) 80 MG tablet TAKE 1 TABLET EVERY DAY 90 tablet 2   Cholecalciferol (VITAMIN D3) 25 MCG (1000 UT) CAPS Take 1,000 Units by mouth daily.     finasteride (PROSCAR) 5 MG tablet Take 5 mg by mouth daily.     lisinopril (ZESTRIL) 10 MG tablet Take 10 mg by mouth daily.     loratadine (CLARITIN) 10 MG tablet Take 10 mg by mouth daily.     magnesium oxide (MAG-OX) 400  MG tablet Take 400 mg by mouth daily.     meclizine (ANTIVERT) 25 MG tablet Take 1 tablet (25 mg total) by mouth 3 (three) times daily as needed for up to 15 doses for dizziness. 15 tablet 0   Omega-3 Fatty Acids (FISH OIL) 1200 MG CAPS Take 1,200 mg by mouth daily.     LORazepam (ATIVAN) 0.5 MG tablet Take 1 tablet (0.5 mg total) by mouth every 8 (eight) hours as needed for anxiety. (Patient not taking: No sig reported) 15 tablet 0   No current facility-administered medications on file prior to visit.    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)    CAD (coronary artery disease)    Cholelithiasis    Chronic allergic rhinitis    Coronary atherosclerosis of native coronary artery    Fatty liver    GERD (gastroesophageal reflux disease)    HX 11yr ago, no longer a problem, no meds   High blood pressure    Hypercholesterolemia    Macrocytosis without anemia    OSA on CPAP    Paraesophageal hernia    Partial bowel obstruction (HCC)    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 PT denies significant findings   CARDIAC CATHETERIZATION  2008   COLOSTOMY N/A 10/31/2019   Procedure: COLOSTOMY;  Surgeon: Erroll Luna, MD;  Location: [facility];  Service: General;  Laterality: N/A;   CYSTOSCOPY W/ URETERAL STENT PLACEMENT Bilateral 10/31/2019   Procedure:  CYSTOSCOPY URETERAL STENT PLACEMENT BILATERAL;  Surgeon: Franchot Gallo, MD;  Location: [facility];  Service: Urology;  Laterality: Bilateral;   CYSTOSCOPY WITH STENT PLACEMENT Bilateral 03/11/2020   Procedure: CYSTOSCOPY WITH BILATERAL FIREFLY INJECTION;  Surgeon: Irine Seal, MD;  Location: [facility];  Service: Urology;  Laterality: Bilateral;   EYE SURGERY     BILATERAL CATARACT SURGERY WITH LENS IMPLANTS   FLEXIBLE SIGMOIDOSCOPY N/A 10/29/2019   Procedure: FLEXIBLE SIGMOIDOSCOPY;  Surgeon: Jerene Bears, MD;  [facility]: [facility];  Service: Gastroenterology;  Laterality: N/A;   INCISIONAL HERNIA REPAIR N/A 10/13/2020   Procedure: OPEN INCISIONAL HERNIA REPAIR WITH MESH;  Surgeon: [name], MD;  Location: [facility];  Service: General;  Laterality: N/A;   INSERTION OF MESH N/A 09/08/2016   Procedure: INSERTION OF MESH;  Surgeon: Jackolyn Confer, MD;  Location: [facility];  Service: General;  Laterality: N/A;   IR RADIOLOGIST EVAL & MGMT  11/10/2020   IR RADIOLOGIST EVAL & MGMT  11/25/2020   LAPAROSCOPIC SIGMOID COLECTOMY N/A 10/31/2019   Procedure: DIAGNOSTIC LAPAROSCOPY; EXPLORATORY LAPAROTOMY; SIGMOID COLECTOMY;  Surgeon: Erroll Luna, MD;  Location: [facility];  Service: General;  Laterality: N/A;   right rotator cuff     SUBMUCOSAL TATTOO INJECTION  10/29/2019   Procedure: SUBMUCOSAL TATTOO INJECTION;  Surgeon: Jerene Bears, MD;  [facility];  Service: Gastroenterology;;   TONSILLECTOMY     UMBILICAL HERNIA REPAIR N/A 09/08/2016   Procedure: OPEN UMBILICAL HERNIA REPAIR WITH MESH;  Surgeon: Jackolyn Confer, MD;  [facility];  Service: General;  Laterality: N/A;   VENTRAL HERNIA REPAIR N/A 02/03/2021   Procedure: LAPAROSCOPIC VENTRAL HERNIA REPAIR WITH MESH;  Surgeon: [name], MD;  Location: [facility];  Service: General;  Laterality: N/A;   XI ROBOTIC ASSISTED COLOSTOMY TAKEDOWN N/A 03/11/2020   Procedure: ROBOTIC ASSISTED COLOSTOMY REVERSAL, RIGID PROCTOSCOPY;  Surgeon: Leighton Ruff, MD;  Location[facility];  Service: General;  Laterality: N/A;   Social History:  Social History   Socioeconomic History   Marital status: Married    Spouse name: Wilde Spofford    Number of children: 2   Years of education: 12   Highest education level: Master's degree (e.g., MA, MS, MEng, MEd, MSW, MBA)  Occupational History   Occupation: Retired   Tobacco Use   Smoking status: Never   Smokeless tobacco: Never  Vaping Use   Vaping Use: Never used  Substance and Sexual Activity   Alcohol use: Yes    Alcohol/week: 14.0 standard drinks    Types: 14 Standard drinks or equivalent per week   Drug use: No   Sexual activity: Not Currently  Other Topics Concern   Not on file  Social History Narrative   Married. 2 children 52 in 45 in 2022- both went to [facility]. 5 grandkids- all girls from oldest 72 looking premed [employer],  79 soph [employer], 64 34 year olds, 15 year olds.       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 [employer]: fishing, [employer] football and basketball, Oceanographer braves, yardwork   Social Determinants of [place] Strain: Not on [place]: Not on file  Transportation Needs: Not on file  Physical Activity: Not on file  Stress: Not on file  Social Connections: Not on file  Intimate Partner Violence: Not on file   Family History:  Family History  Problem Relation Age of Onset   Diabetes Mother    Heart failure Mother        around 64   CAD Father        30   Melanoma Brother 42   Healthy Daughter    Healthy Son    Colon cancer Neg Hx    Esophageal cancer Neg Hx    Rectal cancer Neg Hx    Stomach cancer Neg Hx     Review of Systems: Constitutional: Doesn't report fevers, chills or abnormal weight loss Eyes: Doesn't report blurriness of vision Ears, nose, mouth, throat, and face: Doesn't report sore  throat Respiratory: Doesn't report cough, dyspnea or wheezes Cardiovascular: Doesn't report palpitation, chest discomfort  Gastrointestinal:  Doesn't report nausea, constipation, diarrhea GU: Doesn't report incontinence Skin: Doesn't report skin rashes Neurological: Per HPI Musculoskeletal: Doesn't report joint pain Behavioral/Psych: Doesn't report anxiety  Physical Exam: Vitals:   04/19/21 0928  BP: (!) 141/79  Pulse: 89  Resp: 17  Temp: 98.2 F (36.8 C)  SpO2: 99%   KPS: 90. General: Alert, cooperative, pleasant, in no acute distress Head: Normal EENT: No conjunctival injection or scleral icterus.  Lungs: Resp effort normal Cardiac: Regular rate Abdomen: Non-distended abdomen Skin: No rashes cyanosis or petechiae. Extremities: No clubbing or edema  Neurologic Exam: Mental Status: Awake, alert, attentive to examiner. Oriented to self and environment. Language is fluent with intact comprehension.  Cranial Nerves: Visual acuity is grossly normal. Visual fields are full. Extra-ocular movements intact. No ptosis. Face is symmetric Motor: Tone and bulk are normal. Power is full in both arms and legs. Reflexes are symmetric, no pathologic reflexes present.  Sensory: Intact to light touch Gait: Normal.   Labs: I have reviewed the data as listed    Component Value Date/Time   NA 133 (L) 04/08/2021 1346   K 4.3 04/08/2021 1346   CL 103 04/08/2021 1346   CO2 25 04/08/2021 1346   GLUCOSE 92 04/08/2021 1346   BUN 18 04/08/2021 1346   CREATININE 0.90 04/08/2021 1346   CALCIUM 8.8 (L) 04/08/2021 1346   PROT 10.1 (H) 04/08/2021 1346   ALBUMIN 3.0 (L) 04/08/2021 1346   AST 35 04/08/2021 1346   ALT 34 04/08/2021 1346   ALKPHOS 70 04/08/2021 1346   BILITOT 0.9 04/08/2021 1346   GFRNONAA >60 04/08/2021 1346   GFRAA >60 03/14/2020 0553   Lab Results  Component Value Date   WBC 5.1 04/08/2021   NEUTROABS 2.1 04/08/2021   HGB 14.3 04/08/2021   HCT 43.6 04/08/2021   MCV  103.8 (H) 04/08/2021   PLT 168 04/08/2021    Imaging:  CT HEAD WO CONTRAST (5MM)  Result Date: 04/04/2021 CLINICAL DATA:  HEAD TRAUMA, MINOR EXAM: CT HEAD WITHOUT CONTRAST TECHNIQUE: Contiguous axial images were obtained from the base of  the skull through the vertex without intravenous contrast. COMPARISON:  08/12/2013. FINDINGS: Brain: No evidence of acute infarction, hemorrhage, hydrocephalus, extra-axial collection or mass lesion/mass effect. Periventricular white matter changes, likely the sequela of chronic small vessel ischemic disease. Age-appropriate cerebral atrophy. Vascular: No hyperdense vessel or unexpected calcification. Skull: Normal. Negative for fracture or focal lesion. Sinuses/Orbits: No acute finding. Other: Increased density on the right lateral frontal scalp, likely abrasion. IMPRESSION: No acute intracranial process. Electronically Signed   By: Merilyn Baba M.D.   On: 04/04/2021 16:36   MR BRAIN WO CONTRAST  Result Date: 04/04/2021 CLINICAL DATA:  Initial evaluation for acute dizziness. EXAM: MRI HEAD WITHOUT CONTRAST TECHNIQUE: Multiplanar, multiecho pulse sequences of the brain and surrounding structures were obtained without intravenous contrast. COMPARISON:  Prior CT from earlier the same day. FINDINGS: Brain: Generalized age-related cerebral atrophy with mild chronic small vessel ischemic disease. Few small remote left cerebellar infarcts noted. No abnormal foci of restricted diffusion to suggest acute or subacute ischemia. Gray-white matter differentiation maintained. No encephalomalacia to suggest chronic cortical infarction. No evidence for acute or chronic intracranial hemorrhage. There is an approximate 1 cm soft tissue lesion involving the right IAC (series 11, image 11), suspicious for a small mass, possibly schwannoma. No other discrete mass lesion. No mass effect or midline shift. No hydrocephalus or extra-axial fluid collection. Pituitary gland suprasellar region  within normal limits. Midline structures intact. Vascular: Right vertebral artery hypoplastic and not well seen. Major intracranial vascular flow voids are otherwise maintained. Skull and upper cervical spine: Craniocervical junction within normal limits. Bone marrow signal intensity normal. No scalp soft tissue abnormality. Sinuses/Orbits: Patient status post bilateral ocular lens replacement. Globes and orbital soft tissues demonstrate no acute finding. Paranasal sinuses are clear. No mastoid effusion. Other: None. IMPRESSION: 1. No acute intracranial abnormality. 2. 1 cm soft tissue lesion involving the right IAC, suspicious for a small mass, possibly schwannoma. Further evaluation with dedicated IAC protocol MRI, with and without contrast, suggested for further evaluation. 3. Age-related cerebral atrophy with mild chronic small vessel ischemic disease, with a few small remote left cerebellar infarcts. Electronically Signed   By: Jeannine Boga M.D.   On: 04/04/2021 21:07   MR BRAIN W WO CONTRAST  Result Date: 04/08/2021 CLINICAL DATA:  Dizziness EXAM: MRI HEAD WITHOUT AND WITH CONTRAST TECHNIQUE: Multiplanar, multiecho pulse sequences of the brain and surrounding structures were obtained without and with intravenous contrast. CONTRAST:  9m GADAVIST GADOBUTROL 1 MMOL/ML IV SOLN COMPARISON:  Brain MRI 04/04/2021 FINDINGS: Brain: Scattered foci of FLAIR signal hyperintensity are again seen in the subcortical and periventricular white matter likely reflecting sequela of chronic white matter microangiopathy. Global parenchymal volume loss with commensurate enlargement of the ventricular system is unchanged. 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 There is no evidence of acute intracranial hemorrhage or infarct. There is a 7 mm by 5 mm enhancing lesion in the right IAC. There is no evidence of extension into the cochlea. The left IAC is unremarkable. There is no other abnormal  enhancement. Vascular: Normal flow voids. Skull and upper cervical spine: Normal marrow signal. Sinuses/Orbits: Bilateral lens implants are noted. The globes and orbits are otherwise unremarkable. The paranasal sinuses and mastoid air cells are clear. Other: None. 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 Electronically Signed   By: PValetta MoleM.D.   On: 04/08/2021 15:38     Assessment/Plan Vestibular schwannoma ([facility]  We appreciate the opportunity to participate  in the care of AZLAN AUGUSTO.  He presents with clinical syndrome consistent with positional vertigo, localizing to the posterior semicircular canal.  Etiology is suspected otolith.    MRI demonstrates unrelated, or minimally related, right CNVIII schwannoma, less than 1cm.  He does not describe hearing impairment, though baseline audiology not yet performed.  We reviewed options for intervention, including surgical resection, radiosurgery, serial imaging observation.  Because his tumor is small, asymptomatic, hearing likely normal, it would be reasonable to defer radiation until later date.  We would then re-assess with MRI brain and repeat audiology in 6 months.  He will meet with Dr. Lisbeth Renshaw as well, to discuss risks and benefits of radiation.    He will let us know which pathway he prefers.   For vertigo, we counseled him on judicious use of meclizine.  He will go through with ENT evaluation as well as vestibular rehab.   Screening for potential clinical trials was performed and discussed using eligibility criteria for active protocols at [facility], loco-regional tertiary centers, as well as national database available on [email or web address].com.    The patient is not a candidate for a research protocol at this time due to no suitable study identified.   We spent twenty additional minutes teaching regarding the natural history, biology, and historical experience in the treatment of brain tumors. We then  discussed in detail the current recommendations for therapy focusing on the mode of administration, mechanism of action, anticipated toxicities, and quality of life issues associated with this plan. We also provided teaching sheets for the patient to take home as an additional resource.  All questions were answered. The patient knows to call the clinic with any problems, questions or concerns. No barriers to learning were detected.  The total time spent in the encounter was 45 minutes and more than 50% was on counseling and review of test results   Ventura Sellers, MD Medical Director of Neuro-Oncology [facility] [place] 04/19/21 10:56 AM

## 2021-04-20 ENCOUNTER — Encounter: Payer: Medicare Other | Admitting: Physical Therapy

## 2021-04-22 ENCOUNTER — Encounter: Payer: Self-pay | Admitting: Family Medicine

## 2021-04-25 ENCOUNTER — Encounter: Payer: Medicare Other | Admitting: Physical Therapy

## 2021-04-26 ENCOUNTER — Other Ambulatory Visit: Payer: Self-pay | Admitting: Internal Medicine

## 2021-04-26 DIAGNOSIS — D333 Benign neoplasm of cranial nerves: Secondary | ICD-10-CM

## 2021-04-27 ENCOUNTER — Telehealth: Payer: Self-pay | Admitting: Internal Medicine

## 2021-04-27 NOTE — Telephone Encounter (Signed)
Scheduled per sch msg. Called, not able to leave msg. Mailed printout  °

## 2021-04-28 ENCOUNTER — Encounter: Payer: Medicare Other | Admitting: Physical Therapy

## 2021-05-02 ENCOUNTER — Other Ambulatory Visit: Payer: Self-pay

## 2021-05-02 ENCOUNTER — Ambulatory Visit: Payer: Medicare Other | Attending: Family Medicine

## 2021-05-02 DIAGNOSIS — R2689 Other abnormalities of gait and mobility: Secondary | ICD-10-CM | POA: Insufficient documentation

## 2021-05-02 DIAGNOSIS — R42 Dizziness and giddiness: Secondary | ICD-10-CM | POA: Diagnosis not present

## 2021-05-02 DIAGNOSIS — M6281 Muscle weakness (generalized): Secondary | ICD-10-CM | POA: Insufficient documentation

## 2021-05-02 DIAGNOSIS — R2681 Unsteadiness on feet: Secondary | ICD-10-CM | POA: Diagnosis not present

## 2021-05-02 NOTE — Therapy (Signed)
[facility] [address] [place], [place], [address] Phone: [phone]   Fax:  [phone]  Physical Therapy Evaluation  Patient Details  Name: Wesley [name] MRN: [id number] Date of Birth: 01-22-1941 Referring Provider (PT): Marin Olp, MD   Encounter Date: 05/02/2021   PT End of Session - 05/02/21 1030     Visit Number 1    Number of Visits 13    Date for PT Re-Evaluation 06/17/21    Authorization Type Medicare A+B/[employer]ARP    Progress Note Due on Visit 10    PT Start Time 1018    PT Stop Time 1100    PT Time Calculation (min) 42 min    Activity Tolerance Patient tolerated treatment well    Behavior During Therapy [facility] for tasks assessed/performed             Past Medical History:  Diagnosis Date   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 symptoms (LUTS)    CAD (coronary artery disease)    Cholelithiasis    Chronic allergic rhinitis    Coronary atherosclerosis of native coronary artery    Fatty liver    GERD (gastroesophageal reflux disease)    HX 24yr ago, no longer a problem, no meds   High blood pressure    Hypercholesterolemia    Macrocytosis without anemia    OSA on CPAP    Paraesophageal hernia    Partial bowel obstruction ([place]    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 PT denies significant findings   CARDIAC CATHETERIZATION  2008   COLOSTOMY N/A 10/31/2019   Procedure: COLOSTOMY;  Surgeon: CErroll Luna MD;  Location: [facility];  Service: General;  Laterality: N/A;   CYSTOSCOPY W/ URETERAL STENT PLACEMENT Bilateral 10/31/2019   Procedure:  CYSTOSCOPY URETERAL STENT PLACEMENT BILATERAL;  Surgeon: DFranchot Gallo MD;  [facility]: [facility];  Service: Urology;  Laterality: Bilateral;   CYSTOSCOPY WITH STENT PLACEMENT Bilateral 03/11/2020   Procedure: CYSTOSCOPY WITH BILATERAL FIREFLY INJECTION;  Surgeon: WIrine Seal MD;  Location: [facility];  Service: Urology;  Laterality: Bilateral;   EYE SURGERY     BILATERAL CATARACT SURGERY WITH LENS IMPLANTS   FLEXIBLE SIGMOIDOSCOPY N/A 10/29/2019   Procedure: FLEXIBLE SIGMOIDOSCOPY;  Surgeon: PJerene Bears MD;  [facility];  Service: Gastroenterology;  Laterality: N/A;   INCISIONAL HERNIA REPAIR N/A 10/13/2020   Procedure: OPEN INCISIONAL HERNIA REPAIR WITH MESH;  Surgeon: RRalene Ok MD;  [facility] [facility]  Service: General;  Laterality: N/A;   INSERTION OF MESH N/A 09/08/2016   Procedure: INSERTION OF MESH;  Surgeon: TJackolyn Confer MD;  [facility]: [facility];  Service: General;  Laterality: N/A;   IR RADIOLOGIST EVAL & MGMT  11/10/2020   IR RADIOLOGIST EVAL & MGMT  11/25/2020   LAPAROSCOPIC SIGMOID COLECTOMY N/A 10/31/2019   Procedure: DIAGNOSTIC LAPAROSCOPY; EXPLORATORY LAPAROTOMY; SIGMOID COLECTOMY;  Surgeon: CErroll Luna MD;  [facility];  Service: General;  Laterality: N/A;   right rotator cuff     SUBMUCOSAL TATTOO INJECTION  10/29/2019   Procedure: SUBMUCOSAL TATTOO INJECTION;  Surgeon: PJerene Bears MD;  [facility];  Service: Gastroenterology;;   TONSILLECTOMY     UMBILICAL HERNIA REPAIR N/A 09/08/2016   Procedure: OPEN UMBILICAL HERNIA REPAIR WITH MESH;  Surgeon: Jackolyn Confer, MD;  [facility];  Service: General;  Laterality: N/A;   VENTRAL HERNIA REPAIR N/A 02/03/2021   Procedure: LAPAROSCOPIC VENTRAL HERNIA REPAIR WITH MESH;  Surgeon: [name], MD;  Location: [facility];  Service: General;  Laterality: N/A;   XI ROBOTIC ASSISTED COLOSTOMY TAKEDOWN N/A 03/11/2020   Procedure: ROBOTIC ASSISTED COLOSTOMY REVERSAL, RIGID PROCTOSCOPY;  Surgeon: Leighton Ruff, MD;  [facility]: [facility];  Service: General;  Laterality: N/A;    There were no vitals filed for this visit.    Subjective Assessment - 05/02/21 1023     Subjective Presented to ED on 8/22 after fall and head trauma, provoked by tripping over his dogs.  He hit the side of his head on some furniture; next day developed vertigo symptoms. 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 Reports has had two more falls since then. Not ambulating without AD currently. Reports he is using a RW in the mornings out of abudence of caution. In regards of dizziness, initially had a spinning sensation when he got out of the bed. This has improved. Has had a low grade HA but reports this has also improved. Patient denies vision or hearing changes. Wife reports she may think there may be hearing changes but unsure.    Patient is accompained by: Family member   Wife   Pertinent History Skin Cancer, CAD, GERD, Sleep Apnea, HTN, HLD, Vestibular Schwannoma    Limitations Standing;Walking;House hold activities    Diagnostic tests vestibular schwannoma in the right IAC    Patient Stated Goals getting in his boat; be able to walk dogs    Currently in Pain? No/denies                [facility] PT Assessment - 05/02/21 0001       Assessment   Medical Diagnosis Vertigo/Vestibular Schwannoma    Referring Provider (PT) Marin Olp, MD    Onset Date/Surgical Date 04/04/21    Next MD Visit --   has appt with ENT on 05/23/21   Prior Therapy Prior PT at this Clinic for Balance/Strength      Precautions   Precautions Fall      Restrictions   Weight Bearing Restrictions No      Balance Screen   Has the patient fallen in the past 6 months Yes    How many times? 3    Has the patient had a decrease in activity level because of a fear of falling?  Yes    Is the patient reluctant to leave their home because of a fear of falling?  Yes      [place]  Private residence    Living Arrangements Spouse/significant other    Available Help at Discharge Family    Type of [place] to enter    Entrance Stairs-Number of Steps 4-5    Entrance Stairs-Rails Right    [place] Two level;Able to live on main level with bedroom/bathroom    [place] - 2 wheels;Other (comment)   [place]     Prior Function   Level of Independence Independent    Leisure Play with Dogs; Be on Boat      Cognition   Overall Cognitive Status Within Functional Limits for  tasks assessed      Observation/Other Assessments   Focus on Therapeutic Outcomes (FOTO)  DPS: 58 DFS: 49      Sensation   Light Touch Appears Intact      Posture/Postural Control   Posture/Postural Control Postural limitations    Postural Limitations Rounded Shoulders;Forward head      ROM / Strength   AROM / PROM / Strength Strength;AROM      AROM   Overall AROM  Deficits    Overall AROM Comments decreased cervical AROM, no formal measurements      Strength   Overall Strength Deficits    Strength Assessment Site Hip;Knee;Ankle    Right/Left Hip Right;Left    Right Hip Flexion 4-/5    Right Hip ABduction 4-/5    Right Hip ADduction 4-/5    Left Hip Flexion 4-/5    Left Hip ABduction 4-/5    Left Hip ADduction 4-/5    Right/Left Knee Right;Left    Right Knee Flexion 4-/5    Right Knee Extension 4/5    Left Knee Flexion 4-/5    Left Knee Extension 4/5    Right/Left Ankle Right;Left    Right Ankle Dorsiflexion 4-/5    Left Ankle Dorsiflexion 4-/5      Transfers   Transfers Sit to Stand;Stand to Sit    Sit to Stand 5: Supervision;With upper extremity assist;From chair/3-in-1    Stand to Sit 5: Supervision;With upper extremity assist;To chair/3-in-1      Ambulation/Gait   Ambulation/Gait Yes    Ambulation/Gait Assistance 5: Supervision    Ambulation/Gait Assistance Details ambulation through therapy gym, wide BOS and shortened step length  noted. No AD utilized    Ambulation Distance (Feet) 60 Feet    Assistive device None    Gait Pattern Step-through pattern;Decreased step length - right;Decreased step length - left;Wide base of support    Ambulation Surface Level;Indoor      High Level Balance   High Level Balance Comments Completed M-CTSIB: patient able to hold situation 1: 30 secs, situation 2: 22 seconds, situation 3: 30 seconds, situation 4: unable to complete                    Vestibular Assessment - 05/02/21 0001       Symptom Behavior   Subjective history of current problem see subjective    Type of Dizziness  Unsteady with head/body turns;Spinning;Vertigo;Imbalance    Frequency of Dizziness used to be daily;    Duration of Dizziness seconds    Symptom Nature Positional    Aggravating Factors Lying supine;Supine to sit;Forward bending    Relieving Factors Slow movements    Progression of Symptoms Better    History of similar episodes None      Oculomotor Exam   Oculomotor Alignment Normal    Ocular ROM WNL    Spontaneous Absent    Gaze-induced  Left beating nystagmus with R gaze    Smooth Pursuits Saccades    Saccades Undershoots      Oculomotor Exam-Fixation Suppressed    Left Head Impulse Negative    Right Head Impulse Positive      Vestibulo-Ocular Reflex   VOR 1 Head Only (x 1 viewing) No Dizziness      Visual Acuity   Static 9    Dynamic 8   no dizziness, imbalance and postural sway     Positional Testing   Dix-Hallpike Dix-Hallpike Right;Dix-Hallpike Left    Horizontal Canal Testing Horizontal  Canal Right;Horizontal Canal Left      Dix-Hallpike Right   Dix-Hallpike Right Duration 0    Dix-Hallpike Right Symptoms No nystagmus      Dix-Hallpike Left   Dix-Hallpike Left Duration 0    Dix-Hallpike Left Symptoms No nystagmus      Horizontal Canal Right   Horizontal Canal Right Duration 0    Horizontal Canal Right Symptoms Normal      Horizontal Canal Left   Horizontal  Canal Left Duration 0    Horizontal Canal Left Symptoms Normal      Positional Sensitivities   Sit to Supine Lightheadedness    Right Hallpike No dizziness    Up from Right Hallpike Lightheadedness    Up from Left Hallpike Lightheadedness    Rolling Right No dizziness    Rolling Left No dizziness                Objective measurements completed on examination: See above findings.                PT Education - 05/02/21 1030     Education Details Educated on [employer]) Educated Patient;Spouse    Methods Explanation    Comprehension Verbalized understanding              PT Short Term Goals - 05/02/21 1131       PT SHORT TERM GOAL #1   Title Pt to be independent wtih initial HEP for strength/balance/vestibular (ALL STGs Due: 05/27/21)    Baseline no HEP established    Time 3    Period Weeks    Status New    Target Date 05/27/21      PT SHORT TERM GOAL #2   Title FGA TBA and LTG to be set as applicable    Baseline TBA    Time 3    Period Weeks    Status New      PT SHORT TERM GOAL #3   Title Patient will be able to hold situation 2 of M-CTSIB for full 30 seocnds to demo improved balance    Baseline 22 seconds    Time 3    Period Weeks    Status New               PT Long Term Goals - 05/02/21 1133       PT LONG TERM GOAL #1   Title Pt to be independent wtih final HEPand walking program (ALL LTGs Due: 06/17/21)    Baseline no HEP established    Time 6    Period Weeks    Status New    Target Date 06/17/21      PT LONG TERM GOAL #2   Title LTG to be set for FGA    Baseline TBA    Time 6    Period Weeks    Status New      PT LONG TERM GOAL #3   Title Patient will be able to hold situation 4 of M-CTSIB for >/= 15 seconds to demo improved vestibular input for balance    Baseline unable to complete    Time 6    Period Weeks    Status New      PT LONG TERM GOAL #4   Title LTG to be set for MSQ if  applicable    Baseline TBA    Time 6    Period Weeks    Status New  PT LONG TERM GOAL #5   Title Patient will be able to ambulate >/= 1000 ft outdoors unlevel surfaces without AD and Mod I for improved ability for community mobility    Baseline using SPC outdoors at times    Time 6    Period Weeks    Status New      Additional Long Term Goals   Additional Long Term Goals Yes      PT LONG TERM GOAL #6   Title Patient will improve DFS >/= 59    Baseline 49    Time 6    Period Weeks    Status New                    Plan - 05/02/21 1136     Clinical Impression Statement Patient is a 80 y.o male referred to Neuro OPPT services for Vertigo/Vestibular Schwannoma. Patient's PMH significant for the following: Skin Cancer, CAD, GERD, Sleep Apnea, HTN, HLD, Vestibular Schwannoma. Upon evaluation patient presents with the following impairments: abnormal gait, muscle weakness in BLE, dizziness, motion senstivity, positive R HIT indicated impaired VOR, and imbalance. Patient demo decreased vestibular input for balance on M-CTSIB with inability to complete situation 4. Patient had negative positional testing indicating no evidence of BPPV at this time. Patient will benefit from skilled PT services to address impairments and reduce fall risk.    Personal Factors and Comorbidities Comorbidity 3+;Age    Comorbidities Skin Cancer, CAD, GERD, Sleep Apnea, HTN, HLD, Vestibular Schwannoma    Examination-Activity Limitations Bed Mobility;Bend;Caring for Others;Stairs    Examination-Participation Restrictions [employer] Activity    Stability/Clinical Decision Making Stable/Uncomplicated    Clinical Decision Making Low    Rehab Potential Good    PT Frequency 2x / week    PT Duration 6 weeks    PT Treatment/Interventions ADLs/Self Care Home Management;Aquatic Therapy;Canalith Repostioning;Cryotherapy;Moist Heat;DME Instruction;Electrical Stimulation;Gait training;Stair  training;Functional mobility training;Therapeutic activities;Therapeutic exercise;Balance training;Neuromuscular re-education;Patient/family education;Vestibular;Dry needling;Manual techniques;Passive range of motion;Joint Manipulations    PT Next Visit Plan [facility]. Assess FGA. 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[place]) 04/11/2021   B12 deficiency 01/06/2021   Vitamin D deficiency 01/06/2021   Aortic atherosclerosis ([place]) 01/06/2021   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[place]) 01/06/2021   Allergic rhinitis 01/06/2021   BPH associated with nocturia 01/06/2021   Abdominal wall abscess 10/28/2020   S/P hernia repair 10/13/2020   Osteoarthritis of glenohumeral joint, left 09/21/2017   Non-traumatic rotator cuff tear 09/21/2017   CAD (coronary artery disease) 08/12/2013   Essential hypertension 03/15/2011   GERD (gastroesophageal reflux disease) 03/15/2011    Jones Bales, PT, DPT 05/02/2021, 11:42 AM  [facility] [address] [place], [place], [address] Phone: [phone]   Fax:  [phone]  Name: Wesley [name] MRN: [id number] Date of Birth: February 09, 1941

## 2021-05-03 ENCOUNTER — Encounter: Payer: Medicare Other | Admitting: Physical Therapy

## 2021-05-06 ENCOUNTER — Ambulatory Visit: Payer: Medicare Other

## 2021-05-07 ENCOUNTER — Ambulatory Visit (INDEPENDENT_AMBULATORY_CARE_PROVIDER_SITE_OTHER): Payer: Medicare Other | Admitting: Cardiology

## 2021-05-07 ENCOUNTER — Encounter: Payer: Self-pay | Admitting: Cardiology

## 2021-05-07 DIAGNOSIS — G4730 Sleep apnea, unspecified: Secondary | ICD-10-CM | POA: Insufficient documentation

## 2021-05-07 DIAGNOSIS — Z0000 Encounter for general adult medical examination without abnormal findings: Secondary | ICD-10-CM

## 2021-05-07 NOTE — Patient Instructions (Signed)
Health Maintenance, Male Adopting a healthy lifestyle and getting preventive care are important in promoting health and wellness. Ask your health care provider about: The right schedule for you to have regular tests and exams. Things you can do on your own to prevent diseases and keep yourself healthy. What should I know about diet, weight, and exercise? Eat a healthy diet  Eat a diet that includes plenty of vegetables, fruits, low-fat dairy products, and lean protein. Do not eat a lot of foods that are high in solid fats, added sugars, or sodium. Maintain a healthy weight Body mass index (BMI) is a measurement that can be used to identify possible weight problems. It estimates body fat based on height and weight. Your health care provider can help determine your BMI and help you achieve or maintain a healthy weight. Get regular exercise Get regular exercise. This is one of the most important things you can do for your health. Most adults should: Exercise for at least 150 minutes each week. The exercise should increase your heart rate and make you sweat (moderate-intensity exercise). Do strengthening exercises at least twice a week. This is in addition to the moderate-intensity exercise. Spend less time sitting. Even light physical activity can be beneficial. Watch cholesterol and blood lipids Have your blood tested for lipids and cholesterol at 79 years of age, then have this test every 5 years. You may need to have your cholesterol levels checked more often if: Your lipid or cholesterol levels are high. You are older than 79 years of age. You are at high risk for heart disease. What should I know about cancer screening? Many types of cancers can be detected early and may often be prevented. Depending on your health history and family history, you may need to have cancer screening at various ages. This may include screening for: Colorectal cancer. Prostate cancer. Skin cancer. Lung  cancer. What should I know about heart disease, diabetes, and high blood pressure? Blood pressure and heart disease High blood pressure causes heart disease and increases the risk of stroke. This is more likely to develop in people who have high blood pressure readings, are of African descent, or are overweight. Talk with your health care provider about your target blood pressure readings. Have your blood pressure checked: Every 3-5 years if you are 18-39 years of age. Every year if you are 40 years old or older. If you are between the ages of 65 and 75 and are a current or former smoker, ask your health care provider if you should have a one-time screening for abdominal aortic aneurysm (AAA). Diabetes Have regular diabetes screenings. This checks your fasting blood sugar level. Have the screening done: Once every three years after age 45 if you are at a normal weight and have a low risk for diabetes. More often and at a younger age if you are overweight or have a high risk for diabetes. What should I know about preventing infection? Hepatitis B If you have a higher risk for hepatitis B, you should be screened for this virus. Talk with your health care provider to find out if you are at risk for hepatitis B infection. Hepatitis C Blood testing is recommended for: Everyone born from 1945 through 1965. Anyone with known risk factors for hepatitis C. Sexually transmitted infections (STIs) You should be screened each year for STIs, including gonorrhea and chlamydia, if: You are sexually active and are younger than 79 years of age. You are older than 79 years   of age and your health care provider tells you that you are at risk for this type of infection. Your sexual activity has changed since you were last screened, and you are at increased risk for chlamydia or gonorrhea. Ask your health care provider if you are at risk. Ask your health care provider about whether you are at high risk for HIV.  Your health care provider may recommend a prescription medicine to help prevent HIV infection. If you choose to take medicine to prevent HIV, you should first get tested for HIV. You should then be tested every 3 months for as long as you are taking the medicine. Follow these instructions at home: Lifestyle Do not use any products that contain nicotine or tobacco, such as cigarettes, e-cigarettes, and chewing tobacco. If you need help quitting, ask your health care provider. Do not use street drugs. Do not share needles. Ask your health care provider for help if you need support or information about quitting drugs. Alcohol use Do not drink alcohol if your health care provider tells you not to drink. If you drink alcohol: Limit how much you have to 0-2 drinks a day. Be aware of how much alcohol is in your drink. In the [place] one drink equals one 12 oz bottle of beer (355 mL), one 5 oz glass of wine (148 mL), or one 1 oz glass of hard liquor (44 mL). General instructions Schedule regular health, dental, and eye exams. Stay current with your vaccines. Tell your health care provider if: You often feel depressed. You have ever been abused or do not feel safe at home. Summary Adopting a healthy lifestyle and getting preventive care are important in promoting health and wellness. Follow your health care provider's instructions about healthy diet, exercising, and getting tested or screened for diseases. Follow your health care provider's instructions on monitoring your cholesterol and blood pressure. This information is not intended to replace advice given to you by your health care provider. Make sure you discuss any questions you have with your health care provider. Document Revised: 10/08/2020 Document Reviewed: 07/24/2018 Elsevier Patient Education  2022 [employer]vier Inc.  

## 2021-05-07 NOTE — Progress Notes (Signed)
Subjective:   Wesley [name] is a 80 y.o. male who presents for an Initial Medicare Annual Wellness Visit.  I connected with  Wesley [name] on 05/07/21 by a audio enabled telemedicine application and verified that I am speaking with the correct person using two identifiers.   I discussed the limitations of evaluation and management by telemedicine. The patient expressed understanding and agreed to proceed.   Patient Location: Home Provider location: Office  Persons participating in visit: Wesley [name] and Rennis Harding, RN  Review of Systems    Defer to PCP Cardiac Risk Factors include: advanced age (>19men, >34 women);male gender;hypertension     Objective:    Today's Vitals   05/07/21 1116  PainSc: 0-No pain   There is no height or weight on file to calculate BMI.  Advanced Directives 05/07/2021 05/02/2021 04/08/2021 04/04/2021 03/08/2021 10/28/2020 10/28/2020  Does Patient Have a Medical Advance Directive? No No No No No No No  Would patient like information on creating a medical advance directive? No - Patient declined No - Patient declined No - Patient declined - No - Patient declined No - Patient declined No - Patient declined  Pre-existing out of facility DNR order (yellow form or pink MOST form) - - - - - - -    Current Medications (verified) Outpatient Encounter Medications as of 05/07/2021  Medication Sig   aspirin EC 81 MG tablet Take 81 mg by mouth daily. Swallow whole.   atorvastatin (LIPITOR) 80 MG tablet TAKE 1 TABLET EVERY DAY   Cholecalciferol (VITAMIN D3) 25 MCG (1000 UT) CAPS Take 1,000 Units by mouth daily.   finasteride (PROSCAR) 5 MG tablet Take 5 mg by mouth daily.   lisinopril (ZESTRIL) 10 MG tablet Take 10 mg by mouth daily.   loratadine (CLARITIN) 10 MG tablet Take 10 mg by mouth daily.   magnesium oxide (MAG-OX) 400 MG tablet Take 400 mg by mouth daily.   meclizine (ANTIVERT) 25 MG tablet Take 1 tablet (25 mg total) by mouth 3 (three) times  daily as needed for up to 15 doses for dizziness.   Omega-3 Fatty Acids (FISH OIL) 1200 MG CAPS Take 1,200 mg by mouth daily.   [DISCONTINUED] LORazepam (ATIVAN) 0.5 MG tablet Take 1 tablet (0.5 mg total) by mouth every 8 (eight) hours as needed for anxiety. (Patient not taking: No sig reported)   No facility-administered encounter medications on file as of 05/07/2021.    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  PT denies significant findings   CARDIAC CATHETERIZATION  2008   COLOSTOMY N/A 10/31/2019   Procedure: COLOSTOMY;  Surgeon: Erroll Luna, MD;  Location: [facility];  Service: General;  Laterality: N/A;   CYSTOSCOPY W/ URETERAL STENT PLACEMENT Bilateral 10/31/2019   Procedure: CYSTOSCOPY URETERAL STENT PLACEMENT BILATERAL;  Surgeon: Franchot Gallo, MD;  Location: [facility];  Service: Urology;  Laterality: Bilateral;   CYSTOSCOPY WITH STENT PLACEMENT Bilateral 03/11/2020   Procedure: CYSTOSCOPY WITH BILATERAL FIREFLY INJECTION;  Surgeon: Irine Seal, MD;  Location: [facility];  Service: Urology;   Laterality: Bilateral;   EYE SURGERY     BILATERAL CATARACT SURGERY WITH LENS IMPLANTS   FLEXIBLE SIGMOIDOSCOPY N/A 10/29/2019   Procedure: FLEXIBLE SIGMOIDOSCOPY;  Surgeon: Jerene Bears, MD;  [facility];  Service: Gastroenterology;  Laterality: N/A;   INCISIONAL HERNIA REPAIR N/A 10/13/2020   Procedure: OPEN INCISIONAL HERNIA REPAIR WITH MESH;  Surgeon: [name], MD;  Location: [facility];  Service: General;  Laterality: N/A;   INSERTION OF MESH N/A 09/08/2016   Procedure: INSERTION OF MESH;  Surgeon: Jackolyn Confer, MD;  Location: [facility];  Service: General;  Laterality: N/A;   IR RADIOLOGIST EVAL & MGMT  11/10/2020   IR RADIOLOGIST EVAL & MGMT  11/25/2020   LAPAROSCOPIC SIGMOID COLECTOMY N/A 10/31/2019   Procedure: DIAGNOSTIC LAPAROSCOPY; EXPLORATORY LAPAROTOMY; SIGMOID COLECTOMY;  Surgeon: Erroll Luna, MD;  Location[facility];  Service: General;  Laterality: N/A;   right rotator cuff     SUBMUCOSAL TATTOO INJECTION  10/29/2019   Procedure: SUBMUCOSAL TATTOO INJECTION;  Surgeon: Jerene Bears, MD;  [facility];  Service: Gastroenterology;;   TONSILLECTOMY     UMBILICAL HERNIA REPAIR N/A 09/08/2016   Procedure: OPEN UMBILICAL HERNIA REPAIR WITH MESH;  Surgeon: Jackolyn Confer, MD;  [facility];  Service: General;  Laterality: N/A;   VENTRAL HERNIA REPAIR N/A 02/03/2021   Procedure: LAPAROSCOPIC VENTRAL HERNIA REPAIR WITH MESH;  Surgeon: [name], MD;  Location: [facility];  Service: General;  Laterality: N/A;   XI ROBOTIC ASSISTED COLOSTOMY TAKEDOWN N/A 03/11/2020   Procedure: ROBOTIC ASSISTED COLOSTOMY REVERSAL, RIGID PROCTOSCOPY;  Surgeon: Leighton Ruff, MD;  Location[facility];  Service: General;  Laterality: N/A;   Family History  Problem Relation Age of Onset   Diabetes Mother    Heart failure Mother        around 3   CAD Father        2   Melanoma Brother 37   Healthy Daughter    Healthy Son    Colon cancer Neg Hx    Esophageal cancer Neg Hx     Rectal cancer Neg Hx    Stomach cancer Neg Hx    Social History   Socioeconomic History   Marital status: Married    Spouse name: Artavius Stearns    Number of children: 2   Years of education: 12   Highest education level: Master's degree (e.g., MA, MS, MEng, MEd, MSW, MBA)  Occupational History   Occupation: Retired   Tobacco Use   Smoking status: Never   Smokeless tobacco: Never  Vaping Use   Vaping Use: Never used  Substance and Sexual Activity   Alcohol use: Yes    Alcohol/week: 17.0 standard drinks    Types: 3 Glasses of wine, 14 Standard drinks or equivalent per week   Drug use: No   Sexual activity: Not Currently  Other Topics Concern   Not on file  Social History Narrative  Married. 2 children 52 in 81 in 2022- both went to [facility]. 5 grandkids- all girls from oldest 53 looking premed [employer], 15 soph [place], 50 25 year olds, 11 year olds.       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 [employer]: fishing, [employer] football and basketball, [place], [place] Strain: Low Risk    Difficulty of [place]: Not hard at [place]: No Food Insecurity   Worried About Charity fundraiser in [place]: Never true   Arboriculturist in the Last Year: Never true  Transportation Needs: No Transportation Needs   Lack of Transportation (Medical): No   Lack of Transportation (Non-Medical): No  Physical Activity: Inactive   Days of Exercise per Week: 0 days   Minutes of Exercise per Session: 0 min  Stress: No Stress Concern Present   Feeling of Stress : Not at all  Social Connections: Socially Integrated   Frequency of Communication with Friends and Family: More than three times a week   Frequency of Social Gatherings with Friends and Family: Once a week   Attends Religious  Services: More than 4 times per year   Active Member of [employer] or Organizations: Yes   Attends Music therapist: More than 4 times per year   Marital Status: Married    Tobacco Counseling Counseling given: Not Answered   Clinical Intake:  Pre-visit preparation completed: Yes  Pain Score: 0-No pain     Nutritional Risks: None Diabetes: No  How often do you need to have someone help you when you read instructions, pamphlets, or other written materials from your doctor or pharmacy?: 1 - Never What is the last grade level you completed in school?: 17 1/2 years  Diabetic;No  Interpreter Needed?: No  Information entered by :: Rennis Harding, RN   Activities of Daily Living In your present state of health, do you have any difficulty performing the following activities: 05/07/2021 02/03/2021  Hearing? N -  Vision? N -  Difficulty concentrating or making decisions? N -  Walking or climbing stairs? N -  Dressing or bathing? N -  Doing errands, shopping? N N  Preparing Food and eating ? N -  Using the Toilet? N -  In the past six months, have you accidently leaked urine? N -  Do you have problems with loss of bowel control? N -  Managing your Medications? N -  Managing your Finances? N -  Some recent data might be hidden    Patient Care Team: Marin Olp, MD as PCP - General (Family Medicine) Jerline Pain, MD as PCP - Cardiology (Cardiology) [name], MD as Consulting Physician (General Surgery)  Indicate any recent Medical Services you may have received from other than [facility] providers in the past year (date may be approximate).     Assessment:   This is a routine wellness examination for Wesley [name].  Hearing/Vision screen No results found.  Dietary issues and exercise activities discussed: Current Exercise Habits: The patient does not participate in regular exercise at present, Exercise limited by: Other - see comments (currently  vertigo)   Goals Addressed             This Visit's Progress    Activity and  Exercise Increased       Evidence-based guidance:  Review current exercise levels.  Assess patient perspective on exercise or activity level, barriers to increasing activity, motivation and readiness for change.  Recommend or set healthy exercise goal based on individual tolerance.  Encourage small steps toward making change in amount of exercise or activity.  Urge reduction of sedentary activities or screen time.  Promote group activities within the community or with family or support person.  Consider referral to rehabiliation therapist for assessment and exercise/activity plan.   Notes:       Depression Screen PHQ 2/9 Scores 05/07/2021 04/11/2021 01/06/2021  PHQ - 2 Score 0 0 0  PHQ- 9 Score 0 0 6    Fall Risk Fall Risk  05/07/2021 04/11/2021 01/06/2021  Falls in the past year? 1 1 0  Number falls in past yr: 1 1 0  Injury with Fall? 0 1 0  Risk for fall due to : Other (Comment) Other (Comment) -  Risk for fall due to: Comment currently experiencing vertigo - -  Follow up Falls prevention discussed Falls evaluation completed -  Comment currenlty under MD care for it, also getting physical therapy - -    FALL RISK PREVENTION PERTAINING TO THE HOME:  Any stairs in or around the home? Yes  If so, are there any without handrails? Yes  Home free of loose throw rugs in walkways, pet beds, electrical cords, etc? Yes  Adequate lighting in your home to reduce risk of falls? Yes   ASSISTIVE DEVICES UTILIZED TO PREVENT FALLS:  Life alert? No  Use of a cane, walker or w/c? Yes  walker 1st this in the morning Grab bars in the bathroom? No  Shower chair or bench in shower? Yes  Elevated toilet seat or a handicapped toilet? Yes   TIMED UP AND GO:  Was the test performed?  N/A .  Length of time to ambulate 10 feet: N/A sec.     Cognitive Function:     6CIT Screen 05/07/2021  What Year? 0 points   What month? 0 points  What time? 0 points  Count back from 20 0 points  Months in reverse 0 points  Repeat phrase 0 points  Total Score 0    Immunizations Immunization History  Administered Date(s) Administered   Influenza, High Dose Seasonal PF 06/10/2014, 06/28/2015, 04/18/2016, 07/27/2017, 06/04/2018   Influenza, Quadrivalent, Recombinant, Inj, Pf 06/04/2018, 05/13/2019   Influenza-Unspecified 04/29/2012, 07/14/2013, 05/14/2018   PFIZER(Purple Top)SARS-COV-2 Vaccination 09/08/2019, 09/29/2019, 05/13/2020, 12/13/2020   Pneumococcal Conjugate-13 01/05/2015, 05/13/2019   Pneumococcal Polysaccharide-23 09/20/2011   Td 04/29/2006, 12/08/2019   Zoster Recombinat (Shingrix) 04/06/2020   Zoster, Live 04/29/2008    TDAP status: Up to date  Flu Vaccine status: Due, Education has been provided regarding the importance of this vaccine. Advised may receive this vaccine at local pharmacy or Health Dept. Aware to provide a copy of the vaccination record if obtained from local pharmacy or Health Dept. Verbalized acceptance and understanding.  Pneumococcal vaccine status: Due, Education has been provided regarding the importance of this vaccine. Advised may receive this vaccine at local pharmacy or Health Dept. Aware to provide a copy of the vaccination record if obtained from local pharmacy or Health Dept. Verbalized acceptance and understanding.  Covid-19 vaccine status: Completed vaccines  Qualifies for Shingles Vaccine? Yes   Zostavax completed No   Shingrix Completed?: No.    Education has been provided regarding the importance of this vaccine. Patient has been  advised to call insurance company to determine out of pocket expense if they have not yet received this vaccine. Advised may also receive vaccine at local pharmacy or Health Dept. Verbalized acceptance and understanding.  Screening Tests Health Maintenance  Topic Date Due   Zoster Vaccines- Shingrix (2 of 2) 06/01/2020    INFLUENZA VACCINE  03/14/2021   COVID-19 Vaccine (5 - Booster for Pfizer series) 04/15/2021   TETANUS/TDAP  12/07/2029   Hepatitis C Screening  Completed   HPV VACCINES  Aged Out    Health Maintenance  Health Maintenance Due  Topic Date Due   Zoster Vaccines- Shingrix (2 of 2) 06/01/2020   INFLUENZA VACCINE  03/14/2021   COVID-19 Vaccine (5 - Booster for Pfizer series) 04/15/2021    Colorectal cancer screening: Type of screening: Colonoscopy. Completed 01/2020. Repeat every ? years  Lung Cancer Screening: (Low Dose CT Chest recommended if Age 11-80 years, 30 pack-year currently smoking OR have quit w/in 15years.) does not qualify.   Lung Cancer Screening Referral: N/A  Additional Screening:  Hepatitis C Screening: does not qualify; Completed n/a  Vision Screening: Recommended annual ophthalmology exams for Wesley detection of glaucoma and other disorders of the eye. Is the patient up to date with their annual eye exam?   Who is the provider or what is the name of the office in which theYes  patient attends annual eye exams? Dr Jari Pigg If pt is not established with a provider, would they like to be referred to a provider to establish care? No .   Dental Screening: Recommended annual dental exams for proper oral hygiene  Community Resource Referral / Chronic Care Management: CRR required this visit?  No   CCM required this visit?  No      Plan:     I have personally reviewed and noted the following in the patient's chart:   Medical and social history Use of alcohol, tobacco or illicit drugs  Current medications and supplements including opioid prescriptions. Patient is not currently taking opioid prescriptions. Functional ability and status Nutritional status Physical activity Advanced directives List of other physicians Hospitalizations, surgeries, and ER visits in previous 12 months Vitals Screenings to include cognitive, depression, and falls Referrals and  appointments  In addition, I have reviewed and discussed with patient certain preventive protocols, quality metrics, and best practice recommendations. A written personalized care plan for preventive services as well as general preventive health recommendations were provided to patient.   Patient scheduled for office visit in Nov 2023 with Dr Yong Channel.  He will flu shot, another covid vaccine, 2nd shingles vaccine and discuss follow up for colonoscopy.    Rennis Harding, RN   05/07/2021   Nurse Notes:  Non-Face to Face 40 minutes   Mr. Kleven , Thank you for taking time to come for your Medicare Wellness Visit. I appreciate your ongoing commitment to your health goals. Please review the following plan we discussed and let me know if I can assist you in the future.   These are the goals we discussed:  Goals      Activity and Exercise Increased     Evidence-based guidance:  Review current exercise levels.  Assess patient perspective on exercise or activity level, barriers to increasing activity, motivation and readiness for change.  Recommend or set healthy exercise goal based on individual tolerance.  Encourage small steps toward making change in amount of exercise or activity.  Urge reduction of sedentary activities or screen time.  Promote group activities  within the community or with family or support person.  Consider referral to rehabiliation therapist for assessment and exercise/activity plan.   Notes:         This is a list of the screening recommended for you and due dates:  Health Maintenance  Topic Date Due   Zoster (Shingles) Vaccine (2 of 2) 06/01/2020   Flu Shot  03/14/2021   COVID-19 Vaccine (5 - Booster for Pfizer series) 04/15/2021   Tetanus Vaccine  12/07/2029   Hepatitis C Screening: USPSTF Recommendation to screen - Ages 18-79 yo.  Completed   HPV Vaccine  Aged Out

## 2021-05-10 ENCOUNTER — Other Ambulatory Visit: Payer: Self-pay

## 2021-05-10 ENCOUNTER — Ambulatory Visit: Payer: Medicare Other | Admitting: Physical Therapy

## 2021-05-10 DIAGNOSIS — R42 Dizziness and giddiness: Secondary | ICD-10-CM | POA: Diagnosis not present

## 2021-05-10 DIAGNOSIS — R2681 Unsteadiness on feet: Secondary | ICD-10-CM

## 2021-05-10 DIAGNOSIS — M6281 Muscle weakness (generalized): Secondary | ICD-10-CM

## 2021-05-10 DIAGNOSIS — R2689 Other abnormalities of gait and mobility: Secondary | ICD-10-CM

## 2021-05-11 NOTE — Therapy (Signed)
[facility] [address] [place], [place], [address] Phone: [phone]   Fax:  [phone]  Physical Therapy Treatment  Patient Details  Name: Wesley [name] MRN: [id number] Date of Birth: [birth year] Referring Provider (PT): Marin Olp, MD   Encounter Date: 05/10/2021   PT End of Session - 05/11/21 1445     Visit Number 2    Number of Visits 13    Date for PT Re-Evaluation 06/17/21    Authorization Type Medicare A+B/[employer]    Progress Note Due on Visit 10    PT Start Time 1106   pt arrived 6" late   PT Stop Time 1145    PT Time Calculation (min) 39 min    Activity Tolerance Patient tolerated treatment well    Behavior During Therapy WFL for tasks assessed/performed             Past Medical History:  Diagnosis Date   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 symptoms (LUTS)    CAD (coronary artery disease)    Cholelithiasis    Chronic allergic rhinitis    Coronary atherosclerosis of native coronary artery    Fatty liver    GERD (gastroesophageal reflux disease)    HX 16yrs ago, no longer a problem, no meds   High blood pressure    Hypercholesterolemia    Macrocytosis without anemia    OSA on CPAP    Paraesophageal hernia    Partial bowel obstruction (HCC)    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 PT denies significant findings   CARDIAC CATHETERIZATION  2008   COLOSTOMY N/A 10/31/2019   Procedure: COLOSTOMY;  Surgeon: Erroll Luna, MD;  [facility]: [facility];  Service: General;  Laterality: N/A;   CYSTOSCOPY W/ URETERAL STENT PLACEMENT Bilateral 10/31/2019   Procedure:  CYSTOSCOPY URETERAL STENT PLACEMENT BILATERAL;  Surgeon: Franchot Gallo, MD;  [facility];  Service: Urology;  Laterality: Bilateral;   CYSTOSCOPY WITH STENT PLACEMENT Bilateral 03/11/2020   Procedure: CYSTOSCOPY WITH BILATERAL FIREFLY INJECTION;  Surgeon: Irine Seal, MD;  [facility];  Service: Urology;  Laterality: Bilateral;   EYE SURGERY     BILATERAL CATARACT SURGERY WITH LENS IMPLANTS   FLEXIBLE SIGMOIDOSCOPY N/A 10/29/2019   Procedure: FLEXIBLE SIGMOIDOSCOPY;  Surgeon: Jerene Bears, MD;  [facility];  Service: Gastroenterology;  Laterality: N/A;   INCISIONAL HERNIA REPAIR N/A 10/13/2020   Procedure: OPEN INCISIONAL HERNIA REPAIR WITH MESH;  Surgeon: [name], MD;  Location: [facility];  Service: General;  Laterality: N/A;   INSERTION OF MESH N/A 09/08/2016   Procedure: INSERTION OF MESH;  Surgeon: Jackolyn Confer, MD;  [facility];  Service: General;  Laterality: N/A;   IR RADIOLOGIST EVAL & MGMT  11/10/2020   IR RADIOLOGIST EVAL & MGMT  11/25/2020   LAPAROSCOPIC SIGMOID COLECTOMY N/A 10/31/2019   Procedure: DIAGNOSTIC LAPAROSCOPY; EXPLORATORY LAPAROTOMY; SIGMOID COLECTOMY;  Surgeon: Erroll Luna, MD;  [facility];  Service: General;  Laterality: N/A;   right rotator cuff     SUBMUCOSAL TATTOO INJECTION  10/29/2019   Procedure: SUBMUCOSAL TATTOO INJECTION;  Surgeon: Jerene Bears, MD;  [facility];  Service: Gastroenterology;;   TONSILLECTOMY     UMBILICAL HERNIA REPAIR N/A 09/08/2016   Procedure: OPEN UMBILICAL HERNIA REPAIR WITH MESH;  Surgeon: Jackolyn Confer, MD;  [facility];  Service: General;  Laterality: N/A;   VENTRAL HERNIA REPAIR N/A 02/03/2021   Procedure: LAPAROSCOPIC VENTRAL HERNIA REPAIR WITH MESH;  Surgeon: [name], MD;  Location: [facility];  Service: General;  Laterality: N/A;   XI ROBOTIC ASSISTED COLOSTOMY TAKEDOWN N/A 03/11/2020   Procedure: ROBOTIC ASSISTED COLOSTOMY REVERSAL, RIGID PROCTOSCOPY;  Surgeon: Leighton Ruff, MD;  [facility];  Service: General;  Laterality: N/A;    There were no vitals filed for this visit.   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 No/denies                     Vestibular Assessment - 05/11/21 0001       Dix-Hallpike Right   Dix-Hallpike Right Duration none    Dix-Hallpike Right Symptoms No nystagmus      Dix-Hallpike Left   Dix-Hallpike Left Duration none    Dix-Hallpike Left Symptoms No nystagmus      Positional Sensitivities   Sit to Supine Lightheadedness    Supine to Left Side No dizziness    Supine to Right Side Lightheadedness    Supine to Sitting Lightheadedness    Right Hallpike No dizziness    Up from Right Hallpike Lightheadedness    Up from Left Hallpike Lightheadedness    Nose to Right Knee Lightheadedness    Right Knee to Sitting Lightedness    Nose to Left Knee No dizziness    Left Knee to Sitting No dizziness    Head Turning x 5 No dizziness    Head Nodding x 5 Lightheadedness    Pivot Right in Standing Lightheadedness    Pivot Left in Standing Lightheadedness    Rolling Right No dizziness    Rolling Left No dizziness             Medbridge HEP - [id number]  Pt performed sit to stand, bil. Heel raise, standing alternating hip abduction and hip extension Marching in place Marching with horizontal head turns with CGA - pt was instructed to hold onto chair or counter at home When performing this exercise           [facility] Adult PT Treatment/Exercise - 05/11/21 0001       Transfers   Transfers Sit to Stand;Stand to Sit    Sit to Stand 5: Supervision    Stand to Sit 5: Supervision    Comments no UE support used from mat table                     PT Education - 05/11/21 1439     Education Details recommended pt begin walking program - start with 5" and gradually increase time; Medbridge HEP [id number]  Person(s) Educated Patient;Spouse    Methods Explanation;Demonstration;Handout    Comprehension Verbalized understanding;Returned demonstration              PT Short Term Goals - 05/11/21 1452       PT SHORT TERM GOAL #1   Title Pt to be independent wtih initial HEP for strength/balance/vestibular (ALL STGs Due: 05/27/21)    Baseline no HEP established    Time 3    Period Weeks    Status New    Target Date 05/27/21      PT SHORT TERM GOAL #2   Title FGA TBA and LTG to be set as applicable    Baseline TBA    Time 3    Period Weeks    Status New      PT SHORT TERM GOAL #3   Title Patient will be able to hold situation 2 of M-CTSIB for full 30 seocnds to demo improved balance    Baseline 22 seconds    Time 3    Period Weeks    Status New               PT Long Term Goals - 05/11/21 1452       PT LONG TERM GOAL #1   Title Pt to be independent wtih final HEPand walking program (ALL LTGs Due: 06/17/21)    Baseline no HEP established    Time 6    Period Weeks    Status New      PT LONG TERM GOAL #2   Title LTG to be set for FGA    Baseline TBA    Time 6    Period Weeks    Status New      PT LONG TERM GOAL #3   Title Patient will be able to hold situation 4 of M-CTSIB for >/= 15 seconds to demo improved vestibular input for balance    Baseline unable to complete    Time 6    Period Weeks    Status New      PT LONG TERM GOAL #4   Title LTG to be set for MSQ if applicable    Baseline TBA    Time 6    Period Weeks    Status New       PT LONG TERM GOAL #5   Title Patient will be able to ambulate >/= 1000 ft outdoors unlevel surfaces without AD and Mod I for improved ability for community mobility    Baseline using SPC outdoors at times    Time 6    Period Weeks    Status New      PT LONG TERM GOAL #6   Title Patient will improve DFS >/= 59    Baseline 49    Time 6    Period Weeks    Status New                   Plan - 05/11/21 1446     Clinical Impression Statement Skilled PT session focused on completion of MSQ and instruction in HEP per pt's request.  Pt presents with decreased balance and decreased safety with dynamic gait due to balance deficits due to decreased vestibular input.  Pt has postural instability with marching on floor with head turns.  Pt also demonstrates bil plantarflexor weakness with pt unable to achieve full ROM of plantarflexion in standing with bil. LE's with bil. UE support.  Cont with POC.    Personal  Factors and Comorbidities Comorbidity 3+;Age    Comorbidities Skin Cancer, CAD, GERD, Sleep Apnea, HTN, HLD, Vestibular Schwannoma    Examination-Activity Limitations Bed Mobility;Bend;Caring for Others;Stairs    Examination-Participation Restrictions [employer] Activity    Stability/Clinical Decision Making Stable/Uncomplicated    Rehab Potential Good    PT Frequency 2x / week    PT Duration 6 weeks    PT Treatment/Interventions ADLs/Self Care Home Management;Aquatic Therapy;Canalith Repostioning;Cryotherapy;Moist Heat;DME Instruction;Electrical Stimulation;Gait training;Stair training;Functional mobility training;Therapeutic activities;Therapeutic exercise;Balance training;Neuromuscular re-education;Patient/family education;Vestibular;Dry needling;Manual techniques;Passive range of motion;Joint Manipulations    PT Next Visit Plan FGA; add x1 viewing to HEP check HEP for any questions    PT [place] and Agree with Plan of Care Patient              Patient will benefit from skilled therapeutic intervention in order to improve the following deficits and impairments:  Difficulty walking, Abnormal gait, Decreased balance, Decreased endurance, Decreased strength, Decreased knowledge of use of DME, Decreased activity tolerance, Dizziness, Decreased range of motion  Visit Diagnosis: Unsteadiness on feet  Other abnormalities of gait and mobility  Muscle weakness (generalized)  Dizziness and giddiness     Problem List Patient Active Problem List   Diagnosis Date Noted   Sleep apnea 05/07/2021   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   B12 deficiency 01/06/2021   Vitamin D deficiency 01/06/2021   Aortic atherosclerosis ([place]) 01/06/2021   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 [place], [place], [address] Phone: [phone]   Fax:  [phone]  Name: Wesley [name] MRN: [id number] Date of Birth: 1941-04-30

## 2021-05-12 ENCOUNTER — Other Ambulatory Visit: Payer: Self-pay

## 2021-05-12 ENCOUNTER — Ambulatory Visit: Payer: Medicare Other | Admitting: Physical Therapy

## 2021-05-12 DIAGNOSIS — R2689 Other abnormalities of gait and mobility: Secondary | ICD-10-CM

## 2021-05-12 DIAGNOSIS — R2681 Unsteadiness on feet: Secondary | ICD-10-CM | POA: Diagnosis not present

## 2021-05-12 DIAGNOSIS — R42 Dizziness and giddiness: Secondary | ICD-10-CM | POA: Diagnosis not present

## 2021-05-12 DIAGNOSIS — M6281 Muscle weakness (generalized): Secondary | ICD-10-CM | POA: Diagnosis not present

## 2021-05-12 NOTE — Therapy (Signed)
[facility] [address] [place], [place], [address] Phone: [phone]   Fax:  [phone]  Physical Therapy Treatment  Patient Details  Name: Wesley [name] MRN: [id number] Date of Birth: 03/30/1941 Referring Provider (PT): Marin Olp, MD   Encounter Date: 05/12/2021   PT End of Session - 05/12/21 1232     Visit Number 3    Number of Visits 13    Date for PT Re-Evaluation 06/17/21    Authorization Type Medicare A+B/AARP    Progress Note Due on Visit 10    PT Start Time 1020    PT Stop Time 1100    PT Time Calculation (min) 40 min    Activity Tolerance Patient tolerated treatment well    Behavior During Therapy [facility] for tasks assessed/performed             Past Medical History:  Diagnosis Date   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 symptoms (LUTS)    CAD (coronary artery disease)    Cholelithiasis    Chronic allergic rhinitis    Coronary atherosclerosis of native coronary artery    Fatty liver    GERD (gastroesophageal reflux disease)    HX 16yrs ago, no longer a problem, no meds   High blood pressure    Hypercholesterolemia    Macrocytosis without anemia    OSA on CPAP    Paraesophageal hernia    Partial bowel obstruction ([place])    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 PT denies significant findings   CARDIAC CATHETERIZATION  2008   COLOSTOMY N/A 10/31/2019   Procedure: COLOSTOMY;  Surgeon: Erroll Luna, MD;  [facility];  Service: General;  Laterality: N/A;   CYSTOSCOPY W/ URETERAL STENT PLACEMENT Bilateral 10/31/2019   Procedure: CYSTOSCOPY URETERAL  STENT PLACEMENT BILATERAL;  Surgeon: Franchot Gallo, MD;  [facility];  Service: Urology;  Laterality: Bilateral;   CYSTOSCOPY WITH STENT PLACEMENT Bilateral 03/11/2020   Procedure: CYSTOSCOPY WITH BILATERAL FIREFLY INJECTION;  Surgeon: Irine Seal, MD;  [facility];  Service: Urology;  Laterality: Bilateral;   EYE SURGERY     BILATERAL CATARACT SURGERY WITH LENS IMPLANTS   FLEXIBLE SIGMOIDOSCOPY N/A 10/29/2019   Procedure: FLEXIBLE SIGMOIDOSCOPY;  Surgeon: Jerene Bears, MD;  [facility];  Service: Gastroenterology;  Laterality: N/A;   INCISIONAL HERNIA REPAIR N/A 10/13/2020   Procedure: OPEN INCISIONAL HERNIA REPAIR WITH MESH;  Surgeon: [name], MD;  Location: [facility];  Service: General;  Laterality: N/A;   INSERTION OF MESH N/A 09/08/2016   Procedure: INSERTION OF MESH;  Surgeon: Jackolyn Confer, MD;  [facility];  Service: General;  Laterality: N/A;   IR RADIOLOGIST EVAL & MGMT  11/10/2020   IR RADIOLOGIST EVAL & MGMT  11/25/2020   LAPAROSCOPIC SIGMOID COLECTOMY N/A 10/31/2019   Procedure: DIAGNOSTIC LAPAROSCOPY; EXPLORATORY LAPAROTOMY; SIGMOID COLECTOMY;  Surgeon: Erroll Luna, MD;  [facility];  Service: General;  Laterality: N/A;   right rotator cuff     SUBMUCOSAL TATTOO INJECTION  10/29/2019   Procedure: SUBMUCOSAL TATTOO INJECTION;  Surgeon: Jerene Bears, MD;  [facility];  Service:  Gastroenterology;;   TONSILLECTOMY     UMBILICAL HERNIA REPAIR N/A 09/08/2016   Procedure: OPEN UMBILICAL HERNIA REPAIR WITH MESH;  Surgeon: Jackolyn Confer, MD;  [facility];  Service: General;  Laterality: N/A;   VENTRAL HERNIA REPAIR N/A 02/03/2021   Procedure: LAPAROSCOPIC VENTRAL HERNIA REPAIR WITH MESH;  Surgeon: [name], MD;  Location: [facility];  Service: General;  Laterality: N/A;   XI ROBOTIC ASSISTED COLOSTOMY TAKEDOWN N/A 03/11/2020   Procedure: ROBOTIC ASSISTED COLOSTOMY REVERSAL, RIGID PROCTOSCOPY;  Surgeon: Leighton Ruff, MD;   [facility];  Service: General;  Laterality: N/A;    There were no vitals filed for this visit.   Subjective Assessment - 05/12/21 1216     Subjective Pt reports he has a couple of questions about his HEP - the direction of hip abduction and the marching with head turns; pt states he is doing exercises regularly    Patient is accompained by: Family member   Wife   Pertinent History Skin Cancer, CAD, GERD, Sleep Apnea, HTN, HLD, Vestibular Schwannoma    Limitations Standing;Walking;House hold activities    Diagnostic tests vestibular schwannoma in the right IAC    Patient Stated Goals getting in his boat; be able to walk dogs    Currently in Pain? No/denies                [facility] PT Assessment - 05/12/21 0001       Functional Gait  Assessment   Gait assessed  Yes    Gait Level Surface Walks 20 ft in less than 7 sec but greater than 5.5 sec, uses assistive device, slower speed, mild gait deviations, or deviates 6-10 in outside of the 12 in walkway width.   6.63, 6.62   Change in Gait Speed Able to change speed, demonstrates mild gait deviations, deviates 6-10 in outside of the 12 in walkway width, or no gait deviations, unable to achieve a major change in velocity, or uses a change in velocity, or uses an assistive device.    Gait with Horizontal Head Turns Performs head turns smoothly with slight change in gait velocity (eg, minor disruption to smooth gait path), deviates 6-10 in outside 12 in walkway width, or uses an assistive device.    Gait with Vertical Head Turns Performs head turns with no change in gait. Deviates no more than 6 in outside 12 in walkway width.    Gait and Pivot Turn Pivot turns safely within 3 sec and stops quickly with no loss of balance.    Step Over Obstacle Is able to step over 2 stacked shoe boxes taped together (9 in total height) without changing gait speed. No evidence of imbalance.    Gait with Narrow Base of Support Ambulates less than 4 steps heel  to toe or cannot perform without assistance.    Gait with Eyes Closed Walks 20 ft, uses assistive device, slower speed, mild gait deviations, deviates 6-10 in outside 12 in walkway width. Ambulates 20 ft in less than 9 sec but greater than 7 sec.    Ambulating Backwards Walks 20 ft, no assistive devices, good speed, no evidence for imbalance, normal gait    Steps Alternating feet, must use rail.    Total Score 22                           [facility] Adult PT Treatment/Exercise - 05/12/21 0001       Transfers  Transfers Sit to Stand;Stand to Sit    Sit to Stand 4: Min guard;5: Supervision    Stand to Sit 5: Supervision;4: Min guard    Comments 3 reps EO feet on floor; 5 reps EC feet on floor:  10 reps EO feet on Airex with CGA prn for balance recovery   18 reps total                Balance Exercises - 05/12/21 0001       Balance Exercises: Standing   Standing Eyes Opened Narrow base of support (BOS);Head turns;Solid surface;5 reps   horizontal and vertical   Standing Eyes Closed Wide (BOA);Narrow base of support (BOS);Head turns;Foam/compliant surface;Other reps (comment)   5 reps each horizontal and vertical with UE support due to unsteadiness   Rockerboard Anterior/posterior;EO;EC;Other reps (comment);UE support   20 reps EO; 20 reps EC with UE support   Partial Tandem Stance Eyes open;5 reps   horizontal and vertical head turns - UE support on counter   Marching Solid surface;Foam/compliant surface;Static;Head turns;10 reps   horizontal and vertical head turns - min assist for balance with marching on Airex with head turns   Heel Raises Both;10 reps    Other Standing Exercises Pt performed standing alternating forward kicks, side kicks, backward kicks                PT Education - 05/12/21 1230     Education Details added 3 exercises to HEP; 1) standing with feet together EO on floor with head turns; 2) EC - no head turns -hold 10-15 secs; 3) Standing  partial heel to toe - each position - hold 30 secs NO HEAD turns (on floor)    Person(s) Educated Patient;Spouse    Methods Explanation;Demonstration;Handout   added to previous medbridge HEP   Comprehension Verbalized understanding;Returned demonstration              PT Short Term Goals - 05/12/21 1244       PT SHORT TERM GOAL #1   Title Pt to be independent wtih initial HEP for strength/balance/vestibular (ALL STGs Due: 05/27/21)    Baseline no HEP established    Time 3    Period Weeks    Status New    Target Date 05/27/21      PT SHORT TERM GOAL #2   Title FGA TBA and LTG to be set as applicable    Baseline TBA    Time 3    Period Weeks    Status New      PT SHORT TERM GOAL #3   Title Patient will be able to hold situation 2 of M-CTSIB for full 30 seocnds to demo improved balance    Baseline 22 seconds    Time 3    Period Weeks    Status New               PT Long Term Goals - 05/12/21 1244       PT LONG TERM GOAL #1   Title Pt to be independent wtih final HEPand walking program (ALL LTGs Due: 06/17/21)    Baseline no HEP established    Time 6    Period Weeks    Status New      PT LONG TERM GOAL #2   Title LTG to be set for FGA    Baseline TBA    Time 6    Period Weeks    Status New  PT LONG TERM GOAL #3   Title Patient will be able to hold situation 4 of M-CTSIB for >/= 15 seconds to demo improved vestibular input for balance    Baseline unable to complete    Time 6    Period Weeks    Status New      PT LONG TERM GOAL #4   Title LTG to be set for MSQ if applicable    Baseline TBA    Time 6    Period Weeks    Status New      PT LONG TERM GOAL #5   Title Patient will be able to ambulate >/= 1000 ft outdoors unlevel surfaces without AD and Mod I for improved ability for community mobility    Baseline using SPC outdoors at times    Time 6    Period Weeks    Status New      PT LONG TERM GOAL #6   Title Patient will improve DFS >/= 59     Baseline 49    Time 6    Period Weeks    Status New                   Plan - 05/12/21 1233     Clinical Impression Statement Pt's FGA score = 22/30; gait is impacted by bil. plantarflexor weakness with pt unable to achieve push off in stance phase of gait.  Pt does demonstrate less wide BOS with more narrow base noted during session.  Pt's balance is improving - pt did require UE support on counter for first 3-4 reps with the alternating kicks due to SLS deficits but was able to perform without UE support for final 6 reps.  Pt has most difficulty maintaining balance on compliant surface with EC, indicative of decreased vestibular input in maintaining balance.    Personal Factors and Comorbidities Comorbidity 3+;Age    Comorbidities Skin Cancer, CAD, GERD, Sleep Apnea, HTN, HLD, Vestibular Schwannoma    Examination-Activity Limitations Bed Mobility;Bend;Caring for Others;Stairs    Examination-Participation Restrictions [employer] Activity    Stability/Clinical Decision Making Stable/Uncomplicated    Rehab Potential Good    PT Frequency 2x / week    PT Duration 6 weeks    PT Treatment/Interventions ADLs/Self Care Home Management;Aquatic Therapy;Canalith Repostioning;Cryotherapy;Moist Heat;DME Instruction;Electrical Stimulation;Gait training;Stair training;Functional mobility training;Therapeutic activities;Therapeutic exercise;Balance training;Neuromuscular re-education;Patient/family education;Vestibular;Dry needling;Manual techniques;Passive range of motion;Joint Manipulations    PT Next Visit Plan [place] - I forgot to add x1 viewing to HEP - please add if you want him to do this ex. - I focused more on balance; add compliant surface training    PT Home Exercise Plan Medbridge [id number]; 3 exercises added to this HEP on 05-12-21    Consulted and Agree with Plan of Care Patient             Patient will benefit from skilled therapeutic intervention in order to improve  the following deficits and impairments:  Difficulty walking, Abnormal gait, Decreased balance, Decreased endurance, Decreased strength, Decreased knowledge of use of DME, Decreased activity tolerance, Dizziness, Decreased range of motion  Visit Diagnosis: Unsteadiness on feet  Other abnormalities of gait and mobility     Problem List Patient Active Problem List   Diagnosis Date Noted   Sleep apnea 05/07/2021   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   B12 deficiency 01/06/2021   Vitamin D deficiency 01/06/2021   Aortic atherosclerosis ([place]) 01/06/2021   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 [place], [place], [address] Phone: [phone]   Fax:  [phone]  Name: Wesley [name] MRN: [id number] Date of Birth: 04-28-1941

## 2021-05-18 ENCOUNTER — Other Ambulatory Visit: Payer: Self-pay

## 2021-05-18 ENCOUNTER — Encounter: Payer: Self-pay | Admitting: Physical Therapy

## 2021-05-18 ENCOUNTER — Ambulatory Visit: Payer: Medicare Other | Attending: Family Medicine | Admitting: Physical Therapy

## 2021-05-18 DIAGNOSIS — I69351 Hemiplegia and hemiparesis following cerebral infarction affecting right dominant side: Secondary | ICD-10-CM | POA: Diagnosis not present

## 2021-05-18 DIAGNOSIS — R42 Dizziness and giddiness: Secondary | ICD-10-CM | POA: Diagnosis not present

## 2021-05-18 DIAGNOSIS — M6281 Muscle weakness (generalized): Secondary | ICD-10-CM | POA: Diagnosis not present

## 2021-05-18 DIAGNOSIS — R2689 Other abnormalities of gait and mobility: Secondary | ICD-10-CM | POA: Diagnosis not present

## 2021-05-18 DIAGNOSIS — R2681 Unsteadiness on feet: Secondary | ICD-10-CM | POA: Insufficient documentation

## 2021-05-18 NOTE — Therapy (Signed)
[place] [facility] [address] [place], [place], [address] Phone: [phone]   Fax:  [phone]  Physical Therapy Treatment  Patient Details  Name: Wesley [name] MRN: [id number] Date of Birth: Apr 11, 1941 Referring Provider (PT): Marin Olp, MD   Encounter Date: 05/18/2021   PT End of Session - 05/18/21 1321     Visit Number 4    Number of Visits 13    Date for PT Re-Evaluation 06/17/21    Authorization Type Medicare A+B/[employer]ARP    Progress Note Due on Visit 10    PT Start Time 1319    PT Stop Time 1400    PT Time Calculation (min) 41 min    Activity Tolerance Patient tolerated treatment well    Behavior During Therapy [facility] for tasks assessed/performed             Past Medical History:  Diagnosis Date   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 symptoms (LUTS)    CAD (coronary artery disease)    Cholelithiasis    Chronic allergic rhinitis    Coronary atherosclerosis of native coronary artery    Fatty liver    GERD (gastroesophageal reflux disease)    HX 32yrs ago, no longer a problem, no meds   High blood pressure    Hypercholesterolemia    Macrocytosis without anemia    OSA on CPAP    Paraesophageal hernia    Partial bowel obstruction ([place])    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 PT denies significant findings   CARDIAC CATHETERIZATION  2008   COLOSTOMY N/A 10/31/2019   Procedure: COLOSTOMY;  Surgeon: Erroll Luna, MD;  [facility];  Service: General;  Laterality: N/A;   CYSTOSCOPY W/ URETERAL STENT PLACEMENT Bilateral 10/31/2019   Procedure: CYSTOSCOPY URETERAL  STENT PLACEMENT BILATERAL;  Surgeon: Franchot Gallo, MD;  [facility];  Service: Urology;  Laterality: Bilateral;   CYSTOSCOPY WITH STENT PLACEMENT Bilateral 03/11/2020   Procedure: CYSTOSCOPY WITH BILATERAL FIREFLY INJECTION;  Surgeon: Irine Seal, MD;  [facility];  Service: Urology;  Laterality: Bilateral;   EYE SURGERY     BILATERAL CATARACT SURGERY WITH LENS IMPLANTS   FLEXIBLE SIGMOIDOSCOPY N/A 10/29/2019   Procedure: FLEXIBLE SIGMOIDOSCOPY;  Surgeon: Jerene Bears, MD;  [facility];  Service: Gastroenterology;  Laterality: N/A;   INCISIONAL HERNIA REPAIR N/A 10/13/2020   Procedure: OPEN INCISIONAL HERNIA REPAIR WITH MESH;  Surgeon: [name], MD;  Location: [place];  Service: General;  Laterality: N/A;   INSERTION OF MESH N/A 09/08/2016   Procedure: INSERTION OF MESH;  Surgeon: Jackolyn Confer, MD;  [facility];  Service: General;  Laterality: N/A;   IR RADIOLOGIST EVAL & MGMT  11/10/2020   IR RADIOLOGIST EVAL & MGMT  11/25/2020   LAPAROSCOPIC SIGMOID COLECTOMY N/A 10/31/2019   Procedure: DIAGNOSTIC LAPAROSCOPY; EXPLORATORY LAPAROTOMY; SIGMOID COLECTOMY;  Surgeon: Erroll Luna, MD;  [facility];  Service: General;  Laterality: N/A;   right rotator cuff     SUBMUCOSAL TATTOO INJECTION  10/29/2019   Procedure: SUBMUCOSAL TATTOO INJECTION;  Surgeon: Jerene Bears, MD;  [facility];  Service:  Gastroenterology;;   TONSILLECTOMY     UMBILICAL HERNIA REPAIR N/A 09/08/2016   Procedure: OPEN UMBILICAL HERNIA REPAIR WITH MESH;  Surgeon: Jackolyn Confer, MD;  [facility];  Service: General;  Laterality: N/A;   VENTRAL HERNIA REPAIR N/A 02/03/2021   Procedure: LAPAROSCOPIC VENTRAL HERNIA REPAIR WITH MESH;  Surgeon: [name], MD;  Location: [place];  Service: General;  Laterality: N/A;   XI ROBOTIC ASSISTED COLOSTOMY TAKEDOWN N/A 03/11/2020   Procedure: ROBOTIC ASSISTED COLOSTOMY REVERSAL, RIGID PROCTOSCOPY;  Surgeon: Leighton Ruff, MD;   [facility];  Service: General;  Laterality: N/A;    There were no vitals filed for this visit.   Subjective Assessment - 05/18/21 1320     Subjective No new complaints. No falls or pain. Did have a hard time sleeping last night after watching the [place] game where they won.    Pertinent History Skin Cancer, CAD, GERD, Sleep Apnea, HTN, HLD, Vestibular Schwannoma    Limitations Standing;Walking;House hold activities    Diagnostic tests vestibular schwannoma in the right IAC    Currently in Pain? No/denies    Pain Score 0-No pain                    [facility] Adult PT Treatment/Exercise - 05/18/21 1323       Transfers   Transfers Sit to Stand;Stand to Sit    Sit to Stand 5: Supervision;With upper extremity assist;Without upper extremity assist;From bed;From chair/3-in-1    Stand to Sit 5: Supervision;With upper extremity assist;Without upper extremity assist;To bed;To chair/3-in-1      Ambulation/Gait   Ambulation/Gait Yes    Ambulation/Gait Assistance 5: Supervision    Assistive device None    Gait Pattern Step-through pattern;Decreased step length - right;Decreased step length - left;Wide base of support    Ambulation Surface Level;Unlevel      Exercises   Exercises Other Exercises    Other Exercises  seated at edge of mat: shown hamstring, then heel cord stretches as pt reprots heel/toe raises are hard without leaning body                 Balance Exercises - 05/18/21 1334       Balance Exercises: Standing   Rockerboard Anterior/posterior;Lateral;EO;EC;20 seconds;Other reps (comment);Intermittent UE support;Limitations    Rockerboard Limitations performed both ways on balance board: rocking the board with EO progressing to [facility] with emphasis on tall posture. Then holding the board steady with EC 20 sec's x 3 reps. Cues on posture and weight shifting for balance assistance. Min to mod assist needed for balance with increased assist with vision removed.            Issued to HEP this session: Access Code: [email or web address] URL: [email or web address] Date: 05/18/2021 Prepared by: Willow Ora  Exercises Standing March with Counter Support - 1 x daily - 7 x weekly - 3 sets - 10 reps Sit to Stand Without Arm Support - 1 x daily - 7 x weekly - 1 sets - 10 reps Heel Raise - 1 x daily - 7 x weekly - 1 sets - 10 reps Standing Hip Abduction with Counter Support - 1 x daily - 7 x weekly - 3 sets - 10 reps Romberg Stance with Head Nods - 1 x daily - 7 x weekly - 3 sets - 10 reps Standing Romberg to 1/4 Tandem Stance - 1 x daily - 7 x weekly - 3 sets - 10 reps  Added to HEP this session:  Standing Gaze Stabilization with Head Rotation - 1 x daily - 5 x weekly - 1 sets - 10 reps Standing Gaze Stabilization with Head Nod - 1 x daily - 5 x weekly - 1 sets - 10 reps    PT Education - 05/18/21 1333     Education Details added x1 ex''s in standing    Person(s) Educated Patient    Methods Explanation;Demonstration;Verbal cues;Handout    Comprehension Verbalized understanding;Returned demonstration;Verbal cues required;Need further instruction              PT Short Term Goals - 05/12/21 1244       PT SHORT TERM GOAL #1   Title Pt to be independent wtih initial HEP for strength/balance/vestibular (ALL STGs Due: 05/27/21)    Baseline no HEP established    Time 3    Period Weeks    Status New    Target Date 05/27/21      PT SHORT TERM GOAL #2   Title FGA TBA and LTG to be set as applicable    Baseline TBA    Time 3    Period Weeks    Status New      PT SHORT TERM GOAL #3   Title Patient will be able to hold situation 2 of M-CTSIB for full 30 seocnds to demo improved balance    Baseline 22 seconds    Time 3    Period Weeks    Status New               PT Long Term Goals - 05/12/21 1244       PT LONG TERM GOAL #1   Title Pt to be independent wtih final HEPand walking program (ALL LTGs Due: 06/17/21)    Baseline no HEP  established    Time 6    Period Weeks    Status New      PT LONG TERM GOAL #2   Title LTG to be set for FGA    Baseline TBA    Time 6    Period Weeks    Status New      PT LONG TERM GOAL #3   Title Patient will be able to hold situation 4 of M-CTSIB for >/= 15 seconds to demo improved vestibular input for balance    Baseline unable to complete    Time 6    Period Weeks    Status New      PT LONG TERM GOAL #4   Title LTG to be set for MSQ if applicable    Baseline TBA    Time 6    Period Weeks    Status New      PT LONG TERM GOAL #5   Title Patient will be able to ambulate >/= 1000 ft outdoors unlevel surfaces without AD and Mod I for improved ability for community mobility    Baseline using SPC outdoors at times    Time 6    Period Weeks    Status New      PT LONG TERM GOAL #6   Title Patient will improve DFS >/= 59    Baseline 49    Time 6    Period Weeks    Status New               05/18/21 1321  Plan  Clinical Impression Statement Today's skilled session initially focused on performance of X1 ex's and adding them to HEP. No issues noted or reported with  performance in session. Also discussed LE stretches pt can perform at home due to reported LE tightness with heel<>toe raises. Remainder of session focused on balance training with up to mod assist needed with vision removed. No issues reported or noted in session. The pt is making steady progress and should benefit from continued PT to progress toward unmet goals.  Personal Factors and Comorbidities Comorbidity 3+;Age  Comorbidities Skin Cancer, CAD, GERD, Sleep Apnea, HTN, HLD, Vestibular Schwannoma  Examination-Activity Limitations Bed Mobility;Bend;Caring for Others;Stairs  Examination-Participation Restrictions Yard Work;Community Activity  Pt will benefit from skilled therapeutic intervention in order to improve on the following deficits Difficulty walking;Abnormal gait;Decreased balance;Decreased  endurance;Decreased strength;Decreased knowledge of use of DME;Decreased activity tolerance;Dizziness;Decreased range of motion  Stability/Clinical Decision Making Stable/Uncomplicated  Rehab Potential Good  PT Frequency 2x / week  PT Duration 6 weeks  PT Treatment/Interventions ADLs/Self Care Home Management;Aquatic Therapy;Canalith Repostioning;Cryotherapy;Moist Heat;DME Instruction;Electrical Stimulation;Gait training;Stair training;Functional mobility training;Therapeutic activities;Therapeutic exercise;Balance training;Neuromuscular re-education;Patient/family education;Vestibular;Dry needling;Manual techniques;Passive range of motion;Joint Manipulations  PT Next Visit Plan perform FGA/DGI to set baseline for goals; continued to focus on balance on compliant surfaces  PT [place] and Agree with Plan of Care Patient          Patient will benefit from skilled therapeutic intervention in order to improve the following deficits and impairments:  Difficulty walking, Abnormal gait, Decreased balance, Decreased endurance, Decreased strength, Decreased knowledge of use of DME, Decreased activity tolerance, Dizziness, Decreased range of motion  Visit Diagnosis: Unsteadiness on feet  Other abnormalities of gait and mobility  Muscle weakness (generalized)  Dizziness and giddiness  Hemiplegia and hemiparesis following cerebral infarction affecting right dominant side [facility])     Problem List Patient Active Problem List   Diagnosis Date Noted   Sleep apnea 05/07/2021   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   B12 deficiency 01/06/2021   Vitamin D deficiency 01/06/2021   Aortic atherosclerosis ([place]) 01/06/2021   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[place]) 01/06/2021   Allergic rhinitis 01/06/2021   BPH associated with nocturia 01/06/2021   Abdominal wall abscess 10/28/2020   S/P hernia repair 10/13/2020   Osteoarthritis of glenohumeral joint,  left 09/21/2017   Non-traumatic rotator cuff tear 09/21/2017   CAD (coronary artery disease) 08/12/2013   Essential hypertension 03/15/2011   GERD (gastroesophageal reflux disease) 03/15/2011    Willow Ora, PTA, [facility] [address], [place], [place] [address] [phone] 05/19/21, 10:16 PM   Name: JAICE DIGIOIA MRN: [id number] Date of Birth: 1941-05-03

## 2021-05-18 NOTE — Patient Instructions (Signed)
Access Code: [email or web address] URL: [email or web address] Date: 05/18/2021 Prepared by: Willow Ora  Exercises Standing March with Counter Support - 1 x daily - 7 x weekly - 3 sets - 10 reps Sit to Stand Without Arm Support - 1 x daily - 7 x weekly - 1 sets - 10 reps Heel Raise - 1 x daily - 7 x weekly - 1 sets - 10 reps Standing Hip Abduction with Counter Support - 1 x daily - 7 x weekly - 3 sets - 10 reps Romberg Stance with Head Nods - 1 x daily - 7 x weekly - 3 sets - 10 reps Standing Romberg to 1/4 Tandem Stance - 1 x daily - 7 x weekly - 3 sets - 10 reps  Added to HEP this session:  Standing Gaze Stabilization with Head Rotation - 1 x daily - 5 x weekly - 1 sets - 10 reps Standing Gaze Stabilization with Head Nod - 1 x daily - 5 x weekly - 1 sets - 10 reps

## 2021-05-20 ENCOUNTER — Other Ambulatory Visit: Payer: Self-pay

## 2021-05-20 ENCOUNTER — Encounter: Payer: Self-pay | Admitting: Physical Therapy

## 2021-05-20 ENCOUNTER — Ambulatory Visit: Payer: Medicare Other | Admitting: Physical Therapy

## 2021-05-20 DIAGNOSIS — R2689 Other abnormalities of gait and mobility: Secondary | ICD-10-CM | POA: Diagnosis not present

## 2021-05-20 DIAGNOSIS — M6281 Muscle weakness (generalized): Secondary | ICD-10-CM

## 2021-05-20 DIAGNOSIS — I69351 Hemiplegia and hemiparesis following cerebral infarction affecting right dominant side: Secondary | ICD-10-CM | POA: Diagnosis not present

## 2021-05-20 DIAGNOSIS — R42 Dizziness and giddiness: Secondary | ICD-10-CM | POA: Diagnosis not present

## 2021-05-20 DIAGNOSIS — R2681 Unsteadiness on feet: Secondary | ICD-10-CM | POA: Diagnosis not present

## 2021-05-23 DIAGNOSIS — Z23 Encounter for immunization: Secondary | ICD-10-CM | POA: Diagnosis not present

## 2021-05-23 NOTE — Therapy (Signed)
[facility] [place] [address] [place], [place], [address] Phone: [phone]   Fax:  [phone]  Physical Therapy Treatment  Patient Details  Name: Wesley [name] MRN: [id number] Date of Birth: [birth year] Referring Provider (PT): Marin Olp, MD   Encounter Date: 05/20/2021    05/20/21 1241  PT Visits / Re-Eval  Visit Number 5  Number of Visits 13  Date for PT Re-Evaluation 06/17/21  Authorization  Authorization Type Medicare A+B/AARP  Progress Note Due on Visit 10  PT Time Calculation  PT Start Time 1237 (pt running late due to traffic)  PT Stop Time 1315  PT Time Calculation (min) 38 min  PT - End of Session  Equipment Utilized During Treatment Gait belt  Activity Tolerance Patient tolerated treatment well  Behavior During Therapy WFL for tasks assessed/performed    Past Medical History:  Diagnosis Date   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 symptoms (LUTS)    CAD (coronary artery disease)    Cholelithiasis    Chronic allergic rhinitis    Coronary atherosclerosis of native coronary artery    Fatty liver    GERD (gastroesophageal reflux disease)    HX 23yrs ago, no longer a problem, no meds   High blood pressure    Hypercholesterolemia    Macrocytosis without anemia    OSA on CPAP    Paraesophageal hernia    Partial bowel obstruction ([place])    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 PT denies significant findings   CARDIAC CATHETERIZATION  2008   COLOSTOMY N/A 10/31/2019   Procedure: COLOSTOMY;  Surgeon: Erroll Luna, MD;  [facility];  Service: General;   Laterality: N/A;   CYSTOSCOPY W/ URETERAL STENT PLACEMENT Bilateral 10/31/2019   Procedure: CYSTOSCOPY URETERAL STENT PLACEMENT BILATERAL;  Surgeon: Franchot Gallo, MD;  [facility];  Service: Urology;  Laterality: Bilateral;   CYSTOSCOPY WITH STENT PLACEMENT Bilateral 03/11/2020   Procedure: CYSTOSCOPY WITH BILATERAL FIREFLY INJECTION;  Surgeon: Irine Seal, MD;  [facility];  Service: Urology;  Laterality: Bilateral;   EYE SURGERY     BILATERAL CATARACT SURGERY WITH LENS IMPLANTS   FLEXIBLE SIGMOIDOSCOPY N/A 10/29/2019   Procedure: FLEXIBLE SIGMOIDOSCOPY;  Surgeon: Jerene Bears, MD;  [facility];  Service: Gastroenterology;  Laterality: N/A;   INCISIONAL HERNIA REPAIR N/A 10/13/2020   Procedure: OPEN INCISIONAL HERNIA REPAIR WITH MESH;  Surgeon: [name], MD;  Location: [place];  Service: General;  Laterality: N/A;   INSERTION OF MESH N/A 09/08/2016   Procedure: INSERTION OF MESH;  Surgeon: Jackolyn Confer, MD;  [facility];  Service: General;  Laterality: N/A;   IR RADIOLOGIST EVAL & MGMT  11/10/2020   IR RADIOLOGIST EVAL & MGMT  11/25/2020   LAPAROSCOPIC SIGMOID COLECTOMY N/A 10/31/2019   Procedure: DIAGNOSTIC LAPAROSCOPY; EXPLORATORY LAPAROTOMY; SIGMOID COLECTOMY;  Surgeon: Erroll Luna, MD;  [facility];  Service: General;  Laterality: N/A;   right rotator cuff     SUBMUCOSAL TATTOO INJECTION  10/29/2019   Procedure: SUBMUCOSAL TATTOO INJECTION;  Surgeon: Jerene Bears, MD;  [facility];  Service: Gastroenterology;;   TONSILLECTOMY  UMBILICAL HERNIA REPAIR N/A 09/08/2016   Procedure: OPEN UMBILICAL HERNIA REPAIR WITH MESH;  Surgeon: Jackolyn Confer, MD;  [facility];  Service: General;  Laterality: N/A;   VENTRAL HERNIA REPAIR N/A 02/03/2021   Procedure: LAPAROSCOPIC VENTRAL HERNIA REPAIR WITH MESH;  Surgeon: [name], MD;  Location: [place];  Service: General;  Laterality: N/A;   XI ROBOTIC ASSISTED COLOSTOMY TAKEDOWN N/A  03/11/2020   Procedure: ROBOTIC ASSISTED COLOSTOMY REVERSAL, RIGID PROCTOSCOPY;  Surgeon: Leighton Ruff, MD;  [facility];  Service: General;  Laterality: N/A;    There were no vitals filed for this visit.    05/20/21 1239  Symptoms/Limitations  Subjective No new complaitns. No falls or pain to report. Does report improvement in the mornings when he first gets up, has not needed to use the walker. Still has it next to the bed to use if and as needed. Also feels his walking balance is better.  Pertinent History Skin Cancer, CAD, GERD, Sleep Apnea, HTN, HLD, Vestibular Schwannoma  Limitations Standing;Walking;House hold activities  Diagnostic tests vestibular schwannoma in the right IAC  Patient Stated Goals getting in his boat; be able to walk dogs  Pain Assessment  Currently in Pain? No/denies  Pain Score 0      05/20/21 1242  Transfers  Transfers Sit to Stand;Stand to Sit  Sit to Stand 5: Supervision;With upper extremity assist;Without upper extremity assist;From bed;From chair/3-in-1  Stand to Sit 5: Supervision;With upper extremity assist;Without upper extremity assist;To bed;To chair/3-in-1  Ambulation/Gait  Ambulation/Gait Yes  Ambulation/Gait Assistance 5: Supervision  Ambulation Distance (Feet)  (around clinic with session)  Assistive device None  Gait Pattern Step-through pattern;Decreased step length - right;Decreased step length - left;Wide base of support  Ambulation Surface Level;Indoor  High Level Balance  High Level Balance Activities Side stepping;Marching forwards;Marching backwards;Tandem walking (tandem forward/backward, toe walking forward/backward with minimal lift noted)  High Level Balance Comments on red/blue mats next to counter top: 3 laps each/each way with min guard to min assist, occasional UE support on counter top, cues on form, posture and technique.  Neuro Re-ed   Neuro Re-ed Details  for balance/muscle re-ed/coordination: gait around track  working on speed changes, scanning all directions randomly and sudden stops/starts with min guard assist, one episode of min assist due to foot scuffing with pt assisting with balance correction;       05/20/21 1310  Balance Exercises: Standing  Standing Eyes Closed Narrow base of support (BOS);Wide (BOA);Head turns;Foam/compliant surface;Other reps (comment);30 secs;Limitations  Standing Eyes Closed Limitations on airex with light touch on sturdy surface at times: feet hip width apart- EC 30 sec's x 3 reps, feet wider apart for EC head movements left<>right, up<>down x ~10 reps each with min guard to min assist for balance.          PT Short Term Goals - 05/12/21 1244       PT SHORT TERM GOAL #1   Title Pt to be independent wtih initial HEP for strength/balance/vestibular (ALL STGs Due: 05/27/21)    Baseline no HEP established    Time 3    Period Weeks    Status New    Target Date 05/27/21      PT SHORT TERM GOAL #2   Title FGA TBA and LTG to be set as applicable    Baseline TBA    Time 3    Period Weeks    Status New      PT SHORT TERM GOAL #3  Title Patient will be able to hold situation 2 of M-CTSIB for full 30 seocnds to demo improved balance    Baseline 22 seconds    Time 3    Period Weeks    Status New               PT Long Term Goals - 05/12/21 1244       PT LONG TERM GOAL #1   Title Pt to be independent wtih final HEPand walking program (ALL LTGs Due: 06/17/21)    Baseline no HEP established    Time 6    Period Weeks    Status New      PT LONG TERM GOAL #2   Title LTG to be set for FGA    Baseline TBA    Time 6    Period Weeks    Status New      PT LONG TERM GOAL #3   Title Patient will be able to hold situation 4 of M-CTSIB for >/= 15 seconds to demo improved vestibular input for balance    Baseline unable to complete    Time 6    Period Weeks    Status New      PT LONG TERM GOAL #4   Title LTG to be set for MSQ if applicable     Baseline TBA    Time 6    Period Weeks    Status New      PT LONG TERM GOAL #5   Title Patient will be able to ambulate >/= 1000 ft outdoors unlevel surfaces without AD and Mod I for improved ability for community mobility    Baseline using SPC outdoors at times    Time 6    Period Weeks    Status New      PT LONG TERM GOAL #6   Title Patient will improve DFS >/= 59    Baseline 49    Time 6    Period Weeks    Status New               05/20/21 1241  Plan  Clinical Impression Statement Today's skilled session continued to focus on balance training on complaint surfaces with rest breaks takne as needed due to fatigue. No other issues noted or reported in session. The pt is making steady progress toward goals and should benefit from continued PT to progress toward unmet goals.  Personal Factors and Comorbidities Comorbidity 3+;Age  Comorbidities Skin Cancer, CAD, GERD, Sleep Apnea, HTN, HLD, Vestibular Schwannoma  Examination-Activity Limitations Bed Mobility;Bend;Caring for Others;Stairs  Examination-Participation Restrictions Yard Work;Community Activity  Pt will benefit from skilled therapeutic intervention in order to improve on the following deficits Difficulty walking;Abnormal gait;Decreased balance;Decreased endurance;Decreased strength;Decreased knowledge of use of DME;Decreased activity tolerance;Dizziness;Decreased range of motion  Stability/Clinical Decision Making Stable/Uncomplicated  Rehab Potential Good  PT Frequency 2x / week  PT Duration 6 weeks  PT Treatment/Interventions ADLs/Self Care Home Management;Aquatic Therapy;Canalith Repostioning;Cryotherapy;Moist Heat;DME Instruction;Electrical Stimulation;Gait training;Stair training;Functional mobility training;Therapeutic activities;Therapeutic exercise;Balance training;Neuromuscular re-education;Patient/family education;Vestibular;Dry needling;Manual techniques;Passive range of motion;Joint Manipulations  PT Next  Visit Plan FGA was performed on 05/12/21 with score of 22/30,  goals need to be updated by primary PT.continued to focus on balance on compliant surfaces  PT Home Exercise Plan [place] and Agree with Plan of Care Patient         Patient will benefit from skilled therapeutic intervention in order to improve the following deficits and impairments:  Difficulty  walking, Abnormal gait, Decreased balance, Decreased endurance, Decreased strength, Decreased knowledge of use of DME, Decreased activity tolerance, Dizziness, Decreased range of motion  Visit Diagnosis: Unsteadiness on feet  Other abnormalities of gait and mobility  Muscle weakness (generalized)  Dizziness and giddiness     Problem List Patient Active Problem List   Diagnosis Date Noted   Sleep apnea 05/07/2021   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   B12 deficiency 01/06/2021   Vitamin D deficiency 01/06/2021   Aortic atherosclerosis ([place]) 01/06/2021   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[place]) 01/06/2021   Allergic rhinitis 01/06/2021   BPH associated with nocturia 01/06/2021   Abdominal wall abscess 10/28/2020   S/P hernia repair 10/13/2020   Osteoarthritis of glenohumeral joint, left 09/21/2017   Non-traumatic rotator cuff tear 09/21/2017   CAD (coronary artery disease) 08/12/2013   Essential hypertension 03/15/2011   GERD (gastroesophageal reflux disease) 03/15/2011    Willow Ora, PTA, [facility] [address] [place] [employer], [place] [address] [phone] 05/23/21, 12:17 AM   Name: Wesley [name] MRN: [id number] Date of Birth: 1940/08/28

## 2021-05-25 ENCOUNTER — Ambulatory Visit: Payer: Medicare Other

## 2021-05-27 ENCOUNTER — Ambulatory Visit: Payer: Medicare Other

## 2021-06-01 ENCOUNTER — Ambulatory Visit: Payer: Medicare Other

## 2021-06-03 ENCOUNTER — Ambulatory Visit: Payer: Medicare Other

## 2021-06-06 ENCOUNTER — Ambulatory Visit: Payer: Medicare Other

## 2021-06-08 ENCOUNTER — Ambulatory Visit: Payer: Medicare Other

## 2021-06-13 ENCOUNTER — Ambulatory Visit: Payer: Medicare Other

## 2021-06-15 ENCOUNTER — Ambulatory Visit: Payer: Medicare Other

## 2021-06-16 ENCOUNTER — Other Ambulatory Visit: Payer: Self-pay

## 2021-06-16 ENCOUNTER — Ambulatory Visit: Payer: Medicare Other | Attending: Family Medicine

## 2021-06-16 DIAGNOSIS — R2689 Other abnormalities of gait and mobility: Secondary | ICD-10-CM | POA: Diagnosis not present

## 2021-06-16 DIAGNOSIS — R42 Dizziness and giddiness: Secondary | ICD-10-CM | POA: Diagnosis not present

## 2021-06-16 DIAGNOSIS — M6281 Muscle weakness (generalized): Secondary | ICD-10-CM

## 2021-06-16 DIAGNOSIS — R2681 Unsteadiness on feet: Secondary | ICD-10-CM | POA: Diagnosis not present

## 2021-06-16 NOTE — Patient Instructions (Signed)
Access Code: [email or web address] URL: [email or web address] Date: 06/16/2021 Prepared by: Baldomero Lamy  Exercises Standing March with Counter Support - 1 x daily - 7 x weekly - 3 sets - 10 reps Sit to Stand Without Arm Support - 1 x daily - 7 x weekly - 1 sets - 10 reps Heel Raise - 1 x daily - 7 x weekly - 1 sets - 10 reps Standing Hip Abduction with Counter Support - 1 x daily - 7 x weekly - 3 sets - 10 reps Romberg Stance with Head Nods - 1 x daily - 7 x weekly - 3 sets - 10 reps Standing Romberg to 1/4 Tandem Stance - 1 x daily - 7 x weekly - 3 sets - 10 reps Standing Gaze Stabilization with Head Rotation - 1 x daily - 5 x weekly - 1 sets - 10 reps Standing Gaze Stabilization with Head Nod - 1 x daily - 5 x weekly - 1 sets - 10 reps Romberg Stance with Eyes Closed - 1 x daily - 5 x weekly - 1 sets - 3 reps - 30 seconds hold Standing Balance with Eyes Closed on Foam - 1 x daily - 5 x weekly - 1 sets - 3 reps - 30 seconds hold   Also provided Walking Program - starting at 10 mins, 3x/daily.

## 2021-06-16 NOTE — Therapy (Signed)
[facility] [address] [place], [place], [address] Phone: [phone]   Fax:  [phone]  Physical Therapy Treatment/Re-Certification  Patient Details  Name: Wesley [name] MRN: [id number] Date of Birth: [birth year] Referring Provider (PT): Marin Olp, MD   Encounter Date: 06/16/2021   PT End of Session - 06/16/21 1058     Visit Number 6    Number of Visits 14    Date for PT Re-Evaluation 07/22/21    Authorization Type Medicare A+B/[employer]    Progress Note Due on Visit 10    PT Start Time 1100    PT Stop Time 1140    PT Time Calculation (min) 40 min    Equipment Utilized During Treatment --    Activity Tolerance Patient tolerated treatment well    Behavior During Therapy WFL for tasks assessed/performed             Past Medical History:  Diagnosis Date   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 symptoms (LUTS)    CAD (coronary artery disease)    Cholelithiasis    Chronic allergic rhinitis    Coronary atherosclerosis of native coronary artery    Fatty liver    GERD (gastroesophageal reflux disease)    HX 89yr ago, no longer a problem, no meds   High blood pressure    Hypercholesterolemia    Macrocytosis without anemia    OSA on CPAP    Paraesophageal hernia    [place]    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 PT denies significant findings   CARDIAC CATHETERIZATION  2008   COLOSTOMY N/A 10/31/2019   Procedure: COLOSTOMY;  Surgeon: CErroll Luna MD;  Location: [facility];  Service: General;  Laterality: N/A;   CYSTOSCOPY W/ URETERAL STENT  PLACEMENT Bilateral 10/31/2019   Procedure: CYSTOSCOPY URETERAL STENT PLACEMENT BILATERAL;  Surgeon: DFranchot Gallo MD;  Location: [facility];  Service: Urology;  Laterality: Bilateral;   CYSTOSCOPY WITH STENT PLACEMENT Bilateral 03/11/2020   Procedure: CYSTOSCOPY WITH BILATERAL FIREFLY INJECTION;  Surgeon: WIrine Seal MD;  Location: [facility];  Service: Urology;  Laterality: Bilateral;   EYE SURGERY     BILATERAL CATARACT SURGERY WITH LENS IMPLANTS   FLEXIBLE SIGMOIDOSCOPY N/A 10/29/2019   Procedure: FLEXIBLE SIGMOIDOSCOPY;  Surgeon: PJerene Bears MD;  [facility]: [facility];  Service: Gastroenterology;  Laterality: N/A;   INCISIONAL HERNIA REPAIR N/A 10/13/2020   Procedure: OPEN INCISIONAL HERNIA REPAIR WITH MESH;  Surgeon: RRalene Ok MD;  [facility]  Service: General;  Laterality: N/A;   INSERTION OF MESH N/A 09/08/2016   Procedure: INSERTION OF MESH;  Surgeon: TJackolyn Confer MD;  [facility]: [facility];  Service: General;  Laterality: N/A;   IR RADIOLOGIST EVAL & MGMT  11/10/2020   IR RADIOLOGIST EVAL & MGMT  11/25/2020   LAPAROSCOPIC SIGMOID COLECTOMY N/A 10/31/2019   Procedure: DIAGNOSTIC LAPAROSCOPY; EXPLORATORY LAPAROTOMY; SIGMOID COLECTOMY;  Surgeon: CErroll Luna MD;  Location: [facility];  Service: General;  Laterality: N/A;   right rotator cuff     SUBMUCOSAL TATTOO INJECTION  10/29/2019   Procedure: SUBMUCOSAL TATTOO INJECTION;  Surgeon: PZenovia Jarred  Jerilynn Mages, MD;  Location: [facility];  Service: Gastroenterology;;   TONSILLECTOMY     UMBILICAL HERNIA REPAIR N/A 09/08/2016   Procedure: OPEN UMBILICAL HERNIA REPAIR WITH MESH;  Surgeon: Jackolyn Confer, MD;  [facility];  Service: General;  Laterality: N/A;   VENTRAL HERNIA REPAIR N/A 02/03/2021   Procedure: LAPAROSCOPIC VENTRAL HERNIA REPAIR WITH MESH;  Surgeon: [name], MD;  Location: [facility];  Service: General;  Laterality: N/A;   XI ROBOTIC ASSISTED COLOSTOMY TAKEDOWN N/A 03/11/2020   Procedure: ROBOTIC ASSISTED COLOSTOMY  REVERSAL, RIGID PROCTOSCOPY;  Surgeon: Leighton Ruff, MD;  Location: [facility];  Service: General;  Laterality: N/A;    There were no vitals filed for this visit.   Subjective Assessment - 06/16/21 1103     Subjective Reports he had a long vacation to [facility]. No other new changes. No dizziness to report. Just wishes his endurance was better. Reports balance is doing good, no falls.    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 No/denies                [facility] PT Assessment - 06/16/21 0001       Assessment   Medical Diagnosis Vertigo/Vestibular Schwannoma    Referring Provider (PT) Yong Channel [name], MD      Ambulation/Gait   Ambulation/Gait Yes    Ambulation/Gait Assistance 5: Supervision    Ambulation/Gait Assistance Details completed ambulation outdoors unlevel surfaces x 350 ft including pavement/gravel, mild balance challenge but no asisstance required. More notable fatigue in BLE    Ambulation Distance (Feet) 350 Feet    Assistive device None    Gait Pattern Step-through pattern;Decreased step length - right;Decreased step length - left;Wide base of support    Ambulation Surface Unlevel;Outdoor;Paved;Grass      High Level Balance   High Level Balance Comments Completed M-CTSIB: patient able to hold situation 1: 30 secs, situation 2: 20 seconds, situation 3: 30 seconds, situation 4: <5 seconds      Functional Gait  Assessment   Gait assessed  Yes    Gait Level Surface Walks 20 ft in less than 7 sec but greater than 5.5 sec, uses assistive device, slower speed, mild gait deviations, or deviates 6-10 in outside of the 12 in walkway width.    Change in Gait Speed Able to smoothly change walking speed without loss of balance or  gait deviation. Deviate no more than 6 in outside of the 12 in walkway width.    Gait with Horizontal Head Turns Performs head turns smoothly with slight change in gait velocity (eg, minor disruption to smooth gait path), deviates 6-10 in outside 12 in walkway width, or uses an assistive device.    Gait with Vertical Head Turns Performs head turns with no change in gait. Deviates no more than 6 in outside 12 in walkway width.    Gait and Pivot Turn Pivot turns safely within 3 sec and stops quickly with no loss of balance.    Step Over Obstacle Is able to step over 2 stacked shoe boxes taped together (9 in total height) without changing gait speed. No evidence of imbalance.    Gait with Narrow Base of Support Ambulates 4-7 steps.    Gait with  Eyes Closed Walks 20 ft, uses assistive device, slower speed, mild gait deviations, deviates 6-10 in outside 12 in walkway width. Ambulates 20 ft in less than 9 sec but greater than 7 sec.    Ambulating Backwards Walks 20 ft, uses assistive device, slower speed, mild gait deviations, deviates 6-10 in outside 12 in walkway width.    Steps Alternating feet, must use rail.    Total Score 23    FGA comment: 23/30                 Vestibular Assessment - 06/16/21 0001       Positional Sensitivities   Sit to Supine No dizziness    Supine to Left Side No dizziness    Supine to Right Side No dizziness    Supine to Sitting No dizziness    Right Hallpike No dizziness    Up from Right Hallpike No dizziness    Up from Left Hallpike No dizziness    Nose to Right Knee No dizziness    Right Knee to Sitting No dizziness    Nose to Left Knee No dizziness    Left Knee to Sitting No dizziness    Head Turning x 5 No dizziness    Head Nodding x 5 No dizziness    Pivot Right in Standing No dizziness    Pivot Left in Standing No dizziness    Rolling Right No dizziness    Rolling Left No dizziness               [facility] Adult PT Treatment/Exercise - 06/16/21  0001       Exercises   Exercises Other Exercises    Other Exercises  Educated on Walking Program and Handout provided. Also updated HEP to included eyes closed firm/foam. See [employer]bridge for details below.            Access Code: [email or web address] URL: [email or web address] Date: 06/16/2021 Prepared by: Baldomero Lamy   Exercises Standing March with Counter Support - 1 x daily - 7 x weekly - 3 sets - 10 reps Sit to Stand Without Arm Support - 1 x daily - 7 x weekly - 1 sets - 10 reps Heel Raise - 1 x daily - 7 x weekly - 1 sets - 10 reps Standing Hip Abduction with Counter Support - 1 x daily - 7 x weekly - 3 sets - 10 reps Romberg Stance with Head Nods - 1 x daily - 7 x weekly - 3 sets - 10 reps Standing Romberg to 1/4 Tandem Stance - 1 x daily - 7 x weekly - 3 sets - 10 reps Standing Gaze Stabilization with Head Rotation - 1 x daily - 5 x weekly - 1 sets - 10 reps Standing Gaze Stabilization with Head Nod - 1 x daily - 5 x weekly - 1 sets - 10 reps Romberg Stance with Eyes Closed - 1 x daily - 5 x weekly - 1 sets - 3 reps - 30 seconds hold Standing Balance with Eyes Closed on Foam - 1 x daily - 5 x weekly - 1 sets - 3 reps - 30 seconds hold     Also provided Walking Program - starting at 10 mins, 3x/daily.           PT Education - 06/16/21 1142     Education Details HEP Update; Walking Program; Progress toward LTGs and Updated POC    Person(s) Educated Patient    Methods Explanation;Demonstration;Handout    Comprehension Verbalized  understanding;Returned demonstration              PT Short Term Goals - 05/12/21 1244       PT SHORT TERM GOAL #1   Title Pt to be independent wtih initial HEP for strength/balance/vestibular (ALL STGs Due: 05/27/21)    Baseline no HEP established    Time 3    Period Weeks    Status New    Target Date 05/27/21      PT SHORT TERM GOAL #2   Title FGA TBA and LTG to be set as applicable    Baseline TBA    Time 3     Period Weeks    Status New      PT SHORT TERM GOAL #3   Title Patient will be able to hold situation 2 of M-CTSIB for full 30 seocnds to demo improved balance    Baseline 22 seconds    Time 3    Period Weeks    Status New               PT Long Term Goals - 06/16/21 1144       PT LONG TERM GOAL #1   Title Pt to be independent wtih final HEPand walking program (ALL LTGs Due: 06/17/21)    Baseline no HEP established; reports independence will benefit from progressive HEP    Time 6    Period Weeks    Status On-going      PT LONG TERM GOAL #2   Title LTG to be set for FGA    Baseline TBA; 23/30    Time 6    Period Weeks    Status On-going      PT LONG TERM GOAL #3   Title Patient will be able to hold situation 4 of M-CTSIB for >/= 15 seconds to demo improved vestibular input for balance    Baseline unable to complete; 5 seconds    Time 6    Period Weeks    Status Not Met      PT LONG TERM GOAL #4   Title LTG to be set for MSQ if applicable    Baseline TBA; no goal needed upon reassesment no symptoms with MSQ    Time 6    Period Weeks    Status Deferred      PT LONG TERM GOAL #5   Title Patient will be able to ambulate >/= 1000 ft outdoors unlevel surfaces without AD and Mod I for improved ability for community mobility    Baseline using SPC outdoors at times; ambulated 350 ft outdoors no AD, supervision    Time 6    Period Weeks    Status Not Met      PT LONG TERM GOAL #6   Title Patient will improve DFS >/= 59    Baseline 49    Time 6    Period Weeks    Status On-going            Updated Short Term Goals:   PT Short Term Goals - 06/16/21 1419       PT SHORT TERM GOAL #1   Title = LTGs            Updated Long Term Goals:    PT Long Term Goals - 06/16/21 1419       PT LONG TERM GOAL #1   Title Pt to be independent wtih final HEPand reports walking daily >/= 25 minutes(ALL LTGs Due: 07/22/21)  Baseline no HEP established; reports  independence will benefit from progressive HEP    Time 4    Period Weeks    Status Revised    Target Date 07/22/21      PT LONG TERM GOAL #2   Title Patient will improve FGA to >/= 25/30    Baseline TBA; 23/30    Time 4    Period Weeks    Status Revised      PT LONG TERM GOAL #3   Title Patient will be able to hold situation 4 of M-CTSIB for >/= 15 seconds to demo improved vestibular input for balance    Baseline unable to complete; 5 seconds    Time 4    Period Weeks    Status On-going      PT LONG TERM GOAL #4   Title --    Baseline --    Time --    Period --    Status --      PT LONG TERM GOAL #5   Title Patient will be able to ambulate >/= 800 ft outdoors unlevel surfaces without AD and Mod I for improved ability for community mobility    Baseline using SPC outdoors at times; ambulated 350 ft outdoors no AD, supervision    Time 4    Period Weeks    Status Revised      PT LONG TERM GOAL #6   Title Patient will improve DFS >/= 59    Baseline 49    Time 4    Period Weeks    Status On-going                  Plan - 06/16/21 1408     Clinical Impression Statement Assessed patient's progress toward all LTGs. Patient demonstrating progress toward LTG. Patient demonstrating improved balance with 23/30 on FGA, ability to maintain situation 4 of M-CTSIB for 4-5 seconds. Patient able to ambulate outdoors without AD and supervision, more notable was patient's decreased endurance. Dizziness has reoslved, just continue to present with balance impairment. Paitent is making progress with PT services and will benefit from continued PT services.    Personal Factors and Comorbidities Comorbidity 3+;Age    Comorbidities Skin Cancer, CAD, GERD, Sleep Apnea, HTN, HLD, Vestibular Schwannoma    Examination-Activity Limitations Bed Mobility;Bend;Caring for Others;Stairs    Examination-Participation Restrictions [employer] Activity    Stability/Clinical Decision Making  Stable/Uncomplicated    Rehab Potential Good    PT Frequency 2x / week    PT Duration 4 weeks    PT Treatment/Interventions ADLs/Self Care Home Management;Aquatic Therapy;Canalith Repostioning;Cryotherapy;Moist Heat;DME Instruction;Electrical Stimulation;Gait training;Stair training;Functional mobility training;Therapeutic activities;Therapeutic exercise;Balance training;Neuromuscular re-education;Patient/family education;Vestibular;Dry needling;Manual techniques;Passive range of motion;Joint Manipulations    PT Next Visit Plan focus on balance on compliant surfaces; SLS/tandem activities, Eyes Closed, Backwards walking.    PT Home Exercise Plan Medbridge [email or web address]    Consulted and Agree with Plan of Care Patient             Patient will benefit from skilled therapeutic intervention in order to improve the following deficits and impairments:  Difficulty walking, Abnormal gait, Decreased balance, Decreased endurance, Decreased strength, Decreased knowledge of use of DME, Decreased activity tolerance, Dizziness, Decreased range of motion  Visit Diagnosis: Unsteadiness on feet  Dizziness and giddiness  Other abnormalities of gait and mobility  Muscle weakness (generalized)     Problem List Patient Active Problem List   Diagnosis Date Noted   Sleep apnea 05/07/2021  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   B12 deficiency 01/06/2021   Vitamin D deficiency 01/06/2021   Aortic atherosclerosis ([place]) 01/06/2021   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[place]) 01/06/2021   Allergic rhinitis 01/06/2021   BPH associated with nocturia 01/06/2021   Abdominal wall abscess 10/28/2020   S/P hernia repair 10/13/2020   Osteoarthritis of glenohumeral joint, left 09/21/2017   Non-traumatic rotator cuff tear 09/21/2017   CAD (coronary artery disease) 08/12/2013   Essential hypertension 03/15/2011   GERD (gastroesophageal reflux disease) 03/15/2011    Jones Bales, PT, DPT 06/16/2021, 2:19  PM  [facility] [address] [place], [place], [address] Phone: [phone]   Fax:  [phone]  Name: Wesley [name] MRN: [id number] Date of Birth: 1941-06-02

## 2021-06-21 ENCOUNTER — Other Ambulatory Visit: Payer: Self-pay

## 2021-06-21 ENCOUNTER — Ambulatory Visit: Payer: Medicare Other

## 2021-06-21 DIAGNOSIS — R42 Dizziness and giddiness: Secondary | ICD-10-CM

## 2021-06-21 DIAGNOSIS — M6281 Muscle weakness (generalized): Secondary | ICD-10-CM | POA: Diagnosis not present

## 2021-06-21 DIAGNOSIS — R2681 Unsteadiness on feet: Secondary | ICD-10-CM | POA: Diagnosis not present

## 2021-06-21 DIAGNOSIS — R2689 Other abnormalities of gait and mobility: Secondary | ICD-10-CM

## 2021-06-21 NOTE — Therapy (Signed)
[facility] [address] [place], [place], [address] Phone: [phone]   Fax:  [phone]  Physical Therapy Treatment  Patient Details  Name: Wesley [name] MRN: [id number] Date of Birth: Dec 29, 1940 Referring Provider (PT): Marin Olp, MD   Encounter Date: 06/21/2021   PT End of Session - 06/21/21 1104     Visit Number 7    Number of Visits 14    Date for PT Re-Evaluation 07/22/21    Authorization Type Medicare A+B/AARP    Progress Note Due on Visit 10    PT Start Time 1103    PT Stop Time 1143    PT Time Calculation (min) 40 min    Activity Tolerance Patient tolerated treatment well    Behavior During Therapy [facility] for tasks assessed/performed             Past Medical History:  Diagnosis Date   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 symptoms (LUTS)    CAD (coronary artery disease)    Cholelithiasis    Chronic allergic rhinitis    Coronary atherosclerosis of native coronary artery    Fatty liver    GERD (gastroesophageal reflux disease)    HX 41yrs ago, no longer a problem, no meds   High blood pressure    Hypercholesterolemia    Macrocytosis without anemia    OSA on CPAP    Paraesophageal hernia    Partial bowel obstruction ([place])    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 PT denies significant findings   CARDIAC CATHETERIZATION  2008   COLOSTOMY N/A 10/31/2019   Procedure: COLOSTOMY;  Surgeon: Erroll Luna, MD;  [facility];  Service: General;  Laterality: N/A;   CYSTOSCOPY W/ URETERAL STENT PLACEMENT Bilateral 10/31/2019   Procedure: CYSTOSCOPY URETERAL  STENT PLACEMENT BILATERAL;  Surgeon: Franchot Gallo, MD;  [facility];  Service: Urology;  Laterality: Bilateral;   CYSTOSCOPY WITH STENT PLACEMENT Bilateral 03/11/2020   Procedure: CYSTOSCOPY WITH BILATERAL FIREFLY INJECTION;  Surgeon: Irine Seal, MD;  [facility];  Service: Urology;  Laterality: Bilateral;   EYE SURGERY     BILATERAL CATARACT SURGERY WITH LENS IMPLANTS   FLEXIBLE SIGMOIDOSCOPY N/A 10/29/2019   Procedure: FLEXIBLE SIGMOIDOSCOPY;  Surgeon: Jerene Bears, MD;  [facility];  Service: Gastroenterology;  Laterality: N/A;   INCISIONAL HERNIA REPAIR N/A 10/13/2020   Procedure: OPEN INCISIONAL HERNIA REPAIR WITH MESH;  Surgeon: [name], MD;  Location: [facility];  Service: General;  Laterality: N/A;   INSERTION OF MESH N/A 09/08/2016   Procedure: INSERTION OF MESH;  Surgeon: Jackolyn Confer, MD;  [facility];  Service: General;  Laterality: N/A;   IR RADIOLOGIST EVAL & MGMT  11/10/2020   IR RADIOLOGIST EVAL & MGMT  11/25/2020   LAPAROSCOPIC SIGMOID COLECTOMY N/A 10/31/2019   Procedure: DIAGNOSTIC LAPAROSCOPY; EXPLORATORY LAPAROTOMY; SIGMOID COLECTOMY;  Surgeon: Erroll Luna, MD;  [facility];  Service: General;  Laterality: N/A;   right rotator cuff     SUBMUCOSAL TATTOO INJECTION  10/29/2019   Procedure: SUBMUCOSAL TATTOO INJECTION;  Surgeon: Jerene Bears, MD;  [facility];  Service:  Gastroenterology;;   TONSILLECTOMY     UMBILICAL HERNIA REPAIR N/A 09/08/2016   Procedure: OPEN UMBILICAL HERNIA REPAIR WITH MESH;  Surgeon: Jackolyn Confer, MD;  [facility];  Service: General;  Laterality: N/A;   VENTRAL HERNIA REPAIR N/A 02/03/2021   Procedure: LAPAROSCOPIC VENTRAL HERNIA REPAIR WITH MESH;  Surgeon: [name], MD;  Location: [facility];  Service: General;  Laterality: N/A;   XI ROBOTIC ASSISTED COLOSTOMY TAKEDOWN N/A 03/11/2020   Procedure: ROBOTIC ASSISTED COLOSTOMY REVERSAL, RIGID PROCTOSCOPY;  Surgeon: Leighton Ruff, MD;   [facility];  Service: General;  Laterality: N/A;    There were no vitals filed for this visit.   Subjective Assessment - 06/21/21 1106     Subjective Patient reports on Sunday he was fatigued, but not sure why. Reports still completing exercises. No falls.    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 No/denies                 [facility] Adult PT Treatment/Exercise - 06/21/21 0001       Ambulation/Gait   Ambulation/Gait Yes    Ambulation/Gait Assistance 5: Supervision    Assistive device None    Gait Pattern Step-through pattern;Decreased step length - right;Decreased step length - left;Wide base of support    Ambulation Surface Level;Indoor      Exercises   Exercises Knee/Hip      Knee/Hip Exercises: Aerobic   Other Aerobic Completed SciFit to focus on endurance interval trianing: completed on Level 3.0 x 4 minutes, followed by rest break for approx 2 minutes, then additional 4 minutes. Mild fatigue after completion. PT educating on proper breathing and avoidance on holding breath with activity.             Vestibular Treatment/Exercise - 06/21/21 0001       Vestibular Treatment/Exercise   Vestibular Treatment Provided Gaze    Gaze Exercises X1 Viewing Horizontal;X1 Viewing Vertical      X1 Viewing Horizontal   Foot Position standing bil stance    Reps 2    Comments x 60 seconds. cues for pace, duration, and focus on continous head motion.      X1 Viewing Vertical   Foot Position standing bil stance    Reps 2    Comments x 60 seconds. cues for pace, duration, and focus on continous head motion.                Balance Exercises - 06/21/21 0001       Balance Exercises: Standing   Standing Eyes Opened  Foam/compliant surface;Narrow base of support (BOS);Head turns;Limitations    Standing Eyes Opened Limitations bil stance on airex, completed horizontal/vertical head turns x 10 reps. more imbalance noted with horiz > veritcal    Standing Eyes Closed Wide (BOA);Narrow base of support (BOS);Foam/compliant surface;3 reps;30 secs;Limitations    Standing Eyes Closed Limitations light fingertip support with eyes closed, 3 x 30 seconds. progressing toward narrow BOS as possible.    SLS with Vectors Foam/compliant surface;Intermittent upper extremity assist;Limitations    SLS with Vectors Limitations standing on airex with intermittent UE support, completed alternating toe taps to cones, initially starting with BUE support > single UE support. unable to complete iwthout UE support. Completed  x 15 reps bilat.    Other Standing Exercises Comments intermittent rest breaks due to fatigue.                PT Education - 06/21/21 1121     Education Details joining [employer] for further benefit outside of PT services    Person(s) Educated Patient    Methods Explanation    Comprehension Verbalized understanding              PT Short Term Goals - 06/16/21 1419       PT SHORT TERM GOAL #1   Title = LTGs               PT Long Term Goals - 06/16/21 1419       PT LONG TERM GOAL #1   Title Pt to be independent wtih final HEPand reports walking daily >/= 25 minutes(ALL LTGs Due: 07/22/21)    Baseline no HEP established; reports independence will benefit from progressive HEP    Time 4    Period Weeks    Status Revised    Target Date 07/22/21      PT LONG TERM GOAL #2   Title Patient will improve FGA to >/= 25/30    Baseline TBA; 23/30    Time 4    Period Weeks    Status Revised      PT LONG TERM GOAL #3   Title Patient will be able to hold situation 4 of M-CTSIB for >/= 15 seconds to demo improved vestibular input for balance    Baseline unable to complete; 5 seconds    Time 4     Period Weeks    Status On-going      PT LONG TERM GOAL #4   Title --    Baseline --    Time --    Period --    Status --      PT LONG TERM GOAL #5   Title Patient will be able to ambulate >/= 800 ft outdoors unlevel surfaces without AD and Mod I for improved ability for community mobility    Baseline using SPC outdoors at times; ambulated 350 ft outdoors no AD, supervision    Time 4    Period Weeks    Status Revised      PT LONG TERM GOAL #6   Title Patient will improve DFS >/= 59    Baseline 49    Time 4    Period Weeks    Status On-going                   Plan - 06/21/21 1119     Clinical Impression Statement Initiated endurance training on SciFit with focus on activity tolerance and interval training, some mild fatigue noted requiring intermittent rest breaks. Rest of session focused on continued balance, with focus on eyes closed and complaint surfaces. Continue to demo decreased vestibular input with balance activities.    Personal Factors and Comorbidities Comorbidity 3+;Age    Comorbidities Skin Cancer, CAD, GERD, Sleep Apnea, HTN, HLD, Vestibular Schwannoma    Examination-Activity Limitations Bed Mobility;Bend;Caring for Others;Stairs    Examination-Participation Restrictions [employer] Activity    Stability/Clinical Decision Making Stable/Uncomplicated    Rehab Potential Good    PT Frequency 2x / week    PT Duration 4 weeks    PT Treatment/Interventions ADLs/Self Care Home Management;Aquatic Therapy;Canalith Repostioning;Cryotherapy;Moist Heat;DME Instruction;Electrical Stimulation;Gait training;Stair training;Functional mobility training;Therapeutic activities;Therapeutic exercise;Balance training;Neuromuscular re-education;Patient/family education;Vestibular;Dry needling;Manual techniques;Passive range of  motion;Joint Manipulations    PT Next Visit Plan focus on balance on compliant surfaces; SLS/tandem activities, Eyes Closed, Backwards walking.  Progress VOR    PT Home Exercise Plan Medbridge [id number]    Consulted and Agree with Plan of Care Patient             Patient will benefit from skilled therapeutic intervention in order to improve the following deficits and impairments:  Difficulty walking, Abnormal gait, Decreased balance, Decreased endurance, Decreased strength, Decreased knowledge of use of DME, Decreased activity tolerance, Dizziness, Decreased range of motion  Visit Diagnosis: Unsteadiness on feet  Other abnormalities of gait and mobility  Muscle weakness (generalized)  Dizziness and giddiness     Problem List Patient Active Problem List   Diagnosis Date Noted   Sleep apnea 05/07/2021   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   B12 deficiency 01/06/2021   Vitamin D deficiency 01/06/2021   Aortic atherosclerosis ([place]) 01/06/2021   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 [place], [place], [address] Phone: [phone]   Fax:  [phone]  Name: MIKLO AKEN MRN: [id number] Date of Birth: November 12, 1940

## 2021-06-22 ENCOUNTER — Ambulatory Visit: Payer: Medicare Other

## 2021-06-22 DIAGNOSIS — R2689 Other abnormalities of gait and mobility: Secondary | ICD-10-CM | POA: Diagnosis not present

## 2021-06-22 DIAGNOSIS — D333 Benign neoplasm of cranial nerves: Secondary | ICD-10-CM | POA: Diagnosis not present

## 2021-06-24 ENCOUNTER — Ambulatory Visit: Payer: Medicare Other

## 2021-06-24 ENCOUNTER — Other Ambulatory Visit: Payer: Self-pay

## 2021-06-24 DIAGNOSIS — R2689 Other abnormalities of gait and mobility: Secondary | ICD-10-CM

## 2021-06-24 DIAGNOSIS — R42 Dizziness and giddiness: Secondary | ICD-10-CM

## 2021-06-24 DIAGNOSIS — R2681 Unsteadiness on feet: Secondary | ICD-10-CM

## 2021-06-24 DIAGNOSIS — M6281 Muscle weakness (generalized): Secondary | ICD-10-CM

## 2021-06-24 NOTE — Therapy (Signed)
[facility] [address] [place], [place], [address] Phone: [phone]   Fax:  [phone]  Physical Therapy Treatment  Patient Details  Name: Wesley [name] MRN: [id number] Date of Birth: 08-24-1940 Referring Provider (PT): Marin Olp, MD   Encounter Date: 06/24/2021   PT End of Session - 06/24/21 1321     Visit Number 8    Number of Visits 14    Date for PT Re-Evaluation 07/22/21    Authorization Type Medicare A+B/AARP    Progress Note Due on Visit 10    PT Start Time 3335    PT Stop Time 4562    PT Time Calculation (min) 42 min    Activity Tolerance Patient tolerated treatment well    Behavior During Therapy [facility] for tasks assessed/performed             Past Medical History:  Diagnosis Date   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 symptoms (LUTS)    CAD (coronary artery disease)    Cholelithiasis    Chronic allergic rhinitis    Coronary atherosclerosis of native coronary artery    Fatty liver    GERD (gastroesophageal reflux disease)    HX 25yrs ago, no longer a problem, no meds   High blood pressure    Hypercholesterolemia    Macrocytosis without anemia    OSA on CPAP    Paraesophageal hernia    Partial bowel obstruction ([place])    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 PT denies significant findings   CARDIAC CATHETERIZATION  2008   COLOSTOMY N/A 10/31/2019   Procedure: COLOSTOMY;  Surgeon: Erroll Luna, MD;  [facility];  Service: General;  Laterality: N/A;   CYSTOSCOPY W/ URETERAL STENT PLACEMENT Bilateral 10/31/2019   Procedure: CYSTOSCOPY  URETERAL STENT PLACEMENT BILATERAL;  Surgeon: Franchot Gallo, MD;  [facility];  Service: Urology;  Laterality: Bilateral;   CYSTOSCOPY WITH STENT PLACEMENT Bilateral 03/11/2020   Procedure: CYSTOSCOPY WITH BILATERAL FIREFLY INJECTION;  Surgeon: Irine Seal, MD;  [facility];  Service: Urology;  Laterality: Bilateral;   EYE SURGERY     BILATERAL CATARACT SURGERY WITH LENS IMPLANTS   FLEXIBLE SIGMOIDOSCOPY N/A 10/29/2019   Procedure: FLEXIBLE SIGMOIDOSCOPY;  Surgeon: Jerene Bears, MD;  [facility];  Service: Gastroenterology;  Laterality: N/A;   INCISIONAL HERNIA REPAIR N/A 10/13/2020   Procedure: OPEN INCISIONAL HERNIA REPAIR WITH MESH;  Surgeon: [name], MD;  Location: [facility];  Service: General;  Laterality: N/A;   INSERTION OF MESH N/A 09/08/2016   Procedure: INSERTION OF MESH;  Surgeon: Jackolyn Confer, MD;  [facility];  Service: General;  Laterality: N/A;   IR RADIOLOGIST EVAL & MGMT  11/10/2020   IR RADIOLOGIST EVAL & MGMT  11/25/2020   LAPAROSCOPIC SIGMOID COLECTOMY N/A 10/31/2019   Procedure: DIAGNOSTIC LAPAROSCOPY; EXPLORATORY LAPAROTOMY; SIGMOID COLECTOMY;  Surgeon: Erroll Luna, MD;  [facility];  Service: General;  Laterality: N/A;   right rotator cuff     SUBMUCOSAL TATTOO INJECTION  10/29/2019   Procedure: SUBMUCOSAL TATTOO INJECTION;  Surgeon: Jerene Bears, MD;  [facility];  Service:  Gastroenterology;;   TONSILLECTOMY     UMBILICAL HERNIA REPAIR N/A 09/08/2016   Procedure: OPEN UMBILICAL HERNIA REPAIR WITH MESH;  Surgeon: Jackolyn Confer, MD;  [facility];  Service: General;  Laterality: N/A;   VENTRAL HERNIA REPAIR N/A 02/03/2021   Procedure: LAPAROSCOPIC VENTRAL HERNIA REPAIR WITH MESH;  Surgeon: [name], MD;  Location: [facility];  Service: General;  Laterality: N/A;   XI ROBOTIC ASSISTED COLOSTOMY TAKEDOWN N/A 03/11/2020   Procedure: ROBOTIC ASSISTED COLOSTOMY REVERSAL, RIGID PROCTOSCOPY;  Surgeon: Leighton Ruff, MD;   [facility];  Service: General;  Laterality: N/A;    There were no vitals filed for this visit.   Subjective Assessment - 06/24/21 1320     Subjective No new changes/complaints to report. Reports some discomfort in the low back today, no pain more tightness.    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 No/denies              [facility] Adult PT Treatment/Exercise - 06/24/21 0001       Transfers   Transfers Sit to Stand;Stand to Sit    Sit to Stand 5: Supervision;With upper extremity assist;Without upper extremity assist;From bed;From chair/3-in-1    Stand to Sit 5: Supervision;With upper extremity assist;Without upper extremity assist;To bed;To chair/3-in-1      Ambulation/Gait   Ambulation/Gait Yes    Ambulation/Gait Assistance 5: Supervision    Assistive device None    Gait Pattern Step-through pattern;Decreased step length - right;Decreased step length - left;Wide base of support    Ambulation Surface Level;Indoor      Therapeutic Activites    Therapeutic Activities Other Therapeutic Activities    Other Therapeutic Activities Completed gait with visual tracking horiozntal/vertical with tracking ball 2 x 50' each direction, more imbalance with horizontal > vertical. then complted VOR x 1 with ambulation with horizontal/vertical, 2 x 50', increased challenge with smoot head movement, intermittent stumbles to R/L requiring CGA from PT. Patient tolerating well overall no dizziness, more imbalance noted.      Exercises   Exercises Knee/Hip      Knee/Hip Exercises: Aerobic   Other Aerobic Completed SciFit to continue focus on endurance training: completed on Level 3.0 x 5 minutes, followed by rest break for approx 2 minutes. patient  continue to tolerate increase in time well.             Vestibular Treatment/Exercise - 06/24/21 0001       Vestibular Treatment/Exercise   Vestibular Treatment Provided Gaze    Gaze Exercises X1 Viewing Horizontal;X1 Viewing Vertical      X1 Viewing Horizontal   Foot Position standing bil stance on airex    Reps 1    Comments x 60 seconds, mild unsteadiness, one instance of CGA required      X1 Viewing Vertical   Foot Position standing bil stance on airex    Reps 1    Comments x 60 seconds,  mild unsteadiness, CGA                Balance Exercises - 06/24/21 0001       Balance Exercises: Standing   Rockerboard Anterior/posterior;Head turns;EO;Intermittent UE support;Limitations    Rockerboard Limitations standing on rockerboard positioned A/P with eyes open  working on maintaining steady 2 x 30 seonds, require single UE support to // bars. then maintaining light touch to // bars, added in horizontal/vertical head turns x 10 reps each. more challenge noted with vertical > horizontal on rockerboard noted.    Sit to Stand Standard surface;Foam/compliant surface;Limitations    Sit to Stand Limitations on airex without UE support, 2 x 5 reps. close supervision, cues to avoid using mat to stabilize on upward                PT Education - 06/24/21 1356     Education Details continue focus on walking program for endurance    Person(s) Educated Patient    Methods Explanation    Comprehension Verbalized understanding              PT Short Term Goals - 06/16/21 1419       PT SHORT TERM GOAL #1   Title = LTGs               PT Long Term Goals - 06/16/21 1419       PT LONG TERM GOAL #1   Title Pt to be independent wtih final HEPand reports walking daily >/= 25 minutes(ALL LTGs Due: 07/22/21)    Baseline no HEP established; reports independence will benefit from progressive HEP    Time 4    Period Weeks    Status Revised    Target Date 07/22/21       PT LONG TERM GOAL #2   Title Patient will improve FGA to >/= 25/30    Baseline TBA; 23/30    Time 4    Period Weeks    Status Revised      PT LONG TERM GOAL #3   Title Patient will be able to hold situation 4 of M-CTSIB for >/= 15 seconds to demo improved vestibular input for balance    Baseline unable to complete; 5 seconds    Time 4    Period Weeks    Status On-going      PT LONG TERM GOAL #4   Title --    Baseline --    Time --    Period --    Status --      PT LONG TERM GOAL #5   Title Patient will be able to ambulate >/= 800 ft outdoors unlevel surfaces without AD and Mod I for improved ability for community mobility    Baseline using SPC outdoors at times; ambulated 350 ft outdoors no AD, supervision    Time 4    Period Weeks    Status Revised      PT LONG TERM GOAL #6   Title Patient will improve DFS >/= 59    Baseline 49    Time 4    Period Weeks    Status On-going                   Plan - 06/24/21 1352     Clinical Impression Statement Continued progression of VOR and visual tracking activities with ambulation, more imbalance noted with ambulation vs static standing. Continued balance focused on improved balance strategies and adding in horizontal/vertical head turns. Continued endurance training with patient tolerating well. Will continue per POC.    Personal Factors and Comorbidities Comorbidity 3+;Age    Comorbidities Skin Cancer, CAD, GERD, Sleep Apnea, HTN, HLD, Vestibular Schwannoma    Examination-Activity Limitations Bed Mobility;Bend;Caring for Others;Stairs    Examination-Participation Restrictions [employer] Activity  Stability/Clinical Decision Making Stable/Uncomplicated    Rehab Potential Good    PT Frequency 2x / week    PT Duration 4 weeks    PT Treatment/Interventions ADLs/Self Care Home Management;Aquatic Therapy;Canalith Repostioning;Cryotherapy;Moist Heat;DME Instruction;Electrical Stimulation;Gait training;Stair  training;Functional mobility training;Therapeutic activities;Therapeutic exercise;Balance training;Neuromuscular re-education;Patient/family education;Vestibular;Dry needling;Manual techniques;Passive range of motion;Joint Manipulations    PT Next Visit Plan focus on balance on compliant surfaces; SLS/tandem activities, Eyes Closed, Backwards walking. Progress VOR    PT Home Exercise Plan Medbridge [id number]    Consulted and Agree with Plan of Care Patient             Patient will benefit from skilled therapeutic intervention in order to improve the following deficits and impairments:  Difficulty walking, Abnormal gait, Decreased balance, Decreased endurance, Decreased strength, Decreased knowledge of use of DME, Decreased activity tolerance, Dizziness, Decreased range of motion  Visit Diagnosis: Unsteadiness on feet  Other abnormalities of gait and mobility  Muscle weakness (generalized)  Dizziness and giddiness     Problem List Patient Active Problem List   Diagnosis Date Noted   Sleep apnea 05/07/2021   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   B12 deficiency 01/06/2021   Vitamin D deficiency 01/06/2021   Aortic atherosclerosis ([place]) 01/06/2021   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[place]) 01/06/2021   Allergic rhinitis 01/06/2021   BPH associated with nocturia 01/06/2021   Abdominal wall abscess 10/28/2020   S/P hernia repair 10/13/2020   Osteoarthritis of glenohumeral joint, left 09/21/2017   Non-traumatic rotator cuff tear 09/21/2017   CAD (coronary artery disease) 08/12/2013   Essential hypertension 03/15/2011   GERD (gastroesophageal reflux disease) 03/15/2011    Jones Bales, PT, DPT 06/24/2021, 3:31 PM  [facility] [address] [place], [place], [address] Phone: [phone]   Fax:  [phone]  Name: Wesley [name] MRN: [id number] Date of Birth: 12-28-1940

## 2021-06-27 DIAGNOSIS — H90A22 Sensorineural hearing loss, unilateral, left ear, with restricted hearing on the contralateral side: Secondary | ICD-10-CM | POA: Diagnosis not present

## 2021-06-27 DIAGNOSIS — H90A31 Mixed conductive and sensorineural hearing loss, unilateral, right ear with restricted hearing on the contralateral side: Secondary | ICD-10-CM | POA: Diagnosis not present

## 2021-06-29 ENCOUNTER — Ambulatory Visit: Payer: Medicare Other | Admitting: Physical Therapy

## 2021-06-29 ENCOUNTER — Encounter: Payer: Self-pay | Admitting: Family Medicine

## 2021-07-01 ENCOUNTER — Ambulatory Visit: Payer: Medicare Other | Admitting: Physical Therapy

## 2021-07-04 NOTE — Progress Notes (Incomplete)
Phone [phone] In person visit   Subjective:   Wesley [name] is a 80 y.o. year old very pleasant male patient who presents for/with See problem oriented charting No chief complaint on file.   This visit occurred during the SARS-CoV-2 public health emergency.  Safety protocols were in place, including screening questions prior to the visit, additional usage of staff PPE, and extensive cleaning of exam room while observing appropriate contact time as indicated for disinfecting solutions.   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 Swallow whole.     atorvastatin (LIPITOR) 80 MG tablet TAKE 1 TABLET EVERY DAY 90 tablet 2   Cholecalciferol (VITAMIN D3) 25 MCG (1000 UT) CAPS Take 1,000 Units by mouth daily.     finasteride (PROSCAR) 5 MG tablet Take 5 mg by mouth daily.     lisinopril (ZESTRIL) 10 MG tablet Take 10 mg by mouth daily.     loratadine (CLARITIN) 10 MG tablet Take 10 mg by mouth daily.     magnesium oxide (MAG-OX) 400 MG tablet Take 400 mg by mouth daily.     meclizine (ANTIVERT) 25 MG tablet Take 1 tablet (25 mg total) by mouth 3 (three) times daily as needed for up to 15 doses for  dizziness. 15 tablet 0   Omega-3 Fatty Acids (FISH OIL) 1200 MG CAPS Take 1,200 mg by mouth daily.     No current facility-administered medications for this visit.     Objective:  There were no vitals taken for this visit. Gen: NAD, resting comfortably CV: RRR no murmurs rubs or gallops Lungs: CTAB no crackles, wheeze, rhonchi Abdomen: soft/nontender/nondistended/normal bowel sounds. No rebound or guarding.  Ext: no edema Skin: warm, dry Neuro: grossly normal, moves all extremities  ***    Assessment and Plan   # ED F/U for dizziness/fall- possible BPPV but also found to have #Vestibular Schwannoma  S: Patient with history of abdominal wall abscess after hernia repair early March 2022-later we referred him to outpatient physical therapy to help regain strength after surgery-he had been seeing them through mid August   Patient was seen by Marlaine Hind on 04/04/2021 for dizziness and fall 2 days prior. He stated that he fell 2 days ago from visit and hit his head on furniture. He was not feeling dizzy/off balance to this but describes mechanical fall. No LOC reported. He denied any headache, pain, neck pain or back pain or chest pains   Developed dizziness the day after fall and hitting head. He felt a room spinning sensation and felt very unsteady resorting to using a walker. He also denied any sensation of weakness or numbness.  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 While patient was in the emergency room updated MRI of brain with and without contrast-7 mm x 5 mm vestibular schwannoma-this was discussed with Dr. Christella Noa of neurosurgery who did not think this needed emergent surgery. In fact after visit patient was called to  be set up with interventional radiology visit it sounds like - patient stated he was somewhat steered toward radiology intervention instead of neurosurgery. He did not think it was the cause of patient's symptoms.  Patient was given Ativan and symptoms improved-was able to ambulate without his walker without difficulty- patient stated this helps him more than meclizine.He was given the option of being monitored but he preferred to return home and follow-up with [place] in the office. He had some incontinence with lorazepam.    Patient reached out to [place] and we ordered an urgent referral to Dr. Benjamine Mola as the original office he was referred to could not see him until October 10 with [facility] Dr. Constance Holster formerly [facility].    From initial MRI "2. 1 cm soft tissue lesion involving the right IAC, suspicious for a small mass, possibly schwannoma. Further evaluation with dedicated IAC protocol MRI, with and without contrast, suggested for further evaluation. 3. Age-related cerebral atrophy with mild chronic small vessel ischemic disease, with a few small remote left cerebellar infarcts.  " From 2nd MRI "There is a 7 mm by 5 mm enhancing lesion in the right IAC. There is no evidence of extension into the cochlea." - will refer to vestibular rehab in case this is BPPV.  -we discussed prior remote cerebellar infarcts- I do not think those would acutely cause balance issues but we should certainly modify risk factors to reduce risk -I also messaged Dr. Redmond Baseman to see if there is anyway possible to have patient be evaluated sooner A/P: ***   #hypertension with element of whitecoat hypertension S: medication: Lisinopril 10 mg daily   -was mild poor control but reported history of whitecoat hypertension BP Readings from Last 3 Encounters:  04/19/21 (!) 141/79  04/11/21 134/88  04/08/21 (!) 156/103  A/P: ***   #hyperlipidemia/CAD/ Aortic Atherosclerosis S: Medication:Atorvastatin 80 mg every day and  fish oil 1200 mg daily. Asprin 81 mg daily - a Hx of CAD but was not on Aspirin 81 mg daily. His last visit in person with Dr. Marlou Porch was in 2019. Aortic atherosclerosis was noted on CT. He does want him to start back on in. -lipids panel on 01/2021-looked excellent Lab Results  Component Value Date   CHOL 107 01/17/2021   HDL 41.90 01/17/2021   LDLCALC 52 01/17/2021   TRIG 64.0 01/17/2021   CHOLHDL 3 01/17/2021   A/P: ***  Health Maintenance Due  Topic Date Due   Zoster Vaccines- Shingrix (2 of 2) 06/01/2020   COVID-19 Vaccine (5 - Booster for Pfizer series) 02/07/2021   INFLUENZA VACCINE  03/14/2021   Recommended follow up: No follow-ups on file. Future Appointments  Date Time Provider [place]  07/11/2021  9:20 AM Marin Olp, MD [facility]  07/11/2021 11:00 AM Jones Bales, PT [facility]  07/13/2021  1:15 PM Jones Bales, PT [facility]  07/19/2021 11:00 AM Jones Bales, PT [facility]  07/21/2021  1:15 PM Drema Balzarine, PTA [facility]  10/10/2021 11:30 AM [place],  Acey Lav, MD [facility] None    Lab/Order associations: No diagnosis found.  No orders of the defined types were placed in this encounter.   I,Jada Bradford,acting as a scribe for Garret Reddish, MD.,have documented all relevant documentation on the behalf of Garret Reddish, MD,as directed by  Garret Reddish, MD while in the presence of Garret Reddish, MD.  *** Return precautions advised.  Burnett Corrente

## 2021-07-11 ENCOUNTER — Ambulatory Visit: Payer: Medicare Other

## 2021-07-11 ENCOUNTER — Ambulatory Visit: Payer: Medicare Other | Admitting: Family Medicine

## 2021-07-11 ENCOUNTER — Other Ambulatory Visit: Payer: Self-pay

## 2021-07-11 DIAGNOSIS — R42 Dizziness and giddiness: Secondary | ICD-10-CM

## 2021-07-11 DIAGNOSIS — I2510 Atherosclerotic heart disease of native coronary artery without angina pectoris: Secondary | ICD-10-CM

## 2021-07-11 DIAGNOSIS — R2689 Other abnormalities of gait and mobility: Secondary | ICD-10-CM | POA: Diagnosis not present

## 2021-07-11 DIAGNOSIS — I700 Atherosclerosis of aorta: Secondary | ICD-10-CM

## 2021-07-11 DIAGNOSIS — R2681 Unsteadiness on feet: Secondary | ICD-10-CM

## 2021-07-11 DIAGNOSIS — M6281 Muscle weakness (generalized): Secondary | ICD-10-CM

## 2021-07-11 DIAGNOSIS — D333 Benign neoplasm of cranial nerves: Secondary | ICD-10-CM

## 2021-07-11 DIAGNOSIS — I10 Essential (primary) hypertension: Secondary | ICD-10-CM

## 2021-07-11 DIAGNOSIS — E785 Hyperlipidemia, unspecified: Secondary | ICD-10-CM

## 2021-07-11 NOTE — Therapy (Signed)
[place] [address] [place], [place], [address] Phone: [phone]   Fax:  [phone]  Physical Therapy Treatment  Patient Details  Name: Wesley [name] MRN: [id number] Date of Birth: 1940-10-26 Referring Provider (PT): Marin Olp, MD   Encounter Date: 07/11/2021   PT End of Session - 07/11/21 1104     Visit Number 9    Number of Visits 14    Date for PT Re-Evaluation 07/22/21    Authorization Type Medicare A+B/AARP    Progress Note Due on Visit 10    PT Start Time 1102    PT Stop Time 1144    PT Time Calculation (min) 42 min    Activity Tolerance Patient tolerated treatment well    Behavior During Therapy 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 PT denies significant findings   CARDIAC CATHETERIZATION  2008   COLOSTOMY N/A 10/31/2019   Procedure: COLOSTOMY;  Surgeon: Erroll Luna, MD;  Location: [facility];  Service: General;  Laterality: N/A;   CYSTOSCOPY W/ URETERAL STENT PLACEMENT Bilateral 10/31/2019   Procedure: CYSTOSCOPY  URETERAL STENT PLACEMENT BILATERAL;  Surgeon: Franchot Gallo, MD;  Location: [facility];  Service: Urology;  Laterality: Bilateral;   CYSTOSCOPY WITH STENT PLACEMENT Bilateral 03/11/2020   Procedure: CYSTOSCOPY WITH BILATERAL FIREFLY INJECTION;  Surgeon: Irine Seal, MD;  Location: [facility];  Service: Urology;  Laterality: Bilateral;   EYE SURGERY     BILATERAL CATARACT SURGERY WITH LENS IMPLANTS   FLEXIBLE SIGMOIDOSCOPY N/A 10/29/2019   Procedure: FLEXIBLE SIGMOIDOSCOPY;  Surgeon: Jerene Bears, MD;  [facility];  Service: Gastroenterology;  Laterality: N/A;   INCISIONAL HERNIA REPAIR N/A 10/13/2020   Procedure: OPEN INCISIONAL HERNIA REPAIR WITH MESH;  Surgeon: [name], MD;  Location: [place];  Service: General;  Laterality: N/A;   INSERTION OF MESH N/A 09/08/2016   Procedure: INSERTION OF MESH;  Surgeon: Jackolyn Confer, MD;  [facility]: [facility];  Service: General;  Laterality: N/A;   IR RADIOLOGIST EVAL & MGMT  11/10/2020   IR RADIOLOGIST EVAL & MGMT  11/25/2020   LAPAROSCOPIC SIGMOID COLECTOMY N/A 10/31/2019   Procedure: DIAGNOSTIC LAPAROSCOPY; EXPLORATORY LAPAROTOMY; SIGMOID COLECTOMY;  Surgeon: Erroll Luna, MD;  [facility];  Service: General;  Laterality: N/A;   right rotator cuff     SUBMUCOSAL TATTOO INJECTION  10/29/2019   Procedure: SUBMUCOSAL TATTOO INJECTION;  Surgeon: Jerene Bears, MD;  [facility];  Service:  Gastroenterology;;   TONSILLECTOMY     UMBILICAL HERNIA REPAIR N/A 09/08/2016   Procedure: OPEN UMBILICAL HERNIA REPAIR WITH MESH;  Surgeon: Jackolyn Confer, MD;  [facility];  Service: General;  Laterality: N/A;   VENTRAL HERNIA REPAIR N/A 02/03/2021   Procedure: LAPAROSCOPIC VENTRAL HERNIA REPAIR WITH MESH;  Surgeon: [name], MD;  Location: [place];  Service: General;  Laterality: N/A;   XI ROBOTIC ASSISTED COLOSTOMY TAKEDOWN N/A 03/11/2020   Procedure: ROBOTIC ASSISTED COLOSTOMY REVERSAL, RIGID PROCTOSCOPY;  Surgeon: Leighton Ruff, MD;   [facility];  Service: General;  Laterality: N/A;    There were no vitals filed for this visit.   Subjective Assessment - 07/11/21 1104     Subjective Reports that he feels very fatigued due to the holiday, reports that he also did not sleep well. No pain.    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 No/denies               [facility] Adult PT Treatment/Exercise - 07/11/21 0001       Transfers   Transfers Sit to Stand;Stand to Sit      Exercises   Exercises Knee/Hip      Knee/Hip Exercises: Aerobic   Other Aerobic Completed SciFit to continue focus on endurance training: completed on Level 2.5 (due to fatigue) x 5 minutes, followed by rest break for approx 2 minute. then completed additional x 3 minutes with patient tolerating increase in time well.             Vestibular Treatment/Exercise - 07/11/21 0001       Vestibular Treatment/Exercise   Vestibular Treatment Provided Gaze    Gaze Exercises X1 Viewing Horizontal;X1 Viewing Vertical      X1 Viewing Horizontal   Foot Position standing bil stance on airex; narrow BOS on firm surface    Reps 2    Comments x 60 seconds, require UE support today due to weakness on airex      X1 Viewing Vertical   Foot Position standing bil stance on airex; narrow BOS on firm surface    Reps 2    Comments x 60 seconds, require UE support today due to weakness on airex                Balance Exercises - 07/11/21 0001       Balance Exercises: Standing   Standing Eyes Opened Narrow base of support (BOS);Foam/compliant surface;Head turns;Limitations    Standing Eyes Opened Limitations Narrow stance on airex, completed horizontal/vertical head turns x 10 reps. light touch     Standing Eyes Closed Wide (BOA);Foam/compliant surface;3 reps;30 secs;Limitations    Standing Eyes Closed Limitations light fingertip support with eyes closed, 3 x 30 seconds. progression with addition of horizontal/vertical head turns x 10 reps. CGA.    Other Standing Exercises Comments intermittent rest breaks due to increased fatigue today.                  PT Short Term Goals - 06/16/21 1419       PT SHORT TERM GOAL #1   Title = LTGs               PT Long Term Goals - 06/16/21  [place] #1   Title Pt to be independent wtih final HEPand reports walking daily >/= 25 minutes(ALL LTGs Due: 07/22/21)    Baseline no HEP established; reports independence will benefit from progressive HEP    Time 4    Period Weeks    Status Revised    Target Date 07/22/21      PT LONG TERM GOAL #2   Title Patient will improve FGA to >/= 25/30    Baseline TBA; 23/30    Time 4    Period Weeks    Status Revised      PT LONG TERM GOAL #3   Title Patient will be able to hold situation 4 of M-CTSIB for >/= 15 seconds to demo improved vestibular input for balance    Baseline unable to complete; 5 seconds    Time 4    Period Weeks    Status On-going      PT LONG TERM GOAL #4   Title --    Baseline --    Time --    Period --    Status --      PT LONG TERM GOAL #5   Title Patient will be able to ambulate >/= 800 ft outdoors unlevel surfaces without AD and Mod I for improved ability for community mobility    Baseline using SPC outdoors at times; ambulated 350 ft outdoors no AD, supervision    Time 4    Period Weeks    Status Revised      PT LONG TERM GOAL #6   Title Patient will improve DFS >/= 59    Baseline 49    Time 4    Period Weeks    Status On-going                   Plan - 07/11/21 1115     Clinical Impression Statement Continued endurance training on SciFit patient tolerating increased time today, but still have some mild fatigue from  holiday weekend requiring reduced resistance.Rest of session focused on continued balance exercises, with focus on vestibular input. Will continue to progress toward all LTGs.    Personal Factors and Comorbidities Comorbidity 3+;Age    Comorbidities Skin Cancer, CAD, GERD, Sleep Apnea, HTN, HLD, Vestibular Schwannoma    Examination-Activity Limitations Bed Mobility;Bend;Caring for Others;Stairs    Examination-Participation Restrictions [employer] Activity    Stability/Clinical Decision Making Stable/Uncomplicated    Rehab Potential Good    PT Frequency 2x / week    PT Duration 4 weeks    PT Treatment/Interventions ADLs/Self Care Home Management;Aquatic Therapy;Canalith Repostioning;Cryotherapy;Moist Heat;DME Instruction;Electrical Stimulation;Gait training;Stair training;Functional mobility training;Therapeutic activities;Therapeutic exercise;Balance training;Neuromuscular re-education;Patient/family education;Vestibular;Dry needling;Manual techniques;Passive range of motion;Joint Manipulations    PT Next Visit Plan focus on balance on compliant surfaces; SLS/tandem activities, Eyes Closed, Backwards walking. Progress VOR    PT Home Exercise Plan [employer] [id number]    Consulted and Agree with Plan of Care Patient             Patient will benefit from skilled therapeutic intervention in order to improve the following deficits and impairments:  Difficulty walking, Abnormal gait, Decreased balance, Decreased endurance, Decreased strength, Decreased knowledge of use of DME, Decreased activity tolerance, Dizziness, Decreased range of motion  Visit Diagnosis: Unsteadiness on feet  Other abnormalities of gait and mobility  Muscle weakness (generalized)  Dizziness and giddiness     Problem List Patient Active Problem List  Diagnosis Date Noted   Sleep apnea 05/07/2021   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   B12 deficiency 01/06/2021   Vitamin D deficiency 01/06/2021    Aortic atherosclerosis ([place]) 01/06/2021   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[place]) 01/06/2021   Allergic rhinitis 01/06/2021   BPH associated with nocturia 01/06/2021   Abdominal wall abscess 10/28/2020   S/P hernia repair 10/13/2020   Osteoarthritis of glenohumeral joint, left 09/21/2017   Non-traumatic rotator cuff tear 09/21/2017   CAD (coronary artery disease) 08/12/2013   Essential hypertension 03/15/2011   GERD (gastroesophageal reflux disease) 03/15/2011    Jones Bales, PT, DPT 07/11/2021, 11:56 AM  [place] [address] [place], [place], [address] Phone: [phone]   Fax:  [phone]  Name: Wesley [name] MRN: [id number] Date of Birth: 1940-12-25

## 2021-07-13 ENCOUNTER — Encounter: Payer: Self-pay | Admitting: Physical Therapy

## 2021-07-13 ENCOUNTER — Other Ambulatory Visit: Payer: Self-pay

## 2021-07-13 ENCOUNTER — Ambulatory Visit: Payer: Medicare Other | Admitting: Physical Therapy

## 2021-07-13 DIAGNOSIS — R2681 Unsteadiness on feet: Secondary | ICD-10-CM

## 2021-07-13 DIAGNOSIS — R42 Dizziness and giddiness: Secondary | ICD-10-CM | POA: Diagnosis not present

## 2021-07-13 DIAGNOSIS — R2689 Other abnormalities of gait and mobility: Secondary | ICD-10-CM | POA: Diagnosis not present

## 2021-07-13 DIAGNOSIS — M6281 Muscle weakness (generalized): Secondary | ICD-10-CM

## 2021-07-13 NOTE — Therapy (Addendum)
[facility] [address] [place], [place], [address] Phone: [phone]   Fax:  [phone]  Physical Therapy Treatment/Progress Note  Patient Details  Name: Wesley [name] MRN: [id number] Date of Birth: 1941-06-08 Referring Provider (PT): Marin Olp, MD  Physical Therapy Progress Note   Dates of Reporting Period: 05/02/21 - 07/15/22  See Note below for Objective Data and Assessment of Progress/Goals.  Thank you for the referral of this patient. Guillermina [place], PT, DPT  Encounter Date: 07/13/2021   PT End of Session - 07/13/21 1425     Visit Number 10    Number of Visits 14    Date for PT Re-Evaluation 07/22/21    Authorization Type Medicare A+B/[employer]    Progress Note Due on Visit 20   PT Start Time 1316    PT Stop Time 1400    PT Time Calculation (min) 44 min    Equipment Utilized During Treatment Gait belt    Activity Tolerance Patient tolerated treatment well    Behavior During Therapy WFL for tasks assessed/performed             Past Medical History:  Diagnosis Date   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 symptoms (LUTS)    CAD (coronary artery disease)    Cholelithiasis    Chronic allergic rhinitis    Coronary atherosclerosis of native coronary artery    Fatty liver    GERD (gastroesophageal reflux disease)    HX 52yrs ago, no longer a problem, no meds   High blood pressure    Hypercholesterolemia    Macrocytosis without anemia    OSA on CPAP    Paraesophageal hernia    Partial bowel obstruction (HCC)    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 PT denies  significant findings   CARDIAC CATHETERIZATION  2008   COLOSTOMY N/A 10/31/2019   Procedure: COLOSTOMY;  Surgeon: Erroll Luna, MD;  Location: [facility];  Service: General;  Laterality: N/A;   CYSTOSCOPY W/ URETERAL STENT PLACEMENT Bilateral 10/31/2019   Procedure: CYSTOSCOPY URETERAL STENT PLACEMENT BILATERAL;  Surgeon: Franchot Gallo, MD;  Location: [facility];  Service: Urology;  Laterality: Bilateral;   CYSTOSCOPY WITH STENT PLACEMENT Bilateral 03/11/2020   Procedure: CYSTOSCOPY WITH BILATERAL FIREFLY INJECTION;  Surgeon: Irine Seal, MD;  Location: [facility];  Service: Urology;  Laterality: Bilateral;   EYE SURGERY     BILATERAL CATARACT SURGERY WITH LENS IMPLANTS   FLEXIBLE SIGMOIDOSCOPY N/A 10/29/2019   Procedure: FLEXIBLE SIGMOIDOSCOPY;  Surgeon: Jerene Bears, MD;  [facility];  Service: Gastroenterology;  Laterality: N/A;   INCISIONAL HERNIA REPAIR N/A 10/13/2020   Procedure: OPEN INCISIONAL HERNIA REPAIR WITH MESH;  Surgeon: [name], MD;  Location: [facility];  Service: General;  Laterality: N/A;   INSERTION OF MESH N/A 09/08/2016   Procedure: INSERTION OF MESH;  Surgeon: Jackolyn Confer, MD;  Location: [facility];  Service: General;  Laterality: N/A;   IR RADIOLOGIST EVAL & MGMT  11/10/2020   IR RADIOLOGIST EVAL & MGMT  11/25/2020   LAPAROSCOPIC SIGMOID COLECTOMY N/A 10/31/2019   Procedure: DIAGNOSTIC LAPAROSCOPY; EXPLORATORY LAPAROTOMY; SIGMOID COLECTOMY;  Surgeon: Erroll Luna, MD;  Location: [facility];  Service: General;  Laterality: N/A;   right rotator cuff     SUBMUCOSAL TATTOO INJECTION  10/29/2019   Procedure: SUBMUCOSAL TATTOO INJECTION;  Surgeon: Jerene Bears, MD;  [facility];  Service: Gastroenterology;;   TONSILLECTOMY     UMBILICAL HERNIA REPAIR N/A 09/08/2016   Procedure: OPEN UMBILICAL HERNIA REPAIR WITH MESH;  Surgeon: Jackolyn Confer, MD;  [facility];  Service: General;  Laterality: N/A;   VENTRAL HERNIA REPAIR N/A 02/03/2021   Procedure:  LAPAROSCOPIC VENTRAL HERNIA REPAIR WITH MESH;  Surgeon: [name], MD;  Location: [facility];  Service: General;  Laterality: N/A;   XI ROBOTIC ASSISTED COLOSTOMY TAKEDOWN N/A 03/11/2020   Procedure: ROBOTIC ASSISTED COLOSTOMY REVERSAL, RIGID PROCTOSCOPY;  Surgeon: Leighton Ruff, MD;  [facility];  Service: General;  Laterality: N/A;    There were no vitals filed for this visit.   Subjective Assessment - 07/13/21 1320     Subjective No new complaints, falls, or pain. 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 No/denies    Pain Score 0-No pain               [facility] Adult PT Treatment/Exercise - 07/13/21 1322       Transfers   Transfers Sit to Stand;Stand to Sit    Sit to Stand 5: Supervision;With upper extremity assist;Without upper extremity assist;From bed;From chair/3-in-1    Stand to Sit 5: Supervision;With upper extremity assist;Without upper extremity assist;To bed;To chair/3-in-1      Ambulation/Gait   Ambulation/Gait Yes    Ambulation/Gait Assistance 5: Supervision    Ambulation Distance (Feet) --   Throughout session   Assistive device None    Gait Pattern Step-through pattern;Decreased step length - right;Decreased step length - left;Wide base of support    Ambulation Surface Level;Indoor      Exercises   Exercises Knee/Hip      Knee/Hip Exercises: Aerobic   Other Aerobic Completed SciFit to continue focus on LE strengthening/endurance training: completed on Level 3 x 6 min.             Vestibular Treatment/Exercise - 07/13/21 1339       Vestibular Treatment/Exercise   Vestibular Treatment Provided Gaze     Gaze Exercises X1 Viewing Horizontal;X1 Viewing Vertical      X1 Viewing Horizontal   Foot Position standing bil stance on airex; narrow BOS on airex    Reps 2    Comments x 60 seconds with feet wide and x 60 secondes with narrow BOS, require UE support today due to unsteadiness  on airex.      X1 Viewing Vertical   Foot Position standing bil stance on airex; narrow BOS on airex    Reps 2    Comments x 60 seconds with feet wide, and x 60 seconds with narrow BOS, require UE support today due to unsteadiness  on airex                Balance Exercises - 07/13/21 1356       Balance Exercises: Standing   Rockerboard  Anterior/posterior;Lateral;EC;Other time (comment);Other reps (comment);UE support;Limitations    Rockerboard Limitations standing on rockerboard positioned A/P and lateral in // bars: rocking board in both directions with EC x 15 each, then focusing on holding board steady 2 x 30 each direction with EC. The pt required single UE support and showed hesitancy to decrease UE support.               PT Education - 07/13/21 1424     Education Details added standing on pillows/cushion in corner to gaze stabilzation exercises.    Person(s) Educated Patient    Methods Explanation;Handout    Comprehension Verbalized understanding              PT Short Term Goals - 06/16/21 1419       PT SHORT TERM GOAL #1   Title = LTGs               PT Long Term Goals - 06/16/21 1419       PT LONG TERM GOAL #1   Title Pt to be independent wtih final HEPand reports walking daily >/= 25 minutes(ALL LTGs Due: 07/22/21)    Baseline no HEP established; reports independence will benefit from progressive HEP    Time 4    Period Weeks    Status Revised    Target Date 07/22/21      PT LONG TERM GOAL #2   Title Patient will improve FGA to >/= 25/30    Baseline TBA; 23/30    Time 4    Period Weeks    Status Revised      PT LONG TERM GOAL #3   Title Patient will be  able to hold situation 4 of M-CTSIB for >/= 15 seconds to demo improved vestibular input for balance    Baseline unable to complete; 5 seconds    Time 4    Period Weeks    Status On-going      PT LONG TERM GOAL #4   Title --    Baseline --    Time --    Period --    Status --      PT LONG TERM GOAL #5   Title Patient will be able to ambulate >/= 800 ft outdoors unlevel surfaces without AD and Mod I for improved ability for community mobility    Baseline using SPC outdoors at times; ambulated 350 ft outdoors no AD, supervision    Time 4    Period Weeks    Status Revised      PT LONG TERM GOAL #6   Title Patient will improve DFS >/= 59    Baseline 49    Time 4    Period Weeks    Status On-going              Plan - 07/13/21 1426     Clinical Impression Statement Today's skilled session was focused on endurance/LE strengthening with SciFit and performing gaze stabilization/balance exercises on compliant surfaces. The pt completed all interventions with no issues noted and rest breaks as needed. The pt could continue to benefit from further skilled PT to address functional deficits.    Personal Factors and Comorbidities Comorbidity 3+;Age    Comorbidities Skin Cancer, CAD, GERD, Sleep Apnea, HTN, HLD, Vestibular Schwannoma    Examination-Activity Limitations Bed Mobility;Bend;Caring for Others;Stairs    Examination-Participation Restrictions [employer] Activity    Stability/Clinical Decision Making Stable/Uncomplicated    Rehab Potential Good    PT Frequency 2x /  week    PT Duration 4 weeks    PT Treatment/Interventions ADLs/Self Care Home Management;Aquatic Therapy;Canalith Repostioning;Cryotherapy;Moist Heat;DME Instruction;Electrical Stimulation;Gait training;Stair training;Functional mobility training;Therapeutic activities;Therapeutic exercise;Balance training;Neuromuscular re-education;Patient/family education;Vestibular;Dry needling;Manual techniques;Passive  range of motion;Joint Manipulations    PT Next Visit Plan focus on balance on compliant surfaces (try having pt performing tasks with UE to promote decreased UE reliance due to pt hesitancy to let go); SLS/tandem activities, stepping strategies, Eyes Closed, Backwards walking. Progress VOR    PT Home Exercise Plan [employer] [email or web address]    Consulted and Agree with Plan of Care Patient                 Patient will benefit from skilled therapeutic intervention in order to improve the following deficits and impairments:  Difficulty walking, Abnormal gait, Decreased balance, Decreased endurance, Decreased strength, Decreased knowledge of use of DME, Decreased activity tolerance, Dizziness, Decreased range of motion  Visit Diagnosis: Unsteadiness on feet  Other abnormalities of gait and mobility  Muscle weakness (generalized)  Dizziness and giddiness     Problem List Patient Active Problem List   Diagnosis Date Noted   Sleep apnea 05/07/2021   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   B12 deficiency 01/06/2021   Vitamin D deficiency 01/06/2021   Aortic atherosclerosis ([place]) 01/06/2021   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 [place], [place], [address] Phone: [phone]   Fax:  [phone]  Name: Wesley [name] MRN: [id number] Date of Birth: [birth year]  This note has been reviewed and edited by supervising CI.   Willow Ora, PTA, [facility] [address], [place], [place] [address] [phone] 07/13/21, 3:22 PM

## 2021-07-15 NOTE — Progress Notes (Signed)
Phone [phone] In person visit   Subjective:   Wesley [name] is a 80 y.o. year old very pleasant male patient who presents for/with See problem oriented charting Chief Complaint  Patient presents with   Follow-up    Pt been in PT for 3 months and finished as of yesterday;     This visit occurred during the SARS-CoV-2 public health emergency.  Safety protocols were in place, including screening questions prior to the visit, additional usage of staff PPE, and extensive cleaning of exam room while observing appropriate contact time as indicated for disinfecting solutions.   Past Medical History-  Patient Active Problem List   Diagnosis Date Noted   S/P hernia repair 10/13/2020    Priority: High   CAD (coronary artery disease) 08/12/2013    Priority: High   B12 deficiency 01/06/2021    Priority: Medium    Vitamin D deficiency 01/06/2021    Priority: Medium    Aortic atherosclerosis ([place]) 01/06/2021    Priority: Medium    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[place]) 01/06/2021    Priority: Medium    BPH associated with nocturia 01/06/2021    Priority: Medium    Abdominal wall abscess 10/28/2020    Priority: Medium    Essential hypertension 03/15/2011    Priority: Medium    Allergic rhinitis 01/06/2021    Priority: Low   Osteoarthritis of glenohumeral joint, left 09/21/2017    Priority: Low   Non-traumatic rotator cuff tear 09/21/2017    Priority: Low   GERD (gastroesophageal reflux disease) 03/15/2011    Priority: Low   Sleep apnea 05/07/2021   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 Swallow whole.     atorvastatin (LIPITOR) 80 MG tablet TAKE 1 TABLET EVERY DAY 90 tablet 2   Cholecalciferol (VITAMIN D3) 25 MCG (1000 UT) CAPS Take 1,000 Units by mouth daily.     finasteride (PROSCAR) 5 MG tablet Take 5 mg by mouth  daily.     lisinopril (ZESTRIL) 10 MG tablet Take 10 mg by mouth daily.     loratadine (CLARITIN) 10 MG tablet Take 10 mg by mouth daily.     magnesium oxide (MAG-OX) 400 MG tablet Take 400 mg by mouth daily.     meclizine (ANTIVERT) 25 MG tablet Take 1 tablet (25 mg total) by mouth 3 (three) times daily as needed for up to 15 doses for dizziness. 15 tablet 0   Omega-3 Fatty Acids (FISH OIL) 1200 MG CAPS Take 1,200 mg by mouth daily.     No current facility-administered medications for this visit.     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 Lisbeth Renshaw of interventional radiology I believe- hard to see notes  -Dr. Redmond Baseman recommended repeat MRI in February. Hearing test with some hearing loss- not sure if schwannoma related or not- will likely have repeat test - We also referred to vestibular rehab in case this was BPPV.  We also discussed prior remote cerebellar infarcts but did not think this would cause acute balance issues -Patient finished with physical therapy yesterday-  -Patient reports feeling significantly better overall  - was more sedentary due to BPPV and  imbalance and taking time to get strength back fully. Plus this dates back further to  prior abdominal wall abscess and surgery after hrenia repair in march 2022.   #hypertension-whitecoat element at times S: medication: Lisinopril 10 mg daily Home readings #s: also well controlled at home BP Readings from Last 3 Encounters:  07/22/21 128/78  04/19/21 (!) 141/79  04/11/21 134/88  A/P:  Controlled. Continue current medications.    #CAD #hyperlipidemia/aortic atherosclerosis S: Medication:Atorvastatin 80 mg daily , fish oil, aspirin 81mg  daily -Denies chest pain or shortness of breath Lab Results  Component Value Date   CHOL 107 01/17/2021   HDL 41.90 01/17/2021   LDLCALC 52 01/17/2021   TRIG 64.0 01/17/2021   CHOLHDL 3 01/17/2021   A/P: Cholesterol has been at goal under 70 for LDL with CAD and aortic atherosclerosis history.  CAD is asymptomatic-continue statin and aspirin-follow  # Depression S: Medication:wellbutrin 300mg  XR for at least 5 years (now discontinued)  A/P: Denies anhedonia or depressed mood-continue without medication and monitor  #BPH- rx from Dr. Jeffie Pollock #also sees him for nephrolithiasis S: Medication: Finasteride 5 mg daily A/P: No change in urinary symptoms-continue current medication  #Vitamin D deficiency S: Medication: 1000 units per day Last vitamin D Lab Results  Component Value Date   VD25OH 42.24 01/17/2021  A/P: Has been well controlled-continue daily supplement  # B12 deficiency S: Current treatment/medication (oral vs. IM):  3000 mcg per day in the past- he has stopped Lab Results  Component Value Date   VITAMINB12 >1550 (H) 01/17/2021  A/P: Well-controlled on last check-discussed rechecking with next labs  #OSA on CPAP- Compliant with CPAP  # allergies- claritin prn effective   #macrocytosis- despite b12 replacement  Lab Results  Component Value Date   WBC 5.1 04/08/2021   HGB 14.3 04/08/2021   HCT 43.6 04/08/2021   MCV 103.8 (H)  04/08/2021   PLT 168 04/08/2021   # fatty liver-  -strongly encouraged limiting alcohol intake- 2 a day right now so has cut back- but ideally would even eliminate with fatty liver and to help with weight management. Some gain since thanksgiving Lab Results  Component Value Date   ALT 34 04/08/2021   AST 35 04/08/2021   ALKPHOS 70 04/08/2021   BILITOT 0.9 04/08/2021    Health Maintenance Due  Topic Date Due   Zoster Vaccines- Shingrix (2 of 2)- plans to get this 06/01/2020   COVID-19 Vaccine (5 - Booster for [employer] series)- team will log date 02/07/2021   Recommended follow up: No follow-ups on file. Future Appointments  Date Time Provider [place]  10/10/2021 11:30 AM Vaslow, Acey Lav, MD [facility] None   Lab/Order associations:   ICD-10-CM  1. Essential hypertension  I10     2. Hyperlipidemia, unspecified hyperlipidemia type  E78.5     3. Coronary artery disease involving native heart without angina pectoris, unspecified vessel or lesion type  I25.10     4. Major depressive disorder with single episode, in full remission ([place])  F32.5     5. Aortic atherosclerosis (HCC)  I70.0     6. S/P hernia repair  Z98.890    Z87.19      No orders of the defined types were placed in this encounter.  I,Jada Bradford,acting as a scribe for Garret Reddish, MD.,have documented all relevant documentation on the behalf of Garret Reddish, MD,as directed by  Garret Reddish, MD while in the presence of Garret Reddish, MD.  I, Garret Reddish, MD, have reviewed all documentation for this visit. The documentation on 07/22/21 for the exam, diagnosis, procedures, and orders are all accurate and complete.  Return precautions advised.  Garret Reddish, MD

## 2021-07-19 ENCOUNTER — Ambulatory Visit: Payer: Medicare Other | Attending: Family Medicine

## 2021-07-19 ENCOUNTER — Other Ambulatory Visit: Payer: Self-pay

## 2021-07-19 DIAGNOSIS — R42 Dizziness and giddiness: Secondary | ICD-10-CM | POA: Insufficient documentation

## 2021-07-19 DIAGNOSIS — M6281 Muscle weakness (generalized): Secondary | ICD-10-CM | POA: Insufficient documentation

## 2021-07-19 DIAGNOSIS — R2689 Other abnormalities of gait and mobility: Secondary | ICD-10-CM | POA: Diagnosis not present

## 2021-07-19 DIAGNOSIS — R2681 Unsteadiness on feet: Secondary | ICD-10-CM | POA: Insufficient documentation

## 2021-07-19 NOTE — Patient Instructions (Signed)
Access Code: [email or web address] URL: [email or web address] Date: 07/19/2021 Prepared by: Baldomero Lamy  Exercises Sit to Stand Without Arm Support - 1 x daily - 7 x weekly - 1 sets - 10 reps Romberg Stance with Head Nods + Head Turns - 1 x daily - 7 x weekly - 3 sets - 10 reps Romberg Stance with Eyes Closed - 1 x daily - 5 x weekly - 1 sets - 3 reps - 30 seconds hold Standing Romberg to 1/4 Tandem Stance - 1 x daily - 7 x weekly - 1 sets - 2 reps - 30 hold Standing Gaze Stabilization with Head Rotation - 1 x daily - 5 x weekly - 1 sets - 2 reps Standing Gaze Stabilization with Head Nod - 1 x daily - 5 x weekly - 1 sets - 2 reps Walking with Head Rotation with Countertop Support - 1 x daily - 5 x weekly - 1 sets - 4 reps

## 2021-07-19 NOTE — Therapy (Signed)
[facility] [address] [place], [place], [address] Phone: [phone]   Fax:  [phone]  Physical Therapy Treatment  Patient Details  Name: Wesley [name] MRN: [id number] Date of Birth: [birth year] Referring Provider (PT): Marin Olp, MD   Encounter Date: 07/19/2021   PT End of Session - 07/19/21 1104     Visit Number 11    Number of Visits 14    Date for PT Re-Evaluation 07/22/21    Authorization Type Medicare A+B/AARP    Progress Note Due on Visit 20    PT Start Time 1100    PT Stop Time 1141    PT Time Calculation (min) 41 min    Equipment Utilized During Treatment Gait belt    Activity Tolerance Patient tolerated treatment well    Behavior During Therapy WFL for tasks assessed/performed             Past Medical History:  Diagnosis Date   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 symptoms (LUTS)    CAD (coronary artery disease)    Cholelithiasis    Chronic allergic rhinitis    Coronary atherosclerosis of native coronary artery    Fatty liver    GERD (gastroesophageal reflux disease)    HX 73yr ago, no longer a problem, no meds   High blood pressure    Hypercholesterolemia    Macrocytosis without anemia    OSA on CPAP    Paraesophageal hernia    [place]    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 PT denies significant findings   CARDIAC CATHETERIZATION  2008   COLOSTOMY N/A 10/31/2019   Procedure: COLOSTOMY;  Surgeon: CErroll Luna MD;  [facility];  Service: General;  Laterality: N/A;   CYSTOSCOPY W/ URETERAL STENT PLACEMENT  Bilateral 10/31/2019   Procedure: CYSTOSCOPY URETERAL STENT PLACEMENT BILATERAL;  Surgeon: DFranchot Gallo MD;  [facility];  Service: Urology;  Laterality: Bilateral;   CYSTOSCOPY WITH STENT PLACEMENT Bilateral 03/11/2020   Procedure: CYSTOSCOPY WITH BILATERAL FIREFLY INJECTION;  Surgeon: WIrine Seal MD;  [facility];  Service: Urology;  Laterality: Bilateral;   EYE SURGERY     BILATERAL CATARACT SURGERY WITH LENS IMPLANTS   FLEXIBLE SIGMOIDOSCOPY N/A 10/29/2019   Procedure: FLEXIBLE SIGMOIDOSCOPY;  Surgeon: PJerene Bears MD;  [facility];  Service: Gastroenterology;  Laterality: N/A;   INCISIONAL HERNIA REPAIR N/A 10/13/2020   Procedure: OPEN INCISIONAL HERNIA REPAIR WITH MESH;  Surgeon: RRalene Ok MD;  [facility]  Service: General;  Laterality: N/A;   INSERTION OF MESH N/A 09/08/2016   Procedure: INSERTION OF MESH;  Surgeon: TJackolyn Confer MD;  [facility];  Service: General;  Laterality: N/A;   IR RADIOLOGIST EVAL & MGMT  11/10/2020   IR RADIOLOGIST EVAL & MGMT  11/25/2020   LAPAROSCOPIC SIGMOID COLECTOMY N/A 10/31/2019   Procedure: DIAGNOSTIC LAPAROSCOPY; EXPLORATORY LAPAROTOMY; SIGMOID COLECTOMY;  Surgeon: CErroll Luna MD;  [facility];  Service: General;  Laterality: N/A;   right rotator cuff     SUBMUCOSAL TATTOO INJECTION  10/29/2019   Procedure: SUBMUCOSAL TATTOO INJECTION;  Surgeon: PHilarie Fredrickson  Lajuan Lines, MD;  Location: [facility];  Service: Gastroenterology;;   TONSILLECTOMY     UMBILICAL HERNIA REPAIR N/A 09/08/2016   Procedure: OPEN UMBILICAL HERNIA REPAIR WITH MESH;  Surgeon: Jackolyn Confer, MD;  [facility];  Service: General;  Laterality: N/A;   VENTRAL HERNIA REPAIR N/A 02/03/2021   Procedure: LAPAROSCOPIC VENTRAL HERNIA REPAIR WITH MESH;  Surgeon: [name], MD;  Location: [facility];  Service: General;  Laterality: N/A;   XI ROBOTIC ASSISTED COLOSTOMY TAKEDOWN N/A 03/11/2020   Procedure: ROBOTIC ASSISTED COLOSTOMY REVERSAL,  RIGID PROCTOSCOPY;  Surgeon: Leighton Ruff, MD;  [facility];  Service: General;  Laterality: N/A;    There were no vitals filed for this visit.   Subjective Assessment - 07/19/21 1102     Subjective Patient denies new changes/complaints. Reports some more stiffness this morning due to the arthritis. No falls.    Patient is accompained by: Family member   Wife   Pertinent History Skin Cancer, CAD, GERD, Sleep Apnea, HTN, HLD, Vestibular Schwannoma    Limitations Standing;Walking;House hold activities    Diagnostic tests vestibular schwannoma in the right IAC    Patient Stated Goals getting in his boat; be able to walk dogs    Currently in Pain? No/denies                [facility] PT Assessment - 07/19/21 0001       Functional Gait  Assessment   Gait assessed  Yes    Gait Level Surface Walks 20 ft in less than 7 sec but greater than 5.5 sec, uses assistive device, slower speed, mild gait deviations, or deviates 6-10 in outside of the 12 in walkway width.    Change in Gait Speed Able to smoothly change walking speed without loss of balance or gait deviation. Deviate no more than 6 in outside of the 12 in walkway width.    Gait with Horizontal Head Turns Performs head turns smoothly with slight change in gait velocity (eg, minor disruption to smooth gait path), deviates 6-10 in outside 12 in walkway width, or uses an assistive device.    Gait with Vertical Head Turns Performs head turns with no change in gait. Deviates no more than 6 in outside 12 in walkway width.    Gait and Pivot Turn Pivot turns safely within 3 sec and stops quickly with no loss of balance.    Step Over Obstacle Is able to step over 2 stacked shoe boxes taped together (9 in total height) without changing gait speed. No evidence of imbalance.    Gait with Narrow Base of Support Ambulates 4-7 steps.    Gait with Eyes Closed Walks 20 ft, uses assistive device, slower speed, mild gait deviations, deviates 6-10 in  outside 12 in walkway width. Ambulates 20 ft in less than 9 sec but greater than 7 sec.    Ambulating Backwards Walks 20 ft, no assistive devices, good speed, no evidence for imbalance, normal gait    Steps Alternating feet, must use rail.    Total Score 24    FGA comment: 24/30             Completed entire review and completion of all the following exercises. New handout provided. Patient tolerating all activities well.   Access Code: [email or web address] URL: [email or web address] Date: 07/19/2021 Prepared by: Baldomero Lamy  Exercises Sit to Stand Without Arm Support - 1 x daily - 7 x weekly - 1 sets - 10 reps Romberg Stance with Head  Nods + Head Turns - 1 x daily - 7 x weekly - 3 sets - 10 reps Romberg Stance with Eyes Closed - 1 x daily - 5 x weekly - 1 sets - 3 reps - 30 seconds hold Standing Romberg to 1/4 Tandem Stance - 1 x daily - 7 x weekly - 1 sets - 2 reps - 30 hold Standing Gaze Stabilization with Head Rotation - 1 x daily - 5 x weekly - 1 sets - 2 reps Standing Gaze Stabilization with Head Nod - 1 x daily - 5 x weekly - 1 sets - 2 reps Walking with Head Rotation with Countertop Support - 1 x daily - 5 x weekly - 1 sets - 4 reps         PT Education - 07/19/21 1141     Education Details Updated HEP; Progress toward LTG    Person(s) Educated Patient    Methods Explanation;Demonstration;Handout;Verbal cues    Comprehension Verbalized understanding;Returned demonstration              PT Short Term Goals - 06/16/21 1419       PT SHORT TERM GOAL #1   Title = LTGs               PT Long Term Goals - 07/19/21 1117       PT LONG TERM GOAL #1   Title Pt to be independent wtih final HEPand reports walking daily >/= 25 minutes(ALL LTGs Due: 07/22/21)    Baseline no HEP established; reports independence will benefit from progressive HEP; independent with progress HEP, only walking approx 10 minutes daily    Time 4    Period Weeks    Status  Partially Met    Target Date 07/22/21      PT LONG TERM GOAL #2   Title Patient will improve FGA to >/= 25/30    Baseline TBA; 23/30; 24/30 (progress toward goal, not to goal level)    Time 4    Period Weeks    Status Partially Met      PT LONG TERM GOAL #3   Title Patient will be able to hold situation 4 of M-CTSIB for >/= 15 seconds to demo improved vestibular input for balance    Baseline unable to complete; 5 seconds    Time 4    Period Weeks    Status On-going      PT LONG TERM GOAL #5   Title Patient will be able to ambulate >/= 800 ft outdoors unlevel surfaces without AD and Mod I for improved ability for community mobility    Baseline using SPC outdoors at times; ambulated 350 ft outdoors no AD, supervision    Time 4    Period Weeks    Status Revised      PT LONG TERM GOAL #6   Title Patient will improve DFS >/= 59    Baseline 49    Time 4    Period Weeks    Status On-going                   Plan - 07/19/21 1142     Clinical Impression Statement Today's skilled PT session included begin assesment of patient's progress toward LTG. Patient able to partially meet LTG #1 and #2. Completed full review of HEP and updated to patient's tolerance, New handout provided. Extensive education focused on engagement in endurance/activity tolerance via walking or bike. Will continue to progress toward all LTGs.  Personal Factors and Comorbidities Comorbidity 3+;Age    Comorbidities Skin Cancer, CAD, GERD, Sleep Apnea, HTN, HLD, Vestibular Schwannoma    Examination-Activity Limitations Bed Mobility;Bend;Caring for Others;Stairs    Examination-Participation Restrictions [employer] Activity    Stability/Clinical Decision Making Stable/Uncomplicated    Rehab Potential Good    PT Frequency 2x / week    PT Duration 4 weeks    PT Treatment/Interventions ADLs/Self Care Home Management;Aquatic Therapy;Canalith Repostioning;Cryotherapy;Moist Heat;DME  Instruction;Electrical Stimulation;Gait training;Stair training;Functional mobility training;Therapeutic activities;Therapeutic exercise;Balance training;Neuromuscular re-education;Patient/family education;Vestibular;Dry needling;Manual techniques;Passive range of motion;Joint Manipulations    PT Next Visit Plan Finish check LTGs and D/C. review HEP if needed.    PT [place]    Consulted and Agree with Plan of Care Patient             Patient will benefit from skilled therapeutic intervention in order to improve the following deficits and impairments:  Difficulty walking, Abnormal gait, Decreased balance, Decreased endurance, Decreased strength, Decreased knowledge of use of DME, Decreased activity tolerance, Dizziness, Decreased range of motion  Visit Diagnosis: Unsteadiness on feet  Other abnormalities of gait and mobility  Muscle weakness (generalized)  Dizziness and giddiness     Problem List Patient Active Problem List   Diagnosis Date Noted   Sleep apnea 05/07/2021   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   B12 deficiency 01/06/2021   Vitamin D deficiency 01/06/2021   Aortic atherosclerosis ([place]) 01/06/2021   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 [place], [place], [address] Phone: [phone]   Fax:  [phone]  Name: MANOLO BOSKET MRN: [id number] Date of Birth: [birth year]

## 2021-07-21 ENCOUNTER — Ambulatory Visit: Payer: Medicare Other | Admitting: Physical Therapy

## 2021-07-21 ENCOUNTER — Other Ambulatory Visit: Payer: Self-pay | Admitting: Urology

## 2021-07-21 ENCOUNTER — Encounter: Payer: Self-pay | Admitting: Physical Therapy

## 2021-07-21 ENCOUNTER — Other Ambulatory Visit: Payer: Self-pay

## 2021-07-21 DIAGNOSIS — R42 Dizziness and giddiness: Secondary | ICD-10-CM | POA: Diagnosis not present

## 2021-07-21 DIAGNOSIS — R2689 Other abnormalities of gait and mobility: Secondary | ICD-10-CM | POA: Diagnosis not present

## 2021-07-21 DIAGNOSIS — M6281 Muscle weakness (generalized): Secondary | ICD-10-CM

## 2021-07-21 DIAGNOSIS — R2681 Unsteadiness on feet: Secondary | ICD-10-CM

## 2021-07-21 NOTE — Therapy (Addendum)
[facility] [address] [place], [place], [address] Phone: [phone]   Fax:  [phone]  Physical Therapy Treatment/Discharge Summary  Patient Details  Name: Wesley [name] MRN: [id number] Date of Birth: [birth year] Referring Provider (PT): Marin Olp, MD  PHYSICAL THERAPY DISCHARGE SUMMARY  Visits from Start of Care: 12  Current functional level related to goals / functional outcomes: See Clinical Impression Statement   Remaining deficits: Mild Imbalance   Education / Equipment: HEP/Walking Program   Patient agrees to discharge. Patient goals were partially met. Patient is being discharged due to meeting the stated rehab goals.  Encounter Date: 07/21/2021   PT End of Session - 07/21/21 1321     Visit Number 12    Number of Visits 14    Date for PT Re-Evaluation 07/22/21    Authorization Type Medicare A+B/AARP    Progress Note Due on Visit 81    PT Start Time 1318    PT Stop Time 1400    PT Time Calculation (min) 42 min    Equipment Utilized During Treatment Gait belt    Activity Tolerance Patient tolerated treatment well    Behavior During Therapy WFL for tasks assessed/performed             Past Medical History:  Diagnosis Date   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 symptoms (LUTS)    CAD (coronary artery disease)    Cholelithiasis    Chronic allergic rhinitis    Coronary atherosclerosis of native coronary artery    Fatty liver    GERD (gastroesophageal reflux disease)    HX 63yr ago, no longer a problem, no meds   High blood pressure    Hypercholesterolemia    Macrocytosis without anemia    OSA on CPAP    Paraesophageal hernia    Partial bowel obstruction ([facility])    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 PT denies significant findings   CARDIAC CATHETERIZATION  2008   COLOSTOMY N/A 10/31/2019   Procedure: COLOSTOMY;  Surgeon: CErroll Luna MD;  [facility];  Service: General;  Laterality: N/A;   CYSTOSCOPY W/ URETERAL STENT PLACEMENT Bilateral 10/31/2019   Procedure: CYSTOSCOPY URETERAL STENT PLACEMENT BILATERAL;  Surgeon: DFranchot Gallo MD;  [facility];  Service: Urology;  Laterality: Bilateral;   CYSTOSCOPY WITH STENT PLACEMENT Bilateral 03/11/2020   Procedure: CYSTOSCOPY WITH BILATERAL FIREFLY INJECTION;  Surgeon: WIrine Seal MD;  [facility];  Service: Urology;  Laterality: Bilateral;   EYE SURGERY     BILATERAL CATARACT SURGERY WITH LENS IMPLANTS   FLEXIBLE SIGMOIDOSCOPY N/A 10/29/2019   Procedure: FLEXIBLE SIGMOIDOSCOPY;  Surgeon: PJerene Bears MD;  [facility];  Service: Gastroenterology;  Laterality: N/A;   INCISIONAL HERNIA REPAIR N/A 10/13/2020   Procedure: OPEN INCISIONAL HERNIA REPAIR WITH MESH;  Surgeon: RRalene Ok MD;  [facility]  Service: General;  Laterality: N/A;   INSERTION OF MESH N/A 09/08/2016   Procedure: INSERTION OF MESH;  Surgeon: TJackolyn Confer MD;  [facility];  Service: General;  Laterality: N/A;   IR RADIOLOGIST EVAL & MGMT  11/10/2020  IR RADIOLOGIST EVAL & MGMT  11/25/2020   LAPAROSCOPIC SIGMOID COLECTOMY N/A 10/31/2019   Procedure: DIAGNOSTIC LAPAROSCOPY; EXPLORATORY LAPAROTOMY; SIGMOID COLECTOMY;  Surgeon: Erroll Luna, MD;  [facility];  Service: General;  Laterality: N/A;   right rotator cuff     SUBMUCOSAL TATTOO INJECTION  10/29/2019   Procedure: SUBMUCOSAL TATTOO INJECTION;  Surgeon: Jerene Bears, MD;  [facility];  Service: Gastroenterology;;   TONSILLECTOMY     UMBILICAL HERNIA REPAIR N/A 09/08/2016   Procedure: OPEN  UMBILICAL HERNIA REPAIR WITH MESH;  Surgeon: Jackolyn Confer, MD;  [facility];  Service: General;  Laterality: N/A;   VENTRAL HERNIA REPAIR N/A 02/03/2021   Procedure: LAPAROSCOPIC VENTRAL HERNIA REPAIR WITH MESH;  Surgeon: [name], MD;  Location: [facility];  Service: General;  Laterality: N/A;   XI ROBOTIC ASSISTED COLOSTOMY TAKEDOWN N/A 03/11/2020   Procedure: ROBOTIC ASSISTED COLOSTOMY REVERSAL, RIGID PROCTOSCOPY;  Surgeon: Leighton Ruff, MD;  [facility];  Service: General;  Laterality: N/A;    There were no vitals filed for this visit.   Subjective Assessment - 07/21/21 1320     Subjective No new complaints. No falls.    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 No/denies    Pain Score 0-No pain                     [facility] Adult PT Treatment/Exercise - 07/21/21 1324       Transfers   Transfers Sit to Stand;Stand to Sit    Sit to Stand 6: Modified independent (Device/Increase time)    Stand to Sit 6: Modified independent (Device/Increase time)      Ambulation/Gait   Ambulation/Gait Yes    Ambulation/Gait Assistance 6: Modified independent (Device/Increase time)    Ambulation/Gait Assistance Details minor veering with antalgic pattern noted, no loss of balance noted.    Ambulation Distance (Feet) 830 Feet   x1 in/outdoors, plus around clinic   Assistive device None    Gait Pattern Step-through pattern;Decreased step length - right;Decreased step length - left;Wide base of support    Ambulation Surface Level;Unlevel;Indoor;Outdoor;Paved;Grass      Self-Care   Self-Care Other Self-Care Comments    Other Self-Care Comments  administered FOTO survey with pt scoring FS Primary Measure 541 (improved from 49 at  intake) and DPS score of 58, no changed from intial score of 58.                 Balance Exercises - 07/21/21 1336       Balance Exercises: Standing   Standing Eyes Closed Narrow base of support (BOS);Wide (BOA);Head turns;Foam/compliant surface;Other reps (comment);30 secs;Limitations    Standing Eyes Closed Limitations on airex with light fingertip to brief periods of no UE support: feet together for EC 30 sec's x 3 reps, progressing to feet apart for EC head movements left<>right, up<>down for ~10 reps each. min guard to min assist for balance with cues on posture and weight shifting to assist with balance.    Partial Tandem Stance Foam/compliant surface;3 reps;30 secs;Limitations    Partial Tandem Stance Limitations on airex with light fingertip support for 3 reps each foot forward with min guard to min assist, cues on posture/weight shifting to  assist with balance.    Sit to Stand Standard surface;Foam/compliant surface;Limitations    Sit to Stand Limitations feet in parallel position for 10 reps, then feet in saggered position for 10 reps each foot forward with light to no UE assist, cues for full standing and slow descent with sitting down. min guard assist for safety.                  PT Short Term Goals - 06/16/21 1419       PT SHORT TERM GOAL #1   Title = LTGs              PT Long Term Goals - 07/21/21 1602       PT LONG TERM GOAL #1   Title Pt to be independent wtih final HEPand reports walking daily >/= 25 minutes(ALL LTGs Due: 07/22/21)    Baseline 07/19/21: independent with progress HEP, only walking approx 10 minutes daily    Status Partially Met      PT LONG TERM GOAL #2   Title Patient will improve FGA to >/= 25/30    Baseline 07/19/21:  24/30 (progress toward goal, not to goal level)    Status Partially Met      PT LONG TERM GOAL #3   Title Patient will be able to hold situation 4 of M-CTSIB for >/= 15 seconds to demo improved vestibular input  for balance    Baseline 07/21/21: 5-6 seconds before touching something for balnace    Status Not Met      PT LONG TERM GOAL #5   Title Patient will be able to ambulate >/= 800 ft outdoors unlevel surfaces without AD and Mod I for improved ability for community mobility    Baseline 07/21/21: met in session today    Status Achieved      PT LONG TERM GOAL #6   Title Patient will improve DFS >/= 59    Baseline 07/21/21: scored 51 today, improved from 49 just not to goal level    Status Partially Met               07/21/21 1321  Plan  Clinical Impression Statement Today's skilled session focused on progress toward remaining LTGs. Pt did meet LTG #5 for outdoor gait, partially met LTG #6 to improve DFS and did not meet LTG #3 as he is unable to hold situation 4 of M-CTSIB more than 6 seconds. Remainder of session continued to address balance traning with no issues noted or reported. Pt in agreement to discharge today per PT plan of care.  Personal Factors and Comorbidities Comorbidity 3+;Age  Comorbidities Skin Cancer, CAD, GERD, Sleep Apnea, HTN, HLD, Vestibular Schwannoma  Examination-Activity Limitations Bed Mobility;Bend;Caring for Others;Stairs  Examination-Participation Restrictions Yard Work;Community Activity  Pt will benefit from skilled therapeutic intervention in order to improve on the following deficits Difficulty walking;Abnormal gait;Decreased balance;Decreased endurance;Decreased strength;Decreased knowledge of use of DME;Decreased activity tolerance;Dizziness;Decreased range of motion  Stability/Clinical Decision Making Stable/Uncomplicated  Rehab Potential Good  PT Frequency 2x / week  PT Duration 4 weeks  PT Treatment/Interventions ADLs/Self Care Home Management;Aquatic Therapy;Canalith Repostioning;Cryotherapy;Moist Heat;DME Instruction;Electrical Stimulation;Gait training;Stair training;Functional mobility training;Therapeutic activities;Therapeutic exercise;Balance  training;Neuromuscular re-education;Patient/family education;Vestibular;Dry needling;Manual techniques;Passive range of motion;Joint Manipulations  PT Next Visit Plan discharge pt PT plan  PT [place] and Agree with Plan of Care Patient         Patient will benefit from skilled therapeutic intervention in order to improve  the following deficits and impairments:  Difficulty walking, Abnormal gait, Decreased balance, Decreased endurance, Decreased strength, Decreased knowledge of use of DME, Decreased activity tolerance, Dizziness, Decreased range of motion  Visit Diagnosis: Unsteadiness on feet  Other abnormalities of gait and mobility  Muscle weakness (generalized)  Dizziness and giddiness     Problem List Patient Active Problem List   Diagnosis Date Noted   Sleep apnea 05/07/2021   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   B12 deficiency 01/06/2021   Vitamin D deficiency 01/06/2021   Aortic atherosclerosis ([place]) 01/06/2021   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[place]) 01/06/2021   Allergic rhinitis 01/06/2021   BPH associated with nocturia 01/06/2021   Abdominal wall abscess 10/28/2020   S/P hernia repair 10/13/2020   Osteoarthritis of glenohumeral joint, left 09/21/2017   Non-traumatic rotator cuff tear 09/21/2017   CAD (coronary artery disease) 08/12/2013   Essential hypertension 03/15/2011   GERD (gastroesophageal reflux disease) 03/15/2011    Willow Ora, PTA 07/21/2021, 4:28 PM   Jones Bales, PT, DPT  [facility] [address] [place], [place], [address] Phone: [phone]   Fax:  [phone]  Name: CORNEY KNIGHTON MRN: [id number] Date of Birth: [birth year]

## 2021-07-22 ENCOUNTER — Ambulatory Visit (INDEPENDENT_AMBULATORY_CARE_PROVIDER_SITE_OTHER): Payer: Medicare Other | Admitting: Family Medicine

## 2021-07-22 ENCOUNTER — Other Ambulatory Visit: Payer: Self-pay | Admitting: Family Medicine

## 2021-07-22 ENCOUNTER — Encounter: Payer: Self-pay | Admitting: Family Medicine

## 2021-07-22 VITALS — BP 128/78 | HR 72 | Temp 97.2°F | Ht 67.0 in | Wt 200.6 lb

## 2021-07-22 DIAGNOSIS — D7589 Other specified diseases of blood and blood-forming organs: Secondary | ICD-10-CM

## 2021-07-22 DIAGNOSIS — I10 Essential (primary) hypertension: Secondary | ICD-10-CM

## 2021-07-22 DIAGNOSIS — I2510 Atherosclerotic heart disease of native coronary artery without angina pectoris: Secondary | ICD-10-CM | POA: Diagnosis not present

## 2021-07-22 DIAGNOSIS — E785 Hyperlipidemia, unspecified: Secondary | ICD-10-CM

## 2021-07-22 DIAGNOSIS — I700 Atherosclerosis of aorta: Secondary | ICD-10-CM

## 2021-07-22 DIAGNOSIS — F325 Major depressive disorder, single episode, in full remission: Secondary | ICD-10-CM | POA: Diagnosis not present

## 2021-07-22 DIAGNOSIS — Z8719 Personal history of other diseases of the digestive system: Secondary | ICD-10-CM

## 2021-07-22 DIAGNOSIS — E871 Hypo-osmolality and hyponatremia: Secondary | ICD-10-CM

## 2021-07-22 DIAGNOSIS — E8809 Other disorders of plasma-protein metabolism, not elsewhere classified: Secondary | ICD-10-CM

## 2021-07-22 LAB — COMPREHENSIVE METABOLIC PANEL
ALT: 29 U/L (ref 0–53)
AST: 26 U/L (ref 0–37)
Albumin: 3.3 g/dL — ABNORMAL LOW (ref 3.5–5.2)
Alkaline Phosphatase: 70 U/L (ref 39–117)
BUN: 19 mg/dL (ref 6–23)
CO2: 26 mEq/L (ref 19–32)
Calcium: 8.7 mg/dL (ref 8.4–10.5)
Chloride: 100 mEq/L (ref 96–112)
Creatinine, Ser: 0.91 mg/dL (ref 0.40–1.50)
GFR: 80.04 mL/min (ref >60.00)
Glucose, Bld: 92 mg/dL (ref 70–99)
Potassium: 4.4 mEq/L (ref 3.5–5.1)
Sodium: 130 mEq/L — ABNORMAL LOW (ref 135–145)
Total Bilirubin: 0.5 mg/dL (ref 0.2–1.2)
Total Protein: 10.1 g/dL — ABNORMAL HIGH (ref 6.0–8.3)

## 2021-07-22 LAB — CBC WITH DIFFERENTIAL/PLATELET
Basophils Absolute: 0 10*3/uL (ref 0.0–0.1)
Basophils Relative: 0.5 % (ref 0.0–3.0)
Eosinophils Absolute: 0.1 10*3/uL (ref 0.0–0.7)
Eosinophils Relative: 1.8 % (ref 0.0–5.0)
HCT: 36.1 % — ABNORMAL LOW (ref 39.0–52.0)
Hemoglobin: 12.3 g/dL — ABNORMAL LOW (ref 13.0–17.0)
Lymphocytes Relative: 32.5 % (ref 12.0–46.0)
Lymphs Abs: 1.9 10*3/uL (ref 0.7–4.0)
MCHC: 34 g/dL (ref 30.0–36.0)
MCV: 102.8 fl — ABNORMAL HIGH (ref 78.0–100.0)
Monocytes Absolute: 0.8 10*3/uL (ref 0.1–1.0)
Monocytes Relative: 13.8 % — ABNORMAL HIGH (ref 3.0–12.0)
Neutro Abs: 3 10*3/uL (ref 1.4–7.7)
Neutrophils Relative %: 51.4 % (ref 43.0–77.0)
Platelets: 176 10*3/uL (ref 150.0–400.0)
RBC: 3.51 Mil/uL — ABNORMAL LOW (ref 4.22–5.81)
RDW: 14.1 % (ref 11.5–15.5)
WBC: 5.7 10*3/uL (ref 4.0–10.5)

## 2021-07-22 MED ORDER — LISINOPRIL 10 MG PO TABS: 10.0000 mg | ORAL_TABLET | Freq: Every day | ORAL | 3 refills | Status: DC

## 2021-07-22 NOTE — Patient Instructions (Addendum)
Health Maintenance Due  Topic Date Due   Zoster Vaccines- Shingrix (2 of 2) - Please check with your pharmacy to see if they have the shingrix vaccine. If they do- please get this immunization and update [place] by phone call or mychart with dates you receive the vaccine.  06/01/2020   COVID-19 Vaccine (5 - Booster for [employer] series)- Team please log covid shot.  02/07/2021   Please stop by lab before you go If you have mychart- we will send your results within 3 business days of [place] receiving them.  If you do not have mychart- we will call you about results within 5 business days of [place] receiving them.  *please also note that you will see labs on mychart as soon as they post. I will later go in and write notes on them- will say "notes from Dr. Yong Channel"  Recommended follow up: Return in about 6 months (around 01/20/2022) for follow up- or sooner if needed.  Happy Holidays!

## 2021-07-22 NOTE — Addendum Note (Signed)
Addended by: Loura Back on: 07/22/2021 02:49 PM   Modules accepted: Orders

## 2021-07-22 NOTE — Addendum Note (Signed)
Addended by: Loura Back on: 07/22/2021 02:53 PM   Modules accepted: Orders

## 2021-07-25 ENCOUNTER — Encounter: Payer: Self-pay | Admitting: Family Medicine

## 2021-07-26 ENCOUNTER — Telehealth: Payer: Self-pay

## 2021-07-26 NOTE — Telephone Encounter (Signed)
Returned pt call and a lady stated pt was on the other line and will have him cb.

## 2021-07-26 NOTE — Telephone Encounter (Signed)
Patient requesting call back in regard to lab scheduled for Monday 12/19.

## 2021-08-01 ENCOUNTER — Other Ambulatory Visit (INDEPENDENT_AMBULATORY_CARE_PROVIDER_SITE_OTHER): Payer: Medicare Other

## 2021-08-01 ENCOUNTER — Other Ambulatory Visit: Payer: Medicare Other

## 2021-08-01 ENCOUNTER — Other Ambulatory Visit: Payer: Self-pay

## 2021-08-01 DIAGNOSIS — E8809 Other disorders of plasma-protein metabolism, not elsewhere classified: Secondary | ICD-10-CM | POA: Diagnosis not present

## 2021-08-01 DIAGNOSIS — D7589 Other specified diseases of blood and blood-forming organs: Secondary | ICD-10-CM

## 2021-08-01 DIAGNOSIS — E871 Hypo-osmolality and hyponatremia: Secondary | ICD-10-CM

## 2021-08-01 LAB — CBC WITH DIFFERENTIAL/PLATELET
Basophils Absolute: 0 10*3/uL (ref 0.0–0.1)
Basophils Relative: 0.7 % (ref 0.0–3.0)
Eosinophils Absolute: 0.1 10*3/uL (ref 0.0–0.7)
Eosinophils Relative: 2.2 % (ref 0.0–5.0)
HCT: 36.4 % — ABNORMAL LOW (ref 39.0–52.0)
Hemoglobin: 12.5 g/dL — ABNORMAL LOW (ref 13.0–17.0)
Lymphocytes Relative: 36.9 % (ref 12.0–46.0)
Lymphs Abs: 1.8 10*3/uL (ref 0.7–4.0)
MCHC: 34.3 g/dL (ref 30.0–36.0)
MCV: 103.2 fl — ABNORMAL HIGH (ref 78.0–100.0)
Monocytes Absolute: 0.4 10*3/uL (ref 0.1–1.0)
Monocytes Relative: 8.8 % (ref 3.0–12.0)
Neutro Abs: 2.5 10*3/uL (ref 1.4–7.7)
Neutrophils Relative %: 51.4 % (ref 43.0–77.0)
Platelets: 184 10*3/uL (ref 150.0–400.0)
RBC: 3.53 Mil/uL — ABNORMAL LOW (ref 4.22–5.81)
RDW: 14 % (ref 11.5–15.5)
WBC: 4.9 10*3/uL (ref 4.0–10.5)

## 2021-08-02 ENCOUNTER — Encounter: Payer: Self-pay | Admitting: Family Medicine

## 2021-08-02 LAB — COMPREHENSIVE METABOLIC PANEL
ALT: 31 U/L (ref 0–53)
AST: 28 U/L (ref 0–37)
Albumin: 3.3 g/dL — ABNORMAL LOW (ref 3.5–5.2)
Alkaline Phosphatase: 68 U/L (ref 39–117)
BUN: 13 mg/dL (ref 6–23)
CO2: 24 mEq/L (ref 19–32)
Calcium: 8.8 mg/dL (ref 8.4–10.5)
Chloride: 102 mEq/L (ref 96–112)
Creatinine, Ser: 0.89 mg/dL (ref 0.40–1.50)
GFR: 81.36 mL/min (ref >60.00)
Glucose, Bld: 124 mg/dL — ABNORMAL HIGH (ref 70–99)
Potassium: 4 mEq/L (ref 3.5–5.1)
Sodium: 132 mEq/L — ABNORMAL LOW (ref 135–145)
Total Bilirubin: 0.5 mg/dL (ref 0.2–1.2)
Total Protein: 10.5 g/dL — ABNORMAL HIGH (ref 6.0–8.3)

## 2021-08-03 LAB — PATHOLOGIST SMEAR REVIEW
Basophils Absolute: 0 10*3/uL (ref 0.0–0.2)
Basos: 1 %
EOS (ABSOLUTE): 0.1 10*3/uL (ref 0.0–0.4)
Eos: 2 %
Hematocrit: 37.9 % (ref 37.5–51.0)
Hemoglobin: 12.8 g/dL — ABNORMAL LOW (ref 13.0–17.7)
Immature Grans (Abs): 0.1 10*3/uL (ref 0.0–0.1)
Immature Granulocytes: 2 %
Lymphocytes Absolute: 2 10*3/uL (ref 0.7–3.1)
Lymphs: 38 %
MCH: 34.2 pg — ABNORMAL HIGH (ref 26.6–33.0)
MCHC: 33.8 g/dL (ref 31.5–35.7)
MCV: 101 fL — ABNORMAL HIGH (ref 79–97)
Monocytes Absolute: 0.4 10*3/uL (ref 0.1–0.9)
Monocytes: 8 %
Neutrophils Absolute: 2.7 10*3/uL (ref 1.4–7.0)
Neutrophils: 49 %
Platelets: 184 10*3/uL (ref 150–450)
RBC: 3.74 x10E6/uL — ABNORMAL LOW (ref 4.14–5.80)
RDW: 12.9 % (ref 11.6–15.4)
WBC: 5.4 10*3/uL (ref 3.4–10.8)

## 2021-08-03 LAB — SODIUM, URINE, RANDOM: Sodium, Ur: 174 mmol/L (ref 28–272)

## 2021-08-03 LAB — OSMOLALITY: Osmolality: 303 mOsm/kg (ref 278–305)

## 2021-08-03 LAB — OSMOLALITY, URINE: Osmolality, Ur: 675 mOsm/kg (ref 50–1200)

## 2021-08-04 ENCOUNTER — Encounter: Payer: Self-pay | Admitting: Family Medicine

## 2021-08-09 LAB — MULTIPLE MYELOMA PANEL, SERUM
Albumin SerPl Elph-Mcnc: 4 g/dL (ref 2.9–4.4)
Albumin/Glob SerPl: 0.7 (ref 0.7–1.7)
Alpha 1: 0.3 g/dL (ref 0.0–0.4)
Alpha2 Glob SerPl Elph-Mcnc: 0.6 g/dL (ref 0.4–1.0)
B-Globulin SerPl Elph-Mcnc: 1 g/dL (ref 0.7–1.3)
Gamma Glob SerPl Elph-Mcnc: 4.4 g/dL — ABNORMAL HIGH (ref 0.4–1.8)
Globulin, Total: 6.4 g/dL — ABNORMAL HIGH (ref 2.2–3.9)
IgA/Immunoglobulin A, Serum: 63 mg/dL (ref 61–437)
IgG (Immunoglobin G), Serum: 5908 mg/dL — ABNORMAL HIGH (ref 603–1613)
IgM (Immunoglobulin M), Srm: 28 mg/dL (ref 15–143)
M Protein SerPl Elph-Mcnc: 4.2 g/dL — ABNORMAL HIGH
Total Protein: 10.4 g/dL (ref 6.0–8.5)

## 2021-08-12 ENCOUNTER — Other Ambulatory Visit: Payer: Self-pay | Admitting: Family Medicine

## 2021-08-12 DIAGNOSIS — D472 Monoclonal gammopathy: Secondary | ICD-10-CM

## 2021-08-12 DIAGNOSIS — D7589 Other specified diseases of blood and blood-forming organs: Secondary | ICD-10-CM

## 2021-08-16 ENCOUNTER — Telehealth: Payer: Self-pay | Admitting: Hematology

## 2021-08-16 NOTE — Telephone Encounter (Signed)
Scheduled appt per 12/30 referral. Pt is aware of appt date and time

## 2021-08-18 ENCOUNTER — Telehealth: Payer: Self-pay | Admitting: Family Medicine

## 2021-08-18 ENCOUNTER — Encounter: Payer: Self-pay | Admitting: Family Medicine

## 2021-08-18 NOTE — Telephone Encounter (Signed)
Hi Lattie Haw, can you please help with this referral.

## 2021-08-18 NOTE — Telephone Encounter (Signed)
Pt called and said that he was recently diagnosed with multiple myeloma and he said that he was going to try and get in with [facility]  [place], [place] [address] Phone number: [phone] Fax: [phone]  He would like to get a referral here and he said that they said they would need a call to start the process as well. Please advise.  Callback for patient [phone]   I called them and they need the 3 most recent office notes, labs, any radiology and the first initial consult notes, and demographics

## 2021-08-19 NOTE — Telephone Encounter (Signed)
Patient has called back.  States center can be reached at [phone].  Please follow up with patient in regard at [phone].

## 2021-08-22 ENCOUNTER — Telehealth: Payer: Self-pay | Admitting: Family Medicine

## 2021-08-22 NOTE — Telephone Encounter (Signed)
Pt called regarding a referral to [facility] for his cancer. He stated [facility] has yet to receive it. Can someone please look into this and give him a call back? Thank you

## 2021-08-22 NOTE — Telephone Encounter (Signed)
Hi Wesley [name] told me to send this to you on Friday. Have you started on this referral yet?

## 2021-08-23 NOTE — Telephone Encounter (Signed)
Pt called back and said that [facility] hasnt received the referral, He gave me a fax number of [phone] and a number to call [phone] and press option 1. I faxed to new number and tried to call and left message for them to call me back to let me know if they received it.

## 2021-08-23 NOTE — Telephone Encounter (Signed)
Pt called back again after I told him the fax number he gave me kept failing and I couldn't get in touch with [facility] and he told me the original fax number is where it needed to go which was [phone]. I refaxed again

## 2021-08-23 NOTE — Telephone Encounter (Signed)
Amazing job Lisa-thank so much

## 2021-08-31 ENCOUNTER — Encounter: Payer: Medicare Other | Admitting: Hematology

## 2021-09-08 DIAGNOSIS — M6281 Muscle weakness (generalized): Secondary | ICD-10-CM | POA: Diagnosis not present

## 2021-09-08 DIAGNOSIS — D649 Anemia, unspecified: Secondary | ICD-10-CM | POA: Diagnosis not present

## 2021-09-08 DIAGNOSIS — Z933 Colostomy status: Secondary | ICD-10-CM | POA: Diagnosis not present

## 2021-09-08 DIAGNOSIS — K660 Peritoneal adhesions (postprocedural) (postinfection): Secondary | ICD-10-CM | POA: Diagnosis not present

## 2021-09-08 DIAGNOSIS — D539 Nutritional anemia, unspecified: Secondary | ICD-10-CM | POA: Diagnosis not present

## 2021-09-08 DIAGNOSIS — K5790 Diverticulosis of intestine, part unspecified, without perforation or abscess without bleeding: Secondary | ICD-10-CM | POA: Diagnosis not present

## 2021-09-08 DIAGNOSIS — I2510 Atherosclerotic heart disease of native coronary artery without angina pectoris: Secondary | ICD-10-CM | POA: Diagnosis not present

## 2021-09-08 DIAGNOSIS — I10 Essential (primary) hypertension: Secondary | ICD-10-CM | POA: Diagnosis not present

## 2021-09-08 DIAGNOSIS — C9000 Multiple myeloma not having achieved remission: Secondary | ICD-10-CM | POA: Diagnosis not present

## 2021-09-08 DIAGNOSIS — G629 Polyneuropathy, unspecified: Secondary | ICD-10-CM | POA: Diagnosis not present

## 2021-09-14 DIAGNOSIS — C9000 Multiple myeloma not having achieved remission: Secondary | ICD-10-CM | POA: Diagnosis not present

## 2021-09-15 DIAGNOSIS — C9000 Multiple myeloma not having achieved remission: Secondary | ICD-10-CM | POA: Diagnosis not present

## 2021-09-19 ENCOUNTER — Telehealth: Payer: Self-pay | Admitting: *Deleted

## 2021-09-19 NOTE — Telephone Encounter (Signed)
TCT patient regarding setting up a new patient visit. Spoke with patient.  He has been seen at [facility] and work up for Multiple Myeloma has been done there. Pt is wanting treatment locally. He has not yet started treatment @ [facility]. Advised that that Dr. Lorenso Courier prefers to start pt on treatment  rather than continue treatment that has been started elsewhere.  Pt is in agreement and prefers to start treatment here. Able to get him scheduled on 09/26/21 @ 1pm Advised on office location.

## 2021-09-26 ENCOUNTER — Inpatient Hospital Stay: Payer: Medicare Other

## 2021-09-26 ENCOUNTER — Other Ambulatory Visit: Payer: Self-pay | Admitting: *Deleted

## 2021-09-26 ENCOUNTER — Other Ambulatory Visit: Payer: Self-pay

## 2021-09-26 ENCOUNTER — Encounter: Payer: Self-pay | Admitting: Hematology and Oncology

## 2021-09-26 ENCOUNTER — Inpatient Hospital Stay: Payer: Medicare Other | Attending: Hematology and Oncology | Admitting: Hematology and Oncology

## 2021-09-26 ENCOUNTER — Other Ambulatory Visit (HOSPITAL_COMMUNITY): Payer: Self-pay

## 2021-09-26 ENCOUNTER — Ambulatory Visit: Payer: Medicare Other | Admitting: Podiatry

## 2021-09-26 VITALS — BP 152/90 | HR 88 | Temp 98.3°F | Resp 17 | Wt 198.1 lb

## 2021-09-26 DIAGNOSIS — D333 Benign neoplasm of cranial nerves: Secondary | ICD-10-CM | POA: Diagnosis not present

## 2021-09-26 DIAGNOSIS — Z79899 Other long term (current) drug therapy: Secondary | ICD-10-CM | POA: Insufficient documentation

## 2021-09-26 DIAGNOSIS — Z5111 Encounter for antineoplastic chemotherapy: Secondary | ICD-10-CM | POA: Insufficient documentation

## 2021-09-26 DIAGNOSIS — C9000 Multiple myeloma not having achieved remission: Secondary | ICD-10-CM

## 2021-09-26 HISTORY — DX: Multiple myeloma not having achieved remission: C90.00

## 2021-09-26 LAB — CBC WITH DIFFERENTIAL (CANCER CENTER ONLY)
Abs Immature Granulocytes: 0.06 10*3/uL (ref 0.00–0.07)
Basophils Absolute: 0.1 10*3/uL (ref 0.0–0.1)
Basophils Relative: 1 %
Eosinophils Absolute: 0.3 10*3/uL (ref 0.0–0.5)
Eosinophils Relative: 6 %
HCT: 36.3 % — ABNORMAL LOW (ref 39.0–52.0)
Hemoglobin: 12.4 g/dL — ABNORMAL LOW (ref 13.0–17.0)
Immature Granulocytes: 1 %
Lymphocytes Relative: 28 %
Lymphs Abs: 1.5 10*3/uL (ref 0.7–4.0)
MCH: 35.3 pg — ABNORMAL HIGH (ref 26.0–34.0)
MCHC: 34.2 g/dL (ref 30.0–36.0)
MCV: 103.4 fL — ABNORMAL HIGH (ref 80.0–100.0)
Monocytes Absolute: 0.9 10*3/uL (ref 0.1–1.0)
Monocytes Relative: 17 %
Neutro Abs: 2.5 10*3/uL (ref 1.7–7.7)
Neutrophils Relative %: 47 %
Platelet Count: 184 10*3/uL (ref 150–400)
RBC: 3.51 MIL/uL — ABNORMAL LOW (ref 4.22–5.81)
RDW: 13.9 % (ref 11.5–15.5)
WBC Count: 5.4 10*3/uL (ref 4.0–10.5)
nRBC: 0 % (ref 0.0–0.2)

## 2021-09-26 LAB — CMP (CANCER CENTER ONLY)
ALT: 27 U/L (ref 0–44)
AST: 26 U/L (ref 15–41)
Albumin: 3.3 g/dL — ABNORMAL LOW (ref 3.5–5.0)
Alkaline Phosphatase: 83 U/L (ref 38–126)
Anion gap: 3 — ABNORMAL LOW (ref 5–15)
BUN: 19 mg/dL (ref 8–23)
CO2: 25 mmol/L (ref 22–32)
Calcium: 8.7 mg/dL — ABNORMAL LOW (ref 8.9–10.3)
Chloride: 103 mmol/L (ref 98–111)
Creatinine: 0.82 mg/dL (ref 0.61–1.24)
GFR, Estimated: >60 mL/min (ref >60)
Glucose, Bld: 92 mg/dL (ref 70–99)
Potassium: 4.1 mmol/L (ref 3.5–5.1)
Sodium: 131 mmol/L — ABNORMAL LOW (ref 135–145)
Total Bilirubin: 0.6 mg/dL (ref 0.3–1.2)
Total Protein: 11.2 g/dL — ABNORMAL HIGH (ref 6.5–8.1)

## 2021-09-26 LAB — TYPE AND SCREEN
ABO/RH(D): A NEG
Antibody Screen: NEGATIVE

## 2021-09-26 LAB — LACTATE DEHYDROGENASE: LDH: 167 U/L (ref 98–192)

## 2021-09-26 MED ORDER — PROCHLORPERAZINE MALEATE 10 MG PO TABS: 10.0000 mg | ORAL_TABLET | Freq: Four times a day (QID) | ORAL | 0 refills | Status: DC | PRN

## 2021-09-26 MED ORDER — ONDANSETRON HCL 8 MG PO TABS: 8.0000 mg | ORAL_TABLET | Freq: Three times a day (TID) | ORAL | 0 refills | Status: DC | PRN

## 2021-09-26 MED ORDER — LENALIDOMIDE 25 MG PO CAPS: 25.0000 mg | ORAL_CAPSULE | Freq: Every day | ORAL | 0 refills | Status: DC

## 2021-09-26 MED ORDER — ALLOPURINOL 300 MG PO TABS: 300.0000 mg | ORAL_TABLET | Freq: Every day | ORAL | 1 refills | Status: DC

## 2021-09-26 NOTE — Progress Notes (Signed)
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 Further workup showed SPEP M-Spike 4.2 g/dL with IgG-Kappa as monoclonal protein, IgG 5908 mg/dL 09/08/2021: Initial evaluation at [facility]. bone marrow biopsy performed, showed 60% abnormal plasma cells in 60% cellular marrow. 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  09/26/2021: transfer care to Dr. Lorenso Courier at [facility].   CHIEF COMPLAINTS/PURPOSE OF CONSULTATION:  " IgG Kappa Multiple Myeloma "  HISTORY OF PRESENTING ILLNESS:  Wesley [name] 81 y.o. male with medical history significant for coronary artery disease, GERD, hypertension, OSA, and BPH who presents for evaluation of newly diagnosed multiple myeloma.  On review of the previous records Mr. Schlitt was noted to have high serum protein and a mild anemia on 07/22/2021.  He was initially evaluated at [facility] with an SPEP which showed an M spike of 4.2 with IgG kappa monoclonal specificity.  Due to concern for these findings he underwent a bone marrow biopsy which showed 60% abnormal plasma cells.  Kappa lambda ratio was 45.82 with kappa of 35.28 and lambda at 0.77.  Skeletal survey fortunately showed no evidence of osseous disease.  The patient was set to start care at [facility] however given the distance he preferred local therapy.  Therefore he was referred to the [facility] for  further evaluation and management.  On exam today [name] is accompanied by his wife and daughter.  On exam today Mr. Carlis Abbott notes that he "feels okay".  He notes that his major symptom has been low energy as well as anxiety.  He does have occasional dull headaches as well.  He notes that he has not been having any new bone or back pain.  He is also had steady weight with a good appetite.  He denies any fevers, chills, sweats, nausea, ming or diarrhea.  On further discussion he notes that he had a brother who had melanoma and a father who had cardiac disease.  He notes his mother had type 2 diabetes and died in her sleep.  He reports he has 2 healthy children and 5 healthy grandchildren.  He is a never smoker but does enjoy drinking 2 to 3 glasses of wine nightly.  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  He notes that he occasionally gets some tingling in his fingers and occasionally some of that sensation in his feet.  He is also been having some occasional issues with his balance lately.  He otherwise denies any bleeding, dark stools though he does endorse occasional bruising on his aspirin.  A full 10 point ROS is listed below.  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)    CAD (coronary artery disease)    Cholelithiasis    Chronic allergic rhinitis    Coronary atherosclerosis of native coronary artery    Fatty liver    GERD (gastroesophageal reflux disease)    HX 78yrs ago,  no longer a problem, no meds   High blood pressure    Hypercholesterolemia    Macrocytosis without anemia    Multiple myeloma not having achieved remission ([place]) 09/26/2021   OSA on CPAP    Paraesophageal hernia    Partial bowel obstruction (HCC)    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 PT denies significant findings   CARDIAC CATHETERIZATION  2008   COLOSTOMY N/A 10/31/2019   Procedure: COLOSTOMY;  Surgeon: Erroll Luna, MD;  Location: [facility];  Service: General;  Laterality: N/A;   CYSTOSCOPY W/ URETERAL STENT PLACEMENT Bilateral 10/31/2019   Procedure: CYSTOSCOPY URETERAL STENT PLACEMENT BILATERAL;  Surgeon: Franchot Gallo, MD;  Location: [facility];  Service: Urology;  Laterality: Bilateral;   CYSTOSCOPY WITH STENT PLACEMENT Bilateral 03/11/2020   Procedure: CYSTOSCOPY WITH BILATERAL FIREFLY INJECTION;  Surgeon: Irine Seal, MD;  Location: [facility];  Service: Urology;  Laterality: Bilateral;   EYE SURGERY     BILATERAL CATARACT SURGERY WITH LENS IMPLANTS   FLEXIBLE SIGMOIDOSCOPY N/A 10/29/2019   Procedure: FLEXIBLE SIGMOIDOSCOPY;  Surgeon: Jerene Bears, MD;  Location: [facility];  Service: Gastroenterology;  Laterality: N/A;   INCISIONAL HERNIA REPAIR N/A 10/13/2020   Procedure: OPEN INCISIONAL HERNIA REPAIR WITH MESH;  Surgeon: [name], MD;  Location: [place];  Service: General;  Laterality: N/A;   INSERTION OF MESH N/A 09/08/2016   Procedure: INSERTION OF MESH;  Surgeon: Jackolyn Confer, MD;  Location: [facility];  Service: General;  Laterality: N/A;   IR RADIOLOGIST EVAL & MGMT  11/10/2020   IR RADIOLOGIST EVAL & MGMT  11/25/2020   LAPAROSCOPIC SIGMOID COLECTOMY N/A 10/31/2019   Procedure: DIAGNOSTIC LAPAROSCOPY; EXPLORATORY LAPAROTOMY; SIGMOID COLECTOMY;  Surgeon: Erroll Luna, MD;  [facility]: [facility];  Service: General;  Laterality: N/A;   right rotator cuff     SUBMUCOSAL TATTOO INJECTION  10/29/2019   Procedure: SUBMUCOSAL TATTOO INJECTION;  Surgeon: Jerene Bears, MD;  [facility];  Service: Gastroenterology;;   TONSILLECTOMY     UMBILICAL HERNIA  REPAIR N/A 09/08/2016   Procedure: OPEN UMBILICAL HERNIA REPAIR WITH MESH;  Surgeon: Jackolyn Confer, MD;  [facility];  Service: General;  Laterality: N/A;   VENTRAL HERNIA REPAIR N/A 02/03/2021   Procedure: LAPAROSCOPIC VENTRAL HERNIA REPAIR WITH MESH;  Surgeon: [name], MD;  Location: [place];  Service: General;  Laterality: N/A;   XI ROBOTIC ASSISTED COLOSTOMY TAKEDOWN N/A 03/11/2020   Procedure: ROBOTIC ASSISTED COLOSTOMY REVERSAL, RIGID PROCTOSCOPY;  Surgeon: Leighton Ruff, MD;  [facility];  Service: General;  Laterality: N/A;    SOCIAL HISTORY: Social History   Socioeconomic History   Marital status: Married    Spouse name: Darryon Bastin    Number of children: 2   Years of education: 12   Highest education level: Master's degree (e.g., MA, MS, MEng, MEd, MSW, MBA)  Occupational History   Occupation: Retired   Tobacco Use   Smoking status: Never   Smokeless tobacco: Never  Vaping Use   Vaping Use: Never used  Substance and Sexual Activity   Alcohol use: Yes    Alcohol/week: 17.0 standard drinks  Types: 3 Glasses of wine, 14 Standard drinks or equivalent per week   Drug use: No   Sexual activity: Not Currently  Other Topics Concern   Not on file  Social History Narrative   Married. 2 children 52 in 54 in 2022- both went to [place]. 5 grandkids- all girls from oldest 19 looking premed [employer], 7 soph [place], 48 105 year olds, 81 year olds.       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 [employer]: fishing, [employer] football and basketball, [place], [place] Strain: Low Risk    Difficulty of [place]: Not hard at [place]: No Food Insecurity   Worried About Charity fundraiser in [place]: Never true   Arboriculturist in the Last Year: Never true   Transportation Needs: No Transportation Needs   Lack of Transportation (Medical): No   Lack of Transportation (Non-Medical): No  Physical Activity: Inactive   Days of Exercise per Week: 0 days   Minutes of Exercise per Session: 0 min  Stress: No Stress Concern Present   Feeling of Stress : Not at all  Social Connections: Socially Integrated   Frequency of Communication with Friends and Family: More than three times a week   Frequency of Social Gatherings with Friends and Family: Once a week   Attends Religious Services: More than 4 times per year   Active Member of [employer] or Organizations: Yes   Attends Music therapist: More than 4 times per year   Marital Status: Married  Human resources officer Violence: Not At Risk   Fear of Current or Ex-Partner: No   Emotionally Abused: No   Physically Abused: No   Sexually Abused: No    FAMILY HISTORY: Family History  Problem Relation Age of Onset   Diabetes Mother    Heart failure Mother        around 75   CAD Father        45   Melanoma Brother 69   Healthy Daughter    Healthy Son    Colon cancer Neg Hx    Esophageal cancer Neg Hx    Rectal cancer Neg Hx    Stomach cancer Neg Hx     ALLERGIES:  is allergic to demerol, promethazine hcl, and ativan [lorazepam].  MEDICATIONS:  Current Outpatient Medications  Medication Sig Dispense Refill   acetaminophen (TYLENOL) 500 MG tablet Take 500 mg by mouth every 6 (six) hours as needed.     allopurinol (ZYLOPRIM) 300 MG tablet Take 1 tablet (300 mg total) by mouth daily. 60 tablet 1   atorvastatin (LIPITOR) 80 MG tablet Take 1 tablet by mouth daily.     fexofenadine (ALLEGRA) 180 MG tablet      Ibuprofen 200 MG CAPS      Magnesium Oxide 400 MG CAPS      melatonin 5 MG TABS      ondansetron (ZOFRAN) 8 MG tablet Take 1 tablet (8 mg total) by mouth every 8 (eight) hours as needed. 30 tablet 0   prochlorperazine (COMPAZINE) 10 MG tablet Take 1 tablet (10 mg total) by mouth every 6  (six) hours as needed for nausea or vomiting. 30 tablet 0   vitamin B-12 (CYANOCOBALAMIN) 1000 MCG tablet Take  1,000 mcg by mouth daily.     acyclovir (ZOVIRAX) 400 MG tablet Take 400 mg by mouth in the morning and at bedtime.     aspirin EC 81 MG tablet Take 81 mg by mouth daily. Swallow whole.     Cholecalciferol (VITAMIN D3) 25 MCG (1000 UT) CAPS Take 1,000 Units by mouth daily.     dexamethasone (DECADRON) 4 MG tablet Take by mouth.     finasteride (PROSCAR) 5 MG tablet Take 5 mg by mouth daily.     lenalidomide (REVLIMID) 25 MG capsule Take 1 capsule (25 mg total) by mouth daily. [facility] Auth # [id number]     Date Obtained 09/26/21  Take 1 tablet tablet for 21 days then none for 7 days 21 capsule 0   lisinopril (ZESTRIL) 10 MG tablet Take 1 tablet (10 mg total) by mouth daily. 90 tablet 3   Omega-3 Fatty Acids (FISH OIL) 1200 MG CAPS Take 1,200 mg by mouth daily.     pantoprazole (PROTONIX) 40 MG tablet Take by mouth.     No current facility-administered medications for this visit.    REVIEW OF SYSTEMS:   Constitutional: ( - ) fevers, ( - )  chills , ( - ) night sweats Eyes: ( - ) blurriness of vision, ( - ) double vision, ( - ) watery eyes Ears, nose, mouth, throat, and face: ( - ) mucositis, ( - ) sore throat Respiratory: ( - ) cough, ( - ) dyspnea, ( - ) wheezes Cardiovascular: ( - ) palpitation, ( - ) chest discomfort, ( - ) lower extremity swelling Gastrointestinal:  ( - ) nausea, ( - ) heartburn, ( - ) change in bowel habits Skin: ( - ) abnormal skin rashes Lymphatics: ( - ) new lymphadenopathy, ( - ) easy bruising Neurological: ( - ) numbness, ( - ) tingling, ( - ) new weaknesses Behavioral/Psych: ( - ) mood change, ( - ) new changes  All other systems were reviewed with the patient and are negative.  PHYSICAL EXAMINATION: ECOG PERFORMANCE STATUS: 1 - Symptomatic but completely ambulatory  Vitals:   09/26/21 1305  BP: (!) 152/90  Pulse: 88  Resp: 17  Temp: 98.3 F (36.8  C)  SpO2: 95%   Filed Weights   09/26/21 1305  Weight: 198 lb 1.6 oz (89.9 kg)    GENERAL: well appearing elderly Caucasian male in NAD  SKIN: skin color, texture, turgor are normal, no rashes or significant lesions EYES: conjunctiva are pink and non-injected, sclera clear LUNGS: clear to auscultation and percussion with normal breathing effort HEART: regular rate & rhythm and no murmurs and no lower extremity edema Musculoskeletal: no cyanosis of digits and no clubbing  PSYCH: alert & oriented x 3, fluent speech NEURO: no focal motor/sensory deficits  LABORATORY DATA:  I have reviewed the data as listed CBC Latest Ref Rng & Units 09/26/2021 08/01/2021 08/01/2021  WBC 4.0 - 10.5 K/uL 5.4 5.4 4.9  Hemoglobin 13.0 - 17.0 g/dL 12.4(L) 12.5(L) 12.8(L)  Hematocrit 39.0 - 52.0 % 36.3(L) 36.4(L) 37.9  Platelets 150 - 400 K/uL 184 184.0 184    CMP Latest Ref Rng & Units 09/26/2021 08/01/2021 08/01/2021  Glucose 70 - 99 mg/dL 92 124(H) -  BUN 8 - 23 mg/dL 19 13 -  Creatinine 0.61 - 1.24 mg/dL 0.82 0.89 -  Sodium 135 - 145 mmol/L 131(L) 132(L) -  Potassium 3.5 - 5.1 mmol/L 4.1 4.0 -  Chloride 98 - 111 mmol/L 103 102 -  CO2 22 - 32 mmol/L 25 24 -  Calcium 8.9 - 10.3 mg/dL 8.7(L) 8.8 -  Total Protein 6.5 - 8.1 g/dL 11.2(H) 10.4(HH) 10.5(H)  Total Bilirubin 0.3 - 1.2 mg/dL 0.6 0.5 -  Alkaline Phos 38 - 126 U/L 83 68 -  AST 15 - 41 U/L 26 28 -  ALT 0 - 44 U/L 27 31 -     PATHOLOGY:  A-C. Bone marrow, (aspirate smear, touch preparation, clot section, core biopsy):   Plasma cell myeloma.      60% abnormal plasma cells in 60% cellular marrow.  Reduced trilineage hematopoiesis in the background Negative for increased blasts and significant dysplasia. Negative for lymphoma. See comment.   Comment: Corresponding flow cytometry analysis, [phone] shows 9.2% phenotypically abnormal plasma cells supporting the diagnosis above.  IHC stains will be performed to further characterize the  abnormal plasma cells and will be reported in an addendum.   RADIOGRAPHIC STUDIES: No results found.  ASSESSMENT & PLAN HOLLEY WIRT 81 y.o. male with medical history significant for coronary artery disease, GERD, hypertension, OSA, and BPH who presents for evaluation of newly diagnosed multiple myeloma.  After review of the labs, review of the records, and discussion with the patient the patients findings are most consistent with newly diagnosed IgG kappa multiple myeloma.  The patient meets diagnostic criteria by having greater than 60% plasma cells in his bone marrow.  At this time we are in agreement with [facility] in [place], Revlimid, and dexamethasone as the treatment of choice.  Today we discussed the concept of comanaged care.  Comanaged care is when the patient has a local primary provider who administers local support and therapy while expert advice and treatment recommendations are rendered by a cancer specialist at a large academic center.  In this arrangement we provide local support, labs, treatment, and emergency visits, however the major decisions regarding the course of treatment are decided by a physician at an academic medical center.  The patient voiced his understanding of comanaged care and was agreeable to proceeding forward with this care model.  The regimen of choice for this patient will be Dara, Revlimid, and dexamethasone.  We will start with weekly treatments for the first 2 cycles.  After that for an additional 4 cycles he will be getting it every other week.  After cycle 6 the patient will then continue with monthly treatment.  Today we discussed that the goal of treatment is to get the patient into a complete remission which is feasible.  He voices understanding of this plan moving forward.  # IgG Kappa Multiple Myeloma --Patient meets diagnostic criteria for multiple myeloma based on the presence of 60% plasma cells in the bone marrow --We  will order baseline labs today to include SPEP, serum free light chains, LDH, and beta-2 microglobulin --Additionally we will order CBC, CMP, LDH with every treatment --Pretreatment phenotype RBC as well as type and screen ordered today --Plan to proceed with Dara, Revlimid, and dexamethasone as soon as feasible --Plan to start with Dara and dexamethasone alone, scheduled for Friday, 09/30/2020 --Plan to see patient back for his second dose of daratumumab.  #Supportive Care -- chemotherapy education to be scheduled  -- port placement not required.  -- zofran 58m q8H PRN and compazine 149mPO q6H for nausea -- acyclovir 40073mO BID for VCZ prophylaxis -- allopurinol 300m24m daily for TLS prophylaxis -- zometa to start as soon as is feasible.  -- no pain  medication required at this time.    Orders Placed This Encounter  Procedures   CBC with Differential ([place] Only)    Standing Status:   Future    Number of Occurrences:   1    Standing Expiration Date:   09/26/2022   CMP ([place] only)    Standing Status:   Future    Number of Occurrences:   1    Standing Expiration Date:   09/26/2022   Lactate dehydrogenase (LDH)    Standing Status:   Future    Number of Occurrences:   1    Standing Expiration Date:   09/26/2022   Beta 2 microglobulin    Standing Status:   Future    Number of Occurrences:   1    Standing Expiration Date:   09/26/2022   Multiple Myeloma Panel (SPEP&IFE w/QIG)    Standing Status:   Future    Number of Occurrences:   1    Standing Expiration Date:   09/26/2022   Kappa/lambda light chains    Standing Status:   Future    Number of Occurrences:   1    Standing Expiration Date:   09/26/2022   Pretreatment RBC phenotype    Order Specific Question:   Medication to be given:    Answer:   Daratumumab   Type and screen         Standing Status:   Future    Number of Occurrences:   1    Standing Expiration Date:   09/26/2022    All questions were  answered. The patient knows to call the clinic with any problems, questions or concerns.  A total of more than 60 minutes were spent on this encounter with face-to-face time and non-face-to-face time, including preparing to see the patient, ordering tests and/or medications, counseling the patient and coordination of care as outlined above.   Ledell Peoples, MD Department of Hematology/Oncology [place] at [facility] Phone: [phone] Pager: [phone] Email: [email or web address]  09/27/2021 9:42 AM

## 2021-09-26 NOTE — Progress Notes (Signed)
START ON PATHWAY REGIMEN - Multiple Myeloma and Other Plasma Cell Dyscrasias     Cycles 1 and 2: A cycle is every 28 days:     Lenalidomide      Dexamethasone      Daratumumab and hyaluronidase-fihj    Cycles 3 through 6: A cycle is every 28 days:     Lenalidomide      Dexamethasone      Daratumumab and hyaluronidase-fihj    Cycles 7 and beyond: A cycle is every 28 days:     Lenalidomide      Dexamethasone      Daratumumab and hyaluronidase-fihj   **Always confirm dose/schedule in your pharmacy ordering system**  Patient Characteristics: Multiple Myeloma, Newly Diagnosed, Transplant Ineligible or Refused, Unknown or Awaiting Test Results Disease Classification: Multiple Myeloma R-ISS Staging: II Therapeutic Status: Newly Diagnosed Is Patient Eligible for Transplant<= Transplant Ineligible or Refused Risk Status: Awaiting Test Results Intent of Therapy: Curative Intent, Discussed with Patient 

## 2021-09-26 NOTE — Progress Notes (Signed)
Patient -Physician enrollment form for Revlimid completed with patient with his wife and daughter in attendance. Pt voiced understanding to review of medication, side effects and method of obtaining. Fax'd enrollment form to [facility]. Authorization # obtained. Prescription to be signed by Dr. Lorenso Courier. Forms and prescription placed in oral chemo pharmacy folder upon completion

## 2021-09-27 ENCOUNTER — Other Ambulatory Visit (HOSPITAL_COMMUNITY): Payer: Self-pay

## 2021-09-27 ENCOUNTER — Telehealth: Payer: Self-pay | Admitting: Pharmacist

## 2021-09-27 ENCOUNTER — Encounter: Payer: Self-pay | Admitting: Hematology and Oncology

## 2021-09-27 ENCOUNTER — Telehealth: Payer: Self-pay

## 2021-09-27 DIAGNOSIS — C9000 Multiple myeloma not having achieved remission: Secondary | ICD-10-CM

## 2021-09-27 LAB — KAPPA/LAMBDA LIGHT CHAINS
Kappa free light chain: 449.4 mg/L — ABNORMAL HIGH (ref 3.3–19.4)
Kappa, lambda light chain ratio: 70.22 — ABNORMAL HIGH (ref 0.26–1.65)
Lambda free light chains: 6.4 mg/L (ref 5.7–26.3)

## 2021-09-27 LAB — PRETREATMENT RBC PHENOTYPE: DAT, IgG: POSITIVE

## 2021-09-27 LAB — BETA 2 MICROGLOBULIN, SERUM: Beta-2 Microglobulin: 4.9 mg/L — ABNORMAL HIGH (ref 0.6–2.4)

## 2021-09-27 MED ORDER — LENALIDOMIDE 25 MG PO CAPS: 25.0000 mg | ORAL_CAPSULE | Freq: Every day | ORAL | 0 refills | Status: DC

## 2021-09-27 MED ORDER — DEXAMETHASONE 4 MG PO TABS: 20.0000 mg | ORAL_TABLET | ORAL | 3 refills | Status: DC

## 2021-09-27 NOTE — Telephone Encounter (Addendum)
Oral Oncology Pharmacist Encounter  Received new prescription for Revlimid (lenalidomide) for the treatment of multiple myeloma in conjunction with daratumumab and dexamethasone, planned duration until disease progression or unacceptable drug toxicity.  CMP and CBC w/ Diff from 09/26/21 assessed, no relevant lab abnormalities noted. Prescription dose and frequency assessed for appropriateness. Appropriate for therapy initiation.   Current medication list in Epic reviewed, no relevant/significant DDIs with Revlimid identified.  Evaluated chart and no patient barriers to medication adherence noted.   Patient's insurance requires Revlimid to be filled through [facility] Prescription redirected for dispensing.   Oral Oncology Clinic will continue to follow for insurance authorization, copayment issues, initial counseling and start date.  Leron Croak, PharmD, BCPS Hematology/Oncology Clinical Pharmacist [facility] and [place] [phone] 09/27/2021 8:02 AM

## 2021-09-27 NOTE — Telephone Encounter (Signed)
Oral Oncology Patient Advocate Encounter   Received notification from [facility] that prior authorization for Revlimid is required.   PA submitted on CoverMyMeds Key [id number] Status is pending   Oral Oncology Clinic will continue to follow.  [place] Patient [place] Phone [phone] Fax [phone] 09/27/2021 9:09 AM

## 2021-09-28 ENCOUNTER — Telehealth: Payer: Self-pay | Admitting: *Deleted

## 2021-09-28 ENCOUNTER — Telehealth: Payer: Self-pay

## 2021-09-28 NOTE — Telephone Encounter (Signed)
Received call from pt regarding regarding his Patient Education appt and his 1st treatment which Dr. Lorenso Courier has asked to start on 09/30/21. Message sent to scheduler for f/u

## 2021-09-28 NOTE — Telephone Encounter (Signed)
Oral Oncology Patient Advocate Encounter  Met patient to complete application for [facility] Patient Access Support in an effort to reduce patient's out of pocket expense for Revlimid to $0.    Application completed and faxed to [phone]   [facility] Access Support phone number for follow up is [phone].  This encounter will be updated until final determination.  [place] Patient [place] Phone [phone] Fax [phone] 09/28/2021 1:34 PM

## 2021-09-28 NOTE — Telephone Encounter (Signed)
Oral Oncology Patient Advocate Encounter  Prior Authorization for Revlimid has been approved.    PA# [id number]  Effective dates: 08/14/21 through 08/13/22  Oral Oncology Clinic will continue to follow.   Wesley [name] Patient [place] Phone [phone] Fax [phone] 09/28/2021 9:23 AM

## 2021-09-29 ENCOUNTER — Other Ambulatory Visit: Payer: Self-pay

## 2021-09-29 ENCOUNTER — Inpatient Hospital Stay: Payer: Medicare Other

## 2021-09-30 ENCOUNTER — Inpatient Hospital Stay: Payer: Medicare Other

## 2021-09-30 ENCOUNTER — Other Ambulatory Visit: Payer: Self-pay | Admitting: Hematology and Oncology

## 2021-09-30 VITALS — BP 135/87 | HR 80 | Temp 97.6°F | Resp 16

## 2021-09-30 DIAGNOSIS — Z79899 Other long term (current) drug therapy: Secondary | ICD-10-CM | POA: Diagnosis not present

## 2021-09-30 DIAGNOSIS — Z5111 Encounter for antineoplastic chemotherapy: Secondary | ICD-10-CM | POA: Diagnosis not present

## 2021-09-30 DIAGNOSIS — C9000 Multiple myeloma not having achieved remission: Secondary | ICD-10-CM

## 2021-09-30 LAB — MULTIPLE MYELOMA PANEL, SERUM
Albumin SerPl Elph-Mcnc: 4.3 g/dL (ref 2.9–4.4)
Albumin/Glob SerPl: 0.6 — ABNORMAL LOW (ref 0.7–1.7)
Alpha 1: 0.3 g/dL (ref 0.0–0.4)
Alpha2 Glob SerPl Elph-Mcnc: 0.8 g/dL (ref 0.4–1.0)
B-Globulin SerPl Elph-Mcnc: 1 g/dL (ref 0.7–1.3)
Gamma Glob SerPl Elph-Mcnc: 5.1 g/dL — ABNORMAL HIGH (ref 0.4–1.8)
Globulin, Total: 7.2 g/dL — ABNORMAL HIGH (ref 2.2–3.9)
IgA: 52 mg/dL — ABNORMAL LOW (ref 61–437)
IgG (Immunoglobin G), Serum: 6897 mg/dL — ABNORMAL HIGH (ref 603–1613)
IgM (Immunoglobulin M), Srm: 22 mg/dL (ref 15–143)
M Protein SerPl Elph-Mcnc: 5 g/dL — ABNORMAL HIGH
Total Protein ELP: 11.5 g/dL — ABNORMAL HIGH (ref 6.0–8.5)

## 2021-09-30 MED ORDER — DARATUMUMAB-HYALURONIDASE-FIHJ 1800-30000 MG-UT/15ML ~~LOC~~ SOLN
1800.0000 mg | Freq: Once | SUBCUTANEOUS | Status: AC
  Administered 2021-09-30: 1800 mg via SUBCUTANEOUS
  Filled 2021-09-30: qty 15

## 2021-09-30 MED ORDER — DIPHENHYDRAMINE HCL 25 MG PO CAPS
50.0000 mg | ORAL_CAPSULE | Freq: Once | ORAL | Status: AC
  Administered 2021-09-30: 50 mg via ORAL
  Filled 2021-09-30: qty 2

## 2021-09-30 MED ORDER — MONTELUKAST SODIUM 10 MG PO TABS
10.0000 mg | ORAL_TABLET | Freq: Once | ORAL | Status: AC
  Administered 2021-09-30: 10 mg via ORAL
  Filled 2021-09-30: qty 1

## 2021-09-30 MED ORDER — ACETAMINOPHEN 325 MG PO TABS
650.0000 mg | ORAL_TABLET | Freq: Once | ORAL | Status: AC
  Administered 2021-09-30: 650 mg via ORAL
  Filled 2021-09-30: qty 2

## 2021-09-30 MED ORDER — DEXAMETHASONE 4 MG PO TABS
20.0000 mg | ORAL_TABLET | Freq: Once | ORAL | Status: AC
  Administered 2021-09-30: 20 mg via ORAL
  Filled 2021-09-30: qty 5

## 2021-09-30 NOTE — Patient Instructions (Signed)
[facility]  Discharge Instructions: Thank you for choosing [facility] to provide your oncology and hematology care.   If you have a lab appointment with the [facility], please go directly to the [facility] and check in at the registration area.   Wear comfortable clothing and clothing appropriate for easy access to any Portacath or PICC line.   We strive to give you quality time with your provider. You may need to reschedule your appointment if you arrive late (15 or more minutes).  Arriving late affects you and other patients whose appointments are after yours.  Also, if you miss three or more appointments without notifying the office, you may be dismissed from the clinic at the providers discretion.      For prescription refill requests, have your pharmacy contact our office and allow 72 hours for refills to be completed.    Today you received the following chemotherapy and/or immunotherapy agents: Darzalex Faspro   To help prevent nausea and vomiting after your treatment, we encourage you to take your nausea medication as directed.  BELOW ARE SYMPTOMS THAT SHOULD BE REPORTED IMMEDIATELY: *FEVER GREATER THAN 100.4 F (38 C) OR HIGHER *CHILLS OR SWEATING *NAUSEA AND VOMITING THAT IS NOT CONTROLLED WITH YOUR NAUSEA MEDICATION *UNUSUAL SHORTNESS OF BREATH *UNUSUAL BRUISING OR BLEEDING *URINARY PROBLEMS (pain or burning when urinating, or frequent urination) *BOWEL PROBLEMS (unusual diarrhea, constipation, pain near the anus) TENDERNESS IN MOUTH AND THROAT WITH OR WITHOUT PRESENCE OF ULCERS (sore throat, sores in mouth, or a toothache) UNUSUAL RASH, SWELLING OR PAIN  UNUSUAL VAGINAL DISCHARGE OR ITCHING   Items with * indicate a potential emergency and should be followed up as soon as possible or go to the Emergency Department if any problems should occur.  Please show the CHEMOTHERAPY ALERT CARD or IMMUNOTHERAPY ALERT CARD at check-in  to the Emergency Department and triage nurse.  Should you have questions after your visit or need to cancel or reschedule your appointment, please contact [facility]  Dept: [phone]  and follow the prompts.  Office hours are 8:00 a.m. to 4:30 p.m. Monday - Friday. Please note that voicemails left after 4:00 p.m. may not be returned until the following business day.  We are closed weekends and major holidays. You have access to a nurse at all times for urgent questions. Please call the main number to the clinic Dept: [phone] and follow the prompts.   For any non-urgent questions, you may also contact your provider using MyChart. We now offer e-Visits for anyone 29 and older to request care online for non-urgent symptoms. For details visit [email or web address].   Also download the MyChart app! Go to the app store, search "MyChart", open the app, select [facility], and log in with your MyChart username and password.  Due to Covid, a mask is required upon entering the hospital/clinic. If you do not have a mask, one will be given to you upon arrival. For doctor visits, patients may have 1 support person aged 55 or older with them. For treatment visits, patients cannot have anyone with them due to current Covid guidelines and our immunocompromised population.   Daratumumab; Hyaluronidase Injection What is this medication? DARATUMUMAB; HYALURONIDASE (dar a toom ue mab / hye al ur ON i dase) is a monoclonal antibody. Hyaluronidase is used to improve the effects of daratumumab. It treats certain types of cancer. Some of the cancers treated are multiple myeloma and light-chain amyloidosis.  This medicine may be used for other purposes; ask your health care provider or pharmacist if you have questions. COMMON BRAND NAME(S): DARZALEX FASPRO What should I tell my care team before I take this medication? They need to know if you have any of these conditions: heart  disease infection especially a viral infection such as chickenpox, cold sores, herpes, or hepatitis B lung or breathing disease an unusual or allergic reaction to daratumumab, hyaluronidase, other medicines, foods, dyes, or preservatives pregnant or trying to get pregnant breast-feeding How should I use this medication? This medicine is for injection under the skin. It is given by a health care professional in a hospital or clinic setting. Talk to your pediatrician regarding the use of this medicine in children. Special care may be needed. Overdosage: If you think you have taken too much of this medicine contact a poison control center or emergency room at once. NOTE: This medicine is only for you. Do not share this medicine with others. What if I miss a dose? Keep appointments for follow-up doses as directed. It is important not to miss your dose. Call your doctor or health care professional if you are unable to keep an appointment. What may interact with this medication? Interactions have not been studied. This list may not describe all possible interactions. Give your health care provider a list of all the medicines, herbs, non-prescription drugs, or dietary supplements you use. Also tell them if you smoke, drink alcohol, or use illegal drugs. Some items may interact with your medicine. What should I watch for while using this medication? Your condition will be monitored carefully while you are receiving this medicine. This medicine can cause serious allergic reactions. To reduce your risk, your health care provider may give you other medicine to take before receiving this one. Be sure to follow the directions from your health care provider. This medicine can affect the results of blood tests to match your blood type. These changes can last for up to 6 months after the final dose. Your healthcare provider will do blood tests to match your blood type before you start treatment. Tell all of your  healthcare providers that you are being treated with this medicine before receiving a blood transfusion. This medicine can affect the results of some tests used to determine treatment response; extra tests may be needed to evaluate response. Do not become pregnant while taking this medicine or for 3 months after stopping it. Women should inform their health care provider if they wish to become pregnant or think they might be pregnant. There is a potential for serious side effects to an unborn child. Talk to your health care provider for more information. Do not breast-feed an infant while taking this medicine. What side effects may I notice from receiving this medication? Side effects that you should report to your care team as soon as possible: Allergic reactions--skin rash, itching or hives, swelling of the face, lips, or tongue Blood clot--chest pain, shortness of breath, pain, swelling or warmth in the leg Blurred vision Fast, irregular heartbeat Infection--fever, chills, cough, sore throat, pain or trouble passing urine Injection reactions--dizziness, fast heartbeat, feeling faint or lightheaded, falls, headache, increase in blood pressure, nausea, vomiting, or wheezing or trouble breathing with loud or whistling sounds Low red blood cell counts--trouble breathing, feeling faint, lightheaded or falls, unusually weak or tired Unusual bleeding or bruising Side effects that usually do not require medical attention (report these to your care team if they continue or are  bothersome): Back pain Constipation Diarrhea Pain, tingling, numbness in the hands or feet Pain, redness, or irritation at site where injected Muscle cramp or pain Swelling of the ankles, feet, hands Tiredness Trouble sleeping This list may not describe all possible side effects. Call your doctor for medical advice about side effects. You may report side effects to FDA at 1-800-FDA-1088. Where should I keep my  medication? This drug is given in a hospital or clinic and will not be stored at home. NOTE: This sheet is a summary. It may not cover all possible information. If you have questions about this medicine, talk to your doctor, pharmacist, or health care provider.  2022 Elsevier/Gold Standard (2021-04-19 00:00:00)

## 2021-09-30 NOTE — Progress Notes (Signed)
Per Dr. Lorenso Courier, RN to give Pt 20mg  of PO dexamethasone in inf today.  In the future Pt to take at home prior to appt.  Ok per Dr. Lorenso Courier to only wait 30 minutes from steroid today.

## 2021-10-03 ENCOUNTER — Telehealth: Payer: Self-pay | Admitting: *Deleted

## 2021-10-03 MED ORDER — LENALIDOMIDE 25 MG PO CAPS: 25.0000 mg | ORAL_CAPSULE | Freq: Every day | ORAL | 0 refills | Status: DC

## 2021-10-03 NOTE — Telephone Encounter (Signed)
Oral Oncology Pharmacist Encounter  Patient approved for manufacturer assistance through [place]. Revlimid prescription has been redirected to [facility] by [employer] [facility] for dispensing.   Leron Croak, PharmD, BCPS Hematology/Oncology Clinical Pharmacist [facility] [phone] 10/03/2021 11:57 AM

## 2021-10-03 NOTE — Telephone Encounter (Signed)
Received call from pt. He has received call from [employer]. They advised that he has been approved for the Revlimid financial support and should be receiving his 1st prescription this week. Advised that he can start it when he receives it. His next scheduled treatment here is on Thursday, 10/05/21.

## 2021-10-03 NOTE — Telephone Encounter (Signed)
Patient is approved for Revlimid at no cost from [facility] 10/03/21-08/13/22  [facility] uses [facility] by [facility]  BMS phone number [place] Patient [place] Phone [phone] Fax [phone] 10/03/2021 11:57 AM

## 2021-10-04 NOTE — Telephone Encounter (Signed)
Oral Chemotherapy Pharmacist Encounter  Revlimid start date: pending delivery from [facility] by [facility] - patient will start once received from their pharamcy. He will reach out to their pharmacy today to set up shipment.   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  Counseled patient on administration, dosing, side effects, monitoring, drug-food interactions, safe handling, storage, and disposal.  Patient will take Revlimid 7m capsules, 1 capsule by mouth once daily, without regard to food, with a full glass of water.  Revlimid will be given 21 days on, 7 days off, repeat every 28 days.  Patient will take dexamethasone 431mtablets, 5 tablets (2057mby mouth once weekly with breakfast.  Adverse effects of Revlimid include but are not limited to: nausea, constipation, diarrhea, abdominal pain, rash, fatigue, drug fever, and decreased blood counts.    Reviewed with patient importance of keeping a medication schedule and plan for any missed doses. No barriers to medication adherence identified.  Medication reconciliation performed and medication/allergy list updated.  Patient counseled on importance of daily aspirin 76m45mr VTE prophylaxis. Confirmed with patient that he takes this daily.   Insurance authorization for Revlimid has been obtained. Revlimid prescription is being dispensed from [facility] by [facility] specialty pharmacy as it is a limited distribution medication. Patient is approved for manufacturer assistance through [facility] and is receiving Revlimid at no cost.   All questions answered.  Wesley [name] understanding and appreciation.   Medication education handout will be given to patient in clinic on 10/06/21. Patient knows to call the office with  questions or concerns. Oral Chemotherapy Clinic phone number provided to patient.   RebeLeron CroakarmD, BCPS Hematology/Oncology Clinical Pharmacist [facility] [phone]/2023 11:18 AM

## 2021-10-06 ENCOUNTER — Ambulatory Visit: Payer: Medicare Other

## 2021-10-06 ENCOUNTER — Other Ambulatory Visit: Payer: Self-pay

## 2021-10-06 ENCOUNTER — Inpatient Hospital Stay: Payer: Medicare Other

## 2021-10-06 ENCOUNTER — Other Ambulatory Visit: Payer: Self-pay | Admitting: Hematology and Oncology

## 2021-10-06 ENCOUNTER — Inpatient Hospital Stay (HOSPITAL_BASED_OUTPATIENT_CLINIC_OR_DEPARTMENT_OTHER): Payer: Medicare Other | Admitting: Hematology and Oncology

## 2021-10-06 VITALS — BP 159/90 | HR 77 | Temp 97.9°F | Resp 17 | Wt 197.3 lb

## 2021-10-06 VITALS — BP 138/88 | HR 81 | Temp 98.8°F

## 2021-10-06 DIAGNOSIS — C9000 Multiple myeloma not having achieved remission: Secondary | ICD-10-CM

## 2021-10-06 DIAGNOSIS — Z5111 Encounter for antineoplastic chemotherapy: Secondary | ICD-10-CM | POA: Diagnosis not present

## 2021-10-06 DIAGNOSIS — Z79899 Other long term (current) drug therapy: Secondary | ICD-10-CM | POA: Diagnosis not present

## 2021-10-06 LAB — CBC WITH DIFFERENTIAL (CANCER CENTER ONLY)
Abs Immature Granulocytes: 0 10*3/uL (ref 0.00–0.07)
Basophils Absolute: 0 10*3/uL (ref 0.0–0.1)
Basophils Relative: 0 %
Eosinophils Absolute: 0.4 10*3/uL (ref 0.0–0.5)
Eosinophils Relative: 12 %
HCT: 36.1 % — ABNORMAL LOW (ref 39.0–52.0)
Hemoglobin: 12.1 g/dL — ABNORMAL LOW (ref 13.0–17.0)
Immature Granulocytes: 0 %
Lymphocytes Relative: 30 %
Lymphs Abs: 1.1 10*3/uL (ref 0.7–4.0)
MCH: 34.7 pg — ABNORMAL HIGH (ref 26.0–34.0)
MCHC: 33.5 g/dL (ref 30.0–36.0)
MCV: 103.4 fL — ABNORMAL HIGH (ref 80.0–100.0)
Monocytes Absolute: 0.8 10*3/uL (ref 0.1–1.0)
Monocytes Relative: 24 %
Neutro Abs: 1.2 10*3/uL — ABNORMAL LOW (ref 1.7–7.7)
Neutrophils Relative %: 34 %
Platelet Count: 140 10*3/uL — ABNORMAL LOW (ref 150–400)
RBC: 3.49 MIL/uL — ABNORMAL LOW (ref 4.22–5.81)
RDW: 13.8 % (ref 11.5–15.5)
WBC Count: 3.5 10*3/uL — ABNORMAL LOW (ref 4.0–10.5)
nRBC: 0 % (ref 0.0–0.2)

## 2021-10-06 LAB — CMP (CANCER CENTER ONLY)
ALT: 24 U/L (ref 0–44)
AST: 18 U/L (ref 15–41)
Albumin: 3.3 g/dL — ABNORMAL LOW (ref 3.5–5.0)
Alkaline Phosphatase: 80 U/L (ref 38–126)
Anion gap: 4 — ABNORMAL LOW (ref 5–15)
BUN: 20 mg/dL (ref 8–23)
CO2: 26 mmol/L (ref 22–32)
Calcium: 8.9 mg/dL (ref 8.9–10.3)
Chloride: 102 mmol/L (ref 98–111)
Creatinine: 0.94 mg/dL (ref 0.61–1.24)
GFR, Estimated: >60 mL/min (ref >60)
Glucose, Bld: 142 mg/dL — ABNORMAL HIGH (ref 70–99)
Potassium: 4.2 mmol/L (ref 3.5–5.1)
Sodium: 132 mmol/L — ABNORMAL LOW (ref 135–145)
Total Bilirubin: 0.4 mg/dL (ref 0.3–1.2)
Total Protein: 9.5 g/dL — ABNORMAL HIGH (ref 6.5–8.1)

## 2021-10-06 LAB — LACTATE DEHYDROGENASE: LDH: 130 U/L (ref 98–192)

## 2021-10-06 MED ORDER — ACETAMINOPHEN 325 MG PO TABS
650.0000 mg | ORAL_TABLET | Freq: Once | ORAL | Status: AC
  Administered 2021-10-06: 650 mg via ORAL
  Filled 2021-10-06: qty 2

## 2021-10-06 MED ORDER — DIPHENHYDRAMINE HCL 25 MG PO CAPS
50.0000 mg | ORAL_CAPSULE | Freq: Once | ORAL | Status: AC
  Administered 2021-10-06: 50 mg via ORAL
  Filled 2021-10-06: qty 2

## 2021-10-06 MED ORDER — MONTELUKAST SODIUM 10 MG PO TABS
10.0000 mg | ORAL_TABLET | Freq: Once | ORAL | Status: AC
  Administered 2021-10-06: 10 mg via ORAL
  Filled 2021-10-06: qty 1

## 2021-10-06 MED ORDER — DARATUMUMAB-HYALURONIDASE-FIHJ 1800-30000 MG-UT/15ML ~~LOC~~ SOLN
1800.0000 mg | Freq: Once | SUBCUTANEOUS | Status: AC
  Administered 2021-10-06: 1800 mg via SUBCUTANEOUS
  Filled 2021-10-06: qty 15

## 2021-10-06 MED ORDER — DEXAMETHASONE 4 MG PO TABS
20.0000 mg | ORAL_TABLET | Freq: Once | ORAL | Status: AC
  Administered 2021-10-06: 20 mg via ORAL
  Filled 2021-10-06: qty 5

## 2021-10-06 NOTE — Progress Notes (Signed)
Per Dr. Lorenso Courier ok for treatment today with ANC 1.2 K/ul. Pt. did not take Decadron at home and will receive here prior to treatment. Pt. stayed 1 hour post injection observation and no issues noted. Left via ambulation, no respiratory distress noted.

## 2021-10-06 NOTE — Patient Instructions (Signed)
[facility]  Discharge Instructions: Thank you for choosing [facility] to provide your oncology and hematology care.   If you have a lab appointment with the [facility], please go directly to the [facility] and check in at the registration area.   Wear comfortable clothing and clothing appropriate for easy access to any Portacath or PICC line.   We strive to give you quality time with your provider. You may need to reschedule your appointment if you arrive late (15 or more minutes).  Arriving late affects you and other patients whose appointments are after yours.  Also, if you miss three or more appointments without notifying the office, you may be dismissed from the clinic at the providers discretion.      For prescription refill requests, have your pharmacy contact our office and allow 72 hours for refills to be completed.    Today you received the following chemotherapy and/or immunotherapy agent: Daratumumab (Darzalex  FASPRO)   To help prevent nausea and vomiting after your treatment, we encourage you to take your nausea medication as directed.  BELOW ARE SYMPTOMS THAT SHOULD BE REPORTED IMMEDIATELY: *FEVER GREATER THAN 100.4 F (38 C) OR HIGHER *CHILLS OR SWEATING *NAUSEA AND VOMITING THAT IS NOT CONTROLLED WITH YOUR NAUSEA MEDICATION *UNUSUAL SHORTNESS OF BREATH *UNUSUAL BRUISING OR BLEEDING *URINARY PROBLEMS (pain or burning when urinating, or frequent urination) *BOWEL PROBLEMS (unusual diarrhea, constipation, pain near the anus) TENDERNESS IN MOUTH AND THROAT WITH OR WITHOUT PRESENCE OF ULCERS (sore throat, sores in mouth, or a toothache) UNUSUAL RASH, SWELLING OR PAIN  UNUSUAL VAGINAL DISCHARGE OR ITCHING   Items with * indicate a potential emergency and should be followed up as soon as possible or go to the Emergency Department if any problems should occur.  Please show the CHEMOTHERAPY ALERT CARD or IMMUNOTHERAPY ALERT  CARD at check-in to the Emergency Department and triage nurse.  Should you have questions after your visit or need to cancel or reschedule your appointment, please contact [facility]  Dept: [phone]  and follow the prompts.  Office hours are 8:00 a.m. to 4:30 p.m. Monday - Friday. Please note that voicemails left after 4:00 p.m. may not be returned until the following business day.  We are closed weekends and major holidays. You have access to a nurse at all times for urgent questions. Please call the main number to the clinic Dept: [phone] and follow the prompts.   For any non-urgent questions, you may also contact your provider using MyChart. We now offer e-Visits for anyone 37 and older to request care online for non-urgent symptoms. For details visit [email or web address].   Also download the MyChart app! Go to the app store, search "MyChart", open the app, select [facility], and log in with your MyChart username and password.  Due to Covid, a mask is required upon entering the hospital/clinic. If you do not have a mask, one will be given to you upon arrival. For doctor visits, patients may have 1 support person aged 58 or older with them. For treatment visits, patients cannot have anyone with them due to current Covid guidelines and our immunocompromised population.

## 2021-10-06 NOTE — Progress Notes (Signed)
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 Further workup showed SPEP M-Spike 4.2 g/dL with IgG-Kappa as monoclonal protein, IgG 5908 mg/dL 09/08/2021: Initial evaluation at [facility]. bone marrow biopsy performed, showed 60% abnormal plasma cells in 60% cellular marrow. 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  09/26/2021: transfer care to Dr. Lorenso Courier at [facility].  09/30/2021: Cycle 1 Day 1 of Dara/Dex 10/06/2021: Cycle 1 Day 8 (added revlimid)  Interval History:  Wesley [name] 81 y.o. male with medical history significant for IgG kappa multiple myeloma who presents for a follow up visit. The patient's last visit was on 09/26/2021 at which time he established care. In the interim since the last visit he has started treatment with Dara/Revlimid/dexamethasone.  On exam today Mr. Hodgkin ports he tolerated his first dose of daratumumab quite well.  He notes he did not have any reaction to the medication.  He does not have any injection site redness or itching.  He notes that he also received his Revlimid pills and began taking them this morning.  He unfortunately forgot to take his steroid pills this morning and will be taking them in the chemotherapy room instead.  He notes he did not have any side effects from steroids during his last treatment.  He reports his energy levels are "okay".  He notes he is not as tired as he was before but  his anxiety levels have markedly decreased.  His appetite is good and he is not having any issues with fevers, chills, sweats, nausea, or diarrhea.  Full 10 point ROS is listed below.  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)    CAD (coronary artery disease)    Cholelithiasis    Chronic allergic rhinitis    Coronary atherosclerosis of native coronary artery    Fatty liver    GERD (gastroesophageal reflux disease)    HX 72yr ago, no longer a problem, no meds   High blood pressure    Hypercholesterolemia    Macrocytosis without anemia    Multiple myeloma not having achieved remission ([place] 09/26/2021   OSA on CPAP    Paraesophageal hernia    Partial bowel obstruction (HCC)    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 PT denies significant findings   CARDIAC CATHETERIZATION  2008   COLOSTOMY N/A 10/31/2019   Procedure: COLOSTOMY;  Surgeon: CErroll Luna MD;  [facility]: [facility];  Service: General;  Laterality: N/A;   CYSTOSCOPY W/ URETERAL STENT PLACEMENT Bilateral 10/31/2019   Procedure: CYSTOSCOPY URETERAL STENT PLACEMENT BILATERAL;  Surgeon: DFranchot Gallo MD;  [facility]:  [facility];  Service: Urology;  Laterality: Bilateral;   CYSTOSCOPY WITH STENT PLACEMENT Bilateral 03/11/2020   Procedure: CYSTOSCOPY WITH BILATERAL FIREFLY INJECTION;  Surgeon: Irine Seal, MD;  [facility];  Service: Urology;  Laterality: Bilateral;   EYE SURGERY     BILATERAL CATARACT SURGERY WITH LENS IMPLANTS   FLEXIBLE SIGMOIDOSCOPY N/A 10/29/2019   Procedure: FLEXIBLE SIGMOIDOSCOPY;   Surgeon: Jerene Bears, MD;  [facility];  Service: Gastroenterology;  Laterality: N/A;   INCISIONAL HERNIA REPAIR N/A 10/13/2020   Procedure: OPEN INCISIONAL HERNIA REPAIR WITH MESH;  Surgeon: [name], MD;  Location: [place];  Service: General;  Laterality: N/A;   INSERTION OF MESH N/A 09/08/2016   Procedure: INSERTION OF MESH;  Surgeon: Jackolyn Confer, MD;  [facility];  Service: General;  Laterality: N/A;   IR RADIOLOGIST EVAL & MGMT  11/10/2020   IR RADIOLOGIST EVAL & MGMT  11/25/2020   LAPAROSCOPIC SIGMOID COLECTOMY N/A 10/31/2019   Procedure: DIAGNOSTIC LAPAROSCOPY; EXPLORATORY LAPAROTOMY; SIGMOID COLECTOMY;  Surgeon: Erroll Luna, MD;  [facility];  Service: General;  Laterality: N/A;   right rotator cuff     SUBMUCOSAL TATTOO INJECTION  10/29/2019   Procedure: SUBMUCOSAL TATTOO INJECTION;  Surgeon: Jerene Bears, MD;  [facility];  Service: Gastroenterology;;   TONSILLECTOMY     UMBILICAL HERNIA REPAIR N/A 09/08/2016   Procedure: OPEN UMBILICAL HERNIA REPAIR WITH MESH;  Surgeon: Jackolyn Confer, MD;  [facility];  Service: General;  Laterality: N/A;   VENTRAL HERNIA REPAIR N/A 02/03/2021   Procedure: LAPAROSCOPIC VENTRAL HERNIA REPAIR WITH MESH;  Surgeon: [name], MD;  Location: [place];  Service: General;  Laterality: N/A;   XI ROBOTIC ASSISTED COLOSTOMY TAKEDOWN N/A 03/11/2020   Procedure: ROBOTIC ASSISTED COLOSTOMY REVERSAL, RIGID PROCTOSCOPY;  Surgeon: Leighton Ruff, MD;  [facility];  Service: General;  Laterality: N/A;    SOCIAL HISTORY: Social History   Socioeconomic History   Marital status: Married    Spouse name: Zacchaeus Halm    Number of children: 2   Years of education: 12   Highest education level: Master's degree (e.g., MA, MS, MEng, MEd, MSW, MBA)  Occupational History   Occupation: Retired   Tobacco Use   Smoking status: Never   Smokeless tobacco: Never  Vaping Use   Vaping Use: Never used  Substance and  Sexual Activity   Alcohol use: Yes    Alcohol/week: 17.0 standard drinks    Types: 3 Glasses of wine, 14 Standard drinks or equivalent per week   Drug use: No   Sexual activity: Not Currently  Other Topics Concern   Not on file  Social History Narrative   Married. 2 children 52 in 72 in 2022- both went to [employer]. 5 grandkids- all girls from oldest 28 looking premed [employer], 48 soph [employer], 51 56 year olds, 46 year olds.       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 [employer]: fishing, [employer] football and basketball, [place], [place] Strain: Low Risk    Difficulty of [place]: Not hard at [place]: No [employer]   Worried About Charity fundraiser in [place]: Never true   Arboriculturist in the Last Year: Never true  Transportation  Needs: No Transportation Needs   Lack of Transportation (Medical): No   Lack of Transportation (Non-Medical): No  Physical Activity: Inactive   Days of Exercise per Week: 0 days   Minutes of Exercise per Session: 0 min  Stress: No Stress Concern Present   Feeling of Stress : Not at all  Social Connections: Socially Integrated   Frequency of Communication with Friends and Family: More than three times a week   Frequency of Social Gatherings with Friends and Family: Once a week   Attends Religious Services: More than 4 times per year   Active Member of [employer] or Organizations: Yes   Attends Music therapist: More than 4 times per year   Marital Status: Married  Human resources officer Violence: Not At Risk   Fear of Current or Ex-Partner: No   Emotionally Abused: No   Physically Abused: No   Sexually Abused: No    FAMILY HISTORY: Family History  Problem Relation Age of Onset   Diabetes Mother    Heart failure Mother        around  67   CAD Father        70   Melanoma Brother 34   Healthy Daughter    Healthy Son    Colon cancer Neg Hx    Esophageal cancer Neg Hx    Rectal cancer Neg Hx    Stomach cancer Neg Hx     ALLERGIES:  is allergic to demerol, promethazine hcl, and ativan [lorazepam].  MEDICATIONS:  Current Outpatient Medications  Medication Sig Dispense Refill   acetaminophen (TYLENOL) 500 MG tablet Take 500 mg by mouth every 6 (six) hours as needed.     acyclovir (ZOVIRAX) 400 MG tablet Take 400 mg by mouth in the morning and at bedtime.     allopurinol (ZYLOPRIM) 300 MG tablet Take 1 tablet (300 mg total) by mouth daily. 60 tablet 1   aspirin EC 81 MG tablet Take 81 mg by mouth daily. Swallow whole.     atorvastatin (LIPITOR) 80 MG tablet Take 1 tablet by mouth daily.     Cholecalciferol (VITAMIN D3) 25 MCG (1000 UT) CAPS Take 1,000 Units by mouth daily.     dexamethasone (DECADRON) 4 MG tablet Take 5 tablets (20 mg total) by mouth once a week. 20 tablet 3   fexofenadine (ALLEGRA) 180 MG tablet      finasteride (PROSCAR) 5 MG tablet Take 5 mg by mouth daily.     Ibuprofen 200 MG CAPS      lenalidomide (REVLIMID) 25 MG capsule Take 1 capsule (25 mg total) by mouth daily. Take for 21 days, then none for 7 days. Repeat every 28 days. [employer] Auth # [id number]; Date Obtained 09/26/21 21 capsule 0   lisinopril (ZESTRIL) 10 MG tablet Take 1 tablet (10 mg total) by mouth daily. 90 tablet 3   Magnesium Oxide 400 MG CAPS      melatonin 5 MG TABS      Omega-3 Fatty Acids (FISH OIL) 1200 MG CAPS Take 1,200 mg by mouth daily.     ondansetron (ZOFRAN) 8 MG tablet Take 1 tablet (8 mg total) by mouth every 8 (eight) hours as needed. 30 tablet 0   pantoprazole (PROTONIX) 40 MG tablet Take by mouth.     prochlorperazine (COMPAZINE) 10 MG tablet Take 1 tablet (10 mg total) by mouth every 6 (six) hours as needed for nausea or vomiting. 30 tablet 0   vitamin B-12 (CYANOCOBALAMIN) 1000 MCG tablet  Take 1,000 mcg by mouth  daily.     No current facility-administered medications for this visit.   Facility-Administered Medications Ordered in Other Visits  Medication Dose Route Frequency Provider Last Rate Last Admin   acetaminophen (TYLENOL) tablet 650 mg  650 mg Oral Once Orson Slick, MD       daratumumab-hyaluronidase-fihj (DARZALEX FASPRO) 1800-30000 MG-UT/15ML chemo SQ injection 1,800 mg  1,800 mg Subcutaneous Once Orson Slick, MD       dexamethasone (DECADRON) tablet 20 mg  20 mg Oral Once Orson Slick, MD       diphenhydrAMINE (BENADRYL) capsule 50 mg  50 mg Oral Once Orson Slick, MD       montelukast (SINGULAIR) tablet 10 mg  10 mg Oral Once Orson Slick, MD        REVIEW OF SYSTEMS:   Constitutional: ( - ) fevers, ( - )  chills , ( - ) night sweats Eyes: ( - ) blurriness of vision, ( - ) double vision, ( - ) watery eyes Ears, nose, mouth, throat, and face: ( - ) mucositis, ( - ) sore throat Respiratory: ( - ) cough, ( - ) dyspnea, ( - ) wheezes Cardiovascular: ( - ) palpitation, ( - ) chest discomfort, ( - ) lower extremity swelling Gastrointestinal:  ( - ) nausea, ( - ) heartburn, ( - ) change in bowel habits Skin: ( - ) abnormal skin rashes Lymphatics: ( - ) new lymphadenopathy, ( - ) easy bruising Neurological: ( - ) numbness, ( - ) tingling, ( - ) new weaknesses Behavioral/Psych: ( - ) mood change, ( - ) new changes  All other systems were reviewed with the patient and are negative.  PHYSICAL EXAMINATION: ECOG PERFORMANCE STATUS: 1 - Symptomatic but completely ambulatory  Vitals:   10/06/21 0832  BP: (!) 159/90  Pulse: 77  Resp: 17  Temp: 97.9 F (36.6 C)  SpO2: 96%   Filed Weights   10/06/21 0832  Weight: 197 lb 4.8 oz (89.5 kg)    GENERAL: Well-appearing elderly Caucasian male, alert, no distress and comfortable SKIN: skin color, texture, turgor are normal, no rashes or significant lesions EYES: conjunctiva are pink and non-injected, sclera  clear LUNGS: clear to auscultation and percussion with normal breathing effort HEART: regular rate & rhythm and no murmurs and no lower extremity edema Musculoskeletal: no cyanosis of digits and no clubbing  PSYCH: alert & oriented x 3, fluent speech NEURO: no focal motor/sensory deficits  LABORATORY DATA:  I have reviewed the data as listed CBC Latest Ref Rng & Units 10/06/2021 09/26/2021 08/01/2021  WBC 4.0 - 10.5 K/uL 3.5(L) 5.4 5.4  Hemoglobin 13.0 - 17.0 g/dL 12.1(L) 12.4(L) 12.5(L)  Hematocrit 39.0 - 52.0 % 36.1(L) 36.3(L) 36.4(L)  Platelets 150 - 400 K/uL 140(L) 184 184.0    CMP Latest Ref Rng & Units 10/06/2021 09/26/2021 08/01/2021  Glucose 70 - 99 mg/dL 142(H) 92 124(H)  BUN 8 - 23 mg/dL _0 Creatinine 0.61 - 1.24 mg/dL 0.94 0.82 0.89  Sodium 135 - 145 mmol/L 132(L) 131(L) 132(L)  Potassium 3.5 - 5.1 mmol/L 4.2 4.1 4.0  Chloride 98 - 111 mmol/L 102 103 102  CO2 22 - 32 mmol/L _1 Calcium 8.9 - 10.3 mg/dL 8.9 8.7(L) 8.8  Total Protein 6.5 - 8.1 g/dL 9.5(H) 11.2(H) 10.4(HH)  Total Bilirubin 0.3 - 1.2 mg/dL 0.4 0.6 0.5  Alkaline Phos 38 -  126 U/L 80 83 68  AST 15 - 41 U/L _0 ALT 0 - 44 U/L _1 Lab Results  Component Value Date   MPROTEIN 5.0 (H) 09/26/2021   MPROTEIN 4.2 (H) 08/01/2021   Lab Results  Component Value Date   KPAFRELGTCHN 449.4 (H) 09/26/2021   LAMBDASER 6.4 09/26/2021   KAPLAMBRATIO 70.22 (H) 09/26/2021    RADIOGRAPHIC STUDIES: No results found.  ASSESSMENT & PLAN  Wesley [name] 81 y.o. male with medical history significant for IgG kappa multiple myeloma who presents for a follow up visit.   After review of the labs, review of the records, and discussion with the patient the patients findings are most consistent with newly diagnosed IgG kappa multiple myeloma.  The patient meets diagnostic criteria by having greater than 60% plasma cells in his bone marrow.  At this time we are in agreement with [facility] in [place], Revlimid, and dexamethasone as the treatment of choice.   Previously we discussed the concept of comanaged care.  Comanaged care is when the patient has a local primary provider who administers local support and therapy while expert advice and treatment recommendations are rendered by a cancer specialist at a large academic center.  In this arrangement we provide local support, labs, treatment, and emergency visits, however the major decisions regarding the course of treatment are decided by a physician at an academic medical center.  The patient voiced his understanding of comanaged care and was agreeable to proceeding forward with this care model.   The regimen of choice for this patient will be Dara, Revlimid, and dexamethasone.  We will start with weekly treatments for the first 2 cycles.  After that for an additional 4 cycles he will be getting it every other week.  After cycle 6 the patient will then continue with monthly treatment.  Today we discussed that the goal of treatment is to get the patient into a complete remission which is feasible.  He voices understanding of this plan moving forward.   # IgG Kappa Multiple Myeloma --Patient meets diagnostic criteria for multiple myeloma based on the presence of 60% plasma cells in the bone marrow --Pretreatment phenotype RBC as well as type and screen ordered  --Cycle 1 Day 1of Dara/Rev/Dex started on 09/30/2021. He started without revlimid which was added on 10/06/2021 (Cycle 1 Day 8)  Plan:  -- Today is Cycle 1 Day 8 of Dara/Rev/Dex -- Labs to include weekly CBC, CMP, LDH.  Additionally monthly SPEP and serum free light chains. -- Return to clinic in 2 weeks time with interval weekly treatments.   #Supportive Care -- chemotherapy education complete  -- port placement not required.  --ASA 81 mg p.o. daily for thromboprophylaxis on Revlimid. -- zofran 78m q8H PRN and compazine 16mPO q6H for nausea -- acyclovir 40046mPO BID for VCZ prophylaxis -- allopurinol 300m55m daily for TLS prophylaxis -- zometa to start as soon as is feasible.  -- no pain medication required at this time.   No orders of the defined types were placed in this encounter.   All questions were answered. The patient knows to call the clinic with any problems, questions or concerns.  A total of more than 30 minutes were spent on this encounter with face-to-face time and non-face-to-face time, including preparing to see the patient, ordering tests and/or medications, counseling the patient and coordination of care as outlined above.   Danialle Dement T.  Lorenso Courier, MD Department of Hematology/Oncology [place] at [facility] Phone: [phone] Pager: [phone] Email: [email or web address]  10/06/2021 10:02 AM

## 2021-10-07 ENCOUNTER — Ambulatory Visit (HOSPITAL_COMMUNITY): Payer: Medicare Other

## 2021-10-07 LAB — KAPPA/LAMBDA LIGHT CHAINS
Kappa free light chain: 61.7 mg/L — ABNORMAL HIGH (ref 3.3–19.4)
Kappa, lambda light chain ratio: 16.24 — ABNORMAL HIGH (ref 0.26–1.65)
Lambda free light chains: 3.8 mg/L — ABNORMAL LOW (ref 5.7–26.3)

## 2021-10-10 ENCOUNTER — Inpatient Hospital Stay: Payer: Medicare Other | Admitting: Internal Medicine

## 2021-10-10 ENCOUNTER — Other Ambulatory Visit: Payer: Self-pay | Admitting: Radiation Therapy

## 2021-10-10 LAB — MULTIPLE MYELOMA PANEL, SERUM
Albumin SerPl Elph-Mcnc: 3.6 g/dL (ref 2.9–4.4)
Albumin/Glob SerPl: 0.7 (ref 0.7–1.7)
Alpha 1: 0.3 g/dL (ref 0.0–0.4)
Alpha2 Glob SerPl Elph-Mcnc: 0.7 g/dL (ref 0.4–1.0)
B-Globulin SerPl Elph-Mcnc: 0.9 g/dL (ref 0.7–1.3)
Gamma Glob SerPl Elph-Mcnc: 3.7 g/dL — ABNORMAL HIGH (ref 0.4–1.8)
Globulin, Total: 5.6 g/dL — ABNORMAL HIGH (ref 2.2–3.9)
IgA: 47 mg/dL — ABNORMAL LOW (ref 61–437)
IgG (Immunoglobin G), Serum: 4979 mg/dL — ABNORMAL HIGH (ref 603–1613)
IgM (Immunoglobulin M), Srm: 22 mg/dL (ref 15–143)
M Protein SerPl Elph-Mcnc: 3.5 g/dL — ABNORMAL HIGH
Total Protein ELP: 9.2 g/dL — ABNORMAL HIGH (ref 6.0–8.5)

## 2021-10-11 ENCOUNTER — Encounter: Payer: Self-pay | Admitting: Family Medicine

## 2021-10-11 ENCOUNTER — Other Ambulatory Visit: Payer: Self-pay

## 2021-10-11 MED ORDER — LISINOPRIL 10 MG PO TABS: 10.0000 mg | ORAL_TABLET | Freq: Every day | ORAL | 3 refills | Status: DC

## 2021-10-12 ENCOUNTER — Ambulatory Visit: Payer: Medicare Other | Admitting: Family Medicine

## 2021-10-14 ENCOUNTER — Inpatient Hospital Stay: Payer: Medicare Other | Attending: Hematology and Oncology

## 2021-10-14 ENCOUNTER — Inpatient Hospital Stay: Payer: Medicare Other

## 2021-10-14 ENCOUNTER — Other Ambulatory Visit: Payer: Self-pay

## 2021-10-14 VITALS — BP 123/68 | HR 73 | Temp 97.7°F | Resp 16 | Ht 68.0 in | Wt 188.5 lb

## 2021-10-14 DIAGNOSIS — C9000 Multiple myeloma not having achieved remission: Secondary | ICD-10-CM

## 2021-10-14 DIAGNOSIS — D333 Benign neoplasm of cranial nerves: Secondary | ICD-10-CM | POA: Diagnosis not present

## 2021-10-14 DIAGNOSIS — Z5111 Encounter for antineoplastic chemotherapy: Secondary | ICD-10-CM | POA: Insufficient documentation

## 2021-10-14 DIAGNOSIS — Z79899 Other long term (current) drug therapy: Secondary | ICD-10-CM | POA: Diagnosis not present

## 2021-10-14 LAB — CBC WITH DIFFERENTIAL (CANCER CENTER ONLY)
Abs Immature Granulocytes: 0.02 10*3/uL (ref 0.00–0.07)
Basophils Absolute: 0 10*3/uL (ref 0.0–0.1)
Basophils Relative: 0 %
Eosinophils Absolute: 0.6 10*3/uL — ABNORMAL HIGH (ref 0.0–0.5)
Eosinophils Relative: 9 %
HCT: 36.4 % — ABNORMAL LOW (ref 39.0–52.0)
Hemoglobin: 12.3 g/dL — ABNORMAL LOW (ref 13.0–17.0)
Immature Granulocytes: 0 %
Lymphocytes Relative: 8 %
Lymphs Abs: 0.5 10*3/uL — ABNORMAL LOW (ref 0.7–4.0)
MCH: 34.6 pg — ABNORMAL HIGH (ref 26.0–34.0)
MCHC: 33.8 g/dL (ref 30.0–36.0)
MCV: 102.2 fL — ABNORMAL HIGH (ref 80.0–100.0)
Monocytes Absolute: 0.6 10*3/uL (ref 0.1–1.0)
Monocytes Relative: 10 %
Neutro Abs: 4.7 10*3/uL (ref 1.7–7.7)
Neutrophils Relative %: 73 %
Platelet Count: 169 10*3/uL (ref 150–400)
RBC: 3.56 MIL/uL — ABNORMAL LOW (ref 4.22–5.81)
RDW: 13.8 % (ref 11.5–15.5)
WBC Count: 6.5 10*3/uL (ref 4.0–10.5)
nRBC: 0 % (ref 0.0–0.2)

## 2021-10-14 LAB — CMP (CANCER CENTER ONLY)
ALT: 27 U/L (ref 0–44)
AST: 21 U/L (ref 15–41)
Albumin: 3.4 g/dL — ABNORMAL LOW (ref 3.5–5.0)
Alkaline Phosphatase: 103 U/L (ref 38–126)
Anion gap: 4 — ABNORMAL LOW (ref 5–15)
BUN: 26 mg/dL — ABNORMAL HIGH (ref 8–23)
CO2: 25 mmol/L (ref 22–32)
Calcium: 8.8 mg/dL — ABNORMAL LOW (ref 8.9–10.3)
Chloride: 103 mmol/L (ref 98–111)
Creatinine: 1.05 mg/dL (ref 0.61–1.24)
GFR, Estimated: >60 mL/min (ref >60)
Glucose, Bld: 111 mg/dL — ABNORMAL HIGH (ref 70–99)
Potassium: 4.5 mmol/L (ref 3.5–5.1)
Sodium: 132 mmol/L — ABNORMAL LOW (ref 135–145)
Total Bilirubin: 0.6 mg/dL (ref 0.3–1.2)
Total Protein: 8.2 g/dL — ABNORMAL HIGH (ref 6.5–8.1)

## 2021-10-14 LAB — LACTATE DEHYDROGENASE: LDH: 113 U/L (ref 98–192)

## 2021-10-14 MED ORDER — MONTELUKAST SODIUM 10 MG PO TABS
10.0000 mg | ORAL_TABLET | Freq: Once | ORAL | Status: AC
  Administered 2021-10-14: 10 mg via ORAL
  Filled 2021-10-14: qty 1

## 2021-10-14 MED ORDER — DIPHENHYDRAMINE HCL 25 MG PO CAPS
50.0000 mg | ORAL_CAPSULE | Freq: Once | ORAL | Status: AC
  Administered 2021-10-14: 50 mg via ORAL
  Filled 2021-10-14: qty 2

## 2021-10-14 MED ORDER — ACETAMINOPHEN 325 MG PO TABS
650.0000 mg | ORAL_TABLET | Freq: Once | ORAL | Status: AC
  Administered 2021-10-14: 650 mg via ORAL
  Filled 2021-10-14: qty 2

## 2021-10-14 MED ORDER — DARATUMUMAB-HYALURONIDASE-FIHJ 1800-30000 MG-UT/15ML ~~LOC~~ SOLN
1800.0000 mg | Freq: Once | SUBCUTANEOUS | Status: AC
  Administered 2021-10-14: 1800 mg via SUBCUTANEOUS
  Filled 2021-10-14: qty 15

## 2021-10-14 NOTE — Patient Instructions (Signed)
[facility]ICAL [facility]COL[facility]   Discharge Instructions: Thank you for choosing [facility] to provide your oncology and hematology care.   If you have a lab appointment with the Cancer Center, please go directly to the Cancer Center and check in at the registration area.   Wear comfortable clothing and clothing appropriate for easy access to any Portacath or PICC line.   We strive to give you quality time with your provider. You may need to reschedule your appointment if you arrive late (15 or more minutes).  Arriving late affects you and other patients whose appointments are after yours.  Also, if you miss three or more appointments without notifying the office, you may be dismissed from the clinic at the provider's discretion.      For prescription refill requests, have your pharmacy contact our office and allow 72 hours for refills to be completed.    Today you received the following chemotherapy and/or immunotherapy agents: daratumumab-hyaluronidase-fihj      To help prevent nausea and vomiting after your treatment, we encourage you to take your nausea medication as directed.  BELOW ARE SYMPTOMS THAT SHOULD BE REPORTED IMMEDIATELY: *FEVER GREATER THAN 100.4 F (38 C) OR HIGHER *CHILLS OR SWEATING *NAUSEA AND VOMITING THAT IS NOT CONTROLLED WITH YOUR NAUSEA MEDICATION *UNUSUAL SHORTNESS OF BREATH *UNUSUAL BRUISING OR BLEEDING *URINARY PROBLEMS (pain or burning when urinating, or frequent urination) *BOWEL PROBLEMS (unusual diarrhea, constipation, pain near the anus) TENDERNESS IN MOUTH AND THROAT WITH OR WITHOUT PRESENCE OF ULCERS (sore throat, sores in mouth, or a toothache) UNUSUAL RASH, SWELLING OR PAIN  UNUSUAL VAGINAL DISCHARGE OR ITCHING   Items with * indicate a potential emergency and should be followed up as soon as possible or go to the Emergency Department if any problems should occur.  Please show the CHEMOTHERAPY ALERT CARD or IMMUNOTHERAPY  ALERT CARD at check-in to the Emergency Department and triage nurse.  Should you have questions after your visit or need to cancel or reschedule your appointment, please contact [facility]  Dept: [phone]  and follow the prompts.  Office hours are 8:00 a.m. to 4:30 p.m. Monday - Friday. Please note that voicemails left after 4:00 p.m. may not be returned until the following business day.  We are closed weekends and major holidays. You have access to a nurse at all times for urgent questions. Please call the main number to the clinic Dept: [phone] and follow the prompts.   For any non-urgent questions, you may also contact your provider using MyChart. We now offer e-Visits for anyone 18 and older to request care online for non-urgent symptoms. For details visit [email or web address].   Also download the MyChart app! Go to the app store, search "MyChart", open the app, select [facility], and log in with your MyChart username and password.  Due to Covid, a mask is required upon entering the hospital/clinic. If you do not have a mask, one will be given to you upon arrival. For doctor visits, patients may have 1 support person aged 18 or older with them. For treatment visits, patients cannot have anyone with them due to current Covid guidelines and our immunocompromised population.  

## 2021-10-14 NOTE — Progress Notes (Signed)
Patient reports he took 20mg  of dexamethasone at home prior to arrival to clinic for infusion. ?

## 2021-10-18 ENCOUNTER — Other Ambulatory Visit: Payer: Self-pay

## 2021-10-18 ENCOUNTER — Ambulatory Visit (INDEPENDENT_AMBULATORY_CARE_PROVIDER_SITE_OTHER): Payer: Medicare Other | Admitting: Podiatry

## 2021-10-18 DIAGNOSIS — B351 Tinea unguium: Secondary | ICD-10-CM | POA: Diagnosis not present

## 2021-10-18 DIAGNOSIS — M79674 Pain in right toe(s): Secondary | ICD-10-CM

## 2021-10-18 DIAGNOSIS — M79675 Pain in left toe(s): Secondary | ICD-10-CM | POA: Diagnosis not present

## 2021-10-18 DIAGNOSIS — G620 Drug-induced polyneuropathy: Secondary | ICD-10-CM

## 2021-10-18 DIAGNOSIS — T451X5A Adverse effect of antineoplastic and immunosuppressive drugs, initial encounter: Secondary | ICD-10-CM

## 2021-10-18 DIAGNOSIS — L84 Corns and callosities: Secondary | ICD-10-CM

## 2021-10-19 ENCOUNTER — Ambulatory Visit (HOSPITAL_COMMUNITY)
Admission: RE | Admit: 2021-10-19 | Discharge: 2021-10-19 | Disposition: A | Discharge status: Home / Self Care | Payer: Medicare Other | Source: Ambulatory Visit | Attending: Internal Medicine | Admitting: Internal Medicine

## 2021-10-19 DIAGNOSIS — G9389 Other specified disorders of brain: Secondary | ICD-10-CM | POA: Diagnosis not present

## 2021-10-19 DIAGNOSIS — D333 Benign neoplasm of cranial nerves: Secondary | ICD-10-CM | POA: Insufficient documentation

## 2021-10-19 IMAGING — MR MR HEAD WO/W CM
13 series · 48 of 48 positions shown · IV contrast (gadavist)
Comparison: [DATE].

CLINICAL DATA: Vestibular schwannoma, assess treatment response

EXAM:
MRI HEAD WITHOUT AND WITH CONTRAST
TECHNIQUE: Multiplanar, multiecho pulse sequences of the brain and surrounding
structures were obtained without and with intravenous contrast.
CONTRAST:  8mL GADAVIST GADOBUTROL 1 MMOL/ML IV SOLN

[Series 5: DWI · axial · 3.0mm · 1.36mm/px · z∈[-7,+155]mm · 9 of 111 slices shown (1 of 2)]
[im 1/111]
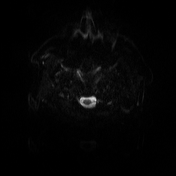
[im 14/111]
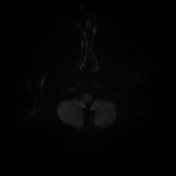
[im 28/111]
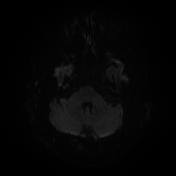
[im 42/111]
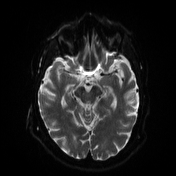
[im 56/111]
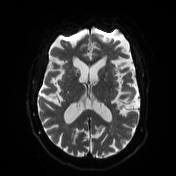
[im 69/111]
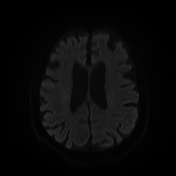
[im 83/111]
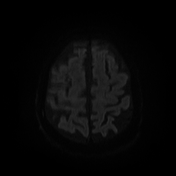
[im 97/111]
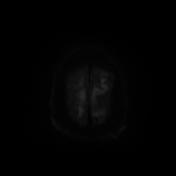
[im 111/111]
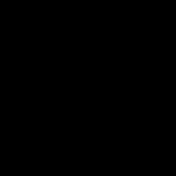

[Series 6: DWI · axial · 3.0mm · 1.36mm/px · z∈[-7,+152]mm · 4 of 55 slices shown (2 of 2)]
[im 1/55]
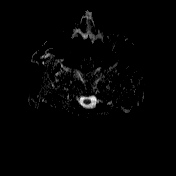
[im 19/55]
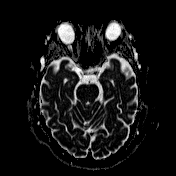
[im 37/55]
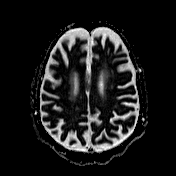
[im 55/55]
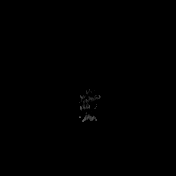

[Series 7: T1 · sagittal · 5.0mm · 0.75mm/px · 2 of 30 slices shown (1 of 3)]
[im 1/30]
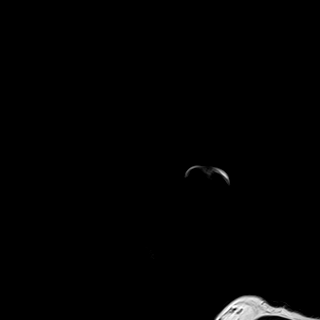
[im 30/30]
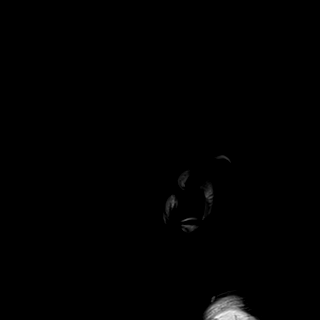

[Series 8: T2 · axial · 5.0mm · 0.57mm/px · z∈[-20,+145]mm · 2 of 29 slices shown]
[im 1/29]
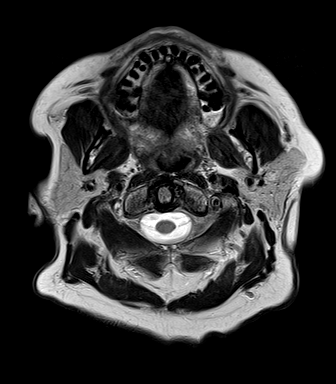
[im 29/29]
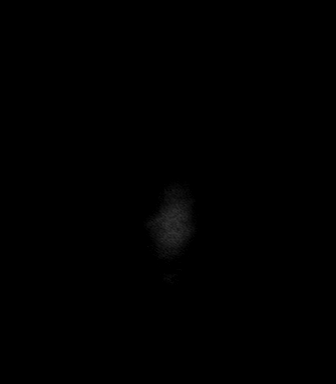

[Series 9: FLAIR · axial · 3.0mm · 0.69mm/px · z∈[-23,+148]mm · 2 of 30 slices shown]
[im 1/30]
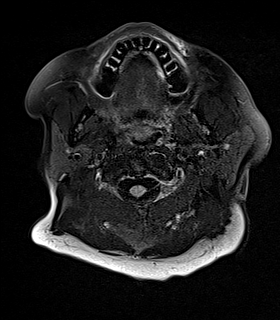
[im 30/30]
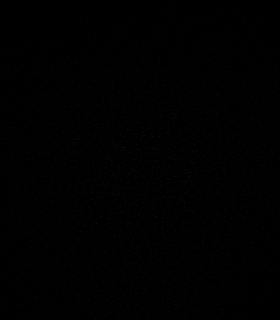

[Series 10: swi_images · axial · 3.0mm · 0.75mm/px · z∈[-23,+151]mm · 5 of 60 slices shown]
[im 1/60]
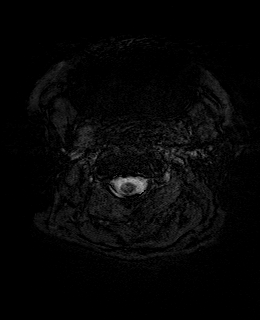
[im 15/60]
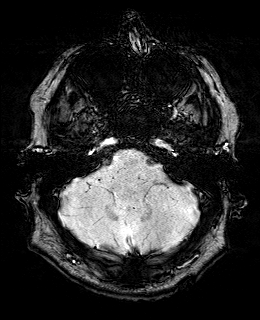
[im 30/60]
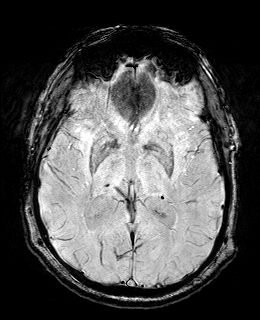
[im 45/60]
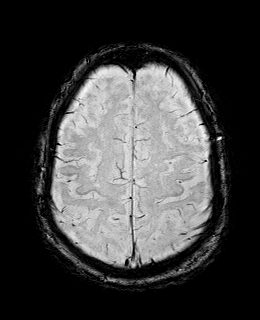
[im 60/60]
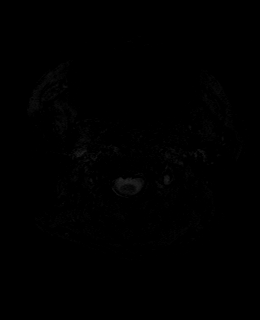

[Series 12: bSSFP · axial · 0.6mm · 0.56mm/px · z∈[-6,+29]mm · 5 of 60 slices shown]
[im 1/60]
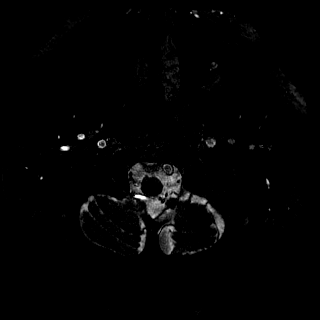
[im 15/60]
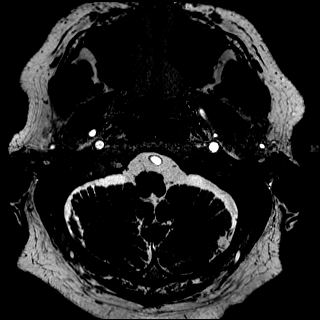
[im 30/60]
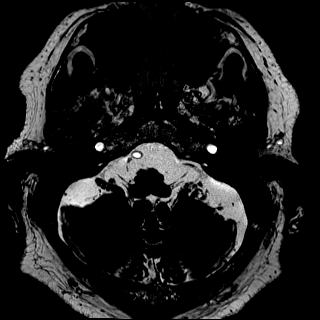
[im 45/60]
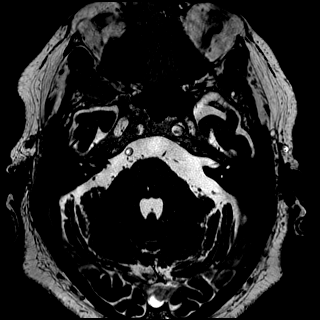
[im 60/60]
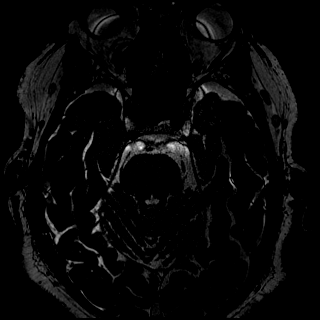

[Series 13: T1 · axial · 3.0mm · 0.31mm/px · 1 of 15 slices shown (2 of 3)]
[im 1/15]
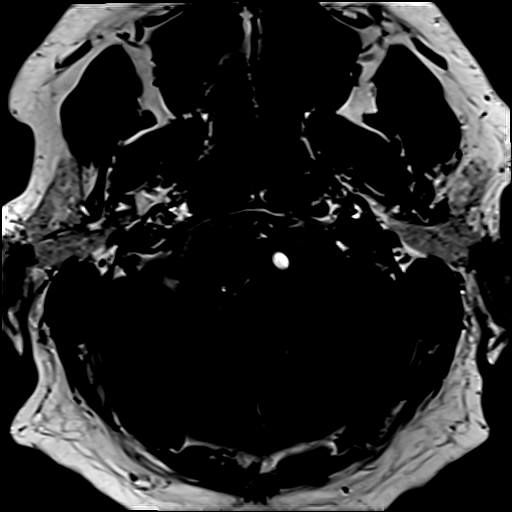

[Series 14: T1 · coronal · 3.0mm · 0.50mm/px · 1 of 15 slices shown (3 of 3)]
[im 1/15]
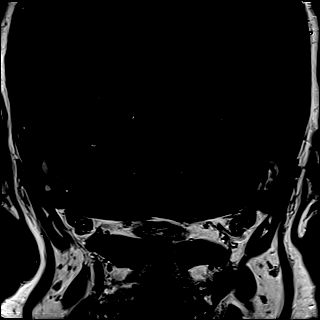

[Series 15: T1 post-contrast · coronal · 3.0mm · 0.50mm/px · 1 of 15 slices shown (1 of 3)]
[im 1/15]
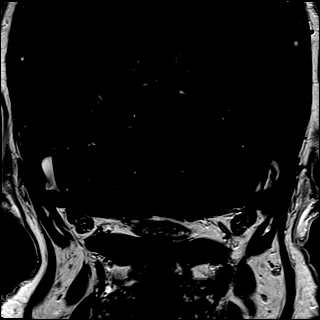

[Series 16: T1 post-contrast · axial · 3.0mm · 0.31mm/px · 1 of 15 slices shown (2 of 3)]
[im 1/15]
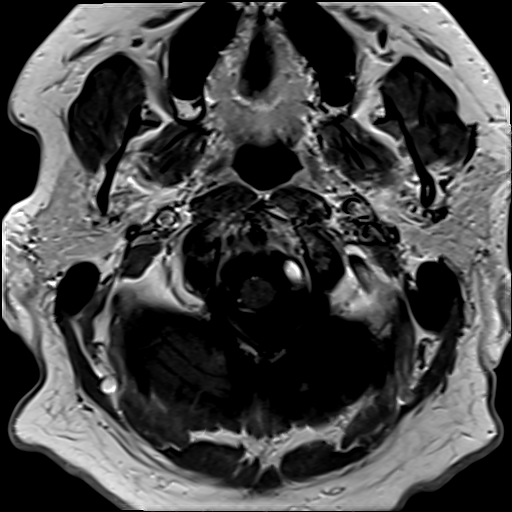

[Series 17: T1 post-contrast · axial · 1.0mm · 0.94mm/px · z∈[-21,+151]mm · 13 of 176 slices shown (3 of 3)]
[im 1/176]
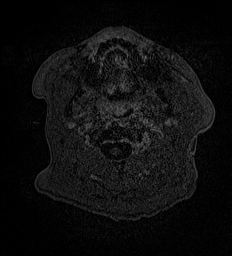
[im 15/176]
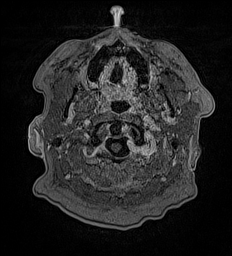
[im 30/176]
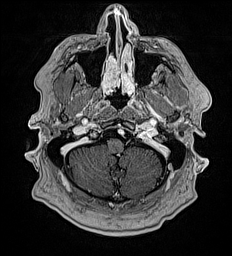
[im 44/176]
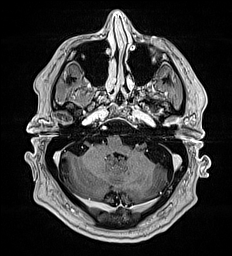
[im 59/176]
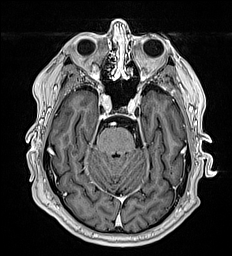
[im 73/176]
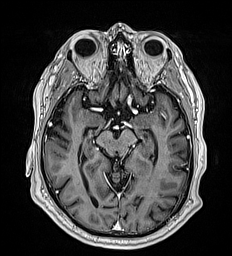
[im 88/176]
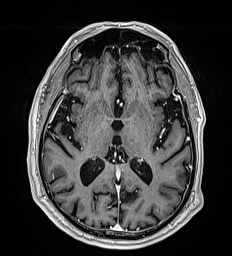
[im 103/176]
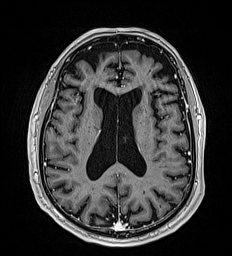
[im 117/176]
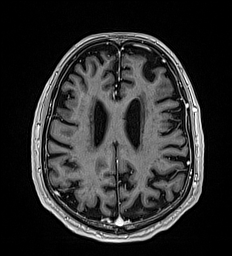
[im 132/176]
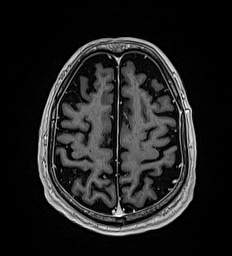
[im 146/176]
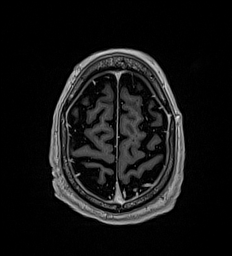
[im 161/176]
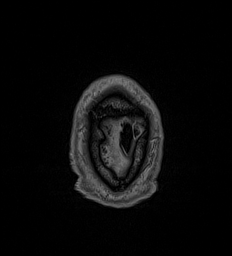
[im 176/176]
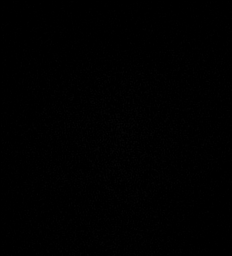

[Series 100: <mpr range> · axial · 0.6mm · 0.37mm/px · z∈[+12,+89]mm · 2 of 27 slices shown]
[im 1/27]
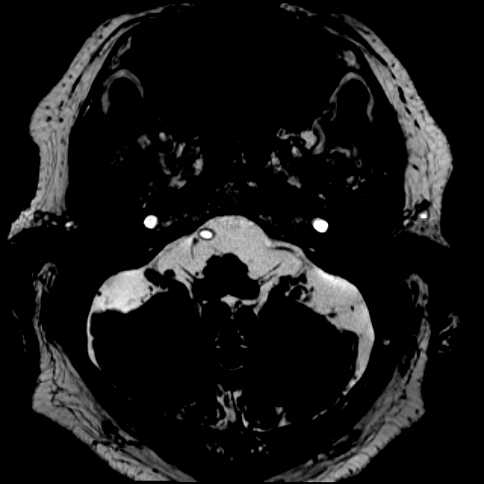
[im 27/27]
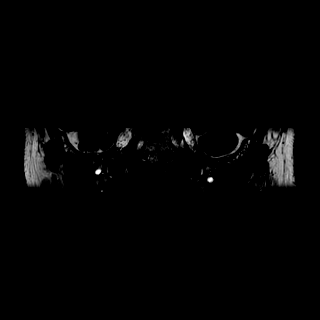

[48 of 48 positions shown; findings below may reference images not displayed]

FINDINGS: Brain: Redemonstrated enhancing lesion in the right IAC, along the
course of the right seventh and eighth cranial nerves, measuring up
to 6 x 9 x 5 mm (AP x TR x CC) (series 16, image 10 and series 15,
image 8), previously 5 x 8 x 5 mm when remeasured similarly. The
lesion does not extend into the cochlea. Otherwise normal appearance
of the right inner ear. Normal appearance of the left IAC.

No restricted diffusion to suggest acute or subacute infarct. No
acute hemorrhage, mass, mass effect, or midline shift. No abnormal
parenchymal or meningeal enhancement. No hydrocephalus or
extra-axial collection. T2 hyperintense signal in the
periventricular white matter, likely the sequela of mild chronic
small vessel ischemic disease.

Vascular: Normal flow voids.

Skull and upper cervical spine: Normal marrow signal.

Sinuses/Orbits: Negative.  Status post bilateral lens replacements.

Other: The mastoids are well aerated.
IMPRESSION: Slight interval increase in the size of a right vestibular
schwannoma. No other acute intracranial process.

## 2021-10-19 MED ORDER — GADOBUTROL 1 MMOL/ML IV SOLN
8.0000 mL | Freq: Once | INTRAVENOUS | Status: AC | PRN
  Administered 2021-10-19: 8 mL via INTRAVENOUS

## 2021-10-19 NOTE — Progress Notes (Signed)
?  Subjective:  ?Patient ID: Wesley [name], male    DOB: 10-Aug-1941,  MRN: [id number] ? ?Chief Complaint  ?Patient presents with  ? Nail Problem  ?    np nail trim undergoing chemo/ req Dr Sherryle Lis  ? ? ?81 y.o. male presents with the above complaint. History confirmed with patient. He presents today for painful thickened elongated toenails that he cannot cut.  He also has multiple calluses that are causing pressure and pain.  His wife is a patient of mine and referred him to me.  He is currently undergoing chemotherapy and has noticed burning tingling pain as well as numbness in the feet. ? ?Objective:  ?Physical Exam: ?warm, good capillary refill, no trophic changes or ulcerative lesions, normal DP and PT pulses, normal sensory exam, and diffuse peripheral neuropathy with loss of protective sensation to [employer] monofilament. ?Left Foot: dystrophic yellowed discolored nail plates with subungual debris and hyperkeratotic lesion distal tip of third toe ?Right Foot: dystrophic yellowed discolored nail plates with subungual debris and hyperkeratotic lesion distal tip of great toe ? ?Assessment:  ? ?1. Chemotherapy-induced peripheral neuropathy ([place])   ?2. Pain due to onychomycosis of toenails of both feet   ?3. Callus of foot   ? ? ? ?Plan:  ?Patient was evaluated and treated and all questions answered. ? ?Discussed the etiology and treatment options for the condition in detail with the patient. Educated patient on the topical and oral treatment options for mycotic nails. Recommended debridement of the nails today. Sharp and mechanical debridement performed of all painful and mycotic nails today. Nails debrided in length and thickness using a nail nipper to level of comfort. Discussed treatment options including appropriate shoe gear. Follow up as needed for painful nails. ? ? ?All symptomatic hyperkeratoses were safely debrided with a sterile #15 blade to patient's level of comfort without incident. We  discussed preventative and palliative care of these lesions including supportive and accommodative shoegear, padding, prefabricated and custom molded accommodative orthoses, use of a pumice stone and lotions/creams daily. ? ? ? ?Return in about 3 months (around 01/18/2022) for at risk foot care.  ? ?

## 2021-10-21 ENCOUNTER — Inpatient Hospital Stay (HOSPITAL_BASED_OUTPATIENT_CLINIC_OR_DEPARTMENT_OTHER): Payer: Medicare Other | Admitting: Physician Assistant

## 2021-10-21 ENCOUNTER — Inpatient Hospital Stay: Payer: Medicare Other

## 2021-10-21 ENCOUNTER — Other Ambulatory Visit: Payer: Self-pay

## 2021-10-21 VITALS — BP 156/88 | HR 89 | Temp 97.5°F | Resp 18 | Ht 68.0 in | Wt 198.3 lb

## 2021-10-21 DIAGNOSIS — C9000 Multiple myeloma not having achieved remission: Secondary | ICD-10-CM

## 2021-10-21 DIAGNOSIS — Z5111 Encounter for antineoplastic chemotherapy: Secondary | ICD-10-CM | POA: Diagnosis not present

## 2021-10-21 DIAGNOSIS — D539 Nutritional anemia, unspecified: Secondary | ICD-10-CM

## 2021-10-21 DIAGNOSIS — Z79899 Other long term (current) drug therapy: Secondary | ICD-10-CM | POA: Diagnosis not present

## 2021-10-21 DIAGNOSIS — D333 Benign neoplasm of cranial nerves: Secondary | ICD-10-CM | POA: Diagnosis not present

## 2021-10-21 LAB — CBC WITH DIFFERENTIAL (CANCER CENTER ONLY)
Abs Immature Granulocytes: 0.01 10*3/uL (ref 0.00–0.07)
Basophils Absolute: 0 10*3/uL (ref 0.0–0.1)
Basophils Relative: 1 %
Eosinophils Absolute: 0.6 10*3/uL — ABNORMAL HIGH (ref 0.0–0.5)
Eosinophils Relative: 16 %
HCT: 35.6 % — ABNORMAL LOW (ref 39.0–52.0)
Hemoglobin: 12.1 g/dL — ABNORMAL LOW (ref 13.0–17.0)
Immature Granulocytes: 0 %
Lymphocytes Relative: 11 %
Lymphs Abs: 0.5 10*3/uL — ABNORMAL LOW (ref 0.7–4.0)
MCH: 34.3 pg — ABNORMAL HIGH (ref 26.0–34.0)
MCHC: 34 g/dL (ref 30.0–36.0)
MCV: 100.8 fL — ABNORMAL HIGH (ref 80.0–100.0)
Monocytes Absolute: 0.5 10*3/uL (ref 0.1–1.0)
Monocytes Relative: 13 %
Neutro Abs: 2.5 10*3/uL (ref 1.7–7.7)
Neutrophils Relative %: 59 %
Platelet Count: 172 10*3/uL (ref 150–400)
RBC: 3.53 MIL/uL — ABNORMAL LOW (ref 4.22–5.81)
RDW: 13.5 % (ref 11.5–15.5)
WBC Count: 4.1 10*3/uL (ref 4.0–10.5)
nRBC: 0 % (ref 0.0–0.2)

## 2021-10-21 LAB — CMP (CANCER CENTER ONLY)
ALT: 28 U/L (ref 0–44)
AST: 18 U/L (ref 15–41)
Albumin: 3.5 g/dL (ref 3.5–5.0)
Alkaline Phosphatase: 108 U/L (ref 38–126)
Anion gap: 5 (ref 5–15)
BUN: 22 mg/dL (ref 8–23)
CO2: 22 mmol/L (ref 22–32)
Calcium: 8.4 mg/dL — ABNORMAL LOW (ref 8.9–10.3)
Chloride: 105 mmol/L (ref 98–111)
Creatinine: 0.88 mg/dL (ref 0.61–1.24)
GFR, Estimated: >60 mL/min (ref >60)
Glucose, Bld: 114 mg/dL — ABNORMAL HIGH (ref 70–99)
Potassium: 4.6 mmol/L (ref 3.5–5.1)
Sodium: 132 mmol/L — ABNORMAL LOW (ref 135–145)
Total Bilirubin: 0.4 mg/dL (ref 0.3–1.2)
Total Protein: 7 g/dL (ref 6.5–8.1)

## 2021-10-21 LAB — LACTATE DEHYDROGENASE: LDH: 119 U/L (ref 98–192)

## 2021-10-21 MED ORDER — ACETAMINOPHEN 325 MG PO TABS
650.0000 mg | ORAL_TABLET | Freq: Once | ORAL | Status: AC
  Administered 2021-10-21: 650 mg via ORAL
  Filled 2021-10-21: qty 2

## 2021-10-21 MED ORDER — DIPHENHYDRAMINE HCL 25 MG PO CAPS
50.0000 mg | ORAL_CAPSULE | Freq: Once | ORAL | Status: AC
  Administered 2021-10-21: 50 mg via ORAL
  Filled 2021-10-21: qty 2

## 2021-10-21 MED ORDER — DARATUMUMAB-HYALURONIDASE-FIHJ 1800-30000 MG-UT/15ML ~~LOC~~ SOLN
1800.0000 mg | Freq: Once | SUBCUTANEOUS | Status: AC
  Administered 2021-10-21: 1800 mg via SUBCUTANEOUS
  Filled 2021-10-21: qty 15

## 2021-10-21 NOTE — Progress Notes (Signed)
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 Further workup showed SPEP M-Spike 4.2 g/dL with IgG-Kappa as monoclonal protein, IgG 5908 mg/dL 09/08/2021: Initial evaluation at [facility]. bone marrow biopsy performed, showed 60% abnormal plasma cells in 60% cellular marrow. 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  09/26/2021: transfer care to Dr. Lorenso Courier at [facility].  09/30/2021: Cycle 1 Day 1 of Dara/Dex 10/06/2021: Cycle 1 Day 8 (added revlimid)  Interval History:  Wesley [name] 81 y.o. male with medical history significant for IgG kappa multiple myeloma who presents for a follow up visit. The patient's last visit was on 10/06/2021 and in the interim since the last visit he continued treatment with Dara/Revlimid/dexamethasone.  On exam today Mr. Reither reports continues to tolerate his treatment without any significant limitations. He reports mild improvement of his energy levels. He has a good appetite with stable weight. He denies any nausea, vomiting or abdominal pain. His bowel habits are unchanged without any diarrhea or constipation. He denies easy bruising or signs of active bleeding. He reports having significant bilateral leg cramps that last most of the night. Otherwise he has no edema or neuropathy. He denies fevers, chills, night sweats, shortness of breath, chest pain or cough. He has no other complaints. Full 10 point ROS  is listed below.  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)    CAD (coronary artery disease)    Cholelithiasis    Chronic allergic rhinitis    Coronary atherosclerosis of native coronary artery    Fatty liver    GERD (gastroesophageal reflux disease)    HX 70yr ago, no longer a problem, no meds   High blood pressure    Hypercholesterolemia    Macrocytosis without anemia    Multiple myeloma not having achieved remission ([place] 09/26/2021   OSA on CPAP    Paraesophageal hernia    Partial bowel obstruction (HCC)    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 PT denies significant findings   CARDIAC CATHETERIZATION  2008   COLOSTOMY N/A 10/31/2019   Procedure: COLOSTOMY;  Surgeon: CErroll Luna MD;  Location: [facility];  Service: General;  Laterality: N/A;   CYSTOSCOPY W/ URETERAL STENT PLACEMENT Bilateral 10/31/2019   Procedure: CYSTOSCOPY URETERAL STENT PLACEMENT BILATERAL;  Surgeon: DFranchot Gallo MD;  Location: [facility];  Service: Urology;  Laterality: Bilateral;   CYSTOSCOPY WITH STENT PLACEMENT Bilateral 03/11/2020   Procedure: CYSTOSCOPY WITH BILATERAL FIREFLY INJECTION;  Surgeon: WIrine Seal MD;  Location[facility];  Service: Urology;  Laterality: Bilateral;   EYE SURGERY     BILATERAL CATARACT  SURGERY WITH LENS IMPLANTS   FLEXIBLE SIGMOIDOSCOPY N/A 10/29/2019   Procedure: FLEXIBLE SIGMOIDOSCOPY;  Surgeon: Jerene Bears, MD;  [facility];  Service: Gastroenterology;  Laterality: N/A;   INCISIONAL HERNIA REPAIR N/A 10/13/2020   Procedure: OPEN INCISIONAL  HERNIA REPAIR WITH MESH;  Surgeon: [name], MD;  Location: [place];  Service: General;  Laterality: N/A;   INSERTION OF MESH N/A 09/08/2016   Procedure: INSERTION OF MESH;  Surgeon: Jackolyn Confer, MD;  [facility]: [facility];  Service: General;  Laterality: N/A;   IR RADIOLOGIST EVAL & MGMT  11/10/2020   IR RADIOLOGIST EVAL & MGMT  11/25/2020   LAPAROSCOPIC SIGMOID COLECTOMY N/A 10/31/2019   Procedure: DIAGNOSTIC LAPAROSCOPY; EXPLORATORY LAPAROTOMY; SIGMOID COLECTOMY;  Surgeon: Erroll Luna, MD;  [facility];  Service: General;  Laterality: N/A;   right rotator cuff     SUBMUCOSAL TATTOO INJECTION  10/29/2019   Procedure: SUBMUCOSAL TATTOO INJECTION;  Surgeon: Jerene Bears, MD;  [facility];  Service: Gastroenterology;;   TONSILLECTOMY     UMBILICAL HERNIA REPAIR N/A 09/08/2016   Procedure: OPEN UMBILICAL HERNIA REPAIR WITH MESH;  Surgeon: Jackolyn Confer, MD;  [facility];  Service: General;  Laterality: N/A;   VENTRAL HERNIA REPAIR N/A 02/03/2021   Procedure: LAPAROSCOPIC VENTRAL HERNIA REPAIR WITH MESH;  Surgeon: [name], MD;  Location: [place];  Service: General;  Laterality: N/A;   XI ROBOTIC ASSISTED COLOSTOMY TAKEDOWN N/A 03/11/2020   Procedure: ROBOTIC ASSISTED COLOSTOMY REVERSAL, RIGID PROCTOSCOPY;  Surgeon: Leighton Ruff, MD;  [facility];  Service: General;  Laterality: N/A;    SOCIAL HISTORY: Social History   Socioeconomic History   Marital status: Married    Spouse name: Zoe Nordin    Number of children: 2   Years of education: 12   Highest education level: Master's degree (e.g., MA, MS, MEng, MEd, MSW, MBA)  Occupational History   Occupation: Retired   Tobacco Use   Smoking status: Never   Smokeless tobacco: Never  Vaping Use   Vaping Use: Never used  Substance and Sexual Activity   Alcohol use: Yes    Alcohol/week: 17.0 standard drinks    Types: 3 Glasses of wine, 14 Standard drinks or equivalent per week   Drug use: No    Sexual activity: Not Currently  Other Topics Concern   Not on file  Social History Narrative   Married. 2 children 52 in 14 in 2022- both went to [employer]. 5 grandkids- all girls from oldest 79 looking premed [employer], 32 soph [employer], 17 71 year olds, 69 year olds.       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 [employer]: fishing, [employer] football and basketball, [place], [place] Strain: Low Risk    Difficulty of [place]: Not hard at [place]: No Food Insecurity   Worried About Charity fundraiser in [place]: Never true   Arboriculturist in the Last Year: Never true  Transportation Needs: No Transportation Needs   Lack of Transportation (Medical): No   Lack of Transportation (Non-Medical): No  Physical Activity: Inactive   Days of Exercise per Week: 0 days   Minutes of Exercise per Session: 0 min  Stress: No Stress Concern Present   Feeling  of Stress : Not at all  Social Connections: Socially Integrated   Frequency of Communication with Friends and Family: More than three times a week   Frequency of Social Gatherings with Friends and Family: Once a week   Attends Religious Services: More than 4 times per year   Active Member of [employer] or Organizations: Yes   Attends Music therapist: More than 4 times per year   Marital Status: Married  Human resources officer Violence: Not At Risk   Fear of Current or Ex-Partner: No   Emotionally Abused: No   Physically Abused: No   Sexually Abused: No    FAMILY HISTORY: Family History  Problem Relation Age of Onset   Diabetes Mother    Heart failure Mother        around 31   CAD Father        28   Melanoma Brother 70   Healthy Daughter    Healthy Son    Colon cancer Neg Hx    Esophageal cancer Neg Hx    Rectal cancer Neg Hx     Stomach cancer Neg Hx     ALLERGIES:  is allergic to demerol, promethazine hcl, and ativan [lorazepam].  MEDICATIONS:  Current Outpatient Medications  Medication Sig Dispense Refill   acetaminophen (TYLENOL) 500 MG tablet Take 500 mg by mouth every 6 (six) hours as needed.     acyclovir (ZOVIRAX) 400 MG tablet Take 400 mg by mouth in the morning and at bedtime.     allopurinol (ZYLOPRIM) 300 MG tablet Take 1 tablet (300 mg total) by mouth daily. 60 tablet 1   aspirin EC 81 MG tablet Take 81 mg by mouth daily. Swallow whole.     atorvastatin (LIPITOR) 80 MG tablet Take 1 tablet by mouth daily.     Cholecalciferol (VITAMIN D3) 25 MCG (1000 UT) CAPS Take 1,000 Units by mouth daily.     dexamethasone (DECADRON) 4 MG tablet Take 5 tablets (20 mg total) by mouth once a week. 20 tablet 3   fexofenadine (ALLEGRA) 180 MG tablet      finasteride (PROSCAR) 5 MG tablet Take 5 mg by mouth daily.     Ibuprofen 200 MG CAPS      lenalidomide (REVLIMID) 25 MG capsule Take 1 capsule (25 mg total) by mouth daily. Take for 21 days, then none for 7 days. Repeat every 28 days. [employer] Auth # [id number]; Date Obtained 09/26/21 21 capsule 0   lisinopril (ZESTRIL) 10 MG tablet Take 1 tablet (10 mg total) by mouth daily. 90 tablet 3   Magnesium Oxide 400 MG CAPS      melatonin 5 MG TABS      Omega-3 Fatty Acids (FISH OIL) 1200 MG CAPS Take 1,200 mg by mouth daily.     ondansetron (ZOFRAN) 8 MG tablet Take 1 tablet (8 mg total) by mouth every 8 (eight) hours as needed. 30 tablet 0   pantoprazole (PROTONIX) 40 MG tablet Take by mouth.     prochlorperazine (COMPAZINE) 10 MG tablet Take 1 tablet (10 mg total) by mouth every 6 (six) hours as needed for nausea or vomiting. 30 tablet 0   vitamin B-12 (CYANOCOBALAMIN) 1000 MCG tablet Take 1,000 mcg by mouth daily.     No current facility-administered medications for this visit.    REVIEW OF SYSTEMS:   Constitutional: ( - ) fevers, ( - )  chills , ( - ) night  sweats Eyes: ( - ) blurriness of vision, ( - )  double vision, ( - ) watery eyes Ears, nose, mouth, throat, and face: ( - ) mucositis, ( - ) sore throat Respiratory: ( - ) cough, ( - ) dyspnea, ( - ) wheezes Cardiovascular: ( - ) palpitation, ( - ) chest discomfort, ( - ) lower extremity swelling Gastrointestinal:  ( - ) nausea, ( - ) heartburn, ( - ) change in bowel habits Skin: ( - ) abnormal skin rashes Lymphatics: ( - ) new lymphadenopathy, ( - ) easy bruising Neurological: ( - ) numbness, ( - ) tingling, ( - ) new weaknesses Behavioral/Psych: ( - ) mood change, ( - ) new changes  All other systems were reviewed with the patient and are negative.  PHYSICAL EXAMINATION: ECOG PERFORMANCE STATUS: 1 - Symptomatic but completely ambulatory  Vitals:   10/21/21 1337  BP: (!) 156/88  Pulse: 89  Resp: 18  Temp: (!) 97.5 F (36.4 C)  SpO2: 97%   Filed Weights   10/21/21 1337  Weight: 198 lb 4.8 oz (89.9 kg)    GENERAL: Well-appearing elderly Caucasian male, alert, no distress and comfortable SKIN: skin color, texture, turgor are normal, no rashes or significant lesions EYES: conjunctiva are pink and non-injected, sclera clear LUNGS: clear to auscultation and percussion with normal breathing effort HEART: regular rate & rhythm and no murmurs and no lower extremity edema Musculoskeletal: no cyanosis of digits and no clubbing  PSYCH: alert & oriented x 3, fluent speech NEURO: no focal motor/sensory deficits  LABORATORY DATA:  I have reviewed the data as listed CBC Latest Ref Rng & Units 10/21/2021 10/14/2021 10/06/2021  WBC 4.0 - 10.5 K/uL 4.1 6.5 3.5(L)  Hemoglobin 13.0 - 17.0 g/dL 12.1(L) 12.3(L) 12.1(L)  Hematocrit 39.0 - 52.0 % 35.6(L) 36.4(L) 36.1(L)  Platelets 150 - 400 K/uL 172 169 140(L)    CMP Latest Ref Rng & Units 10/14/2021 10/06/2021 09/26/2021  Glucose 70 - 99 mg/dL 111(H) 142(H) 92  BUN 8 - 23 mg/dL 26(H) 20 19  Creatinine 0.61 - 1.24 mg/dL 1.05 0.94 0.82  Sodium 135 -  145 mmol/L 132(L) 132(L) 131(L)  Potassium 3.5 - 5.1 mmol/L 4.5 4.2 4.1  Chloride 98 - 111 mmol/L 103 102 103  CO2 22 - 32 mmol/L '25 26 25  ' Calcium 8.9 - 10.3 mg/dL 8.8(L) 8.9 8.7(L)  Total Protein 6.5 - 8.1 g/dL 8.2(H) 9.5(H) 11.2(H)  Total Bilirubin 0.3 - 1.2 mg/dL 0.6 0.4 0.6  Alkaline Phos 38 - 126 U/L 103 80 83  AST 15 - 41 U/L '21 18 26  ' ALT 0 - 44 U/L '27 24 27    ' Lab Results  Component Value Date   MPROTEIN 3.5 (H) 10/06/2021   MPROTEIN 5.0 (H) 09/26/2021   MPROTEIN 4.2 (H) 08/01/2021   Lab Results  Component Value Date   KPAFRELGTCHN 61.7 (H) 10/06/2021   KPAFRELGTCHN 449.4 (H) 09/26/2021   LAMBDASER 3.8 (L) 10/06/2021   LAMBDASER 6.4 09/26/2021   KAPLAMBRATIO 16.24 (H) 10/06/2021   KAPLAMBRATIO 70.22 (H) 09/26/2021    RADIOGRAPHIC STUDIES: MR BRAIN W WO CONTRAST  Result Date: 10/19/2021 CLINICAL DATA:  Vestibular schwannoma, assess treatment response EXAM: MRI HEAD WITHOUT AND WITH CONTRAST TECHNIQUE: Multiplanar, multiecho pulse sequences of the brain and surrounding structures were obtained without and with intravenous contrast. CONTRAST:  42m GADAVIST GADOBUTROL 1 MMOL/ML IV SOLN COMPARISON:  04/08/2021. FINDINGS: Brain: Redemonstrated enhancing lesion in the right IAC, along the course of the right seventh and eighth cranial nerves, measuring up to 6 x 9 x  5 mm (AP x TR x CC) (series 16, image 10 and series 15, image 8), previously 5 x 8 x 5 mm when remeasured similarly. The lesion does not extend into the cochlea. Otherwise normal appearance of the right inner ear. Normal appearance of the left IAC. No restricted diffusion to suggest acute or subacute infarct. No acute hemorrhage, mass, mass effect, or midline shift. No abnormal parenchymal or meningeal enhancement. No hydrocephalus or extra-axial collection. T2 hyperintense signal in the periventricular white matter, likely the sequela of mild chronic small vessel ischemic disease. Vascular: Normal flow voids. Skull and  upper cervical spine: Normal marrow signal. Sinuses/Orbits: Negative.  Status post bilateral lens replacements. Other: The mastoids are well aerated. 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 No other acute intracranial process. Electronically Signed   By: Merilyn Baba M.D.   On: 10/19/2021 14:45    ASSESSMENT & PLAN  Wesley [name] 81 y.o. male with medical history significant for IgG kappa multiple myeloma who presents for a follow up visit.   The patient meets diagnostic criteria by having greater than 60% plasma cells in his bone marrow.  At this time we are in agreement with [facility] in [place], Revlimid, and dexamethasone as the treatment of choice.   Previously we discussed the concept of comanaged care.  Comanaged care is when the patient has a local primary provider who administers local support and therapy while expert advice and treatment recommendations are rendered by a cancer specialist at a large academic center.  In this arrangement we provide local support, labs, treatment, and emergency visits, however the major decisions regarding the course of treatment are decided by a physician at an academic medical center.  The patient voiced his understanding of comanaged care and was agreeable to proceeding forward with this care model.   The regimen of choice for this patient will be Dara, Revlimid, and dexamethasone.  We will start with weekly treatments for the first 2 cycles.  After that for an additional 4 cycles he will be getting it every other week.  After cycle 6 the patient will then continue with monthly treatment.    # IgG Kappa Multiple Myeloma --Patient meets diagnostic criteria for multiple myeloma based on the presence of 60% plasma cells in the bone marrow --Pretreatment phenotype RBC as well as type and screen ordered  --Cycle 1 Day 1of Dara/Rev/Dex started on 09/30/2021. He started without revlimid which was  added on 10/06/2021 (Cycle 1 Day 8)  -- Labs to include weekly CBC, CMP, LDH.  Additionally monthly SPEP and serum free light chains. Plan:  --Today is Cycle 1 Day 22 of Dara/Rev/Dex --Labs today were reviewed and adequate for treatment.  -- Return to clinic in 2 weeks time with interval weekly treatments.  #Macrocytic anemia: --Hgb is 12.1, MCV 100.8 --Will check vitamin B12 and folate levels at next visit.    #Supportive Care -- chemotherapy education complete  -- port placement not required.  --ASA 81 mg p.o. daily for thromboprophylaxis on Revlimid. -- zofran 64m q8H PRN and compazine 190mPO q6H for nausea -- acyclovir 40041mO BID for VCZ prophylaxis -- allopurinol 300m74m daily for TLS prophylaxis -- zometa to start as soon as is feasible.  -- no pain medication required at this time.   No orders of the defined types were placed in this encounter.   All questions were answered. The patient knows to call the clinic with any  problems, questions or concerns.  I have spent a total of 30 minutes minutes of face-to-face and non-face-to-face time, preparing to see the patient, performing a medically appropriate examination, counseling and educating the patient, ordering medications/tests, documenting clinical information in the electronic health record,  and care coordination.   Dede Query PA-C Dept of Hematology and [place] at [facility] Phone: [phone]   10/21/2021 1:48 PM

## 2021-10-23 ENCOUNTER — Encounter: Payer: Self-pay | Admitting: Hematology and Oncology

## 2021-10-24 ENCOUNTER — Inpatient Hospital Stay: Payer: Medicare Other

## 2021-10-24 ENCOUNTER — Encounter: Payer: Self-pay | Admitting: Hematology and Oncology

## 2021-10-24 ENCOUNTER — Inpatient Hospital Stay (HOSPITAL_BASED_OUTPATIENT_CLINIC_OR_DEPARTMENT_OTHER): Payer: Medicare Other | Admitting: Internal Medicine

## 2021-10-24 ENCOUNTER — Other Ambulatory Visit: Payer: Self-pay

## 2021-10-24 VITALS — BP 150/81 | HR 97 | Temp 97.0°F | Resp 16 | Ht 68.0 in | Wt 196.3 lb

## 2021-10-24 DIAGNOSIS — C9000 Multiple myeloma not having achieved remission: Secondary | ICD-10-CM | POA: Diagnosis not present

## 2021-10-24 DIAGNOSIS — Z79899 Other long term (current) drug therapy: Secondary | ICD-10-CM | POA: Diagnosis not present

## 2021-10-24 DIAGNOSIS — D333 Benign neoplasm of cranial nerves: Secondary | ICD-10-CM

## 2021-10-24 DIAGNOSIS — Z5111 Encounter for antineoplastic chemotherapy: Secondary | ICD-10-CM | POA: Diagnosis not present

## 2021-10-24 NOTE — Progress Notes (Signed)
[facility] [address] [place], [place] [address] [phone]   Interval Evaluation  Date of Service: 10/24/21 Patient Name: Wesley [name] Patient MRN: [id number] Patient DOB: March 10, 1941 Provider: Ventura Sellers, MD  Identifying Statement:  Wesley [name] is a 81 y.o. male with  right   vestibular schwannoma    CNS Oncologic History 04/04/21: MRI demonstrates small R vestibular schwannoma after provoked fall  Interval History: Wesley [name] presents today for follow up after recent MRI brain.  He describes no new or progressive deficits.  No changes in his hearing or recurrence of vertigo.  He is currently under treatment for multiple myeloma with Dr. Lorenso Courier, doing well with that aside from mild neuropathy.    H+P (04/19/21) Patient presented to medical attention on 8/22 after fall and head trauma, provoked by tripping over his dogs.  He hit the side of his head on some furniture; next day developed vertigo symptoms, CNS imaging subsequently demonstrated a right sided vestibular schwannoma.  Since then, headaches have resolved, but he still experiences "sometimes intense" vertigo upon sitting up or laying down, duration is "seconds to a minute" and it does not persist between episodes.  Otherwise denies double vision, facial weakness or numbness.  Functionally intact and independent at home.  Medications: Current Outpatient Medications on File Prior to Visit  Medication Sig Dispense Refill   acetaminophen (TYLENOL) 500 MG tablet Take 500 mg by mouth every 6 (six) hours as needed.     acyclovir (ZOVIRAX) 400 MG tablet Take 400 mg by mouth in the morning and at bedtime.     allopurinol (ZYLOPRIM) 300 MG tablet Take 1 tablet (300 mg total) by mouth daily. 60 tablet 1   aspirin EC 81 MG tablet Take 81 mg by mouth daily. Swallow whole.     atorvastatin (LIPITOR) 80 MG tablet Take 1 tablet by mouth daily.     Cholecalciferol (VITAMIN D3)  25 MCG (1000 UT) CAPS Take 1,000 Units by mouth daily.     dexamethasone (DECADRON) 4 MG tablet Take 5 tablets (20 mg total) by mouth once a week. 20 tablet 3   fexofenadine (ALLEGRA) 180 MG tablet      finasteride (PROSCAR) 5 MG tablet Take 5 mg by mouth daily.     Ibuprofen 200 MG CAPS      lenalidomide (REVLIMID) 25 MG capsule Take 1 capsule (25 mg total) by mouth daily. Take for 21 days, then none for 7 days. Repeat every 28 days. [employer] Auth # [id number]; Date Obtained 09/26/21 21 capsule 0   lisinopril (ZESTRIL) 10 MG tablet Take 1 tablet (10 mg total) by mouth daily. 90 tablet 3   Magnesium Oxide 400 MG CAPS      melatonin 5 MG TABS      Omega-3 Fatty Acids (FISH OIL) 1200 MG CAPS Take 1,200 mg by mouth daily.     ondansetron (ZOFRAN) 8 MG tablet Take 1 tablet (8 mg total) by mouth every 8 (eight) hours as needed. 30 tablet 0   pantoprazole (PROTONIX) 40 MG tablet Take by mouth.     prochlorperazine (COMPAZINE) 10 MG tablet Take 1 tablet (10 mg total) by mouth every 6 (six) hours as needed for nausea or vomiting. 30 tablet 0   vitamin B-12 (CYANOCOBALAMIN) 1000 MCG tablet Take 1,000 mcg by mouth daily.     No current facility-administered medications on file prior to visit.    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)    CAD (coronary artery disease)    Cholelithiasis    Chronic allergic rhinitis    Coronary atherosclerosis of native coronary artery    Fatty liver    GERD (gastroesophageal reflux disease)    HX 68yr ago, no longer a problem, no meds   High blood pressure    Hypercholesterolemia    Macrocytosis without anemia    Multiple [place] not having achieved remission ([facility]  09/26/2021   OSA on CPAP    Paraesophageal hernia    Partial bowel obstruction ([facility]    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 PT denies significant findings   CARDIAC CATHETERIZATION  2008   COLOSTOMY N/A 10/31/2019   Procedure: COLOSTOMY;  Surgeon: CErroll Luna MD;  [facility];  Service: General;  Laterality: N/A;   CYSTOSCOPY W/ URETERAL STENT PLACEMENT Bilateral 10/31/2019   Procedure: CYSTOSCOPY URETERAL STENT PLACEMENT BILATERAL;  Surgeon: DFranchot Gallo MD;  [facility];  Service: Urology;  Laterality: Bilateral;   CYSTOSCOPY WITH STENT PLACEMENT Bilateral 03/11/2020   Procedure: CYSTOSCOPY WITH BILATERAL FIREFLY INJECTION;  Surgeon: WIrine Seal MD;  [facility];  Service: Urology;  Laterality: Bilateral;   EYE SURGERY     BILATERAL CATARACT SURGERY WITH LENS IMPLANTS   FLEXIBLE SIGMOIDOSCOPY N/A 10/29/2019   Procedure: FLEXIBLE SIGMOIDOSCOPY;  Surgeon: PJerene Bears MD;  [facility];  Service: Gastroenterology;  Laterality: N/A;   INCISIONAL HERNIA REPAIR N/A 10/13/2020   Procedure: OPEN INCISIONAL HERNIA REPAIR WITH MESH;  Surgeon: RRalene Ok MD;  [facility]  Service: General;  Laterality: N/A;   INSERTION OF MESH N/A 09/08/2016   Procedure: INSERTION OF MESH;  Surgeon: TJackolyn Confer MD;  [facility];  Service: General;  Laterality: N/A;   IR RADIOLOGIST EVAL & MGMT  11/10/2020   IR RADIOLOGIST EVAL & MGMT  11/25/2020   LAPAROSCOPIC SIGMOID COLECTOMY N/A 10/31/2019   Procedure: DIAGNOSTIC LAPAROSCOPY; EXPLORATORY LAPAROTOMY; SIGMOID COLECTOMY;  Surgeon: CErroll Luna MD;  [facility];  Service:  General;  Laterality: N/A;   right rotator cuff     SUBMUCOSAL TATTOO INJECTION  10/29/2019   Procedure: SUBMUCOSAL TATTOO INJECTION;  Surgeon: PJerene Bears MD;  [facility];  Service: Gastroenterology;;   TONSILLECTOMY     UMBILICAL HERNIA REPAIR N/A 09/08/2016   Procedure: OPEN UMBILICAL HERNIA REPAIR WITH MESH;  Surgeon: TJackolyn Confer MD;  [facility];  Service: General;  Laterality: N/A;   VENTRAL HERNIA REPAIR N/A 02/03/2021   Procedure: LAPAROSCOPIC VENTRAL HERNIA REPAIR WITH MESH;  Surgeon: RRalene Ok MD;  [facility]  Service: General;  Laterality: N/A;   XI ROBOTIC ASSISTED COLOSTOMY TAKEDOWN N/A 03/11/2020   Procedure: ROBOTIC ASSISTED COLOSTOMY REVERSAL, RIGID PROCTOSCOPY;  Surgeon: TLeighton Ruff MD;  [facility];  Service: General;  Laterality: N/A;   Social History:  Social History   Socioeconomic History   Marital status: Married    Spouse name: Izeah Vossler    Number of children: 2   Years of education: 12   Highest education level: Master's degree (e.g., MA, MS, MEng, MEd, MSW, MBA)  Occupational History   Occupation: Retired   Tobacco Use   Smoking status: Never   Smokeless tobacco: Never  Vaping Use   Vaping Use: Never used  Substance and Sexual Activity   Alcohol use: Yes    Alcohol/week: 17.0 standard drinks    Types: 3 Glasses of wine, 14 Standard drinks or equivalent per week   Drug use: No   Sexual activity: Not Currently  Other Topics Concern   Not on file  Social History Narrative   Married. 2 children 52 in 15 in 2022- both went to [employer]. 5 grandkids- all girls from oldest 40 looking premed [employer], 22 soph , 59 67 year olds, 47 year olds.       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 [employer]: fishing, [employer] football and basketball, [place], [place] Strain: Low Risk    Difficulty of [place]: Not hard at [place]: No Food Insecurity   Worried About Charity fundraiser in [place]: Never true   Arboriculturist in the Last Year: Never true  Transportation Needs: No Transportation Needs   Lack of Transportation (Medical): No   Lack of Transportation (Non-Medical): No  Physical Activity: Inactive   Days of Exercise per Week: 0 days   Minutes of Exercise per Session: 0 min  Stress: No Stress Concern Present   Feeling of Stress : Not at all  Social Connections: Socially Integrated   Frequency of Communication with Friends and Family: More than three times a week   Frequency of Social Gatherings with Friends and Family: Once a week   Attends Religious Services: More than 4 times per year   Active Member of [employer] or Organizations: Yes   Attends Music therapist: More than 4 times per year   Marital Status: Married  Human resources officer Violence: Not At Risk   Fear of Current or Ex-Partner: No   Emotionally Abused: No   Physically Abused: No   Sexually Abused: No   Family History:  Family History  Problem Relation Age of Onset   Diabetes Mother    Heart failure Mother        around 73   CAD Father        78   Melanoma Brother 54   Healthy Daughter    Healthy Son    Colon cancer Neg Hx    Esophageal cancer Neg Hx    Rectal cancer Neg Hx    Stomach cancer Neg Hx     Review of Systems: Constitutional: Doesn't report fevers, chills or abnormal weight loss Eyes: Doesn't report blurriness of vision Ears, nose, mouth, throat, and face: Doesn't report sore throat Respiratory: Doesn't report cough, dyspnea or wheezes Cardiovascular: Doesn't report palpitation, chest discomfort  Gastrointestinal:  Doesn't report nausea, constipation, diarrhea GU: Doesn't report incontinence Skin: Doesn't report skin rashes Neurological: Per HPI Musculoskeletal: Doesn't report joint  pain Behavioral/Psych: Doesn't report anxiety  Physical Exam: Vitals:   10/24/21 1016  BP: (!) 150/81  Pulse: 97  Resp: 16  Temp: (!) 97 F (36.1 C)  SpO2: 99%   KPS: 90. General: Alert, cooperative, pleasant, in no acute distress Head: Normal EENT: No conjunctival injection or scleral icterus.  Lungs: Resp effort normal Cardiac: Regular rate Abdomen: Non-distended abdomen Skin: No rashes cyanosis or petechiae. Extremities: No clubbing or edema  Neurologic Exam: Mental Status: Awake, alert, attentive to examiner. Oriented to self and environment. Language is fluent with intact comprehension.  Cranial Nerves: Visual acuity is grossly normal. Visual fields are full. Extra-ocular movements intact. No ptosis. Face is symmetric Motor: Tone and bulk are normal. Power is full in both arms and legs. Reflexes are symmetric, no pathologic reflexes present.  Sensory: Intact to light touch Gait: Normal.   Labs: I have reviewed the data as listed    Component Value Date/Time   NA 132 (L) 10/21/2021 1321   K 4.6 10/21/2021 1321   CL 105 10/21/2021 1321   CO2 22 10/21/2021 1321   GLUCOSE 114 (H) 10/21/2021 1321   BUN 22 10/21/2021 1321   CREATININE 0.88 10/21/2021 1321   CALCIUM 8.4 (L) 10/21/2021 1321   PROT 7.0 10/21/2021 1321   PROT 10.4 (HH) 08/01/2021 1117   ALBUMIN 3.5 10/21/2021 1321   AST 18 10/21/2021 1321   ALT 28 10/21/2021 1321   ALKPHOS 108 10/21/2021 1321   BILITOT 0.4 10/21/2021 1321   GFRNONAA >60 10/21/2021 1321   GFRAA >60 03/14/2020 0553   Lab Results  Component Value Date   WBC 4.1 10/21/2021   NEUTROABS 2.5 10/21/2021   HGB 12.1 (L) 10/21/2021   HCT 35.6 (L) 10/21/2021   MCV 100.8 (H) 10/21/2021   PLT 172 10/21/2021   Audiology: 06/27/21 The hearing evaluation revealed 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 In the right ear, mild loss sloped to severe loss with the exception of WNL hearing sensitivity for 1000 and 2000 Hz. Slight  mixed component in the low frequencies. In the left ear, thresholds were WNL from 250- 3000 Hz, then mild to moderate loss for 4000, 6000, and 8000 Hz. Results were obtained using headphones then inserts.  Imaging:  [place] Clinician Interpretation: I have personally reviewed the CNS images as listed.  My interpretation, in the context of the patient's clinical presentation, is progressive disease  MR BRAIN W WO CONTRAST  Result Date: 10/19/2021 CLINICAL DATA:  Vestibular schwannoma, assess treatment response EXAM: MRI HEAD WITHOUT AND WITH CONTRAST TECHNIQUE: Multiplanar, multiecho pulse sequences of the brain and surrounding structures were obtained without and with intravenous contrast. CONTRAST:  65m GADAVIST GADOBUTROL 1 MMOL/ML IV SOLN COMPARISON:  04/08/2021. FINDINGS: Brain: Redemonstrated enhancing lesion in the right IAC, along the course of the right seventh and eighth cranial nerves, measuring up to 6 x 9 x 5 mm (AP x TR x CC) (series 16, image 10 and series 15, image 8), previously 5 x 8 x 5 mm when remeasured similarly. The lesion does not extend into the cochlea. Otherwise normal appearance of the right inner ear. Normal appearance of the left IAC. No restricted diffusion to suggest acute or subacute infarct. No acute hemorrhage, mass, mass effect, or midline shift. No abnormal parenchymal or meningeal enhancement. No hydrocephalus or extra-axial collection. T2 hyperintense signal in the periventricular white matter, likely the sequela of mild chronic small vessel ischemic disease. Vascular: Normal flow voids. Skull and upper cervical spine: Normal marrow signal. Sinuses/Orbits: Negative.  Status post  bilateral lens replacements. Other: The mastoids are well aerated. 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 No other acute intracranial process. Electronically Signed   By: Merilyn Baba M.D.   On: 10/19/2021 14:45    Assessment/Plan Vestibular schwannoma  (HCC)  Wesley [name] is clinically stable with regards to hearing, vestibular function.  Audiology was reviewed from November.    MRI demonstrates very mild progression in enhancing volume of vestibular schwannoma, ~19m over 6 months.  We again reviewed options for intervention, including surgical resection, radiosurgery, serial imaging observation.  Because his tumor is small, asymptomatic, hearing likely normal, it would be reasonable to defer radiation until later date.  This is also an appealing option given concurrent chemotherapy dosing for the myeloma.  We would then re-assess with MRI brain and repeat audiology in 6 months.  He will con't to follow up with regular audiology, ENT.    All questions were answered. The patient knows to call the clinic with any problems, questions or concerns. No barriers to learning were detected.  The total time spent in the encounter was 40 minutes and more than 50% was on counseling and review of test results   ZVentura Sellers MD Medical Director of Neuro-Oncology [facility]at [facility]03/13/23 10:17 AM

## 2021-10-25 ENCOUNTER — Other Ambulatory Visit: Payer: Self-pay

## 2021-10-25 DIAGNOSIS — C9000 Multiple myeloma not having achieved remission: Secondary | ICD-10-CM

## 2021-10-25 MED ORDER — LENALIDOMIDE 25 MG PO CAPS: 25.0000 mg | ORAL_CAPSULE | Freq: Every day | ORAL | 0 refills | Status: DC

## 2021-10-27 ENCOUNTER — Other Ambulatory Visit: Payer: Self-pay | Admitting: *Deleted

## 2021-10-27 MED ORDER — LENALIDOMIDE 25 MG PO CAPS: 25.0000 mg | ORAL_CAPSULE | Freq: Every day | ORAL | 0 refills | Status: DC

## 2021-10-28 ENCOUNTER — Inpatient Hospital Stay: Payer: Medicare Other

## 2021-10-28 ENCOUNTER — Other Ambulatory Visit: Payer: Self-pay

## 2021-10-28 ENCOUNTER — Other Ambulatory Visit: Payer: Self-pay | Admitting: Hematology

## 2021-10-28 ENCOUNTER — Inpatient Hospital Stay (HOSPITAL_BASED_OUTPATIENT_CLINIC_OR_DEPARTMENT_OTHER): Payer: Medicare Other | Admitting: Physician Assistant

## 2021-10-28 VITALS — BP 146/70 | HR 95 | Temp 98.4°F | Resp 19 | Ht 68.0 in | Wt 197.6 lb

## 2021-10-28 DIAGNOSIS — Z5111 Encounter for antineoplastic chemotherapy: Secondary | ICD-10-CM | POA: Diagnosis not present

## 2021-10-28 DIAGNOSIS — D539 Nutritional anemia, unspecified: Secondary | ICD-10-CM

## 2021-10-28 DIAGNOSIS — C9000 Multiple myeloma not having achieved remission: Secondary | ICD-10-CM

## 2021-10-28 DIAGNOSIS — Z79899 Other long term (current) drug therapy: Secondary | ICD-10-CM | POA: Diagnosis not present

## 2021-10-28 DIAGNOSIS — D333 Benign neoplasm of cranial nerves: Secondary | ICD-10-CM | POA: Diagnosis not present

## 2021-10-28 LAB — CBC WITH DIFFERENTIAL (CANCER CENTER ONLY)
Abs Immature Granulocytes: 0.02 10*3/uL (ref 0.00–0.07)
Basophils Absolute: 0 10*3/uL (ref 0.0–0.1)
Basophils Relative: 1 %
Eosinophils Absolute: 0.5 10*3/uL (ref 0.0–0.5)
Eosinophils Relative: 13 %
HCT: 35.6 % — ABNORMAL LOW (ref 39.0–52.0)
Hemoglobin: 12.5 g/dL — ABNORMAL LOW (ref 13.0–17.0)
Immature Granulocytes: 1 %
Lymphocytes Relative: 15 %
Lymphs Abs: 0.6 10*3/uL — ABNORMAL LOW (ref 0.7–4.0)
MCH: 35.5 pg — ABNORMAL HIGH (ref 26.0–34.0)
MCHC: 35.1 g/dL (ref 30.0–36.0)
MCV: 101.1 fL — ABNORMAL HIGH (ref 80.0–100.0)
Monocytes Absolute: 0.7 10*3/uL (ref 0.1–1.0)
Monocytes Relative: 17 %
Neutro Abs: 2.1 10*3/uL (ref 1.7–7.7)
Neutrophils Relative %: 53 %
Platelet Count: 251 10*3/uL (ref 150–400)
RBC: 3.52 MIL/uL — ABNORMAL LOW (ref 4.22–5.81)
RDW: 14 % (ref 11.5–15.5)
WBC Count: 4 10*3/uL (ref 4.0–10.5)
nRBC: 0 % (ref 0.0–0.2)

## 2021-10-28 LAB — CMP (CANCER CENTER ONLY)
ALT: 31 U/L (ref 0–44)
AST: 18 U/L (ref 15–41)
Albumin: 3.6 g/dL (ref 3.5–5.0)
Alkaline Phosphatase: 117 U/L (ref 38–126)
Anion gap: 6 (ref 5–15)
BUN: 29 mg/dL — ABNORMAL HIGH (ref 8–23)
CO2: 23 mmol/L (ref 22–32)
Calcium: 8.7 mg/dL — ABNORMAL LOW (ref 8.9–10.3)
Chloride: 104 mmol/L (ref 98–111)
Creatinine: 1.27 mg/dL — ABNORMAL HIGH (ref 0.61–1.24)
GFR, Estimated: 57 mL/min — ABNORMAL LOW (ref >60)
Glucose, Bld: 138 mg/dL — ABNORMAL HIGH (ref 70–99)
Potassium: 4.3 mmol/L (ref 3.5–5.1)
Sodium: 133 mmol/L — ABNORMAL LOW (ref 135–145)
Total Bilirubin: 0.5 mg/dL (ref 0.3–1.2)
Total Protein: 6.9 g/dL (ref 6.5–8.1)

## 2021-10-28 LAB — LACTATE DEHYDROGENASE: LDH: 124 U/L (ref 98–192)

## 2021-10-28 LAB — VITAMIN B12: Vitamin B-12: 993 pg/mL — ABNORMAL HIGH (ref 180–914)

## 2021-10-28 MED ORDER — DARATUMUMAB-HYALURONIDASE-FIHJ 1800-30000 MG-UT/15ML ~~LOC~~ SOLN
1800.0000 mg | Freq: Once | SUBCUTANEOUS | Status: AC
  Administered 2021-10-28: 1800 mg via SUBCUTANEOUS
  Filled 2021-10-28: qty 15

## 2021-10-28 MED ORDER — ACETAMINOPHEN 325 MG PO TABS
650.0000 mg | ORAL_TABLET | Freq: Once | ORAL | Status: AC
  Administered 2021-10-28: 650 mg via ORAL
  Filled 2021-10-28: qty 2

## 2021-10-28 MED ORDER — DIPHENHYDRAMINE HCL 25 MG PO CAPS
50.0000 mg | ORAL_CAPSULE | Freq: Once | ORAL | Status: AC
  Administered 2021-10-28: 50 mg via ORAL
  Filled 2021-10-28: qty 2

## 2021-10-28 MED ORDER — DEXAMETHASONE 4 MG PO TABS
20.0000 mg | ORAL_TABLET | Freq: Once | ORAL | Status: AC
  Administered 2021-10-28: 20 mg via ORAL
  Filled 2021-10-28: qty 5

## 2021-10-28 NOTE — Progress Notes (Signed)
?Wesley [name] ?[phone]   [phone] ? ?PROGRESS NOTE ? ?Patient Care Team: ?Wesley Olp, MD as PCP - General (Family Medicine) ?Wesley Pain, MD as PCP - Cardiology (Cardiology) ?Wesley Ok, MD as Consulting Physician (General Surgery) ? ?Hematological/Oncological History ?# IgG Kappa Multiple Myeloma ?07/22/2021: noted to have elevated serum protein and Hgb 12.3 (mild anemia). Further workup showed SPEP M-Spike 4.2 g/dL with IgG-Kappa as monoclonal protein, IgG 5908 mg/dL ?09/08/2021: Initial evaluation at [facility]. bone marrow biopsy performed, showed 60% abnormal plasma cells in 60% cellular marrow. Labs showed kappa 35.28 mg/dL, lambda 0.77 mg/dL, K/L ratio 45.82 and M-Spike 4.48 g/dL, IgG kappa monoclonal gammopathy ?09/15/2021: CT skeletal survey showed no CT evidence of aggressive osseous lesions.  ?09/26/2021: transfer care to Dr. Lorenso [name] at [facility].  ?09/30/2021: Cycle 1 Day 1 of Dara/Dex ?10/06/2021: Cycle 1 Day 8 (added revlimid) ? ?Interval History:  ?Wesley [name] 81 y.o. male with medical history significant for IgG kappa multiple myeloma who presents for a follow up visit. The patient's last visit was on 10/21/2021 and in the interim since the last visit he continued treatment with Dara/Revlimid/dexamethasone. ? ?On exam today Wesley [name] reports continues to tolerate his treatment without any significant limitations. He reports his energy levels are fairly stable.  He does have fatigue but tries to stay active and do yard work.  His appetite and weight are stable.  He denies any nausea, vomiting or abdominal [name]. His bowel habits are unchanged. He denies easy bruising or signs of active bleeding. He reports mild numbness in the fingers and toes without any interference to grip or balance. He denies fevers, chills, night sweats, shortness of breath, chest [name] or cough. He has no other complaints. Full 10 point ROS is  listed below. ? ?MEDICAL HISTORY:  ?Past Medical History:  ?Diagnosis Date  ? 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 CAD (coronary artery disease)   ? Cholelithiasis   ? Chronic allergic rhinitis   ? Coronary atherosclerosis of native coronary artery   ? Fatty liver   ? GERD (gastroesophageal reflux disease)   ? HX 38yr ago, no longer a problem, no meds  ? High blood pressure   ? Hypercholesterolemia   ? 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 [place] achieved [place] 09/26/2021  ? OSA on CPAP   ? Paraesophageal hernia   ? Partial bowel obstruction ([facility]   ? 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 Umbilical hernia without obstruction or gangrene   ? ? ?SURGICAL HISTORY: ?Past Surgical History:  ?Procedure Laterality Date  ? BIOPSY  10/29/2019  ? Procedure: BIOPSY;  Surgeon: PJerene Bears MD;  [facility];  Service: Gastroenterology;;  ? CARDIAC CATHETERIZATION  2000  ? Cath to rule out cardiac problems RT HTN. PT denies significant findings  ? CARDIAC CATHETERIZATION  2008  ? COLOSTOMY N/A 10/31/2019  ? Procedure: COLOSTOMY;  Surgeon: CErroll Luna MD;  [facility];  Service: General;  Laterality: N/A;  ? CYSTOSCOPY W/ URETERAL STENT PLACEMENT Bilateral 10/31/2019  ? Procedure: CYSTOSCOPY URETERAL STENT PLACEMENT BILATERAL;  Surgeon: DFranchot Gallo MD;  [facility];  Service: Urology;  Laterality: Bilateral;  ? CYSTOSCOPY WITH STENT PLACEMENT Bilateral 03/11/2020  ? Procedure: CYSTOSCOPY WITH BILATERAL FIREFLY INJECTION;  Surgeon: WIrine Seal MD;  [facility];  Service: Urology;  Laterality: Bilateral;  ? EYE SURGERY    ?  BILATERAL CATARACT SURGERY WITH LENS IMPLANTS  ? FLEXIBLE SIGMOIDOSCOPY N/A 10/29/2019  ? Procedure: FLEXIBLE SIGMOIDOSCOPY;  Surgeon: Jerene Bears, MD;  [facility];  Service: Gastroenterology;  Laterality: N/A;  ? INCISIONAL HERNIA REPAIR N/A 10/13/2020  ? Procedure: OPEN INCISIONAL  HERNIA REPAIR WITH MESH;  Surgeon: Wesley Ok, MD;  Location: [place];  Service: General;  Laterality: N/A;  ? INSERTION OF MESH N/A 09/08/2016  ? Procedure: INSERTION OF MESH;  Surgeon: Jackolyn Confer, MD;  [facility];  Service: General;  Laterality: N/A;  ? IR RADIOLOGIST EVAL & MGMT  11/10/2020  ? IR RADIOLOGIST EVAL & MGMT  11/25/2020  ? LAPAROSCOPIC SIGMOID COLECTOMY N/A 10/31/2019  ? Procedure: DIAGNOSTIC LAPAROSCOPY; EXPLORATORY LAPAROTOMY; SIGMOID COLECTOMY;  Surgeon: Erroll Luna, MD;  [facility];  Service: General;  Laterality: N/A;  ? right rotator cuff    ? SUBMUCOSAL TATTOO INJECTION  10/29/2019  ? Procedure: SUBMUCOSAL TATTOO INJECTION;  Surgeon: Jerene Bears, MD;  [facility];  Service: Gastroenterology;;  ? TONSILLECTOMY    ? UMBILICAL HERNIA REPAIR N/A 09/08/2016  ? Procedure: OPEN UMBILICAL HERNIA REPAIR WITH MESH;  Surgeon: Jackolyn Confer, MD;  [facility];  Service: General;  Laterality: N/A;  ? VENTRAL HERNIA REPAIR N/A 02/03/2021  ? Procedure: LAPAROSCOPIC VENTRAL HERNIA REPAIR WITH MESH;  Surgeon: Wesley Ok, MD;  Location: [place];  Service: General;  Laterality: N/A;  ? XI ROBOTIC ASSISTED COLOSTOMY TAKEDOWN N/A 03/11/2020  ? Procedure: ROBOTIC ASSISTED COLOSTOMY REVERSAL, RIGID PROCTOSCOPY;  Surgeon: Leighton Ruff, MD;  [facility];  Service: General;  Laterality: N/A;  ? ? ?SOCIAL HISTORY: ?Social History  ? ?Socioeconomic History  ? Marital status: Married  ?  Spouse name: Wesley [name]   ? Number of children: 2  ? Years of education: 26  ? Highest education level: Master's degree (e.g., MA, MS, MEng, MEd, MSW, MBA)  ?Occupational History  ? Occupation: Retired   ?Tobacco Use  ? Smoking status: Never  ? Smokeless tobacco: Never  ?Vaping Use  ? Vaping Use: Never used  ?Substance and Sexual Activity  ? Alcohol use: Yes  ?  Alcohol/week: 17.0 standard drinks  ?  Types: 3 Glasses of wine, 14 Standard drinks or equivalent per week  ? Drug use: No  ?  Sexual activity: Not Currently  ?Other Topics Concern  ? Not on file  ?Social History Narrative  ? Married. 2 children 52 in 36 in 2022- both went to [employer]. 5 grandkids- all girls from oldest 11 looking premed [employer], 23 soph [place], 36 21 year olds, 51 year olds.   ?   ? Retired Automotive engineer- Estate manager/land agent  ? 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 Undergrad and grad school at [employer]  ?   ? Hobbies: fishing, [place] football and basketball, [place], yardwork  ? ?Social Determinants of Health  ? ?Financial Resource Strain: Low Risk   ? Difficulty of Paying Living Expenses: Not hard at all  ?Food Insecurity: No Food Insecurity  ? Worried About Charity fundraiser in the Last Year: Never true  ? Ran Out of Food in the Last Year: Never true  ?Transportation Needs: No Transportation Needs  ? Lack of Transportation (Medical): No  ? Lack of Transportation (Non-Medical): No  ?Physical Activity: Inactive  ? Days of Exercise per Week: 0 days  ? Minutes of Exercise per Session: 0 min  ?Stress: No Stress Concern Present  ?  Feeling of Stress : Not at all  ?Social Connections: Socially Integrated  ? Frequency of Communication with Friends and Family: More than three times a week  ? Frequency of Social Gatherings with Friends and Family: Once a week  ? Attends Religious Services: More than 4 times per year  ? Active Member of Clubs or Organizations: Yes  ? Attends Archivist Meetings: More than 4 times per year  ? Marital Status: Married  ?Intimate Partner Violence: Not At Risk  ? Fear of Current or Ex-Partner: No  ? Emotionally Abused: No  ? Physically Abused: No  ? Sexually Abused: No  ? ? ?FAMILY HISTORY: ?Family History  ?Problem Relation Age of Onset  ? Diabetes Mother   ? Heart failure Mother   ?     around 23  ? CAD Father   ?     82  ? Melanoma Brother 88  ? Healthy Daughter   ? Healthy Son   ? Colon cancer Neg Hx   ? Esophageal cancer Neg Hx   ? Rectal cancer Neg Hx    ? Stomach cancer Neg Hx   ? ? ?ALLERGIES:  is allergic to demerol, promethazine hcl, and ativan [lorazepam]. ? ?MEDICATIONS:  ?Current Outpatient Medications  ?Medication Sig Dispense Refill  ? acetaminophen (

## 2021-10-28 NOTE — Patient Instructions (Signed)
[facility]ICAL [facility]COL[facility]   Discharge Instructions: Thank you for choosing [facility] to provide your oncology and hematology care.   If you have a lab appointment with the Cancer Center, please go directly to the Cancer Center and check in at the registration area.   Wear comfortable clothing and clothing appropriate for easy access to any Portacath or PICC line.   We strive to give you quality time with your provider. You may need to reschedule your appointment if you arrive late (15 or more minutes).  Arriving late affects you and other patients whose appointments are after yours.  Also, if you miss three or more appointments without notifying the office, you may be dismissed from the clinic at the provider's discretion.      For prescription refill requests, have your pharmacy contact our office and allow 72 hours for refills to be completed.    Today you received the following chemotherapy and/or immunotherapy agents: daratumumab-hyaluronidase-fihj      To help prevent nausea and vomiting after your treatment, we encourage you to take your nausea medication as directed.  BELOW ARE SYMPTOMS THAT SHOULD BE REPORTED IMMEDIATELY: *FEVER GREATER THAN 100.4 F (38 C) OR HIGHER *CHILLS OR SWEATING *NAUSEA AND VOMITING THAT IS NOT CONTROLLED WITH YOUR NAUSEA MEDICATION *UNUSUAL SHORTNESS OF BREATH *UNUSUAL BRUISING OR BLEEDING *URINARY PROBLEMS (pain or burning when urinating, or frequent urination) *BOWEL PROBLEMS (unusual diarrhea, constipation, pain near the anus) TENDERNESS IN MOUTH AND THROAT WITH OR WITHOUT PRESENCE OF ULCERS (sore throat, sores in mouth, or a toothache) UNUSUAL RASH, SWELLING OR PAIN  UNUSUAL VAGINAL DISCHARGE OR ITCHING   Items with * indicate a potential emergency and should be followed up as soon as possible or go to the Emergency Department if any problems should occur.  Please show the CHEMOTHERAPY ALERT CARD or IMMUNOTHERAPY  ALERT CARD at check-in to the Emergency Department and triage nurse.  Should you have questions after your visit or need to cancel or reschedule your appointment, please contact [facility]  Dept: [phone]  and follow the prompts.  Office hours are 8:00 a.m. to 4:30 p.m. Monday - Friday. Please note that voicemails left after 4:00 p.m. may not be returned until the following business day.  We are closed weekends and major holidays. You have access to a nurse at all times for urgent questions. Please call the main number to the clinic Dept: [phone] and follow the prompts.   For any non-urgent questions, you may also contact your provider using MyChart. We now offer e-Visits for anyone 18 and older to request care online for non-urgent symptoms. For details visit [email or web address].   Also download the MyChart app! Go to the app store, search "MyChart", open the app, select [facility], and log in with your MyChart username and password.  Due to Covid, a mask is required upon entering the hospital/clinic. If you do not have a mask, one will be given to you upon arrival. For doctor visits, patients may have 1 support Tannar Broker aged 18 or older with them. For treatment visits, patients cannot have anyone with them due to current Covid guidelines and our immunocompromised population.  

## 2021-10-30 LAB — FOLATE RBC
Folate, Hemolysate: 347 ng/mL
Folate, RBC: 956 ng/mL (ref >498)
Hematocrit: 36.3 % — ABNORMAL LOW (ref 37.5–51.0)

## 2021-10-31 LAB — METHYLMALONIC ACID, SERUM: Methylmalonic Acid, Quantitative: 255 nmol/L (ref 0–378)

## 2021-11-02 ENCOUNTER — Ambulatory Visit: Payer: Medicare Other | Admitting: Physician Assistant

## 2021-11-04 ENCOUNTER — Inpatient Hospital Stay: Payer: Medicare Other

## 2021-11-04 ENCOUNTER — Other Ambulatory Visit: Payer: Self-pay | Admitting: Hematology

## 2021-11-04 ENCOUNTER — Other Ambulatory Visit: Payer: Self-pay

## 2021-11-04 VITALS — BP 130/77 | HR 80 | Temp 98.1°F | Resp 18 | Wt 196.2 lb

## 2021-11-04 DIAGNOSIS — C9000 Multiple myeloma not having achieved remission: Secondary | ICD-10-CM | POA: Diagnosis not present

## 2021-11-04 DIAGNOSIS — Z79899 Other long term (current) drug therapy: Secondary | ICD-10-CM | POA: Diagnosis not present

## 2021-11-04 DIAGNOSIS — Z5111 Encounter for antineoplastic chemotherapy: Secondary | ICD-10-CM | POA: Diagnosis not present

## 2021-11-04 DIAGNOSIS — D333 Benign neoplasm of cranial nerves: Secondary | ICD-10-CM | POA: Diagnosis not present

## 2021-11-04 LAB — CBC WITH DIFFERENTIAL (CANCER CENTER ONLY)
Abs Immature Granulocytes: 0.02 10*3/uL (ref 0.00–0.07)
Basophils Absolute: 0.2 10*3/uL — ABNORMAL HIGH (ref 0.0–0.1)
Basophils Relative: 3 %
Eosinophils Absolute: 0.3 10*3/uL (ref 0.0–0.5)
Eosinophils Relative: 6 %
HCT: 37.3 % — ABNORMAL LOW (ref 39.0–52.0)
Hemoglobin: 12.7 g/dL — ABNORMAL LOW (ref 13.0–17.0)
Immature Granulocytes: 0 %
Lymphocytes Relative: 12 %
Lymphs Abs: 0.7 10*3/uL (ref 0.7–4.0)
MCH: 34.9 pg — ABNORMAL HIGH (ref 26.0–34.0)
MCHC: 34 g/dL (ref 30.0–36.0)
MCV: 102.5 fL — ABNORMAL HIGH (ref 80.0–100.0)
Monocytes Absolute: 0.4 10*3/uL (ref 0.1–1.0)
Monocytes Relative: 7 %
Neutro Abs: 4 10*3/uL (ref 1.7–7.7)
Neutrophils Relative %: 72 %
Platelet Count: 312 10*3/uL (ref 150–400)
RBC: 3.64 MIL/uL — ABNORMAL LOW (ref 4.22–5.81)
RDW: 14.6 % (ref 11.5–15.5)
WBC Count: 5.6 10*3/uL (ref 4.0–10.5)
nRBC: 0 % (ref 0.0–0.2)

## 2021-11-04 LAB — CMP (CANCER CENTER ONLY)
ALT: 29 U/L (ref 0–44)
AST: 17 U/L (ref 15–41)
Albumin: 3.7 g/dL (ref 3.5–5.0)
Alkaline Phosphatase: 120 U/L (ref 38–126)
Anion gap: 4 — ABNORMAL LOW (ref 5–15)
BUN: 18 mg/dL (ref 8–23)
CO2: 27 mmol/L (ref 22–32)
Calcium: 8.9 mg/dL (ref 8.9–10.3)
Chloride: 103 mmol/L (ref 98–111)
Creatinine: 0.92 mg/dL (ref 0.61–1.24)
GFR, Estimated: >60 mL/min (ref >60)
Glucose, Bld: 134 mg/dL — ABNORMAL HIGH (ref 70–99)
Potassium: 4.4 mmol/L (ref 3.5–5.1)
Sodium: 134 mmol/L — ABNORMAL LOW (ref 135–145)
Total Bilirubin: 0.5 mg/dL (ref 0.3–1.2)
Total Protein: 6.7 g/dL (ref 6.5–8.1)

## 2021-11-04 LAB — LACTATE DEHYDROGENASE: LDH: 129 U/L (ref 98–192)

## 2021-11-04 MED ORDER — DARATUMUMAB-HYALURONIDASE-FIHJ 1800-30000 MG-UT/15ML ~~LOC~~ SOLN
1800.0000 mg | Freq: Once | SUBCUTANEOUS | Status: AC
  Administered 2021-11-04: 1800 mg via SUBCUTANEOUS
  Filled 2021-11-04: qty 15

## 2021-11-04 MED ORDER — ACETAMINOPHEN 325 MG PO TABS
650.0000 mg | ORAL_TABLET | Freq: Once | ORAL | Status: AC
  Administered 2021-11-04: 650 mg via ORAL
  Filled 2021-11-04: qty 2

## 2021-11-04 MED ORDER — DIPHENHYDRAMINE HCL 25 MG PO CAPS
50.0000 mg | ORAL_CAPSULE | Freq: Once | ORAL | Status: AC
  Administered 2021-11-04: 50 mg via ORAL
  Filled 2021-11-04: qty 2

## 2021-11-04 NOTE — Patient Instructions (Signed)
[facility]   Discharge Instructions: Thank you for choosing [facility] to provide your oncology and hematology care.   If you have a lab appointment with the Cancer Center, please go directly to the Cancer Center and check in at the registration area.   Wear comfortable clothing and clothing appropriate for easy access to any Portacath or PICC line.   We strive to give you quality time with your provider. You may need to reschedule your appointment if you arrive late (15 or more minutes).  Arriving late affects you and other patients whose appointments are after yours.  Also, if you miss three or more appointments without notifying the office, you may be dismissed from the clinic at the provider's discretion.      For prescription refill requests, have your pharmacy contact our office and allow 72 hours for refills to be completed.    Today you received the following chemotherapy and/or immunotherapy agents: daratumumab-hyaluronidase-fihj      To help prevent nausea and vomiting after your treatment, we encourage you to take your nausea medication as directed.  BELOW ARE SYMPTOMS THAT SHOULD BE REPORTED IMMEDIATELY: *FEVER GREATER THAN 100.4 F (38 C) OR HIGHER *CHILLS OR SWEATING *NAUSEA AND VOMITING THAT IS NOT CONTROLLED WITH YOUR NAUSEA MEDICATION *UNUSUAL SHORTNESS OF BREATH *UNUSUAL BRUISING OR BLEEDING *URINARY PROBLEMS (pain or burning when urinating, or frequent urination) *BOWEL PROBLEMS (unusual diarrhea, constipation, pain near the anus) TENDERNESS IN MOUTH AND THROAT WITH OR WITHOUT PRESENCE OF ULCERS (sore throat, sores in mouth, or a toothache) UNUSUAL RASH, SWELLING OR PAIN  UNUSUAL VAGINAL DISCHARGE OR ITCHING   Items with * indicate a potential emergency and should be followed up as soon as possible or go to the Emergency Department if any problems should occur.  Please show the CHEMOTHERAPY ALERT CARD or IMMUNOTHERAPY  ALERT CARD at check-in to the Emergency Department and triage nurse.  Should you have questions after your visit or need to cancel or reschedule your appointment, please contact [facility]  Dept: [phone]  and follow the prompts.  Office hours are 8:00 a.m. to 4:30 p.m. Monday - Friday. Please note that voicemails left after 4:00 p.m. may not be returned until the following business day.  We are closed weekends and major holidays. You have access to a nurse at all times for urgent questions. Please call the main number to the clinic Dept: [phone] and follow the prompts.   For any non-urgent questions, you may also contact your provider using MyChart. We now offer e-Visits for anyone 18 and older to request care online for non-urgent symptoms. For details visit [email or web address].   Also download the MyChart app! Go to the app store, search "MyChart", open the app, select [facility], and log in with your MyChart username and password.  Due to Covid, a mask is required upon entering the hospital/clinic. If you do not have a mask, one will be given to you upon arrival. For doctor visits, patients may have 1 support person aged 18 or older with them. For treatment visits, patients cannot have anyone with them due to current Covid guidelines and our immunocompromised population.  

## 2021-11-04 NOTE — Progress Notes (Signed)
Patient took his steroid at about 08:30 this morning. ?

## 2021-11-07 LAB — KAPPA/LAMBDA LIGHT CHAINS
Kappa free light chain: 9.2 mg/L (ref 3.3–19.4)
Kappa, lambda light chain ratio: 2.09 — ABNORMAL HIGH (ref 0.26–1.65)
Lambda free light chains: 4.4 mg/L — ABNORMAL LOW (ref 5.7–26.3)

## 2021-11-08 ENCOUNTER — Encounter: Payer: Self-pay | Admitting: Hematology and Oncology

## 2021-11-09 LAB — MULTIPLE MYELOMA PANEL, SERUM
Albumin SerPl Elph-Mcnc: 3.2 g/dL (ref 2.9–4.4)
Albumin/Glob SerPl: 1.1 (ref 0.7–1.7)
Alpha 1: 0.3 g/dL (ref 0.0–0.4)
Alpha2 Glob SerPl Elph-Mcnc: 0.8 g/dL (ref 0.4–1.0)
B-Globulin SerPl Elph-Mcnc: 0.9 g/dL (ref 0.7–1.3)
Gamma Glob SerPl Elph-Mcnc: 1.1 g/dL (ref 0.4–1.8)
Globulin, Total: 3 g/dL (ref 2.2–3.9)
IgA: 51 mg/dL — ABNORMAL LOW (ref 61–437)
IgG (Immunoglobin G), Serum: 1196 mg/dL (ref 603–1613)
IgM (Immunoglobulin M), Srm: 30 mg/dL (ref 15–143)
M Protein SerPl Elph-Mcnc: 0.9 g/dL — ABNORMAL HIGH
Total Protein ELP: 6.2 g/dL (ref 6.0–8.5)

## 2021-11-11 ENCOUNTER — Inpatient Hospital Stay: Payer: Medicare Other

## 2021-11-11 ENCOUNTER — Inpatient Hospital Stay (HOSPITAL_BASED_OUTPATIENT_CLINIC_OR_DEPARTMENT_OTHER): Payer: Medicare Other | Admitting: Hematology and Oncology

## 2021-11-11 ENCOUNTER — Other Ambulatory Visit: Payer: Self-pay

## 2021-11-11 VITALS — Resp 16

## 2021-11-11 VITALS — BP 128/62 | HR 82 | Temp 98.2°F | Wt 197.6 lb

## 2021-11-11 DIAGNOSIS — C9000 Multiple myeloma not having achieved remission: Secondary | ICD-10-CM | POA: Diagnosis not present

## 2021-11-11 DIAGNOSIS — Z5111 Encounter for antineoplastic chemotherapy: Secondary | ICD-10-CM | POA: Diagnosis not present

## 2021-11-11 DIAGNOSIS — D333 Benign neoplasm of cranial nerves: Secondary | ICD-10-CM | POA: Diagnosis not present

## 2021-11-11 DIAGNOSIS — Z79899 Other long term (current) drug therapy: Secondary | ICD-10-CM | POA: Diagnosis not present

## 2021-11-11 LAB — CBC WITH DIFFERENTIAL (CANCER CENTER ONLY)
Abs Immature Granulocytes: 0.02 10*3/uL (ref 0.00–0.07)
Basophils Absolute: 0.1 10*3/uL (ref 0.0–0.1)
Basophils Relative: 2 %
Eosinophils Absolute: 0.7 10*3/uL — ABNORMAL HIGH (ref 0.0–0.5)
Eosinophils Relative: 11 %
HCT: 37 % — ABNORMAL LOW (ref 39.0–52.0)
Hemoglobin: 12.6 g/dL — ABNORMAL LOW (ref 13.0–17.0)
Immature Granulocytes: 0 %
Lymphocytes Relative: 16 %
Lymphs Abs: 1 10*3/uL (ref 0.7–4.0)
MCH: 34.4 pg — ABNORMAL HIGH (ref 26.0–34.0)
MCHC: 34.1 g/dL (ref 30.0–36.0)
MCV: 101.1 fL — ABNORMAL HIGH (ref 80.0–100.0)
Monocytes Absolute: 0.6 10*3/uL (ref 0.1–1.0)
Monocytes Relative: 9 %
Neutro Abs: 4.1 10*3/uL (ref 1.7–7.7)
Neutrophils Relative %: 62 %
Platelet Count: 214 10*3/uL (ref 150–400)
RBC: 3.66 MIL/uL — ABNORMAL LOW (ref 4.22–5.81)
RDW: 14.6 % (ref 11.5–15.5)
WBC Count: 6.5 10*3/uL (ref 4.0–10.5)
nRBC: 0 % (ref 0.0–0.2)

## 2021-11-11 LAB — CMP (CANCER CENTER ONLY)
ALT: 26 U/L (ref 0–44)
AST: 17 U/L (ref 15–41)
Albumin: 3.6 g/dL (ref 3.5–5.0)
Alkaline Phosphatase: 109 U/L (ref 38–126)
Anion gap: 7 (ref 5–15)
BUN: 17 mg/dL (ref 8–23)
CO2: 24 mmol/L (ref 22–32)
Calcium: 8.7 mg/dL — ABNORMAL LOW (ref 8.9–10.3)
Chloride: 106 mmol/L (ref 98–111)
Creatinine: 0.88 mg/dL (ref 0.61–1.24)
GFR, Estimated: >60 mL/min (ref >60)
Glucose, Bld: 112 mg/dL — ABNORMAL HIGH (ref 70–99)
Potassium: 4.3 mmol/L (ref 3.5–5.1)
Sodium: 137 mmol/L (ref 135–145)
Total Bilirubin: 0.4 mg/dL (ref 0.3–1.2)
Total Protein: 6.5 g/dL (ref 6.5–8.1)

## 2021-11-11 LAB — LACTATE DEHYDROGENASE: LDH: 119 U/L (ref 98–192)

## 2021-11-11 MED ORDER — ACETAMINOPHEN 325 MG PO TABS
650.0000 mg | ORAL_TABLET | Freq: Once | ORAL | Status: AC
  Administered 2021-11-11: 650 mg via ORAL
  Filled 2021-11-11: qty 2

## 2021-11-11 MED ORDER — DARATUMUMAB-HYALURONIDASE-FIHJ 1800-30000 MG-UT/15ML ~~LOC~~ SOLN
1800.0000 mg | Freq: Once | SUBCUTANEOUS | Status: AC
  Administered 2021-11-11: 1800 mg via SUBCUTANEOUS
  Filled 2021-11-11: qty 15

## 2021-11-11 MED ORDER — DIPHENHYDRAMINE HCL 25 MG PO CAPS
50.0000 mg | ORAL_CAPSULE | Freq: Once | ORAL | Status: AC
  Administered 2021-11-11: 50 mg via ORAL
  Filled 2021-11-11: qty 2

## 2021-11-11 NOTE — Patient Instructions (Signed)
[facility]   Discharge Instructions: Thank you for choosing [facility] to provide your oncology and hematology care.   If you have a lab appointment with the Cancer Center, please go directly to the Cancer Center and check in at the registration area.   Wear comfortable clothing and clothing appropriate for easy access to any Portacath or PICC line.   We strive to give you quality time with your provider. You may need to reschedule your appointment if you arrive late (15 or more minutes).  Arriving late affects you and other patients whose appointments are after yours.  Also, if you miss three or more appointments without notifying the office, you may be dismissed from the clinic at the provider's discretion.      For prescription refill requests, have your pharmacy contact our office and allow 72 hours for refills to be completed.    Today you received the following chemotherapy and/or immunotherapy agents: daratumumab-hyaluronidase-fihj      To help prevent nausea and vomiting after your treatment, we encourage you to take your nausea medication as directed.  BELOW ARE SYMPTOMS THAT SHOULD BE REPORTED IMMEDIATELY: *FEVER GREATER THAN 100.4 F (38 C) OR HIGHER *CHILLS OR SWEATING *NAUSEA AND VOMITING THAT IS NOT CONTROLLED WITH YOUR NAUSEA MEDICATION *UNUSUAL SHORTNESS OF BREATH *UNUSUAL BRUISING OR BLEEDING *URINARY PROBLEMS (pain or burning when urinating, or frequent urination) *BOWEL PROBLEMS (unusual diarrhea, constipation, pain near the anus) TENDERNESS IN MOUTH AND THROAT WITH OR WITHOUT PRESENCE OF ULCERS (sore throat, sores in mouth, or a toothache) UNUSUAL RASH, SWELLING OR PAIN  UNUSUAL VAGINAL DISCHARGE OR ITCHING   Items with * indicate a potential emergency and should be followed up as soon as possible or go to the Emergency Department if any problems should occur.  Please show the CHEMOTHERAPY ALERT CARD or IMMUNOTHERAPY  ALERT CARD at check-in to the Emergency Department and triage nurse.  Should you have questions after your visit or need to cancel or reschedule your appointment, please contact [facility]  Dept: [phone]  and follow the prompts.  Office hours are 8:00 a.m. to 4:30 p.m. Monday - Friday. Please note that voicemails left after 4:00 p.m. may not be returned until the following business day.  We are closed weekends and major holidays. You have access to a nurse at all times for urgent questions. Please call the main number to the clinic Dept: [phone] and follow the prompts.   For any non-urgent questions, you may also contact your provider using MyChart. We now offer e-Visits for anyone 18 and older to request care online for non-urgent symptoms. For details visit [email or web address].   Also download the MyChart app! Go to the app store, search "MyChart", open the app, select , and log in with your MyChart username and password.  Due to Covid, a mask is required upon entering the hospital/clinic. If you do not have a mask, one will be given to you upon arrival. For doctor visits, patients may have 1 support person aged 18 or older with them. For treatment visits, patients cannot have anyone with them due to current Covid guidelines and our immunocompromised population.  

## 2021-11-11 NOTE — Progress Notes (Signed)
?[place] ?[phone]   [phone] ? ?PROGRESS NOTE ? ?Patient Care Team: ?Marin Olp, MD as PCP - General (Family Medicine) ?Jerline Pain, MD as PCP - Cardiology (Cardiology) ?[name], MD as Consulting Physician (General Surgery) ? ?Hematological/Oncological History ?# IgG Kappa Multiple Myeloma ?07/22/2021: noted to have elevated serum protein and Hgb 12.3 (mild anemia). Further workup showed SPEP M-Spike 4.2 g/dL with IgG-Kappa as monoclonal protein, IgG 5908 mg/dL ?09/08/2021: Initial evaluation at [facility]. bone marrow biopsy performed, showed 60% abnormal plasma cells in 60% cellular marrow. Labs showed kappa 35.28 mg/dL, lambda 0.77 mg/dL, K/L ratio 45.82 and M-Spike 4.48 g/dL, IgG kappa monoclonal gammopathy ?09/15/2021: CT skeletal survey showed no CT evidence of aggressive osseous lesions.  ?09/26/2021: transfer care to Dr. Lorenso Courier at [facility].  ?09/30/2021: Cycle 1 Day 1 of Dara/Dex ?10/06/2021: Cycle 1 Day 8 (added revlimid) ?10/28/2021: Cycle 2 Day 1 of Dara/Rev/Dex ? ?Interval History:  ?Wesley [name] 81 y.o. male with medical history significant for IgG kappa multiple myeloma who presents for a follow up visit. The patient's last visit was on 10/28/2021 with Dede Query and in the interim since the last visit he continued treatment with Dara/Revlimid/dexamethasone. ? ?On exam today Mr. Kimrey reports he is tolerating treatment well overall.  He is not having any major side effects as a result of his chemotherapy.  He denies any nausea, ming, or diarrhea.  He also denies any neuropathy of the hands or feet.  He notes that his weight has been steady and his appetite has been good.  Overall he is tolerating treatment well and has no questions concerns or complaints.  He denies fevers, chills, night sweats, shortness of breath, chest pain or cough. He has no other complaints. Full 10 point ROS is listed below. ? ?MEDICAL  HISTORY:  ?Past Medical History:  ?Diagnosis Date  ? 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 CAD (coronary artery disease)   ? Cholelithiasis   ? Chronic allergic rhinitis   ? Coronary atherosclerosis of native coronary artery   ? Fatty liver   ? GERD (gastroesophageal reflux disease)   ? HX 41yr ago, no longer a problem, no meds  ? High blood pressure   ? Hypercholesterolemia   ? 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 Multiple myeloma not having achieved remission ([facility] 09/26/2021  ? OSA on CPAP   ? Paraesophageal hernia   ? Partial bowel obstruction ([facility]   ? 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 Umbilical hernia without obstruction or gangrene   ? ? ?SURGICAL HISTORY: ?Past Surgical History:  ?Procedure Laterality Date  ? BIOPSY  10/29/2019  ? Procedure: BIOPSY;  Surgeon: PJerene Bears MD;  [facility];  Service: Gastroenterology;;  ? CARDIAC CATHETERIZATION  2000  ? Cath to rule out cardiac problems RT HTN. PT denies significant findings  ? CARDIAC CATHETERIZATION  2008  ? COLOSTOMY N/A 10/31/2019  ? Procedure: COLOSTOMY;  Surgeon: CErroll Luna MD;  [facility];  Service: General;  Laterality: N/A;  ? CYSTOSCOPY W/ URETERAL STENT PLACEMENT Bilateral 10/31/2019  ? Procedure: CYSTOSCOPY URETERAL STENT PLACEMENT BILATERAL;  Surgeon: DFranchot Gallo MD;  [facility];  Service: Urology;  Laterality: Bilateral;  ? CYSTOSCOPY WITH STENT PLACEMENT Bilateral 03/11/2020  ? Procedure: CYSTOSCOPY WITH BILATERAL FIREFLY INJECTION;  Surgeon: WIrine Seal MD;  [facility];  Service: Urology;  Laterality: Bilateral;  ?  EYE SURGERY    ? BILATERAL CATARACT SURGERY WITH LENS IMPLANTS  ? FLEXIBLE SIGMOIDOSCOPY N/A 10/29/2019  ? Procedure: FLEXIBLE SIGMOIDOSCOPY;  Surgeon: Jerene Bears, MD;  [facility];  Service: Gastroenterology;  Laterality: N/A;  ? INCISIONAL HERNIA REPAIR N/A 10/13/2020  ? Procedure: OPEN INCISIONAL HERNIA REPAIR WITH MESH;   Surgeon: [name], MD;  Location: [facility];  Service: General;  Laterality: N/A;  ? INSERTION OF MESH N/A 09/08/2016  ? Procedure: INSERTION OF MESH;  Surgeon: Jackolyn Confer, MD;  [facility];  Service: General;  Laterality: N/A;  ? IR RADIOLOGIST EVAL & MGMT  11/10/2020  ? IR RADIOLOGIST EVAL & MGMT  11/25/2020  ? LAPAROSCOPIC SIGMOID COLECTOMY N/A 10/31/2019  ? Procedure: DIAGNOSTIC LAPAROSCOPY; EXPLORATORY LAPAROTOMY; SIGMOID COLECTOMY;  Surgeon: Erroll Luna, MD;  [facility];  Service: General;  Laterality: N/A;  ? right rotator cuff    ? SUBMUCOSAL TATTOO INJECTION  10/29/2019  ? Procedure: SUBMUCOSAL TATTOO INJECTION;  Surgeon: Jerene Bears, MD;  [facility];  Service: Gastroenterology;;  ? TONSILLECTOMY    ? UMBILICAL HERNIA REPAIR N/A 09/08/2016  ? Procedure: OPEN UMBILICAL HERNIA REPAIR WITH MESH;  Surgeon: Jackolyn Confer, MD;  [facility];  Service: General;  Laterality: N/A;  ? VENTRAL HERNIA REPAIR N/A 02/03/2021  ? Procedure: LAPAROSCOPIC VENTRAL HERNIA REPAIR WITH MESH;  Surgeon: [name], MD;  Location: [facility];  Service: General;  Laterality: N/A;  ? XI ROBOTIC ASSISTED COLOSTOMY TAKEDOWN N/A 03/11/2020  ? Procedure: ROBOTIC ASSISTED COLOSTOMY REVERSAL, RIGID PROCTOSCOPY;  Surgeon: Leighton Ruff, MD;  [facility];  Service: General;  Laterality: N/A;  ? ? ?SOCIAL HISTORY: ?Social History  ? ?Socioeconomic History  ? Marital status: Married  ?  Spouse name: Estefan Pattison   ? Number of children: 2  ? Years of education: 29  ? Highest education level: Master's degree (e.g., MA, MS, MEng, MEd, MSW, MBA)  ?Occupational History  ? Occupation: Retired   ?Tobacco Use  ? Smoking status: Never  ? Smokeless tobacco: Never  ?Vaping Use  ? Vaping Use: Never used  ?Substance and Sexual Activity  ? Alcohol use: Yes  ?  Alcohol/week: 17.0 standard drinks  ?  Types: 3 Glasses of wine, 14 Standard drinks or equivalent per week  ? Drug use: No  ? Sexual activity: Not  Currently  ?Other Topics Concern  ? Not on file  ?Social History Narrative  ? Married. 2 children 52 in 61 in 2022- both went to [employer]. 5 grandkids- all girls from oldest 86 looking premed [employer], 73 soph [employer], 54 79 year olds, 18 year olds.   ?   ? Retired Automotive engineer- Estate manager/land agent  ? 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 Undergrad and grad school at [employer]  ?   ? Hobbies: fishing, [place] football and basketball, [place], yardwork  ? ?Social Determinants of Health  ? ?Financial Resource Strain: Low Risk   ? Difficulty of Paying Living Expenses: Not hard at all  ?Food Insecurity: No Food Insecurity  ? Worried About Charity fundraiser in the Last Year: Never true  ? Ran Out of Food in the Last Year: Never true  ?Transportation Needs: No Transportation Needs  ? Lack of Transportation (Medical): No  ? Lack of Transportation (Non-Medical): No  ?Physical Activity: Inactive  ? Days of Exercise per Week: 0 days  ? Minutes of Exercise per Session: 0 min  ?  Stress: No Stress Concern Present  ? Feeling of Stress : Not at all  ?Social Connections: Socially Integrated  ? Frequency of Communication with Friends and Family: More than three times a week  ? Frequency of Social Gatherings with Friends and Family: Once a week  ? Attends Religious Services: More than 4 times per year  ? Active Member of Clubs or Organizations: Yes  ? Attends Archivist Meetings: More than 4 times per year  ? Marital Status: Married  ?Intimate Partner Violence: Not At Risk  ? Fear of Current or Ex-Partner: No  ? Emotionally Abused: No  ? Physically Abused: No  ? Sexually Abused: No  ? ? ?FAMILY HISTORY: ?Family History  ?Problem Relation Age of Onset  ? Diabetes Mother   ? Heart failure Mother   ?     around 73  ? CAD Father   ?     43  ? Melanoma Brother 5  ? Healthy Daughter   ? Healthy Son   ? Colon cancer Neg Hx   ? Esophageal cancer Neg Hx   ? Rectal cancer Neg Hx   ? Stomach cancer  Neg Hx   ? ? ?ALLERGIES:  is allergic to demerol, promethazine hcl, and ativan [lorazepam]. ? ?MEDICATIONS:  ?Current Outpatient Medications  ?Medication Sig Dispense Refill  ? acetaminophen (TYLENOL) 500 MG tablet

## 2021-11-11 NOTE — Progress Notes (Signed)
Patient states he took decadron at home prior to coming to infusion room.  ?

## 2021-11-18 ENCOUNTER — Ambulatory Visit: Payer: Medicare Other | Admitting: Hematology and Oncology

## 2021-11-18 ENCOUNTER — Inpatient Hospital Stay: Payer: Medicare Other | Attending: Hematology and Oncology

## 2021-11-18 ENCOUNTER — Other Ambulatory Visit: Payer: Self-pay

## 2021-11-18 ENCOUNTER — Inpatient Hospital Stay: Payer: Medicare Other

## 2021-11-18 VITALS — BP 134/62 | HR 70 | Temp 98.0°F | Resp 17 | Wt 198.0 lb

## 2021-11-18 DIAGNOSIS — Z5111 Encounter for antineoplastic chemotherapy: Secondary | ICD-10-CM | POA: Insufficient documentation

## 2021-11-18 DIAGNOSIS — D649 Anemia, unspecified: Secondary | ICD-10-CM | POA: Diagnosis not present

## 2021-11-18 DIAGNOSIS — Z79899 Other long term (current) drug therapy: Secondary | ICD-10-CM | POA: Diagnosis not present

## 2021-11-18 DIAGNOSIS — C9000 Multiple myeloma not having achieved remission: Secondary | ICD-10-CM

## 2021-11-18 LAB — CBC WITH DIFFERENTIAL (CANCER CENTER ONLY)
Abs Immature Granulocytes: 0.03 10*3/uL (ref 0.00–0.07)
Basophils Absolute: 0.1 10*3/uL (ref 0.0–0.1)
Basophils Relative: 1 %
Eosinophils Absolute: 0.7 10*3/uL — ABNORMAL HIGH (ref 0.0–0.5)
Eosinophils Relative: 9 %
HCT: 36.5 % — ABNORMAL LOW (ref 39.0–52.0)
Hemoglobin: 12.3 g/dL — ABNORMAL LOW (ref 13.0–17.0)
Immature Granulocytes: 0 %
Lymphocytes Relative: 7 %
Lymphs Abs: 0.6 10*3/uL — ABNORMAL LOW (ref 0.7–4.0)
MCH: 34.7 pg — ABNORMAL HIGH (ref 26.0–34.0)
MCHC: 33.7 g/dL (ref 30.0–36.0)
MCV: 103.1 fL — ABNORMAL HIGH (ref 80.0–100.0)
Monocytes Absolute: 0.9 10*3/uL (ref 0.1–1.0)
Monocytes Relative: 11 %
Neutro Abs: 6 10*3/uL (ref 1.7–7.7)
Neutrophils Relative %: 72 %
Platelet Count: 146 10*3/uL — ABNORMAL LOW (ref 150–400)
RBC: 3.54 MIL/uL — ABNORMAL LOW (ref 4.22–5.81)
RDW: 14.5 % (ref 11.5–15.5)
WBC Count: 8.5 10*3/uL (ref 4.0–10.5)
nRBC: 0 % (ref 0.0–0.2)

## 2021-11-18 LAB — CMP (CANCER CENTER ONLY)
ALT: 22 U/L (ref 0–44)
AST: 15 U/L (ref 15–41)
Albumin: 3.7 g/dL (ref 3.5–5.0)
Alkaline Phosphatase: 98 U/L (ref 38–126)
Anion gap: 6 (ref 5–15)
BUN: 18 mg/dL (ref 8–23)
CO2: 28 mmol/L (ref 22–32)
Calcium: 8.8 mg/dL — ABNORMAL LOW (ref 8.9–10.3)
Chloride: 104 mmol/L (ref 98–111)
Creatinine: 0.97 mg/dL (ref 0.61–1.24)
GFR, Estimated: >60 mL/min (ref >60)
Glucose, Bld: 108 mg/dL — ABNORMAL HIGH (ref 70–99)
Potassium: 4.3 mmol/L (ref 3.5–5.1)
Sodium: 138 mmol/L (ref 135–145)
Total Bilirubin: 0.9 mg/dL (ref 0.3–1.2)
Total Protein: 6.5 g/dL (ref 6.5–8.1)

## 2021-11-18 LAB — LACTATE DEHYDROGENASE: LDH: 117 U/L (ref 98–192)

## 2021-11-18 MED ORDER — DARATUMUMAB-HYALURONIDASE-FIHJ 1800-30000 MG-UT/15ML ~~LOC~~ SOLN
1800.0000 mg | Freq: Once | SUBCUTANEOUS | Status: AC
  Administered 2021-11-18: 1800 mg via SUBCUTANEOUS
  Filled 2021-11-18: qty 15

## 2021-11-18 MED ORDER — DIPHENHYDRAMINE HCL 25 MG PO CAPS
50.0000 mg | ORAL_CAPSULE | Freq: Once | ORAL | Status: AC
  Administered 2021-11-18: 50 mg via ORAL
  Filled 2021-11-18: qty 2

## 2021-11-18 MED ORDER — ACETAMINOPHEN 325 MG PO TABS
650.0000 mg | ORAL_TABLET | Freq: Once | ORAL | Status: AC
  Administered 2021-11-18: 650 mg via ORAL
  Filled 2021-11-18: qty 2

## 2021-11-18 NOTE — Patient Instructions (Signed)
[facility]   Discharge Instructions: Thank you for choosing [facility] to provide your oncology and hematology care.   If you have a lab appointment with the Cancer Center, please go directly to the Cancer Center and check in at the registration area.   Wear comfortable clothing and clothing appropriate for easy access to any Portacath or PICC line.   We strive to give you quality time with your provider. You may need to reschedule your appointment if you arrive late (15 or more minutes).  Arriving late affects you and other patients whose appointments are after yours.  Also, if you miss three or more appointments without notifying the office, you may be dismissed from the clinic at the provider's discretion.      For prescription refill requests, have your pharmacy contact our office and allow 72 hours for refills to be completed.    Today you received the following chemotherapy and/or immunotherapy agents: daratumumab-hyaluronidase-fihj      To help prevent nausea and vomiting after your treatment, we encourage you to take your nausea medication as directed.  BELOW ARE SYMPTOMS THAT SHOULD BE REPORTED IMMEDIATELY: *FEVER GREATER THAN 100.4 F (38 C) OR HIGHER *CHILLS OR SWEATING *NAUSEA AND VOMITING THAT IS NOT CONTROLLED WITH YOUR NAUSEA MEDICATION *UNUSUAL SHORTNESS OF BREATH *UNUSUAL BRUISING OR BLEEDING *URINARY PROBLEMS (pain or burning when urinating, or frequent urination) *BOWEL PROBLEMS (unusual diarrhea, constipation, pain near the anus) TENDERNESS IN MOUTH AND THROAT WITH OR WITHOUT PRESENCE OF ULCERS (sore throat, sores in mouth, or a toothache) UNUSUAL RASH, SWELLING OR PAIN  UNUSUAL VAGINAL DISCHARGE OR ITCHING   Items with * indicate a potential emergency and should be followed up as soon as possible or go to the Emergency Department if any problems should occur.  Please show the CHEMOTHERAPY ALERT CARD or IMMUNOTHERAPY  ALERT CARD at check-in to the Emergency Department and triage nurse.  Should you have questions after your visit or need to cancel or reschedule your appointment, please contact [facility]  Dept: [phone]  and follow the prompts.  Office hours are 8:00 a.m. to 4:30 p.m. Monday - Friday. Please note that voicemails left after 4:00 p.m. may not be returned until the following business day.  We are closed weekends and major holidays. You have access to a nurse at all times for urgent questions. Please call the main number to the clinic Dept: [phone] and follow the prompts.   For any non-urgent questions, you may also contact your provider using MyChart. We now offer e-Visits for anyone 18 and older to request care online for non-urgent symptoms. For details visit [email or web address].   Also download the MyChart app! Go to the app store, search "MyChart", open the app, select [place] [facility], and log in with your MyChart username and password.  Due to Covid, a mask is required upon entering the hospital/clinic. If you do not have a mask, one will be given to you upon arrival. For doctor visits, patients may have 1 support person aged 18 or older with them. For treatment visits, patients cannot have anyone with them due to current Covid guidelines and our immunocompromised population.  

## 2021-11-20 ENCOUNTER — Encounter: Payer: Self-pay | Admitting: Hematology and Oncology

## 2021-11-23 ENCOUNTER — Telehealth: Payer: Self-pay | Admitting: *Deleted

## 2021-11-23 ENCOUNTER — Other Ambulatory Visit: Payer: Self-pay | Admitting: *Deleted

## 2021-11-23 DIAGNOSIS — C9000 Multiple myeloma not having achieved remission: Secondary | ICD-10-CM

## 2021-11-23 MED ORDER — LENALIDOMIDE 25 MG PO CAPS: 25.0000 mg | ORAL_CAPSULE | Freq: Every day | ORAL | 0 refills | Status: DC

## 2021-11-23 NOTE — Telephone Encounter (Signed)
TCT patient to advise that his Revlimid refill was sent in to his specialty pharmacy today. Pt voiced understanding ?

## 2021-11-25 ENCOUNTER — Inpatient Hospital Stay: Payer: Medicare Other

## 2021-11-25 ENCOUNTER — Other Ambulatory Visit: Payer: Self-pay

## 2021-11-25 ENCOUNTER — Inpatient Hospital Stay (HOSPITAL_BASED_OUTPATIENT_CLINIC_OR_DEPARTMENT_OTHER): Payer: Medicare Other | Admitting: Hematology and Oncology

## 2021-11-25 VITALS — BP 118/65 | HR 88 | Temp 98.2°F | Resp 19 | Ht 68.0 in | Wt 197.8 lb

## 2021-11-25 DIAGNOSIS — D539 Nutritional anemia, unspecified: Secondary | ICD-10-CM

## 2021-11-25 DIAGNOSIS — C9000 Multiple myeloma not having achieved remission: Secondary | ICD-10-CM

## 2021-11-25 DIAGNOSIS — Z79899 Other long term (current) drug therapy: Secondary | ICD-10-CM | POA: Diagnosis not present

## 2021-11-25 DIAGNOSIS — Z5111 Encounter for antineoplastic chemotherapy: Secondary | ICD-10-CM | POA: Diagnosis not present

## 2021-11-25 DIAGNOSIS — D649 Anemia, unspecified: Secondary | ICD-10-CM | POA: Diagnosis not present

## 2021-11-25 LAB — CMP (CANCER CENTER ONLY)
ALT: 33 U/L (ref 0–44)
AST: 20 U/L (ref 15–41)
Albumin: 3.8 g/dL (ref 3.5–5.0)
Alkaline Phosphatase: 97 U/L (ref 38–126)
Anion gap: 5 (ref 5–15)
BUN: 18 mg/dL (ref 8–23)
CO2: 28 mmol/L (ref 22–32)
Calcium: 8.9 mg/dL (ref 8.9–10.3)
Chloride: 102 mmol/L (ref 98–111)
Creatinine: 0.96 mg/dL (ref 0.61–1.24)
GFR, Estimated: >60 mL/min (ref >60)
Glucose, Bld: 119 mg/dL — ABNORMAL HIGH (ref 70–99)
Potassium: 4.5 mmol/L (ref 3.5–5.1)
Sodium: 135 mmol/L (ref 135–145)
Total Bilirubin: 0.6 mg/dL (ref 0.3–1.2)
Total Protein: 6.3 g/dL — ABNORMAL LOW (ref 6.5–8.1)

## 2021-11-25 LAB — CBC WITH DIFFERENTIAL (CANCER CENTER ONLY)
Abs Immature Granulocytes: 0.02 10*3/uL (ref 0.00–0.07)
Basophils Absolute: 0.1 10*3/uL (ref 0.0–0.1)
Basophils Relative: 1 %
Eosinophils Absolute: 1 10*3/uL — ABNORMAL HIGH (ref 0.0–0.5)
Eosinophils Relative: 23 %
HCT: 37.5 % — ABNORMAL LOW (ref 39.0–52.0)
Hemoglobin: 12.6 g/dL — ABNORMAL LOW (ref 13.0–17.0)
Immature Granulocytes: 1 %
Lymphocytes Relative: 18 %
Lymphs Abs: 0.8 10*3/uL (ref 0.7–4.0)
MCH: 34.3 pg — ABNORMAL HIGH (ref 26.0–34.0)
MCHC: 33.6 g/dL (ref 30.0–36.0)
MCV: 102.2 fL — ABNORMAL HIGH (ref 80.0–100.0)
Monocytes Absolute: 0.5 10*3/uL (ref 0.1–1.0)
Monocytes Relative: 12 %
Neutro Abs: 2 10*3/uL (ref 1.7–7.7)
Neutrophils Relative %: 45 %
Platelet Count: 164 10*3/uL (ref 150–400)
RBC: 3.67 MIL/uL — ABNORMAL LOW (ref 4.22–5.81)
RDW: 14.5 % (ref 11.5–15.5)
WBC Count: 4.4 10*3/uL (ref 4.0–10.5)
nRBC: 0 % (ref 0.0–0.2)

## 2021-11-25 LAB — LACTATE DEHYDROGENASE: LDH: 119 U/L (ref 98–192)

## 2021-11-25 MED ORDER — DARATUMUMAB-HYALURONIDASE-FIHJ 1800-30000 MG-UT/15ML ~~LOC~~ SOLN
1800.0000 mg | Freq: Once | SUBCUTANEOUS | Status: AC
  Administered 2021-11-25: 1800 mg via SUBCUTANEOUS
  Filled 2021-11-25: qty 15

## 2021-11-25 MED ORDER — ACETAMINOPHEN 325 MG PO TABS
650.0000 mg | ORAL_TABLET | Freq: Once | ORAL | Status: AC
  Administered 2021-11-25: 650 mg via ORAL
  Filled 2021-11-25: qty 2

## 2021-11-25 MED ORDER — DIPHENHYDRAMINE HCL 25 MG PO CAPS
50.0000 mg | ORAL_CAPSULE | Freq: Once | ORAL | Status: AC
  Administered 2021-11-25: 50 mg via ORAL
  Filled 2021-11-25: qty 2

## 2021-11-25 NOTE — Progress Notes (Signed)
Pt states he took dex at home this morning before his appt. ?

## 2021-11-25 NOTE — Patient Instructions (Signed)
[facility]  Discharge Instructions: Thank you for choosing [facility] to provide your oncology and hematology care.   If you have a lab appointment with the Cancer Center, please go directly to the Cancer Center and check in at the registration area.   Wear comfortable clothing and clothing appropriate for easy access to any Portacath or PICC line.   We strive to give you quality time with your provider. You may need to reschedule your appointment if you arrive late (15 or more minutes).  Arriving late affects you and other patients whose appointments are after yours.  Also, if you miss three or more appointments without notifying the office, you may be dismissed from the clinic at the provider's discretion.      For prescription refill requests, have your pharmacy contact our office and allow 72 hours for refills to be completed.    Today you received the following chemotherapy and/or immunotherapy agents: Darzalex Faspro      To help prevent nausea and vomiting after your treatment, we encourage you to take your nausea medication as directed.  BELOW ARE SYMPTOMS THAT SHOULD BE REPORTED IMMEDIATELY: *FEVER GREATER THAN 100.4 F (38 C) OR HIGHER *CHILLS OR SWEATING *NAUSEA AND VOMITING THAT IS NOT CONTROLLED WITH YOUR NAUSEA MEDICATION *UNUSUAL SHORTNESS OF BREATH *UNUSUAL BRUISING OR BLEEDING *URINARY PROBLEMS (pain or burning when urinating, or frequent urination) *BOWEL PROBLEMS (unusual diarrhea, constipation, pain near the anus) TENDERNESS IN MOUTH AND THROAT WITH OR WITHOUT PRESENCE OF ULCERS (sore throat, sores in mouth, or a toothache) UNUSUAL RASH, SWELLING OR PAIN  UNUSUAL VAGINAL DISCHARGE OR ITCHING   Items with * indicate a potential emergency and should be followed up as soon as possible or go to the Emergency Department if any problems should occur.  Please show the CHEMOTHERAPY ALERT CARD or IMMUNOTHERAPY ALERT CARD at  check-in to the Emergency Department and triage nurse.  Should you have questions after your visit or need to cancel or reschedule your appointment, please contact [facility]  Dept: [phone]  and follow the prompts.  Office hours are 8:00 a.m. to 4:30 p.m. Monday - Friday. Please note that voicemails left after 4:00 p.m. may not be returned until the following business day.  We are closed weekends and major holidays. You have access to a nurse at all times for urgent questions. Please call the main number to the clinic Dept: [phone] and follow the prompts.   For any non-urgent questions, you may also contact your provider using MyChart. We now offer e-Visits for anyone 18 and older to request care online for non-urgent symptoms. For details visit [email or web address].   Also download the MyChart app! Go to the app store, search "MyChart", open the app, select [facility], and log in with your MyChart username and password.  Due to Covid, a mask is required upon entering the hospital/clinic. If you do not have a mask, one will be given to you upon arrival. For doctor visits, patients may have 1 support person aged 18 or older with them. For treatment visits, patients cannot have anyone with them due to current Covid guidelines and our immunocompromised population.  

## 2021-11-25 NOTE — Progress Notes (Signed)
?[place] ?Telephone:(336) [phone]   [phone] ? ?PROGRESS NOTE ? ?Patient Care Team: ?Marin Olp, MD as PCP - General (Family Medicine) ?Jerline Pain, MD as PCP - Cardiology (Cardiology) ?[name], MD as Consulting Physician (General Surgery) ? ?Hematological/Oncological History ?# IgG Kappa Multiple Myeloma ?07/22/2021: noted to have elevated serum protein and Hgb 12.3 (mild anemia). Further workup showed SPEP M-Spike 4.2 g/dL with IgG-Kappa as monoclonal protein, IgG 5908 mg/dL ?09/08/2021: Initial evaluation at [facility]. bone marrow biopsy performed, showed 60% abnormal plasma cells in 60% cellular marrow. Labs showed kappa 35.28 mg/dL, lambda 0.77 mg/dL, K/L ratio 45.82 and M-Spike 4.48 g/dL, IgG kappa monoclonal gammopathy ?09/15/2021: CT skeletal survey showed no CT evidence of aggressive osseous lesions.  ?09/26/2021: transfer care to Dr. Lorenso Courier at [facility].  ?09/30/2021: Cycle 1 Day 1 of Dara/Dex ?10/06/2021: Cycle 1 Day 8 (added revlimid) ?10/28/2021: Cycle 2 Day 1 of Dara/Rev/Dex ?11/25/2021: Cycle 3 Day 1 of Dara/Rev/Dex ? ?Interval History:  ?Wesley [name] 81 y.o. male with medical history significant for IgG kappa multiple myeloma who presents for a follow up visit. The patient's last visit was on 11/11/2021. In the interim since the last visit he continued treatment with Dara/Revlimid/dexamethasone. ? ?On exam today [name] is accompanied by his wife.  He reports that he feels well overall but that he does still have a "postinfusion downer".  He notes that this tends to occur on Mondays.  He is not having any other major side effects as a result of his pills or shots.  He notes he does have some occasional episodes of peripheral neuropathy but that this is improving.  He notes his energy levels are "low".  Overall he remains optimistic and is willing and able to proceed with treatment at this time.  He denies fevers, chills,  night sweats, shortness of breath, chest pain or cough. He has no other complaints. Full 10 point ROS is listed below. ? ?MEDICAL HISTORY:  ?Past Medical History:  ?Diagnosis Date  ? 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 CAD (coronary artery disease)   ? Cholelithiasis   ? Chronic allergic rhinitis   ? Coronary atherosclerosis of native coronary artery   ? Fatty liver   ? GERD (gastroesophageal reflux disease)   ? HX 53yr ago, no longer a problem, no meds  ? High blood pressure   ? Hypercholesterolemia   ? 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 Multiple [place] not having achieved remission ([place] 09/26/2021  ? OSA on CPAP   ? Paraesophageal hernia   ? [place] bowel obstruction ([place]   ? 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 Umbilical hernia without obstruction or gangrene   ? ? ?SURGICAL HISTORY: ?Past Surgical History:  ?Procedure Laterality Date  ? BIOPSY  10/29/2019  ? Procedure: BIOPSY;  Surgeon: PJerene Bears MD;  [facility];  Service: Gastroenterology;;  ? CARDIAC CATHETERIZATION  2000  ? Cath to rule out cardiac problems RT HTN. PT denies significant findings  ? CARDIAC CATHETERIZATION  2008  ? COLOSTOMY N/A 10/31/2019  ? Procedure: COLOSTOMY;  Surgeon: CErroll Luna MD;  [facility];  Service: General;  Laterality: N/A;  ? CYSTOSCOPY W/ URETERAL STENT PLACEMENT Bilateral 10/31/2019  ? Procedure: CYSTOSCOPY URETERAL STENT PLACEMENT BILATERAL;  Surgeon: DFranchot Gallo MD;  [facility];  Service: Urology;  Laterality: Bilateral;  ? CYSTOSCOPY WITH STENT PLACEMENT Bilateral 03/11/2020  ?  Procedure: CYSTOSCOPY WITH BILATERAL FIREFLY INJECTION;  Surgeon: Irine Seal, MD;  [facility];  Service: Urology;  Laterality: Bilateral;  ? EYE SURGERY    ? BILATERAL CATARACT SURGERY WITH LENS IMPLANTS  ? FLEXIBLE SIGMOIDOSCOPY N/A 10/29/2019  ? Procedure: FLEXIBLE SIGMOIDOSCOPY;  Surgeon: Jerene Bears, MD;  [facility];  Service:  Gastroenterology;  Laterality: N/A;  ? INCISIONAL HERNIA REPAIR N/A 10/13/2020  ? Procedure: OPEN INCISIONAL HERNIA REPAIR WITH MESH;  Surgeon: [name], MD;  Location: [facility];  Service: General;  Laterality: N/A;  ? INSERTION OF MESH N/A 09/08/2016  ? Procedure: INSERTION OF MESH;  Surgeon: Jackolyn Confer, MD;  [facility];  Service: General;  Laterality: N/A;  ? IR RADIOLOGIST EVAL & MGMT  11/10/2020  ? IR RADIOLOGIST EVAL & MGMT  11/25/2020  ? LAPAROSCOPIC SIGMOID COLECTOMY N/A 10/31/2019  ? Procedure: DIAGNOSTIC LAPAROSCOPY; EXPLORATORY LAPAROTOMY; SIGMOID COLECTOMY;  Surgeon: Erroll Luna, MD;  [facility];  Service: General;  Laterality: N/A;  ? right rotator cuff    ? SUBMUCOSAL TATTOO INJECTION  10/29/2019  ? Procedure: SUBMUCOSAL TATTOO INJECTION;  Surgeon: Jerene Bears, MD;  [facility];  Service: Gastroenterology;;  ? TONSILLECTOMY    ? UMBILICAL HERNIA REPAIR N/A 09/08/2016  ? Procedure: OPEN UMBILICAL HERNIA REPAIR WITH MESH;  Surgeon: Jackolyn Confer, MD;  [facility];  Service: General;  Laterality: N/A;  ? VENTRAL HERNIA REPAIR N/A 02/03/2021  ? Procedure: LAPAROSCOPIC VENTRAL HERNIA REPAIR WITH MESH;  Surgeon: [name], MD;  Location: [facility];  Service: General;  Laterality: N/A;  ? XI ROBOTIC ASSISTED COLOSTOMY TAKEDOWN N/A 03/11/2020  ? Procedure: ROBOTIC ASSISTED COLOSTOMY REVERSAL, RIGID PROCTOSCOPY;  Surgeon: Leighton Ruff, MD;  [facility];  Service: General;  Laterality: N/A;  ? ? ?SOCIAL HISTORY: ?Social History  ? ?Socioeconomic History  ? Marital status: Married  ?  Spouse name: Dempsy Damiano   ? Number of children: 2  ? Years of education: 16  ? Highest education level: Master's degree (e.g., MA, MS, MEng, MEd, MSW, MBA)  ?Occupational History  ? Occupation: Retired   ?Tobacco Use  ? Smoking status: Never  ? Smokeless tobacco: Never  ?Vaping Use  ? Vaping Use: Never used  ?Substance and Sexual Activity  ? Alcohol use: Yes  ?  Alcohol/week: 17.0  standard drinks  ?  Types: 3 Glasses of wine, 14 Standard drinks or equivalent per week  ? Drug use: No  ? Sexual activity: Not Currently  ?Other Topics Concern  ? Not on file  ?Social History Narrative  ? Married. 2 children 52 in 30 in 2022- both went to [employer]. 5 grandkids- all girls from oldest 21 looking premed [employer], 67 soph [place], 75 108 year olds, 81 year olds.   ?   ? Retired Automotive engineer- Estate manager/land agent  ? 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 Undergrad and grad school at [employer]  ?   ? Hobbies: fishing, [place] football and basketball, [place], yardwork  ? ?Social Determinants of Health  ? ?Financial Resource Strain: Low Risk   ? Difficulty of Paying Living Expenses: Not hard at all  ?Food Insecurity: No Food Insecurity  ? Worried About Charity fundraiser in the Last Year: Never true  ? Ran Out of Food in the Last Year: Never true  ?Transportation Needs: No Transportation Needs  ? Lack of Transportation (Medical): No  ? Lack of Transportation (Non-Medical): No  ?  Physical Activity: Inactive  ? Days of Exercise per Week: 0 days  ? Minutes of Exercise per Session: 0 min  ?Stress: No Stress Concern Present  ? Feeling of Stress : Not at all  ?Social Connections: Socially Integrated  ? Frequency of Communication with Friends and Family: More than three times a week  ? Frequency of Social Gatherings with Friends and Family: Once a week  ? Attends Religious Services: More than 4 times per year  ? Active Member of Clubs or Organizations: Yes  ? Attends Archivist Meetings: More than 4 times per year  ? Marital Status: Married  ?Intimate Partner Violence: Not At Risk  ? Fear of Current or Ex-Partner: No  ? Emotionally Abused: No  ? Physically Abused: No  ? Sexually Abused: No  ? ? ?FAMILY HISTORY: ?Family History  ?Problem Relation Age of Onset  ? Diabetes Mother   ? Heart failure Mother   ?     around 63  ? CAD Father   ?     46  ? Melanoma Brother 72  ?  Healthy Daughter   ? Healthy Son   ? Colon cancer Neg Hx   ? Esophageal cancer Neg Hx   ? Rectal cancer Neg Hx   ? Stomach cancer Neg Hx   ? ? ?ALLERGIES:  is allergic to demerol, promethazine hcl, and ativan [lorazep

## 2021-12-09 ENCOUNTER — Other Ambulatory Visit: Payer: Self-pay | Admitting: Physician Assistant

## 2021-12-09 ENCOUNTER — Inpatient Hospital Stay: Payer: Medicare Other

## 2021-12-09 ENCOUNTER — Other Ambulatory Visit: Payer: Self-pay

## 2021-12-09 ENCOUNTER — Inpatient Hospital Stay (HOSPITAL_BASED_OUTPATIENT_CLINIC_OR_DEPARTMENT_OTHER): Payer: Medicare Other | Admitting: Physician Assistant

## 2021-12-09 VITALS — BP 119/64 | HR 87 | Temp 97.9°F | Resp 18 | Wt 198.3 lb

## 2021-12-09 DIAGNOSIS — C9000 Multiple myeloma not having achieved remission: Secondary | ICD-10-CM

## 2021-12-09 DIAGNOSIS — D649 Anemia, unspecified: Secondary | ICD-10-CM | POA: Diagnosis not present

## 2021-12-09 DIAGNOSIS — Z79899 Other long term (current) drug therapy: Secondary | ICD-10-CM | POA: Diagnosis not present

## 2021-12-09 DIAGNOSIS — Z5111 Encounter for antineoplastic chemotherapy: Secondary | ICD-10-CM | POA: Diagnosis not present

## 2021-12-09 LAB — CBC WITH DIFFERENTIAL (CANCER CENTER ONLY)
Abs Immature Granulocytes: 0.03 10*3/uL (ref 0.00–0.07)
Basophils Absolute: 0.1 10*3/uL (ref 0.0–0.1)
Basophils Relative: 2 %
Eosinophils Absolute: 0.3 10*3/uL (ref 0.0–0.5)
Eosinophils Relative: 5 %
HCT: 39 % (ref 39.0–52.0)
Hemoglobin: 13.3 g/dL (ref 13.0–17.0)
Immature Granulocytes: 1 %
Lymphocytes Relative: 10 %
Lymphs Abs: 0.6 10*3/uL — ABNORMAL LOW (ref 0.7–4.0)
MCH: 34.6 pg — ABNORMAL HIGH (ref 26.0–34.0)
MCHC: 34.1 g/dL (ref 30.0–36.0)
MCV: 101.6 fL — ABNORMAL HIGH (ref 80.0–100.0)
Monocytes Absolute: 0.3 10*3/uL (ref 0.1–1.0)
Monocytes Relative: 5 %
Neutro Abs: 4.2 10*3/uL (ref 1.7–7.7)
Neutrophils Relative %: 77 %
Platelet Count: 175 10*3/uL (ref 150–400)
RBC: 3.84 MIL/uL — ABNORMAL LOW (ref 4.22–5.81)
RDW: 15 % (ref 11.5–15.5)
WBC Count: 5.4 10*3/uL (ref 4.0–10.5)
nRBC: 0 % (ref 0.0–0.2)

## 2021-12-09 LAB — CMP (CANCER CENTER ONLY)
ALT: 27 U/L (ref 0–44)
AST: 19 U/L (ref 15–41)
Albumin: 4 g/dL (ref 3.5–5.0)
Alkaline Phosphatase: 108 U/L (ref 38–126)
Anion gap: 5 (ref 5–15)
BUN: 13 mg/dL (ref 8–23)
CO2: 27 mmol/L (ref 22–32)
Calcium: 9.2 mg/dL (ref 8.9–10.3)
Chloride: 103 mmol/L (ref 98–111)
Creatinine: 0.98 mg/dL (ref 0.61–1.24)
GFR, Estimated: >60 mL/min (ref >60)
Glucose, Bld: 134 mg/dL — ABNORMAL HIGH (ref 70–99)
Potassium: 4.1 mmol/L (ref 3.5–5.1)
Sodium: 135 mmol/L (ref 135–145)
Total Bilirubin: 0.7 mg/dL (ref 0.3–1.2)
Total Protein: 6.4 g/dL — ABNORMAL LOW (ref 6.5–8.1)

## 2021-12-09 LAB — LACTATE DEHYDROGENASE: LDH: 134 U/L (ref 98–192)

## 2021-12-09 MED ORDER — SODIUM CHLORIDE 0.9 % IV SOLN: Freq: Once | INTRAVENOUS | Status: AC

## 2021-12-09 MED ORDER — DIPHENHYDRAMINE HCL 25 MG PO CAPS
50.0000 mg | ORAL_CAPSULE | Freq: Once | ORAL | Status: AC
  Administered 2021-12-09: 50 mg via ORAL
  Filled 2021-12-09: qty 2

## 2021-12-09 MED ORDER — DEXAMETHASONE 4 MG PO TABS: 20.0000 mg | ORAL_TABLET | ORAL | 3 refills | Status: DC

## 2021-12-09 MED ORDER — ACETAMINOPHEN 325 MG PO TABS
650.0000 mg | ORAL_TABLET | Freq: Once | ORAL | Status: AC
  Administered 2021-12-09: 650 mg via ORAL
  Filled 2021-12-09: qty 2

## 2021-12-09 MED ORDER — ZOLEDRONIC ACID 4 MG/100ML IV SOLN
4.0000 mg | Freq: Once | INTRAVENOUS | Status: AC
  Administered 2021-12-09: 4 mg via INTRAVENOUS
  Filled 2021-12-09: qty 100

## 2021-12-09 MED ORDER — DARATUMUMAB-HYALURONIDASE-FIHJ 1800-30000 MG-UT/15ML ~~LOC~~ SOLN
1800.0000 mg | Freq: Once | SUBCUTANEOUS | Status: AC
  Administered 2021-12-09: 1800 mg via SUBCUTANEOUS
  Filled 2021-12-09: qty 15

## 2021-12-09 NOTE — Patient Instructions (Signed)
[place]   ?Discharge Instructions: ?Thank you for choosing [facility] to provide your oncology and hematology care.  ? ?If you have a lab appointment with the [facility], please go directly to the [facility] and check in at the registration area. ?  ?Wear comfortable clothing and clothing appropriate for easy access to any Portacath or PICC line.  ? ?We strive to give you quality time with your provider. You may need to reschedule your appointment if you arrive late (15 or more minutes).  Arriving late affects you and other patients whose appointments are after yours.  Also, if you miss three or more appointments without notifying the office, you may be dismissed from the clinic at the provider?s discretion.    ?  ?For prescription refill requests, have your pharmacy contact our office and allow 72 hours for refills to be completed.   ? ?Today you received the following chemotherapy and/or immunotherapy agents: Daratumumab Hyaluronidase (Darzalex Faspro)     ?  ?To help prevent nausea and vomiting after your treatment, we encourage you to take your nausea medication as directed. ? ?BELOW ARE SYMPTOMS THAT SHOULD BE REPORTED IMMEDIATELY: ?*FEVER GREATER THAN 100.4 F (38 ?C) OR HIGHER ?*CHILLS OR SWEATING ?*NAUSEA AND VOMITING THAT IS NOT CONTROLLED WITH YOUR NAUSEA MEDICATION ?*UNUSUAL SHORTNESS OF BREATH ?*UNUSUAL BRUISING OR BLEEDING ?*URINARY PROBLEMS (pain or burning when urinating, or frequent urination) ?*BOWEL PROBLEMS (unusual diarrhea, constipation, pain near the anus) ?TENDERNESS IN MOUTH AND THROAT WITH OR WITHOUT PRESENCE OF ULCERS (sore throat, sores in mouth, or a toothache) ?UNUSUAL RASH, SWELLING OR PAIN  ?UNUSUAL VAGINAL DISCHARGE OR ITCHING  ? ?Items with * indicate a potential emergency and should be followed up as soon as possible or go to the Emergency Department if any problems should occur. ? ?Please show the CHEMOTHERAPY ALERT CARD or  IMMUNOTHERAPY ALERT CARD at check-in to the Emergency Department and triage nurse. ? ?Should you have questions after your visit or need to cancel or reschedule your appointment, please contact [facility]  Dept: [phone]  and follow the prompts.  Office hours are 8:00 a.m. to 4:30 p.m. Monday - Friday. Please note that voicemails left after 4:00 p.m. may not be returned until the following business day.  We are closed weekends and major holidays. You have access to a nurse at all times for urgent questions. Please call the main number to the clinic Dept: [phone] and follow the prompts. ? ? ?For any non-urgent questions, you may also contact your provider using MyChart. We now offer e-Visits for anyone 80 and older to request care online for non-urgent symptoms. For details visit [email or web address]. ?  ?Also download the MyChart app! Go to the app store, search "MyChart", open the app, select [facility], and log in with your MyChart username and password. ? ?Due to Covid, a mask is required upon entering the hospital/clinic. If you do not have a mask, one will be given to you upon arrival. For doctor visits, patients may have 1 support person aged 80 or older with them. For treatment visits, patients cannot have anyone with them due to current Covid guidelines and our immunocompromised population.  ? ?

## 2021-12-09 NOTE — Progress Notes (Signed)
?[place] ?Telephone:(336) [phone]   [phone] ? ?PROGRESS NOTE ? ?Patient Care Team: ?Marin Olp, MD as PCP - General (Family Medicine) ?Jerline Pain, MD as PCP - Cardiology (Cardiology) ?[name], MD as Consulting Physician (General Surgery) ? ?Hematological/Oncological History ?# IgG Kappa Multiple Myeloma ?07/22/2021: noted to have elevated serum protein and Hgb 12.3 (mild anemia). Further workup showed SPEP M-Spike 4.2 g/dL with IgG-Kappa as monoclonal protein, IgG 5908 mg/dL ?09/08/2021: Initial evaluation at [facility]. bone marrow biopsy performed, showed 60% abnormal plasma cells in 60% cellular marrow. Labs showed kappa 35.28 mg/dL, lambda 0.77 mg/dL, K/L ratio 45.82 and M-Spike 4.48 g/dL, IgG kappa monoclonal gammopathy ?09/15/2021: CT skeletal survey showed no CT evidence of aggressive osseous lesions.  ?09/26/2021: transfer care to Dr. Lorenso Courier at [facility].  ?09/30/2021: Cycle 1 Day 1 of Dara/Dex ?10/06/2021: Cycle 1 Day 8 (added revlimid) ?10/28/2021: Cycle 2 Day 1 of Dara/Rev/Dex ?11/25/2021: Cycle 3 Day 1 of Dara/Rev/Dex ? ?Interval History:  ?Wesley [name] 81 y.o. male with medical history significant for IgG kappa multiple myeloma who presents for a follow up visit. The patient's last visit was on 11/25/2021. In the interim since the last visit he continued treatment with Dara/Revlimid/dexamethasone. ? ?On exam today Wesley [name] is accompanied by his wife.  He reports he is doing well and his energy levels continue to improve.  He is able to be more active and complete his daily activities on his own.  He has a great appetite and denies any weight loss.  He denies nausea, vomiting or abdominal pain.  His headaches have improved over the last 2 weeks.  He denies any bowel habit changes including recurrent episodes of diarrhea or constipation.  He denies easy bruising or signs of active bleeding.  He reports improvement of neuropathy  in his fingertips and stable neuropathy in his feet.  He denies any interference of grip or balance.  He denies fevers, chills, night sweats, shortness of breath, chest pain or cough. He has no other complaints. Full 10 point ROS is listed below. ? ?MEDICAL HISTORY:  ?Past Medical History:  ?Diagnosis Date  ? 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 CAD (coronary artery disease)   ? Cholelithiasis   ? Chronic allergic rhinitis   ? Coronary atherosclerosis of native coronary artery   ? Fatty liver   ? GERD (gastroesophageal reflux disease)   ? HX 26yr ago, no longer a problem, no meds  ? High blood pressure   ? Hypercholesterolemia   ? 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 Multiple myeloma not having achieved remission ([facility] 09/26/2021  ? OSA on CPAP   ? Paraesophageal hernia   ? Partial bowel obstruction ([facility]   ? 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 Umbilical hernia without obstruction or gangrene   ? ? ?SURGICAL HISTORY: ?Past Surgical History:  ?Procedure Laterality Date  ? BIOPSY  10/29/2019  ? Procedure: BIOPSY;  Surgeon: PJerene Bears MD;  [facility];  Service: Gastroenterology;;  ? CARDIAC CATHETERIZATION  2000  ? Cath to rule out cardiac problems RT HTN. PT denies significant findings  ? CARDIAC CATHETERIZATION  2008  ? COLOSTOMY N/A 10/31/2019  ? Procedure: COLOSTOMY;  Surgeon: CErroll Luna MD;  [facility];  Service: General;  Laterality: N/A;  ? CYSTOSCOPY W/ URETERAL STENT PLACEMENT Bilateral 10/31/2019  ? Procedure: CYSTOSCOPY URETERAL STENT PLACEMENT BILATERAL;  Surgeon: DDiona Fanti  Annie Main, MD;  [facility];  Service: Urology;  Laterality: Bilateral;  ? CYSTOSCOPY WITH STENT PLACEMENT Bilateral 03/11/2020  ? Procedure: CYSTOSCOPY WITH BILATERAL FIREFLY INJECTION;  Surgeon: Irine Seal, MD;  [facility];  Service: Urology;  Laterality: Bilateral;  ? EYE SURGERY    ? BILATERAL CATARACT SURGERY WITH LENS IMPLANTS  ? FLEXIBLE  SIGMOIDOSCOPY N/A 10/29/2019  ? Procedure: FLEXIBLE SIGMOIDOSCOPY;  Surgeon: Jerene Bears, MD;  [facility];  Service: Gastroenterology;  Laterality: N/A;  ? INCISIONAL HERNIA REPAIR N/A 10/13/2020  ? Procedure: OPEN INCISIONAL HERNIA REPAIR WITH MESH;  Surgeon: [name], MD;  Location: [facility];  Service: General;  Laterality: N/A;  ? INSERTION OF MESH N/A 09/08/2016  ? Procedure: INSERTION OF MESH;  Surgeon: Jackolyn Confer, MD;  [facility];  Service: General;  Laterality: N/A;  ? IR RADIOLOGIST EVAL & MGMT  11/10/2020  ? IR RADIOLOGIST EVAL & MGMT  11/25/2020  ? LAPAROSCOPIC SIGMOID COLECTOMY N/A 10/31/2019  ? Procedure: DIAGNOSTIC LAPAROSCOPY; EXPLORATORY LAPAROTOMY; SIGMOID COLECTOMY;  Surgeon: Erroll Luna, MD;  [facility];  Service: General;  Laterality: N/A;  ? right rotator cuff    ? SUBMUCOSAL TATTOO INJECTION  10/29/2019  ? Procedure: SUBMUCOSAL TATTOO INJECTION;  Surgeon: Jerene Bears, MD;  [facility];  Service: Gastroenterology;;  ? TONSILLECTOMY    ? UMBILICAL HERNIA REPAIR N/A 09/08/2016  ? Procedure: OPEN UMBILICAL HERNIA REPAIR WITH MESH;  Surgeon: Jackolyn Confer, MD;  [facility];  Service: General;  Laterality: N/A;  ? VENTRAL HERNIA REPAIR N/A 02/03/2021  ? Procedure: LAPAROSCOPIC VENTRAL HERNIA REPAIR WITH MESH;  Surgeon: [name], MD;  Location: [facility];  Service: General;  Laterality: N/A;  ? XI ROBOTIC ASSISTED COLOSTOMY TAKEDOWN N/A 03/11/2020  ? Procedure: ROBOTIC ASSISTED COLOSTOMY REVERSAL, RIGID PROCTOSCOPY;  Surgeon: Leighton Ruff, MD;  [facility];  Service: General;  Laterality: N/A;  ? ? ?SOCIAL HISTORY: ?Social History  ? ?Socioeconomic History  ? Marital status: Married  ?  Spouse name: Wesley [name]   ? Number of children: 2  ? Years of education: 97  ? Highest education level: Master's degree (e.g., MA, MS, MEng, MEd, MSW, MBA)  ?Occupational History  ? Occupation: Retired   ?Tobacco Use  ? Smoking status: Never  ? Smokeless  tobacco: Never  ?Vaping Use  ? Vaping Use: Never used  ?Substance and Sexual Activity  ? Alcohol use: Yes  ?  Alcohol/week: 17.0 standard drinks  ?  Types: 3 Glasses of wine, 14 Standard drinks or equivalent per week  ? Drug use: No  ? Sexual activity: Not Currently  ?Other Topics Concern  ? Not on file  ?Social History Narrative  ? Married. 2 children 52 in 91 in 2022- both went to [employer]. 5 grandkids- all girls from oldest 15 looking premed [employer], 68 soph , 88 35 year olds, 72 year olds.   ?   ? Retired Automotive engineer- Estate manager/land agent  ? 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 Undergrad and grad school at [employer]  ?   ? Hobbies: fishing, [place] football and basketball, [place], yardwork  ? ?Social Determinants of Health  ? ?Financial Resource Strain: Low Risk   ? Difficulty of Paying Living Expenses: Not hard at all  ?Food Insecurity: No Food Insecurity  ? Worried About Charity fundraiser in the Last Year: Never true  ? Ran Out of Food in the Last Year:  Never true  ?Transportation Needs: No Transportation Needs  ? Lack of Transportation (Medical): No  ? Lack of Transportation (Non-Medical): No  ?Physical Activity: Inactive  ? Days of Exercise per Week: 0 days  ? Minutes of Exercise per Session: 0 min  ?Stress: No Stress Concern Present  ? Feeling of Stress : Not at all  ?Social Connections: Socially Integrated  ? Frequency of Communication with Friends and Family: More than three times a week  ? Frequency of Social Gatherings with Friends and Family: Once a week  ? Attends Religious Services: More than 4 times per year  ? Active Member of Clubs or Organizations: Yes  ? Attends Archivist Meetings: More than 4 times per year  ? Marital Status: Married  ?Intimate Partner Violence: Not At Risk  ? Fear of Current or Ex-Partner: No  ? Emotionally Abused: No  ? Physically Abused: No  ? Sexually Abused: No  ? ? ?FAMILY HISTORY: ?Family History  ?Problem Relation  Age of Onset  ? Diabetes Mother   ? Heart failure Mother   ?     around 59  ? CAD Father   ?     72  ? Melanoma Brother 93  ? Healthy Daughter   ? Healthy Son   ? Colon cancer Neg Hx   ? Esophageal cancer Neg Hx

## 2021-12-09 NOTE — Progress Notes (Signed)
Patient states that he took his decadron prior to his infusion appointment today.  ?

## 2021-12-12 LAB — KAPPA/LAMBDA LIGHT CHAINS
Kappa free light chain: 9.2 mg/L (ref 3.3–19.4)
Kappa, lambda light chain ratio: 1.74 — ABNORMAL HIGH (ref 0.26–1.65)
Lambda free light chains: 5.3 mg/L — ABNORMAL LOW (ref 5.7–26.3)

## 2021-12-14 ENCOUNTER — Telehealth: Payer: Self-pay | Admitting: *Deleted

## 2021-12-14 LAB — MULTIPLE MYELOMA PANEL, SERUM
Albumin SerPl Elph-Mcnc: 3.8 g/dL (ref 2.9–4.4)
Albumin/Glob SerPl: 1.6 (ref 0.7–1.7)
Alpha 1: 0.2 g/dL (ref 0.0–0.4)
Alpha2 Glob SerPl Elph-Mcnc: 0.8 g/dL (ref 0.4–1.0)
B-Globulin SerPl Elph-Mcnc: 0.8 g/dL (ref 0.7–1.3)
Gamma Glob SerPl Elph-Mcnc: 0.6 g/dL (ref 0.4–1.8)
Globulin, Total: 2.4 g/dL (ref 2.2–3.9)
IgA: 50 mg/dL — ABNORMAL LOW (ref 61–437)
IgG (Immunoglobin G), Serum: 661 mg/dL (ref 603–1613)
IgM (Immunoglobulin M), Srm: 21 mg/dL (ref 15–143)
M Protein SerPl Elph-Mcnc: 0.4 g/dL — ABNORMAL HIGH
Total Protein ELP: 6.2 g/dL (ref 6.0–8.5)

## 2021-12-14 NOTE — Telephone Encounter (Signed)
Received call from pt stating he has developed a lump on his mid abdomen that is increasing in size and has become red and warm to the touch, tender.  Denies fevers, chills. It is not in the areas where he gets his injections. ?Advised that we could see him in our Symptom Management Clinic tomorrow, Pt is agreeable to that. He can come at about 10:30. ? ?Anda Kraft, [place] is agreeable to seeing him. High priority scheduling message sent ?

## 2021-12-15 ENCOUNTER — Inpatient Hospital Stay: Payer: Medicare Other | Attending: Hematology and Oncology | Admitting: Physician Assistant

## 2021-12-15 ENCOUNTER — Inpatient Hospital Stay: Payer: Medicare Other

## 2021-12-15 ENCOUNTER — Other Ambulatory Visit: Payer: Self-pay

## 2021-12-15 VITALS — BP 139/80 | HR 83 | Temp 97.7°F | Resp 16 | Wt 195.5 lb

## 2021-12-15 DIAGNOSIS — C9000 Multiple myeloma not having achieved remission: Secondary | ICD-10-CM | POA: Diagnosis not present

## 2021-12-15 DIAGNOSIS — Z79899 Other long term (current) drug therapy: Secondary | ICD-10-CM | POA: Diagnosis not present

## 2021-12-15 DIAGNOSIS — D649 Anemia, unspecified: Secondary | ICD-10-CM | POA: Insufficient documentation

## 2021-12-15 DIAGNOSIS — L0291 Cutaneous abscess, unspecified: Secondary | ICD-10-CM

## 2021-12-15 DIAGNOSIS — Z5111 Encounter for antineoplastic chemotherapy: Secondary | ICD-10-CM | POA: Diagnosis not present

## 2021-12-15 LAB — LACTATE DEHYDROGENASE: LDH: 130 U/L (ref 98–192)

## 2021-12-15 LAB — CBC WITH DIFFERENTIAL (CANCER CENTER ONLY)
Abs Immature Granulocytes: 0.02 10*3/uL (ref 0.00–0.07)
Basophils Absolute: 0.1 10*3/uL (ref 0.0–0.1)
Basophils Relative: 1 %
Eosinophils Absolute: 0.3 10*3/uL (ref 0.0–0.5)
Eosinophils Relative: 7 %
HCT: 37.9 % — ABNORMAL LOW (ref 39.0–52.0)
Hemoglobin: 13.2 g/dL (ref 13.0–17.0)
Immature Granulocytes: 0 %
Lymphocytes Relative: 19 %
Lymphs Abs: 1 10*3/uL (ref 0.7–4.0)
MCH: 35.2 pg — ABNORMAL HIGH (ref 26.0–34.0)
MCHC: 34.8 g/dL (ref 30.0–36.0)
MCV: 101.1 fL — ABNORMAL HIGH (ref 80.0–100.0)
Monocytes Absolute: 1.2 10*3/uL — ABNORMAL HIGH (ref 0.1–1.0)
Monocytes Relative: 23 %
Neutro Abs: 2.5 10*3/uL (ref 1.7–7.7)
Neutrophils Relative %: 50 %
Platelet Count: 167 10*3/uL (ref 150–400)
RBC: 3.75 MIL/uL — ABNORMAL LOW (ref 4.22–5.81)
RDW: 15.1 % (ref 11.5–15.5)
WBC Count: 5 10*3/uL (ref 4.0–10.5)
nRBC: 0 % (ref 0.0–0.2)

## 2021-12-15 LAB — CMP (CANCER CENTER ONLY)
ALT: 24 U/L (ref 0–44)
AST: 16 U/L (ref 15–41)
Albumin: 3.8 g/dL (ref 3.5–5.0)
Alkaline Phosphatase: 94 U/L (ref 38–126)
Anion gap: 7 (ref 5–15)
BUN: 15 mg/dL (ref 8–23)
CO2: 26 mmol/L (ref 22–32)
Calcium: 8.3 mg/dL — ABNORMAL LOW (ref 8.9–10.3)
Chloride: 104 mmol/L (ref 98–111)
Creatinine: 0.91 mg/dL (ref 0.61–1.24)
GFR, Estimated: >60 mL/min (ref >60)
Glucose, Bld: 130 mg/dL — ABNORMAL HIGH (ref 70–99)
Potassium: 3.8 mmol/L (ref 3.5–5.1)
Sodium: 137 mmol/L (ref 135–145)
Total Bilirubin: 0.8 mg/dL (ref 0.3–1.2)
Total Protein: 6.3 g/dL — ABNORMAL LOW (ref 6.5–8.1)

## 2021-12-15 MED ORDER — LIDOCAINE HCL 1 % IJ SOLN
5.0000 mL | Freq: Once | INTRAMUSCULAR | Status: AC
  Administered 2021-12-15: 5 mL via INTRADERMAL
  Filled 2021-12-15: qty 20

## 2021-12-15 MED ORDER — DOXYCYCLINE HYCLATE 100 MG PO TABS: 100.0000 mg | ORAL_TABLET | Freq: Two times a day (BID) | ORAL | 0 refills | Status: AC

## 2021-12-15 NOTE — Progress Notes (Signed)
? ? ? ?Symptom Management Consult note ?[facility]   ? ?Patient Care Team: ?Marin Olp, MD as PCP - General (Family Medicine) ?Jerline Pain, MD as PCP - Cardiology (Cardiology) ?[name], MD as Consulting Physician (General Surgery)  ? ? ?Name of the patient: Wesley [name]  [id number]  04-11-1941  ? ?Date of visit: 12/15/2021  ? ? ?Chief complaint/ Reason for visit- abscess ? ?Oncology History  ?Multiple myeloma not having achieved remission ([facility])  ?09/26/2021 Initial Diagnosis  ? Multiple myeloma not having achieved remission ([place]) ? ?  ?09/30/2021 -  Chemotherapy  ? Patient is on Treatment Plan : MYELOMA NEWLY DIAGNOSED Daratumumab + Lenalidomide + Dexamethasone Weekly (DaraRd) q28d  ? ?  ?  ? ? ?Current Therapy: Dara/Revlimid/dexamethasone ? ?Interval history- Wesley [name] is an 81 yo male presenting to [facility] today with chief complaint of abscess on abdomen x 5 days. It has slowly been growing in size. He denies any pain. He does not remember any injury or wound to that area. He denies any fever, chills, abdominal pain, nausea, vomiting, back pain. No OTC medications tried prior to arrival. He reports to feeling well overall.  ? ? ? ?ROS  ?All other systems are reviewed and are negative for acute change except as noted in the HPI. ? ? ? ?Allergies  ?Allergen Reactions  ? Demerol Other (See Comments)  ?  Causes dizziness  ? Promethazine Hcl Other (See Comments)  ?  Dizziness ?  ? Ativan [Lorazepam]   ?  Incontinence per pt  ? ? ? ?Past Medical History:  ?Diagnosis Date  ? 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 CAD (coronary artery disease)   ? Cholelithiasis   ? Chronic allergic rhinitis   ? Coronary atherosclerosis of native coronary artery   ? Fatty liver   ? GERD (gastroesophageal reflux disease)   ? HX 44yr ago, no longer a problem, no meds  ? High blood pressure   ? Hypercholesterolemia   ? 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 Multiple  myeloma not having achieved remission ([place] 09/26/2021  ? OSA on CPAP   ? Paraesophageal hernia   ? Partial bowel obstruction ([place]   ? 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 Umbilical hernia without obstruction or gangrene   ? ? ? ?Past Surgical History:  ?Procedure Laterality Date  ? BIOPSY  10/29/2019  ? Procedure: BIOPSY;  Surgeon: PJerene Bears MD;  [facility];  Service: Gastroenterology;;  ? CARDIAC CATHETERIZATION  2000  ? Cath to rule out cardiac problems RT HTN. PT denies significant findings  ? CARDIAC CATHETERIZATION  2008  ? COLOSTOMY N/A 10/31/2019  ? Procedure: COLOSTOMY;  Surgeon: CErroll Luna MD;  [facility];  Service: General;  Laterality: N/A;  ? CYSTOSCOPY W/ URETERAL STENT PLACEMENT Bilateral 10/31/2019  ? Procedure: CYSTOSCOPY URETERAL STENT PLACEMENT BILATERAL;  Surgeon: DFranchot Gallo MD;  [facility];  Service: Urology;  Laterality: Bilateral;  ? CYSTOSCOPY WITH STENT PLACEMENT Bilateral 03/11/2020  ? Procedure: CYSTOSCOPY WITH BILATERAL FIREFLY INJECTION;  Surgeon: WIrine Seal MD;  [facility];  Service: Urology;  Laterality: Bilateral;  ? EYE SURGERY    ? BILATERAL CATARACT SURGERY WITH LENS IMPLANTS  ? FLEXIBLE SIGMOIDOSCOPY N/A 10/29/2019  ? Procedure: FLEXIBLE SIGMOIDOSCOPY;  Surgeon: PJerene Bears MD;  [facility];  Service: Gastroenterology;  Laterality: N/A;  ? INCISIONAL HERNIA REPAIR N/A 10/13/2020  ?  Procedure: OPEN INCISIONAL HERNIA REPAIR WITH MESH;  Surgeon: [name], MD;  Location: [facility];  Service: General;  Laterality: N/A;  ? INSERTION OF MESH N/A 09/08/2016  ? Procedure: INSERTION OF MESH;  Surgeon: Jackolyn Confer, MD;  [facility];  Service: General;  Laterality: N/A;  ? IR RADIOLOGIST EVAL & MGMT  11/10/2020  ? IR RADIOLOGIST EVAL & MGMT  11/25/2020  ? LAPAROSCOPIC SIGMOID COLECTOMY N/A 10/31/2019  ? Procedure: DIAGNOSTIC LAPAROSCOPY; EXPLORATORY LAPAROTOMY; SIGMOID COLECTOMY;  Surgeon: Erroll Luna, MD;   [facility];  Service: General;  Laterality: N/A;  ? right rotator cuff    ? SUBMUCOSAL TATTOO INJECTION  10/29/2019  ? Procedure: SUBMUCOSAL TATTOO INJECTION;  Surgeon: Jerene Bears, MD;  [facility];  Service: Gastroenterology;;  ? TONSILLECTOMY    ? UMBILICAL HERNIA REPAIR N/A 09/08/2016  ? Procedure: OPEN UMBILICAL HERNIA REPAIR WITH MESH;  Surgeon: Jackolyn Confer, MD;  [facility];  Service: General;  Laterality: N/A;  ? VENTRAL HERNIA REPAIR N/A 02/03/2021  ? Procedure: LAPAROSCOPIC VENTRAL HERNIA REPAIR WITH MESH;  Surgeon: [name], MD;  Location: [facility];  Service: General;  Laterality: N/A;  ? XI ROBOTIC ASSISTED COLOSTOMY TAKEDOWN N/A 03/11/2020  ? Procedure: ROBOTIC ASSISTED COLOSTOMY REVERSAL, RIGID PROCTOSCOPY;  Surgeon: Leighton Ruff, MD;  [facility];  Service: General;  Laterality: N/A;  ? ? ?Social History  ? ?Socioeconomic History  ? Marital status: Married  ?  Spouse name: Yohan Samons   ? Number of children: 2  ? Years of education: 24  ? Highest education level: Master's degree (e.g., MA, MS, MEng, MEd, MSW, MBA)  ?Occupational History  ? Occupation: Retired   ?Tobacco Use  ? Smoking status: Never  ? Smokeless tobacco: Never  ?Vaping Use  ? Vaping Use: Never used  ?Substance and Sexual Activity  ? Alcohol use: Yes  ?  Alcohol/week: 17.0 standard drinks  ?  Types: 3 Glasses of wine, 14 Standard drinks or equivalent per week  ? Drug use: No  ? Sexual activity: Not Currently  ?Other Topics Concern  ? Not on file  ?Social History Narrative  ? Married. 2 children 52 in 44 in 2022- both went to [employer] [facility] [employer]. 5 grandkids- all girls from oldest 12 looking premed [employer], 52 soph , 47 68 year olds, 18 year olds.   ?   ? Retired Automotive engineer- Estate manager/land agent  ? 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 Undergrad and grad school at [employer]  ?   ? Hobbies: fishing, [place] football and basketball, [place], yardwork  ? ?Social  Determinants of Health  ? ?Financial Resource Strain: Low Risk   ? Difficulty of Paying Living Expenses: Not hard at all  ?Food Insecurity: No Food Insecurity  ? Worried About Charity fundraiser in the Last Year: Never true  ? Ran Out of Food in the Last Year: Never true  ?Transportation Needs: No Transportation Needs  ? Lack of Transportation (Medical): No  ? Lack of Transportation (Non-Medical): No  ?Physical Activity: Inactive  ? Days of Exercise per Week: 0 days  ? Minutes of Exercise per Session: 0 min  ?Stress: No Stress Concern Present  ? Feeling of Stress : Not at all  ?Social Connections: Socially Integrated  ? Frequency of Communication with Friends and Family: More than three times a week  ? Frequency of Social Gatherings with Friends and Family: Once a week  ? Attends  Religious Services: More than 4 times per year  ? Active Member of Clubs or Organizations: Yes  ? Attends Archivist Meetings: More than 4 times per year  ? Marital Status: Married  ?Intimate Partner Violence: Not At Risk  ? Fear of Current or Ex-Partner: No  ? Emotionally Abused: No  ? Physically Abused: No  ? Sexually Abused: No  ? ? ?Family History  ?Problem Relation Age of Onset  ? Diabetes Mother   ? Heart failure Mother   ?     around 50  ? CAD Father   ?     92  ? Melanoma Brother 74  ? Healthy Daughter   ? Healthy Son   ? Colon cancer Neg Hx   ? Esophageal cancer Neg Hx   ? Rectal cancer Neg Hx   ? Stomach cancer Neg Hx   ? ? ? ?Current Outpatient Medications:  ?  doxycycline (VIBRA-TABS) 100 MG tablet, Take 1 tablet (100 mg total) by mouth 2 (two) times daily for 7 days., Disp: 14 tablet, Rfl: 0 ?  acetaminophen (TYLENOL) 500 MG tablet, Take 500 mg by mouth every 6 (six) hours as needed., Disp: , Rfl:  ?  acyclovir (ZOVIRAX) 400 MG tablet, Take 400 mg by mouth in the morning and at bedtime., Disp: , Rfl:  ?  allopurinol (ZYLOPRIM) 300 MG tablet, Take 1 tablet (300 mg total) by mouth daily., Disp: 60 tablet, Rfl: 1 ?   aspirin EC 81 MG tablet, Take 81 mg by mouth daily. Swallow whole., Disp: , Rfl:  ?  atorvastatin (LIPITOR) 80 MG tablet, Take 1 tablet by mouth daily., Disp: , Rfl:  ?  Cholecalciferol (VITAMIN D3) 25 MCG (1000 UT) CAPS

## 2021-12-21 ENCOUNTER — Other Ambulatory Visit: Payer: Self-pay | Admitting: Hematology and Oncology

## 2021-12-21 ENCOUNTER — Other Ambulatory Visit: Payer: Self-pay | Admitting: *Deleted

## 2021-12-21 DIAGNOSIS — C9000 Multiple myeloma not having achieved remission: Secondary | ICD-10-CM

## 2021-12-21 MED ORDER — LENALIDOMIDE 25 MG PO CAPS: 25.0000 mg | ORAL_CAPSULE | Freq: Every day | ORAL | 0 refills | Status: DC

## 2021-12-22 ENCOUNTER — Telehealth: Payer: Self-pay

## 2021-12-22 ENCOUNTER — Encounter: Payer: Self-pay | Admitting: Hematology and Oncology

## 2021-12-22 MED ORDER — LENALIDOMIDE 25 MG PO CAPS: 25.0000 mg | ORAL_CAPSULE | Freq: Every day | ORAL | 0 refills | Status: DC

## 2021-12-22 NOTE — Telephone Encounter (Signed)
Called pt in response to my-chart message. Pt's Revlimid authorization obtained on 12/21/21. ?

## 2021-12-23 ENCOUNTER — Other Ambulatory Visit: Payer: Self-pay

## 2021-12-23 ENCOUNTER — Inpatient Hospital Stay: Payer: Medicare Other

## 2021-12-23 ENCOUNTER — Inpatient Hospital Stay (HOSPITAL_BASED_OUTPATIENT_CLINIC_OR_DEPARTMENT_OTHER): Payer: Medicare Other | Admitting: Hematology and Oncology

## 2021-12-23 VITALS — BP 124/72 | HR 70 | Temp 97.8°F | Resp 17 | Wt 196.2 lb

## 2021-12-23 DIAGNOSIS — D539 Nutritional anemia, unspecified: Secondary | ICD-10-CM

## 2021-12-23 DIAGNOSIS — Z5111 Encounter for antineoplastic chemotherapy: Secondary | ICD-10-CM | POA: Diagnosis not present

## 2021-12-23 DIAGNOSIS — L0291 Cutaneous abscess, unspecified: Secondary | ICD-10-CM

## 2021-12-23 DIAGNOSIS — C9000 Multiple myeloma not having achieved remission: Secondary | ICD-10-CM

## 2021-12-23 DIAGNOSIS — D649 Anemia, unspecified: Secondary | ICD-10-CM | POA: Diagnosis not present

## 2021-12-23 DIAGNOSIS — Z79899 Other long term (current) drug therapy: Secondary | ICD-10-CM | POA: Diagnosis not present

## 2021-12-23 LAB — CBC WITH DIFFERENTIAL (CANCER CENTER ONLY)
Abs Immature Granulocytes: 0.03 10*3/uL (ref 0.00–0.07)
Basophils Absolute: 0.1 10*3/uL (ref 0.0–0.1)
Basophils Relative: 2 %
Eosinophils Absolute: 0.5 10*3/uL (ref 0.0–0.5)
Eosinophils Relative: 12 %
HCT: 37.4 % — ABNORMAL LOW (ref 39.0–52.0)
Hemoglobin: 12.9 g/dL — ABNORMAL LOW (ref 13.0–17.0)
Immature Granulocytes: 1 %
Lymphocytes Relative: 16 %
Lymphs Abs: 0.7 10*3/uL (ref 0.7–4.0)
MCH: 34.9 pg — ABNORMAL HIGH (ref 26.0–34.0)
MCHC: 34.5 g/dL (ref 30.0–36.0)
MCV: 101.1 fL — ABNORMAL HIGH (ref 80.0–100.0)
Monocytes Absolute: 0.4 10*3/uL (ref 0.1–1.0)
Monocytes Relative: 8 %
Neutro Abs: 2.7 10*3/uL (ref 1.7–7.7)
Neutrophils Relative %: 61 %
Platelet Count: 250 10*3/uL (ref 150–400)
RBC: 3.7 MIL/uL — ABNORMAL LOW (ref 4.22–5.81)
RDW: 15.1 % (ref 11.5–15.5)
WBC Count: 4.4 10*3/uL (ref 4.0–10.5)
nRBC: 0 % (ref 0.0–0.2)

## 2021-12-23 LAB — CMP (CANCER CENTER ONLY)
ALT: 30 U/L (ref 0–44)
AST: 20 U/L (ref 15–41)
Albumin: 3.8 g/dL (ref 3.5–5.0)
Alkaline Phosphatase: 104 U/L (ref 38–126)
Anion gap: 6 (ref 5–15)
BUN: 15 mg/dL (ref 8–23)
CO2: 26 mmol/L (ref 22–32)
Calcium: 8.5 mg/dL — ABNORMAL LOW (ref 8.9–10.3)
Chloride: 107 mmol/L (ref 98–111)
Creatinine: 0.88 mg/dL (ref 0.61–1.24)
GFR, Estimated: >60 mL/min (ref >60)
Glucose, Bld: 141 mg/dL — ABNORMAL HIGH (ref 70–99)
Potassium: 4.1 mmol/L (ref 3.5–5.1)
Sodium: 139 mmol/L (ref 135–145)
Total Bilirubin: 0.9 mg/dL (ref 0.3–1.2)
Total Protein: 6.1 g/dL — ABNORMAL LOW (ref 6.5–8.1)

## 2021-12-23 LAB — LACTATE DEHYDROGENASE: LDH: 135 U/L (ref 98–192)

## 2021-12-23 MED ORDER — DARATUMUMAB-HYALURONIDASE-FIHJ 1800-30000 MG-UT/15ML ~~LOC~~ SOLN
1800.0000 mg | Freq: Once | SUBCUTANEOUS | Status: AC
  Administered 2021-12-23: 1800 mg via SUBCUTANEOUS
  Filled 2021-12-23: qty 15

## 2021-12-23 MED ORDER — ACETAMINOPHEN 325 MG PO TABS
650.0000 mg | ORAL_TABLET | Freq: Once | ORAL | Status: AC
  Administered 2021-12-23: 650 mg via ORAL
  Filled 2021-12-23: qty 2

## 2021-12-23 MED ORDER — DIPHENHYDRAMINE HCL 25 MG PO CAPS
50.0000 mg | ORAL_CAPSULE | Freq: Once | ORAL | Status: AC
  Administered 2021-12-23: 50 mg via ORAL
  Filled 2021-12-23: qty 2

## 2021-12-23 NOTE — Progress Notes (Signed)
Pt states he took his decadron prior to coming to his treatment today around 7 AM ?

## 2021-12-23 NOTE — Patient Instructions (Signed)
[facility]   ?Discharge Instructions: ?Thank you for choosing [facility] to provide your oncology and hematology care.  ? ?If you have a lab appointment with the [facility], please go directly to the [facility] and check in at the registration area. ?  ?Wear comfortable clothing and clothing appropriate for easy access to any Portacath or PICC line.  ? ?We strive to give you quality time with your provider. You may need to reschedule your appointment if you arrive late (15 or more minutes).  Arriving late affects you and other patients whose appointments are after yours.  Also, if you miss three or more appointments without notifying the office, you may be dismissed from the clinic at the provider?s discretion.    ?  ?For prescription refill requests, have your pharmacy contact our office and allow 72 hours for refills to be completed.   ? ?Today you received the following chemotherapy and/or immunotherapy agents: Daratumumab Hyaluronidase (Darzalex Faspro)     ?  ?To help prevent nausea and vomiting after your treatment, we encourage you to take your nausea medication as directed. ? ?BELOW ARE SYMPTOMS THAT SHOULD BE REPORTED IMMEDIATELY: ?*FEVER GREATER THAN 100.4 F (38 ?C) OR HIGHER ?*CHILLS OR SWEATING ?*NAUSEA AND VOMITING THAT IS NOT CONTROLLED WITH YOUR NAUSEA MEDICATION ?*UNUSUAL SHORTNESS OF BREATH ?*UNUSUAL BRUISING OR BLEEDING ?*URINARY PROBLEMS (pain or burning when urinating, or frequent urination) ?*BOWEL PROBLEMS (unusual diarrhea, constipation, pain near the anus) ?TENDERNESS IN MOUTH AND THROAT WITH OR WITHOUT PRESENCE OF ULCERS (sore throat, sores in mouth, or a toothache) ?UNUSUAL RASH, SWELLING OR PAIN  ?UNUSUAL VAGINAL DISCHARGE OR ITCHING  ? ?Items with * indicate a potential emergency and should be followed up as soon as possible or go to the Emergency Department if any problems should occur. ? ?Please show the CHEMOTHERAPY ALERT CARD or  IMMUNOTHERAPY ALERT CARD at check-in to the Emergency Department and triage nurse. ? ?Should you have questions after your visit or need to cancel or reschedule your appointment, please contact [facility]  Dept: [phone]  and follow the prompts.  Office hours are 8:00 a.m. to 4:30 p.m. Monday - Friday. Please note that voicemails left after 4:00 p.m. may not be returned until the following business day.  We are closed weekends and major holidays. You have access to a nurse at all times for urgent questions. Please call the main number to the clinic Dept: [phone] and follow the prompts. ? ? ?For any non-urgent questions, you may also contact your provider using MyChart. We now offer e-Visits for anyone 40 and older to request care online for non-urgent symptoms. For details visit [email or web address]. ?  ?Also download the MyChart app! Go to the app store, search "MyChart", open the app, select [facility], and log in with your MyChart username and password. ? ?Due to Covid, a mask is required upon entering the hospital/clinic. If you do not have a mask, one will be given to you upon arrival. For doctor visits, patients may have 1 support Witten Certain aged 15 or older with them. For treatment visits, patients cannot have anyone with them due to current Covid guidelines and our immunocompromised population.  ? ?

## 2022-01-01 ENCOUNTER — Encounter: Payer: Self-pay | Admitting: Hematology and Oncology

## 2022-01-01 NOTE — Progress Notes (Signed)
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 Further workup showed SPEP M-Spike 4.2 g/dL with IgG-Kappa as monoclonal protein, IgG 5908 mg/dL 09/08/2021: Initial evaluation at [facility]. bone marrow biopsy performed, showed 60% abnormal plasma cells in 60% cellular marrow. 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  09/26/2021: transfer care to Dr. Lorenso Courier at [facility].  09/30/2021: Cycle 1 Day 1 of Dara/Dex 10/06/2021: Cycle 1 Day 8 (added revlimid) 10/28/2021: Cycle 2 Day 1 of Dara/Rev/Dex 11/25/2021: Cycle 3 Day 1 of Dara/Rev/Dex  Interval History:  Wesley [name] 81 y.o. male with medical history significant for IgG kappa multiple myeloma who presents for a follow up visit. The patient's last visit was on 12/09/2021. In the interim since the last visit he continued treatment with Dara/Revlimid/dexamethasone.  On exam today Wesley [name] is accompanied by his wife.  He reports he did develop a small wound on his chest after the drainage of a boil in our symptom management clinic.  He is currently washing with soap and water and was also given a 7-day supply of doxycycline.  He reports his energy is about a 6 out of 10 right now.  He notes his appetite is been good and he has been eating well.  He is not noticing any major side effects as a result of his treatment.  Overall he feels "great today".  He denies fevers, chills, night sweats,  shortness of breath, chest pain or cough. He has no other complaints. Full 10 point ROS is listed below.  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)    CAD (coronary artery disease)    Cholelithiasis    Chronic allergic rhinitis    Coronary atherosclerosis of native coronary artery    Fatty liver    GERD (gastroesophageal reflux disease)    HX 13yr ago, no longer a problem, no meds   High blood pressure    Hypercholesterolemia    Macrocytosis without anemia    Multiple myeloma not having achieved remission ([place] 09/26/2021   OSA on CPAP    Paraesophageal hernia    Partial bowel obstruction (HCC)    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 PT denies significant findings   CARDIAC CATHETERIZATION  2008   COLOSTOMY N/A 10/31/2019   Procedure: COLOSTOMY;  Surgeon: CErroll Luna MD;  Location: [facility];  Service: General;  Laterality: N/A;   CYSTOSCOPY W/ URETERAL STENT PLACEMENT Bilateral 10/31/2019   Procedure: CYSTOSCOPY URETERAL STENT PLACEMENT BILATERAL;  Surgeon: DFranchot Gallo MD;  Location: [facility];  Service: Urology;  Laterality: Bilateral;   CYSTOSCOPY WITH STENT  PLACEMENT Bilateral 03/11/2020   Procedure: CYSTOSCOPY WITH BILATERAL FIREFLY INJECTION;  Surgeon: Irine Seal, MD;  Location: [facility];  Service: Urology;  Laterality: Bilateral;   EYE SURGERY     BILATERAL CATARACT SURGERY WITH LENS IMPLANTS   FLEXIBLE SIGMOIDOSCOPY N/A 10/29/2019   Procedure: FLEXIBLE SIGMOIDOSCOPY;  Surgeon: Jerene Bears, MD;  Location: [facility];  Service: Gastroenterology;   Laterality: N/A;   INCISIONAL HERNIA REPAIR N/A 10/13/2020   Procedure: OPEN INCISIONAL HERNIA REPAIR WITH MESH;  Surgeon: [name], MD;  Location: [place];  Service: General;  Laterality: N/A;   INSERTION OF MESH N/A 09/08/2016   Procedure: INSERTION OF MESH;  Surgeon: Jackolyn Confer, MD;  Location: [facility];  Service: General;  Laterality: N/A;   IR RADIOLOGIST EVAL & MGMT  11/10/2020   IR RADIOLOGIST EVAL & MGMT  11/25/2020   LAPAROSCOPIC SIGMOID COLECTOMY N/A 10/31/2019   Procedure: DIAGNOSTIC LAPAROSCOPY; EXPLORATORY LAPAROTOMY; SIGMOID COLECTOMY;  Surgeon: Erroll Luna, MD;  Location: [facility];  Service: General;  Laterality: N/A;   right rotator cuff     SUBMUCOSAL TATTOO INJECTION  10/29/2019   Procedure: SUBMUCOSAL TATTOO INJECTION;  Surgeon: Jerene Bears, MD;  [facility];  Service: Gastroenterology;;   TONSILLECTOMY     UMBILICAL HERNIA REPAIR N/A 09/08/2016   Procedure: OPEN UMBILICAL HERNIA REPAIR WITH MESH;  Surgeon: Jackolyn Confer, MD;  [facility];  Service: General;  Laterality: N/A;   VENTRAL HERNIA REPAIR N/A 02/03/2021   Procedure: LAPAROSCOPIC VENTRAL HERNIA REPAIR WITH MESH;  Surgeon: [name], MD;  Location: [place];  Service: General;  Laterality: N/A;   XI ROBOTIC ASSISTED COLOSTOMY TAKEDOWN N/A 03/11/2020   Procedure: ROBOTIC ASSISTED COLOSTOMY REVERSAL, RIGID PROCTOSCOPY;  Surgeon: Leighton Ruff, MD;  Location: [facility];  Service: General;  Laterality: N/A;    SOCIAL HISTORY: Social History   Socioeconomic History   Marital status: Married    Spouse name: Reuel Lamadrid    Number of children: 2   Years of education: 12   Highest education level: Master's degree (e.g., MA, MS, MEng, MEd, MSW, MBA)  Occupational History   Occupation: Retired   Tobacco Use   Smoking status: Never   Smokeless tobacco: Never  Vaping Use   Vaping Use: Never used  Substance and Sexual Activity   Alcohol use: Yes    Alcohol/week: 17.0 standard drinks     Types: 3 Glasses of wine, 14 Standard drinks or equivalent per week   Drug use: No   Sexual activity: Not Currently  Other Topics Concern   Not on file  Social History Narrative   Married. 2 children 52 in 72 in 2022- both went to [employer]. 5 grandkids- all girls from oldest 57 looking premed [employer], 77 soph [place], 85 34 year olds, 21 year olds.       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 [employer]: fishing, [employer] football and basketball, [place], [place] Strain: Low Risk    Difficulty of [place]: Not hard at [place]: No Food Insecurity   Worried About Charity fundraiser in [place]: Never true   Arboriculturist in the Last Year: Never true  Transportation Needs: No Data processing manager (Medical): No  Lack of Transportation (Non-Medical): No  Physical Activity: Inactive   Days of Exercise per Week: 0 days   Minutes of Exercise per Session: 0 min  Stress: No Stress Concern Present   Feeling of Stress : Not at all  Social Connections: Socially Integrated   Frequency of Communication with Friends and Family: More than three times a week   Frequency of Social Gatherings with Friends and Family: Once a week   Attends Religious Services: More than 4 times per year   Active Member of [employer] or Organizations: Yes   Attends Music therapist: More than 4 times per year   Marital Status: Married  Human resources officer Violence: Not At Risk   Fear of Current or Ex-Partner: No   Emotionally Abused: No   Physically Abused: No   Sexually Abused: No    FAMILY HISTORY: Family History  Problem Relation Age of Onset   Diabetes Mother    Heart failure Mother        around 42   CAD Father        56   Melanoma Brother 62   Healthy Daughter     Healthy Son    Colon cancer Neg Hx    Esophageal cancer Neg Hx    Rectal cancer Neg Hx    Stomach cancer Neg Hx     ALLERGIES:  is allergic to demerol, promethazine hcl, and ativan [lorazepam].  MEDICATIONS:  Current Outpatient Medications  Medication Sig Dispense Refill   acetaminophen (TYLENOL) 500 MG tablet Take 500 mg by mouth every 6 (six) hours as needed.     acyclovir (ZOVIRAX) 400 MG tablet Take 400 mg by mouth in the morning and at bedtime.     allopurinol (ZYLOPRIM) 300 MG tablet Take 1 tablet (300 mg total) by mouth daily. 60 tablet 1   aspirin EC 81 MG tablet Take 81 mg by mouth daily. Swallow whole.     atorvastatin (LIPITOR) 80 MG tablet Take 1 tablet by mouth daily.     Cholecalciferol (VITAMIN D3) 25 MCG (1000 UT) CAPS Take 1,000 Units by mouth daily.     dexamethasone (DECADRON) 4 MG tablet Take 5 tablets (20 mg total) by mouth once a week. 20 tablet 3   fexofenadine (ALLEGRA) 180 MG tablet      finasteride (PROSCAR) 5 MG tablet Take 5 mg by mouth daily.     Ibuprofen 200 MG CAPS      lenalidomide (REVLIMID) 25 MG capsule Take 1 capsule (25 mg total) by mouth daily. Take for 21 days, none for 7 days. Repeat every 28 days. [employer] Auth # [id number] Date Obtained 12/21/21 21 capsule 0   lisinopril (ZESTRIL) 10 MG tablet Take 1 tablet (10 mg total) by mouth daily. 90 tablet 3   Magnesium Oxide 400 MG CAPS      melatonin 5 MG TABS      Omega-3 Fatty Acids (FISH OIL) 1200 MG CAPS Take 1,200 mg by mouth daily.     ondansetron (ZOFRAN) 8 MG tablet Take 1 tablet (8 mg total) by mouth every 8 (eight) hours as needed. 30 tablet 0   pantoprazole (PROTONIX) 40 MG tablet Take by mouth.     prochlorperazine (COMPAZINE) 10 MG tablet Take 1 tablet (10 mg total) by mouth every 6 (six) hours as needed for nausea or vomiting. 30 tablet 0   vitamin B-12 (CYANOCOBALAMIN) 1000 MCG tablet Take 1,000 mcg by mouth daily.     No current facility-administered medications  for this visit.     REVIEW OF SYSTEMS:   Constitutional: ( - ) fevers, ( - )  chills , ( - ) night sweats Eyes: ( - ) blurriness of vision, ( - ) double vision, ( - ) watery eyes Ears, nose, mouth, throat, and face: ( - ) mucositis, ( - ) sore throat Respiratory: ( - ) cough, ( - ) dyspnea, ( - ) wheezes Cardiovascular: ( - ) palpitation, ( - ) chest discomfort, ( - ) lower extremity swelling Gastrointestinal:  ( - ) nausea, ( - ) heartburn, ( - ) change in bowel habits Skin: ( - ) abnormal skin rashes Lymphatics: ( - ) new lymphadenopathy, ( - ) easy bruising Neurological: ( + ) numbness, ( - ) tingling, ( - ) new weaknesses Behavioral/Psych: ( - ) mood change, ( - ) new changes  All other systems were reviewed with the patient and are negative.  PHYSICAL EXAMINATION: ECOG PERFORMANCE STATUS: 1 - Symptomatic but completely ambulatory  Vitals:   12/23/21 0958  BP: 124/72  Pulse: 70  Resp: 17  Temp: 97.8 F (36.6 C)  SpO2: 96%   Filed Weights   12/23/21 0958  Weight: 196 lb 3 oz (89 kg)    GENERAL: Well-appearing elderly Caucasian male, alert, no distress and comfortable SKIN: skin color, texture, turgor are normal, no rashes or significant lesions EYES: conjunctiva are pink and non-injected, sclera clear LUNGS: clear to auscultation and percussion with normal breathing effort HEART: regular rate & rhythm and no murmurs and no lower extremity edema Musculoskeletal: no cyanosis of digits and no clubbing  PSYCH: alert & oriented x 3, fluent speech NEURO: no focal motor/sensory deficits  LABORATORY DATA:  I have reviewed the data as listed    Latest Ref Rng & Units 12/23/2021    9:36 AM 12/15/2021   10:33 AM 12/09/2021    9:25 AM  CBC  WBC 4.0 - 10.5 K/uL 4.4   5.0   5.4    Hemoglobin 13.0 - 17.0 g/dL 12.9   13.2   13.3    Hematocrit 39.0 - 52.0 % 37.4   37.9   39.0    Platelets 150 - 400 K/uL 250   167   175         Latest Ref Rng & Units 12/23/2021    9:36 AM 12/15/2021   10:33 AM  12/09/2021    9:25 AM  CMP  Glucose 70 - 99 mg/dL 141   130   134    BUN 8 - 23 mg/dL '15   15   13    ' Creatinine 0.61 - 1.24 mg/dL 0.88   0.91   0.98    Sodium 135 - 145 mmol/L 139   137   135    Potassium 3.5 - 5.1 mmol/L 4.1   3.8   4.1    Chloride 98 - 111 mmol/L 107   104   103    CO2 22 - 32 mmol/L '26   26   27    ' Calcium 8.9 - 10.3 mg/dL 8.5   8.3   9.2    Total Protein 6.5 - 8.1 g/dL 6.1   6.3   6.4    Total Bilirubin 0.3 - 1.2 mg/dL 0.9   0.8   0.7    Alkaline Phos 38 - 126 U/L 104   94   108    AST 15 - 41 U/L 20   16  19    ALT 0 - 44 U/L '30   24   27      ' Lab Results  Component Value Date   MPROTEIN 0.4 (H) 12/09/2021   MPROTEIN 0.9 (H) 11/04/2021   MPROTEIN 3.5 (H) 10/06/2021   Lab Results  Component Value Date   KPAFRELGTCHN 9.2 12/09/2021   KPAFRELGTCHN 9.2 11/04/2021   KPAFRELGTCHN 61.7 (H) 10/06/2021   LAMBDASER 5.3 (L) 12/09/2021   LAMBDASER 4.4 (L) 11/04/2021   LAMBDASER 3.8 (L) 10/06/2021   KAPLAMBRATIO 1.74 (H) 12/09/2021   KAPLAMBRATIO 2.09 (H) 11/04/2021   KAPLAMBRATIO 16.24 (H) 10/06/2021    RADIOGRAPHIC STUDIES: No results found.  ASSESSMENT & PLAN  MANNING LUNA 81 y.o. male with medical history significant for IgG kappa multiple myeloma who presents for a follow up visit.   The patient meets diagnostic criteria by having greater than 60% plasma cells in his bone marrow.  At this time we are in agreement with [facility] in [place], Revlimid, and dexamethasone as the treatment of choice.   Previously we discussed the concept of comanaged care.  Comanaged care is when the patient has a local primary provider who administers local support and therapy while expert advice and treatment recommendations are rendered by a cancer specialist at a large academic center.  In this arrangement we provide local support, labs, treatment, and emergency visits, however the major decisions regarding the course of treatment are decided by a  physician at an academic medical center.  The patient voiced his understanding of comanaged care and was agreeable to proceeding forward with this care model.   The regimen of choice for this patient will be Dara, Revlimid, and dexamethasone.  We will start with weekly treatments for the first 2 cycles.  After that for an additional 4 cycles he will be getting it every other week.  After cycle 6 the patient will then continue with monthly treatment.    # IgG Kappa Multiple Myeloma --Patient meets diagnostic criteria for multiple myeloma based on the presence of 60% plasma cells in the bone marrow --Pretreatment phenotype RBC as well as type and screen ordered  --Cycle 1 Day 1of Dara/Rev/Dex started on 09/30/2021. He started without revlimid which was added on 10/06/2021 (Cycle 1 Day 8)  -- Labs to include weekly CBC, CMP, LDH.  Additionally monthly SPEP and serum free light chains. Plan:  --Today is Cycle 4 Day 1 of Dara/Rev/Dex --Labs today were reviewed and adequate for treatment.  -- Return to clinic in 2 weeks prior to Cycle 4, Day 15 of Dara/Rev/Dex  #Macrocytic anemia: --Hgb is 12.9, MCV 101.1, improving   #Supportive Care -- chemotherapy education complete  -- port placement not required.  --ASA 81 mg p.o. daily for thromboprophylaxis on Revlimid. -- zofran 90m q8H PRN and compazine 188mPO q6H for nausea -- acyclovir 40083mO BID for VCZ prophylaxis -- allopurinol 300m71m daily for TLS prophylaxis -- zometa clearance obtained, will start today -- no pain medication required at this time.   No orders of the defined types were placed in this encounter.   All questions were answered. The patient knows to call the clinic with any problems, questions or concerns.  I have spent a total of 30 minutes minutes of face-to-face and non-face-to-face time, preparing to see the patient, performing a medically appropriate examination, counseling and educating the patient, ordering  medications/tests, documenting clinical information in the electronic health record,  and care coordination.   JohnJenny Reichmann  Lauretta Grill, MD Department of Hematology/Oncology [place] at [facility] Phone: [phone] Pager: [phone] Email: [email or web address]    01/01/2022 4:09 PM

## 2022-01-06 ENCOUNTER — Inpatient Hospital Stay: Payer: Medicare Other

## 2022-01-06 ENCOUNTER — Inpatient Hospital Stay (HOSPITAL_BASED_OUTPATIENT_CLINIC_OR_DEPARTMENT_OTHER): Payer: Medicare Other | Admitting: Hematology and Oncology

## 2022-01-06 VITALS — BP 124/63 | HR 80 | Temp 98.0°F | Resp 17 | Ht 68.0 in | Wt 199.4 lb

## 2022-01-06 DIAGNOSIS — C9000 Multiple myeloma not having achieved remission: Secondary | ICD-10-CM

## 2022-01-06 DIAGNOSIS — Z5111 Encounter for antineoplastic chemotherapy: Secondary | ICD-10-CM | POA: Diagnosis not present

## 2022-01-06 DIAGNOSIS — Z79899 Other long term (current) drug therapy: Secondary | ICD-10-CM | POA: Diagnosis not present

## 2022-01-06 DIAGNOSIS — D539 Nutritional anemia, unspecified: Secondary | ICD-10-CM

## 2022-01-06 DIAGNOSIS — D649 Anemia, unspecified: Secondary | ICD-10-CM | POA: Diagnosis not present

## 2022-01-06 LAB — CMP (CANCER CENTER ONLY)
ALT: 32 U/L (ref 0–44)
AST: 22 U/L (ref 15–41)
Albumin: 4.1 g/dL (ref 3.5–5.0)
Alkaline Phosphatase: 113 U/L (ref 38–126)
Anion gap: 8 (ref 5–15)
BUN: 15 mg/dL (ref 8–23)
CO2: 25 mmol/L (ref 22–32)
Calcium: 9.3 mg/dL (ref 8.9–10.3)
Chloride: 108 mmol/L (ref 98–111)
Creatinine: 0.87 mg/dL (ref 0.61–1.24)
GFR, Estimated: >60 mL/min (ref >60)
Glucose, Bld: 120 mg/dL — ABNORMAL HIGH (ref 70–99)
Potassium: 4.4 mmol/L (ref 3.5–5.1)
Sodium: 141 mmol/L (ref 135–145)
Total Bilirubin: 0.7 mg/dL (ref 0.3–1.2)
Total Protein: 6.5 g/dL (ref 6.5–8.1)

## 2022-01-06 LAB — CBC WITH DIFFERENTIAL (CANCER CENTER ONLY)
Abs Immature Granulocytes: 0.04 10*3/uL (ref 0.00–0.07)
Basophils Absolute: 0.2 10*3/uL — ABNORMAL HIGH (ref 0.0–0.1)
Basophils Relative: 3 %
Eosinophils Absolute: 0.3 10*3/uL (ref 0.0–0.5)
Eosinophils Relative: 5 %
HCT: 39.9 % (ref 39.0–52.0)
Hemoglobin: 13.7 g/dL (ref 13.0–17.0)
Immature Granulocytes: 1 %
Lymphocytes Relative: 11 %
Lymphs Abs: 0.7 10*3/uL (ref 0.7–4.0)
MCH: 35.1 pg — ABNORMAL HIGH (ref 26.0–34.0)
MCHC: 34.3 g/dL (ref 30.0–36.0)
MCV: 102.3 fL — ABNORMAL HIGH (ref 80.0–100.0)
Monocytes Absolute: 0.3 10*3/uL (ref 0.1–1.0)
Monocytes Relative: 5 %
Neutro Abs: 4.5 10*3/uL (ref 1.7–7.7)
Neutrophils Relative %: 75 %
Platelet Count: 184 10*3/uL (ref 150–400)
RBC: 3.9 MIL/uL — ABNORMAL LOW (ref 4.22–5.81)
RDW: 16.2 % — ABNORMAL HIGH (ref 11.5–15.5)
WBC Count: 6 10*3/uL (ref 4.0–10.5)
nRBC: 0 % (ref 0.0–0.2)

## 2022-01-06 LAB — LACTATE DEHYDROGENASE: LDH: 153 U/L (ref 98–192)

## 2022-01-06 MED ORDER — DIPHENHYDRAMINE HCL 25 MG PO CAPS
50.0000 mg | ORAL_CAPSULE | Freq: Once | ORAL | Status: AC
  Administered 2022-01-06: 50 mg via ORAL
  Filled 2022-01-06: qty 2

## 2022-01-06 MED ORDER — ACETAMINOPHEN 325 MG PO TABS
650.0000 mg | ORAL_TABLET | Freq: Once | ORAL | Status: AC
  Administered 2022-01-06: 650 mg via ORAL
  Filled 2022-01-06: qty 2

## 2022-01-06 MED ORDER — DARATUMUMAB-HYALURONIDASE-FIHJ 1800-30000 MG-UT/15ML ~~LOC~~ SOLN
1800.0000 mg | Freq: Once | SUBCUTANEOUS | Status: AC
  Administered 2022-01-06: 1800 mg via SUBCUTANEOUS
  Filled 2022-01-06: qty 15

## 2022-01-06 NOTE — Progress Notes (Signed)
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 Further workup showed SPEP M-Spike 4.2 g/dL with IgG-Kappa as monoclonal protein, IgG 5908 mg/dL 09/08/2021: Initial evaluation at [facility]. bone marrow biopsy performed, showed 60% abnormal plasma cells in 60% cellular marrow. 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  09/26/2021: transfer care to Dr. Lorenso Courier at [facility].  09/30/2021: Cycle 1 Day 1 of Dara/Dex 10/06/2021: Cycle 1 Day 8 (added revlimid) 10/28/2021: Cycle 2 Day 1 of Dara/Rev/Dex 11/25/2021: Cycle 3 Day 1 of Dara/Rev/Dex 5/12/023: Cycle 4 Day 1 of Dara/Rev/Dex  Interval History:  Wesley [name] 81 y.o. male with medical history significant for IgG kappa multiple myeloma who presents for a follow up visit. The patient's last visit was on 12/23/2021. In the interim since the last visit he continued treatment with Dara/Revlimid/dexamethasone.  On exam today Wesley [name] is accompanied by his wife.  He reports he is tolerating chemotherapy quite well.  He is not having any major side effects other than some fatigue.  He reports that the fatigue is improving however and his energy is currently a 6 out of 10.  He has been outside cutting the grass and doing day-to-day activities without difficulty.  His appetite is been good and he is having normal bowel movements.  Overall he feels well and is willing and able to proceed with  chemotherapy treatment at this time.  He denies fevers, chills, night sweats, shortness of breath, chest pain or cough. He has no other complaints. Full 10 point ROS is listed below.  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)    CAD (coronary artery disease)    Cholelithiasis    Chronic allergic rhinitis    Coronary atherosclerosis of native coronary artery    Fatty liver    GERD (gastroesophageal reflux disease)    HX 35yr ago, no longer a problem, no meds   High blood pressure    Hypercholesterolemia    Macrocytosis without anemia    Multiple myeloma not having achieved remission ([facility] 09/26/2021   OSA on CPAP    Paraesophageal hernia    Partial bowel obstruction (HCC)    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 PT denies significant findings   CARDIAC CATHETERIZATION  2008   COLOSTOMY N/A 10/31/2019   Procedure: COLOSTOMY;  Surgeon: CErroll Luna MD;  Location: [facility];  Service: General;  Laterality: N/A;   CYSTOSCOPY W/ URETERAL STENT PLACEMENT Bilateral 10/31/2019   Procedure: CYSTOSCOPY URETERAL STENT PLACEMENT BILATERAL;  Surgeon: DFranchot Gallo MD;  Location: [facility];  Service: Urology;  Laterality: Bilateral;  CYSTOSCOPY WITH STENT PLACEMENT Bilateral 03/11/2020   Procedure: CYSTOSCOPY WITH BILATERAL FIREFLY INJECTION;  Surgeon: Irine Seal, MD;  Location: [facility];  Service: Urology;  Laterality: Bilateral;   EYE SURGERY     BILATERAL CATARACT SURGERY WITH LENS IMPLANTS   FLEXIBLE SIGMOIDOSCOPY N/A 10/29/2019   Procedure: FLEXIBLE SIGMOIDOSCOPY;   Surgeon: Jerene Bears, MD;  [facility]: [facility];  Service: Gastroenterology;  Laterality: N/A;   INCISIONAL HERNIA REPAIR N/A 10/13/2020   Procedure: OPEN INCISIONAL HERNIA REPAIR WITH MESH;  Surgeon: [name], MD;  Location: [place];  Service: General;  Laterality: N/A;   INSERTION OF MESH N/A 09/08/2016   Procedure: INSERTION OF MESH;  Surgeon: Jackolyn Confer, MD;  Location: [facility];  Service: General;  Laterality: N/A;   IR RADIOLOGIST EVAL & MGMT  11/10/2020   IR RADIOLOGIST EVAL & MGMT  11/25/2020   LAPAROSCOPIC SIGMOID COLECTOMY N/A 10/31/2019   Procedure: DIAGNOSTIC LAPAROSCOPY; EXPLORATORY LAPAROTOMY; SIGMOID COLECTOMY;  Surgeon: Erroll Luna, MD;  [facility];  Service: General;  Laterality: N/A;   right rotator cuff     SUBMUCOSAL TATTOO INJECTION  10/29/2019   Procedure: SUBMUCOSAL TATTOO INJECTION;  Surgeon: Jerene Bears, MD;  [facility];  Service: Gastroenterology;;   TONSILLECTOMY     UMBILICAL HERNIA REPAIR N/A 09/08/2016   Procedure: OPEN UMBILICAL HERNIA REPAIR WITH MESH;  Surgeon: Jackolyn Confer, MD;  [facility];  Service: General;  Laterality: N/A;   VENTRAL HERNIA REPAIR N/A 02/03/2021   Procedure: LAPAROSCOPIC VENTRAL HERNIA REPAIR WITH MESH;  Surgeon: [name], MD;  Location: [place];  Service: General;  Laterality: N/A;   XI ROBOTIC ASSISTED COLOSTOMY TAKEDOWN N/A 03/11/2020   Procedure: ROBOTIC ASSISTED COLOSTOMY REVERSAL, RIGID PROCTOSCOPY;  Surgeon: Leighton Ruff, MD;  Location[facility];  Service: General;  Laterality: N/A;    SOCIAL HISTORY: Social History   Socioeconomic History   Marital status: Married    Spouse name: Milburn Freeney    Number of children: 2   Years of education: 12   Highest education level: Master's degree (e.g., MA, MS, MEng, MEd, MSW, MBA)  Occupational History   Occupation: Retired   Tobacco Use   Smoking status: Never   Smokeless tobacco: Never  Vaping Use   Vaping Use: Never used  Substance and  Sexual Activity   Alcohol use: Yes    Alcohol/week: 17.0 standard drinks    Types: 3 Glasses of wine, 14 Standard drinks or equivalent per week   Drug use: No   Sexual activity: Not Currently  Other Topics Concern   Not on file  Social History Narrative   Married. 2 children 52 in 65 in 2022- both went to [facility]. 5 grandkids- all girls from oldest 26 looking premed [employer], 41 soph [employer], 28 76 year olds, 66 year olds.       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 [employer]: fishing, [employer] football and basketball, [place], [place] Strain: Low Risk    Difficulty of [place]: Not hard at [place]: No Food Insecurity   Worried About Charity fundraiser in [place]: Never true   Arboriculturist in the Last Year: Never true  Transportation Needs: No Data processing manager (Medical):  No   Lack of Transportation (Non-Medical): No  Physical Activity: Inactive   Days of Exercise per Week: 0 days   Minutes of Exercise per Session: 0 min  Stress: No Stress Concern Present   Feeling of Stress : Not at all  Social Connections: Socially Integrated   Frequency of Communication with Friends and Family: More than three times a week   Frequency of Social Gatherings with Friends and Family: Once a week   Attends Religious Services: More than 4 times per year   Active Member of [employer] or Organizations: Yes   Attends Music therapist: More than 4 times per year   Marital Status: Married  Human resources officer Violence: Not At Risk   Fear of Current or Ex-Partner: No   Emotionally Abused: No   Physically Abused: No   Sexually Abused: No    FAMILY HISTORY: Family History  Problem Relation Age of Onset   Diabetes Mother    Heart failure Mother        around  88   CAD Father        63   Melanoma Brother 14   Healthy Daughter    Healthy Son    Colon cancer Neg Hx    Esophageal cancer Neg Hx    Rectal cancer Neg Hx    Stomach cancer Neg Hx     ALLERGIES:  is allergic to demerol, promethazine hcl, and ativan [lorazepam].  MEDICATIONS:  Current Outpatient Medications  Medication Sig Dispense Refill   acetaminophen (TYLENOL) 500 MG tablet Take 500 mg by mouth every 6 (six) hours as needed.     acyclovir (ZOVIRAX) 400 MG tablet Take 400 mg by mouth in the morning and at bedtime.     allopurinol (ZYLOPRIM) 300 MG tablet Take 1 tablet (300 mg total) by mouth daily. 60 tablet 1   aspirin EC 81 MG tablet Take 81 mg by mouth daily. Swallow whole.     atorvastatin (LIPITOR) 80 MG tablet Take 1 tablet by mouth daily.     Cholecalciferol (VITAMIN D3) 25 MCG (1000 UT) CAPS Take 1,000 Units by mouth daily.     dexamethasone (DECADRON) 4 MG tablet Take 5 tablets (20 mg total) by mouth once a week. 20 tablet 3   fexofenadine (ALLEGRA) 180 MG tablet      finasteride (PROSCAR) 5 MG tablet Take 5 mg by mouth daily.     Ibuprofen 200 MG CAPS      lenalidomide (REVLIMID) 25 MG capsule Take 1 capsule (25 mg total) by mouth daily. Take for 21 days, none for 7 days. Repeat every 28 days. [employer] Auth # [id number] Date Obtained 12/21/21 21 capsule 0   lisinopril (ZESTRIL) 10 MG tablet Take 1 tablet (10 mg total) by mouth daily. 90 tablet 3   Magnesium Oxide 400 MG CAPS      melatonin 5 MG TABS      Omega-3 Fatty Acids (FISH OIL) 1200 MG CAPS Take 1,200 mg by mouth daily.     ondansetron (ZOFRAN) 8 MG tablet Take 1 tablet (8 mg total) by mouth every 8 (eight) hours as needed. 30 tablet 0   pantoprazole (PROTONIX) 40 MG tablet Take by mouth.     prochlorperazine (COMPAZINE) 10 MG tablet Take 1 tablet (10 mg total) by mouth every 6 (six) hours as needed for nausea or vomiting. 30 tablet 0   vitamin B-12 (CYANOCOBALAMIN) 1000 MCG tablet Take 1,000 mcg by mouth daily.  No current facility-administered medications for this visit.    REVIEW OF SYSTEMS:   Constitutional: ( - ) fevers, ( - )  chills , ( - ) night sweats Eyes: ( - ) blurriness of vision, ( - ) double vision, ( - ) watery eyes Ears, nose, mouth, throat, and face: ( - ) mucositis, ( - ) sore throat Respiratory: ( - ) cough, ( - ) dyspnea, ( - ) wheezes Cardiovascular: ( - ) palpitation, ( - ) chest discomfort, ( - ) lower extremity swelling Gastrointestinal:  ( - ) nausea, ( - ) heartburn, ( - ) change in bowel habits Skin: ( - ) abnormal skin rashes Lymphatics: ( - ) new lymphadenopathy, ( - ) easy bruising Neurological: ( + ) numbness, ( - ) tingling, ( - ) new weaknesses Behavioral/Psych: ( - ) mood change, ( - ) new changes  All other systems were reviewed with the patient and are negative.  PHYSICAL EXAMINATION: ECOG PERFORMANCE STATUS: 1 - Symptomatic but completely ambulatory  Vitals:   01/06/22 1002  BP: 124/63  Pulse: 80  Resp: 17  Temp: 98 F (36.7 C)  SpO2: 96%   Filed Weights   01/06/22 1002  Weight: 199 lb 6.4 oz (90.4 kg)    GENERAL: Well-appearing elderly Caucasian male, alert, no distress and comfortable SKIN: skin color, texture, turgor are normal, no rashes or significant lesions EYES: conjunctiva are pink and non-injected, sclera clear LUNGS: clear to auscultation and percussion with normal breathing effort HEART: regular rate & rhythm and no murmurs and no lower extremity edema Musculoskeletal: no cyanosis of digits and no clubbing  PSYCH: alert & oriented x 3, fluent speech NEURO: no focal motor/sensory deficits  LABORATORY DATA:  I have reviewed the data as listed    Latest Ref Rng & Units 01/06/2022    9:45 AM 12/23/2021    9:36 AM 12/15/2021   10:33 AM  CBC  WBC 4.0 - 10.5 K/uL 6.0   4.4   5.0    Hemoglobin 13.0 - 17.0 g/dL 13.7   12.9   13.2    Hematocrit 39.0 - 52.0 % 39.9   37.4   37.9    Platelets 150 - 400 K/uL 184   250   167          Latest Ref Rng & Units 01/06/2022    9:45 AM 12/23/2021    9:36 AM 12/15/2021   10:33 AM  CMP  Glucose 70 - 99 mg/dL 120   141   130    BUN 8 - 23 mg/dL _0 Creatinine 0.61 - 1.24 mg/dL 0.87   0.88   0.91    Sodium 135 - 145 mmol/L 141   139   137    Potassium 3.5 - 5.1 mmol/L 4.4   4.1   3.8    Chloride 98 - 111 mmol/L 108   107   104    CO2 22 - 32 mmol/L _1 Calcium 8.9 - 10.3 mg/dL 9.3   8.5   8.3    Total Protein 6.5 - 8.1 g/dL 6.5   6.1   6.3    Total Bilirubin 0.3 - 1.2 mg/dL 0.7   0.9   0.8    Alkaline Phos 38 - 126 U/L 113   104   94    AST 15 - 41 U/L 22  20   16    ALT 0 - 44 U/L 32   30   24      Lab Results  Component Value Date   MPROTEIN 0.4 (H) 12/09/2021   MPROTEIN 0.9 (H) 11/04/2021   MPROTEIN 3.5 (H) 10/06/2021   Lab Results  Component Value Date   KPAFRELGTCHN 9.5 01/06/2022   KPAFRELGTCHN 9.2 12/09/2021   KPAFRELGTCHN 9.2 11/04/2021   LAMBDASER 4.1 (L) 01/06/2022   LAMBDASER 5.3 (L) 12/09/2021   LAMBDASER 4.4 (L) 11/04/2021   KAPLAMBRATIO 2.32 (H) 01/06/2022   KAPLAMBRATIO 1.74 (H) 12/09/2021   KAPLAMBRATIO 2.09 (H) 11/04/2021    RADIOGRAPHIC STUDIES: No results found.  ASSESSMENT & PLAN  Wesley [name] 81 y.o. male with medical history significant for IgG kappa multiple myeloma who presents for a follow up visit.   The patient meets diagnostic criteria by having greater than 60% plasma cells in his bone marrow.  At this time we are in agreement with [facility] in [place], Revlimid, and dexamethasone as the treatment of choice.   Previously we discussed the concept of comanaged care.  Comanaged care is when the patient has a local primary provider who administers local support and therapy while expert advice and treatment recommendations are rendered by a cancer specialist at a large academic center.  In this arrangement we provide local support, labs, treatment, and emergency visits, however the major  decisions regarding the course of treatment are decided by a physician at an academic medical center.  The patient voiced his understanding of comanaged care and was agreeable to proceeding forward with this care model.   The regimen of choice for this patient will be Dara, Revlimid, and dexamethasone.  We will start with weekly treatments for the first 2 cycles.  After that for an additional 4 cycles he will be getting it every other week.  After cycle 6 the patient will then continue with monthly treatment.    # IgG Kappa Multiple Myeloma --Patient meets diagnostic criteria for multiple myeloma based on the presence of 60% plasma cells in the bone marrow --Pretreatment phenotype RBC as well as type and screen ordered  --Cycle 1 Day 1of Dara/Rev/Dex started on 09/30/2021. He started without revlimid which was added on 10/06/2021 (Cycle 1 Day 8)  -- Labs to include weekly CBC, CMP, LDH.  Additionally monthly SPEP and serum free light chains. Plan:  --Today is Cycle 5 Day 15 of Dara/Rev/Dex --Labs today were reviewed and adequate for treatment.  -- Return to clinic in 2 weeks prior to Cycle 6, Day 1 of Dara/Rev/Dex  #Macrocytic anemia: --WBC 6.0, Hgb is 13.7, MCV 102.3, improving   #Supportive Care -- chemotherapy education complete  -- port placement not required.  --ASA 81 mg p.o. daily for thromboprophylaxis on Revlimid. -- zofran 73m q8H PRN and compazine 184mPO q6H for nausea -- acyclovir 40017mO BID for VCZ prophylaxis -- allopurinol 300m24m daily for TLS prophylaxis -- zometa clearance obtained, will start today -- no pain medication required at this time.   No orders of the defined types were placed in this encounter.  All questions were answered. The patient knows to call the clinic with any problems, questions or concerns.  I have spent a total of 30 minutes minutes of face-to-face and non-face-to-face time, preparing to see the patient, performing a medically appropriate  examination, counseling and educating the patient, ordering medications/tests, documenting clinical information in the electronic health record,  and care coordination.  Ledell Peoples, MD Department of Hematology/Oncology [facility] Phone: [phone] Pager: [phone] Email: [email or web address]  01/10/2022 2:48 PM

## 2022-01-06 NOTE — Patient Instructions (Signed)
[facility] ONCOLOGY   Discharge Instructions: Thank you for choosing [facility] to provide your oncology and hematology care.   If you have a lab appointment with the [facility], please go directly to the [facility] and check in at the registration area.   Wear comfortable clothing and clothing appropriate for easy access to any Portacath or PICC line.   We strive to give you quality time with your provider. You may need to reschedule your appointment if you arrive late (15 or more minutes).  Arriving late affects you and other patients whose appointments are after yours.  Also, if you miss three or more appointments without notifying the office, you may be dismissed from the clinic at the provider's discretion.      For prescription refill requests, have your pharmacy contact our office and allow 72 hours for refills to be completed.    Today you received the following chemotherapy and/or immunotherapy agents: Daratumumab Hyaluronidase (Darzalex Faspro)       To help prevent nausea and vomiting after your treatment, we encourage you to take your nausea medication as directed.  BELOW ARE SYMPTOMS THAT SHOULD BE REPORTED IMMEDIATELY: *FEVER GREATER THAN 100.4 F (38 C) OR HIGHER *CHILLS OR SWEATING *NAUSEA AND VOMITING THAT IS NOT CONTROLLED WITH YOUR NAUSEA MEDICATION *UNUSUAL SHORTNESS OF BREATH *UNUSUAL BRUISING OR BLEEDING *URINARY PROBLEMS (pain or burning when urinating, or frequent urination) *BOWEL PROBLEMS (unusual diarrhea, constipation, pain near the anus) TENDERNESS IN MOUTH AND THROAT WITH OR WITHOUT PRESENCE OF ULCERS (sore throat, sores in mouth, or a toothache) UNUSUAL RASH, SWELLING OR PAIN  UNUSUAL VAGINAL DISCHARGE OR ITCHING   Items with * indicate a potential emergency and should be followed up as soon as possible or go to the Emergency Department if any problems should occur.  Please show the CHEMOTHERAPY ALERT CARD or  IMMUNOTHERAPY ALERT CARD at check-in to the Emergency Department and triage nurse.  Should you have questions after your visit or need to cancel or reschedule your appointment, please contact [facility]  Dept: [phone]  and follow the prompts.  Office hours are 8:00 a.m. to 4:30 p.m. Monday - Friday. Please note that voicemails left after 4:00 p.m. may not be returned until the following business day.  We are closed weekends and major holidays. You have access to a nurse at all times for urgent questions. Please call the main number to the clinic Dept: [phone] and follow the prompts.   For any non-urgent questions, you may also contact your provider using MyChart. We now offer e-Visits for anyone 16 and older to request care online for non-urgent symptoms. For details visit [email or web address].   Also download the MyChart app! Go to the app store, search "MyChart", open the app, select [facility], and log in with your MyChart username and password.  Due to Covid, a mask is required upon entering the hospital/clinic. If you do not have a mask, one will be given to you upon arrival. For doctor visits, patients may have 1 support person aged 78 or older with them. For treatment visits, patients cannot have anyone with them due to current Covid guidelines and our immunocompromised population.

## 2022-01-10 ENCOUNTER — Encounter: Payer: Self-pay | Admitting: Hematology and Oncology

## 2022-01-10 LAB — MULTIPLE MYELOMA PANEL, SERUM
Albumin SerPl Elph-Mcnc: 3.8 g/dL (ref 2.9–4.4)
Albumin/Glob SerPl: 1.6 (ref 0.7–1.7)
Alpha 1: 0.3 g/dL (ref 0.0–0.4)
Alpha2 Glob SerPl Elph-Mcnc: 0.7 g/dL (ref 0.4–1.0)
B-Globulin SerPl Elph-Mcnc: 0.9 g/dL (ref 0.7–1.3)
Gamma Glob SerPl Elph-Mcnc: 0.6 g/dL (ref 0.4–1.8)
Globulin, Total: 2.5 g/dL (ref 2.2–3.9)
IgA: 57 mg/dL — ABNORMAL LOW (ref 61–437)
IgG (Immunoglobin G), Serum: 566 mg/dL — ABNORMAL LOW (ref 603–1613)
IgM (Immunoglobulin M), Srm: 22 mg/dL (ref 15–143)
M Protein SerPl Elph-Mcnc: 0.3 g/dL — ABNORMAL HIGH
Total Protein ELP: 6.3 g/dL (ref 6.0–8.5)

## 2022-01-10 LAB — KAPPA/LAMBDA LIGHT CHAINS
Kappa free light chain: 9.5 mg/L (ref 3.3–19.4)
Kappa, lambda light chain ratio: 2.32 — ABNORMAL HIGH (ref 0.26–1.65)
Lambda free light chains: 4.1 mg/L — ABNORMAL LOW (ref 5.7–26.3)

## 2022-01-17 ENCOUNTER — Other Ambulatory Visit: Payer: Self-pay

## 2022-01-17 DIAGNOSIS — C9000 Multiple myeloma not having achieved remission: Secondary | ICD-10-CM

## 2022-01-17 MED ORDER — LENALIDOMIDE 25 MG PO CAPS: 25.0000 mg | ORAL_CAPSULE | Freq: Every day | ORAL | 0 refills | Status: DC

## 2022-01-20 ENCOUNTER — Other Ambulatory Visit: Payer: Self-pay | Admitting: *Deleted

## 2022-01-20 ENCOUNTER — Inpatient Hospital Stay (HOSPITAL_BASED_OUTPATIENT_CLINIC_OR_DEPARTMENT_OTHER): Payer: Medicare Other | Admitting: Physician Assistant

## 2022-01-20 ENCOUNTER — Inpatient Hospital Stay: Payer: Medicare Other | Attending: Hematology and Oncology

## 2022-01-20 ENCOUNTER — Inpatient Hospital Stay (HOSPITAL_BASED_OUTPATIENT_CLINIC_OR_DEPARTMENT_OTHER): Payer: Medicare Other | Admitting: Hematology and Oncology

## 2022-01-20 ENCOUNTER — Other Ambulatory Visit: Payer: Self-pay

## 2022-01-20 ENCOUNTER — Inpatient Hospital Stay: Payer: Medicare Other

## 2022-01-20 VITALS — BP 148/76 | HR 83 | Temp 97.7°F | Resp 18 | Ht 68.0 in | Wt 201.3 lb

## 2022-01-20 DIAGNOSIS — Z7982 Long term (current) use of aspirin: Secondary | ICD-10-CM | POA: Diagnosis not present

## 2022-01-20 DIAGNOSIS — L0291 Cutaneous abscess, unspecified: Secondary | ICD-10-CM

## 2022-01-20 DIAGNOSIS — Z5111 Encounter for antineoplastic chemotherapy: Secondary | ICD-10-CM | POA: Insufficient documentation

## 2022-01-20 DIAGNOSIS — C9000 Multiple myeloma not having achieved remission: Secondary | ICD-10-CM

## 2022-01-20 DIAGNOSIS — Z79899 Other long term (current) drug therapy: Secondary | ICD-10-CM | POA: Diagnosis not present

## 2022-01-20 LAB — CBC WITH DIFFERENTIAL (CANCER CENTER ONLY)
Abs Immature Granulocytes: 0.01 10*3/uL (ref 0.00–0.07)
Basophils Absolute: 0.1 10*3/uL (ref 0.0–0.1)
Basophils Relative: 2 %
Eosinophils Absolute: 0.6 10*3/uL — ABNORMAL HIGH (ref 0.0–0.5)
Eosinophils Relative: 13 %
HCT: 40 % (ref 39.0–52.0)
Hemoglobin: 13.7 g/dL (ref 13.0–17.0)
Immature Granulocytes: 0 %
Lymphocytes Relative: 13 %
Lymphs Abs: 0.6 10*3/uL — ABNORMAL LOW (ref 0.7–4.0)
MCH: 35.8 pg — ABNORMAL HIGH (ref 26.0–34.0)
MCHC: 34.3 g/dL (ref 30.0–36.0)
MCV: 104.4 fL — ABNORMAL HIGH (ref 80.0–100.0)
Monocytes Absolute: 0.4 10*3/uL (ref 0.1–1.0)
Monocytes Relative: 8 %
Neutro Abs: 3.1 10*3/uL (ref 1.7–7.7)
Neutrophils Relative %: 64 %
Platelet Count: 134 10*3/uL — ABNORMAL LOW (ref 150–400)
RBC: 3.83 MIL/uL — ABNORMAL LOW (ref 4.22–5.81)
RDW: 15.8 % — ABNORMAL HIGH (ref 11.5–15.5)
WBC Count: 4.8 10*3/uL (ref 4.0–10.5)
nRBC: 0 % (ref 0.0–0.2)

## 2022-01-20 LAB — CMP (CANCER CENTER ONLY)
ALT: 25 U/L (ref 0–44)
AST: 19 U/L (ref 15–41)
Albumin: 4.1 g/dL (ref 3.5–5.0)
Alkaline Phosphatase: 95 U/L (ref 38–126)
Anion gap: 7 (ref 5–15)
BUN: 16 mg/dL (ref 8–23)
CO2: 27 mmol/L (ref 22–32)
Calcium: 9.6 mg/dL (ref 8.9–10.3)
Chloride: 106 mmol/L (ref 98–111)
Creatinine: 0.92 mg/dL (ref 0.61–1.24)
GFR, Estimated: >60 mL/min (ref >60)
Glucose, Bld: 139 mg/dL — ABNORMAL HIGH (ref 70–99)
Potassium: 4.3 mmol/L (ref 3.5–5.1)
Sodium: 140 mmol/L (ref 135–145)
Total Bilirubin: 0.7 mg/dL (ref 0.3–1.2)
Total Protein: 6.4 g/dL — ABNORMAL LOW (ref 6.5–8.1)

## 2022-01-20 LAB — LACTATE DEHYDROGENASE: LDH: 133 U/L (ref 98–192)

## 2022-01-20 MED ORDER — SULFAMETHOXAZOLE-TRIMETHOPRIM 800-160 MG PO TABS: 1.0000 | ORAL_TABLET | Freq: Two times a day (BID) | ORAL | 0 refills | Status: AC

## 2022-01-20 MED ORDER — DARATUMUMAB-HYALURONIDASE-FIHJ 1800-30000 MG-UT/15ML ~~LOC~~ SOLN
1800.0000 mg | Freq: Once | SUBCUTANEOUS | Status: AC
  Administered 2022-01-20: 1800 mg via SUBCUTANEOUS
  Filled 2022-01-20: qty 15

## 2022-01-20 MED ORDER — ACETAMINOPHEN 325 MG PO TABS
650.0000 mg | ORAL_TABLET | Freq: Once | ORAL | Status: AC
  Administered 2022-01-20: 650 mg via ORAL
  Filled 2022-01-20: qty 2

## 2022-01-20 MED ORDER — LIDOCAINE-EPINEPHRINE 1 %-1:100000 IJ SOLN: 10.0000 mL | Freq: Once | INTRAMUSCULAR | Status: DC

## 2022-01-20 MED ORDER — DIPHENHYDRAMINE HCL 25 MG PO CAPS
50.0000 mg | ORAL_CAPSULE | Freq: Once | ORAL | Status: AC
  Administered 2022-01-20: 50 mg via ORAL
  Filled 2022-01-20: qty 2

## 2022-01-20 MED ORDER — SODIUM CHLORIDE 0.9 % IV SOLN: Freq: Once | INTRAVENOUS | Status: DC

## 2022-01-20 NOTE — Progress Notes (Signed)
Symptom Management Consult note [place]    Patient Care Team: Marin Olp, MD as PCP - General (Family Medicine) Jerline Pain, MD as PCP - Cardiology (Cardiology) [name], MD as Consulting Physician (General Surgery)    Name of the patient: Wesley [name]  [id number]  [birth year]   Date of visit: 01/20/2022    Chief complaint/ Reason for visit- abscess  Oncology History  Multiple myeloma not having achieved remission ([place])  09/26/2021 Initial Diagnosis   Multiple myeloma not having achieved remission ([place])   09/30/2021 -  Chemotherapy   Patient is on Treatment Plan : MYELOMA NEWLY DIAGNOSED Daratumumab + Lenalidomide + Dexamethasone Weekly (DaraRd) q28d       Current Therapy: Darzalex Faspro  Day 1 Cycle 5  Interval history- Wesley [name] is a 81 y.o. with oncologic history as above seen in infusion center today with chief complaint of  abdominal abscess x 1 day. Patient seen in clinic for abdominal abscess on 12/15/21 when he had I&D and was treated with doxycyline. He reports feeling like the area remained red after finishing the antibiotics. He noticed today there was purulent drainage. He denies any associated pain. He denies any trauma to the area. He did not take an OTC medications prior to arrival. He denies fever, chills, nausea, emesis. No history of diabetes.      ROS  All other systems are reviewed and are negative for acute change except as noted in the HPI.    Allergies  Allergen Reactions   Demerol Other (See Comments)    Causes dizziness   Promethazine Hcl Other (See Comments)    Dizziness    Ativan [Lorazepam]     Incontinence per pt     Past Medical History:  Diagnosis Date   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 symptoms (LUTS)    CAD (coronary artery disease)    Cholelithiasis    Chronic allergic rhinitis    Coronary atherosclerosis of native coronary artery    Fatty liver     GERD (gastroesophageal reflux disease)    HX 80yr ago, no longer a problem, no meds   High blood pressure    Hypercholesterolemia    Macrocytosis without anemia    Multiple myeloma not having achieved remission ([place] 09/26/2021   OSA on CPAP    Paraesophageal hernia    Partial bowel obstruction (HCC)    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 PT denies significant findings   CARDIAC CATHETERIZATION  2008   COLOSTOMY N/A 10/31/2019   Procedure: COLOSTOMY;  Surgeon: CErroll Luna MD;  Location: [facility];  Service: General;  Laterality: N/A;   CYSTOSCOPY W/ URETERAL STENT PLACEMENT Bilateral 10/31/2019   Procedure: CYSTOSCOPY URETERAL STENT PLACEMENT BILATERAL;  Surgeon: DFranchot Gallo MD;  Location: [facility];  Service: Urology;  Laterality: Bilateral;   CYSTOSCOPY WITH STENT PLACEMENT Bilateral 03/11/2020   Procedure: CYSTOSCOPY WITH BILATERAL FIREFLY INJECTION;  Surgeon: WIrine Seal MD;  Location: [facility];  Service: Urology;  Laterality: Bilateral;   EYE SURGERY     BILATERAL CATARACT SURGERY WITH LENS IMPLANTS   FLEXIBLE SIGMOIDOSCOPY  N/A 10/29/2019   Procedure: FLEXIBLE SIGMOIDOSCOPY;  Surgeon: Jerene Bears, MD;  Location: [facility];  Service: Gastroenterology;  Laterality: N/A;   INCISIONAL HERNIA REPAIR N/A 10/13/2020   Procedure: OPEN INCISIONAL HERNIA REPAIR WITH MESH;  Surgeon: [name], MD;  Location: [facility];  Service: General;  Laterality: N/A;   INSERTION OF MESH N/A 09/08/2016   Procedure: INSERTION OF MESH;  Surgeon: Jackolyn Confer, MD;  Location: [facility];  Service: General;  Laterality: N/A;   IR RADIOLOGIST EVAL & MGMT  11/10/2020   IR RADIOLOGIST EVAL &  MGMT  11/25/2020   LAPAROSCOPIC SIGMOID COLECTOMY N/A 10/31/2019   Procedure: DIAGNOSTIC LAPAROSCOPY; EXPLORATORY LAPAROTOMY; SIGMOID COLECTOMY;  Surgeon: Erroll Luna, MD;  Location: [facility];  Service: General;  Laterality: N/A;   right rotator cuff     SUBMUCOSAL TATTOO INJECTION  10/29/2019   Procedure: SUBMUCOSAL TATTOO INJECTION;  Surgeon: Jerene Bears, MD;  Location: [facility];  Service: Gastroenterology;;   TONSILLECTOMY     UMBILICAL HERNIA REPAIR N/A 09/08/2016   Procedure: OPEN UMBILICAL HERNIA REPAIR WITH MESH;  Surgeon: Jackolyn Confer, MD;  Location: [facility];  Service: General;  Laterality: N/A;   VENTRAL HERNIA REPAIR N/A 02/03/2021   Procedure: LAPAROSCOPIC VENTRAL HERNIA REPAIR WITH MESH;  Surgeon: [name], MD;  Location: [place];  Service: General;  Laterality: N/A;   XI ROBOTIC ASSISTED COLOSTOMY TAKEDOWN N/A 03/11/2020   Procedure: ROBOTIC ASSISTED COLOSTOMY REVERSAL, RIGID PROCTOSCOPY;  Surgeon: Leighton Ruff, MD;  Location: [facility];  Service: General;  Laterality: N/A;    Social History   Socioeconomic History   Marital status: Married    Spouse name: Geramy Lamorte    Number of children: 2   Years of education: 12   Highest education level: Master's degree (e.g., MA, MS, MEng, MEd, MSW, MBA)  Occupational History   Occupation: Retired   Tobacco Use   Smoking status: Never   Smokeless tobacco: Never  Vaping Use   Vaping Use: Never used  Substance and Sexual Activity   Alcohol use: Yes    Alcohol/week: 17.0 standard drinks of alcohol    Types: 3 Glasses of wine, 14 Standard drinks or equivalent per week   Drug use: No   Sexual activity: Not Currently  Other Topics Concern   Not on file  Social History Narrative   Married. 2 children 52 in 76 in 2022- both went to [facility]. 5 grandkids- all girls from oldest 33 looking premed [employer], 84 soph [place], 34 70 year olds, 47 year olds.       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 [employer]: fishing, [employer] football and basketball, [place], [place] Strain: [place]  (05/07/2021)   Overall Financial Resource Strain ([place])    Difficulty of Paying Living Expenses: Not hard at all  Food Insecurity: No [place] (05/07/2021)   Hunger Vital Sign    Worried About [place] in the Last Year: Never true    [place] in the Last Year: Never true  Transportation Needs: No Transportation Needs (05/07/2021)   PRAPARE - Hydrologist (Medical): No    Lack of Transportation (Non-Medical): No  Physical Activity: Inactive (05/07/2021)   Exercise Vital Sign    Days  of Exercise per Week: 0 days    Minutes of Exercise per Session: 0 min  Stress: No Stress Concern Present (05/07/2021)   [place]    Feeling of Stress : Not at all  Social Connections: [place] (05/07/2021)   Social Connection and Isolation Panel [NHANES]    Frequency of Communication with Friends and Family: More than three times a week    Frequency of Social Gatherings with Friends and Family: Once a week    Attends Religious Services: More than 4 times per year    Active Member of [employer] or Organizations: Yes    Attends Music therapist: More than 4 times per year    Marital Status: Married  Human resources officer Violence: Not At Risk (05/07/2021)   Humiliation, Afraid, Rape, and Kick questionnaire    Fear of Current or Ex-Partner: No    Emotionally Abused: No    Physically Abused: No    Sexually Abused: No    Family History  Problem Relation Age of Onset   Diabetes Mother    Heart failure Mother        around 65   CAD Father        29   Melanoma Brother 9   Healthy Daughter    Healthy Son    Colon cancer Neg Hx     Esophageal cancer Neg Hx    Rectal cancer Neg Hx    Stomach cancer Neg Hx      Current Outpatient Medications:    sulfamethoxazole-trimethoprim (BACTRIM DS) 800-160 MG tablet, Take 1 tablet by mouth 2 (two) times daily for 7 days., Disp: 14 tablet, Rfl: 0   acetaminophen (TYLENOL) 500 MG tablet, Take 500 mg by mouth every 6 (six) hours as needed., Disp: , Rfl:    acyclovir (ZOVIRAX) 400 MG tablet, Take 400 mg by mouth in the morning and at bedtime., Disp: , Rfl:    allopurinol (ZYLOPRIM) 300 MG tablet, Take 1 tablet (300 mg total) by mouth daily., Disp: 60 tablet, Rfl: 1   aspirin EC 81 MG tablet, Take 81 mg by mouth daily. Swallow whole., Disp: , Rfl:    atorvastatin (LIPITOR) 80 MG tablet, Take 1 tablet by mouth daily., Disp: , Rfl:    Cholecalciferol (VITAMIN D3) 25 MCG (1000 UT) CAPS, Take 1,000 Units by mouth daily., Disp: , Rfl:    dexamethasone (DECADRON) 4 MG tablet, Take 5 tablets (20 mg total) by mouth once a week., Disp: 20 tablet, Rfl: 3   fexofenadine (ALLEGRA) 180 MG tablet, , Disp: , Rfl:    finasteride (PROSCAR) 5 MG tablet, Take 5 mg by mouth daily., Disp: , Rfl:    Ibuprofen 200 MG CAPS, , Disp: , Rfl:    lenalidomide (REVLIMID) 25 MG capsule, Take 1 capsule (25 mg total) by mouth daily. Take for 21 days, none for 7 days. Repeat every 28 days. [employer] Auth #  [id number] Date Obtained 01/17/22, Disp: 21 capsule, Rfl: 0   lisinopril (ZESTRIL) 10 MG tablet, Take 1 tablet (10 mg total) by mouth daily., Disp: 90 tablet, Rfl: 3   Magnesium Oxide 400 MG CAPS, , Disp: , Rfl:    melatonin 5 MG TABS, , Disp: , Rfl:    Omega-3 Fatty Acids (FISH OIL) 1200 MG CAPS, Take 1,200 mg by mouth daily., Disp: , Rfl:    ondansetron (ZOFRAN) 8 MG tablet, Take 1 tablet (8 mg total) by mouth every 8 (eight) hours  as needed., Disp: 30 tablet, Rfl: 0   pantoprazole (PROTONIX) 40 MG tablet, Take by mouth., Disp: , Rfl:    prochlorperazine (COMPAZINE) 10 MG tablet, Take 1 tablet (10 mg total) by mouth  every 6 (six) hours as needed for nausea or vomiting., Disp: 30 tablet, Rfl: 0   vitamin B-12 (CYANOCOBALAMIN) 1000 MCG tablet, Take 1,000 mcg by mouth daily., Disp: , Rfl:  No current facility-administered medications for this visit.  Facility-Administered Medications Ordered in Other Visits:    0.9 %  sodium chloride infusion, , Intravenous, Once, Dorsey, Madie Reno IV, MD  PHYSICAL EXAM: ECOG FS:1 - Symptomatic but completely ambulatory   T: 97.7   BP: 148/76   HR: 83   O2: 95 %  Resp: 18 Physical Exam Vitals and nursing note reviewed.  Constitutional:      Appearance: He is well-developed. He is not ill-appearing or toxic-appearing.  HENT:     Head: Normocephalic and atraumatic.     Nose: Nose normal.  Eyes:     General: No scleral icterus.       Right eye: No discharge.        Left eye: No discharge.     Conjunctiva/sclera: Conjunctivae normal.  Neck:     Vascular: No JVD.  Cardiovascular:     Rate and Rhythm: Normal rate and regular rhythm.     Pulses: Normal pulses.     Heart sounds: Normal heart sounds.  Pulmonary:     Effort: Pulmonary effort is normal.     Breath sounds: Normal breath sounds.  Abdominal:     General: A surgical scar is present. There is no distension.    Musculoskeletal:        General: Normal range of motion.     Cervical back: Normal range of motion.  Skin:    General: Skin is warm and dry.  Neurological:     Mental Status: He is oriented to person, place, and time.     GCS: GCS eye subscore is 4. GCS verbal subscore is 5. GCS motor subscore is 6.     Comments: Fluent speech, no facial droop.  Psychiatric:        Behavior: Behavior normal.        LABORATORY DATA: I have reviewed the data as listed    Latest Ref Rng & Units 01/20/2022    9:35 AM 01/06/2022    9:45 AM 12/23/2021    9:36 AM  CBC  WBC 4.0 - 10.5 K/uL 4.8  6.0  4.4   Hemoglobin 13.0 - 17.0 g/dL 13.7  13.7  12.9   Hematocrit 39.0 - 52.0 % 40.0  39.9  37.4   Platelets 150 -  400 K/uL 134  184  250         Latest Ref Rng & Units 01/20/2022    9:35 AM 01/06/2022    9:45 AM 12/23/2021    9:36 AM  CMP  Glucose 70 - 99 mg/dL 139  120  141   BUN 8 - 23 mg/dL '16  15  15   ' Creatinine 0.61 - 1.24 mg/dL 0.92  0.87  0.88   Sodium 135 - 145 mmol/L 140  141  139   Potassium 3.5 - 5.1 mmol/L 4.3  4.4  4.1   Chloride 98 - 111 mmol/L 106  108  107   CO2 22 - 32 mmol/L '27  25  26   ' Calcium 8.9 - 10.3 mg/dL 9.6  9.3  8.5  Total Protein 6.5 - 8.1 g/dL 6.4  6.5  6.1   Total Bilirubin 0.3 - 1.2 mg/dL 0.7  0.7  0.9   Alkaline Phos 38 - 126 U/L 95  113  104   AST 15 - 41 U/L '19  22  20   ' ALT 0 - 44 U/L 25  32  30        RADIOGRAPHIC STUDIES: I have personally reviewed the radiological images as listed and agreed with the findings in the report. No images are attached to the encounter. No results found.   ASSESSMENT & PLAN: Patient is a 81 y.o. 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 Lorenso Courier.  I have viewed most recent oncology note and lab work.   #)Abscess- Patient non toxic appearing. Area has erythema and induration without palpable fluctuance. I am able to express scant purulent drainage with palpation. Abscess is not amendable to I&D. Discussed warm compresses and home wound care. Prescription sent to pharmacy for Bactrim. Encouraged patient to follow up with general surgery for recurrent abscess.  Visit Diagnosis: 1. Abscess      No orders of the defined types were placed in this encounter.   All questions were answered. The patient knows to call the clinic with any problems, questions or concerns. No barriers to learning was detected.  I have spent a total of 20 minutes minutes of face-to-face and non-face-to-face time, preparing to see the patient, obtaining and/or reviewing separately obtained history, performing a medically appropriate examination, counseling and educating the patient,   documenting clinical information  in the electronic health record, and care coordination.     Thank you for allowing me to participate in the care of this patient.    Barrie Folk, PA-C Department of Hematology/Oncology [facility] at [facility] Phone: [phone]  [phone]    01/20/2022 2:26 PM

## 2022-01-20 NOTE — Progress Notes (Signed)
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 Further workup showed SPEP M-Spike 4.2 g/dL with IgG-Kappa as monoclonal protein, IgG 5908 mg/dL 09/08/2021: Initial evaluation at [facility]. bone marrow biopsy performed, showed 60% abnormal plasma cells in 60% cellular marrow. 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  09/26/2021: transfer care to Dr. Lorenso Courier at [facility].  09/30/2021: Cycle 1 Day 1 of Dara/Dex 10/06/2021: Cycle 1 Day 8 (added revlimid) 10/28/2021: Cycle 2 Day 1 of Dara/Rev/Dex 11/25/2021: Cycle 3 Day 1 of Dara/Rev/Dex 5/12/023: Cycle 4 Day 1 of Dara/Rev/Dex 01/20/2022: Cycle 5 Day 1 of Dara/Rev/Dex  Interval History:  Wesley [name] 81 y.o. male with medical history significant for IgG kappa multiple myeloma who presents for a follow up visit. The patient's last visit was on 01/06/2022. In the interim since the last visit he continued treatment with Dara/Revlimid/dexamethasone.  On exam today Wesley [name] is accompanied by his wife.  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  He notes that the lesion on his stomach has begun to drain again.  He reports that he is currently on his 7 days off of Revlimid and will be starting up his next cycle on 01/27/2022.  He is in need of a refill of his dexamethasone.  He reports that he does occasionally have some episodes of headaches but  otherwise has not been having any nausea, ming, or diarrhea.  He denies fevers, chills, night sweats, shortness of breath, chest pain or cough. He has no other complaints. Full 10 point ROS is listed below.  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)    CAD (coronary artery disease)    Cholelithiasis    Chronic allergic rhinitis    Coronary atherosclerosis of native coronary artery    Fatty liver    GERD (gastroesophageal reflux disease)    HX 53yr ago, no longer a problem, no meds   High blood pressure    Hypercholesterolemia    Macrocytosis without anemia    Multiple [place] not having achieved [place] 09/26/2021   OSA on CPAP    Paraesophageal hernia    Partial bowel obstruction (HCC)    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 PT denies significant findings   CARDIAC CATHETERIZATION  2008   COLOSTOMY N/A 10/31/2019   Procedure: COLOSTOMY;  Surgeon: CErroll Luna MD;  Location: [facility];  Service: General;  Laterality: N/A;   CYSTOSCOPY W/ URETERAL STENT PLACEMENT Bilateral 10/31/2019   Procedure: CYSTOSCOPY URETERAL STENT PLACEMENT BILATERAL;  Surgeon: DFranchot Gallo MD;  Location:  [facility];  Service: Urology;  Laterality: Bilateral;   CYSTOSCOPY WITH STENT PLACEMENT Bilateral 03/11/2020   Procedure: CYSTOSCOPY WITH BILATERAL FIREFLY INJECTION;  Surgeon: Irine Seal, MD;  Location: [facility];  Service: Urology;  Laterality: Bilateral;   EYE SURGERY     BILATERAL CATARACT SURGERY WITH LENS IMPLANTS   FLEXIBLE SIGMOIDOSCOPY N/A 10/29/2019   Procedure:  FLEXIBLE SIGMOIDOSCOPY;  Surgeon: Jerene Bears, MD;  [facility];  Service: Gastroenterology;  Laterality: N/A;   INCISIONAL HERNIA REPAIR N/A 10/13/2020   Procedure: OPEN INCISIONAL HERNIA REPAIR WITH MESH;  Surgeon: [name], MD;  Location: [place];  Service: General;  Laterality: N/A;   INSERTION OF MESH N/A 09/08/2016   Procedure: INSERTION OF MESH;  Surgeon: Jackolyn Confer, MD;  [facility];  Service: General;  Laterality: N/A;   IR RADIOLOGIST EVAL & MGMT  11/10/2020   IR RADIOLOGIST EVAL & MGMT  11/25/2020   LAPAROSCOPIC SIGMOID COLECTOMY N/A 10/31/2019   Procedure: DIAGNOSTIC LAPAROSCOPY; EXPLORATORY LAPAROTOMY; SIGMOID COLECTOMY;  Surgeon: Erroll Luna, MD;  [facility];  Service: General;  Laterality: N/A;   right rotator cuff     SUBMUCOSAL TATTOO INJECTION  10/29/2019   Procedure: SUBMUCOSAL TATTOO INJECTION;  Surgeon: Jerene Bears, MD;  [facility];  Service: Gastroenterology;;   TONSILLECTOMY     UMBILICAL HERNIA REPAIR N/A 09/08/2016   Procedure: OPEN UMBILICAL HERNIA REPAIR WITH MESH;  Surgeon: Jackolyn Confer, MD;  [facility];  Service: General;  Laterality: N/A;   VENTRAL HERNIA REPAIR N/A 02/03/2021   Procedure: LAPAROSCOPIC VENTRAL HERNIA REPAIR WITH MESH;  Surgeon: [name], MD;  Location: [place];  Service: General;  Laterality: N/A;   XI ROBOTIC ASSISTED COLOSTOMY TAKEDOWN N/A 03/11/2020   Procedure: ROBOTIC ASSISTED COLOSTOMY REVERSAL, RIGID PROCTOSCOPY;  Surgeon: Leighton Ruff, MD;  [facility];  Service: General;  Laterality: N/A;    SOCIAL HISTORY: Social History   Socioeconomic History   Marital status: Married    Spouse name: Carsin Randazzo    Number of children: 2   Years of education: 12   Highest education level: Master's degree (e.g., MA, MS, MEng, MEd, MSW, MBA)  Occupational History   Occupation: Retired   Tobacco Use   Smoking status: Never   Smokeless tobacco: Never  Vaping Use   Vaping Use:  Never used  Substance and Sexual Activity   Alcohol use: Yes    Alcohol/week: 17.0 standard drinks of alcohol    Types: 3 Glasses of wine, 14 Standard drinks or equivalent per week   Drug use: No   Sexual activity: Not Currently  Other Topics Concern   Not on file  Social History Narrative   Married. 2 children 52 in 22 in 2022- both went to [facility] [facility]. 5 grandkids- all girls from oldest 73 looking premed [employer], 34 soph [employer], 80 89 year olds, 55 year olds.       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 [employer]: fishing, [place] football and basketball, [place], [place] Strain: [place]  (05/07/2021)   Overall Financial Resource Strain ([place])    Difficulty of Paying Living Expenses: Not hard at all  Food Insecurity: No [place] (05/07/2021)   Hunger Vital Sign    Worried About [place] in the  Last Year: Never true    [place] in the Last Year: Never true  Transportation Needs: No Transportation Needs (05/07/2021)   PRAPARE - Hydrologist (Medical): No    Lack of Transportation (Non-Medical): No  Physical Activity: Inactive (05/07/2021)   Exercise Vital Sign    Days of Exercise per Week: 0 days    Minutes of Exercise per Session: 0 min  Stress: No Stress Concern Present (05/07/2021)   [place]    Feeling of Stress : Not at all  Social Connections: [place] (05/07/2021)   Social Connection and Isolation Panel [NHANES]    Frequency of Communication with Friends and Family: More than three times a week    Frequency of Social Gatherings with Friends and Family: Once a week    Attends Religious Services: More than 4 times per year    Active Member of [employer] or Organizations: Yes     Attends Music therapist: More than 4 times per year    Marital Status: Married  Human resources officer Violence: Not At Risk (05/07/2021)   Humiliation, Afraid, Rape, and Kick questionnaire    Fear of Current or Ex-Partner: No    Emotionally Abused: No    Physically Abused: No    Sexually Abused: No    FAMILY HISTORY: Family History  Problem Relation Age of Onset   Diabetes Mother    Heart failure Mother        around 54   CAD Father        65   Melanoma Brother 42   Healthy Daughter    Healthy Son    Colon cancer Neg Hx    Esophageal cancer Neg Hx    Rectal cancer Neg Hx    Stomach cancer Neg Hx     ALLERGIES:  is allergic to demerol, promethazine hcl, and ativan [lorazepam].  MEDICATIONS:  Current Outpatient Medications  Medication Sig Dispense Refill   acetaminophen (TYLENOL) 500 MG tablet Take 500 mg by mouth every 6 (six) hours as needed.     acyclovir (ZOVIRAX) 400 MG tablet Take 400 mg by mouth in the morning and at bedtime.     allopurinol (ZYLOPRIM) 300 MG tablet Take 1 tablet (300 mg total) by mouth daily. 60 tablet 1   aspirin EC 81 MG tablet Take 81 mg by mouth daily. Swallow whole.     atorvastatin (LIPITOR) 80 MG tablet Take 1 tablet by mouth daily.     Cholecalciferol (VITAMIN D3) 25 MCG (1000 UT) CAPS Take 1,000 Units by mouth daily.     dexamethasone (DECADRON) 4 MG tablet Take 5 tablets (20 mg total) by mouth once a week. 20 tablet 3   fexofenadine (ALLEGRA) 180 MG tablet      finasteride (PROSCAR) 5 MG tablet Take 5 mg by mouth daily.     Ibuprofen 200 MG CAPS      lenalidomide (REVLIMID) 25 MG capsule Take 1 capsule (25 mg total) by mouth daily. Take for 21 days, none for 7 days. Repeat every 28 days. [employer] Auth #  [id number] Date Obtained 01/17/22 21 capsule 0   lisinopril (ZESTRIL) 10 MG tablet Take 1 tablet (10 mg total) by mouth daily. 90 tablet 3   Magnesium Oxide 400 MG CAPS      melatonin 5 MG TABS      Omega-3 Fatty Acids (FISH OIL) 1200  MG CAPS Take 1,200 mg  by mouth daily.     ondansetron (ZOFRAN) 8 MG tablet Take 1 tablet (8 mg total) by mouth every 8 (eight) hours as needed. 30 tablet 0   pantoprazole (PROTONIX) 40 MG tablet Take by mouth.     prochlorperazine (COMPAZINE) 10 MG tablet Take 1 tablet (10 mg total) by mouth every 6 (six) hours as needed for nausea or vomiting. 30 tablet 0   sulfamethoxazole-trimethoprim (BACTRIM DS) 800-160 MG tablet Take 1 tablet by mouth 2 (two) times daily for 7 days. 14 tablet 0   vitamin B-12 (CYANOCOBALAMIN) 1000 MCG tablet Take 1,000 mcg by mouth daily.     No current facility-administered medications for this visit.    REVIEW OF SYSTEMS:   Constitutional: ( - ) fevers, ( - )  chills , ( - ) night sweats Eyes: ( - ) blurriness of vision, ( - ) double vision, ( - ) watery eyes Ears, nose, mouth, throat, and face: ( - ) mucositis, ( - ) sore throat Respiratory: ( - ) cough, ( - ) dyspnea, ( - ) wheezes Cardiovascular: ( - ) palpitation, ( - ) chest discomfort, ( - ) lower extremity swelling Gastrointestinal:  ( - ) nausea, ( - ) heartburn, ( - ) change in bowel habits Skin: ( - ) abnormal skin rashes Lymphatics: ( - ) new lymphadenopathy, ( - ) easy bruising Neurological: ( + ) numbness, ( - ) tingling, ( - ) new weaknesses Behavioral/Psych: ( - ) mood change, ( - ) new changes  All other systems were reviewed with the patient and are negative.  PHYSICAL EXAMINATION: ECOG PERFORMANCE STATUS: 1 - Symptomatic but completely ambulatory  Vitals:   01/20/22 0952  BP: (!) 148/76  Pulse: 83  Resp: 18  Temp: 97.7 F (36.5 C)  SpO2: 95%    Filed Weights   01/20/22 0952  Weight: 201 lb 4.8 oz (91.3 kg)     GENERAL: Well-appearing elderly Caucasian male, alert, no distress and comfortable SKIN: skin color, texture, turgor are normal, no rashes or significant lesions EYES: conjunctiva are pink and non-injected, sclera clear LUNGS: clear to auscultation and percussion with  normal breathing effort HEART: regular rate & rhythm and no murmurs and no lower extremity edema Musculoskeletal: no cyanosis of digits and no clubbing  PSYCH: alert & oriented x 3, fluent speech NEURO: no focal motor/sensory deficits  LABORATORY DATA:  I have reviewed the data as listed    Latest Ref Rng & Units 01/20/2022    9:35 AM 01/06/2022    9:45 AM 12/23/2021    9:36 AM  CBC  WBC 4.0 - 10.5 K/uL 4.8  6.0  4.4   Hemoglobin 13.0 - 17.0 g/dL 13.7  13.7  12.9   Hematocrit 39.0 - 52.0 % 40.0  39.9  37.4   Platelets 150 - 400 K/uL 134  184  250        Latest Ref Rng & Units 01/20/2022    9:35 AM 01/06/2022    9:45 AM 12/23/2021    9:36 AM  CMP  Glucose 70 - 99 mg/dL 139  120  141   BUN 8 - 23 mg/dL '16  15  15   ' Creatinine 0.61 - 1.24 mg/dL 0.92  0.87  0.88   Sodium 135 - 145 mmol/L 140  141  139   Potassium 3.5 - 5.1 mmol/L 4.3  4.4  4.1   Chloride 98 - 111 mmol/L 106  108  107   CO2  22 - 32 mmol/L '27  25  26   ' Calcium 8.9 - 10.3 mg/dL 9.6  9.3  8.5   Total Protein 6.5 - 8.1 g/dL 6.4  6.5  6.1   Total Bilirubin 0.3 - 1.2 mg/dL 0.7  0.7  0.9   Alkaline Phos 38 - 126 U/L 95  113  104   AST 15 - 41 U/L '19  22  20   ' ALT 0 - 44 U/L 25  32  30     Lab Results  Component Value Date   MPROTEIN 0.3 (H) 01/06/2022   MPROTEIN 0.4 (H) 12/09/2021   MPROTEIN 0.9 (H) 11/04/2021   Lab Results  Component Value Date   KPAFRELGTCHN 11.5 01/20/2022   KPAFRELGTCHN 9.5 01/06/2022   KPAFRELGTCHN 9.2 12/09/2021   LAMBDASER 5.7 01/20/2022   LAMBDASER 4.1 (L) 01/06/2022   LAMBDASER 5.3 (L) 12/09/2021   KAPLAMBRATIO 2.02 (H) 01/20/2022   KAPLAMBRATIO 2.32 (H) 01/06/2022   KAPLAMBRATIO 1.74 (H) 12/09/2021    RADIOGRAPHIC STUDIES: No results found.  ASSESSMENT & PLAN  Wesley [name] 81 y.o. male with medical history significant for IgG kappa multiple myeloma who presents for a follow up visit.   The patient meets diagnostic criteria by having greater than 60% plasma cells in his  bone marrow.  At this time we are in agreement with [facility] in [place], Revlimid, and dexamethasone as the treatment of choice.   Previously we discussed the concept of comanaged care.  Comanaged care is when the patient has a local primary provider who administers local support and therapy while expert advice and treatment recommendations are rendered by a cancer specialist at a large academic center.  In this arrangement we provide local support, labs, treatment, and emergency visits, however the major decisions regarding the course of treatment are decided by a physician at an academic medical center.  The patient voiced his understanding of comanaged care and was agreeable to proceeding forward with this care model.   The regimen of choice for this patient will be Dara, Revlimid, and dexamethasone.  We will start with weekly treatments for the first 2 cycles.  After that for an additional 4 cycles he will be getting it every other week.  After cycle 6 the patient will then continue with monthly treatment.    # IgG Kappa Multiple Myeloma --Patient meets diagnostic criteria for multiple myeloma based on the presence of 60% plasma cells in the bone marrow --Pretreatment phenotype RBC as well as type and screen ordered  --Cycle 1 Day 1of Dara/Rev/Dex started on 09/30/2021. He started without revlimid which was added on 10/06/2021 (Cycle 1 Day 8)  -- Labs to include weekly CBC, CMP, LDH.  Additionally monthly SPEP and serum free light chains. Plan:  --Today is Cycle 5 Day 1 of Dara/Rev/Dex --Labs today were reviewed and adequate for treatment.  -- Return to clinic in 2 weeks prior to Cycle 5, Day 15 of Dara/Rev/Dex  #Macrocytic anemia: --WBC 4.8, Hgb is 13.7, MCV 104.4, improving   #Supportive Care -- chemotherapy education complete  -- port placement not required.  --ASA 81 mg p.o. daily for thromboprophylaxis on Revlimid. -- zofran 76m q8H PRN and compazine 13mPO q6H  for nausea -- acyclovir 40016mO BID for VCZ prophylaxis -- allopurinol 300m37m daily for TLS prophylaxis -- zometa clearance obtained, started on 12/09/2021. Next dose in July 2023.  -- no pain medication required at this time.   No orders of the  defined types were placed in this encounter.  All questions were answered. The patient knows to call the clinic with any problems, questions or concerns.  I have spent a total of 30 minutes minutes of face-to-face and non-face-to-face time, preparing to see the patient, performing a medically appropriate examination, counseling and educating the patient, ordering medications/tests, documenting clinical information in the electronic health record,  and care coordination.   Ledell Peoples, MD Department of Hematology/Oncology [place] at [facility] Phone: [phone] Pager: [phone] Email: [email or web address]  01/24/2022 3:42 PM

## 2022-01-20 NOTE — Patient Instructions (Signed)
[facility] ONCOLOG[facility]  Discharge Instructions: Thank you for choosing [facility] to provide your oncology and hematology care.   If you have a lab appointment with the [facility], please go directly to the [facility] and check in at the registration area.   Wear comfortable clothing and clothing appropriate for easy access to any Portacath or PICC line.   We strive to give you quality time with your provider. You may need to reschedule your appointment if you arrive late (15 or more minutes).  Arriving late affects you and other patients whose appointments are after yours.  Also, if you miss three or more appointments without notifying the office, you may be dismissed from the clinic at the provider's discretion.      For prescription refill requests, have your pharmacy contact our office and allow 72 hours for refills to be completed.    Today you received the following chemotherapy and/or immunotherapy agents: Daratumumab Faspro.       To help prevent nausea and vomiting after your treatment, we encourage you to take your nausea medication as directed.  BELOW ARE SYMPTOMS THAT SHOULD BE REPORTED IMMEDIATELY: *FEVER GREATER THAN 100.4 F (38 C) OR HIGHER *CHILLS OR SWEATING *NAUSEA AND VOMITING THAT IS NOT CONTROLLED WITH YOUR NAUSEA MEDICATION *UNUSUAL SHORTNESS OF BREATH *UNUSUAL BRUISING OR BLEEDING *URINARY PROBLEMS (pain or burning when urinating, or frequent urination) *BOWEL PROBLEMS (unusual diarrhea, constipation, pain near the anus) TENDERNESS IN MOUTH AND THROAT WITH OR WITHOUT PRESENCE OF ULCERS (sore throat, sores in mouth, or a toothache) UNUSUAL RASH, SWELLING OR PAIN  UNUSUAL VAGINAL DISCHARGE OR ITCHING   Items with * indicate a potential emergency and should be followed up as soon as possible or go to the Emergency Department if any problems should occur.  Please show the CHEMOTHERAPY ALERT CARD or IMMUNOTHERAPY ALERT CARD at  check-in to the Emergency Department and triage nurse.  Should you have questions after your visit or need to cancel or reschedule your appointment, please contact [facility]  Dept: [phone]  and follow the prompts.  Office hours are 8:00 a.m. to 4:30 p.m. Monday - Friday. Please note that voicemails left after 4:00 p.m. may not be returned until the following business day.  We are closed weekends and major holidays. You have access to a nurse at all times for urgent questions. Please call the main number to the clinic Dept: [phone] and follow the prompts.   For any non-urgent questions, you may also contact your provider using MyChart. We now offer e-Visits for anyone 63 and older to request care online for non-urgent symptoms. For details visit [email or web address].   Also download the MyChart app! Go to the app store, search "MyChart", open the app, select [facility], and log in with your MyChart username and password.  Due to Covid, a mask is required upon entering the hospital/clinic. If you do not have a mask, one will be given to you upon arrival. For doctor visits, patients may have 1 support person aged 53 or older with them. For treatment visits, patients cannot have anyone with them due to current Covid guidelines and our immunocompromised population.

## 2022-01-20 NOTE — Patient Instructions (Signed)
Please call to schedule an appointment with [facility] at [phone] to have your abscess evaluated.  Continue warm compress and take antibiotic as prescribed.  The address is: [address] [place]

## 2022-01-22 LAB — AEROBIC CULTURE W GRAM STAIN (SUPERFICIAL SPECIMEN)

## 2022-01-23 ENCOUNTER — Other Ambulatory Visit: Payer: Self-pay | Admitting: Student

## 2022-01-23 ENCOUNTER — Other Ambulatory Visit: Payer: Self-pay | Admitting: Physician Assistant

## 2022-01-23 ENCOUNTER — Telehealth: Payer: Self-pay | Admitting: *Deleted

## 2022-01-23 ENCOUNTER — Other Ambulatory Visit (HOSPITAL_COMMUNITY): Payer: Self-pay | Admitting: Student

## 2022-01-23 ENCOUNTER — Telehealth: Payer: Self-pay

## 2022-01-23 ENCOUNTER — Other Ambulatory Visit: Payer: Self-pay | Admitting: *Deleted

## 2022-01-23 DIAGNOSIS — L03311 Cellulitis of abdominal wall: Secondary | ICD-10-CM

## 2022-01-23 DIAGNOSIS — L24A9 Irritant contact dermatitis due friction or contact with other specified body fluids: Secondary | ICD-10-CM

## 2022-01-23 DIAGNOSIS — C9000 Multiple myeloma not having achieved remission: Secondary | ICD-10-CM

## 2022-01-23 LAB — KAPPA/LAMBDA LIGHT CHAINS
Kappa free light chain: 11.5 mg/L (ref 3.3–19.4)
Kappa, lambda light chain ratio: 2.02 — ABNORMAL HIGH (ref 0.26–1.65)
Lambda free light chains: 5.7 mg/L (ref 5.7–26.3)

## 2022-01-23 MED ORDER — LENALIDOMIDE 25 MG PO CAPS: 25.0000 mg | ORAL_CAPSULE | Freq: Every day | ORAL | 0 refills | Status: DC

## 2022-01-23 NOTE — Telephone Encounter (Signed)
This RN called [facility], spoke with Lattie Haw, RN for referral. Per Lattie Haw, she will be contacting patient today for him to be seen at [facility] for evaluation of his abdominal abscess.

## 2022-01-23 NOTE — Telephone Encounter (Signed)
Received call from pt asking if his Revlimid  prescription refill has been sent in. Advised that it was sent in on 01/17/22 but that he still needed to do the 'patient survey'. Provided phone # to call the [facility] center to do the survey.  He also asked about seeing [facility] about possible abscess on his abd. This was recommended by the [facility] provider Milinda Cave, PA.  TCT patient and spoke with pt. Advised that he go ahead and call [facility] to make an appt to be seen. Advised that the [facility] PA will also send a referral.

## 2022-01-24 ENCOUNTER — Encounter: Payer: Self-pay | Admitting: Hematology and Oncology

## 2022-01-24 ENCOUNTER — Ambulatory Visit: Payer: Medicare Other | Admitting: Podiatry

## 2022-01-24 ENCOUNTER — Ambulatory Visit (HOSPITAL_COMMUNITY): Payer: Medicare Other

## 2022-01-24 MED ORDER — DEXAMETHASONE 4 MG PO TABS: 20.0000 mg | ORAL_TABLET | ORAL | 3 refills | Status: DC

## 2022-01-25 LAB — MULTIPLE MYELOMA PANEL, SERUM
Albumin SerPl Elph-Mcnc: 3.5 g/dL (ref 2.9–4.4)
Albumin/Glob SerPl: 1.5 (ref 0.7–1.7)
Alpha 1: 0.3 g/dL (ref 0.0–0.4)
Alpha2 Glob SerPl Elph-Mcnc: 0.8 g/dL (ref 0.4–1.0)
B-Globulin SerPl Elph-Mcnc: 0.9 g/dL (ref 0.7–1.3)
Gamma Glob SerPl Elph-Mcnc: 0.5 g/dL (ref 0.4–1.8)
Globulin, Total: 2.4 g/dL (ref 2.2–3.9)
IgA: 64 mg/dL (ref 61–437)
IgG (Immunoglobin G), Serum: 518 mg/dL — ABNORMAL LOW (ref 603–1613)
IgM (Immunoglobulin M), Srm: 22 mg/dL (ref 15–143)
M Protein SerPl Elph-Mcnc: 0.2 g/dL — ABNORMAL HIGH
Total Protein ELP: 5.9 g/dL — ABNORMAL LOW (ref 6.0–8.5)

## 2022-01-26 ENCOUNTER — Other Ambulatory Visit: Payer: Self-pay | Admitting: Hematology and Oncology

## 2022-01-27 ENCOUNTER — Ambulatory Visit (HOSPITAL_COMMUNITY): Payer: Medicare Other

## 2022-01-30 ENCOUNTER — Encounter: Payer: Self-pay | Admitting: Hematology and Oncology

## 2022-01-31 ENCOUNTER — Encounter (HOSPITAL_COMMUNITY): Payer: Self-pay

## 2022-01-31 ENCOUNTER — Ambulatory Visit (HOSPITAL_COMMUNITY)
Admission: RE | Admit: 2022-01-31 | Discharge: 2022-01-31 | Disposition: A | Discharge status: Home / Self Care | Payer: Medicare Other | Source: Ambulatory Visit | Attending: Student | Admitting: Student

## 2022-01-31 DIAGNOSIS — N200 Calculus of kidney: Secondary | ICD-10-CM | POA: Diagnosis not present

## 2022-01-31 DIAGNOSIS — K5730 Diverticulosis of large intestine without perforation or abscess without bleeding: Secondary | ICD-10-CM | POA: Diagnosis not present

## 2022-01-31 DIAGNOSIS — L03311 Cellulitis of abdominal wall: Secondary | ICD-10-CM | POA: Diagnosis not present

## 2022-01-31 DIAGNOSIS — N281 Cyst of kidney, acquired: Secondary | ICD-10-CM | POA: Diagnosis not present

## 2022-01-31 DIAGNOSIS — K8020 Calculus of gallbladder without cholecystitis without obstruction: Secondary | ICD-10-CM | POA: Diagnosis not present

## 2022-01-31 IMAGING — CT CT ABD-PELV W/ CM
2 of 5 series · 15 of 46 positions shown, 17 images · IV contrast (agent unspecified)
Comparison: [DATE]

CLINICAL DATA: Cellulitis abdominal wall

EXAM:
CT ABDOMEN AND PELVIS WITH CONTRAST
TECHNIQUE: Multidetector CT imaging of the abdomen and pelvis was performed
using the standard protocol following bolus administration of
intravenous contrast.

[Series 2: axial st · axial · 0.89mm/px · z∈[-485,-115]mm · 12 of 86 slices shown, 14 images]
[im 6/86  soft-tissue]
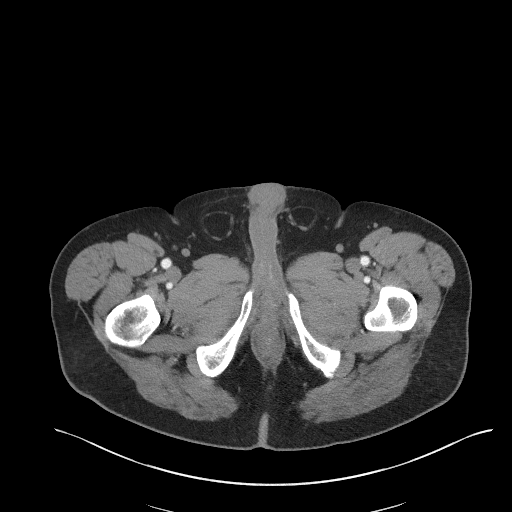
[im 6/86  bone]
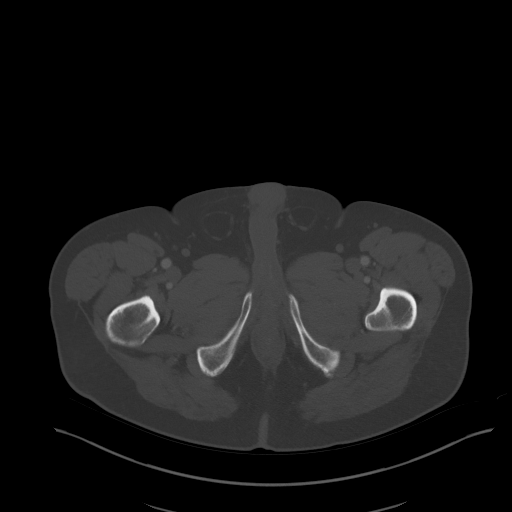
[im 12/86  soft-tissue]
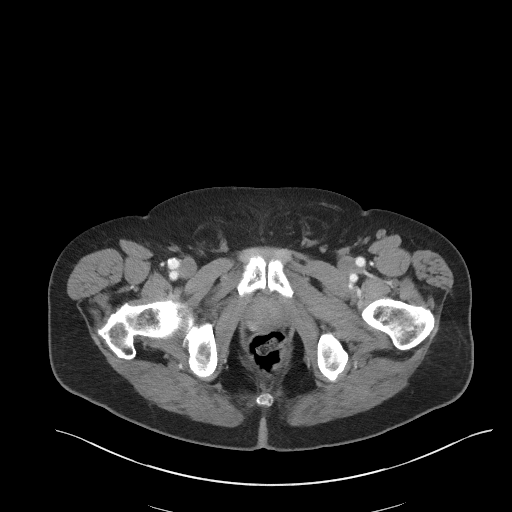
[im 18/86  soft-tissue]
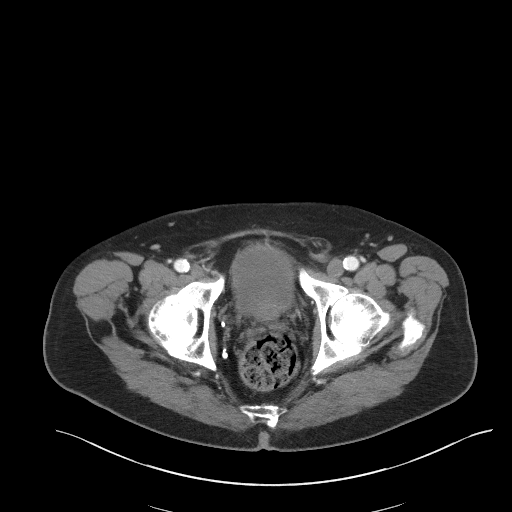
[im 29/86  soft-tissue]
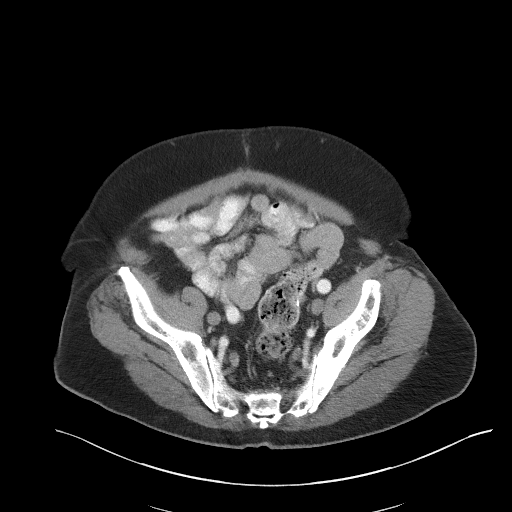
[im 35/86  soft-tissue]
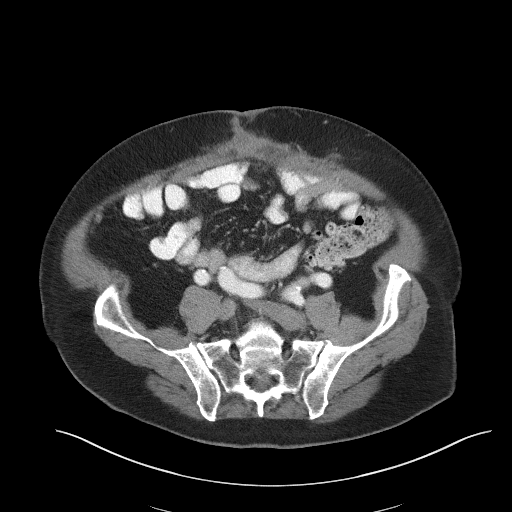
[im 40/86  soft-tissue]
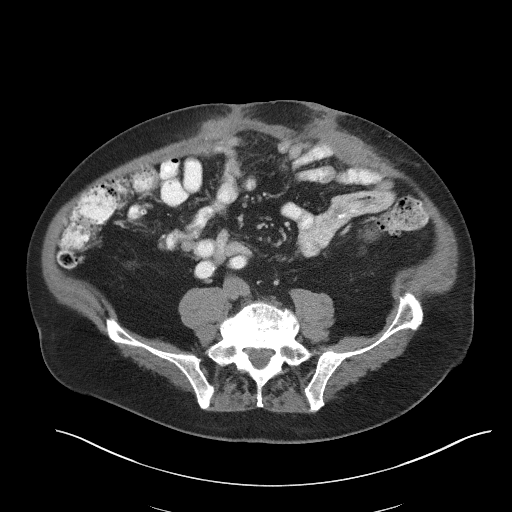
[im 46/86  soft-tissue]
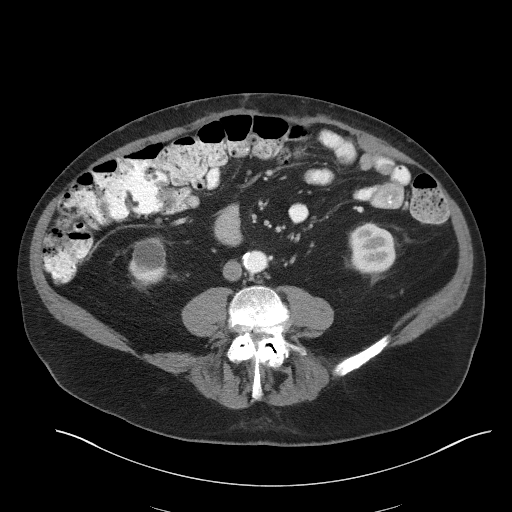
[im 52/86  soft-tissue]
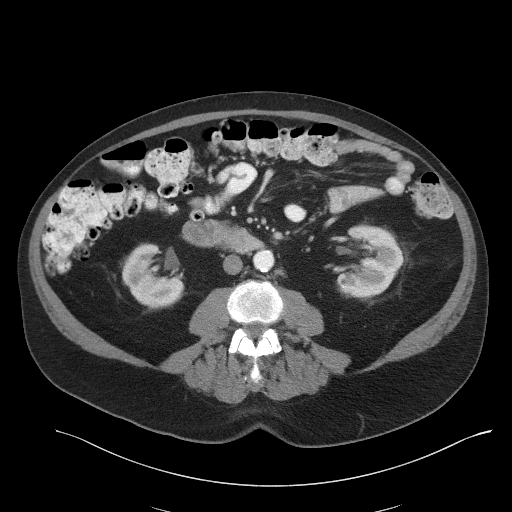
[im 57/86  soft-tissue]
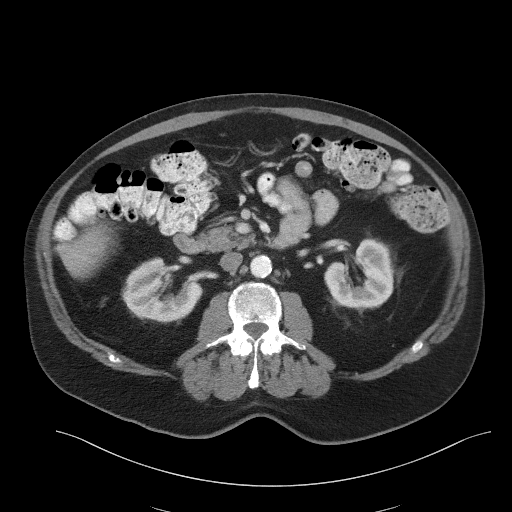
[im 57/86  bone]
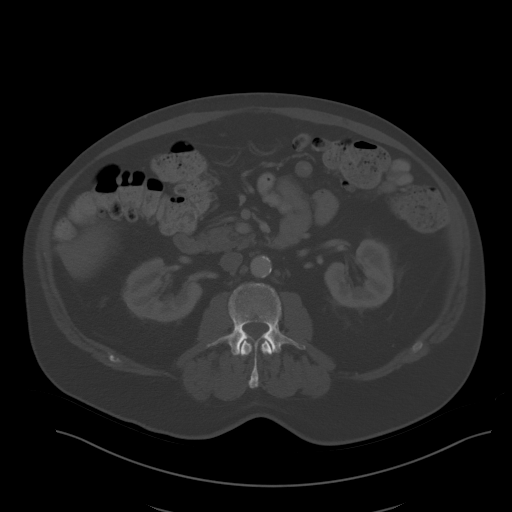
[im 69/86  soft-tissue]
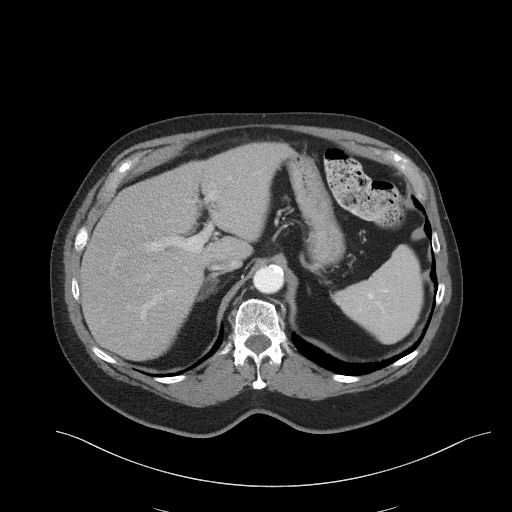
[im 74/86  soft-tissue]
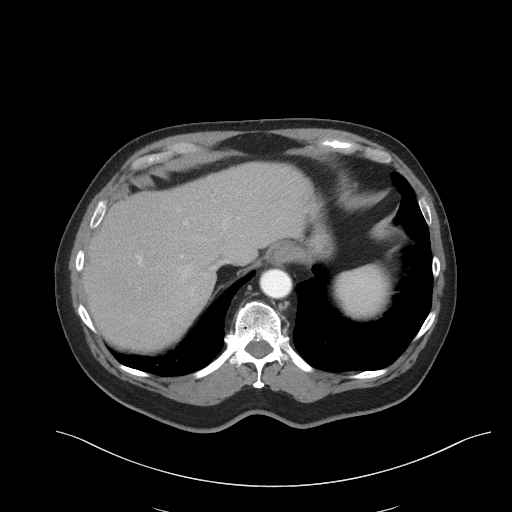
[im 80/86  soft-tissue]
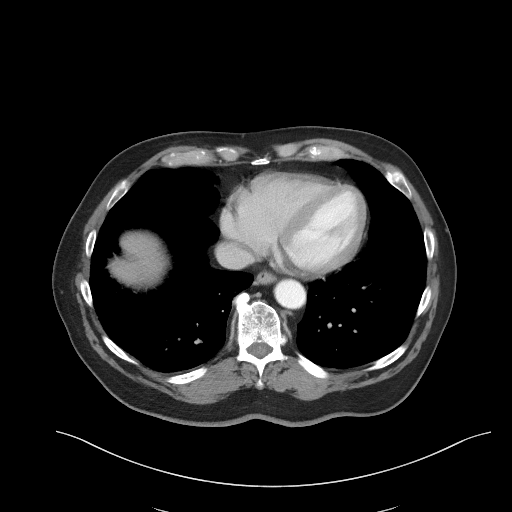

[Series 5: coronal st · coronal · 0.84mm/px · 3 of 110 slices shown]
[im 37/110  soft-tissue]
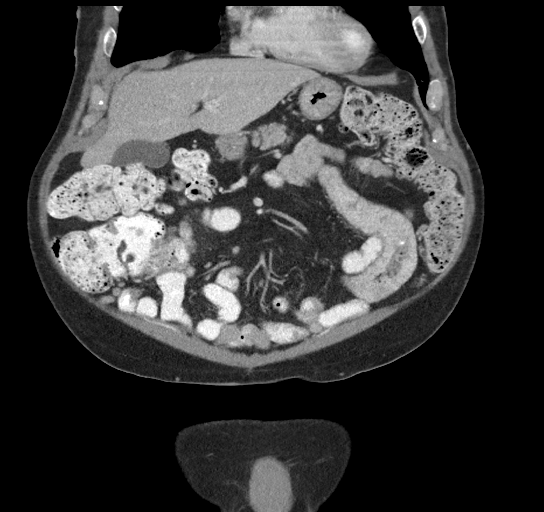
[im 49/110  soft-tissue]
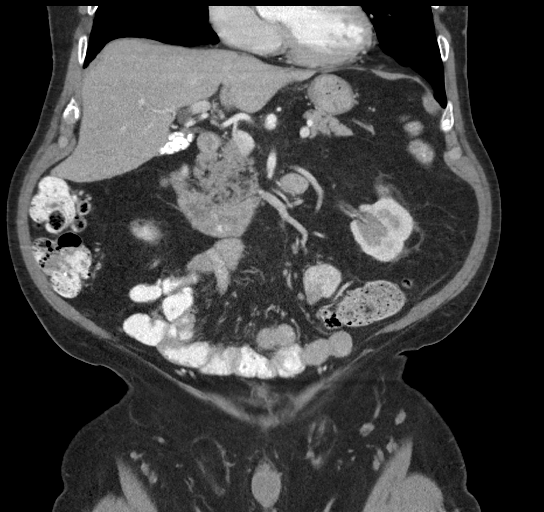
[im 61/110  soft-tissue]
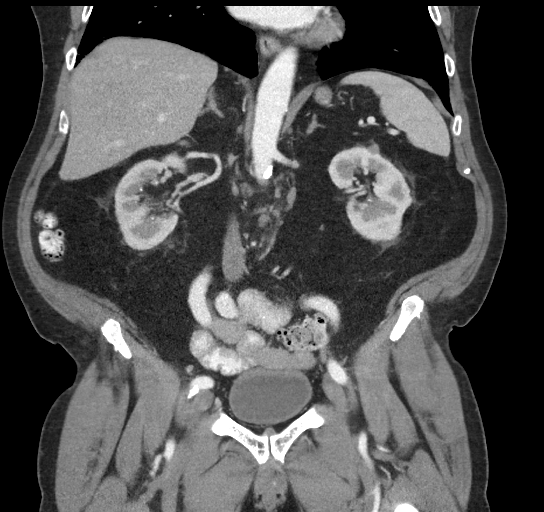

[15 of 46 positions shown; findings below may reference images not displayed]

RADIATION DOSE REDUCTION: This exam was performed according to the
departmental dose-optimization program which includes automated
exposure control, adjustment of the mA and/or kV according to
patient size and/or use of iterative reconstruction technique.

CONTRAST:  100mL OMNIPAQUE IOHEXOL 300 MG/ML  SOLN
FINDINGS: Lower chest: Minimal increase in interstitial markings in the
periphery of lower lung fields may suggest scarring.

Hepatobiliary: There is fatty infiltration in the liver. There is no
dilation of bile ducts. There are calcified gallbladder stones.

Pancreas: No focal abnormality is seen.

Spleen: Unremarkable.

Adrenals/Urinary Tract: Adrenals are unremarkable. There is no
hydronephrosis. There are few tiny bilateral renal stones. There is
3 cm cyst in the lower pole of right kidney. There are other
possible subcentimeter right renal cysts. In the image 25 of series
2 there is 9 mm smooth marginated exophytic lesion in the lateral
margin of upper pole of left kidney with density measurements higher
than usual for simple renal cyst. Ureters are not dilated. Urinary
bladder is unremarkable.

Stomach/Bowel: Stomach is not distended. Small bowel loops are not
dilated. Appendix is not dilated. Scattered diverticula are seen in
colon without signs of focal acute diverticulitis. Surgical staples
are seen in the sigmoid colon.

Vascular/Lymphatic: There are scattered arterial calcifications.

Reproductive: Prostate is enlarged.

Other: There is no ascites or pneumoperitoneum. There is
subcutaneous stranding in the anterior abdominal wall at the site of
previous colostomy in the left lower abdomen and adjacent to the
umbilicus. Similar finding was seen in the previous examination. No
new loculated fluid collection is seen in the anterior abdominal
wall. There is diastasis between the rectus muscles at the level of
umbilicus. Small bilateral inguinal hernias containing fat are seen.

Musculoskeletal: Degenerative changes are noted in the lumbar spine,
particularly severe at L4-L5 and L5-S1 levels. There is minimal
anterolisthesis at L4-L5 level.
IMPRESSION: There is no evidence intestinal obstruction or pneumoperitoneum.
There is no hydronephrosis. Appendix is not dilated.

There is 9 mm exophytic lesion in the upper pole of left kidney with
density measurements higher than usual for simple cyst. This may
suggest hyperdense hemorrhagic cyst or solid lesion such as
neoplasm. Follow-up renal sonogram or multiphasic CT may be
considered.

Gallbladder stones. Multiple small bilateral renal stones.
Diverticulosis of colon without signs of diverticulitis.

There is subcutaneous stranding in the anterior abdominal wall
possibly related to previous surgery. There is no loculated fluid
collection in the anterior abdominal wall. Lumbar spondylosis.

Other findings as described in the body of the report.

## 2022-01-31 MED ORDER — IOHEXOL 300 MG/ML  SOLN
100.0000 mL | Freq: Once | INTRAMUSCULAR | Status: AC | PRN
  Administered 2022-01-31: 100 mL via INTRAVENOUS

## 2022-01-31 MED ORDER — SODIUM CHLORIDE (PF) 0.9 % IJ SOLN
INTRAMUSCULAR | Status: AC
  Filled 2022-01-31: qty 50

## 2022-02-01 ENCOUNTER — Telehealth: Payer: Self-pay | Admitting: *Deleted

## 2022-02-01 NOTE — Telephone Encounter (Signed)
Received vm message from pt with questions regarding recent CT scan.  Dr. Reviewed lab. He advised that pt follow up with his [facility].  TCT patient and spoke with him. Gave pt Dr. Libby Maw recommendations. Pt states he has seen Dr. Jeffie Pollock in the past. Advised pt call his office to set up an appt to review scan and make recommendations of 23m lesion on kidney. Pt voiced understanding.

## 2022-02-03 ENCOUNTER — Inpatient Hospital Stay: Payer: Medicare Other

## 2022-02-03 ENCOUNTER — Inpatient Hospital Stay (HOSPITAL_BASED_OUTPATIENT_CLINIC_OR_DEPARTMENT_OTHER): Payer: Medicare Other | Admitting: Hematology and Oncology

## 2022-02-03 ENCOUNTER — Other Ambulatory Visit: Payer: Self-pay

## 2022-02-03 VITALS — BP 138/82 | HR 69 | Temp 97.7°F | Resp 18

## 2022-02-03 VITALS — BP 142/73 | HR 91 | Temp 98.2°F | Resp 17 | Ht 68.0 in | Wt 200.0 lb

## 2022-02-03 DIAGNOSIS — C9000 Multiple myeloma not having achieved remission: Secondary | ICD-10-CM

## 2022-02-03 DIAGNOSIS — Z79899 Other long term (current) drug therapy: Secondary | ICD-10-CM | POA: Diagnosis not present

## 2022-02-03 DIAGNOSIS — D539 Nutritional anemia, unspecified: Secondary | ICD-10-CM | POA: Diagnosis not present

## 2022-02-03 DIAGNOSIS — Z5111 Encounter for antineoplastic chemotherapy: Secondary | ICD-10-CM | POA: Diagnosis not present

## 2022-02-03 DIAGNOSIS — Z7982 Long term (current) use of aspirin: Secondary | ICD-10-CM | POA: Diagnosis not present

## 2022-02-03 DIAGNOSIS — L0291 Cutaneous abscess, unspecified: Secondary | ICD-10-CM

## 2022-02-03 LAB — CBC WITH DIFFERENTIAL (CANCER CENTER ONLY)
Abs Immature Granulocytes: 0.01 10*3/uL (ref 0.00–0.07)
Basophils Absolute: 0.1 10*3/uL (ref 0.0–0.1)
Basophils Relative: 2 %
Eosinophils Absolute: 0.3 10*3/uL (ref 0.0–0.5)
Eosinophils Relative: 8 %
HCT: 38.6 % — ABNORMAL LOW (ref 39.0–52.0)
Hemoglobin: 13.5 g/dL (ref 13.0–17.0)
Immature Granulocytes: 0 %
Lymphocytes Relative: 24 %
Lymphs Abs: 0.8 10*3/uL (ref 0.7–4.0)
MCH: 35.8 pg — ABNORMAL HIGH (ref 26.0–34.0)
MCHC: 35 g/dL (ref 30.0–36.0)
MCV: 102.4 fL — ABNORMAL HIGH (ref 80.0–100.0)
Monocytes Absolute: 0.3 10*3/uL (ref 0.1–1.0)
Monocytes Relative: 8 %
Neutro Abs: 2.1 10*3/uL (ref 1.7–7.7)
Neutrophils Relative %: 58 %
Platelet Count: 169 10*3/uL (ref 150–400)
RBC: 3.77 MIL/uL — ABNORMAL LOW (ref 4.22–5.81)
RDW: 15.6 % — ABNORMAL HIGH (ref 11.5–15.5)
WBC Count: 3.6 10*3/uL — ABNORMAL LOW (ref 4.0–10.5)
nRBC: 0 % (ref 0.0–0.2)

## 2022-02-03 LAB — CMP (CANCER CENTER ONLY)
ALT: 35 U/L (ref 0–44)
AST: 24 U/L (ref 15–41)
Albumin: 3.9 g/dL (ref 3.5–5.0)
Alkaline Phosphatase: 78 U/L (ref 38–126)
Anion gap: 6 (ref 5–15)
BUN: 13 mg/dL (ref 8–23)
CO2: 27 mmol/L (ref 22–32)
Calcium: 9.1 mg/dL (ref 8.9–10.3)
Chloride: 108 mmol/L (ref 98–111)
Creatinine: 0.91 mg/dL (ref 0.61–1.24)
GFR, Estimated: >60 mL/min (ref >60)
Glucose, Bld: 124 mg/dL — ABNORMAL HIGH (ref 70–99)
Potassium: 4.1 mmol/L (ref 3.5–5.1)
Sodium: 141 mmol/L (ref 135–145)
Total Bilirubin: 0.6 mg/dL (ref 0.3–1.2)
Total Protein: 6 g/dL — ABNORMAL LOW (ref 6.5–8.1)

## 2022-02-03 LAB — LACTATE DEHYDROGENASE: LDH: 134 U/L (ref 98–192)

## 2022-02-03 MED ORDER — ACETAMINOPHEN 325 MG PO TABS
650.0000 mg | ORAL_TABLET | Freq: Once | ORAL | Status: AC
  Administered 2022-02-03: 650 mg via ORAL
  Filled 2022-02-03: qty 2

## 2022-02-03 MED ORDER — DIPHENHYDRAMINE HCL 25 MG PO CAPS
50.0000 mg | ORAL_CAPSULE | Freq: Once | ORAL | Status: AC
  Administered 2022-02-03: 50 mg via ORAL
  Filled 2022-02-03: qty 2

## 2022-02-03 MED ORDER — DARATUMUMAB-HYALURONIDASE-FIHJ 1800-30000 MG-UT/15ML ~~LOC~~ SOLN
1800.0000 mg | Freq: Once | SUBCUTANEOUS | Status: AC
  Administered 2022-02-03: 1800 mg via SUBCUTANEOUS
  Filled 2022-02-03: qty 15

## 2022-02-07 ENCOUNTER — Encounter: Payer: Self-pay | Admitting: Hematology and Oncology

## 2022-02-08 ENCOUNTER — Ambulatory Visit (INDEPENDENT_AMBULATORY_CARE_PROVIDER_SITE_OTHER): Payer: Medicare Other | Admitting: Podiatry

## 2022-02-08 DIAGNOSIS — G620 Drug-induced polyneuropathy: Secondary | ICD-10-CM | POA: Diagnosis not present

## 2022-02-08 DIAGNOSIS — M79675 Pain in left toe(s): Secondary | ICD-10-CM | POA: Diagnosis not present

## 2022-02-08 DIAGNOSIS — M79674 Pain in right toe(s): Secondary | ICD-10-CM | POA: Diagnosis not present

## 2022-02-08 DIAGNOSIS — L84 Corns and callosities: Secondary | ICD-10-CM | POA: Diagnosis not present

## 2022-02-08 DIAGNOSIS — B351 Tinea unguium: Secondary | ICD-10-CM | POA: Diagnosis not present

## 2022-02-11 ENCOUNTER — Encounter: Payer: Self-pay | Admitting: Podiatry

## 2022-02-11 NOTE — Progress Notes (Signed)
  Subjective:  Patient ID: Wesley [name], male    DOB: 09-28-1940,  MRN: [id number]  Chief Complaint  Patient presents with   Nail Problem    Thick painful toenails, 3 month follow up     81 y.o. male presents with the above complaint. History confirmed with patient. He presents today for painful thickened elongated toenails that he cannot cut.  He also has multiple calluses that are causing pressure and pain.  His wife is a patient of mine and referred him to me.  He is currently undergoing chemotherapy and has noticed burning tingling pain as well as numbness in the feet.  Doing okay no new issues the calluses are thickened and painful and the nails are thick and painful again.  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 Left Foot: dystrophic yellowed discolored nail plates with subungual debris and hyperkeratotic lesion distal tip of third toe Right Foot: dystrophic yellowed discolored nail plates with subungual debris and hyperkeratotic lesion distal tip of great toe  Assessment:   1. Pain due to onychomycosis of toenails of both feet      Plan:  Patient was evaluated and treated and all questions answered.  Discussed the etiology and treatment options for the condition in detail with the patient. Educated patient on the topical and oral treatment options for mycotic nails. Recommended debridement of the nails today. Sharp and mechanical debridement performed of all painful and mycotic nails today. Nails debrided in length and thickness using a nail nipper to level of comfort. Discussed treatment options including appropriate shoe gear. Follow up as needed for painful nails.   All symptomatic hyperkeratoses were safely debrided with a sterile #15 blade to patient's level of  comfort without incident. We discussed preventative and palliative care of these lesions including supportive and accommodative shoegear, padding, prefabricated and custom molded accommodative orthoses, use of a pumice stone and lotions/creams daily.    No follow-ups on file.

## 2022-02-15 ENCOUNTER — Other Ambulatory Visit: Payer: Self-pay | Admitting: *Deleted

## 2022-02-15 DIAGNOSIS — C9000 Multiple myeloma not having achieved remission: Secondary | ICD-10-CM

## 2022-02-15 MED ORDER — LENALIDOMIDE 25 MG PO CAPS: 25.0000 mg | ORAL_CAPSULE | Freq: Every day | ORAL | 0 refills | Status: DC

## 2022-02-17 ENCOUNTER — Inpatient Hospital Stay: Payer: Medicare Other

## 2022-02-17 ENCOUNTER — Inpatient Hospital Stay (HOSPITAL_BASED_OUTPATIENT_CLINIC_OR_DEPARTMENT_OTHER): Payer: Medicare Other | Admitting: Hematology and Oncology

## 2022-02-17 ENCOUNTER — Inpatient Hospital Stay: Payer: Medicare Other | Attending: Hematology and Oncology

## 2022-02-17 ENCOUNTER — Other Ambulatory Visit: Payer: Self-pay

## 2022-02-17 VITALS — BP 150/90 | HR 85 | Temp 97.8°F | Resp 16 | Wt 197.3 lb

## 2022-02-17 DIAGNOSIS — Z5111 Encounter for antineoplastic chemotherapy: Secondary | ICD-10-CM | POA: Insufficient documentation

## 2022-02-17 DIAGNOSIS — C9000 Multiple myeloma not having achieved remission: Secondary | ICD-10-CM | POA: Diagnosis not present

## 2022-02-17 DIAGNOSIS — D72819 Decreased white blood cell count, unspecified: Secondary | ICD-10-CM | POA: Diagnosis not present

## 2022-02-17 DIAGNOSIS — Z79899 Other long term (current) drug therapy: Secondary | ICD-10-CM | POA: Diagnosis not present

## 2022-02-17 LAB — CMP (CANCER CENTER ONLY)
ALT: 32 U/L (ref 0–44)
AST: 23 U/L (ref 15–41)
Albumin: 4.1 g/dL (ref 3.5–5.0)
Alkaline Phosphatase: 100 U/L (ref 38–126)
Anion gap: 6 (ref 5–15)
BUN: 17 mg/dL (ref 8–23)
CO2: 27 mmol/L (ref 22–32)
Calcium: 9.1 mg/dL (ref 8.9–10.3)
Chloride: 107 mmol/L (ref 98–111)
Creatinine: 0.94 mg/dL (ref 0.61–1.24)
GFR, Estimated: >60 mL/min (ref >60)
Glucose, Bld: 122 mg/dL — ABNORMAL HIGH (ref 70–99)
Potassium: 3.9 mmol/L (ref 3.5–5.1)
Sodium: 140 mmol/L (ref 135–145)
Total Bilirubin: 1.1 mg/dL (ref 0.3–1.2)
Total Protein: 6.4 g/dL — ABNORMAL LOW (ref 6.5–8.1)

## 2022-02-17 LAB — CBC WITH DIFFERENTIAL (CANCER CENTER ONLY)
Abs Immature Granulocytes: 0.02 10*3/uL (ref 0.00–0.07)
Basophils Absolute: 0 10*3/uL (ref 0.0–0.1)
Basophils Relative: 1 %
Eosinophils Absolute: 0.1 10*3/uL (ref 0.0–0.5)
Eosinophils Relative: 4 %
HCT: 39.4 % (ref 39.0–52.0)
Hemoglobin: 13.7 g/dL (ref 13.0–17.0)
Immature Granulocytes: 1 %
Lymphocytes Relative: 13 %
Lymphs Abs: 0.5 10*3/uL — ABNORMAL LOW (ref 0.7–4.0)
MCH: 35.9 pg — ABNORMAL HIGH (ref 26.0–34.0)
MCHC: 34.8 g/dL (ref 30.0–36.0)
MCV: 103.1 fL — ABNORMAL HIGH (ref 80.0–100.0)
Monocytes Absolute: 0.3 10*3/uL (ref 0.1–1.0)
Monocytes Relative: 7 %
Neutro Abs: 2.6 10*3/uL (ref 1.7–7.7)
Neutrophils Relative %: 74 %
Platelet Count: 167 10*3/uL (ref 150–400)
RBC: 3.82 MIL/uL — ABNORMAL LOW (ref 4.22–5.81)
RDW: 15.8 % — ABNORMAL HIGH (ref 11.5–15.5)
WBC Count: 3.5 10*3/uL — ABNORMAL LOW (ref 4.0–10.5)
nRBC: 0 % (ref 0.0–0.2)

## 2022-02-17 LAB — LACTATE DEHYDROGENASE: LDH: 150 U/L (ref 98–192)

## 2022-02-17 MED ORDER — DIPHENHYDRAMINE HCL 25 MG PO CAPS
50.0000 mg | ORAL_CAPSULE | Freq: Once | ORAL | Status: AC
  Administered 2022-02-17: 50 mg via ORAL
  Filled 2022-02-17: qty 2

## 2022-02-17 MED ORDER — ACETAMINOPHEN 325 MG PO TABS
650.0000 mg | ORAL_TABLET | Freq: Once | ORAL | Status: AC
  Administered 2022-02-17: 650 mg via ORAL
  Filled 2022-02-17: qty 2

## 2022-02-17 MED ORDER — DARATUMUMAB-HYALURONIDASE-FIHJ 1800-30000 MG-UT/15ML ~~LOC~~ SOLN
1800.0000 mg | Freq: Once | SUBCUTANEOUS | Status: AC
  Administered 2022-02-17: 1800 mg via SUBCUTANEOUS
  Filled 2022-02-17: qty 15

## 2022-02-17 NOTE — Progress Notes (Signed)
Pt states he took dex before his appt today

## 2022-02-17 NOTE — Patient Instructions (Signed)
[facility]  Discharge Instructions: Thank you for choosing [facility] to provide your oncology and hematology care.   If you have a lab appointment with the [facility], please go directly to the [facility] and check in at the registration area.   Wear comfortable clothing and clothing appropriate for easy access to any Portacath or PICC line.   We strive to give you quality time with your provider. You may need to reschedule your appointment if you arrive late (15 or more minutes).  Arriving late affects you and other patients whose appointments are after yours.  Also, if you miss three or more appointments without notifying the office, you may be dismissed from the clinic at the provider's discretion.      For prescription refill requests, have your pharmacy contact our office and allow 72 hours for refills to be completed.    Today you received the following chemotherapy and/or immunotherapy agents: Darzalex Faspro      To help prevent nausea and vomiting after your treatment, we encourage you to take your nausea medication as directed.  BELOW ARE SYMPTOMS THAT SHOULD BE REPORTED IMMEDIATELY: *FEVER GREATER THAN 100.4 F (38 C) OR HIGHER *CHILLS OR SWEATING *NAUSEA AND VOMITING THAT IS NOT CONTROLLED WITH YOUR NAUSEA MEDICATION *UNUSUAL SHORTNESS OF BREATH *UNUSUAL BRUISING OR BLEEDING *URINARY PROBLEMS (pain or burning when urinating, or frequent urination) *BOWEL PROBLEMS (unusual diarrhea, constipation, pain near the anus) TENDERNESS IN MOUTH AND THROAT WITH OR WITHOUT PRESENCE OF ULCERS (sore throat, sores in mouth, or a toothache) UNUSUAL RASH, SWELLING OR PAIN  UNUSUAL VAGINAL DISCHARGE OR ITCHING   Items with * indicate a potential emergency and should be followed up as soon as possible or go to the Emergency Department if any problems should occur.  Please show the CHEMOTHERAPY ALERT CARD or IMMUNOTHERAPY ALERT CARD at  check-in to the Emergency Department and triage nurse.  Should you have questions after your visit or need to cancel or reschedule your appointment, please contact [facility]  Dept: [phone]  and follow the prompts.  Office hours are 8:00 a.m. to 4:30 p.m. Monday - Friday. Please note that voicemails left after 4:00 p.m. may not be returned until the following business day.  We are closed weekends and major holidays. You have access to a nurse at all times for urgent questions. Please call the main number to the clinic Dept: [phone] and follow the prompts.   For any non-urgent questions, you may also contact your provider using MyChart. We now offer e-Visits for anyone 8 and older to request care online for non-urgent symptoms. For details visit [email or web address].   Also download the MyChart app! Go to the app store, search "MyChart", open the app, select [facility], and log in with your MyChart username and password.  Masks are optional in the cancer centers. If you would like for your care team to wear a mask while they are taking care of you, please let them know. For doctor visits, patients may have with them one support person who is at least 80 years old. At this time, visitors are not allowed in the infusion area.

## 2022-02-17 NOTE — Progress Notes (Signed)
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 Further workup showed SPEP M-Spike 4.2 g/dL with IgG-Kappa as monoclonal protein, IgG 5908 mg/dL 09/08/2021: Initial evaluation at [facility]. bone marrow biopsy performed, showed 60% abnormal plasma cells in 60% cellular marrow. 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  09/26/2021: transfer care to Dr. Lorenso Courier at [facility].  09/30/2021: Cycle 1 Day 1 of Dara/Dex 10/06/2021: Cycle 1 Day 8 (added revlimid) 10/28/2021: Cycle 2 Day 1 of Dara/Rev/Dex 11/25/2021: Cycle 3 Day 1 of Dara/Rev/Dex 5/12/023: Cycle 4 Day 1 of Dara/Rev/Dex 01/20/2022: Cycle 5 Day 1 of Dara/Rev/Dex 02/17/2022: Cycle 6 Day 1 of Dara/Rev/Dex  Interval History:  Wesley [name] 81 y.o. male with medical history significant for IgG kappa multiple myeloma who presents for a follow up visit. The patient's last visit was on 02/03/2022. In the interim since the last visit he continued treatment with Dara/Revlimid/dexamethasone.  On exam today Wesley [name] is accompanied by his wife.  He reports he has had no major changes in his health in the interim since her last visit.  He reports that he does still have some occasional tingling in his hands but not as much as usual.  He reports he has been sleeping quite well.  He has developed some increased drainage from the boil on his abdomen.  He is also developed 2 small areas of boils on the  scar of his prior ostomy site.  He reports his appetite has been great.  Overall he is willing and able to proceed with treatment at this time.  He is not having any difficulty with nausea, vomiting, or diarrhea.  He denies fevers, chills, night sweats, shortness of breath, chest pain or cough. He has no other complaints. Full 10 point ROS is listed below.  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 PT denies significant findings   CARDIAC CATHETERIZATION  2008   COLOSTOMY N/A 10/31/2019   Procedure: COLOSTOMY;  Surgeon: CErroll Luna MD;  [facility];  Service: General;  Laterality: N/A;   CYSTOSCOPY W/ URETERAL STENT PLACEMENT Bilateral 10/31/2019   Procedure: CYSTOSCOPY URETERAL STENT PLACEMENT BILATERAL;  Surgeon: Franchot Gallo, MD;  [facility];  Service: Urology;  Laterality: Bilateral;   CYSTOSCOPY WITH STENT PLACEMENT Bilateral 03/11/2020   Procedure: CYSTOSCOPY WITH BILATERAL FIREFLY INJECTION;  Surgeon: Irine Seal, MD;  [facility];  Service: Urology;   Laterality: Bilateral;   EYE SURGERY     BILATERAL CATARACT SURGERY WITH LENS IMPLANTS   FLEXIBLE SIGMOIDOSCOPY N/A 10/29/2019   Procedure: FLEXIBLE SIGMOIDOSCOPY;  Surgeon: Jerene Bears, MD;  [facility];  Service: Gastroenterology;  Laterality: N/A;   INCISIONAL HERNIA REPAIR N/A 10/13/2020   Procedure: OPEN INCISIONAL HERNIA REPAIR WITH MESH;  Surgeon: [name], MD;  Location: [facility];  Service: General;  Laterality: N/A;   INSERTION OF MESH N/A 09/08/2016   Procedure: INSERTION OF MESH;  Surgeon: Jackolyn Confer, MD;  [facility];  Service: General;  Laterality: N/A;   IR RADIOLOGIST EVAL & MGMT  11/10/2020   IR RADIOLOGIST EVAL & MGMT  11/25/2020   LAPAROSCOPIC SIGMOID COLECTOMY N/A 10/31/2019   Procedure: DIAGNOSTIC LAPAROSCOPY; EXPLORATORY LAPAROTOMY; SIGMOID COLECTOMY;  Surgeon: Erroll Luna, MD;  [facility];  Service: General;  Laterality: N/A;   right rotator cuff     SUBMUCOSAL TATTOO INJECTION  10/29/2019   Procedure: SUBMUCOSAL TATTOO INJECTION;  Surgeon: Jerene Bears, MD;  [facility];  Service: Gastroenterology;;   TONSILLECTOMY     UMBILICAL HERNIA REPAIR N/A 09/08/2016   Procedure: OPEN UMBILICAL HERNIA REPAIR WITH MESH;  Surgeon: Jackolyn Confer, MD;  [facility];  Service: General;  Laterality: N/A;   VENTRAL HERNIA REPAIR N/A 02/03/2021   Procedure: LAPAROSCOPIC VENTRAL HERNIA REPAIR WITH MESH;  Surgeon: [name], MD;  Location: [facility];  Service: General;  Laterality: N/A;   XI ROBOTIC ASSISTED COLOSTOMY TAKEDOWN N/A 03/11/2020   Procedure: ROBOTIC ASSISTED COLOSTOMY REVERSAL, RIGID PROCTOSCOPY;  Surgeon: Leighton Ruff, MD;  [facility];  Service: General;  Laterality: N/A;    SOCIAL HISTORY: Social History   Socioeconomic History   Marital status: Married    Spouse name: Ohm Dentler    Number of children: 2   Years of education: 12   Highest education level: Master's degree (e.g., MA, MS, MEng, MEd, MSW, MBA)   Occupational History   Occupation: Retired   Tobacco Use   Smoking status: Never   Smokeless tobacco: Never  Vaping Use   Vaping Use: Never used  Substance and Sexual Activity   Alcohol use: Yes    Alcohol/week: 17.0 standard drinks of alcohol    Types: 3 Glasses of wine, 14 Standard drinks or equivalent per week   Drug use: No   Sexual activity: Not Currently  Other Topics Concern   Not on file  Social History Narrative   Married. 2 children 52 in 52 in 2022- both went to [employer]. 5 grandkids- all girls from oldest 73 looking premed [employer], 95 soph [employer], 67 8 year olds, 84 year olds.       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 [employer]: fishing, [place] football and basketball, [place], [place] Strain: [place]  (05/07/2021)   Overall Financial Resource Strain ([place])    Difficulty  of Paying Living Expenses: Not hard at all  Food Insecurity: No Food Insecurity (05/07/2021)   Hunger Vital Sign    Worried About Running Out of Food in the Last Year: Never true    Ran Out of Food in the Last Year: Never true  Transportation Needs: No Transportation Needs (05/07/2021)   PRAPARE - Hydrologist (Medical): No    Lack of Transportation (Non-Medical): No  Physical Activity: Inactive (05/07/2021)   Exercise Vital Sign    Days of Exercise per Week: 0 days    Minutes of Exercise per Session: 0 min  Stress: No Stress Concern Present (05/07/2021)   [place]    Feeling of Stress : Not at all  Social Connections: [place] (05/07/2021)   Social Connection and Isolation Panel [NHANES]    Frequency of Communication with Friends and Family: More than three times a week    Frequency of Social Gatherings with  Friends and Family: Once a week    Attends Religious Services: More than 4 times per year    Active Member of [employer] or Organizations: Yes    Attends Music therapist: More than 4 times per year    Marital Status: Married  Human resources officer Violence: Not At Risk (05/07/2021)   Humiliation, Afraid, Rape, and Kick questionnaire    Fear of Current or Ex-Partner: No    Emotionally Abused: No    Physically Abused: No    Sexually Abused: No    FAMILY HISTORY: Family History  Problem Relation Age of Onset   Diabetes Mother    Heart failure Mother        around 71   CAD Father        41   Melanoma Brother 43   Healthy Daughter    Healthy Son    Colon cancer Neg Hx    Esophageal cancer Neg Hx    Rectal cancer Neg Hx    Stomach cancer Neg Hx     ALLERGIES:  is allergic to demerol, promethazine hcl, and ativan [lorazepam].  MEDICATIONS:  Current Outpatient Medications  Medication Sig Dispense Refill   acetaminophen (TYLENOL) 500 MG tablet Take 500 mg by mouth every 6 (six) hours as needed.     acyclovir (ZOVIRAX) 400 MG tablet Take 400 mg by mouth in the morning and at bedtime.     allopurinol (ZYLOPRIM) 300 MG tablet TAKE ONE TABLET BY MOUTH DAILY 60 tablet 1   aspirin EC 81 MG tablet Take 81 mg by mouth daily. Swallow whole.     atorvastatin (LIPITOR) 80 MG tablet Take 1 tablet by mouth daily.     Cholecalciferol (VITAMIN D3) 25 MCG (1000 UT) CAPS Take 1,000 Units by mouth daily.     dexamethasone (DECADRON) 4 MG tablet Take 5 tablets (20 mg total) by mouth once a week. 20 tablet 3   fexofenadine (ALLEGRA) 180 MG tablet      finasteride (PROSCAR) 5 MG tablet Take 5 mg by mouth daily.     fluticasone (FLONASE) 50 MCG/ACT nasal spray Place into the nose.     Ibuprofen 200 MG CAPS      lenalidomide (REVLIMID) 25 MG capsule Take 1 capsule (25 mg total) by mouth daily. Take for 21 days, none for 7 days. Repeat every 28 days. [employer] Auth #  [id number] obtained 02/15/22 21  capsule 0   lisinopril (ZESTRIL) 10 MG tablet Take 1 tablet (  10 mg total) by mouth daily. 90 tablet 3   Magnesium Oxide 400 MG CAPS      melatonin 5 MG TABS      Omega-3 Fatty Acids (FISH OIL) 1200 MG CAPS Take 1,200 mg by mouth daily.     ondansetron (ZOFRAN) 8 MG tablet Take 1 tablet (8 mg total) by mouth every 8 (eight) hours as needed. 30 tablet 0   pantoprazole (PROTONIX) 40 MG tablet Take by mouth.     prochlorperazine (COMPAZINE) 10 MG tablet Take 1 tablet (10 mg total) by mouth every 6 (six) hours as needed for nausea or vomiting. 30 tablet 0   vitamin B-12 (CYANOCOBALAMIN) 1000 MCG tablet Take 1,000 mcg by mouth daily.     No current facility-administered medications for this visit.    REVIEW OF SYSTEMS:   Constitutional: ( - ) fevers, ( - )  chills , ( - ) night sweats Eyes: ( - ) blurriness of vision, ( - ) double vision, ( - ) watery eyes Ears, nose, mouth, throat, and face: ( - ) mucositis, ( - ) sore throat Respiratory: ( - ) cough, ( - ) dyspnea, ( - ) wheezes Cardiovascular: ( - ) palpitation, ( - ) chest discomfort, ( - ) lower extremity swelling Gastrointestinal:  ( - ) nausea, ( - ) heartburn, ( - ) change in bowel habits Skin: ( - ) abnormal skin rashes Lymphatics: ( - ) new lymphadenopathy, ( - ) easy bruising Neurological: ( + ) numbness, ( - ) tingling, ( - ) new weaknesses Behavioral/Psych: ( - ) mood change, ( - ) new changes  All other systems were reviewed with the patient and are negative.  PHYSICAL EXAMINATION: ECOG PERFORMANCE STATUS: 1 - Symptomatic but completely ambulatory  Vitals:   02/17/22 1058  BP: (!) 150/90  Pulse: 85  Resp: 16  Temp: 97.8 F (36.6 C)  SpO2: 96%    Filed Weights   02/17/22 1058  Weight: 197 lb 4.8 oz (89.5 kg)     GENERAL: Well-appearing elderly Caucasian male, alert, no distress and comfortable SKIN: skin color, texture, turgor are normal, no rashes or significant lesions EYES: conjunctiva are pink and  non-injected, sclera clear LUNGS: clear to auscultation and percussion with normal breathing effort HEART: regular rate & rhythm and no murmurs and no lower extremity edema ABDOMEN: 3 small boils, one in the center of his abdomen and 2 at the site of his prior ostomy.  Musculoskeletal: no cyanosis of digits and no clubbing  PSYCH: alert & oriented x 3, fluent speech NEURO: no focal motor/sensory deficits  LABORATORY DATA:  I have reviewed the data as listed    Latest Ref Rng & Units 02/17/2022   10:19 AM 02/03/2022    9:33 AM 01/20/2022    9:35 AM  CBC  WBC 4.0 - 10.5 K/uL 3.5  3.6  4.8   Hemoglobin 13.0 - 17.0 g/dL 13.7  13.5  13.7   Hematocrit 39.0 - 52.0 % 39.4  38.6  40.0   Platelets 150 - 400 K/uL 167  169  134        Latest Ref Rng & Units 02/17/2022   10:19 AM 02/03/2022    9:33 AM 01/20/2022    9:35 AM  CMP  Glucose 70 - 99 mg/dL 122  124  139   BUN 8 - 23 mg/dL '17  13  16   ' Creatinine 0.61 - 1.24 mg/dL 0.94  0.91  0.92  Sodium 135 - 145 mmol/L 140  141  140   Potassium 3.5 - 5.1 mmol/L 3.9  4.1  4.3   Chloride 98 - 111 mmol/L 107  108  106   CO2 22 - 32 mmol/L '27  27  27   ' Calcium 8.9 - 10.3 mg/dL 9.1  9.1  9.6   Total Protein 6.5 - 8.1 g/dL 6.4  6.0  6.4   Total Bilirubin 0.3 - 1.2 mg/dL 1.1  0.6  0.7   Alkaline Phos 38 - 126 U/L 100  78  95   AST 15 - 41 U/L '23  24  19   ' ALT 0 - 44 U/L 32  35  25     Lab Results  Component Value Date   MPROTEIN 0.2 (H) 01/20/2022   MPROTEIN 0.3 (H) 01/06/2022   MPROTEIN 0.4 (H) 12/09/2021   Lab Results  Component Value Date   KPAFRELGTCHN 10.8 02/17/2022   KPAFRELGTCHN 11.5 01/20/2022   KPAFRELGTCHN 9.5 01/06/2022   LAMBDASER 5.9 02/17/2022   LAMBDASER 5.7 01/20/2022   LAMBDASER 4.1 (L) 01/06/2022   KAPLAMBRATIO 1.83 (H) 02/17/2022   KAPLAMBRATIO 2.02 (H) 01/20/2022   KAPLAMBRATIO 2.32 (H) 01/06/2022    RADIOGRAPHIC STUDIES: CT ABDOMEN PELVIS W CONTRAST  Result Date: 01/31/2022 CLINICAL DATA:  Cellulitis abdominal  wall EXAM: CT ABDOMEN AND PELVIS WITH CONTRAST TECHNIQUE: Multidetector CT imaging of the abdomen and pelvis was performed using the standard protocol following bolus administration of intravenous contrast. RADIATION DOSE REDUCTION: This exam was performed according to the departmental dose-optimization program which includes automated exposure control, adjustment of the mA and/or kV according to patient size and/or use of iterative reconstruction technique. CONTRAST:  117m OMNIPAQUE IOHEXOL 300 MG/ML  SOLN COMPARISON:  01/14/2021 FINDINGS: Lower chest: Minimal increase in interstitial markings in the periphery of lower lung fields may suggest scarring. Hepatobiliary: There is fatty infiltration in the liver. There is no dilation of bile ducts. There are calcified gallbladder stones. Pancreas: No focal abnormality is seen. Spleen: Unremarkable. Adrenals/Urinary Tract: Adrenals are unremarkable. There is no hydronephrosis. There are few tiny bilateral renal stones. There is 3 cm cyst in the lower pole of right kidney. There are other possible subcentimeter right renal cysts. In the image 25 of series 2 there is 9 mm smooth marginated exophytic lesion in the lateral margin of upper pole of left kidney with density measurements higher than usual for simple renal cyst. Ureters are not dilated. Urinary bladder is unremarkable. Stomach/Bowel: Stomach is not distended. Small bowel loops are not dilated. Appendix is not dilated. Scattered diverticula are seen in colon without signs of focal acute diverticulitis. Surgical staples are seen in the sigmoid colon. Vascular/Lymphatic: There are scattered arterial calcifications. Reproductive: Prostate is enlarged. Other: There is no ascites or pneumoperitoneum. There is subcutaneous stranding in the anterior abdominal wall at the site of previous colostomy in the left lower abdomen and adjacent to the umbilicus. Similar finding was seen in the previous examination. No new  loculated fluid collection is seen in the anterior abdominal wall. There is diastasis between the rectus muscles at the level of umbilicus. Small bilateral inguinal hernias containing fat are seen. Musculoskeletal: Degenerative changes are noted in the lumbar spine, particularly severe at L4-L5 and L5-S1 levels. There is minimal anterolisthesis at L4-L5 level. IMPRESSION: There is no evidence intestinal obstruction or pneumoperitoneum. There is no hydronephrosis. Appendix is not dilated. There is 9 mm exophytic lesion in the upper pole of left kidney with density measurements  higher than usual for simple cyst. This may suggest hyperdense hemorrhagic cyst or solid lesion such as neoplasm. Follow-up renal sonogram or multiphasic CT may be considered. Gallbladder stones. Multiple small bilateral renal stones. Diverticulosis of colon without signs of diverticulitis. There is subcutaneous stranding in the anterior abdominal wall possibly related to previous surgery. There is no loculated fluid collection in the anterior abdominal wall. Lumbar spondylosis. Other findings as described in the body of the report. Electronically Signed   By: Elmer Picker M.D.   On: 01/31/2022 14:31    ASSESSMENT & PLAN  Wesley [name] 81 y.o. male with medical history significant for IgG kappa multiple myeloma who presents for a follow up visit.   The patient meets diagnostic criteria by having greater than 60% plasma cells in his bone marrow.  At this time we are in agreement with [facility] in [place], Revlimid, and dexamethasone as the treatment of choice.   Previously we discussed the concept of comanaged care.  Comanaged care is when the patient has a local primary provider who administers local support and therapy while expert advice and treatment recommendations are rendered by a cancer specialist at a large academic center.  In this arrangement we provide local support, labs, treatment, and  emergency visits, however the major decisions regarding the course of treatment are decided by a physician at an academic medical center.  The patient voiced his understanding of comanaged care and was agreeable to proceeding forward with this care model.   The regimen of choice for this patient will be Dara, Revlimid, and dexamethasone.  We will start with weekly treatments for the first 2 cycles.  After that for an additional 4 cycles he will be getting it every other week.  After cycle 6 the patient will then continue with monthly treatment.    # IgG Kappa Multiple Myeloma --Patient meets diagnostic criteria for multiple myeloma based on the presence of 60% plasma cells in the bone marrow --Pretreatment phenotype RBC as well as type and screen ordered  --Cycle 1 Day 1of Dara/Rev/Dex started on 09/30/2021. He started without revlimid which was added on 10/06/2021 (Cycle 1 Day 8)  -- Labs to include weekly CBC, CMP, LDH.  Additionally monthly SPEP and serum free light chains. Plan:  --Today is Cycle 6 Day 1 of Dara/Rev/Dex --Labs today were reviewed and adequate for treatment.  -- Return to clinic in 2 weeks prior to Cycle 6 Day 15 of Dara/Rev/Dex  # Boils on Abdomen -- Patient underwent I&D and subsequent antibiotic treatment with no resolution.  2 new lesions have developed at the site of his prior ostomy --We will reach out to surgery to see if they would be able to evaluate these lesions. --No systemic symptoms, okay to continue with systemic chemotherapy.  #Macrocytic anemia: #Leukopenia # Thrombocytopenia --WBC 3.5, Hgb is 13.7, MCV 103.1, Plt 167 --Levels fluctuating with treatment.   #Supportive Care -- chemotherapy education complete  -- port placement not required.  --ASA 81 mg p.o. daily for thromboprophylaxis on Revlimid. -- zofran 55m q8H PRN and compazine 163mPO q6H for nausea -- acyclovir 40074mO BID for VCZ prophylaxis -- allopurinol 300m31m daily for TLS  prophylaxis -- zometa clearance obtained, started on 12/09/2021. Next dose in July 2023.  -- no pain medication required at this time.   No orders of the defined types were placed in this encounter.  All questions were answered. The patient knows to call the clinic with any  problems, questions or concerns.  I have spent a total of 30 minutes minutes of face-to-face and non-face-to-face time, preparing to see the patient, performing a medically appropriate examination, counseling and educating the patient, ordering medications/tests, documenting clinical information in the electronic health record,  and care coordination.   Ledell Peoples, MD Department of Hematology/Oncology [place] at [facility] Phone: [phone] Pager: [phone] Email: [email or web address]  02/21/2022 9:50 AM

## 2022-02-20 LAB — KAPPA/LAMBDA LIGHT CHAINS
Kappa free light chain: 10.8 mg/L (ref 3.3–19.4)
Kappa, lambda light chain ratio: 1.83 — ABNORMAL HIGH (ref 0.26–1.65)
Lambda free light chains: 5.9 mg/L (ref 5.7–26.3)

## 2022-02-21 ENCOUNTER — Encounter: Payer: Self-pay | Admitting: Hematology and Oncology

## 2022-02-21 LAB — MULTIPLE MYELOMA PANEL, SERUM
Albumin SerPl Elph-Mcnc: 3.5 g/dL (ref 2.9–4.4)
Albumin/Glob SerPl: 1.5 (ref 0.7–1.7)
Alpha 1: 0.2 g/dL (ref 0.0–0.4)
Alpha2 Glob SerPl Elph-Mcnc: 0.7 g/dL (ref 0.4–1.0)
B-Globulin SerPl Elph-Mcnc: 0.9 g/dL (ref 0.7–1.3)
Gamma Glob SerPl Elph-Mcnc: 0.5 g/dL (ref 0.4–1.8)
Globulin, Total: 2.4 g/dL (ref 2.2–3.9)
IgA: 59 mg/dL — ABNORMAL LOW (ref 61–437)
IgG (Immunoglobin G), Serum: 480 mg/dL — ABNORMAL LOW (ref 603–1613)
IgM (Immunoglobulin M), Srm: 25 mg/dL (ref 15–143)
M Protein SerPl Elph-Mcnc: 0.2 g/dL — ABNORMAL HIGH
Total Protein ELP: 5.9 g/dL — ABNORMAL LOW (ref 6.0–8.5)

## 2022-02-24 ENCOUNTER — Inpatient Hospital Stay: Payer: Medicare Other

## 2022-02-24 DIAGNOSIS — L02211 Cutaneous abscess of abdominal wall: Secondary | ICD-10-CM | POA: Diagnosis not present

## 2022-02-24 DIAGNOSIS — C9000 Multiple myeloma not having achieved remission: Secondary | ICD-10-CM | POA: Diagnosis not present

## 2022-02-24 DIAGNOSIS — Z9889 Other specified postprocedural states: Secondary | ICD-10-CM | POA: Diagnosis not present

## 2022-02-24 DIAGNOSIS — Z8719 Personal history of other diseases of the digestive system: Secondary | ICD-10-CM | POA: Diagnosis not present

## 2022-02-24 DIAGNOSIS — N281 Cyst of kidney, acquired: Secondary | ICD-10-CM | POA: Diagnosis not present

## 2022-02-24 DIAGNOSIS — L24A9 Irritant contact dermatitis due friction or contact with other specified body fluids: Secondary | ICD-10-CM | POA: Diagnosis not present

## 2022-02-24 DIAGNOSIS — N403 Nodular prostate with lower urinary tract symptoms: Secondary | ICD-10-CM | POA: Diagnosis not present

## 2022-02-24 DIAGNOSIS — R351 Nocturia: Secondary | ICD-10-CM | POA: Diagnosis not present

## 2022-02-28 DIAGNOSIS — L923 Foreign body granuloma of the skin and subcutaneous tissue: Secondary | ICD-10-CM | POA: Diagnosis not present

## 2022-03-03 ENCOUNTER — Other Ambulatory Visit: Payer: Self-pay

## 2022-03-03 ENCOUNTER — Inpatient Hospital Stay: Payer: Medicare Other

## 2022-03-03 ENCOUNTER — Inpatient Hospital Stay: Payer: Medicare Other | Admitting: Hematology and Oncology

## 2022-03-03 ENCOUNTER — Inpatient Hospital Stay (HOSPITAL_BASED_OUTPATIENT_CLINIC_OR_DEPARTMENT_OTHER): Payer: Medicare Other | Admitting: Physician Assistant

## 2022-03-03 ENCOUNTER — Other Ambulatory Visit: Payer: Self-pay | Admitting: Hematology and Oncology

## 2022-03-03 VITALS — BP 134/84 | HR 75 | Temp 97.9°F | Resp 15 | Wt 200.0 lb

## 2022-03-03 DIAGNOSIS — C9000 Multiple myeloma not having achieved remission: Secondary | ICD-10-CM

## 2022-03-03 DIAGNOSIS — D72819 Decreased white blood cell count, unspecified: Secondary | ICD-10-CM | POA: Diagnosis not present

## 2022-03-03 DIAGNOSIS — Z5111 Encounter for antineoplastic chemotherapy: Secondary | ICD-10-CM | POA: Diagnosis not present

## 2022-03-03 DIAGNOSIS — Z79899 Other long term (current) drug therapy: Secondary | ICD-10-CM | POA: Diagnosis not present

## 2022-03-03 LAB — CMP (CANCER CENTER ONLY)
ALT: 29 U/L (ref 0–44)
AST: 23 U/L (ref 15–41)
Albumin: 4.1 g/dL (ref 3.5–5.0)
Alkaline Phosphatase: 78 U/L (ref 38–126)
Anion gap: 7 (ref 5–15)
BUN: 17 mg/dL (ref 8–23)
CO2: 24 mmol/L (ref 22–32)
Calcium: 8.9 mg/dL (ref 8.9–10.3)
Chloride: 107 mmol/L (ref 98–111)
Creatinine: 0.9 mg/dL (ref 0.61–1.24)
GFR, Estimated: >60 mL/min (ref >60)
Glucose, Bld: 117 mg/dL — ABNORMAL HIGH (ref 70–99)
Potassium: 4.5 mmol/L (ref 3.5–5.1)
Sodium: 138 mmol/L (ref 135–145)
Total Bilirubin: 0.9 mg/dL (ref 0.3–1.2)
Total Protein: 6.1 g/dL — ABNORMAL LOW (ref 6.5–8.1)

## 2022-03-03 LAB — CBC WITH DIFFERENTIAL (CANCER CENTER ONLY)
Abs Immature Granulocytes: 0.05 10*3/uL (ref 0.00–0.07)
Basophils Absolute: 0.1 10*3/uL (ref 0.0–0.1)
Basophils Relative: 2 %
Eosinophils Absolute: 0.1 10*3/uL (ref 0.0–0.5)
Eosinophils Relative: 1 %
HCT: 40 % (ref 39.0–52.0)
Hemoglobin: 14 g/dL (ref 13.0–17.0)
Immature Granulocytes: 1 %
Lymphocytes Relative: 9 %
Lymphs Abs: 0.5 10*3/uL — ABNORMAL LOW (ref 0.7–4.0)
MCH: 36.2 pg — ABNORMAL HIGH (ref 26.0–34.0)
MCHC: 35 g/dL (ref 30.0–36.0)
MCV: 103.4 fL — ABNORMAL HIGH (ref 80.0–100.0)
Monocytes Absolute: 0.3 10*3/uL (ref 0.1–1.0)
Monocytes Relative: 5 %
Neutro Abs: 4.7 10*3/uL (ref 1.7–7.7)
Neutrophils Relative %: 82 %
Platelet Count: 220 10*3/uL (ref 150–400)
RBC: 3.87 MIL/uL — ABNORMAL LOW (ref 4.22–5.81)
RDW: 15.9 % — ABNORMAL HIGH (ref 11.5–15.5)
WBC Count: 5.7 10*3/uL (ref 4.0–10.5)
nRBC: 0 % (ref 0.0–0.2)

## 2022-03-03 LAB — LACTATE DEHYDROGENASE: LDH: 151 U/L (ref 98–192)

## 2022-03-03 MED ORDER — ACETAMINOPHEN 325 MG PO TABS
650.0000 mg | ORAL_TABLET | Freq: Once | ORAL | Status: AC
  Administered 2022-03-03: 650 mg via ORAL
  Filled 2022-03-03: qty 2

## 2022-03-03 MED ORDER — DIPHENHYDRAMINE HCL 25 MG PO CAPS
50.0000 mg | ORAL_CAPSULE | Freq: Once | ORAL | Status: AC
  Administered 2022-03-03: 50 mg via ORAL

## 2022-03-03 MED ORDER — DARATUMUMAB-HYALURONIDASE-FIHJ 1800-30000 MG-UT/15ML ~~LOC~~ SOLN
1800.0000 mg | Freq: Once | SUBCUTANEOUS | Status: AC
  Administered 2022-03-03: 1800 mg via SUBCUTANEOUS
  Filled 2022-03-03: qty 15

## 2022-03-03 MED ORDER — ZOLEDRONIC ACID 4 MG/100ML IV SOLN
4.0000 mg | Freq: Once | INTRAVENOUS | Status: AC
  Administered 2022-03-03: 4 mg via INTRAVENOUS
  Filled 2022-03-03: qty 100

## 2022-03-03 MED ORDER — SODIUM CHLORIDE 0.9 % IV SOLN: Freq: Once | INTRAVENOUS | Status: AC

## 2022-03-03 NOTE — Progress Notes (Signed)
Patient states that he took his dex at home prior to appointment.

## 2022-03-03 NOTE — Patient Instructions (Addendum)
[facility]  Discharge Instructions: Thank you for choosing [facility] to provide your oncology and hematology care.   If you have a lab appointment with the [facility], please go directly to the [facility] and check in at the registration area.   Wear comfortable clothing and clothing appropriate for easy access to any Portacath or PICC line.   We strive to give you quality time with your provider. You may need to reschedule your appointment if you arrive late (15 or more minutes).  Arriving late affects you and other patients whose appointments are after yours.  Also, if you miss three or more appointments without notifying the office, you may be dismissed from the clinic at the provider's discretion.      For prescription refill requests, have your pharmacy contact our office and allow 72 hours for refills to be completed.    Today you received the following chemotherapy and/or immunotherapy agents: daratumumab hyaluronidase, zoledronic acid   To help prevent nausea and vomiting after your treatment, we encourage you to take your nausea medication as directed.  BELOW ARE SYMPTOMS THAT SHOULD BE REPORTED IMMEDIATELY: *FEVER GREATER THAN 100.4 F (38 C) OR HIGHER *CHILLS OR SWEATING *NAUSEA AND VOMITING THAT IS NOT CONTROLLED WITH YOUR NAUSEA MEDICATION *UNUSUAL SHORTNESS OF BREATH *UNUSUAL BRUISING OR BLEEDING *URINARY PROBLEMS (pain or burning when urinating, or frequent urination) *BOWEL PROBLEMS (unusual diarrhea, constipation, pain near the anus) TENDERNESS IN MOUTH AND THROAT WITH OR WITHOUT PRESENCE OF ULCERS (sore throat, sores in mouth, or a toothache) UNUSUAL RASH, SWELLING OR PAIN  UNUSUAL VAGINAL DISCHARGE OR ITCHING   Items with * indicate a potential emergency and should be followed up as soon as possible or go to the Emergency Department if any problems should occur.  Please show the CHEMOTHERAPY ALERT CARD or  IMMUNOTHERAPY ALERT CARD at check-in to the Emergency Department and triage nurse.  Should you have questions after your visit or need to cancel or reschedule your appointment, please contact [facility]  Dept: [phone]  and follow the prompts.  Office hours are 8:00 a.m. to 4:30 p.m. Monday - Friday. Please note that voicemails left after 4:00 p.m. may not be returned until the following business day.  We are closed weekends and major holidays. You have access to a nurse at all times for urgent questions. Please call the main number to the clinic Dept: [phone] and follow the prompts.   For any non-urgent questions, you may also contact your provider using MyChart. We now offer e-Visits for anyone 19 and older to request care online for non-urgent symptoms. For details visit [email or web address].   Also download the MyChart app! Go to the app store, search "MyChart", open the app, select [facility], and log in with your MyChart username and password.  Masks are optional in the cancer centers. If you would like for your care team to wear a mask while they are taking care of you, please let them know. For doctor visits, patients may have with them one support person who is at least 80 years old. At this time, visitors are not allowed in the infusion area.

## 2022-03-06 ENCOUNTER — Encounter: Payer: Self-pay | Admitting: Hematology and Oncology

## 2022-03-06 ENCOUNTER — Other Ambulatory Visit: Payer: Self-pay

## 2022-03-06 LAB — IFE, DARA-SPECIFIC, SERUM
IgA: 60 mg/dL — ABNORMAL LOW (ref 61–437)
IgG (Immunoglobin G), Serum: 504 mg/dL — ABNORMAL LOW (ref 603–1613)
IgM (Immunoglobulin M), Srm: 25 mg/dL (ref 15–143)

## 2022-03-06 NOTE — Progress Notes (Signed)
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 Further workup showed SPEP M-Spike 4.2 g/dL with IgG-Kappa as monoclonal protein, IgG 5908 mg/dL 09/08/2021: Initial evaluation at [facility]. bone marrow biopsy performed, showed 60% abnormal plasma cells in 60% cellular marrow. 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  09/26/2021: transfer care to Dr. Lorenso Courier at [facility].  09/30/2021: Cycle 1 Day 1 of Dara/Dex 10/06/2021: Cycle 1 Day 8 (added revlimid) 10/28/2021: Cycle 2 Day 1 of Dara/Rev/Dex 11/25/2021: Cycle 3 Day 1 of Dara/Rev/Dex 5/12/023: Cycle 4 Day 1 of Dara/Rev/Dex 01/20/2022: Cycle 5 Day 1 of Dara/Rev/Dex 02/17/2022: Cycle 6 Day 1 of Dara/Rev/Dex  Interval History:  Wesley [name] 81 y.o. male with medical history significant for IgG kappa multiple myeloma who presents for a follow up visit. The patient's last visit was on 02/17/2022. In the interim since the last visit he continued treatment with Dara/Revlimid/dexamethasone.  On exam today Wesley [name] is accompanied by his wife.  He continues to do well and reports that his energy levels have improved which he is able to do chores around the house. He has a good appetite and denies any weight changes. He denies nausea, vomiting or abdominal pain. He was seen by surgeon last week and was taught how to pack the abdominal wounds at home which his wife has continued to do. He denies any  bowel habit changes including recurrent episodes of diarrhea or constipation. He denies easy bruising or signs of bleeding.   He denies fevers, chills, night sweats, shortness of breath, chest pain or cough. He has no other complaints. Full 10 point ROS is listed below.  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)    CAD (coronary artery disease)    Cholelithiasis    Chronic allergic rhinitis    Coronary atherosclerosis of native coronary artery    Fatty liver    GERD (gastroesophageal reflux disease)    HX 75yr ago, no longer a problem, no meds   High blood pressure    Hypercholesterolemia    Macrocytosis without anemia    [place] 09/26/[place]   OSA on CPAP    Paraesophageal hernia    Partial bowel obstruction (HCC)    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 PT denies significant findings   CARDIAC CATHETERIZATION  2008   COLOSTOMY N/A 10/31/2019   Procedure: COLOSTOMY;  Surgeon: CErroll Luna MD;  Location: [facility];  Service: General;  Laterality: N/A;   CYSTOSCOPY W/ URETERAL STENT PLACEMENT Bilateral 10/31/2019   Procedure: CYSTOSCOPY URETERAL STENT PLACEMENT BILATERAL;  Surgeon: Franchot Gallo, MD;  Location: [facility];  Service: Urology;  Laterality: Bilateral;   CYSTOSCOPY WITH STENT PLACEMENT Bilateral 03/11/2020   Procedure: CYSTOSCOPY WITH BILATERAL FIREFLY INJECTION;  Surgeon: Irine Seal, MD;  Location: [facility];  Service: Urology;  Laterality: Bilateral;   EYE SURGERY     BILATERAL CATARACT SURGERY WITH LENS  IMPLANTS   FLEXIBLE SIGMOIDOSCOPY N/A 10/29/2019   Procedure: FLEXIBLE SIGMOIDOSCOPY;  Surgeon: Jerene Bears, MD;  [facility]: [facility];  Service: Gastroenterology;  Laterality: N/A;   INCISIONAL HERNIA REPAIR N/A 10/13/2020   Procedure: OPEN INCISIONAL HERNIA REPAIR WITH MESH;  Surgeon: [name], MD;  Location: [place];  Service: General;  Laterality: N/A;   INSERTION OF MESH N/A 09/08/2016   Procedure: INSERTION OF MESH;  Surgeon: Jackolyn Confer, MD;  Location: [facility];  Service: General;  Laterality: N/A;   IR RADIOLOGIST EVAL & MGMT  11/10/2020   IR RADIOLOGIST EVAL & MGMT  11/25/2020   LAPAROSCOPIC SIGMOID COLECTOMY N/A 10/31/2019   Procedure: DIAGNOSTIC LAPAROSCOPY; EXPLORATORY LAPAROTOMY; SIGMOID COLECTOMY;  Surgeon: Erroll Luna, MD;  Location: [facility];  Service: General;  Laterality: N/A;   right rotator cuff     SUBMUCOSAL TATTOO INJECTION  10/29/2019   Procedure: SUBMUCOSAL TATTOO INJECTION;  Surgeon: Jerene Bears, MD;  [facility];  Service: Gastroenterology;;   TONSILLECTOMY     UMBILICAL HERNIA REPAIR N/A 09/08/2016   Procedure: OPEN UMBILICAL HERNIA REPAIR WITH MESH;  Surgeon: Jackolyn Confer, MD;  [facility];  Service: General;  Laterality: N/A;   VENTRAL HERNIA REPAIR N/A 02/03/2021   Procedure: LAPAROSCOPIC VENTRAL HERNIA REPAIR WITH MESH;  Surgeon: [name], MD;  Location: [place];  Service: General;  Laterality: N/A;   XI ROBOTIC ASSISTED COLOSTOMY TAKEDOWN N/A 03/11/2020   Procedure: ROBOTIC ASSISTED COLOSTOMY REVERSAL, RIGID PROCTOSCOPY;  Surgeon: Leighton Ruff, MD;  Location: [facility];  Service: General;  Laterality: N/A;    SOCIAL HISTORY: Social History   Socioeconomic History   Marital status: Married    Spouse name: Nasri Boakye    Number of children: 2   Years of education: 12   Highest education level: Master's degree (e.g., MA, MS, MEng, MEd, MSW, MBA)  Occupational History   Occupation: Retired   Tobacco Use   Smoking status:  Never   Smokeless tobacco: Never  Vaping Use   Vaping Use: Never used  Substance and Sexual Activity   Alcohol use: Yes    Alcohol/week: 17.0 standard drinks of alcohol    Types: 3 Glasses of wine, 14 Standard drinks or equivalent per week   Drug use: No   Sexual activity: Not Currently  Other Topics Concern   Not on file  Social History Narrative   Married. 2 children 52 in 53 in 2022- both went to [employer] [employer] [facility] [employer] [facility]. 5 grandkids- all girls from oldest 78 looking premed [employer], 14 soph [place], 70 3 year olds, 38 year olds.       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 [employer]: fishing, [place] football and basketball, [place], [place] Strain: [place]  (05/07/2021)   Overall Financial Resource Strain ([place])    Difficulty of Paying Living Expenses: Not hard at [place]: No [place] (05/07/2021)   Hunger Vital Sign    Worried About  Running Out of Food in the Last Year: Never true    [place] in the Last Year: Never true  Transportation Needs: No Transportation Needs (05/07/2021)   PRAPARE - Hydrologist (Medical): No    Lack of Transportation (Non-Medical): No  Physical Activity: Inactive (05/07/2021)   Exercise Vital Sign    Days of Exercise per Week: 0 days    Minutes of Exercise per Session: 0 min  Stress: No Stress Concern Present (05/07/2021)   [place]    Feeling of Stress : Not at all  Social Connections: [place] (05/07/2021)   Social Connection and Isolation Panel [NHANES]    Frequency of Communication with Friends and Family: More than three times a week    Frequency of Social Gatherings with Friends and Family: Once a week    Attends Religious Services: More than 4 times  per year    Active Member of [employer] or Organizations: Yes    Attends Music therapist: More than 4 times per year    Marital Status: Married  Human resources officer Violence: Not At Risk (05/07/2021)   Humiliation, Afraid, Rape, and Kick questionnaire    Fear of Current or Ex-Partner: No    Emotionally Abused: No    Physically Abused: No    Sexually Abused: No    FAMILY HISTORY: Family History  Problem Relation Age of Onset   Diabetes Mother    Heart failure Mother        around 37   CAD Father        34   Melanoma Brother 67   Healthy Daughter    Healthy Son    Colon cancer Neg Hx    Esophageal cancer Neg Hx    Rectal cancer Neg Hx    Stomach cancer Neg Hx     ALLERGIES:  is allergic to demerol, promethazine hcl, and ativan [lorazepam].  MEDICATIONS:  Current Outpatient Medications  Medication Sig Dispense Refill   acetaminophen (TYLENOL) 500 MG tablet Take 500 mg by mouth every 6 (six) hours as needed.     acyclovir (ZOVIRAX) 400 MG tablet Take 400 mg by mouth in the morning and at bedtime.     allopurinol (ZYLOPRIM) 300 MG tablet TAKE ONE TABLET BY MOUTH DAILY 60 tablet 1   aspirin EC 81 MG tablet Take 81 mg by mouth daily. Swallow whole.     atorvastatin (LIPITOR) 80 MG tablet Take 1 tablet by mouth daily.     Cholecalciferol (VITAMIN D3) 25 MCG (1000 UT) CAPS Take 1,000 Units by mouth daily.     dexamethasone (DECADRON) 4 MG tablet Take 5 tablets (20 mg total) by mouth once a week. 20 tablet 3   fexofenadine (ALLEGRA) 180 MG tablet      finasteride (PROSCAR) 5 MG tablet Take 5 mg by mouth daily.     fluticasone (FLONASE) 50 MCG/ACT nasal spray Place into the nose.     Ibuprofen 200 MG CAPS      lenalidomide (REVLIMID) 25 MG capsule Take 1 capsule (25 mg total) by mouth daily. Take for 21 days, none for 7 days. Repeat every 28 days. [employer] Auth #  [id number] obtained 02/15/22 21 capsule 0   lisinopril (ZESTRIL) 10 MG tablet Take 1 tablet (10 mg total) by mouth  daily. 90 tablet 3   Magnesium Oxide 400 MG CAPS      melatonin 5 MG TABS  Omega-3 Fatty Acids (FISH OIL) 1200 MG CAPS Take 1,200 mg by mouth daily.     ondansetron (ZOFRAN) 8 MG tablet Take 1 tablet (8 mg total) by mouth every 8 (eight) hours as needed. 30 tablet 0   pantoprazole (PROTONIX) 40 MG tablet Take by mouth.     prochlorperazine (COMPAZINE) 10 MG tablet Take 1 tablet (10 mg total) by mouth every 6 (six) hours as needed for nausea or vomiting. 30 tablet 0   vitamin B-12 (CYANOCOBALAMIN) 1000 MCG tablet Take 1,000 mcg by mouth daily.     No current facility-administered medications for this visit.    REVIEW OF SYSTEMS:   Constitutional: ( - ) fevers, ( - )  chills , ( - ) night sweats Eyes: ( - ) blurriness of vision, ( - ) double vision, ( - ) watery eyes Ears, nose, mouth, throat, and face: ( - ) mucositis, ( - ) sore throat Respiratory: ( - ) cough, ( - ) dyspnea, ( - ) wheezes Cardiovascular: ( - ) palpitation, ( - ) chest discomfort, ( - ) lower extremity swelling Gastrointestinal:  ( - ) nausea, ( - ) heartburn, ( - ) change in bowel habits Skin: ( - ) abnormal skin rashes Lymphatics: ( - ) new lymphadenopathy, ( - ) easy bruising Neurological: ( + ) numbness, ( - ) tingling, ( - ) new weaknesses Behavioral/Psych: ( - ) mood change, ( - ) new changes  All other systems were reviewed with the patient and are negative.  PHYSICAL EXAMINATION: ECOG PERFORMANCE STATUS: 1 - Symptomatic but completely ambulatory  Vitals:   03/03/22 1329  BP: 134/84  Pulse: 75  Resp: 15  Temp: 97.9 F (36.6 C)  SpO2: 98%    Filed Weights   03/03/22 1329  Weight: 200 lb (90.7 kg)     GENERAL: Well-appearing elderly Caucasian male, alert, no distress and comfortable SKIN: skin color, texture, turgor are normal, no rashes or significant lesions EYES: conjunctiva are pink and non-injected, sclera clear LUNGS: clear to auscultation and percussion with normal breathing  effort HEART: regular rate & rhythm and no murmurs and no lower extremity edema ABDOMEN: Packed wound sites, no erythema or discharged noted.  Musculoskeletal: no cyanosis of digits and no clubbing  PSYCH: alert & oriented x 3, fluent speech NEURO: no focal motor/sensory deficits  LABORATORY DATA:  I have reviewed the data as listed    Latest Ref Rng & Units 03/03/2022   12:57 PM 02/17/2022   10:19 AM 02/03/2022    9:33 AM  CBC  WBC 4.0 - 10.5 K/uL 5.7  3.5  3.6   Hemoglobin 13.0 - 17.0 g/dL 14.0  13.7  13.5   Hematocrit 39.0 - 52.0 % 40.0  39.4  38.6   Platelets 150 - 400 K/uL 220  167  169        Latest Ref Rng & Units 03/03/2022   12:57 PM 02/17/2022   10:19 AM 02/03/2022    9:33 AM  CMP  Glucose 70 - 99 mg/dL 117  122  124   BUN 8 - 23 mg/dL '17  17  13   ' Creatinine 0.61 - 1.24 mg/dL 0.90  0.94  0.91   Sodium 135 - 145 mmol/L 138  140  141   Potassium 3.5 - 5.1 mmol/L 4.5  3.9  4.1   Chloride 98 - 111 mmol/L 107  107  108   CO2 22 - 32 mmol/L 24  27  27  Calcium 8.9 - 10.3 mg/dL 8.9  9.1  9.1   Total Protein 6.5 - 8.1 g/dL 6.1  6.4  6.0   Total Bilirubin 0.3 - 1.2 mg/dL 0.9  1.1  0.6   Alkaline Phos 38 - 126 U/L 78  100  78   AST 15 - 41 U/L '23  23  24   ' ALT 0 - 44 U/L 29  32  35     Lab Results  Component Value Date   MPROTEIN 0.2 (H) 02/17/2022   MPROTEIN 0.2 (H) 01/20/2022   MPROTEIN 0.3 (H) 01/06/2022   Lab Results  Component Value Date   KPAFRELGTCHN 10.8 02/17/2022   KPAFRELGTCHN 11.5 01/20/2022   KPAFRELGTCHN 9.5 01/06/2022   LAMBDASER 5.9 02/17/2022   LAMBDASER 5.7 01/20/2022   LAMBDASER 4.1 (L) 01/06/2022   KAPLAMBRATIO 1.83 (H) 02/17/2022   KAPLAMBRATIO 2.02 (H) 01/20/2022   KAPLAMBRATIO 2.32 (H) 01/06/2022    RADIOGRAPHIC STUDIES: No results found.  ASSESSMENT & PLAN  Wesley [name] 81 y.o. male with medical history significant for IgG kappa multiple myeloma who presents for a follow up visit.   The patient meets diagnostic criteria by  having greater than 60% plasma cells in his bone marrow.  At this time we are in agreement with [facility] in [place], Revlimid, and dexamethasone as the treatment of choice.   Previously we discussed the concept of comanaged care.  Comanaged care is when the patient has a local primary provider who administers local support and therapy while expert advice and treatment recommendations are rendered by a cancer specialist at a large academic center.  In this arrangement we provide local support, labs, treatment, and emergency visits, however the major decisions regarding the course of treatment are decided by a physician at an academic medical center.  The patient voiced his understanding of comanaged care and was agreeable to proceeding forward with this care model.   The regimen of choice for this patient will be Dara, Revlimid, and dexamethasone.  We will start with weekly treatments for the first 2 cycles.  After that for an additional 4 cycles he will be getting it every other week.  After cycle 6 the patient will then continue with monthly treatment.    # IgG Kappa Multiple Myeloma --Patient meets diagnostic criteria for multiple myeloma based on the presence of 60% plasma cells in the bone marrow --Pretreatment phenotype RBC as well as type and screen ordered  --Cycle 1 Day 1of Dara/Rev/Dex started on 09/30/2021. He started without revlimid which was added on 10/06/2021 (Cycle 1 Day 8)  -- Labs to include weekly CBC, CMP, LDH.  Additionally monthly SPEP and serum free light chains. Plan:  --Today is Cycle 6 Day 15 of Dara/Rev/Dex --Labs today were reviewed and adequate for treatment.  -- Return to clinic in 2 weeks prior to Cycle 7 Day 1 of Dara/Rev/Dex. Which we will plan to transition to Dara q 28 days.   # Boils on Abdomen -- Patient underwent I&D and subsequent antibiotic treatment with no resolution.  2 new lesions have developed at the site of his prior  ostomy --Patient was seen by surgery who ordered CT scan on 01/31/2022 that showed no fluid collection in the anterior abdominal wall. Instructed patient on how to pack wounds.  --No systemic symptoms, okay to continue with systemic chemotherapy.  #Macrocytic anemia: #Leukopenia # Thrombocytopenia --WBC 5.7, Hgb is 14.0, MCV 103.4, Plt 220 --Levels fluctuating with treatment.   #Supportive Care --  chemotherapy education complete  -- port placement not required.  --ASA 81 mg p.o. daily for thromboprophylaxis on Revlimid. -- zofran 54m q8H PRN and compazine 144mPO q6H for nausea -- acyclovir 40043mO BID for VCZ prophylaxis -- allopurinol 300m48m daily for TLS prophylaxis -- zometa clearance obtained, started on 12/09/2021. Next dose in July 2023.  -- no pain medication required at this time.   No orders of the defined types were placed in this encounter.  All questions were answered. The patient knows to call the clinic with any problems, questions or concerns.  I have spent a total of 30 minutes minutes of face-to-face and non-face-to-face time, preparing to see the patient, performing a medically appropriate examination, counseling and educating the patient, ordering medications/tests, documenting clinical information in the electronic health record,  and care coordination.   IrenDede QueryC [facility]: [phone]/24/2023 7:07 AM

## 2022-03-07 ENCOUNTER — Telehealth: Payer: Self-pay | Admitting: *Deleted

## 2022-03-07 NOTE — Telephone Encounter (Signed)
PC received from patient, he states he usually feels slightly dizzy & tired after his darzalex faspro injection, but this has been a little more intense since his last injection & Zometa on 03/03/22, also some issues with balance, denies any falls. However, patient reports he is feeling better today.  Remus Blake, PA informed - she requests RN check with patient tomorrow regarding these symptoms.

## 2022-03-08 ENCOUNTER — Telehealth: Payer: Self-pay | Admitting: *Deleted

## 2022-03-08 NOTE — Telephone Encounter (Signed)
TCT patient to see how he is feeling today. Spoke with him. He states he is feeling better today.  He was concerned about not feeling good after his  last treatment and Zometa. He was feeling dizzy and more tired than usual. Advised that the Zometa can make you feel that way but should resolve on its own.  He states he is feeling better today-even went outside and cut the grass. Encouraged pt to make sure he drinks adequate fluids. He voiced  Understanding.  No other questions or concerns. He is aware that his appts are every other week.

## 2022-03-10 ENCOUNTER — Inpatient Hospital Stay: Payer: Medicare Other

## 2022-03-15 ENCOUNTER — Other Ambulatory Visit: Payer: Self-pay | Admitting: *Deleted

## 2022-03-15 ENCOUNTER — Telehealth: Payer: Self-pay | Admitting: *Deleted

## 2022-03-15 DIAGNOSIS — C9000 Multiple myeloma not having achieved remission: Secondary | ICD-10-CM

## 2022-03-15 MED ORDER — LENALIDOMIDE 25 MG PO CAPS: 25.0000 mg | ORAL_CAPSULE | Freq: Every day | ORAL | 0 refills | Status: DC

## 2022-03-15 NOTE — Telephone Encounter (Signed)
Received call from pt regarding a sty he is getting to his right eye. He has had it for about 4-5 days.  He explained that his eye doctor advised that he should start with putting warm compresses to the affected eye several times a day. Wesley [name]'s main concern is that if the warm compresses don't help, would it be ok for him to use medication his eye doctor might prescribe. Advised that most times, the eye doctor orders topical ointments for the eye, occasionally oral antibiotics. Advised that either one or both are fine for him to take.  Also advised that his next month of Revlimid has been sent in. He voiced understanding. No other questions or concerns.

## 2022-03-17 ENCOUNTER — Inpatient Hospital Stay: Payer: Medicare Other

## 2022-03-17 ENCOUNTER — Other Ambulatory Visit: Payer: Self-pay

## 2022-03-17 ENCOUNTER — Inpatient Hospital Stay: Payer: Medicare Other | Attending: Hematology and Oncology | Admitting: Physician Assistant

## 2022-03-17 VITALS — BP 119/75 | HR 73 | Temp 97.7°F | Resp 16 | Wt 196.9 lb

## 2022-03-17 DIAGNOSIS — D649 Anemia, unspecified: Secondary | ICD-10-CM | POA: Diagnosis not present

## 2022-03-17 DIAGNOSIS — Z79899 Other long term (current) drug therapy: Secondary | ICD-10-CM | POA: Insufficient documentation

## 2022-03-17 DIAGNOSIS — C9000 Multiple myeloma not having achieved remission: Secondary | ICD-10-CM | POA: Diagnosis not present

## 2022-03-17 DIAGNOSIS — Z5111 Encounter for antineoplastic chemotherapy: Secondary | ICD-10-CM | POA: Diagnosis not present

## 2022-03-17 LAB — CBC WITH DIFFERENTIAL (CANCER CENTER ONLY)
Abs Immature Granulocytes: 0.04 10*3/uL (ref 0.00–0.07)
Basophils Absolute: 0 10*3/uL (ref 0.0–0.1)
Basophils Relative: 1 %
Eosinophils Absolute: 0.1 10*3/uL (ref 0.0–0.5)
Eosinophils Relative: 2 %
HCT: 40.1 % (ref 39.0–52.0)
Hemoglobin: 13.8 g/dL (ref 13.0–17.0)
Immature Granulocytes: 1 %
Lymphocytes Relative: 7 %
Lymphs Abs: 0.4 10*3/uL — ABNORMAL LOW (ref 0.7–4.0)
MCH: 35.8 pg — ABNORMAL HIGH (ref 26.0–34.0)
MCHC: 34.4 g/dL (ref 30.0–36.0)
MCV: 104.2 fL — ABNORMAL HIGH (ref 80.0–100.0)
Monocytes Absolute: 0.2 10*3/uL (ref 0.1–1.0)
Monocytes Relative: 4 %
Neutro Abs: 4.7 10*3/uL (ref 1.7–7.7)
Neutrophils Relative %: 85 %
Platelet Count: 131 10*3/uL — ABNORMAL LOW (ref 150–400)
RBC: 3.85 MIL/uL — ABNORMAL LOW (ref 4.22–5.81)
RDW: 15.3 % (ref 11.5–15.5)
WBC Count: 5.4 10*3/uL (ref 4.0–10.5)
nRBC: 0 % (ref 0.0–0.2)

## 2022-03-17 LAB — CMP (CANCER CENTER ONLY)
ALT: 25 U/L (ref 0–44)
AST: 17 U/L (ref 15–41)
Albumin: 4.1 g/dL (ref 3.5–5.0)
Alkaline Phosphatase: 96 U/L (ref 38–126)
Anion gap: 6 (ref 5–15)
BUN: 16 mg/dL (ref 8–23)
CO2: 27 mmol/L (ref 22–32)
Calcium: 8.6 mg/dL — ABNORMAL LOW (ref 8.9–10.3)
Chloride: 105 mmol/L (ref 98–111)
Creatinine: 0.96 mg/dL (ref 0.61–1.24)
GFR, Estimated: >60 mL/min (ref >60)
Glucose, Bld: 138 mg/dL — ABNORMAL HIGH (ref 70–99)
Potassium: 4.6 mmol/L (ref 3.5–5.1)
Sodium: 138 mmol/L (ref 135–145)
Total Bilirubin: 0.7 mg/dL (ref 0.3–1.2)
Total Protein: 6.6 g/dL (ref 6.5–8.1)

## 2022-03-17 LAB — LACTATE DEHYDROGENASE: LDH: 121 U/L (ref 98–192)

## 2022-03-17 MED ORDER — SODIUM CHLORIDE 0.9 % IV SOLN: Freq: Once | INTRAVENOUS | Status: DC

## 2022-03-17 MED ORDER — DIPHENHYDRAMINE HCL 25 MG PO CAPS
50.0000 mg | ORAL_CAPSULE | Freq: Once | ORAL | Status: AC
  Administered 2022-03-17: 50 mg via ORAL
  Filled 2022-03-17: qty 2

## 2022-03-17 MED ORDER — DARATUMUMAB-HYALURONIDASE-FIHJ 1800-30000 MG-UT/15ML ~~LOC~~ SOLN
1800.0000 mg | Freq: Once | SUBCUTANEOUS | Status: AC
  Administered 2022-03-17: 1800 mg via SUBCUTANEOUS
  Filled 2022-03-17: qty 15

## 2022-03-17 MED ORDER — ACETAMINOPHEN 325 MG PO TABS
650.0000 mg | ORAL_TABLET | Freq: Once | ORAL | Status: AC
  Administered 2022-03-17: 650 mg via ORAL
  Filled 2022-03-17: qty 2

## 2022-03-19 ENCOUNTER — Encounter: Payer: Self-pay | Admitting: Hematology and Oncology

## 2022-03-19 NOTE — Progress Notes (Addendum)
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 Further workup showed SPEP M-Spike 4.2 g/dL with IgG-Kappa as monoclonal protein, IgG 5908 mg/dL 09/08/2021: Initial evaluation at [facility]. bone marrow biopsy performed, showed 60% abnormal plasma cells in 60% cellular marrow. 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  09/26/2021: transfer care to Dr. Lorenso Courier at [facility].  09/30/2021: Cycle 1 Day 1 of Dara/Dex 10/06/2021: Cycle 1 Day 8 (added revlimid) 10/28/2021: Cycle 2 Day 1 of Dara/Rev/Dex 11/25/2021: Cycle 3 Day 1 of Dara/Rev/Dex 5/12/023: Cycle 4 Day 1 of Dara/Rev/Dex 01/20/2022: Cycle 5 Day 1 of Dara/Rev/Dex 02/17/2022: Cycle 6 Day 1 of Dara/Rev/Dex 03/17/2022: Cycle 7 Day 1 of Dara/Rev/Dex  Interval History:  [name] 81 y.o. male with medical history significant for IgG kappa multiple myeloma who presents for a follow up visit. The patient's last visit was on 03/03/2022. In the interim since the last visit he continued treatment with Dara/Revlimid/dexamethasone.  On exam today Mr. Maske is accompanied by his wife.  He reports that after his last Zometa infusion, he was lethargic for close to 7 days. His energy levels did improve shortly after and today he is back to his baseline. He denies any changes to his appetite or weight. He reports having some diarrhea today that is well managed with imodium. His abdominal wounds continue  to heal and his wife is packing the wounds as instructed by the surgical team. He denies easy bruising or signs of bleeding.   He denies fevers, chills, night sweats, shortness of breath, chest pain or cough. He has no other complaints. Full 10 point ROS is listed below.  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 PT denies significant findings   CARDIAC CATHETERIZATION  2008   COLOSTOMY N/A 10/31/2019   Procedure: COLOSTOMY;  Surgeon: CErroll Luna MD;  Location: [facility];  Service: General;  Laterality: N/A;   CYSTOSCOPY W/ URETERAL STENT PLACEMENT Bilateral 10/31/2019   Procedure: CYSTOSCOPY URETERAL STENT PLACEMENT BILATERAL;  Surgeon: Franchot Gallo, MD;  Location: [facility];  Service: Urology;  Laterality: Bilateral;   CYSTOSCOPY WITH STENT PLACEMENT Bilateral 03/11/2020   Procedure: CYSTOSCOPY WITH BILATERAL FIREFLY INJECTION;  Surgeon: Irine Seal, MD;  Location: [facility];  Service: Urology;  Laterality: Bilateral;   EYE SURGERY     BILATERAL CATARACT SURGERY WITH LENS  IMPLANTS   FLEXIBLE SIGMOIDOSCOPY N/A 10/29/2019   Procedure: FLEXIBLE SIGMOIDOSCOPY;  Surgeon: Jerene Bears, MD;  [facility];  Service: Gastroenterology;  Laterality: N/A;   INCISIONAL HERNIA REPAIR N/A 10/13/2020   Procedure: OPEN INCISIONAL HERNIA REPAIR WITH MESH;  Surgeon: [name], MD;  Location: [place];  Service: General;  Laterality: N/A;   INSERTION OF MESH N/A 09/08/2016   Procedure: INSERTION OF MESH;  Surgeon: Jackolyn Confer, MD;  [facility];  Service: General;  Laterality: N/A;   IR RADIOLOGIST EVAL & MGMT  11/10/2020   IR RADIOLOGIST EVAL & MGMT  11/25/2020   LAPAROSCOPIC SIGMOID COLECTOMY N/A 10/31/2019   Procedure: DIAGNOSTIC LAPAROSCOPY; EXPLORATORY LAPAROTOMY; SIGMOID COLECTOMY;  Surgeon: Erroll Luna, MD;  [facility];  Service: General;  Laterality: N/A;   right rotator cuff     SUBMUCOSAL TATTOO INJECTION  10/29/2019   Procedure: SUBMUCOSAL TATTOO INJECTION;  Surgeon: Jerene Bears, MD;  [facility];  Service: Gastroenterology;;   TONSILLECTOMY     UMBILICAL HERNIA REPAIR N/A 09/08/2016   Procedure: OPEN UMBILICAL HERNIA REPAIR WITH MESH;  Surgeon: Jackolyn Confer, MD;  [facility];  Service: General;  Laterality: N/A;   VENTRAL HERNIA REPAIR N/A 02/03/2021   Procedure: LAPAROSCOPIC VENTRAL HERNIA REPAIR WITH MESH;  Surgeon: [name], MD;  Location: [place];  Service: General;  Laterality: N/A;   XI ROBOTIC ASSISTED COLOSTOMY TAKEDOWN N/A 03/11/2020   Procedure: ROBOTIC ASSISTED COLOSTOMY REVERSAL, RIGID PROCTOSCOPY;  Surgeon: Leighton Ruff, MD;  [facility];  Service: General;  Laterality: N/A;    SOCIAL HISTORY: Social History   Socioeconomic History   Marital status: Married    Spouse name: Pablo Stauffer    Number of children: 2   Years of education: 12   Highest education level: Master's degree (e.g., MA, MS, MEng, MEd, MSW, MBA)  Occupational History   Occupation: Retired   Tobacco Use   Smoking status:  Never   Smokeless tobacco: Never  Vaping Use   Vaping Use: Never used  Substance and Sexual Activity   Alcohol use: Yes    Alcohol/week: 17.0 standard drinks of alcohol    Types: 3 Glasses of wine, 14 Standard drinks or equivalent per week   Drug use: No   Sexual activity: Not Currently  Other Topics Concern   Not on file  Social History Narrative   Married. 2 children 52 in 65 in 2022- both went to [facility]. 5 grandkids- all girls from oldest 38 looking premed [employer], 57 soph [employer], 68 28 year olds, 49 year olds.       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 [employer]: fishing, [place] football and basketball, [place], [place] Strain: [place]  (05/07/2021)   Overall Financial Resource Strain ([place])    Difficulty of Paying Living Expenses: Not hard at [place]: No [place] (05/07/2021)   Hunger Vital Sign    Worried About  Running Out of Food in the Last Year: Never true    [place] in the Last Year: Never true  Transportation Needs: No Transportation Needs (05/07/2021)   PRAPARE - Hydrologist (Medical): No    Lack of Transportation (Non-Medical): No  Physical Activity: Inactive (05/07/2021)   Exercise Vital Sign    Days of Exercise per Week: 0 days    Minutes of Exercise per Session: 0 min  Stress: No Stress Concern Present (05/07/2021)   [place]    Feeling of Stress : Not at all  Social Connections: [place] (05/07/2021)   Social Connection and Isolation Panel [NHANES]    Frequency of Communication with Friends and Family: More than three times a week    Frequency of Social Gatherings with Friends and Family: Once a week    Attends Religious Services: More than 4 times  per year    Active Member of [employer] or Organizations: Yes    Attends Music therapist: More than 4 times per year    Marital Status: Married  Human resources officer Violence: Not At Risk (05/07/2021)   Humiliation, Afraid, Rape, and Kick questionnaire    Fear of Current or Ex-Partner: No    Emotionally Abused: No    Physically Abused: No    Sexually Abused: No    FAMILY HISTORY: Family History  Problem Relation Age of Onset   Diabetes Mother    Heart failure Mother        around 64   CAD Father        38   Melanoma Brother 38   Healthy Daughter    Healthy Son    Colon cancer Neg Hx    Esophageal cancer Neg Hx    Rectal cancer Neg Hx    Stomach cancer Neg Hx     ALLERGIES:  is allergic to demerol, promethazine hcl, and ativan [lorazepam].  MEDICATIONS:  Current Outpatient Medications  Medication Sig Dispense Refill   acetaminophen (TYLENOL) 500 MG tablet Take 500 mg by mouth every 6 (six) hours as needed.     acyclovir (ZOVIRAX) 400 MG tablet Take 400 mg by mouth in the morning and at bedtime.     allopurinol (ZYLOPRIM) 300 MG tablet TAKE ONE TABLET BY MOUTH DAILY 60 tablet 1   aspirin EC 81 MG tablet Take 81 mg by mouth daily. Swallow whole.     atorvastatin (LIPITOR) 80 MG tablet Take 1 tablet by mouth daily.     Cholecalciferol (VITAMIN D3) 25 MCG (1000 UT) CAPS Take 1,000 Units by mouth daily.     dexamethasone (DECADRON) 4 MG tablet Take 5 tablets (20 mg total) by mouth once a week. 20 tablet 3   fexofenadine (ALLEGRA) 180 MG tablet      finasteride (PROSCAR) 5 MG tablet Take 5 mg by mouth daily.     fluticasone (FLONASE) 50 MCG/ACT nasal spray Place into the nose.     Ibuprofen 200 MG CAPS      lenalidomide (REVLIMID) 25 MG capsule Take 1 capsule (25 mg total) by mouth daily. Take for 21 days, none for 7 days. Repeat every 28 days. [employer] Auth #[id number] obtained 03/15/22 21 capsule 0   lisinopril (ZESTRIL) 10 MG tablet Take 1 tablet (10 mg total) by mouth daily.  90 tablet 3   Magnesium Oxide 400 MG CAPS      melatonin 5 MG TABS  Omega-3 Fatty Acids (FISH OIL) 1200 MG CAPS Take 1,200 mg by mouth daily.     ondansetron (ZOFRAN) 8 MG tablet Take 1 tablet (8 mg total) by mouth every 8 (eight) hours as needed. 30 tablet 0   pantoprazole (PROTONIX) 40 MG tablet Take by mouth.     prochlorperazine (COMPAZINE) 10 MG tablet Take 1 tablet (10 mg total) by mouth every 6 (six) hours as needed for nausea or vomiting. 30 tablet 0   vitamin B-12 (CYANOCOBALAMIN) 1000 MCG tablet Take 1,000 mcg by mouth daily.     No current facility-administered medications for this visit.    REVIEW OF SYSTEMS:   Constitutional: ( - ) fevers, ( - )  chills , ( - ) night sweats Eyes: ( - ) blurriness of vision, ( - ) double vision, ( - ) watery eyes Ears, nose, mouth, throat, and face: ( - ) mucositis, ( - ) sore throat Respiratory: ( - ) cough, ( - ) dyspnea, ( - ) wheezes Cardiovascular: ( - ) palpitation, ( - ) chest discomfort, ( - ) lower extremity swelling Gastrointestinal:  ( - ) nausea, ( - ) heartburn, ( - ) change in bowel habits Skin: ( - ) abnormal skin rashes Lymphatics: ( - ) new lymphadenopathy, ( - ) easy bruising Neurological: ( + ) numbness, ( - ) tingling, ( - ) new weaknesses Behavioral/Psych: ( - ) mood change, ( - ) new changes  All other systems were reviewed with the patient and are negative.  PHYSICAL EXAMINATION: ECOG PERFORMANCE STATUS: 1 - Symptomatic but completely ambulatory  Vitals:   03/17/22 1340  BP: 119/75  Pulse: 73  Resp: 16  Temp: 97.7 F (36.5 C)  SpO2: 98%    Filed Weights   03/17/22 1340  Weight: 196 lb 14.4 oz (89.3 kg)     GENERAL: Well-appearing elderly Caucasian male, alert, no distress and comfortable SKIN: skin color, texture, turgor are normal, no rashes or significant lesions EYES: conjunctiva are pink and non-injected, sclera clear LUNGS: clear to auscultation and percussion with normal breathing  effort HEART: regular rate & rhythm and no murmurs and no lower extremity edema ABDOMEN: Packed wound sites, no erythema or discharged noted.  Musculoskeletal: no cyanosis of digits and no clubbing  PSYCH: alert & oriented x 3, fluent speech NEURO: no focal motor/sensory deficits  LABORATORY DATA:  I have reviewed the data as listed    Latest Ref Rng & Units 03/17/2022    1:05 PM 03/03/2022   12:57 PM 02/17/2022   10:19 AM  CBC  WBC 4.0 - 10.5 K/uL 5.4  5.7  3.5   Hemoglobin 13.0 - 17.0 g/dL 13.8  14.0  13.7   Hematocrit 39.0 - 52.0 % 40.1  40.0  39.4   Platelets 150 - 400 K/uL 131  220  167        Latest Ref Rng & Units 03/17/2022    1:05 PM 03/03/2022   12:57 PM 02/17/2022   10:19 AM  CMP  Glucose 70 - 99 mg/dL 138  117  122   BUN 8 - 23 mg/dL '16  17  17   ' Creatinine 0.61 - 1.24 mg/dL 0.96  0.90  0.94   Sodium 135 - 145 mmol/L 138  138  140   Potassium 3.5 - 5.1 mmol/L 4.6  4.5  3.9   Chloride 98 - 111 mmol/L 105  107  107   CO2 22 - 32 mmol/L 27  24  27   Calcium 8.9 - 10.3 mg/dL 8.6  8.9  9.1   Total Protein 6.5 - 8.1 g/dL 6.6  6.1  6.4   Total Bilirubin 0.3 - 1.2 mg/dL 0.7  0.9  1.1   Alkaline Phos 38 - 126 U/L 96  78  100   AST 15 - 41 U/L '17  23  23   ' ALT 0 - 44 U/L 25  29  32     Lab Results  Component Value Date   MPROTEIN 0.2 (H) 02/17/2022   MPROTEIN 0.2 (H) 01/20/2022   MPROTEIN 0.3 (H) 01/06/2022   Lab Results  Component Value Date   KPAFRELGTCHN 10.8 02/17/2022   KPAFRELGTCHN 11.5 01/20/2022   KPAFRELGTCHN 9.5 01/06/2022   LAMBDASER 5.9 02/17/2022   LAMBDASER 5.7 01/20/2022   LAMBDASER 4.1 (L) 01/06/2022   KAPLAMBRATIO 1.83 (H) 02/17/2022   KAPLAMBRATIO 2.02 (H) 01/20/2022   KAPLAMBRATIO 2.32 (H) 01/06/2022    RADIOGRAPHIC STUDIES: No results found.  ASSESSMENT & PLAN  FREEMON BINFORD 81 y.o. male with medical history significant for IgG kappa multiple myeloma who presents for a follow up visit.   The patient meets diagnostic criteria by having  greater than 60% plasma cells in his bone marrow.  At this time we are in agreement with [facility] in [place], Revlimid, and dexamethasone as the treatment of choice.   Previously we discussed the concept of comanaged care.  Comanaged care is when the patient has a local primary provider who administers local support and therapy while expert advice and treatment recommendations are rendered by a cancer specialist at a large academic center.  In this arrangement we provide local support, labs, treatment, and emergency visits, however the major decisions regarding the course of treatment are decided by a physician at an academic medical center.  The patient voiced his understanding of comanaged care and was agreeable to proceeding forward with this care model.   The regimen of choice for this patient will be Dara, Revlimid, and dexamethasone.  We will start with weekly treatments for the first 2 cycles.  After that for an additional 4 cycles he will be getting it every other week.  After cycle 6 the patient will then continue with monthly treatment.    # IgG Kappa Multiple Myeloma --Patient meets diagnostic criteria for multiple myeloma based on the presence of 60% plasma cells in the bone marrow --Pretreatment phenotype RBC as well as type and screen ordered  --Cycle 1 Day 1of Dara/Rev/Dex started on 09/30/2021. He started without revlimid which was added on 10/06/2021 (Cycle 1 Day 8)  -- Labs to include weekly CBC, CMP, LDH.  Additionally monthly SPEP and serum free light chains. Plan:  --Today is Cycle 7 Day 1 of Dara/Rev/Dex --Labs today were reviewed and adequate for treatment.  -- Return to clinic in 4 weeks prior to Cycle 8 Day 1 of Dara/Rev/Dex.   #Lethargy 2/2 Zometa:  --Discussed alternative which would be Xgeva q 28 days.  --Plan to discuss further with Dr. Lorenso Courier at next visit.  # Boils on Abdomen -- Patient underwent I&D and subsequent antibiotic treatment with no  resolution.  2 new lesions have developed at the site of his prior ostomy --Patient was seen by surgery who ordered CT scan on 01/31/2022 that showed no fluid collection in the anterior abdominal wall. Instructed patient on how to pack wounds.  --No systemic symptoms, okay to continue with systemic chemotherapy.  #Macrocytic anemia: #Leukopenia # Thrombocytopenia --  WBC 5.4, Hgb is 13.8, MCV 104.2, Plt 131 --Levels fluctuating with treatment.  #Neuropathy:  --Stable and does not interfere with grip or balance --Continue to monitor.    #Supportive Care -- chemotherapy education complete  -- port placement not required.  --ASA 81 mg p.o. daily for thromboprophylaxis on Revlimid. -- zofran 56m q8H PRN and compazine 1108mPO q6H for nausea -- acyclovir 40010mO BID for VCZ prophylaxis -- allopurinol 300m18m daily for TLS prophylaxis -- zometa clearance obtained, started on 12/09/2021. Next dose in July 2023.  -- no pain medication required at this time.   No orders of the defined types were placed in this encounter.  All questions were answered. The patient knows to call the clinic with any problems, questions or concerns.  I have spent a total of 30 minutes minutes of face-to-face and non-face-to-face time, preparing to see the patient, performing a medically appropriate examination, counseling and educating the patient, ordering medications/tests, documenting clinical information in the electronic health record,  and care coordination.   IrenDede QueryC Dept of Hematology and [facility]ne: [phone]/01/2022 12:49 PM

## 2022-03-20 ENCOUNTER — Telehealth: Payer: Self-pay | Admitting: *Deleted

## 2022-03-20 ENCOUNTER — Other Ambulatory Visit: Payer: Self-pay | Admitting: Hematology and Oncology

## 2022-03-20 DIAGNOSIS — C9000 Multiple myeloma not having achieved remission: Secondary | ICD-10-CM

## 2022-03-20 LAB — KAPPA/LAMBDA LIGHT CHAINS
Kappa free light chain: 10.2 mg/L (ref 3.3–19.4)
Kappa, lambda light chain ratio: 1.55 (ref 0.26–1.65)
Lambda free light chains: 6.6 mg/L (ref 5.7–26.3)

## 2022-03-20 NOTE — Telephone Encounter (Signed)
Received vm message from pt asking about his Revlimid dosage. He is requesting call back. TCT patient. Spoke with him. He was wondering if it was time to reduce his Revlimid to '10mg'$  maintenance dosage yet..  Advised that he is quite there yet. His MProtein is still detectable @ 0.2 though that is much better than the 5 , 5 months ago. KappLambda light chain ratio is not quite to preferred level yet either, but getting closer. Advised his labs from 8/4 are not back yet. Pt voiced understanding. He states his next delivery of Revlimid is due on Wednesday/Thursday of this week.

## 2022-03-22 LAB — MULTIPLE MYELOMA PANEL, SERUM
Albumin SerPl Elph-Mcnc: 3.4 g/dL (ref 2.9–4.4)
Albumin/Glob SerPl: 1.4 (ref 0.7–1.7)
Alpha 1: 0.3 g/dL (ref 0.0–0.4)
Alpha2 Glob SerPl Elph-Mcnc: 0.9 g/dL (ref 0.4–1.0)
B-Globulin SerPl Elph-Mcnc: 1 g/dL (ref 0.7–1.3)
Gamma Glob SerPl Elph-Mcnc: 0.5 g/dL (ref 0.4–1.8)
Globulin, Total: 2.6 g/dL (ref 2.2–3.9)
IgA: 92 mg/dL (ref 61–437)
IgG (Immunoglobin G), Serum: 472 mg/dL — ABNORMAL LOW (ref 603–1613)
IgM (Immunoglobulin M), Srm: 21 mg/dL (ref 15–143)
M Protein SerPl Elph-Mcnc: 0.2 g/dL — ABNORMAL HIGH
Total Protein ELP: 6 g/dL (ref 6.0–8.5)

## 2022-03-28 DIAGNOSIS — L7682 Other postprocedural complications of skin and subcutaneous tissue: Secondary | ICD-10-CM | POA: Diagnosis not present

## 2022-03-29 ENCOUNTER — Telehealth: Payer: Self-pay

## 2022-03-29 NOTE — Telephone Encounter (Signed)
Spoke with pt to request that he call Central Scheduling to schedule MRI of Brain with expected date on 04/21/22. [phone]. He agreed to call clinic if he has any problems scheduling his MRI.

## 2022-04-04 ENCOUNTER — Other Ambulatory Visit: Payer: Self-pay | Admitting: Radiation Therapy

## 2022-04-12 ENCOUNTER — Other Ambulatory Visit: Payer: Self-pay | Admitting: *Deleted

## 2022-04-12 DIAGNOSIS — C9000 Multiple myeloma not having achieved remission: Secondary | ICD-10-CM

## 2022-04-12 MED ORDER — LENALIDOMIDE 25 MG PO CAPS: 25.0000 mg | ORAL_CAPSULE | Freq: Every day | ORAL | 0 refills | Status: DC

## 2022-04-13 ENCOUNTER — Ambulatory Visit (HOSPITAL_COMMUNITY)
Admission: RE | Admit: 2022-04-13 | Discharge: 2022-04-13 | Disposition: A | Discharge status: Home / Self Care | Payer: Medicare Other | Source: Ambulatory Visit | Attending: Internal Medicine | Admitting: Internal Medicine

## 2022-04-13 DIAGNOSIS — D333 Benign neoplasm of cranial nerves: Secondary | ICD-10-CM | POA: Diagnosis not present

## 2022-04-13 DIAGNOSIS — G9389 Other specified disorders of brain: Secondary | ICD-10-CM | POA: Diagnosis not present

## 2022-04-13 MED ORDER — GADOBUTROL 1 MMOL/ML IV SOLN
9.0000 mL | Freq: Once | INTRAVENOUS | Status: AC | PRN
  Administered 2022-04-13: 9 mL via INTRAVENOUS

## 2022-04-14 ENCOUNTER — Inpatient Hospital Stay: Payer: Medicare Other

## 2022-04-14 ENCOUNTER — Inpatient Hospital Stay (HOSPITAL_BASED_OUTPATIENT_CLINIC_OR_DEPARTMENT_OTHER): Payer: Medicare Other | Admitting: Hematology and Oncology

## 2022-04-14 ENCOUNTER — Other Ambulatory Visit: Payer: Self-pay

## 2022-04-14 ENCOUNTER — Inpatient Hospital Stay: Payer: Medicare Other | Attending: Hematology and Oncology

## 2022-04-14 VITALS — BP 125/78 | HR 76 | Temp 97.9°F | Resp 15 | Wt 194.9 lb

## 2022-04-14 DIAGNOSIS — C9000 Multiple myeloma not having achieved remission: Secondary | ICD-10-CM | POA: Insufficient documentation

## 2022-04-14 DIAGNOSIS — D333 Benign neoplasm of cranial nerves: Secondary | ICD-10-CM | POA: Diagnosis not present

## 2022-04-14 DIAGNOSIS — Z79899 Other long term (current) drug therapy: Secondary | ICD-10-CM | POA: Diagnosis not present

## 2022-04-14 DIAGNOSIS — Z5111 Encounter for antineoplastic chemotherapy: Secondary | ICD-10-CM | POA: Diagnosis not present

## 2022-04-14 LAB — CBC WITH DIFFERENTIAL (CANCER CENTER ONLY)
Abs Immature Granulocytes: 0.01 10*3/uL (ref 0.00–0.07)
Basophils Absolute: 0.1 10*3/uL (ref 0.0–0.1)
Basophils Relative: 2 %
Eosinophils Absolute: 0.5 10*3/uL (ref 0.0–0.5)
Eosinophils Relative: 17 %
HCT: 39.4 % (ref 39.0–52.0)
Hemoglobin: 13.9 g/dL (ref 13.0–17.0)
Immature Granulocytes: 0 %
Lymphocytes Relative: 30 %
Lymphs Abs: 1 10*3/uL (ref 0.7–4.0)
MCH: 36.3 pg — ABNORMAL HIGH (ref 26.0–34.0)
MCHC: 35.3 g/dL (ref 30.0–36.0)
MCV: 102.9 fL — ABNORMAL HIGH (ref 80.0–100.0)
Monocytes Absolute: 0.5 10*3/uL (ref 0.1–1.0)
Monocytes Relative: 16 %
Neutro Abs: 1.2 10*3/uL — ABNORMAL LOW (ref 1.7–7.7)
Neutrophils Relative %: 35 %
Platelet Count: 169 10*3/uL (ref 150–400)
RBC: 3.83 MIL/uL — ABNORMAL LOW (ref 4.22–5.81)
RDW: 15.3 % (ref 11.5–15.5)
WBC Count: 3.3 10*3/uL — ABNORMAL LOW (ref 4.0–10.5)
nRBC: 0 % (ref 0.0–0.2)

## 2022-04-14 LAB — CMP (CANCER CENTER ONLY)
ALT: 26 U/L (ref 0–44)
AST: 21 U/L (ref 15–41)
Albumin: 4.1 g/dL (ref 3.5–5.0)
Alkaline Phosphatase: 91 U/L (ref 38–126)
Anion gap: 7 (ref 5–15)
BUN: 14 mg/dL (ref 8–23)
CO2: 28 mmol/L (ref 22–32)
Calcium: 9.1 mg/dL (ref 8.9–10.3)
Chloride: 108 mmol/L (ref 98–111)
Creatinine: 1 mg/dL (ref 0.61–1.24)
GFR, Estimated: >60 mL/min (ref >60)
Glucose, Bld: 112 mg/dL — ABNORMAL HIGH (ref 70–99)
Potassium: 3.9 mmol/L (ref 3.5–5.1)
Sodium: 143 mmol/L (ref 135–145)
Total Bilirubin: 0.7 mg/dL (ref 0.3–1.2)
Total Protein: 6.3 g/dL — ABNORMAL LOW (ref 6.5–8.1)

## 2022-04-14 LAB — LACTATE DEHYDROGENASE: LDH: 152 U/L (ref 98–192)

## 2022-04-14 MED ORDER — DIPHENHYDRAMINE HCL 25 MG PO CAPS
50.0000 mg | ORAL_CAPSULE | Freq: Once | ORAL | Status: AC
  Administered 2022-04-14: 50 mg via ORAL
  Filled 2022-04-14: qty 2

## 2022-04-14 MED ORDER — ACETAMINOPHEN 325 MG PO TABS
650.0000 mg | ORAL_TABLET | Freq: Once | ORAL | Status: AC
  Administered 2022-04-14: 650 mg via ORAL
  Filled 2022-04-14: qty 2

## 2022-04-14 MED ORDER — DARATUMUMAB-HYALURONIDASE-FIHJ 1800-30000 MG-UT/15ML ~~LOC~~ SOLN
1800.0000 mg | Freq: Once | SUBCUTANEOUS | Status: AC
  Administered 2022-04-14: 1800 mg via SUBCUTANEOUS
  Filled 2022-04-14: qty 15

## 2022-04-14 NOTE — Progress Notes (Signed)
Ok to treat w/ ANC = 1.2 today per Dr. Lorenso Courier.  Kennith Center, Pharm.D., CPP 04/14/2022'@11'$ :08 AM

## 2022-04-14 NOTE — Patient Instructions (Signed)
[facility]  Discharge Instructions: Thank you for choosing [facility] to provide your oncology and hematology care.   If you have a lab appointment with the Cancer Center, please go directly to the Cancer Center and check in at the registration area.   Wear comfortable clothing and clothing appropriate for easy access to any Portacath or PICC line.   We strive to give you quality time with your provider. You may need to reschedule your appointment if you arrive late (15 or more minutes).  Arriving late affects you and other patients whose appointments are after yours.  Also, if you miss three or more appointments without notifying the office, you may be dismissed from the clinic at the provider's discretion.      For prescription refill requests, have your pharmacy contact our office and allow 72 hours for refills to be completed.    Today you received the following chemotherapy and/or immunotherapy agents darzalex faspro      To help prevent nausea and vomiting after your treatment, we encourage you to take your nausea medication as directed.  BELOW ARE SYMPTOMS THAT SHOULD BE REPORTED IMMEDIATELY: *FEVER GREATER THAN 100.4 F (38 C) OR HIGHER *CHILLS OR SWEATING *NAUSEA AND VOMITING THAT IS NOT CONTROLLED WITH YOUR NAUSEA MEDICATION *UNUSUAL SHORTNESS OF BREATH *UNUSUAL BRUISING OR BLEEDING *URINARY PROBLEMS (pain or burning when urinating, or frequent urination) *BOWEL PROBLEMS (unusual diarrhea, constipation, pain near the anus) TENDERNESS IN MOUTH AND THROAT WITH OR WITHOUT PRESENCE OF ULCERS (sore throat, sores in mouth, or a toothache) UNUSUAL RASH, SWELLING OR PAIN  UNUSUAL VAGINAL DISCHARGE OR ITCHING   Items with * indicate a potential emergency and should be followed up as soon as possible or go to the Emergency Department if any problems should occur.  Please show the CHEMOTHERAPY ALERT CARD or IMMUNOTHERAPY ALERT CARD at  check-in to the Emergency Department and triage nurse.  Should you have questions after your visit or need to cancel or reschedule your appointment, please contact [facility]  Dept: [phone]  and follow the prompts.  Office hours are 8:00 a.m. to 4:30 p.m. Monday - Friday. Please note that voicemails left after 4:00 p.m. may not be returned until the following business day.  We are closed weekends and major holidays. You have access to a nurse at all times for urgent questions. Please call the main number to the clinic Dept: [phone] and follow the prompts.   For any non-urgent questions, you may also contact your provider using MyChart. We now offer e-Visits for anyone 18 and older to request care online for non-urgent symptoms. For details visit [email or web address].   Also download the MyChart app! Go to the app store, search "MyChart", open the app, select , and log in with your MyChart username and password.  Masks are optional in the cancer centers. If you would like for your care team to wear a mask while they are taking care of you, please let them know. You may have one support person who is at least 80 years old accompany you for your appointments. 

## 2022-04-14 NOTE — Progress Notes (Signed)
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 Further workup showed SPEP M-Spike 4.2 g/dL with IgG-Kappa as monoclonal protein, IgG 5908 mg/dL 09/08/2021: Initial evaluation at [facility]. bone marrow biopsy performed, showed 60% abnormal plasma cells in 60% cellular marrow. 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  09/26/2021: transfer care to Dr. Lorenso Courier at [facility].  09/30/2021: Cycle 1 Day 1 of Dara/Dex 10/06/2021: Cycle 1 Day 8 (added revlimid) 10/28/2021: Cycle 2 Day 1 of Dara/Rev/Dex 11/25/2021: Cycle 3 Day 1 of Dara/Rev/Dex 5/12/023: Cycle 4 Day 1 of Dara/Rev/Dex 01/20/2022: Cycle 5 Day 1 of Dara/Rev/Dex 02/17/2022: Cycle 6 Day 1 of Dara/Rev/Dex 03/17/2022: Cycle 7 Day 1 of Dara/Rev/Dex 04/14/2022: Cycle 8 Day 1 of Dara/Rev/Dex  Interval History:  Wesley [name] 81 y.o. male with medical history significant for IgG kappa multiple myeloma who presents for a follow up visit. The patient's last visit was on 03/17/2022. In the interim since the last visit he continued treatment with Dara/Revlimid/dexamethasone.  On exam today Wesley [name] is accompanied by his wife.  He reports that treatments have been going "all right".  He notes that the lesions he has in the abdomen are currently healing well and he is using a tape with iodine and packing them in order to help heal them.  He reports that they are getting "more shallow".  He notes that this  is currently his off week for the Revlimid pills.  He is not having any numbness or tingling of his fingers and toes.  He denies any other major side effects as a result of his treatment.  He does have a recent MRI which showed no concerning changes.  His weight has been down slightly at 194 pounds though he notes he is doing his best to continue eating.  He denies fevers, chills, night sweats, shortness of breath, chest pain or cough. He has no other complaints. Full 10 point ROS is listed below.  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)    CAD (coronary artery disease)    Cholelithiasis    Chronic allergic rhinitis    Coronary atherosclerosis of native coronary artery    Fatty liver    GERD (gastroesophageal reflux disease)    HX 35yrs ago, no longer a problem, no meds   High blood pressure    Hypercholesterolemia    Macrocytosis without anemia    Multiple myeloma not having achieved remission (Wesley [name]) 09/26/2021   OSA on CPAP    Paraesophageal hernia    Partial bowel obstruction (HCC)    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  PT denies significant findings   CARDIAC CATHETERIZATION  2008   COLOSTOMY N/A 10/31/2019   Procedure: COLOSTOMY;  Surgeon: Erroll Luna, MD;  Location: [facility];  Service: General;  Laterality: N/A;   CYSTOSCOPY W/ URETERAL STENT PLACEMENT Bilateral 10/31/2019   Procedure: CYSTOSCOPY URETERAL STENT PLACEMENT BILATERAL;  Surgeon: Franchot Gallo, MD;  Location: [facility];  Service: Urology;  Laterality: Bilateral;   CYSTOSCOPY WITH STENT PLACEMENT  Bilateral 03/11/2020   Procedure: CYSTOSCOPY WITH BILATERAL FIREFLY INJECTION;  Surgeon: Irine Seal, MD;  Location: [facility];  Service: Urology;  Laterality: Bilateral;   EYE SURGERY     BILATERAL CATARACT SURGERY WITH LENS IMPLANTS   FLEXIBLE SIGMOIDOSCOPY N/A 10/29/2019   Procedure: FLEXIBLE SIGMOIDOSCOPY;  Surgeon: Jerene Bears, MD;  Location: [facility];  Service: Gastroenterology;  Laterality: N/A;   INCISIONAL HERNIA REPAIR N/A 10/13/2020   Procedure: OPEN INCISIONAL HERNIA REPAIR WITH MESH;  Surgeon: [name], MD;  Location: [place];  Service: General;  Laterality: N/A;   INSERTION OF MESH N/A 09/08/2016   Procedure: INSERTION OF MESH;  Surgeon: Jackolyn Confer, MD;  Location: [facility];  Service: General;  Laterality: N/A;   IR RADIOLOGIST EVAL & MGMT  11/10/2020   IR RADIOLOGIST EVAL & MGMT  11/25/2020   LAPAROSCOPIC SIGMOID COLECTOMY N/A 10/31/2019   Procedure: DIAGNOSTIC LAPAROSCOPY; EXPLORATORY LAPAROTOMY; SIGMOID COLECTOMY;  Surgeon: Erroll Luna, MD;  Location: [facility];  Service: General;  Laterality: N/A;   right rotator cuff     SUBMUCOSAL TATTOO INJECTION  10/29/2019   Procedure: SUBMUCOSAL TATTOO INJECTION;  Surgeon: Jerene Bears, MD;  [facility]: [facility];  Service: Gastroenterology;;   TONSILLECTOMY     UMBILICAL HERNIA REPAIR N/A 09/08/2016   Procedure: OPEN UMBILICAL HERNIA REPAIR WITH MESH;  Surgeon: Jackolyn Confer, MD;  Location: [facility];  Service: General;  Laterality: N/A;   VENTRAL HERNIA REPAIR N/A 02/03/2021   Procedure: LAPAROSCOPIC VENTRAL HERNIA REPAIR WITH MESH;  Surgeon: [name], MD;  Location: [place];  Service: General;  Laterality: N/A;   XI ROBOTIC ASSISTED COLOSTOMY TAKEDOWN N/A 03/11/2020   Procedure: ROBOTIC ASSISTED COLOSTOMY REVERSAL, RIGID PROCTOSCOPY;  Surgeon: Leighton Ruff, MD;  Location: [facility];  Service: General;  Laterality: N/A;    SOCIAL HISTORY: Social History   Socioeconomic History   Marital status: Married    Spouse name:  Rubel Heckard    Number of children: 2   Years of education: 12   Highest education level: Master's degree (e.g., MA, MS, MEng, MEd, MSW, MBA)  Occupational History   Occupation: Retired   Tobacco Use   Smoking status: Never   Smokeless tobacco: Never  Vaping Use   Vaping Use: Never used  Substance and Sexual Activity   Alcohol use: Yes    Alcohol/week: 17.0 standard drinks of alcohol    Types: 3 Glasses of wine, 14 Standard drinks or equivalent per week   Drug use: No   Sexual activity: Not Currently  Other Topics Concern   Not on file  Social History Narrative   Married. 2 children 52 in 64 in 2022- both went to [employer] [facility] [employer] [facility]. 5 grandkids- all girls from oldest 30 looking premed [employer], 52 soph [employer], 75 35 year olds, 2 year olds.       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 [employer]: fishing, [place] and basketball, [place],  yardwork   Social Determinants of Health   Financial Resource Strain: Low Risk  (05/07/2021)   Overall Financial Resource Strain (CARDIA)    Difficulty of Paying Living Expenses: Not hard at all  Food Insecurity: No Food Insecurity (05/07/2021)   Hunger Vital Sign    Worried About Running Out of Food in the Last Year: Never true    Ran Out of Food in the Last Year: Never true  Transportation Needs: No Transportation Needs (05/07/2021)   PRAPARE - Hydrologist (Medical): No    Lack of Transportation (Non-Medical): No  Physical Activity: Inactive (05/07/2021)   Exercise Vital Sign    Days of Exercise per Week: 0 days    Minutes of Exercise per Session: 0 min  Stress: No Stress Concern Present (05/07/2021)   [place]    Feeling of Stress : Not at all  Social Connections: [place] (05/07/2021)   Social Connection and  Isolation Panel [NHANES]    Frequency of Communication with Friends and Family: More than three times a week    Frequency of Social Gatherings with Friends and Family: Once a week    Attends Religious Services: More than 4 times per year    Active Member of [employer] or Organizations: Yes    Attends Music therapist: More than 4 times per year    Marital Status: Married  Human resources officer Violence: Not At Risk (05/07/2021)   Humiliation, Afraid, Rape, and Kick questionnaire    Fear of Current or Ex-Partner: No    Emotionally Abused: No    Physically Abused: No    Sexually Abused: No    FAMILY HISTORY: Family History  Problem Relation Age of Onset   Diabetes Mother    Heart failure Mother        around 35   CAD Father        2   Melanoma Brother 90   Healthy Daughter    Healthy Son    Colon cancer Neg Hx    Esophageal cancer Neg Hx    Rectal cancer Neg Hx    Stomach cancer Neg Hx     ALLERGIES:  is allergic to demerol, promethazine hcl, and ativan [lorazepam].  MEDICATIONS:  Current Outpatient Medications  Medication Sig Dispense Refill   acetaminophen (TYLENOL) 500 MG tablet Take 500 mg by mouth every 6 (six) hours as needed.     acyclovir (ZOVIRAX) 400 MG tablet Take 400 mg by mouth in the morning and at bedtime.     allopurinol (ZYLOPRIM) 300 MG tablet TAKE ONE TABLET BY MOUTH DAILY 60 tablet 1   aspirin EC 81 MG tablet Take 81 mg by mouth daily. Swallow whole.     atorvastatin (LIPITOR) 80 MG tablet Take 1 tablet by mouth daily.     Cholecalciferol (VITAMIN D3) 25 MCG (1000 UT) CAPS Take 1,000 Units by mouth daily.     dexamethasone (DECADRON) 4 MG tablet Take 5 tablets (20 mg total) by mouth once a week. 20 tablet 3   fexofenadine (ALLEGRA) 180 MG tablet      finasteride (PROSCAR) 5 MG tablet Take 5 mg by mouth daily.     fluticasone (FLONASE) 50 MCG/ACT nasal spray Place into the nose.     Ibuprofen 200 MG CAPS      lenalidomide (REVLIMID) 25 MG capsule  Take 1 capsule (25 mg total) by mouth daily. Take for 21 days, none  for 7 days. Repeat every 28 days. [employer] Auth # [id number] obtained 04/12/22 21 capsule 0   lisinopril (ZESTRIL) 10 MG tablet Take 1 tablet (10 mg total) by mouth daily. 90 tablet 3   Magnesium Oxide 400 MG CAPS      melatonin 5 MG TABS      Omega-3 Fatty Acids (FISH OIL) 1200 MG CAPS Take 1,200 mg by mouth daily.     ondansetron (ZOFRAN) 8 MG tablet Take 1 tablet (8 mg total) by mouth every 8 (eight) hours as needed. 30 tablet 0   pantoprazole (PROTONIX) 40 MG tablet Take by mouth.     prochlorperazine (COMPAZINE) 10 MG tablet Take 1 tablet (10 mg total) by mouth every 6 (six) hours as needed for nausea or vomiting. 30 tablet 0   vitamin B-12 (CYANOCOBALAMIN) 1000 MCG tablet Take 1,000 mcg by mouth daily.     No current facility-administered medications for this visit.    REVIEW OF SYSTEMS:   Constitutional: ( - ) fevers, ( - )  chills , ( - ) night sweats Eyes: ( - ) blurriness of vision, ( - ) double vision, ( - ) watery eyes Ears, nose, mouth, throat, and face: ( - ) mucositis, ( - ) sore throat Respiratory: ( - ) cough, ( - ) dyspnea, ( - ) wheezes Cardiovascular: ( - ) palpitation, ( - ) chest discomfort, ( - ) lower extremity swelling Gastrointestinal:  ( - ) nausea, ( - ) heartburn, ( - ) change in bowel habits Skin: ( - ) abnormal skin rashes Lymphatics: ( - ) new lymphadenopathy, ( - ) easy bruising Neurological: ( + ) numbness, ( - ) tingling, ( - ) new weaknesses Behavioral/Psych: ( - ) mood change, ( - ) new changes  All other systems were reviewed with the patient and are negative.  PHYSICAL EXAMINATION: ECOG PERFORMANCE STATUS: 1 - Symptomatic but completely ambulatory  Vitals:   04/14/22 0924  BP: 125/78  Pulse: 76  Resp: 15  Temp: 97.9 F (36.6 C)  SpO2: 98%    Filed Weights   04/14/22 0924  Weight: 194 lb 14.4 oz (88.4 kg)     GENERAL: Well-appearing elderly Caucasian male, alert, no  distress and comfortable SKIN: skin color, texture, turgor are normal, no rashes or significant lesions EYES: conjunctiva are pink and non-injected, sclera clear LUNGS: clear to auscultation and percussion with normal breathing effort HEART: regular rate & rhythm and no murmurs and no lower extremity edema ABDOMEN: Packed wound sites, no erythema or discharged noted.  Musculoskeletal: no cyanosis of digits and no clubbing  PSYCH: alert & oriented x 3, fluent speech NEURO: no focal motor/sensory deficits  LABORATORY DATA:  I have reviewed the data as listed    Latest Ref Rng & Units 04/14/2022    8:57 AM 03/17/2022    1:05 PM 03/03/2022   12:57 PM  CBC  WBC 4.0 - 10.5 K/uL 3.3  5.4  5.7   Hemoglobin 13.0 - 17.0 g/dL 13.9  13.8  14.0   Hematocrit 39.0 - 52.0 % 39.4  40.1  40.0   Platelets 150 - 400 K/uL 169  131  220        Latest Ref Rng & Units 04/14/2022    8:57 AM 03/17/2022    1:05 PM 03/03/2022   12:57 PM  CMP  Glucose 70 - 99 mg/dL 112  138  117   BUN 8 - 23 mg/dL 14  16  17  Creatinine 0.61 - 1.24 mg/dL 1.00  0.96  0.90   Sodium 135 - 145 mmol/L 143  138  138   Potassium 3.5 - 5.1 mmol/L 3.9  4.6  4.5   Chloride 98 - 111 mmol/L 108  105  107   CO2 22 - 32 mmol/L _0 Calcium 8.9 - 10.3 mg/dL 9.1  8.6  8.9   Total Protein 6.5 - 8.1 g/dL 6.3  6.6  6.1   Total Bilirubin 0.3 - 1.2 mg/dL 0.7  0.7  0.9   Alkaline Phos 38 - 126 U/L 91  96  78   AST 15 - 41 U/L _1 ALT 0 - 44 U/L _2 Lab Results  Component Value Date   MPROTEIN 0.2 (H) 03/17/2022   MPROTEIN 0.2 (H) 02/17/2022   MPROTEIN 0.2 (H) 01/20/2022   Lab Results  Component Value Date   KPAFRELGTCHN 10.2 03/17/2022   KPAFRELGTCHN 10.8 02/17/2022   KPAFRELGTCHN 11.5 01/20/2022   LAMBDASER 6.6 03/17/2022   LAMBDASER 5.9 02/17/2022   LAMBDASER 5.7 01/20/2022   KAPLAMBRATIO 1.55 03/17/2022   KAPLAMBRATIO 1.83 (H) 02/17/2022   KAPLAMBRATIO 2.02 (H) 01/20/2022    RADIOGRAPHIC  STUDIES: MR BRAIN W WO CONTRAST  Result Date: 04/14/2022 CLINICAL DATA:  History of right eighth cranial nerve schwannoma EXAM: MRI HEAD WITHOUT AND WITH CONTRAST TECHNIQUE: Multiplanar, multiecho pulse sequences of the brain and surrounding structures were obtained without and with intravenous contrast. CONTRAST:  29m GADAVIST GADOBUTROL 1 MMOL/ML IV SOLN COMPARISON:  Brain MRI 10/19/2021 FINDINGS: Brain: The 6 mm AP x 9 mm TV by 5 mm cc enhancing lesion in the right IAC is unchanged. There is no cochlear extension. There is no acute intracranial hemorrhage, extra-axial fluid collection, or acute infarct. Parenchymal volume is stable. The ventricles are stable in size. Patchy FLAIR signal abnormality in the supratentorial white matter likely reflecting sequela of mild chronic small vessel ischemic change is stable. 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 There is no new mass lesion or abnormal enhancement. There is no mass effect or midline shift. Vascular: Normal flow voids. Skull and upper cervical spine: Normal marrow signal. Sinuses/Orbits: The paranasal sinuses are clear. The globes and orbits are unremarkable. Other: None. IMPRESSION: Stable right vestibular schwannoma since 10/19/2021. Electronically Signed   By: PValetta MoleM.D.   On: 04/14/2022 09:33    ASSESSMENT & PLAN  Wesley SCHREINER89y.o. male with medical history significant for IgG kappa multiple myeloma who presents for a follow up visit.   The patient meets diagnostic criteria by having greater than 60% plasma cells in his bone marrow.  At this time [facility] Rev[facility], and dexamethasone as the treatment of choice.   Previously we discussed the concept of comanaged care.  Comanaged care is when the patient has a local primary provider who administers local support and therapy while expert advice and treatment recommendations are rendered  by a cancer specialist at a large academic center.  In this arrangement we provide local support, labs, treatment, and emergency visits, however the major decisions regarding the course of treatment are decided by a physician at an academic medical center.  The patient voiced his understanding of comanaged care and was agreeable to proceeding forward with this care model.   The regimen of choice for this  patient will be Dara, Revlimid, and dexamethasone.  We will start with weekly treatments for the first 2 cycles.  After that for an additional 4 cycles he will be getting it every other week.  After cycle 6 the patient will then continue with monthly treatment.    # IgG Kappa Multiple Myeloma --Patient meets diagnostic criteria for multiple myeloma based on the presence of 60% plasma cells in the bone marrow --Pretreatment phenotype RBC as well as type and screen ordered  --Cycle 1 Day 1of Dara/Rev/Dex started on 09/30/2021. He started without revlimid which was added on 10/06/2021 (Cycle 1 Day 8)  -- Labs to include weekly CBC, CMP, LDH.  Additionally monthly SPEP and serum free light chains. Plan:  --Today is Cycle 8 Day 1 of Dara/Rev/Dex --Labs today were reviewed and adequate for treatment.  Labs show white blood cell count 3.3, hemoglobin 13.9, MCV 102.9, and platelets of 169 -- Return to clinic in 4 weeks prior to Cycle 9 Day 1 of Dara/Rev/Dex.   #Lethargy 2/2 Zometa:  --Discussed alternative which would be Xgeva q 28 days.  -- We will plan to start Xgeva at his next visit.   # Boils on Abdomen -- Patient underwent I&D and subsequent antibiotic treatment with no resolution.  2 new lesions have developed at the site of his prior ostomy --Patient was seen by surgery who ordered CT scan on 01/31/2022 that showed no fluid collection in the anterior abdominal wall. Instructed patient on how to pack wounds.  --No systemic symptoms, okay to continue with systemic chemotherapy.  #Macrocytic  anemia: #Leukopenia # Thrombocytopenia --WBC 3.3, Hgb is 13.9, MCV 102.9, Plt 169 --Levels fluctuating with treatment.  #Neuropathy:  --Stable and does not interfere with grip or balance --Continue to monitor.    #Supportive Care -- chemotherapy education complete  -- port placement not required.  --ASA 81 mg p.o. daily for thromboprophylaxis on Revlimid. -- zofran 32m q8H PRN and compazine 138mPO q6H for nausea -- acyclovir 40083mO BID for VCZ prophylaxis -- allopurinol 300m34m daily for TLS prophylaxis -- zometa clearance obtained, started on 12/09/2021. Next dose in July 2023.  -- no pain medication required at this time.   No orders of the defined types were placed in this encounter.  All questions were answered. The patient knows to call the clinic with any problems, questions or concerns.  I have spent a total of 30 minutes minutes of face-to-face and non-face-to-face time, preparing to see the patient, performing a medically appropriate examination, counseling and educating the patient, ordering medications/tests, documenting clinical information in the electronic health record,  and care coordination.   JohnLedell Peoples Department of Hematology/Oncology [facility]: [phone]: [phone]: [email or web address]  04/17/2022 3:58 PM

## 2022-04-16 ENCOUNTER — Other Ambulatory Visit: Payer: Self-pay | Admitting: Hematology and Oncology

## 2022-04-16 DIAGNOSIS — C9000 Multiple myeloma not having achieved remission: Secondary | ICD-10-CM

## 2022-04-16 NOTE — Progress Notes (Signed)
ON PATHWAY REGIMEN - Multiple Myeloma and Other Plasma Cell Dyscrasias  No Change  Continue With Treatment as Ordered.  Original Decision Date/Time: 09/26/2021 14:11     Cycles 1 and 2: A cycle is every 28 days:     Lenalidomide      Dexamethasone      Daratumumab and hyaluronidase-fihj    Cycles 3 through 6: A cycle is every 28 days:     Lenalidomide      Dexamethasone      Daratumumab and hyaluronidase-fihj    Cycles 7 and beyond: A cycle is every 28 days:     Lenalidomide      Dexamethasone      Daratumumab and hyaluronidase-fihj   **Always confirm dose/schedule in your pharmacy ordering system**  Patient Characteristics: Multiple Myeloma, Newly Diagnosed, Transplant Ineligible or Refused, Unknown or Awaiting Test Results Disease Classification: Multiple Myeloma R-ISS Staging: II Therapeutic Status: Newly Diagnosed Is Patient Eligible for Transplant<= Transplant Ineligible or Refused Risk Status: Awaiting Test Results Intent of Therapy: Curative Intent, Discussed with Patient

## 2022-04-17 ENCOUNTER — Encounter: Payer: Self-pay | Admitting: Hematology and Oncology

## 2022-04-18 ENCOUNTER — Telehealth: Payer: Self-pay | Admitting: Hematology and Oncology

## 2022-04-18 LAB — MULTIPLE MYELOMA PANEL, SERUM
Albumin SerPl Elph-Mcnc: 3.4 g/dL (ref 2.9–4.4)
Albumin/Glob SerPl: 1.5 (ref 0.7–1.7)
Alpha 1: 0.3 g/dL (ref 0.0–0.4)
Alpha2 Glob SerPl Elph-Mcnc: 0.7 g/dL (ref 0.4–1.0)
B-Globulin SerPl Elph-Mcnc: 0.8 g/dL (ref 0.7–1.3)
Gamma Glob SerPl Elph-Mcnc: 0.5 g/dL (ref 0.4–1.8)
Globulin, Total: 2.3 g/dL (ref 2.2–3.9)
IgA: 92 mg/dL (ref 61–437)
IgG (Immunoglobin G), Serum: 441 mg/dL — ABNORMAL LOW (ref 603–1613)
IgM (Immunoglobulin M), Srm: 24 mg/dL (ref 15–143)
M Protein SerPl Elph-Mcnc: 0.1 g/dL — ABNORMAL HIGH
Total Protein ELP: 5.7 g/dL — ABNORMAL LOW (ref 6.0–8.5)

## 2022-04-18 LAB — KAPPA/LAMBDA LIGHT CHAINS
Kappa free light chain: 11.1 mg/L (ref 3.3–19.4)
Kappa, lambda light chain ratio: 1.37 (ref 0.26–1.65)
Lambda free light chains: 8.1 mg/L (ref 5.7–26.3)

## 2022-04-18 NOTE — Telephone Encounter (Signed)
Per workque called and spoke to pt about appointments  pt confirmed appointments

## 2022-04-19 ENCOUNTER — Other Ambulatory Visit: Payer: Self-pay

## 2022-04-21 DIAGNOSIS — C9000 Multiple myeloma not having achieved remission: Secondary | ICD-10-CM | POA: Diagnosis not present

## 2022-04-21 DIAGNOSIS — L7682 Other postprocedural complications of skin and subcutaneous tissue: Secondary | ICD-10-CM | POA: Diagnosis not present

## 2022-04-21 DIAGNOSIS — Z9889 Other specified postprocedural states: Secondary | ICD-10-CM | POA: Diagnosis not present

## 2022-04-21 DIAGNOSIS — Z8719 Personal history of other diseases of the digestive system: Secondary | ICD-10-CM | POA: Diagnosis not present

## 2022-04-24 ENCOUNTER — Inpatient Hospital Stay (HOSPITAL_BASED_OUTPATIENT_CLINIC_OR_DEPARTMENT_OTHER): Payer: Medicare Other | Admitting: Internal Medicine

## 2022-04-24 ENCOUNTER — Inpatient Hospital Stay: Payer: Medicare Other

## 2022-04-24 ENCOUNTER — Other Ambulatory Visit: Payer: Self-pay

## 2022-04-24 ENCOUNTER — Other Ambulatory Visit: Payer: Self-pay | Admitting: Cardiology

## 2022-04-24 VITALS — BP 139/72 | HR 89 | Temp 98.1°F | Resp 17 | Ht 68.0 in | Wt 195.7 lb

## 2022-04-24 DIAGNOSIS — C9000 Multiple myeloma not having achieved remission: Secondary | ICD-10-CM | POA: Diagnosis not present

## 2022-04-24 DIAGNOSIS — D333 Benign neoplasm of cranial nerves: Secondary | ICD-10-CM | POA: Diagnosis not present

## 2022-04-24 DIAGNOSIS — Z79899 Other long term (current) drug therapy: Secondary | ICD-10-CM | POA: Diagnosis not present

## 2022-04-24 DIAGNOSIS — Z5111 Encounter for antineoplastic chemotherapy: Secondary | ICD-10-CM | POA: Diagnosis not present

## 2022-04-24 MED ORDER — DEXAMETHASONE 4 MG PO TABS: ORAL_TABLET | ORAL | 1 refills | Status: DC

## 2022-04-24 NOTE — Progress Notes (Signed)
[facility] [place], [place] [address] [phone]   Interval Evaluation  Date of Service: 04/24/22 Patient Name: Wesley [name] Patient MRN: [id number] Patient DOB: 25-Aug-1940 Provider: Ventura Sellers, MD  Identifying Statement:  Wesley [name] is a 81 y.o. male with  right   vestibular schwannoma    CNS Oncologic History 04/04/21: MRI demonstrates small R vestibular schwannoma after provoked fall  Interval History: Wesley [name] presents today for follow up after recent MRI brain.  No new or progressive neurologic changes described today, although he does describe mild daily headache for past 4-3 months.  He has been dosing ibuprofen 3x per day over that time period.  No changes in his hearing or recurrence of vertigo.  Continues on myeloma rx (dara and revlimid) with Dr. Lorenso Courier.   H+P (04/19/21) Patient presented to medical attention on 8/22 after fall and head trauma, provoked by tripping over his dogs.  He hit the side of his head on some furniture; next day developed vertigo symptoms, CNS imaging subsequently demonstrated a right sided vestibular schwannoma.  Since then, headaches have resolved, but he still experiences "sometimes intense" vertigo upon sitting up or laying down, duration is "seconds to a minute" and it does not persist between episodes.  Otherwise denies double vision, facial weakness or numbness.  Functionally intact and independent at home.  Medications: Current Outpatient Medications on File Prior to Visit  Medication Sig Dispense Refill   acetaminophen (TYLENOL) 500 MG tablet Take 500 mg by mouth every 6 (six) hours as needed.     acyclovir (ZOVIRAX) 400 MG tablet Take 400 mg by mouth in the morning and at bedtime.     allopurinol (ZYLOPRIM) 300 MG tablet TAKE ONE TABLET BY MOUTH DAILY 60 tablet 1   aspirin EC 81 MG tablet Take 81 mg by mouth daily. Swallow whole.     atorvastatin (LIPITOR) 80 MG  tablet Take 1 tablet by mouth daily.     Cholecalciferol (VITAMIN D3) 25 MCG (1000 UT) CAPS Take 1,000 Units by mouth daily.     dexamethasone (DECADRON) 4 MG tablet Take 5 tablets (20 mg total) by mouth once a week. 20 tablet 3   fexofenadine (ALLEGRA) 180 MG tablet      finasteride (PROSCAR) 5 MG tablet Take 5 mg by mouth daily.     fluticasone (FLONASE) 50 MCG/ACT nasal spray Place into the nose.     Ibuprofen 200 MG CAPS      lenalidomide (REVLIMID) 25 MG capsule Take 1 capsule (25 mg total) by mouth daily. Take for 21 days, none for 7 days. Repeat every 28 days. [employer] Auth # [id number] obtained 04/12/22 21 capsule 0   lisinopril (ZESTRIL) 10 MG tablet Take 1 tablet (10 mg total) by mouth daily. 90 tablet 3   Magnesium Oxide 400 MG CAPS      melatonin 5 MG TABS      Omega-3 Fatty Acids (FISH OIL) 1200 MG CAPS Take 1,200 mg by mouth daily.     ondansetron (ZOFRAN) 8 MG tablet Take 1 tablet (8 mg total) by mouth every 8 (eight) hours as needed. 30 tablet 0   pantoprazole (PROTONIX) 40 MG tablet Take by mouth.     prochlorperazine (COMPAZINE) 10 MG tablet Take 1 tablet (10 mg total) by mouth every 6 (six) hours as needed for nausea or vomiting. 30 tablet 0   vitamin B-12 (CYANOCOBALAMIN) 1000 MCG tablet Take 1,000  mcg by mouth daily.     No current facility-administered medications on file prior to visit.    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)    CAD (coronary artery disease)    Cholelithiasis    Chronic allergic rhinitis    Coronary atherosclerosis of native coronary artery    Fatty liver    GERD (gastroesophageal reflux disease)    HX 50yr ago, no longer a problem, no meds    High blood pressure    Hypercholesterolemia    Macrocytosis without anemia    Multiple myeloma not having achieved remission ([facility] 09/26/2021   OSA on CPAP    Paraesophageal hernia    Partial bowel obstruction ([facility]    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 PT denies significant findings   CARDIAC CATHETERIZATION  2008   COLOSTOMY N/A 10/31/2019   Procedure: COLOSTOMY;  Surgeon: CErroll Luna MD;  Location: [facility];  Service: General;  Laterality: N/A;   CYSTOSCOPY W/ URETERAL STENT PLACEMENT Bilateral 10/31/2019   Procedure: CYSTOSCOPY URETERAL STENT PLACEMENT BILATERAL;  Surgeon: DFranchot Gallo MD;  Location: [facility];  Service: Urology;  Laterality: Bilateral;   CYSTOSCOPY WITH STENT PLACEMENT Bilateral 03/11/2020   Procedure: CYSTOSCOPY WITH BILATERAL FIREFLY INJECTION;  Surgeon: WIrine Seal MD;  Location: [facility];  Service: Urology;  Laterality: Bilateral;   EYE SURGERY     BILATERAL CATARACT SURGERY WITH LENS IMPLANTS   FLEXIBLE SIGMOIDOSCOPY N/A 10/29/2019   Procedure: FLEXIBLE SIGMOIDOSCOPY;  Surgeon: PJerene Bears MD;  [facility];  Service: Gastroenterology;  Laterality: N/A;   INCISIONAL HERNIA REPAIR N/A 10/13/2020   Procedure: OPEN INCISIONAL HERNIA REPAIR WITH MESH;  Surgeon: RRalene Ok MD;  [facility]  Service: General;  Laterality: N/A;   INSERTION OF MESH N/A 09/08/2016   Procedure: INSERTION OF MESH;  Surgeon: TJackolyn Confer MD;  [facility];  Service: General;  Laterality: N/A;   IR RADIOLOGIST EVAL & MGMT  11/10/2020   IR RADIOLOGIST EVAL & MGMT  11/25/2020   LAPAROSCOPIC SIGMOID COLECTOMY N/A 10/31/2019    Procedure: DIAGNOSTIC LAPAROSCOPY; EXPLORATORY LAPAROTOMY; SIGMOID COLECTOMY;  Surgeon: CErroll Luna MD;  [facility];  Service: General;  Laterality: N/A;   right rotator cuff     SUBMUCOSAL TATTOO INJECTION  10/29/2019   Procedure: SUBMUCOSAL TATTOO INJECTION;  Surgeon: PJerene Bears MD;  [facility];  Service: Gastroenterology;;   TONSILLECTOMY     UMBILICAL HERNIA REPAIR N/A 09/08/2016   Procedure: OPEN UMBILICAL HERNIA REPAIR WITH MESH;  Surgeon: TJackolyn Confer MD;  [facility];  Service: General;  Laterality: N/A;   VENTRAL HERNIA REPAIR N/A 02/03/2021   Procedure: LAPAROSCOPIC VENTRAL HERNIA REPAIR WITH MESH;  Surgeon: RRalene Ok MD;  [facility]  Service: General;  Laterality: N/A;   XI ROBOTIC  ASSISTED COLOSTOMY TAKEDOWN N/A 03/11/2020   Procedure: ROBOTIC ASSISTED COLOSTOMY REVERSAL, RIGID PROCTOSCOPY;  Surgeon: Leighton Ruff, MD;  [facility];  Service: General;  Laterality: N/A;   Social History:  Social History   Socioeconomic History   Marital status: Married    Spouse name: Colton Tassin    Number of children: 2   Years of education: 12   Highest education level: Master's degree (e.g., MA, MS, MEng, MEd, MSW, MBA)  Occupational History   Occupation: Retired   Tobacco Use   Smoking status: Never   Smokeless tobacco: Never  Vaping Use   Vaping Use: Never used  Substance and Sexual Activity   Alcohol use: Yes    Alcohol/week: 17.0 standard drinks of alcohol    Types: 3 Glasses of wine, 14 Standard drinks or equivalent per week   Drug use: No   Sexual activity: Not Currently  Other Topics Concern   Not on file  Social History Narrative   Married. 2 children 52 in 30 in 2022- both went to [facility]. 5 grandkids- all girls from oldest 67 looking premed [employer], 62 soph [employer], 65 76 year olds, 76 year olds.       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 [employer]: fishing, [employer] football and basketball, [employer], [place] Strain: [place]  (05/07/2021)   Overall Financial Resource Strain ([place])    Difficulty of Paying Living Expenses: Not hard at all  Food Insecurity: No [place] (05/07/2021)   Hunger Vital Sign    Worried About [place] in the Last Year: Never true    [place] in the Last Year: Never true  Transportation Needs: No Transportation Needs (05/07/2021)   PRAPARE - Hydrologist (Medical): No    Lack of Transportation (Non-Medical): No  Physical Activity: Inactive (05/07/2021)   Exercise Vital Sign    Days of Exercise per Week: 0 days    Minutes of Exercise per Session: 0 min  Stress: No Stress Concern Present (05/07/2021)   [place]    Feeling of Stress : Not at all  Social Connections: [place] (05/07/2021)   Social Connection and Isolation Panel [NHANES]    Frequency of Communication with Friends and Family: More than three times a week    Frequency of Social Gatherings with Friends and Family: Once a week    Attends Religious Services: More than 4 times per year    Active Member of [employer] or Organizations: Yes    Attends Music therapist: More than 4 times per year    Marital Status: Married  Human resources officer Violence: Not At Risk (05/07/2021)   Humiliation, Afraid, Rape, and Kick questionnaire    Fear of Current or Ex-Partner: No    Emotionally Abused: No    Physically Abused: No    Sexually Abused: No   Family History:  Family History  Problem Relation Age of Onset   Diabetes Mother    Heart failure Mother        around 70   CAD Father        42   Melanoma Brother 63   Healthy Daughter    Healthy Son  Colon cancer Neg Hx    Esophageal cancer Neg Hx    Rectal  cancer Neg Hx    Stomach cancer Neg Hx     Review of Systems: Constitutional: Doesn't report fevers, chills or abnormal weight loss Eyes: Doesn't report blurriness of vision Ears, nose, mouth, throat, and face: Doesn't report sore throat Respiratory: Doesn't report cough, dyspnea or wheezes Cardiovascular: Doesn't report palpitation, chest discomfort  Gastrointestinal:  Doesn't report nausea, constipation, diarrhea GU: Doesn't report incontinence Skin: Doesn't report skin rashes Neurological: Per HPI Musculoskeletal: Doesn't report joint pain Behavioral/Psych: Doesn't report anxiety  Physical Exam: Vitals:   04/24/22 1105  BP: 139/72  Pulse: 89  Resp: 17  Temp: 98.1 F (36.7 C)  SpO2: 95%    KPS: 90. General: Alert, cooperative, pleasant, in no acute distress Head: Normal EENT: No conjunctival injection or scleral icterus.  Lungs: Resp effort normal Cardiac: Regular rate Abdomen: Non-distended abdomen Skin: No rashes cyanosis or petechiae. Extremities: No clubbing or edema  Neurologic Exam: Mental Status: Awake, alert, attentive to examiner. Oriented to self and environment. Language is fluent with intact comprehension.  Cranial Nerves: Visual acuity is grossly normal. Visual fields are full. Extra-ocular movements intact. No ptosis. Face is symmetric Motor: Tone and bulk are normal. Power is full in both arms and legs. Reflexes are symmetric, no pathologic reflexes present.  Sensory: Intact to light touch Gait: Normal.   Labs: I have reviewed the data as listed    Component Value Date/Time   NA 143 04/14/2022 0857   K 3.9 04/14/2022 0857   CL 108 04/14/2022 0857   CO2 28 04/14/2022 0857   GLUCOSE 112 (H) 04/14/2022 0857   BUN 14 04/14/2022 0857   CREATININE 1.00 04/14/2022 0857   CALCIUM 9.1 04/14/2022 0857   PROT 6.3 (L) 04/14/2022 0857   PROT 10.4 (HH) 08/01/2021 1117   ALBUMIN 4.1 04/14/2022 0857   AST 21 04/14/2022 0857   ALT 26 04/14/2022 0857    ALKPHOS 91 04/14/2022 0857   BILITOT 0.7 04/14/2022 0857   GFRNONAA >60 04/14/2022 0857   GFRAA >60 03/14/2020 0553   Lab Results  Component Value Date   WBC 3.3 (L) 04/14/2022   NEUTROABS 1.2 (L) 04/14/2022   HGB 13.9 04/14/2022   HCT 39.4 04/14/2022   MCV 102.9 (H) 04/14/2022   PLT 169 04/14/2022   Audiology: 06/27/21 The hearing evaluation revealed 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 In the right ear, mild loss sloped to severe loss with the exception of WNL hearing sensitivity for 1000 and 2000 Hz. Slight mixed component in the low frequencies. In the left ear, thresholds were WNL from 250- 3000 Hz, then mild to moderate loss for 4000, 6000, and 8000 Hz. Results were obtained using headphones then inserts.  Imaging:  [place] Clinician Interpretation: I have personally reviewed the CNS images as listed.  My interpretation, in the context of the patient's clinical presentation, is progressive disease  MR BRAIN W WO CONTRAST  Result Date: 04/14/2022 CLINICAL DATA:  History of right eighth cranial nerve schwannoma EXAM: MRI HEAD WITHOUT AND WITH CONTRAST TECHNIQUE: Multiplanar, multiecho pulse sequences of the brain and surrounding structures were obtained without and with intravenous contrast. CONTRAST:  17m GADAVIST GADOBUTROL 1 MMOL/ML IV SOLN COMPARISON:  Brain MRI 10/19/2021 FINDINGS: Brain: The 6 mm AP x 9 mm TV by 5 mm cc enhancing lesion in the right IAC is unchanged. There is no cochlear extension. There is no acute intracranial hemorrhage, extra-axial fluid collection,  or acute infarct. Parenchymal volume is stable. The ventricles are stable in size. Patchy FLAIR signal abnormality in the supratentorial white matter likely reflecting sequela of mild chronic small vessel ischemic change is stable. 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 There is no new mass lesion or abnormal enhancement. There is no mass effect or midline shift.  Vascular: Normal flow voids. Skull and upper cervical spine: Normal marrow signal. Sinuses/Orbits: The paranasal sinuses are clear. The globes and orbits are unremarkable. Other: None. IMPRESSION: Stable right vestibular schwannoma since 10/19/2021. Electronically Signed   By: Valetta Mole M.D.   On: 04/14/2022 09:33    Assessment/Plan Vestibular schwannoma (HCC)  Wesley [name] is clinically stable with regards to hearing, vestibular function.  MRI brain demonstrates stable findings, after modest growth had been appreciated 6 months prior.  We recommended continued imaging surveillance, with repeat MRI brain in 12 months.  For headaches, we recommended short course of 47m daily decadron "bridge" to weaning of analgesia, 5-7 days, given medication overuse component.   He may also use 462mdaily decadron for several days after his monthly dara infusions, as headache tends to be severe during these intervals.    He will con't to follow up with regular audiology, ENT, as well as Dr. DoLorenso Courieror myeloma treatments.    All questions were answered. The patient knows to call the clinic with any problems, questions or concerns. No barriers to learning were detected.  The total time spent in the encounter was 30 minutes and more than 50% was on counseling and review of test results   ZaVentura SellersMD Medical Director of Neuro-Oncology [facility] We[facility]bald9/11/23 11:03 AM

## 2022-04-26 ENCOUNTER — Other Ambulatory Visit: Payer: Self-pay

## 2022-04-26 ENCOUNTER — Telehealth: Payer: Self-pay | Admitting: *Deleted

## 2022-04-26 ENCOUNTER — Telehealth: Payer: Self-pay | Admitting: Hematology and Oncology

## 2022-04-26 NOTE — Telephone Encounter (Signed)
Per 9/13 phone line pt r/s appointment   date picked by pt

## 2022-04-26 NOTE — Telephone Encounter (Signed)
Received call from pt requesting to have his appts rescheduled as he will be out of town for 2 weeks-leaving 05/05/22 and returning 05/20/21  Scheduling message sent to r/s starting the week of 05/22/22

## 2022-05-08 ENCOUNTER — Ambulatory Visit (INDEPENDENT_AMBULATORY_CARE_PROVIDER_SITE_OTHER): Payer: Medicare Other | Admitting: *Deleted

## 2022-05-08 ENCOUNTER — Encounter: Payer: Self-pay | Admitting: *Deleted

## 2022-05-08 DIAGNOSIS — Z0000 Encounter for general adult medical examination without abnormal findings: Secondary | ICD-10-CM | POA: Diagnosis not present

## 2022-05-08 NOTE — Patient Instructions (Signed)
Health Maintenance, Male Adopting a healthy lifestyle and getting preventive care are important in promoting health and wellness. Ask your health care provider about: The right schedule for you to have regular tests and exams. Things you can do on your own to prevent diseases and keep yourself healthy. What should I know about diet, weight, and exercise? Eat a healthy diet  Eat a diet that includes plenty of vegetables, fruits, low-fat dairy products, and lean protein. Do not eat a lot of foods that are high in solid fats, added sugars, or sodium. Maintain a healthy weight Body mass index (BMI) is a measurement that can be used to identify possible weight problems. It estimates body fat based on height and weight. Your health care provider can help determine your BMI and help you achieve or maintain a healthy weight. Get regular exercise Get regular exercise. This is one of the most important things you can do for your health. Most adults should: Exercise for at least 150 minutes each week. The exercise should increase your heart rate and make you sweat (moderate-intensity exercise). Do strengthening exercises at least twice a week. This is in addition to the moderate-intensity exercise. Spend less time sitting. Even light physical activity can be beneficial. Watch cholesterol and blood lipids Have your blood tested for lipids and cholesterol at 80 years of age, then have this test every 5 years. You may need to have your cholesterol levels checked more often if: Your lipid or cholesterol levels are high. You are older than 80 years of age. You are at high risk for heart disease. What should I know about cancer screening? Many types of cancers can be detected early and may often be prevented. Depending on your health history and family history, you may need to have cancer screening at various ages. This may include screening for: Colorectal cancer. Prostate cancer. Skin cancer. Lung  cancer. What should I know about heart disease, diabetes, and high blood pressure? Blood pressure and heart disease High blood pressure causes heart disease and increases the risk of stroke. This is more likely to develop in people who have high blood pressure readings or are overweight. Talk with your health care provider about your target blood pressure readings. Have your blood pressure checked: Every 3-5 years if you are 18-39 years of age. Every year if you are 40 years old or older. If you are between the ages of 65 and 75 and are a current or former smoker, ask your health care provider if you should have a one-time screening for abdominal aortic aneurysm (AAA). Diabetes Have regular diabetes screenings. This checks your fasting blood sugar level. Have the screening done: Once every three years after age 45 if you are at a normal weight and have a low risk for diabetes. More often and at a younger age if you are overweight or have a high risk for diabetes. What should I know about preventing infection? Hepatitis B If you have a higher risk for hepatitis B, you should be screened for this virus. Talk with your health care provider to find out if you are at risk for hepatitis B infection. Hepatitis C Blood testing is recommended for: Everyone born from 1945 through 1965. Anyone with known risk factors for hepatitis C. Sexually transmitted infections (STIs) You should be screened each year for STIs, including gonorrhea and chlamydia, if: You are sexually active and are younger than 80 years of age. You are older than 80 years of age and your   health care provider tells you that you are at risk for this type of infection. Your sexual activity has changed since you were last screened, and you are at increased risk for chlamydia or gonorrhea. Ask your health care provider if you are at risk. Ask your health care provider about whether you are at high risk for HIV. Your health care provider  may recommend a prescription medicine to help prevent HIV infection. If you choose to take medicine to prevent HIV, you should first get tested for HIV. You should then be tested every 3 months for as long as you are taking the medicine. Follow these instructions at home: Alcohol use Do not drink alcohol if your health care provider tells you not to drink. If you drink alcohol: Limit how much you have to 0-2 drinks a day. Know how much alcohol is in your drink. In the [place] one drink equals one 12 oz bottle of beer (355 mL), one 5 oz glass of Willis Kuipers (148 mL), or one 1 oz glass of hard liquor (44 mL). Lifestyle Do not use any products that contain nicotine or tobacco. These products include cigarettes, chewing tobacco, and vaping devices, such as e-cigarettes. If you need help quitting, ask your health care provider. Do not use street drugs. Do not share needles. Ask your health care provider for help if you need support or information about quitting drugs. General instructions Schedule regular health, dental, and eye exams. Stay current with your vaccines. Tell your health care provider if: You often feel depressed. You have ever been abused or do not feel safe at home. Summary Adopting a healthy lifestyle and getting preventive care are important in promoting health and wellness. Follow your health care provider's instructions about healthy diet, exercising, and getting tested or screened for diseases. Follow your health care provider's instructions on monitoring your cholesterol and blood pressure. This information is not intended to replace advice given to you by your health care provider. Make sure you discuss any questions you have with your health care provider. Document Revised: 12/20/2020 Document Reviewed: 12/20/2020 Elsevier Patient Education  2023 [employer]  

## 2022-05-08 NOTE — Progress Notes (Signed)
Subjective:   Wesley [name] is a 81 y.o. male who presents for Medicare Annual/Subsequent preventive examination. I connected with  Wesley [name] on 05/08/22 by a audio enabled telemedicine application and verified that I am speaking with the correct person using two identifiers.  Patient Location: Home  Provider Location: Home Office  I discussed the limitations of evaluation and management by telemedicine. The patient expressed understanding and agreed to proceed.  Review of Systems    Deferred to PCP Cardiac Risk Factors include: advanced age (>44mn, >>32women);dyslipidemia;hypertension;male gender     Objective:    Today's Vitals   05/08/22 1613  PainSc: 2    There is no height or weight on file to calculate BMI.     05/08/2022    4:26 PM 04/24/2022   11:11 AM 04/14/2022    9:43 AM 02/17/2022   12:05 PM 12/23/2021   11:43 AM 11/25/2021    9:26 AM 05/07/2021   11:29 AM  Advanced Directives  Does Patient Have a Medical Advance Directive? _0  No No  Would patient like information on creating a medical advance directive? No - Patient declined No - Patient declined No - Patient declined No - Patient declined No - Patient declined No - Patient declined No - Patient declined    Current Medications (verified) Outpatient Encounter Medications as of 05/08/2022  Medication Sig   acetaminophen (TYLENOL) 500 MG tablet Take 500 mg by mouth every 6 (six) hours as needed.   acyclovir (ZOVIRAX) 400 MG tablet Take 400 mg by mouth in the morning and at bedtime.   allopurinol (ZYLOPRIM) 300 MG tablet TAKE ONE TABLET BY MOUTH DAILY   aspirin EC 81 MG tablet Take 81 mg by mouth daily. Swallow whole.   atorvastatin (LIPITOR) 80 MG tablet Take 1 tablet (80 mg total) by mouth daily. Please call to schedule an overdue appointment with Dr. SMarlou Porchfor refills, [phone] thank you. 1st attempt.   Cholecalciferol (VITAMIN D3) 25 MCG (1000 UT) CAPS Take 1,000 Units by mouth daily.    dexamethasone (DECADRON) 4 MG tablet Take 5 tablets (20 mg total) by mouth once a week.   dexamethasone (DECADRON) 4 MG tablet Take 420mdaily in AM for 4 days following chemotherapy infusion days.   fexofenadine (ALLEGRA) 180 MG tablet    finasteride (PROSCAR) 5 MG tablet Take 5 mg by mouth daily.   fluticasone (FLONASE) 50 MCG/ACT nasal spray Place into the nose.   Ibuprofen 200 MG CAPS    lenalidomide (REVLIMID) 25 MG capsule Take 1 capsule (25 mg total) by mouth daily. Take for 21 days, none for 7 days. Repeat every 28 days. [employer] Auth # [id number] 04/12/22   lisinopril (ZESTRIL) 10 MG tablet Take 1 tablet (10 mg total) by mouth daily.   Magnesium Oxide 400 MG CAPS    melatonin 5 MG TABS    Omega-3 Fatty Acids (FISH OIL) 1200 MG CAPS Take 1,200 mg by mouth daily.   ondansetron (ZOFRAN) 8 MG tablet Take 1 tablet (8 mg total) by mouth every 8 (eight) hours as needed.   pantoprazole (PROTONIX) 40 MG tablet Take by mouth.   prochlorperazine (COMPAZINE) 10 MG tablet Take 1 tablet (10 mg total) by mouth every 6 (six) hours as needed for nausea or vomiting.   vitamin B-12 (CYANOCOBALAMIN) 1000 MCG tablet Take 1,000 mcg by mouth daily.   No facility-administered encounter medications on file as of 05/08/2022.    Allergies (verified) Demerol, Promethazine  hcl, and Ativan [lorazepam]   History: Past Medical History:  Diagnosis Date   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 symptoms (LUTS)    CAD (coronary artery disease)    Cholelithiasis    Chronic allergic rhinitis    Coronary atherosclerosis of native coronary artery    Fatty liver    GERD (gastroesophageal reflux disease)    HX 16yr ago, no longer a problem, no meds   High blood pressure    Hypercholesterolemia    Macrocytosis without anemia    Multiple myeloma not having achieved remission ([facility] 09/26/2021   OSA on CPAP    Paraesophageal hernia    Partial bowel obstruction (HCC)    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 PT denies significant findings   CARDIAC CATHETERIZATION  2008   COLOSTOMY N/A 10/31/2019   Procedure: COLOSTOMY;  Surgeon: CErroll Luna MD;  [facility]: [facility];  Service: General;  Laterality: N/A;   CYSTOSCOPY W/ URETERAL STENT PLACEMENT Bilateral 10/31/2019   Procedure: CYSTOSCOPY URETERAL STENT PLACEMENT BILATERAL;  Surgeon: DFranchot Gallo MD;  [facility];  Service: Urology;  Laterality: Bilateral;   CYSTOSCOPY WITH STENT PLACEMENT Bilateral 03/11/2020   Procedure: CYSTOSCOPY WITH BILATERAL FIREFLY INJECTION;  Surgeon: WIrine Seal MD;  [facility];  Service: Urology;  Laterality: Bilateral;   EYE SURGERY     BILATERAL CATARACT SURGERY WITH LENS IMPLANTS   FLEXIBLE SIGMOIDOSCOPY N/A 10/29/2019   Procedure: FLEXIBLE SIGMOIDOSCOPY;  Surgeon: PJerene Bears MD;  [facility];  Service: Gastroenterology;  Laterality: N/A;   INCISIONAL HERNIA REPAIR N/A 10/13/2020   Procedure: OPEN INCISIONAL HERNIA REPAIR WITH MESH;  Surgeon: RRalene Ok MD;  [facility]  Service: General;  Laterality: N/A;   INSERTION OF MESH N/A 09/08/2016   Procedure: INSERTION OF MESH;  Surgeon: TJackolyn Confer MD;  [facility];  Service: General;  Laterality: N/A;   IR RADIOLOGIST EVAL & MGMT  11/10/2020   IR RADIOLOGIST EVAL & MGMT  11/25/2020   LAPAROSCOPIC SIGMOID COLECTOMY N/A 10/31/2019   Procedure: DIAGNOSTIC LAPAROSCOPY; EXPLORATORY LAPAROTOMY; SIGMOID COLECTOMY;  Surgeon: CErroll Luna MD;  [facility];  Service: General;  Laterality: N/A;   right rotator cuff     SUBMUCOSAL TATTOO INJECTION  10/29/2019   Procedure: SUBMUCOSAL TATTOO  INJECTION;  Surgeon: PJerene Bears MD;  [facility];  Service: Gastroenterology;;   TONSILLECTOMY     UMBILICAL HERNIA REPAIR N/A 09/08/2016   Procedure: OPEN UMBILICAL HERNIA REPAIR WITH MESH;  Surgeon: TJackolyn Confer MD;  [facility];  Service: General;  Laterality: N/A;   VENTRAL HERNIA REPAIR N/A 02/03/2021   Procedure: LAPAROSCOPIC VENTRAL HERNIA REPAIR WITH MESH;  Surgeon: RRalene Ok MD;  [facility]  Service: General;  Laterality: N/A;   XI ROBOTIC ASSISTED COLOSTOMY TAKEDOWN N/A 03/11/2020   Procedure: ROBOTIC ASSISTED COLOSTOMY REVERSAL, RIGID PROCTOSCOPY;  Surgeon: TLeighton Ruff MD;  [facility];  Service: General;  Laterality: N/A;   Family History  Problem Relation Age of Onset   Diabetes Mother    Heart failure Mother        around 85  CAD Father  71   Melanoma Brother 48   Healthy Daughter    Healthy Son    Colon cancer Neg Hx    Esophageal cancer Neg Hx    Rectal cancer Neg Hx    Stomach cancer Neg Hx    Social History   Socioeconomic History   Marital status: Married    Spouse name: Handsome Anglin    Number of children: 2   Years of education: 12   Highest education level: Master's degree (e.g., MA, MS, MEng, MEd, MSW, MBA)  Occupational History   Occupation: Retired   Tobacco Use   Smoking status: Never   Smokeless tobacco: Never  Vaping Use   Vaping Use: Never used  Substance and Sexual Activity   Alcohol use: Yes    Alcohol/week: 17.0 standard drinks of alcohol    Types: 3 Glasses of Dason Mosley, 14 Standard drinks or equivalent per week   Drug use: No   Sexual activity: Not Currently  Other Topics Concern   Not on file  Social History Narrative   Married. 2 children 52 in 47 in 2022- both went to [facility]. 5 grandkids- all girls from oldest 53 looking premed [employer], 63 soph [employer], 47 63 year olds, 4 year olds.       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 [employer]: fishing, [employer] football and basketball, [place], [place] Strain: [place]  (05/08/2022)   Overall Financial Resource Strain ([place])    Difficulty of Paying Living Expenses: Not hard at all  Food Insecurity: No [place] (05/08/2022)   Hunger Vital Sign    Worried About Running Out of Food in the Last Year: Never true    [place] in the Last Year: Never true  Transportation Needs: No Transportation Needs (05/08/2022)   PRAPARE - Hydrologist (Medical): No    Lack of Transportation (Non-Medical): No  Physical Activity: Insufficiently Active (05/08/2022)   Exercise Vital Sign    Days of Exercise per Week: 3 days    Minutes of Exercise per Session: 20 min  Stress: No Stress Concern Present (05/08/2022)   [place]    Feeling of Stress : Only a little  Social Connections: Socially Integrated (05/08/2022)   Social Connection and Isolation Panel [NHANES]    Frequency of Communication with Friends and Family: More than three times a week    Frequency of Social Gatherings with Friends and Family: Once a week    Attends Religious Services: More than 4 times per year    Active Member of [employer] or Organizations: Yes    Attends Music therapist: More than 4 times per year    Marital Status: Married    Tobacco Counseling Counseling given: Not Answered   Clinical Intake:  Pre-visit preparation completed: Yes  Pain : 0-10 Pain Score: 2  Pain Type: Chronic pain Pain Location: Generalized Pain Descriptors / Indicators: Aching, Tingling     Nutritional Status: BMI 25 -29 Overweight Nutritional Risks: None Diabetes: No  How often do you need to have someone help you when you read instructions, pamphlets, or other written materials from  your doctor or pharmacy?: 1 - Never What is the last grade level you completed  in school?: master's  Diabetic?No  Interpreter Needed?: No  Information entered by :: Emelia Loron RN   Activities of Daily Living    05/08/2022    4:21 PM  In your present state of health, do you have any difficulty performing the following activities:  Hearing? 0  Vision? 0  Difficulty concentrating or making decisions? 0  Walking or climbing stairs? 0  Dressing or bathing? 0  Doing errands, shopping? 0  Preparing Food and eating ? N  Using the Toilet? N  In the past six months, have you accidently leaked urine? N  Do you have problems with loss of bowel control? N  Managing your Medications? N  Managing your Finances? N  Housekeeping or managing your Housekeeping? N    Patient Care Team: Marin Olp, MD as PCP - General (Family Medicine) Jerline Pain, MD as PCP - Cardiology (Cardiology) [name], MD as Consulting Physician (General Surgery)  Indicate any recent Medical Services you may have received from other than [facility] providers in the past year (date may be approximate).     Assessment:   This is a routine wellness examination for [place].  Hearing/Vision screen No results found.  Dietary issues and exercise activities discussed: Current Exercise Habits: Home exercise routine, Type of exercise: walking, Time (Minutes): 20, Frequency (Times/Week): 3, Weekly Exercise (Minutes/Week): 60, Intensity: Mild, Exercise limited by: orthopedic condition(s)   Goals Addressed             This Visit's Progress    Patient Stated       I want to increase the amount of times I walk and use my exercise bike to every day to gain strength and be able to take my bass boat out and go fishing.      Depression Screen    05/08/2022    4:49 PM 05/07/2021   11:23 AM 04/11/2021    2:22 PM 01/06/2021    1:22 PM  PHQ 2/9 Scores  PHQ - 2 Score 0 0 0 0  PHQ- 9 Score  0 0 6    Fall  Risk    05/08/2022    4:22 PM 05/07/2021   11:30 AM 04/11/2021    2:21 PM 01/06/2021    1:21 PM  [place] in the past year? _0 0  Number falls in past yr: 0 1 1 0  Injury with Fall? 1 0 1 0  Risk for fall due to : History of fall(s) Other (Comment) Other (Comment)   Risk for fall due to: Comment  currently experiencing vertigo    Follow up Education provided;Falls evaluation completed Falls prevention discussed Falls evaluation completed   Comment  currenlty under MD care for it, also getting physical therapy      FALL RISK PREVENTION PERTAINING TO THE HOME:  Any stairs in or around the home? Yes  If so, are there any without handrails? Yes  Home free of loose throw rugs in walkways, pet beds, electrical cords, etc? No  Adequate lighting in your home to reduce risk of falls? Yes   ASSISTIVE DEVICES UTILIZED TO PREVENT FALLS:  Life alert? No  Use of a cane, walker or w/c? No  Grab bars in the bathroom? Yes  Shower chair or bench in shower? Yes  Elevated toilet seat or a handicapped toilet? Yes   Cognitive Function:        05/08/2022    4:27 PM 05/07/2021   11:35 AM  6CIT Screen  What Year? 0 points 0 points  What month? 0 points 0 points  What time? 0 points 0 points  Count back from 20 0 points 0 points  Months in reverse 0 points 0 points  Repeat phrase 0 points 0 points  Total Score 0 points 0 points    Immunizations Immunization History  Administered Date(s) Administered   Influenza, High Dose Seasonal PF 06/10/2014, 06/28/2015, 04/18/2016, 07/27/2017, 06/04/2018   Influenza, Quadrivalent, Recombinant, Inj, Pf 06/04/2018, 05/13/2019   Influenza-Unspecified 04/29/2012, 07/14/2013, 05/14/2018, 06/01/2021   PFIZER(Purple Top)SARS-COV-2 Vaccination 09/08/2019, 09/29/2019, 05/13/2020, 12/13/2020   Pfizer Covid-19 Vaccine Bivalent Booster 59yr & up 06/01/2021   Pneumococcal Conjugate-13 01/05/2015, 05/13/2019   Pneumococcal Polysaccharide-23 09/20/2011    Td 04/29/2006, 12/08/2019   Zoster Recombinat (Shingrix) 04/06/2020   Zoster, Live 04/29/2008    TDAP status: Up to date  Flu Vaccine status: Due, Education has been provided regarding the importance of this vaccine. Advised may receive this vaccine at local pharmacy or Health Dept. Aware to provide a copy of the vaccination record if obtained from local pharmacy or Health Dept. Verbalized acceptance and understanding.  Pneumococcal vaccine status: Up to date  Covid-19 vaccine status: Information provided on how to obtain vaccines.   Qualifies for Shingles Vaccine? Yes   Zostavax completed No   Shingrix Completed?: No.    Education has been provided regarding the importance of this vaccine. Patient has been advised to call insurance company to determine out of pocket expense if they have not yet received this vaccine. Advised may also receive vaccine at local pharmacy or Health Dept. Verbalized acceptance and understanding.  Screening Tests Health Maintenance  Topic Date Due   Zoster Vaccines- Shingrix (2 of 2) 06/01/2020   COVID-19 Vaccine (6 - Pfizer risk series) 07/27/2021   INFLUENZA VACCINE  05/14/2022 (Originally 03/14/2022)   TETANUS/TDAP  12/07/2029   Pneumonia Vaccine 80 Years old  Completed   HPV VACCINES  Aged Out    Health Maintenance  Health Maintenance Due  Topic Date Due   Zoster Vaccines- Shingrix (2 of 2) 06/01/2020   COVID-19 Vaccine (6 - Pfizer risk series) 07/27/2021    Colorectal cancer screening: No longer required.   Lung Cancer Screening: (Low Dose CT Chest recommended if Age 80-80years, 30 pack-year currently smoking OR have quit w/in 15years.) does not qualify.   Additional Screening:  Hepatitis C Screening: does qualify; Hep C not listed in health maintenance or labs  Vision Screening: Recommended annual ophthalmology exams for early detection of glaucoma and other disorders of the eye. Is the patient up to date with their annual eye exam?  Yes   Who is the provider or what is the name of the office in which the patient attends annual eye exams? Dr. MEllie LunchIf pt is not established with a provider, would they like to be referred to a provider to establish care?  N/A .   Dental Screening: Recommended annual dental exams for proper oral hygiene  Community Resource Referral / Chronic Care Management: CRR required this visit?  No   CCM required this visit?  No      Plan:     I have personally reviewed and noted the following in the patient's chart:   Medical and social history Use of alcohol, tobacco or illicit drugs  Current medications and supplements including opioid prescriptions. Patient is not currently taking opioid prescriptions. Functional ability and status Nutritional status Physical activity Advanced directives List of other physicians Hospitalizations,  surgeries, and ER visits in previous 12 months Vitals Screenings to include cognitive, depression, and falls Referrals and appointments  In addition, I have reviewed and discussed with patient certain preventive protocols, quality metrics, and best practice recommendations. A written personalized care plan for preventive services as well as general preventive health recommendations were provided to patient.     Michiel Cowboy, RN   05/08/2022   Nurse Notes:  Mr. Aguinaldo , Thank you for taking time to come for your Medicare Wellness Visit. I appreciate your ongoing commitment to your health goals. Please review the following plan we discussed and let me know if I can assist you in the future.   These are the goals we discussed:  Goals      Activity and Exercise Increased     Evidence-based guidance:  Review current exercise levels.  Assess patient perspective on exercise or activity level, barriers to increasing activity, motivation and readiness for change.  Recommend or set healthy exercise goal based on individual tolerance.  Encourage small steps toward  making change in amount of exercise or activity.  Urge reduction of sedentary activities or screen time.  Promote group activities within the community or with family or support person.  Consider referral to rehabiliation therapist for assessment and exercise/activity plan.   Notes:      Patient Stated     I want to increase the amount of times I walk and use my exercise bike to every day to gain strength and be able to take my bass boat out and go fishing.        This is a list of the screening recommended for you and due dates:  Health Maintenance  Topic Date Due   Zoster (Shingles) Vaccine (2 of 2) 06/01/2020   COVID-19 Vaccine (6 - Pfizer risk series) 07/27/2021   Flu Shot  05/14/2022*   Tetanus Vaccine  12/07/2029   Pneumonia Vaccine  Completed   HPV Vaccine  Aged Out  *Topic was postponed. The date shown is not the original due date.

## 2022-05-09 ENCOUNTER — Ambulatory Visit (INDEPENDENT_AMBULATORY_CARE_PROVIDER_SITE_OTHER): Payer: Medicare Other | Admitting: Podiatry

## 2022-05-09 DIAGNOSIS — B351 Tinea unguium: Secondary | ICD-10-CM | POA: Diagnosis not present

## 2022-05-09 DIAGNOSIS — M79675 Pain in left toe(s): Secondary | ICD-10-CM

## 2022-05-09 DIAGNOSIS — M79674 Pain in right toe(s): Secondary | ICD-10-CM

## 2022-05-10 ENCOUNTER — Other Ambulatory Visit: Payer: Self-pay | Admitting: *Deleted

## 2022-05-10 DIAGNOSIS — C9000 Multiple myeloma not having achieved remission: Secondary | ICD-10-CM

## 2022-05-10 MED ORDER — LENALIDOMIDE 25 MG PO CAPS: 25.0000 mg | ORAL_CAPSULE | Freq: Every day | ORAL | 0 refills | Status: DC

## 2022-05-11 NOTE — Progress Notes (Signed)
  Subjective:  Patient ID: Wesley [name], male    DOB: 09/23/1940,  MRN: [id number]  Chief Complaint  Patient presents with   Nail Problem    Thick painful toenails, 3 month follow up     81 y.o. male presents with the above complaint. History confirmed with patient. He presents today for painful thickened elongated toenails that he cannot cut.  He also has multiple calluses that are causing pressure and pain.  His wife is a patient of mine and referred him to me.  He is currently undergoing chemotherapy and has noticed burning tingling pain as well as numbness in the feet.  Doing okay no new issues the calluses are thickened and painful and the nails are thick and painful again.  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 Left Foot: dystrophic yellowed discolored nail plates with subungual debris and hyperkeratotic lesion distal tip of third toe Right Foot: dystrophic yellowed discolored nail plates with subungual debris and hyperkeratotic lesion distal tip of great toe  Assessment:   1. Pain due to onychomycosis of toenails of both feet      Plan:  Patient was evaluated and treated and all questions answered.  Discussed the etiology and treatment options for the condition in detail with the patient. Educated patient on the topical and oral treatment options for mycotic nails. Recommended debridement of the nails today. Sharp and mechanical debridement performed of all painful and mycotic nails today. Nails debrided in length and thickness using a nail nipper to level of comfort. Discussed treatment options including appropriate shoe gear. Follow up as needed for painful nails.   Small callused areas were debrided as a courtesy today   Return in about 3 months (around 08/08/2022)  for [place].

## 2022-05-12 ENCOUNTER — Inpatient Hospital Stay: Payer: Medicare Other

## 2022-05-12 ENCOUNTER — Inpatient Hospital Stay: Payer: Medicare Other | Admitting: Physician Assistant

## 2022-05-12 DIAGNOSIS — L24A9 Irritant contact dermatitis due friction or contact with other specified body fluids: Secondary | ICD-10-CM | POA: Diagnosis not present

## 2022-05-12 DIAGNOSIS — Z9889 Other specified postprocedural states: Secondary | ICD-10-CM | POA: Diagnosis not present

## 2022-05-12 DIAGNOSIS — C9000 Multiple myeloma not having achieved remission: Secondary | ICD-10-CM | POA: Diagnosis not present

## 2022-05-12 DIAGNOSIS — Z8719 Personal history of other diseases of the digestive system: Secondary | ICD-10-CM | POA: Diagnosis not present

## 2022-05-12 DIAGNOSIS — T8189XS Other complications of procedures, not elsewhere classified, sequela: Secondary | ICD-10-CM | POA: Diagnosis not present

## 2022-05-18 DIAGNOSIS — Z23 Encounter for immunization: Secondary | ICD-10-CM | POA: Diagnosis not present

## 2022-05-22 ENCOUNTER — Inpatient Hospital Stay (HOSPITAL_BASED_OUTPATIENT_CLINIC_OR_DEPARTMENT_OTHER): Payer: Medicare Other | Admitting: Hematology and Oncology

## 2022-05-22 ENCOUNTER — Inpatient Hospital Stay: Payer: Medicare Other | Attending: Hematology and Oncology

## 2022-05-22 ENCOUNTER — Inpatient Hospital Stay: Payer: Medicare Other

## 2022-05-22 DIAGNOSIS — C9000 Multiple myeloma not having achieved remission: Secondary | ICD-10-CM

## 2022-05-22 DIAGNOSIS — D649 Anemia, unspecified: Secondary | ICD-10-CM | POA: Diagnosis not present

## 2022-05-22 DIAGNOSIS — Z79899 Other long term (current) drug therapy: Secondary | ICD-10-CM | POA: Insufficient documentation

## 2022-05-22 DIAGNOSIS — Z5111 Encounter for antineoplastic chemotherapy: Secondary | ICD-10-CM | POA: Insufficient documentation

## 2022-05-22 LAB — CMP (CANCER CENTER ONLY)
ALT: 29 U/L (ref 0–44)
AST: 23 U/L (ref 15–41)
Albumin: 4.1 g/dL (ref 3.5–5.0)
Alkaline Phosphatase: 92 U/L (ref 38–126)
Anion gap: 6 (ref 5–15)
BUN: 12 mg/dL (ref 8–23)
CO2: 26 mmol/L (ref 22–32)
Calcium: 8.8 mg/dL — ABNORMAL LOW (ref 8.9–10.3)
Chloride: 104 mmol/L (ref 98–111)
Creatinine: 0.9 mg/dL (ref 0.61–1.24)
GFR, Estimated: >60 mL/min (ref >60)
Glucose, Bld: 149 mg/dL — ABNORMAL HIGH (ref 70–99)
Potassium: 3.9 mmol/L (ref 3.5–5.1)
Sodium: 136 mmol/L (ref 135–145)
Total Bilirubin: 0.9 mg/dL (ref 0.3–1.2)
Total Protein: 6.5 g/dL (ref 6.5–8.1)

## 2022-05-22 LAB — CBC WITH DIFFERENTIAL (CANCER CENTER ONLY)
Abs Immature Granulocytes: 0.03 10*3/uL (ref 0.00–0.07)
Basophils Absolute: 0.2 10*3/uL — ABNORMAL HIGH (ref 0.0–0.1)
Basophils Relative: 3 %
Eosinophils Absolute: 0.1 10*3/uL (ref 0.0–0.5)
Eosinophils Relative: 2 %
HCT: 39.8 % (ref 39.0–52.0)
Hemoglobin: 14 g/dL (ref 13.0–17.0)
Immature Granulocytes: 1 %
Lymphocytes Relative: 14 %
Lymphs Abs: 0.6 10*3/uL — ABNORMAL LOW (ref 0.7–4.0)
MCH: 37.1 pg — ABNORMAL HIGH (ref 26.0–34.0)
MCHC: 35.2 g/dL (ref 30.0–36.0)
MCV: 105.6 fL — ABNORMAL HIGH (ref 80.0–100.0)
Monocytes Absolute: 0.4 10*3/uL (ref 0.1–1.0)
Monocytes Relative: 9 %
Neutro Abs: 3.4 10*3/uL (ref 1.7–7.7)
Neutrophils Relative %: 71 %
Platelet Count: 172 10*3/uL (ref 150–400)
RBC: 3.77 MIL/uL — ABNORMAL LOW (ref 4.22–5.81)
RDW: 15.8 % — ABNORMAL HIGH (ref 11.5–15.5)
WBC Count: 4.7 10*3/uL (ref 4.0–10.5)
nRBC: 0 % (ref 0.0–0.2)

## 2022-05-22 LAB — LACTATE DEHYDROGENASE: LDH: 146 U/L (ref 98–192)

## 2022-05-22 MED ORDER — DIPHENHYDRAMINE HCL 25 MG PO CAPS
50.0000 mg | ORAL_CAPSULE | Freq: Once | ORAL | Status: AC
  Administered 2022-05-22: 50 mg via ORAL
  Filled 2022-05-22: qty 2

## 2022-05-22 MED ORDER — DARATUMUMAB-HYALURONIDASE-FIHJ 1800-30000 MG-UT/15ML ~~LOC~~ SOLN
1800.0000 mg | Freq: Once | SUBCUTANEOUS | Status: AC
  Administered 2022-05-22: 1800 mg via SUBCUTANEOUS
  Filled 2022-05-22: qty 15

## 2022-05-22 MED ORDER — ACETAMINOPHEN 325 MG PO TABS
650.0000 mg | ORAL_TABLET | Freq: Once | ORAL | Status: AC
  Administered 2022-05-22: 650 mg via ORAL
  Filled 2022-05-22: qty 2

## 2022-05-22 MED ORDER — SODIUM CHLORIDE 0.9 % IV SOLN
40.0000 mg | Freq: Once | INTRAVENOUS | Status: DC
  Filled 2022-05-22: qty 4

## 2022-05-22 NOTE — Progress Notes (Signed)
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 Further workup showed SPEP M-Spike 4.2 g/dL with IgG-Kappa as monoclonal protein, IgG 5908 mg/dL 09/08/2021: Initial evaluation at [name]. bone marrow biopsy performed, showed 60% abnormal plasma cells in 60% cellular marrow. 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  09/26/2021: transfer care to Dr. Lorenso Courier at [facility].  09/30/2021: Cycle 1 Day 1 of Dara/Dex 10/06/2021: Cycle 1 Day 8 (added Wesley [name]) 10/28/2021: Cycle 2 Day 1 of Dara/Rev/Dex 11/25/2021: Cycle 3 Day 1 of Dara/Rev/Dex 5/12/023: Cycle 4 Day 1 of Dara/Rev/Dex 01/20/2022: Cycle 5 Day 1 of Dara/Rev/Dex 02/17/2022: Cycle 6 Day 1 of Dara/Rev/Dex 03/17/2022: Cycle 7 Day 1 of Dara/Rev/Dex 04/14/2022: Cycle 8 Day 1 of Dara/Rev/Dex 05/22/2022: Cycle 9 Day 1 of Dara/Rev/Dex  Interval History:  Wesley [name] 81 y.o. male with medical history significant for IgG kappa multiple myeloma who presents for a follow up visit. The patient's last visit was on 04/14/2022. In the interim since the last visit he continued treatment with Dara/Wesley [name]/dexamethasone.  On exam today Wesley [name] is accompanied by his wife.  He reports that he has been good in the interim since her last visit.  He did recently get a new COVID shot and is plan to get the flu shot, RSV shot, and shingles shot.  He is not having any side effects as result of his Darzalex shots.   He is not having any itching, redness, or rash.  He reports that he continues to take his steroids on the day he received the shot.  He is not having any nausea or vomiting though he did have a bout of diarrhea which subsequently turned constipation.  His appetite and energy are both excellent.  Overall he feels quite well and is willing and able to proceed with treatment at this time.  He denies fevers, chills, night sweats, shortness of breath, chest pain or cough. He has no other complaints. Full 10 point ROS is listed below.  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)    CAD (coronary artery disease)    Cholelithiasis    Chronic allergic rhinitis    Coronary atherosclerosis of native coronary artery    Fatty liver    GERD (gastroesophageal reflux disease)    HX 84yrs ago, no longer a problem, no meds   High blood pressure    Hypercholesterolemia    Macrocytosis without anemia    Multiple myeloma not having achieved remission (Wesley [name]) 09/26/2021   OSA on CPAP    Paraesophageal hernia    Partial bowel obstruction (HCC)    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 PT denies significant findings   CARDIAC CATHETERIZATION  2008   COLOSTOMY N/A 10/31/2019   Procedure: COLOSTOMY;  Surgeon: Erroll Luna, MD;  Location: [facility];  Service: General;  Laterality: N/A;   CYSTOSCOPY W/ URETERAL STENT PLACEMENT Bilateral 10/31/2019   Procedure: CYSTOSCOPY URETERAL STENT PLACEMENT BILATERAL;  Surgeon: Franchot Gallo, MD;  Location: [facility];  Service: Urology;  Laterality: Bilateral;    CYSTOSCOPY WITH STENT PLACEMENT Bilateral 03/11/2020   Procedure: CYSTOSCOPY WITH BILATERAL FIREFLY INJECTION;  Surgeon: Irine Seal, MD;  Location: [facility];  Service: Urology;  Laterality: Bilateral;   EYE SURGERY     BILATERAL CATARACT SURGERY WITH LENS IMPLANTS   FLEXIBLE SIGMOIDOSCOPY N/A 10/29/2019   Procedure: FLEXIBLE SIGMOIDOSCOPY;  Surgeon: Jerene Bears, MD;  [facility]: [facility];  Service: Gastroenterology;  Laterality: N/A;   INCISIONAL HERNIA REPAIR N/A 10/13/2020   Procedure: OPEN INCISIONAL HERNIA REPAIR WITH MESH;  Surgeon: [name], MD;  Location: [facility];  Service: General;  Laterality: N/A;   INSERTION OF MESH N/A 09/08/2016   Procedure: INSERTION OF MESH;  Surgeon: Jackolyn Confer, MD;  Location: [facility];  Service: General;  Laterality: N/A;   IR RADIOLOGIST EVAL & MGMT  11/10/2020   IR RADIOLOGIST EVAL & MGMT  11/25/2020   LAPAROSCOPIC SIGMOID COLECTOMY N/A 10/31/2019   Procedure: DIAGNOSTIC LAPAROSCOPY; EXPLORATORY LAPAROTOMY; SIGMOID COLECTOMY;  Surgeon: Erroll Luna, MD;  Location: [facility];  Service: General;  Laterality: N/A;   right rotator cuff     SUBMUCOSAL TATTOO INJECTION  10/29/2019   Procedure: SUBMUCOSAL TATTOO INJECTION;  Surgeon: Jerene Bears, MD;  [facility];  Service: Gastroenterology;;   TONSILLECTOMY     UMBILICAL HERNIA REPAIR N/A 09/08/2016   Procedure: OPEN UMBILICAL HERNIA REPAIR WITH MESH;  Surgeon: Jackolyn Confer, MD;  [facility];  Service: General;  Laterality: N/A;   VENTRAL HERNIA REPAIR N/A 02/03/2021   Procedure: LAPAROSCOPIC VENTRAL HERNIA REPAIR WITH MESH;  Surgeon: [name], MD;  Location: [facility];  Service: General;  Laterality: N/A;   XI ROBOTIC ASSISTED COLOSTOMY TAKEDOWN N/A 03/11/2020   Procedure: ROBOTIC ASSISTED COLOSTOMY REVERSAL, RIGID PROCTOSCOPY;  Surgeon: Leighton Ruff, MD;  Location: [facility];  Service: General;  Laterality: N/A;    SOCIAL HISTORY: Social History   Socioeconomic History   Marital  status: Married    Spouse name: Wesley [name]    Number of children: 2   Years of education: 12   Highest education level: Master's degree (e.g., MA, MS, MEng, MEd, MSW, MBA)  Occupational History   Occupation: Retired   Tobacco Use   Smoking status: Never   Smokeless tobacco: Never  Vaping Use   Vaping Use: Never used  Substance and Sexual Activity   Alcohol use: Yes    Alcohol/week: 17.0 standard drinks of alcohol    Types: 3 Glasses of wine, 14 Standard drinks or equivalent per week   Drug use: No   Sexual activity: Not Currently  Other Topics Concern   Not on file  Social History Narrative   Married. 2 children 52 in 34 in 2022- both went to [employer] 5 grandkids- all girls from oldest 5 looking premed [employer], 100 soph [place], 18 3 year olds, 30 year olds.       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 [employer]: fishing,  [employer] football and basketball, [place] Silvius, yardwork   Social Determinants of Health   Financial Resource Strain: Low Risk  (05/08/2022)   Overall Financial Resource Strain (CARDIA)    Difficulty of Paying Living Expenses: Not hard at all  Food Insecurity: No Food Insecurity (05/08/2022)   Hunger Vital Sign    Worried About Running Out of Food in the Last Year: Never true    Ran Out of Food in the Last Year: Never true  Transportation Needs: No Transportation Needs (05/08/2022)   PRAPARE - Hydrologist (Medical): No    Lack of Transportation (Non-Medical): No  Physical Activity: Insufficiently Active (05/08/2022)   Exercise Vital Sign    Days of Exercise per Week: 3 days    Minutes of Exercise per Session: 20 min  Stress: No Stress Concern Present (05/08/2022)   [place]    Feeling of Stress : Only a little  Social Connections: Socially  Integrated (05/08/2022)   Social Connection and Isolation Panel [NHANES]    Frequency of Communication with Friends and Family: More than three times a week    Frequency of Social Gatherings with Friends and Family: Once a week    Attends Religious Services: More than 4 times per year    Active Member of [employer] or Organizations: Yes    Attends Music therapist: More than 4 times per year    Marital Status: Married  Human resources officer Violence: Not At Risk (05/08/2022)   Humiliation, Afraid, Rape, and Kick questionnaire    Fear of Current or Ex-Partner: No    Emotionally Abused: No    Physically Abused: No    Sexually Abused: No    FAMILY HISTORY: Family History  Problem Relation Age of Onset   Diabetes Mother    Heart failure Mother        around 66   CAD Father        20   Melanoma Brother 94   Healthy Daughter    Healthy Son    Colon cancer Neg Hx    Esophageal cancer Neg Hx    Rectal cancer Neg Hx    Stomach cancer Neg Hx     ALLERGIES:  is allergic to demerol, promethazine hcl, and ativan [lorazepam].  MEDICATIONS:  Current Outpatient Medications  Medication Sig Dispense Refill   acetaminophen (TYLENOL) 500 MG tablet Take 500 mg by mouth every 6 (six) hours as needed.     acyclovir (ZOVIRAX) 400 MG tablet Take 400 mg by mouth in the morning and at bedtime.     allopurinol (ZYLOPRIM) 300 MG tablet TAKE ONE TABLET BY MOUTH DAILY 60 tablet 1   aspirin EC 81 MG tablet Take 81 mg by mouth daily. Swallow whole.     atorvastatin (LIPITOR) 80 MG tablet Take 1 tablet (80 mg total) by mouth daily. Please call to schedule an overdue appointment with Dr. Marlou Porch for refills, [phone], thank you. 1st attempt. 30 tablet 0   Cholecalciferol (VITAMIN D3) 25 MCG (1000 UT) CAPS Take 1,000 Units by mouth daily.     dexamethasone (DECADRON) 4 MG tablet Take 5 tablets (20 mg total) by mouth once a week. 20 tablet 3   dexamethasone (DECADRON) 4 MG tablet Take 67m daily in AM for  4 days following chemotherapy infusion days. 60 tablet 1   fexofenadine (ALLEGRA) 180 MG tablet      finasteride (PROSCAR) 5 MG tablet Take 5  mg by mouth daily.     fluticasone (FLONASE) 50 MCG/ACT nasal spray Place into the nose.     Ibuprofen 200 MG CAPS      lenalidomide (W[name]) 25 MG capsule Take 1 capsule (25 mg total) by mouth daily. Take for 21 days, none for 7 days. Repeat every 28 days. [employer] Auth # [id number] obtained 05/10/22 21 capsule 0   lisinopril (ZESTRIL) 10 MG tablet Take 1 tablet (10 mg total) by mouth daily. 90 tablet 3   Magnesium Oxide 400 MG CAPS      melatonin 5 MG TABS      Omega-3 Fatty Acids (FISH OIL) 1200 MG CAPS Take 1,200 mg by mouth daily.     ondansetron (ZOFRAN) 8 MG tablet Take 1 tablet (8 mg total) by mouth every 8 (eight) hours as needed. 30 tablet 0   pantoprazole (PROTONIX) 40 MG tablet Take by mouth.     prochlorperazine (COMPAZINE) 10 MG tablet Take 1 tablet (10 mg total) by mouth every 6 (six) hours as needed for nausea or vomiting. 30 tablet 0   vitamin B-12 (CYANOCOBALAMIN) 1000 MCG tablet Take 1,000 mcg by mouth daily.     No current facility-administered medications for this visit.    REVIEW OF SYSTEMS:   Constitutional: ( - ) fevers, ( - )  chills , ( - ) night sweats Eyes: ( - ) blurriness of vision, ( - ) double vision, ( - ) watery eyes Ears, nose, mouth, throat, and face: ( - ) mucositis, ( - ) sore throat Respiratory: ( - ) cough, ( - ) dyspnea, ( - ) wheezes Cardiovascular: ( - ) palpitation, ( - ) chest discomfort, ( - ) lower extremity swelling Gastrointestinal:  ( - ) nausea, ( - ) heartburn, ( - ) change in bowel habits Skin: ( - ) abnormal skin rashes Lymphatics: ( - ) new lymphadenopathy, ( - ) easy bruising Neurological: ( + ) numbness, ( - ) tingling, ( - ) new weaknesses Behavioral/Psych: ( - ) mood change, ( - ) new changes  All other systems were reviewed with the patient and are negative.  PHYSICAL EXAMINATION: ECOG  PERFORMANCE STATUS: 1 - Symptomatic but completely ambulatory  Vitals:   05/22/22 1118  BP: 132/73  Pulse: 72  Resp: 15  Temp: 97.7 F (36.5 C)  SpO2: 98%    Filed Weights   05/22/22 1118  Weight: 195 lb 5 oz (88.6 kg)     GENERAL: Well-appearing elderly Caucasian male, alert, no distress and comfortable SKIN: skin color, texture, turgor are normal, no rashes or significant lesions EYES: conjunctiva are pink and non-injected, sclera clear LUNGS: clear to auscultation and percussion with normal breathing effort HEART: regular rate & rhythm and no murmurs and no lower extremity edema ABDOMEN: covered wound sites, no erythema or discharged noted.  Musculoskeletal: no cyanosis of digits and no clubbing  PSYCH: alert & oriented x 3, fluent speech NEURO: no focal motor/sensory deficits  LABORATORY DATA:  I have reviewed the data as listed    Latest Ref Rng & Units 05/22/2022   10:56 AM 04/14/2022    8:57 AM 03/17/2022    1:05 PM  CBC  WBC 4.0 - 10.5 K/uL 4.7  3.3  5.4   Hemoglobin 13.0 - 17.0 g/dL 14.0  13.9  13.8   Hematocrit 39.0 - 52.0 % 39.8  39.4  40.1   Platelets 150 - 400 K/uL 172  169  131  Latest Ref Rng & Units 05/22/2022   10:56 AM 04/14/2022    8:57 AM 03/17/2022    1:05 PM  CMP  Glucose 70 - 99 mg/dL 149  112  138   BUN 8 - 23 mg/dL _0 Creatinine 0.61 - 1.24 mg/dL 0.90  1.00  0.96   Sodium 135 - 145 mmol/L 136  143  138   Potassium 3.5 - 5.1 mmol/L 3.9  3.9  4.6   Chloride 98 - 111 mmol/L 104  108  105   CO2 22 - 32 mmol/L _1 Calcium 8.9 - 10.3 mg/dL 8.8  9.1  8.6   Total Protein 6.5 - 8.1 g/dL 6.5  6.3  6.6   Total Bilirubin 0.3 - 1.2 mg/dL 0.9  0.7  0.7   Alkaline Phos 38 - 126 U/L 92  91  96   AST 15 - 41 U/L _2 ALT 0 - 44 U/L _3 Lab Results  Component Value Date   MPROTEIN 0.1 (H) 04/14/2022   MPROTEIN 0.2 (H) 03/17/2022   MPROTEIN 0.2 (H) 02/17/2022   Lab Results  Component Value Date    KPAFRELGTCHN 11.1 04/14/2022   KPAFRELGTCHN 10.2 03/17/2022   KPAFRELGTCHN 10.8 02/17/2022   LAMBDASER 8.1 04/14/2022   LAMBDASER 6.6 03/17/2022   LAMBDASER 5.9 02/17/2022   KAPLAMBRATIO 1.37 04/14/2022   KAPLAMBRATIO 1.55 03/17/2022   KAPLAMBRATIO 1.83 (H) 02/17/2022    RADIOGRAPHIC STUDIES: No results found.  ASSESSMENT & PLAN  Wesley [name] 81 y.o. male with medical history significant for IgG kappa multiple myeloma who presents for a follow up visit.   The patient meets diagnostic criteria by having greater than 60% plasma cells in his bone marrow.  At this time we are in agreement with Wesley [name], Wesley [name], and dexamethasone as the treatment of choice.   Previously we discussed the concept of comanaged care.  Comanaged care is when the patient has a local primary provider who administers local support and therapy while expert advice and treatment recommendations are rendered by a cancer specialist at a large academic [name].  In this arrangement we provide local support, labs, treatment, and emergency visits, however the major decisions regarding the course of treatment are decided by a physician at an academic medical [name].  The patient voiced his understanding of comanaged care and was agreeable to proceeding forward with this care model.   The regimen of choice for this patient will be Dara, Wesley [name], and dexamethasone.  We will start with weekly treatments for the first 2 cycles.  After that for an additional 4 cycles he will be getting it every other week.  After cycle 6 the patient will then continue with monthly treatment.    # IgG Kappa Multiple Myeloma --Patient meets diagnostic criteria for multiple myeloma based on the presence of 60% plasma cells in the bone marrow --Pretreatment phenotype RBC as well as type and screen ordered  --Cycle 1 Day 1of Dara/Rev/Dex started on 09/30/2021. He started without Wesley [name] which was added on 10/06/2021  (Cycle 1 Day 8)  -- Labs to include weekly CBC, CMP, LDH.  Additionally monthly SPEP and serum free light chains. Plan:  --Today is Cycle 9 Day 1 of Dara/Rev/Dex --Labs today were reviewed and adequate for treatment.  Labs show WBC 4.7, Hgb 14.0, MCV 105.6, Plt 172 --  Return to clinic in 4 weeks prior to Cycle 10 Day 1 of Dara/Rev/Dex.   #Lethargy 2/2 Zometa:  --Discussed alternative which would be Xgeva q 28 days.  -- We will plan to start Xgeva at his next visit.   # Boils on Abdomen -- Patient underwent I&D and subsequent antibiotic treatment with no resolution.  2 new lesions have developed at the site of his prior ostomy --Patient was seen by surgery who ordered CT scan on 01/31/2022 that showed no fluid collection in the anterior abdominal wall. Instructed patient on how to pack wounds.  --No systemic symptoms, okay to continue with systemic chemotherapy. --continue to follow with surgery.   #Macrocytosis #Leukopenia-stable # Thrombocytopenia-resolved --WBC 4.7, Hgb 14.0, MCV 105.6, Plt 172 --Levels fluctuating with treatment.  #Neuropathy:  --Stable and does not interfere with grip or balance --Continue to monitor.    #Supportive Care -- chemotherapy education complete  -- port placement not required.  --ASA 81 mg p.o. daily for thromboprophylaxis on Wesley [name]. -- zofran 67m q8H PRN and compazine 143mPO q6H for nausea -- acyclovir 40025mO BID for VCZ prophylaxis -- allopurinol 300m47m daily for TLS prophylaxis -- zometa clearance obtained, started on 12/09/2021. Next dose in July 2023.  -- no pain medication required at this time.   No orders of the defined types were placed in this encounter.  All questions were answered. The patient knows to call the clinic with any problems, questions or concerns.  I have spent a total of 30 minutes minutes of face-to-face and non-face-to-face time, preparing to see the patient, performing a medically appropriate examination,  counseling and educating the patient, ordering medications/tests, documenting clinical information in the electronic health record,  and care coordination.   JohnLedell Peoples Department of Hematology/Oncology [facility] [place]: [phone]: [phone]: [email or web address]  05/22/2022 2:55 PM

## 2022-05-23 LAB — KAPPA/LAMBDA LIGHT CHAINS
Kappa free light chain: 9.7 mg/L (ref 3.3–19.4)
Kappa, lambda light chain ratio: 1.73 — ABNORMAL HIGH (ref 0.26–1.65)
Lambda free light chains: 5.6 mg/L — ABNORMAL LOW (ref 5.7–26.3)

## 2022-05-24 ENCOUNTER — Other Ambulatory Visit: Payer: Self-pay | Admitting: *Deleted

## 2022-05-24 MED ORDER — ACYCLOVIR 400 MG PO TABS: 400.0000 mg | ORAL_TABLET | Freq: Two times a day (BID) | ORAL | 2 refills | Status: DC

## 2022-05-24 MED ORDER — ALLOPURINOL 300 MG PO TABS: 300.0000 mg | ORAL_TABLET | Freq: Every day | ORAL | 2 refills | Status: DC

## 2022-05-26 LAB — MULTIPLE MYELOMA PANEL, SERUM
Albumin SerPl Elph-Mcnc: 3.5 g/dL (ref 2.9–4.4)
Albumin/Glob SerPl: 1.6 (ref 0.7–1.7)
Alpha 1: 0.3 g/dL (ref 0.0–0.4)
Alpha2 Glob SerPl Elph-Mcnc: 0.7 g/dL (ref 0.4–1.0)
B-Globulin SerPl Elph-Mcnc: 0.9 g/dL (ref 0.7–1.3)
Gamma Glob SerPl Elph-Mcnc: 0.5 g/dL (ref 0.4–1.8)
Globulin, Total: 2.3 g/dL (ref 2.2–3.9)
IgA: 90 mg/dL (ref 61–437)
IgG (Immunoglobin G), Serum: 417 mg/dL — ABNORMAL LOW (ref 603–1613)
IgM (Immunoglobulin M), Srm: 28 mg/dL (ref 15–143)
M Protein SerPl Elph-Mcnc: 0.2 g/dL — ABNORMAL HIGH
Total Protein ELP: 5.8 g/dL — ABNORMAL LOW (ref 6.0–8.5)

## 2022-05-29 ENCOUNTER — Telehealth: Payer: Self-pay | Admitting: *Deleted

## 2022-05-29 NOTE — Telephone Encounter (Signed)
Received vm message from pt. He is concerned that his M protein went up by o.1 this last time. His kappa/lambda ratio also increased somewhat this week.  Please advise.

## 2022-05-30 LAB — IFE, DARA-SPECIFIC, SERUM
IgA: 94 mg/dL (ref 61–437)
IgG (Immunoglobin G), Serum: 428 mg/dL — ABNORMAL LOW (ref 603–1613)
IgM (Immunoglobulin M), Srm: 28 mg/dL (ref 15–143)

## 2022-05-31 DIAGNOSIS — Z23 Encounter for immunization: Secondary | ICD-10-CM | POA: Diagnosis not present

## 2022-06-02 ENCOUNTER — Other Ambulatory Visit: Payer: Self-pay | Admitting: *Deleted

## 2022-06-02 DIAGNOSIS — C9000 Multiple myeloma not having achieved remission: Secondary | ICD-10-CM

## 2022-06-02 MED ORDER — LENALIDOMIDE 25 MG PO CAPS: 25.0000 mg | ORAL_CAPSULE | Freq: Every day | ORAL | 0 refills | Status: DC

## 2022-06-08 ENCOUNTER — Telehealth: Payer: Self-pay | Admitting: *Deleted

## 2022-06-08 NOTE — Telephone Encounter (Signed)
Received call from pt requesting that his Revlimid get sent to his location @ [facility]. He will be there for 1.5 weeks Gave pt phone to [place] so he can call and make arrangements for delivery. Pt is agreeable to this plan.

## 2022-06-09 ENCOUNTER — Ambulatory Visit: Payer: Medicare Other | Admitting: Hematology and Oncology

## 2022-06-09 ENCOUNTER — Ambulatory Visit: Payer: Medicare Other

## 2022-06-09 ENCOUNTER — Other Ambulatory Visit: Payer: Medicare Other

## 2022-06-12 ENCOUNTER — Other Ambulatory Visit: Payer: Medicare Other

## 2022-06-12 ENCOUNTER — Ambulatory Visit: Payer: Medicare Other | Admitting: Hematology and Oncology

## 2022-06-12 ENCOUNTER — Ambulatory Visit: Payer: Medicare Other

## 2022-06-19 ENCOUNTER — Other Ambulatory Visit: Payer: Self-pay | Admitting: Hematology and Oncology

## 2022-06-19 ENCOUNTER — Inpatient Hospital Stay: Payer: Medicare Other | Attending: Hematology and Oncology

## 2022-06-19 ENCOUNTER — Inpatient Hospital Stay (HOSPITAL_BASED_OUTPATIENT_CLINIC_OR_DEPARTMENT_OTHER): Payer: Medicare Other | Admitting: Hematology and Oncology

## 2022-06-19 ENCOUNTER — Inpatient Hospital Stay: Payer: Medicare Other

## 2022-06-19 VITALS — BP 130/77 | HR 77 | Temp 97.7°F | Resp 18 | Ht 68.0 in | Wt 197.3 lb

## 2022-06-19 DIAGNOSIS — D7589 Other specified diseases of blood and blood-forming organs: Secondary | ICD-10-CM | POA: Insufficient documentation

## 2022-06-19 DIAGNOSIS — Z5111 Encounter for antineoplastic chemotherapy: Secondary | ICD-10-CM | POA: Diagnosis not present

## 2022-06-19 DIAGNOSIS — C9000 Multiple myeloma not having achieved remission: Secondary | ICD-10-CM | POA: Insufficient documentation

## 2022-06-19 DIAGNOSIS — D539 Nutritional anemia, unspecified: Secondary | ICD-10-CM

## 2022-06-19 DIAGNOSIS — D72819 Decreased white blood cell count, unspecified: Secondary | ICD-10-CM | POA: Insufficient documentation

## 2022-06-19 DIAGNOSIS — Z79899 Other long term (current) drug therapy: Secondary | ICD-10-CM | POA: Insufficient documentation

## 2022-06-19 DIAGNOSIS — G629 Polyneuropathy, unspecified: Secondary | ICD-10-CM | POA: Insufficient documentation

## 2022-06-19 LAB — CBC WITH DIFFERENTIAL (CANCER CENTER ONLY)
Abs Immature Granulocytes: 0.04 10*3/uL (ref 0.00–0.07)
Basophils Absolute: 0.1 10*3/uL (ref 0.0–0.1)
Basophils Relative: 3 %
Eosinophils Absolute: 0.1 10*3/uL (ref 0.0–0.5)
Eosinophils Relative: 3 %
HCT: 37.4 % — ABNORMAL LOW (ref 39.0–52.0)
Hemoglobin: 13.5 g/dL (ref 13.0–17.0)
Immature Granulocytes: 1 %
Lymphocytes Relative: 19 %
Lymphs Abs: 0.7 10*3/uL (ref 0.7–4.0)
MCH: 38.4 pg — ABNORMAL HIGH (ref 26.0–34.0)
MCHC: 36.1 g/dL — ABNORMAL HIGH (ref 30.0–36.0)
MCV: 106.3 fL — ABNORMAL HIGH (ref 80.0–100.0)
Monocytes Absolute: 0.5 10*3/uL (ref 0.1–1.0)
Monocytes Relative: 12 %
Neutro Abs: 2.3 10*3/uL (ref 1.7–7.7)
Neutrophils Relative %: 62 %
Platelet Count: 171 10*3/uL (ref 150–400)
RBC: 3.52 MIL/uL — ABNORMAL LOW (ref 4.22–5.81)
RDW: 15.3 % (ref 11.5–15.5)
WBC Count: 3.8 10*3/uL — ABNORMAL LOW (ref 4.0–10.5)
nRBC: 0 % (ref 0.0–0.2)

## 2022-06-19 LAB — LACTATE DEHYDROGENASE: LDH: 144 U/L (ref 98–192)

## 2022-06-19 LAB — CMP (CANCER CENTER ONLY)
ALT: 29 U/L (ref 0–44)
AST: 20 U/L (ref 15–41)
Albumin: 3.8 g/dL (ref 3.5–5.0)
Alkaline Phosphatase: 75 U/L (ref 38–126)
Anion gap: 7 (ref 5–15)
BUN: 17 mg/dL (ref 8–23)
CO2: 25 mmol/L (ref 22–32)
Calcium: 8.7 mg/dL — ABNORMAL LOW (ref 8.9–10.3)
Chloride: 106 mmol/L (ref 98–111)
Creatinine: 0.92 mg/dL (ref 0.61–1.24)
GFR, Estimated: >60 mL/min (ref >60)
Glucose, Bld: 134 mg/dL — ABNORMAL HIGH (ref 70–99)
Potassium: 3.9 mmol/L (ref 3.5–5.1)
Sodium: 138 mmol/L (ref 135–145)
Total Bilirubin: 0.9 mg/dL (ref 0.3–1.2)
Total Protein: 5.9 g/dL — ABNORMAL LOW (ref 6.5–8.1)

## 2022-06-19 MED ORDER — DARATUMUMAB-HYALURONIDASE-FIHJ 1800-30000 MG-UT/15ML ~~LOC~~ SOLN
1800.0000 mg | Freq: Once | SUBCUTANEOUS | Status: AC
  Administered 2022-06-19: 1800 mg via SUBCUTANEOUS
  Filled 2022-06-19: qty 15

## 2022-06-19 MED ORDER — ACETAMINOPHEN 325 MG PO TABS
650.0000 mg | ORAL_TABLET | Freq: Once | ORAL | Status: AC
  Administered 2022-06-19: 650 mg via ORAL
  Filled 2022-06-19: qty 2

## 2022-06-19 MED ORDER — DIPHENHYDRAMINE HCL 25 MG PO CAPS
50.0000 mg | ORAL_CAPSULE | Freq: Once | ORAL | Status: AC
  Administered 2022-06-19: 50 mg via ORAL
  Filled 2022-06-19: qty 2

## 2022-06-19 NOTE — Patient Instructions (Signed)
[facility] ONCOL[facility]  Discharge Instructions: Thank you for choosing [facility] to provide your oncology and hematology care.   If you have a lab appointment with the [facility], please go directly to the [facility] and check in at the registration area.   Wear comfortable clothing and clothing appropriate for easy access to any Portacath or PICC line.   We strive to give you quality time with your provider. You may need to reschedule your appointment if you arrive late (15 or more minutes).  Arriving late affects you and other patients whose appointments are after yours.  Also, if you miss three or more appointments without notifying the office, you may be dismissed from the clinic at the provider's discretion.      For prescription refill requests, have your pharmacy contact our office and allow 72 hours for refills to be completed.    Today you received the following chemotherapy and/or immunotherapy agents: Darzalex Faspro      To help prevent nausea and vomiting after your treatment, we encourage you to take your nausea medication as directed.  BELOW ARE SYMPTOMS THAT SHOULD BE REPORTED IMMEDIATELY: *FEVER GREATER THAN 100.4 F (38 C) OR HIGHER *CHILLS OR SWEATING *NAUSEA AND VOMITING THAT IS NOT CONTROLLED WITH YOUR NAUSEA MEDICATION *UNUSUAL SHORTNESS OF BREATH *UNUSUAL BRUISING OR BLEEDING *URINARY PROBLEMS (pain or burning when urinating, or frequent urination) *BOWEL PROBLEMS (unusual diarrhea, constipation, pain near the anus) TENDERNESS IN MOUTH AND THROAT WITH OR WITHOUT PRESENCE OF ULCERS (sore throat, sores in mouth, or a toothache) UNUSUAL RASH, SWELLING OR PAIN  UNUSUAL VAGINAL DISCHARGE OR ITCHING   Items with * indicate a potential emergency and should be followed up as soon as possible or go to the Emergency Department if any problems should occur.  Please show the CHEMOTHERAPY ALERT CARD or IMMUNOTHERAPY ALERT CARD at  check-in to the Emergency Department and triage nurse.  Should you have questions after your visit or need to cancel or reschedule your appointment, please contact [facility] [place]   Dept: [phone]  and follow the prompts.  Office hours are 8:00 a.m. to 4:30 p.m. Monday - Friday. Please note that voicemails left after 4:00 p.m. may not be returned until the following business day.  We are closed weekends and major holidays. You have access to a nurse at all times for urgent questions. Please call the main number to the clinic Dept: [phone] and follow the prompts.   For any non-urgent questions, you may also contact your provider using MyChart. We now offer e-Visits for anyone 85 and older to request care online for non-urgent symptoms. For details visit [email or web address].   Also download the MyChart app! Go to the app store, search "MyChart", open the app, select [facility], and log in with your MyChart username and password.  Masks are optional in the cancer centers. If you would like for your care team to wear a mask while they are taking care of you, please let them know. You may have one support person who is at least 80 years old accompany you for your appointments.

## 2022-06-19 NOTE — Progress Notes (Signed)
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 Further workup showed SPEP M-Spike 4.2 g/dL with IgG-Kappa as monoclonal protein, IgG 5908 mg/dL 09/08/2021: Initial evaluation at [facility]. bone marrow biopsy performed, showed 60% abnormal plasma cells in 60% cellular marrow. 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  09/26/2021: transfer care to Dr. Lorenso Courier at [facility].  09/30/2021: Cycle 1 Day 1 of Dara/Dex 10/06/2021: Cycle 1 Day 8 (added revlimid) 10/28/2021: Cycle 2 Day 1 of Dara/Rev/Dex 11/25/2021: Cycle 3 Day 1 of Dara/Rev/Dex 5/12/023: Cycle 4 Day 1 of Dara/Rev/Dex 01/20/2022: Cycle 5 Day 1 of Dara/Rev/Dex 02/17/2022: Cycle 6 Day 1 of Dara/Rev/Dex 03/17/2022: Cycle 7 Day 1 of Dara/Rev/Dex 04/14/2022: Cycle 8 Day 1 of Dara/Rev/Dex 05/22/2022: Cycle 9 Day 1 of Dara/Rev/Dex 06/19/2022: Cycle 10 Day 1 of Dara/Rev/Dex  Interval History:  Wesley [name] 81 y.o. male with medical history significant for IgG kappa multiple myeloma who presents for a follow up visit. The patient's last visit was on 05/22/2022. In the interim since the last visit he continued treatment with Dara/Revlimid/dexamethasone.  On exam today Wesley [name] is accompanied by his wife.  He reports he has been well overall in the interim since her last visit.  He notes he is a little bit disappointed that his M protein has not reached 0 yet.  He notes that he continues to take  the dexamethasone with each injection though he does get a steroid crash a few days later.  It does not cause him any insomnia.  He and his wife recently did take a drive to the beach and enjoyed a nice relaxing vacation there.  He notes he is not having any noticeable side effects as result of his Darzalex shot.  His Revlimid pills are arriving on time and not causing any difficulty.  He is not having any nausea, vomiting, or diarrhea.  He denies fevers, chills, night sweats, shortness of breath, chest pain or cough. He has no other complaints. Full 10 point ROS is listed below.  Overall he is willing and able to proceed with continued treatment at this time.  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)    CAD (coronary artery disease)    Cholelithiasis    Chronic allergic rhinitis    Coronary atherosclerosis of native coronary artery    Fatty liver    GERD (gastroesophageal reflux disease)    HX 57yr ago, no longer a problem, no meds   High blood pressure    Hypercholesterolemia    Macrocytosis without anemia    Multiple myeloma not having achieved remission ([facility] 09/26/2021   OSA on CPAP    Paraesophageal hernia    [place]    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 PT denies significant findings   CARDIAC CATHETERIZATION  2008   COLOSTOMY N/A 10/31/2019   Procedure: COLOSTOMY;  Surgeon: Erroll Luna, MD;  Location: [facility];  Service: General;  Laterality: N/A;   CYSTOSCOPY W/ URETERAL STENT PLACEMENT Bilateral 10/31/2019   Procedure:  CYSTOSCOPY URETERAL STENT PLACEMENT BILATERAL;  Surgeon: Franchot Gallo, MD;  Location[facility];  Service: Urology;  Laterality: Bilateral;   CYSTOSCOPY WITH STENT PLACEMENT Bilateral 03/11/2020   Procedure: CYSTOSCOPY WITH BILATERAL FIREFLY INJECTION;  Surgeon: Irine Seal, MD;  Location: [facility];  Service: Urology;  Laterality: Bilateral;   EYE SURGERY     BILATERAL CATARACT SURGERY WITH LENS IMPLANTS   FLEXIBLE SIGMOIDOSCOPY N/A 10/29/2019   Procedure: FLEXIBLE SIGMOIDOSCOPY;  Surgeon: Jerene Bears, MD;  [facility];  Service: Gastroenterology;  Laterality: N/A;   INCISIONAL HERNIA REPAIR N/A 10/13/2020   Procedure: OPEN INCISIONAL HERNIA REPAIR WITH MESH;  Surgeon: [name], MD;  Location: [facility];  Service: General;  Laterality: N/A;   INSERTION OF MESH N/A 09/08/2016   Procedure: INSERTION OF MESH;  Surgeon: Jackolyn Confer, MD;  [facility];  Service: General;  Laterality: N/A;   IR RADIOLOGIST EVAL & MGMT  11/10/2020   IR RADIOLOGIST EVAL & MGMT  11/25/2020   LAPAROSCOPIC SIGMOID COLECTOMY N/A 10/31/2019   Procedure: DIAGNOSTIC LAPAROSCOPY; EXPLORATORY LAPAROTOMY; SIGMOID COLECTOMY;  Surgeon: Erroll Luna, MD;  [facility];  Service: General;  Laterality: N/A;   right rotator cuff     SUBMUCOSAL TATTOO INJECTION  10/29/2019   Procedure: SUBMUCOSAL TATTOO INJECTION;  Surgeon: Jerene Bears, MD;  [facility];  Service: Gastroenterology;;   TONSILLECTOMY     UMBILICAL HERNIA REPAIR N/A 09/08/2016   Procedure: OPEN UMBILICAL HERNIA REPAIR WITH MESH;  Surgeon: Jackolyn Confer, MD;  [facility];  Service: General;  Laterality: N/A;   VENTRAL HERNIA REPAIR N/A 02/03/2021   Procedure: LAPAROSCOPIC VENTRAL HERNIA REPAIR WITH MESH;  Surgeon: [name], MD;  Location: [facility];  Service: General;  Laterality: N/A;   XI ROBOTIC ASSISTED COLOSTOMY TAKEDOWN N/A 03/11/2020   Procedure: ROBOTIC ASSISTED COLOSTOMY REVERSAL, RIGID PROCTOSCOPY;  Surgeon: Leighton Ruff, MD;  Location[facility];  Service: General;  Laterality: N/A;    SOCIAL HISTORY: Social History   Socioeconomic History   Marital status: Married    Spouse name: Wesley [name]    Number of children: 2   Years of education: 12   Highest education level: Master's degree (e.g., MA, MS, MEng, MEd, MSW, MBA)  Occupational History   Occupation: Retired   Tobacco Use   Smoking status: Never   Smokeless tobacco: Never  Vaping Use   Vaping Use: Never used  Substance and Sexual Activity   Alcohol use: Yes    Alcohol/week: 17.0 standard drinks of alcohol    Types: 3 Glasses of wine, 14 Standard drinks or equivalent per week   Drug use: No   Sexual activity: Not Currently  Other Topics Concern   Not on file  Social History Narrative   Married. 2 children 52 in 65 in 2022- both went to [employer]. 5 grandkids- all girls from oldest 67 looking premed [employer], 35 soph [place], 78 63 year olds, 59 year olds.       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 [employer]: fishing, [place] football and basketball, [place], [place] Strain: [place]  (05/08/2022)   Overall Financial Resource Strain (CARDIA)    Difficulty of Paying Living Expenses: Not hard at all  Food Insecurity: No Food Insecurity (05/08/2022)   Hunger Vital Sign    Worried About Running Out of Food in the Last Year: Never true    [place] in the Last Year: Never true  Transportation Needs: No Transportation Needs (05/08/2022)   PRAPARE - Hydrologist (Medical): No    Lack of Transportation (Non-Medical): No  Physical Activity: Insufficiently Active (05/08/2022)   Exercise Vital Sign    Days of Exercise per Week: 3 days    Minutes of Exercise per Session: 20 min  Stress: No Stress Concern Present (05/08/2022)   [place]    Feeling of Stress : Only a little  Social Connections: Socially Integrated (05/08/2022)   Social Connection and Isolation Panel [NHANES]    Frequency of Communication with Friends and Family: More than three times a week    Frequency of Social Gatherings with Friends and Family: Once a week    Attends Religious Services: More than 4 times per year    Active Member of [employer] or Organizations: Yes    Attends Music therapist: More than 4 times per year    Marital Status: Married  Human resources officer Violence: Not At Risk (05/08/2022)   Humiliation, Afraid, Rape, and Kick questionnaire    Fear of Current or Ex-Partner: No    Emotionally Abused: No    Physically Abused: No    Sexually Abused: No    FAMILY HISTORY: Family History  Problem Relation Age of Onset   Diabetes Mother    Heart failure Mother        around 56   CAD Father        70   Melanoma Brother 37   Healthy Daughter    Healthy Son    Colon cancer Neg Hx    Esophageal cancer Neg Hx    Rectal cancer Neg Hx    Stomach cancer Neg Hx     ALLERGIES:  is allergic to demerol, promethazine hcl, and ativan [lorazepam].  MEDICATIONS:  Current Outpatient Medications  Medication Sig Dispense Refill   acetaminophen (TYLENOL) 500 MG tablet Take 500 mg by mouth every 6 (six) hours as needed.     acyclovir (ZOVIRAX) 400 MG tablet Take 1 tablet (400 mg total) by mouth in the morning and at bedtime. 60 tablet 2   allopurinol (ZYLOPRIM) 300 MG tablet Take 1 tablet (300 mg total) by mouth daily. 60 tablet 2   aspirin EC 81 MG tablet Take 81 mg by mouth daily. Swallow whole.     atorvastatin (LIPITOR) 80 MG tablet Take 1 tablet (80 mg total) by mouth daily. Please call to schedule an overdue appointment with Dr. Marlou Porch for refills, [phone], thank you. 1st attempt. 30 tablet 0   Cholecalciferol (VITAMIN D3) 25 MCG (1000 UT) CAPS Take 1,000 Units by  mouth daily.     dexamethasone (DECADRON) 4 MG tablet Take 5 tablets (20 mg total) by mouth once a week. 20 tablet 3   fexofenadine (ALLEGRA) 180 MG tablet  finasteride (PROSCAR) 5 MG tablet Take 5 mg by mouth daily.     fluticasone (FLONASE) 50 MCG/ACT nasal spray Place into the nose.     Ibuprofen 200 MG CAPS      lenalidomide (REVLIMID) 25 MG capsule Take 1 capsule (25 mg total) by mouth daily. Take for 21 days, none for 7 days. Repeat every 28 days. [employer] Auth # [id number] obtained 06/02/22 21 capsule 0   lisinopril (ZESTRIL) 10 MG tablet Take 1 tablet (10 mg total) by mouth daily. 90 tablet 3   loratadine (CLARITIN) 10 MG tablet Take 10 mg by mouth daily.     Magnesium Oxide 400 MG CAPS      melatonin 5 MG TABS      Omega-3 Fatty Acids (FISH OIL) 1200 MG CAPS Take 1,200 mg by mouth daily.     ondansetron (ZOFRAN) 8 MG tablet Take 1 tablet (8 mg total) by mouth every 8 (eight) hours as needed. 30 tablet 0   pantoprazole (PROTONIX) 40 MG tablet Take by mouth.     prochlorperazine (COMPAZINE) 10 MG tablet Take 1 tablet (10 mg total) by mouth every 6 (six) hours as needed for nausea or vomiting. 30 tablet 0   vitamin B-12 (CYANOCOBALAMIN) 1000 MCG tablet Take 1,000 mcg by mouth daily.     No current facility-administered medications for this visit.    REVIEW OF SYSTEMS:   Constitutional: ( - ) fevers, ( - )  chills , ( - ) night sweats Eyes: ( - ) blurriness of vision, ( - ) double vision, ( - ) watery eyes Ears, nose, mouth, throat, and face: ( - ) mucositis, ( - ) sore throat Respiratory: ( - ) cough, ( - ) dyspnea, ( - ) wheezes Cardiovascular: ( - ) palpitation, ( - ) chest discomfort, ( - ) lower extremity swelling Gastrointestinal:  ( - ) nausea, ( - ) heartburn, ( - ) change in bowel habits Skin: ( - ) abnormal skin rashes Lymphatics: ( - ) new lymphadenopathy, ( - ) easy bruising Neurological: ( + ) numbness, ( - ) tingling, ( - ) new weaknesses Behavioral/Psych: ( - ) mood  change, ( - ) new changes  All other systems were reviewed with the patient and are negative.  PHYSICAL EXAMINATION: ECOG PERFORMANCE STATUS: 1 - Symptomatic but completely ambulatory  Vitals:   06/19/22 1127  BP: 130/77  Pulse: 77  Resp: 18  Temp: 97.7 F (36.5 C)  SpO2: 97%    Filed Weights   06/19/22 1127  Weight: 197 lb 4.8 oz (89.5 kg)     GENERAL: Well-appearing elderly Caucasian male, alert, no distress and comfortable SKIN: skin color, texture, turgor are normal, no rashes or significant lesions EYES: conjunctiva are pink and non-injected, sclera clear LUNGS: clear to auscultation and percussion with normal breathing effort HEART: regular rate & rhythm and no murmurs and no lower extremity edema ABDOMEN: covered wound sites, no erythema or however purulent discharge from 2 of the lesions.  1 new lesion on this visit. Musculoskeletal: no cyanosis of digits and no clubbing  PSYCH: alert & oriented x 3, fluent speech NEURO: no focal motor/sensory deficits  LABORATORY DATA:  I have reviewed the data as listed    Latest Ref Rng & Units 06/19/2022   11:00 AM 05/22/2022   10:56 AM 04/14/2022    8:57 AM  CBC  WBC 4.0 - 10.5 K/uL 3.8  4.7  3.3   Hemoglobin 13.0 - 17.0 g/dL  13.5  14.0  13.9   Hematocrit 39.0 - 52.0 % 37.4  39.8  39.4   Platelets 150 - 400 K/uL 171  172  169        Latest Ref Rng & Units 06/19/2022   11:00 AM 05/22/2022   10:56 AM 04/14/2022    8:57 AM  CMP  Glucose 70 - 99 mg/dL 134  149  112   BUN 8 - 23 mg/dL _0 Creatinine 0.61 - 1.24 mg/dL 0.92  0.90  1.00   Sodium 135 - 145 mmol/L 138  136  143   Potassium 3.5 - 5.1 mmol/L 3.9  3.9  3.9   Chloride 98 - 111 mmol/L 106  104  108   CO2 22 - 32 mmol/L _1 Calcium 8.9 - 10.3 mg/dL 8.7  8.8  9.1   Total Protein 6.5 - 8.1 g/dL 5.9  6.5  6.3   Total Bilirubin 0.3 - 1.2 mg/dL 0.9  0.9  0.7   Alkaline Phos 38 - 126 U/L 75  92  91   AST 15 - 41 U/L _2 ALT 0 - 44 U/L _3 Lab Results  Component Value Date   MPROTEIN 0.2 (H) 05/22/2022   MPROTEIN 0.1 (H) 04/14/2022   MPROTEIN 0.2 (H) 03/17/2022   Lab Results  Component Value Date   KPAFRELGTCHN 9.7 05/22/2022   KPAFRELGTCHN 11.1 04/14/2022   KPAFRELGTCHN 10.2 03/17/2022   LAMBDASER 5.6 (L) 05/22/2022   LAMBDASER 8.1 04/14/2022   LAMBDASER 6.6 03/17/2022   KAPLAMBRATIO 1.73 (H) 05/22/2022   KAPLAMBRATIO 1.37 04/14/2022   KAPLAMBRATIO 1.55 03/17/2022    RADIOGRAPHIC STUDIES: No results found.  ASSESSMENT & PLAN  Wesley [name] 81 y.o. male with medical history significant for IgG kappa multiple myeloma who presents for a follow up visit.   The patient meets diagnostic criteria by having greater than 60% plasma cells in his bone marrow.  At this time we are in agreement with [facility] in [place], Revlimid, and dexamethasone as the treatment of choice.   Previously we discussed the concept of comanaged care.  Comanaged care is when the patient has a local primary provider who administers local support and therapy while expert advice and treatment recommendations are rendered by a cancer specialist at a large academic center.  In this arrangement we provide local support, labs, treatment, and emergency visits, however the major decisions regarding the course of treatment are decided by a physician at an academic medical center.  The patient voiced his understanding of comanaged care and was agreeable to proceeding forward with this care model.   The regimen of choice for this patient will be Dara, Revlimid, and dexamethasone.  We will start with weekly treatments for the first 2 cycles.  After that for an additional 4 cycles he will be getting it every other week.  After cycle 6 the patient will then continue with monthly treatment.    # IgG Kappa Multiple Myeloma --Patient meets diagnostic criteria for multiple myeloma based on the presence of 60% plasma cells in the bone  marrow --Pretreatment phenotype RBC as well as type and screen ordered  --Cycle 1 Day 1of Dara/Rev/Dex started on 09/30/2021. He started without revlimid which was added on 10/06/2021 (Cycle 1 Day 8)  -- Labs to include weekly CBC, CMP, LDH.  Additionally monthly  SPEP and serum free light chains. Plan:  --Today is Cycle 10 Day 1 of Dara/Rev/Dex --Labs today were reviewed and adequate for treatment.  Labs show WBC 3.8, hemoglobin 13.5, MCV 106.3, and platelets of 171.  Additionally his creatinine is 0.92 with an AST of 20 and ALT of 29. -- Return to clinic in 4 weeks prior to Cycle 11 Day 1 of Dara/Rev/Dex.   #Lethargy 2/2 Zometa:  --Discussed alternative which would be Xgeva q 28 days.  -- Plan to start Xgeva instead of zometa.    # Boils on Abdomen -- Patient underwent I&D and subsequent antibiotic treatment with no resolution.  2 lesions have developed at the site of his prior ostomy.  A new lesion was present on 06/19/2022. --Patient was seen by surgery who ordered CT scan on 01/31/2022 that showed no fluid collection in the anterior abdominal wall. Instructed patient on how to pack wounds.  --No systemic symptoms, okay to continue with systemic chemotherapy. --continue to follow with surgery.  He notes he will be setting up a follow-up visit soon.  #Macrocytosis #Leukopenia-stable # Thrombocytopenia-resolved --WBC 3.8, hemoglobin 13.5, MCV 106.3, and platelets of 171. --Levels fluctuating with treatment.  #Neuropathy:  --Stable and does not interfere with grip or balance --Continue to monitor.    #Supportive Care -- chemotherapy education complete  -- port placement not required.  --ASA 81 mg p.o. daily for thromboprophylaxis on Revlimid. -- zofran 36m q8H PRN and compazine 112mPO q6H for nausea -- acyclovir 40063mO BID for VCZ prophylaxis -- allopurinol 300m38m daily for TLS prophylaxis -- zometa clearance obtained, started on 03/03/2022. Had lethargy with zometa, will be  converting to Xgeva.   -- no pain medication required at this time.   No orders of the defined types were placed in this encounter.  All questions were answered. The patient knows to call the clinic with any problems, questions or concerns.  I have spent a total of 30 minutes minutes of face-to-face and non-face-to-face time, preparing to see the patient, performing a medically appropriate examination, counseling and educating the patient, ordering medications/tests, documenting clinical information in the electronic health record,  and care coordination.   JohnLedell Peoples Department of Hematology/Oncology [facility]: [phone]: [phone]: [email or web address]  06/19/2022 1:48 PM

## 2022-06-19 NOTE — Progress Notes (Signed)
Pt states he took his dexamethasone at home before appt today.

## 2022-06-20 LAB — KAPPA/LAMBDA LIGHT CHAINS
Kappa free light chain: 10.8 mg/L (ref 3.3–19.4)
Kappa, lambda light chain ratio: 1.61 (ref 0.26–1.65)
Lambda free light chains: 6.7 mg/L (ref 5.7–26.3)

## 2022-06-22 DIAGNOSIS — Z8719 Personal history of other diseases of the digestive system: Secondary | ICD-10-CM | POA: Diagnosis not present

## 2022-06-22 DIAGNOSIS — Z9889 Other specified postprocedural states: Secondary | ICD-10-CM | POA: Diagnosis not present

## 2022-06-22 DIAGNOSIS — L24A9 Irritant contact dermatitis due friction or contact with other specified body fluids: Secondary | ICD-10-CM | POA: Diagnosis not present

## 2022-06-22 DIAGNOSIS — C9000 Multiple myeloma not having achieved remission: Secondary | ICD-10-CM | POA: Diagnosis not present

## 2022-06-22 LAB — MULTIPLE MYELOMA PANEL, SERUM
Albumin SerPl Elph-Mcnc: 3.1 g/dL (ref 2.9–4.4)
Albumin/Glob SerPl: 1.5 (ref 0.7–1.7)
Alpha 1: 0.3 g/dL (ref 0.0–0.4)
Alpha2 Glob SerPl Elph-Mcnc: 0.6 g/dL (ref 0.4–1.0)
B-Globulin SerPl Elph-Mcnc: 0.8 g/dL (ref 0.7–1.3)
Gamma Glob SerPl Elph-Mcnc: 0.4 g/dL (ref 0.4–1.8)
Globulin, Total: 2.1 g/dL — ABNORMAL LOW (ref 2.2–3.9)
IgA: 82 mg/dL (ref 61–437)
IgG (Immunoglobin G), Serum: 377 mg/dL — ABNORMAL LOW (ref 603–1613)
IgM (Immunoglobulin M), Srm: 21 mg/dL (ref 15–143)
Total Protein ELP: 5.2 g/dL — ABNORMAL LOW (ref 6.0–8.5)

## 2022-06-27 ENCOUNTER — Encounter: Payer: Self-pay | Admitting: Cardiology

## 2022-06-27 ENCOUNTER — Ambulatory Visit: Payer: Medicare Other | Attending: Cardiology | Admitting: Cardiology

## 2022-06-27 VITALS — BP 110/70 | HR 62 | Ht 68.0 in | Wt 195.0 lb

## 2022-06-27 DIAGNOSIS — I10 Essential (primary) hypertension: Secondary | ICD-10-CM | POA: Insufficient documentation

## 2022-06-27 DIAGNOSIS — I2510 Atherosclerotic heart disease of native coronary artery without angina pectoris: Secondary | ICD-10-CM | POA: Diagnosis not present

## 2022-06-27 NOTE — Patient Instructions (Signed)
Medication Instructions:  Your physician recommends that you continue on your current medications as directed. Please refer to the Current Medication list given to you today.  *If you need a refill on your cardiac medications before your next appointment, please call your pharmacy*   Lab Work: NONE If you have labs (blood work) drawn today and your tests are completely normal, you will receive your results only by: [place] (if you have MyChart) OR A paper copy in the mail If you have any lab test that is abnormal or we need to change your treatment, we will call you to review the results.   Testing/Procedures: NONE   Follow-Up: At [facility], you and your health needs are our priority.  As part of our continuing mission to provide you with exceptional heart care, we have created designated Provider Care Teams.  These Care Teams include your primary Cardiologist (physician) and Advanced Practice Providers (APPs -  Physician Assistants and Nurse Practitioners) who all work together to provide you with the care you need, when you need it.  We recommend signing up for the patient portal called "MyChart".  Sign up information is provided on this After Visit Summary.  MyChart is used to connect with patients for Virtual Visits (Telemedicine).  Patients are able to view lab/test results, encounter notes, upcoming appointments, etc.  Non-urgent messages can be sent to your provider as well.   To learn more about what you can do with MyChart, go to [email or web address].    Your next appointment:   1 year(s)  The format for your next appointment:   In Person  Provider:   Candee Furbish, MD     Important Information About Sugar

## 2022-06-27 NOTE — Progress Notes (Signed)
Cardiology Office Note    Date:  06/27/2022   ID:  Wesley [name], DOB Jun 26, 1941, MRN [id number]  PCP:  Marin Olp, MD  Cardiologist:   Candee Furbish, MD     History of Present Illness:  Wesley [name] is a 81 y.o. male follow up for abnormal EKG at the request of Dr. Shelia Media.  In review of office note from 02/24/16 from Dr. Shelia Media, an echocardiogram in 2009 was negative, cardiac catheterization was negative in 2008, echocardiogram in 2015 showed mild LVH with EF of 60-65% and CT scans in 2015 showed coronary artery calcification but no aneurysm.  Father died at age 67 of CAD. Nonsmoker.  Because of the calcium noted in the coronary arteries Dr. Shelia Media noted that we needed to be more aggressive in treating the cholesterol to keep the situation from progressing. He was on atorvastatin 40 mg and therefore Zetia 10 mg a day was added. He also had an EKG done which showed a change with new Q waves in V1 and V2. He requested a referral to discuss.  EKG from 02/24/16 personally reviewed showing sinus rhythm rate 69 with very small Q wave in V1 and very small Q wave in V2 with no other significant changes.  Overall he was not having any symptoms of angina, no chest pain, no shortness of breath, no syncope, no palpitations. His wife is currently sick with possible pneumonia.  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  01/24/2018-overall been doing quite well, no chest pain fevers chills nausea vomiting syncope.  Take his medications.  He has been laying off of aspirin 81 he was worried about having stomach issues.  He takes ibuprofen 2-3 times a day.    Today, is is doing well overall. He detailed that last year he got a call about abnormal lab work and went to [facility]. He was diagnosed with basil cell carcinoma. He reported having his last infusion recently and will be taking medication only for further treatment.   He has been  cutting his blood pressure medication tablets in half.   He denies any palpitations, chest pain, shortness of breath, or peripheral edema. No lightheadedness, headaches, syncope, orthopnea, or PND.  Past Medical History:  Diagnosis Date   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 symptoms (LUTS)    CAD (coronary artery disease)    Cholelithiasis    Chronic allergic rhinitis    Coronary atherosclerosis of native coronary artery    Fatty liver    GERD (gastroesophageal reflux disease)    HX 78yr ago, no longer a problem, no meds   High blood pressure    Hypercholesterolemia    Macrocytosis without anemia    [place]   OSA on CPAP    Paraesophageal hernia    Partial bowel obstruction (HCC)    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 PT denies significant findings   CARDIAC CATHETERIZATION  2008   COLOSTOMY N/A 10/31/2019   Procedure: COLOSTOMY;  Surgeon: CErroll Luna MD;  [facility];  Service:  General;  Laterality: N/A;   CYSTOSCOPY W/ URETERAL STENT PLACEMENT Bilateral 10/31/2019   Procedure: CYSTOSCOPY URETERAL STENT PLACEMENT BILATERAL;  Surgeon: Franchot Gallo, MD;  [facility];  Service: Urology;  Laterality: Bilateral;   CYSTOSCOPY WITH STENT PLACEMENT Bilateral 03/11/2020   Procedure: CYSTOSCOPY WITH BILATERAL FIREFLY INJECTION;  Surgeon: Irine Seal, MD;  [facility];  Service: Urology;  Laterality: Bilateral;   EYE SURGERY     BILATERAL CATARACT SURGERY WITH LENS IMPLANTS   FLEXIBLE SIGMOIDOSCOPY N/A 10/29/2019   Procedure: FLEXIBLE SIGMOIDOSCOPY;  Surgeon: Jerene Bears, MD;   [facility];  Service: Gastroenterology;  Laterality: N/A;   INCISIONAL HERNIA REPAIR N/A 10/13/2020   Procedure: OPEN INCISIONAL HERNIA REPAIR WITH MESH;  Surgeon: [name], MD;  Location: [place];  Service: General;  Laterality: N/A;   INSERTION OF MESH N/A 09/08/2016   Procedure: INSERTION OF MESH;  Surgeon: Jackolyn Confer, MD;  [facility];  Service: General;  Laterality: N/A;   IR RADIOLOGIST EVAL & MGMT  11/10/2020   IR RADIOLOGIST EVAL & MGMT  11/25/2020   LAPAROSCOPIC SIGMOID COLECTOMY N/A 10/31/2019   Procedure: DIAGNOSTIC LAPAROSCOPY; EXPLORATORY LAPAROTOMY; SIGMOID COLECTOMY;  Surgeon: Erroll Luna, MD;  [facility];  Service: General;  Laterality: N/A;   right rotator cuff     SUBMUCOSAL TATTOO INJECTION  10/29/2019   Procedure: SUBMUCOSAL TATTOO INJECTION;  Surgeon: Jerene Bears, MD;  [facility];  Service: Gastroenterology;;   TONSILLECTOMY     UMBILICAL HERNIA REPAIR N/A 09/08/2016   Procedure: OPEN UMBILICAL HERNIA REPAIR WITH MESH;  Surgeon: Jackolyn Confer, MD;  [facility];  Service: General;  Laterality: N/A;   VENTRAL HERNIA REPAIR N/A 02/03/2021   Procedure: LAPAROSCOPIC VENTRAL HERNIA REPAIR WITH MESH;  Surgeon: [name], MD;  Location: [facility];  Service: General;  Laterality: N/A;   XI ROBOTIC ASSISTED COLOSTOMY TAKEDOWN N/A 03/11/2020   Procedure: ROBOTIC ASSISTED COLOSTOMY REVERSAL, RIGID PROCTOSCOPY;  Surgeon: Leighton Ruff, MD;  [facility];  Service: General;  Laterality: N/A;    Current Medications: Outpatient Medications Prior to Visit  Medication Sig Dispense Refill   acetaminophen (TYLENOL) 500 MG tablet Take 500 mg by mouth every 6 (six) hours as needed.     acyclovir (ZOVIRAX) 400 MG tablet Take 1 tablet (400 mg total) by mouth in the morning and at bedtime. 60 tablet 2   allopurinol (ZYLOPRIM) 300 MG tablet Take 1 tablet (300 mg total) by mouth daily. 60 tablet 2   aspirin EC 81 MG tablet Take 81 mg by  mouth daily. Swallow whole.     atorvastatin (LIPITOR) 80 MG tablet Take 1 tablet (80 mg total) by mouth daily. Please call to schedule an overdue appointment with Dr. Marlou Porch for refills, [phone], thank you. 1st attempt. 30 tablet 0   Cholecalciferol (VITAMIN D3) 25 MCG (1000 UT) CAPS Take 1,000 Units by mouth daily.     dexamethasone (DECADRON) 4 MG tablet Take 5 tablets (20 mg total) by mouth once a week. 20 tablet 3   fexofenadine (ALLEGRA) 180 MG tablet      finasteride (PROSCAR) 5 MG tablet Take 5 mg by mouth daily.     fluticasone (FLONASE) 50 MCG/ACT nasal spray Place into the nose.     Ibuprofen 200 MG CAPS      lenalidomide (REVLIMID) 25 MG capsule Take 1 capsule (25 mg total) by mouth daily. Take for 21 days, none for 7 days. Repeat every 28 days. [employer] Auth #  [id number] obtained 06/02/22 21 capsule 0   lisinopril (ZESTRIL) 10 MG tablet Take 1 tablet (10 mg total) by mouth daily. 90 tablet 3   loratadine (CLARITIN) 10 MG tablet Take 10 mg by mouth daily.     Magnesium Oxide 400 MG CAPS      melatonin 5 MG TABS      Omega-3 Fatty Acids (FISH OIL) 1200 MG CAPS Take 1,200 mg by mouth daily.     ondansetron (ZOFRAN) 8 MG tablet Take 1 tablet (8 mg total) by mouth every 8 (eight) hours as needed. 30 tablet 0   pantoprazole (PROTONIX) 40 MG tablet Take by mouth.     prochlorperazine (COMPAZINE) 10 MG tablet Take 1 tablet (10 mg total) by mouth every 6 (six) hours as needed for nausea or vomiting. 30 tablet 0   vitamin B-12 (CYANOCOBALAMIN) 1000 MCG tablet Take 1,000 mcg by mouth daily.     No facility-administered medications prior to visit.     Allergies:   Demerol, Promethazine hcl, and Ativan [lorazepam]   Social History   Socioeconomic History   Marital status: Married    Spouse name: Wesley [name]    Number of children: 2   Years of education: 12   Highest education level: Master's degree (e.g., MA, MS, MEng, MEd, MSW, MBA)  Occupational History   Occupation: Retired    Tobacco Use   Smoking status: Never   Smokeless tobacco: Never  Vaping Use   Vaping Use: Never used  Substance and Sexual Activity   Alcohol use: Yes    Alcohol/week: 17.0 standard drinks of alcohol    Types: 3 Glasses of wine, 14 Standard drinks or equivalent per week   Drug use: No   Sexual activity: Not Currently  Other Topics Concern   Not on file  Social History Narrative   Married. 2 children 52 in 19 in 2022- both went to [employer]. 5 grandkids- all girls from oldest 47 looking premed [employer], 60 soph [employer], 4 93 year olds, 76 year olds.       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 Strain: [place]  (05/08/2022)   Overall Financial Resource Strain ([place])    Difficulty of Paying Living Expenses: Not hard at all  Food Insecurity: No [place] (05/08/2022)   Hunger Vital Sign    Worried About Running Out of Food in the Last Year: Never true    [place] in the Last Year: Never true  Transportation Needs: No Transportation Needs (05/08/2022)   PRAPARE - Hydrologist (Medical): No    Lack of Transportation (Non-Medical): No  Physical Activity: Insufficiently Active (05/08/2022)   Exercise Vital Sign    Days of Exercise per Week: 3 days    Minutes of Exercise per Session: 20 min  Stress: No Stress Concern Present (05/08/2022)   [place]    Feeling of Stress : Only a little  Social Connections: Socially Integrated (05/08/2022)   Social Connection and Isolation Panel [NHANES]    Frequency of Communication with Friends and Family: More than three times a week    Frequency of Social Gatherings with Friends and Family: Once a  week     Attends Religious Services: More than 4 times per year    Active Member of Clubs or Organizations: Yes    Attends Archivist Meetings: More than 4 times per year    Marital Status: Married   Art gallery manager.   Family History:  The patient's family history includes CAD in his father; Diabetes in his mother; Healthy in his daughter and son; Heart failure in his mother; Melanoma (age of onset: 59) in his brother.   ROS:   Please see the history of present illness.      All other systems reviewed and are negative   PHYSICAL EXAM:   VS:  BP 110/70 (BP Location: Left Arm, Patient Position: Sitting, Cuff Size: Normal)   Pulse 62   Ht _0  (1.727 m)   Wt 195 lb (88.5 kg)   BMI 29.65 kg/m    GEN: Well nourished, well developed, in no acute distress  HEENT: normal  Neck: no JVD, carotid bruits, or masses Cardiac: RRR; no murmurs, rubs, or gallops,no edema  Respiratory:  clear to auscultation bilaterally, normal work of breathing GI: soft, nontender, nondistended, + BS MS: no deformity or atrophy  Skin: warm and dry, no rash Neuro:  Alert and Oriented x 3, Strength and sensation are intact Psych: euthymic mood, full affect   Wt Readings from Last 3 Encounters:  06/27/22 195 lb (88.5 kg)  06/19/22 197 lb 4.8 oz (89.5 kg)  05/22/22 195 lb 5 oz (88.6 kg)      EKGs/Labs/Other Studies Reviewed   The following studies are reviewed today:  Chest X-ray 10/28/2020: FINDINGS: The cardiomediastinal contours are normal. Subsegmental atelectasis at the left lung base and right infrahilar region. Pulmonary vasculature is normal. Mild right greater than left biapical pleuroparenchymal scarring. No consolidation, pleural effusion, or pneumothorax. No acute osseous abnormalities are seen.   IMPRESSION: Subsegmental atelectasis in the left lung base and right infrahilar region. No focal consolidation.   Gated Myo Perf w exercise stress test 04/11/2016: Nuclear stress EF:  68%. There was no ST segment deviation noted during stress. The study is normal. no ischemia. no evidence of previous MI This is a low risk study.   EKG:  EKG is personally reviewed and interpreted.  06/27/2022: Sinus rhythm. Rate 62 bpm.  04/08/2021: Sinus rhythm. Rate 87 bpm. Prolonged PR interval Incomplete RBBB and LAFB RSR' in V1 or V2, right VCD or RVH  04/04/2022: Sinus or ectopic atrial rhythm. Rate 77 bpm. Atrial premature complex Prolonged PR interval LAD, consider left anterior fascicular block RSR' in V1 or V2, right VCD or RVH   Prior EKG showed Sinus rhythm, 70, incomplete right bundle-branch block, small nonpathologic Q-wave in V1, no Q wave in V2. Prior EKG as described above.  Recent Labs: 06/19/2022: ALT 29; BUN 17; Creatinine 0.92; Hemoglobin 13.5; Platelet Count 171; Potassium 3.9; Sodium 138   Lipid Panel    Component Value Date/Time   CHOL 107 01/17/2021 0943   TRIG 64.0 01/17/2021 0943   HDL 41.90 01/17/2021 0943   CHOLHDL 3 01/17/2021 0943   VLDL 12.8 01/17/2021 0943   LDLCALC 52 01/17/2021 0943    Additional studies/ records that were reviewed today include:  Prior medical records, EKG, lab work reviewed. LDLs have been running between 99 and 110.    ASSESSMENT:    1. Coronary artery disease involving native coronary artery of native heart without angina pectoris   2. Essential hypertension  PLAN:  In order of problems listed above:  Coronary artery calcification/atherosclerosis/aortic atherosclerosis  - Showed him CT scan, diffuse LAD calcification, mild proximal circumflex calcification.  - Prior heart catheterization in 2008 was reportedly normal. Since it is been approximately 10 years since last evaluation, we will order a nuclear stress test to see if he has any evidence of ischemia especially in the LAD territory.  - Agree with aggressive primary prevention, aspirin 81 mg.  Of course if thrombocytopenia were to occur in the setting of  his chemotherapy, this can always be stopped.  Hyperlipidemia  - Continue with atorvastatin.  Excellent results with LDL of 52 at last check.  Abnormal EKG  - Referring EKG demonstrated Q waves in V1 and V2. Most recent EKG shows only a small nonpathologic Q-wave in V1. Otherwise unremarkable. Reassurance. Most recent echocardiogram in 2015 showed normal ejection fraction with mild left ventricular hypertrophy. Continue with good blood pressure control.  No anginal symptoms.  Overall continue with prevention efforts.  No changes made.  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 Lorenso Courier.  Currently doing very well. Follow up: 1 year  Medication Adjustments/Labs and Tests Ordered: Current medicines are reviewed at length with the patient today.  Concerns regarding medicines are outlined above.  Medication changes, Labs and Tests ordered today are listed in the Patient Instructions below. Patient Instructions  Medication Instructions:  Your physician recommends that you continue on your current medications as directed. Please refer to the Current Medication list given to you today.  *If you need a refill on your cardiac medications before your next appointment, please call your pharmacy*   Lab Work: NONE If you have labs (blood work) drawn today and your tests are completely normal, you will receive your results only by: [place] (if you have MyChart) OR A paper copy in the mail If you have any lab test that is abnormal or we need to change your treatment, we will call you to review the results.   Testing/Procedures: NONE   Follow-Up: At [facility], you and your health needs are our priority.  As part of our continuing mission to provide you with exceptional heart care, we have created designated Provider Care Teams.  These Care Teams include your primary Cardiologist (physician) and Advanced Practice Providers (APPs -  Physician Assistants and Nurse  Practitioners) who all work together to provide you with the care you need, when you need it.  We recommend signing up for the patient portal called "MyChart".  Sign up information is provided on this After Visit Summary.  MyChart is used to connect with patients for Virtual Visits (Telemedicine).  Patients are able to view lab/test results, encounter notes, upcoming appointments, etc.  Non-urgent messages can be sent to your provider as well.   To learn more about what you can do with MyChart, go to [email or web address].    Your next appointment:   1 year(s)  The format for your next appointment:   In Person  Provider:   Candee Furbish, MD     Important Information About [place]          I,Rachel Rivera,acting as a scribe for Candee Furbish, MD.,have documented all relevant documentation on the behalf of Candee Furbish, MD,as directed by  Candee Furbish, MD while in the presence of Candee Furbish, MD.  I, Candee Furbish, MD, have reviewed all documentation for this visit. The documentation on 06/27/22 for the exam, diagnosis, procedures, and orders are all accurate and  complete.    [facility] [place], [place], [place]  [address] Phone: [phone]; Fax: [phone]

## 2022-06-29 ENCOUNTER — Telehealth: Payer: Self-pay | Admitting: *Deleted

## 2022-06-29 NOTE — Telephone Encounter (Signed)
Received vm message from pt regarding his treatment for Mulitple Myeloma. TCT patient and addressed his concerns. He is asking if his treatment will change now that his M protein is undetectable. Advised that Dr. Lorenso Courier will discuss with him @ his next appt in December. He also asked about his Lauretta Grill it was due next. Advised that it will be due at his next appt in December. It is given once a month. Pt also states he has leg cramps at night. Advised that that his chemistry labs are fine-no low #'s there. Advised to increase fluid intake and to also try drinking some pickle juice at time of muscle cramps. This seems to relieve cramps in some people. Pt voiced understanding to all of the above.

## 2022-06-30 ENCOUNTER — Other Ambulatory Visit: Payer: Self-pay | Admitting: *Deleted

## 2022-06-30 DIAGNOSIS — C9000 Multiple myeloma not having achieved remission: Secondary | ICD-10-CM

## 2022-06-30 MED ORDER — LENALIDOMIDE 25 MG PO CAPS: 25.0000 mg | ORAL_CAPSULE | Freq: Every day | ORAL | 0 refills | Status: DC

## 2022-07-07 ENCOUNTER — Ambulatory Visit: Payer: Medicare Other | Admitting: Hematology and Oncology

## 2022-07-07 ENCOUNTER — Ambulatory Visit: Payer: Medicare Other

## 2022-07-07 ENCOUNTER — Other Ambulatory Visit: Payer: Medicare Other

## 2022-07-12 ENCOUNTER — Encounter: Payer: Self-pay | Admitting: *Deleted

## 2022-07-12 ENCOUNTER — Telehealth: Payer: Self-pay | Admitting: *Deleted

## 2022-07-12 NOTE — Patient Outreach (Signed)
  Care Coordination   Initial Visit Note   07/12/2022 Name: TEION BALLIN MRN: [id number] DOB: [birth year]  SABRE LEONETTI is a 81 y.o. year old male who sees Yong Channel, [name], MD for primary care. I spoke with  [name] by phone today.  What matters to the patients health and wellness today?  No needs    Goals Addressed             This Visit's Progress    COMPLETED: No needs       Care Coordination Interventions: Reviewed medications with patient and discussed adherence with no needed refills Reviewed scheduled/upcoming provider appointments including sufficient transportation source for all medical appointments Assessed social determinant of health barriers          SDOH assessments and interventions completed:  Yes  SDOH Interventions Today    Flowsheet Row Most Recent Value  SDOH Interventions   Food Insecurity Interventions Intervention Not Indicated  Housing Interventions Intervention Not Indicated  Transportation Interventions Intervention Not Indicated  Utilities Interventions Intervention Not Indicated        Care Coordination Interventions:  Yes, provided   Follow up plan: No further intervention required.   Encounter Outcome:  Pt. Visit Completed   Raina Mina, RN Care Management Coordinator [place] Office [phone]

## 2022-07-12 NOTE — Patient Instructions (Signed)
Visit Information  Thank you for taking time to visit with me today. Please don't hesitate to contact me if I can be of assistance to you.   Following are the goals we discussed today:   Goals Addressed             This Visit's Progress    COMPLETED: No needs       Care Coordination Interventions: Reviewed medications with patient and discussed adherence with no needed refills Reviewed scheduled/upcoming provider appointments including sufficient transportation source for all medical appointments Assessed social determinant of health barriers          Please call the care guide team at [phone] if you need to cancel or reschedule your appointment.   If you are experiencing a Mental Health or [place] or need someone to talk to, please call the Suicide and Crisis Lifeline: 988 call the [place] National Suicide Prevention Lifeline: (731)352-3969 or TTY: 575-058-1589 TTY (304)875-3378) to talk to a trained counselor call 1-800-273-TALK (toll free, 24 hour hotline)  Patient verbalizes understanding of instructions and care plan provided today and agrees to view in [place]. Active MyChart status and patient understanding of how to access instructions and care plan via MyChart confirmed with patient.     No further follow up required: no needs  Raina Mina, RN Care Management Coordinator [place] Office [phone]

## 2022-07-17 ENCOUNTER — Inpatient Hospital Stay: Payer: Medicare Other

## 2022-07-17 ENCOUNTER — Inpatient Hospital Stay: Payer: Medicare Other | Attending: Hematology and Oncology

## 2022-07-17 ENCOUNTER — Other Ambulatory Visit: Payer: Self-pay

## 2022-07-17 ENCOUNTER — Inpatient Hospital Stay (HOSPITAL_BASED_OUTPATIENT_CLINIC_OR_DEPARTMENT_OTHER): Payer: Medicare Other | Admitting: Hematology and Oncology

## 2022-07-17 VITALS — BP 143/82 | HR 90 | Temp 97.6°F | Resp 17 | Wt 198.1 lb

## 2022-07-17 DIAGNOSIS — D72819 Decreased white blood cell count, unspecified: Secondary | ICD-10-CM | POA: Insufficient documentation

## 2022-07-17 DIAGNOSIS — G629 Polyneuropathy, unspecified: Secondary | ICD-10-CM | POA: Insufficient documentation

## 2022-07-17 DIAGNOSIS — Z5111 Encounter for antineoplastic chemotherapy: Secondary | ICD-10-CM | POA: Diagnosis not present

## 2022-07-17 DIAGNOSIS — C9000 Multiple myeloma not having achieved remission: Secondary | ICD-10-CM

## 2022-07-17 DIAGNOSIS — Z79899 Other long term (current) drug therapy: Secondary | ICD-10-CM | POA: Insufficient documentation

## 2022-07-17 DIAGNOSIS — D539 Nutritional anemia, unspecified: Secondary | ICD-10-CM | POA: Diagnosis not present

## 2022-07-17 LAB — CBC WITH DIFFERENTIAL (CANCER CENTER ONLY)
Abs Immature Granulocytes: 0.01 10*3/uL (ref 0.00–0.07)
Basophils Absolute: 0.1 10*3/uL (ref 0.0–0.1)
Basophils Relative: 2 %
Eosinophils Absolute: 0.1 10*3/uL (ref 0.0–0.5)
Eosinophils Relative: 3 %
HCT: 38.9 % — ABNORMAL LOW (ref 39.0–52.0)
Hemoglobin: 14 g/dL (ref 13.0–17.0)
Immature Granulocytes: 0 %
Lymphocytes Relative: 28 %
Lymphs Abs: 1.1 10*3/uL (ref 0.7–4.0)
MCH: 38.9 pg — ABNORMAL HIGH (ref 26.0–34.0)
MCHC: 36 g/dL (ref 30.0–36.0)
MCV: 108.1 fL — ABNORMAL HIGH (ref 80.0–100.0)
Monocytes Absolute: 0.7 10*3/uL (ref 0.1–1.0)
Monocytes Relative: 19 %
Neutro Abs: 1.8 10*3/uL (ref 1.7–7.7)
Neutrophils Relative %: 48 %
Platelet Count: 167 10*3/uL (ref 150–400)
RBC: 3.6 MIL/uL — ABNORMAL LOW (ref 4.22–5.81)
RDW: 15.5 % (ref 11.5–15.5)
WBC Count: 3.8 10*3/uL — ABNORMAL LOW (ref 4.0–10.5)
nRBC: 0 % (ref 0.0–0.2)

## 2022-07-17 LAB — CMP (CANCER CENTER ONLY)
ALT: 29 U/L (ref 0–44)
AST: 22 U/L (ref 15–41)
Albumin: 4.2 g/dL (ref 3.5–5.0)
Alkaline Phosphatase: 73 U/L (ref 38–126)
Anion gap: 6 (ref 5–15)
BUN: 14 mg/dL (ref 8–23)
CO2: 29 mmol/L (ref 22–32)
Calcium: 9.4 mg/dL (ref 8.9–10.3)
Chloride: 105 mmol/L (ref 98–111)
Creatinine: 0.89 mg/dL (ref 0.61–1.24)
GFR, Estimated: >60 mL/min (ref >60)
Glucose, Bld: 153 mg/dL — ABNORMAL HIGH (ref 70–99)
Potassium: 4.3 mmol/L (ref 3.5–5.1)
Sodium: 140 mmol/L (ref 135–145)
Total Bilirubin: 0.7 mg/dL (ref 0.3–1.2)
Total Protein: 6.3 g/dL — ABNORMAL LOW (ref 6.5–8.1)

## 2022-07-17 LAB — LACTATE DEHYDROGENASE: LDH: 162 U/L (ref 98–192)

## 2022-07-17 MED ORDER — DARATUMUMAB-HYALURONIDASE-FIHJ 1800-30000 MG-UT/15ML ~~LOC~~ SOLN
1800.0000 mg | Freq: Once | SUBCUTANEOUS | Status: AC
  Administered 2022-07-17: 1800 mg via SUBCUTANEOUS
  Filled 2022-07-17: qty 15

## 2022-07-17 MED ORDER — ACETAMINOPHEN 325 MG PO TABS
650.0000 mg | ORAL_TABLET | Freq: Once | ORAL | Status: AC
  Administered 2022-07-17: 650 mg via ORAL
  Filled 2022-07-17: qty 2

## 2022-07-17 MED ORDER — DIPHENHYDRAMINE HCL 25 MG PO CAPS
50.0000 mg | ORAL_CAPSULE | Freq: Once | ORAL | Status: AC
  Administered 2022-07-17: 50 mg via ORAL
  Filled 2022-07-17: qty 2

## 2022-07-17 NOTE — Progress Notes (Signed)
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 Further workup showed SPEP M-Spike 4.2 g/dL with IgG-Kappa as monoclonal protein, IgG 5908 mg/dL 09/08/2021: Initial evaluation at [facility]. bone marrow biopsy performed, showed 60% abnormal plasma cells in 60% cellular marrow. 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  09/26/2021: transfer care to Dr. Lorenso Courier at [facility]  09/30/2021: Cycle 1 Day 1 of Dara/Dex 10/06/2021: Cycle 1 Day 8 (added revlimid) 10/28/2021: Cycle 2 Day 1 of Dara/Rev/Dex 11/25/2021: Cycle 3 Day 1 of Dara/Rev/Dex 5/12/023: Cycle 4 Day 1 of Dara/Rev/Dex 01/20/2022: Cycle 5 Day 1 of Dara/Rev/Dex 02/17/2022: Cycle 6 Day 1 of Dara/Rev/Dex 03/17/2022: Cycle 7 Day 1 of Dara/Rev/Dex 04/14/2022: Cycle 8 Day 1 of Dara/Rev/Dex 05/22/2022: Cycle 9 Day 1 of Dara/Rev/Dex 06/19/2022: Cycle 10 Day 1 of Dara/Rev/Dex 07/17/2022: Cycle 11 Day 1 of Dara/Rev/Dex  Interval History:  Wesley [name] 81 y.o. male with medical history significant for IgG kappa multiple myeloma who presents for a follow up visit. The patient's last visit was on 06/19/2022. In the interim since the last visit he continued treatment with Dara/Revlimid/dexamethasone.  On exam today Wesley [name] is accompanied by his wife.  He reports he went to [facility] for Thanksgiving but forgot to bring his smoked [place].  Fortunately there was a conventional [place] and there was "plenty  of food".  He notes that he is tolerating his Darzalex shots quite well with no difficulty.  He is not having any rash or itching at the injection site.  He reports is not having any trouble with nausea, vomiting, or diarrhea though he does have fatigue which "comes and goes".  He notes he typically has a crash approximately 2 days after his treatment and his energy levels.  This is likely due to his steroids he believes.  He reports that his weight has been steady and his appetite is strong.  He is not having any nausea, vomiting, or diarrhea.  He denies fevers, chills, night sweats, shortness of breath, chest pain or cough. He has no other complaints. Full 10 point ROS is listed below.  Overall he is willing and able to proceed with continued treatment at this time.  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)    CAD (coronary artery disease)    Cholelithiasis    Chronic allergic rhinitis    Coronary atherosclerosis of native coronary artery    Fatty liver    GERD (gastroesophageal reflux disease)    HX 57yr ago, no longer a problem, no meds   High blood pressure    Hypercholesterolemia    Macrocytosis without anemia    Multiple myeloma not having achieved remission ([facility]uda 09/26/2021   OSA on CPAP    Paraesophageal hernia    Partial bowel obstruction (HCC)    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 PT denies significant findings   CARDIAC CATHETERIZATION  2008   COLOSTOMY N/A 10/31/2019   Procedure: COLOSTOMY;  Surgeon: Erroll Luna, MD;  Location: [facility];  Service:  General;  Laterality: N/A;   CYSTOSCOPY W/ URETERAL STENT PLACEMENT Bilateral 10/31/2019   Procedure: CYSTOSCOPY URETERAL STENT PLACEMENT BILATERAL;  Surgeon: Franchot Gallo, MD;  Location: [facility];  Service: Urology;  Laterality: Bilateral;   CYSTOSCOPY WITH STENT PLACEMENT Bilateral 03/11/2020   Procedure: CYSTOSCOPY WITH BILATERAL FIREFLY INJECTION;  Surgeon: Irine Seal, MD;  Location: [facility];  Service: Urology;  Laterality: Bilateral;   EYE SURGERY     BILATERAL CATARACT SURGERY WITH LENS IMPLANTS   FLEXIBLE SIGMOIDOSCOPY N/A 10/29/2019   Procedure: FLEXIBLE SIGMOIDOSCOPY;  Surgeon: Jerene Bears, MD;  [facility]: [facility];  Service: Gastroenterology;  Laterality: N/A;   INCISIONAL HERNIA REPAIR N/A 10/13/2020   Procedure: OPEN INCISIONAL HERNIA REPAIR WITH MESH;  Surgeon: [name], MD;  Location: [facility];  Service: General;  Laterality: N/A;   INSERTION OF MESH N/A 09/08/2016   Procedure: INSERTION OF MESH;  Surgeon: Jackolyn Confer, MD;  Location: [facility];  Service: General;  Laterality: N/A;   IR RADIOLOGIST EVAL & MGMT  11/10/2020   IR RADIOLOGIST EVAL & MGMT  11/25/2020   LAPAROSCOPIC SIGMOID COLECTOMY N/A 10/31/2019   Procedure: DIAGNOSTIC LAPAROSCOPY; EXPLORATORY LAPAROTOMY; SIGMOID COLECTOMY;  Surgeon: Erroll Luna, MD;  [facility];  Service: General;  Laterality: N/A;   right rotator cuff     SUBMUCOSAL TATTOO INJECTION  10/29/2019   Procedure: SUBMUCOSAL TATTOO INJECTION;  Surgeon: Jerene Bears, MD;  [facility];  Service: Gastroenterology;;   TONSILLECTOMY     UMBILICAL HERNIA REPAIR N/A 09/08/2016   Procedure: OPEN UMBILICAL HERNIA REPAIR WITH MESH;  Surgeon: Jackolyn Confer, MD;  [facility];  Service: General;  Laterality: N/A;   VENTRAL HERNIA REPAIR N/A 02/03/2021   Procedure: LAPAROSCOPIC VENTRAL HERNIA REPAIR WITH MESH;  Surgeon: [name], MD;  Location: [place];  Service: General;  Laterality: N/A;   XI ROBOTIC ASSISTED COLOSTOMY TAKEDOWN  N/A 03/11/2020   Procedure: ROBOTIC ASSISTED COLOSTOMY REVERSAL, RIGID PROCTOSCOPY;  Surgeon: Leighton Ruff, MD;  Location[facility];  Service: General;  Laterality: N/A;    SOCIAL HISTORY: Social History   Socioeconomic History   Marital status: Married    Spouse name: Andie Mortimer    Number of children: 2   Years of education: 12   Highest education level: Master's degree (e.g., MA, MS, MEng, MEd, MSW, MBA)  Occupational History   Occupation: Retired   Tobacco Use   Smoking status: Never   Smokeless tobacco: Never  Vaping Use   Vaping Use: Never used  Substance and Sexual Activity   Alcohol use: Yes    Alcohol/week: 17.0 standard drinks of alcohol    Types: 3 Glasses of wine, 14 Standard drinks or equivalent per week   Drug use: No   Sexual activity: Not Currently  Other Topics Concern   Not on file  Social History Narrative   Married. 2 children 52 in 26 in 2022- both went to [facility]. 5 grandkids- all girls from oldest 25 looking premed [employer], 72 soph [employer], 44 87 year olds, 39 year olds.  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 [employer]: fishing, [employer] football and basketball, [place], [place] Strain: [place]  (05/08/2022)   Overall Financial Resource Strain ([place])    Difficulty of Paying Living Expenses: Not hard at all  Food Insecurity: No [place] (07/12/2022)   Hunger Vital Sign    Worried About Running Out of Food in the Last Year: Never true    [place] in the Last Year: Never true  Transportation Needs: No Transportation Needs (07/12/2022)   PRAPARE - Hydrologist (Medical): No    Lack of Transportation (Non-Medical): No  Physical Activity: Insufficiently Active (05/08/2022)   Exercise Vital Sign    Days of Exercise per Week: 3 days     Minutes of Exercise per Session: 20 min  Stress: No Stress Concern Present (05/08/2022)   [place]    Feeling of Stress : Only a little  Social Connections: Socially Integrated (05/08/2022)   Social Connection and Isolation Panel [NHANES]    Frequency of Communication with Friends and Family: More than three times a week    Frequency of Social Gatherings with Friends and Family: Once a week    Attends Religious Services: More than 4 times per year    Active Member of [employer] or Organizations: Yes    Attends Music therapist: More than 4 times per year    Marital Status: Married  Human resources officer Violence: Not At Risk (05/08/2022)   Humiliation, Afraid, Rape, and Kick questionnaire    Fear of Current or Ex-Partner: No    Emotionally Abused: No    Physically Abused: No    Sexually Abused: No    FAMILY HISTORY: Family History  Problem Relation Age of Onset   Diabetes Mother    Heart failure Mother        around 10   CAD Father        11   Melanoma Brother 40   Healthy Daughter    Healthy Son    Colon cancer Neg Hx    Esophageal cancer Neg Hx    Rectal cancer Neg Hx    Stomach cancer Neg Hx     ALLERGIES:  is allergic to demerol, promethazine hcl, and ativan [lorazepam].  MEDICATIONS:  Current Outpatient Medications  Medication Sig Dispense Refill   acetaminophen (TYLENOL) 500 MG tablet Take 500 mg by mouth every 6 (six) hours as needed.     acyclovir (ZOVIRAX) 400 MG tablet Take 1 tablet (400 mg total) by mouth in the morning and at bedtime. 60 tablet 2   allopurinol (ZYLOPRIM) 300 MG tablet Take 1 tablet (300 mg total) by mouth daily. 60 tablet 2   aspirin EC 81 MG tablet Take 81 mg by mouth daily. Swallow whole.     atorvastatin (LIPITOR) 80 MG tablet Take 1 tablet (80 mg total) by mouth daily. Please call to schedule an overdue appointment with Dr. Marlou Porch for refills, [phone], thank you.  1st attempt. 30 tablet 0   Cholecalciferol (VITAMIN D3) 25 MCG (1000 UT) CAPS Take 1,000 Units by mouth daily.     dexamethasone (DECADRON) 4 MG tablet Take 5 tablets (20 mg total) by mouth once a week. 20 tablet 3  fexofenadine (ALLEGRA) 180 MG tablet      finasteride (PROSCAR) 5 MG tablet Take 5 mg by mouth daily.     fluticasone (FLONASE) 50 MCG/ACT nasal spray Place into the nose.     Ibuprofen 200 MG CAPS      lenalidomide (REVLIMID) 25 MG capsule Take 1 capsule (25 mg total) by mouth daily. Take for 21 days, none for 7 days. [employer] Auth # [id number] Date obtained 06/30/22 21 capsule 0   lisinopril (ZESTRIL) 10 MG tablet Take 1 tablet (10 mg total) by mouth daily. 90 tablet 3   loratadine (CLARITIN) 10 MG tablet Take 10 mg by mouth daily.     Magnesium Oxide 400 MG CAPS      melatonin 5 MG TABS      Omega-3 Fatty Acids (FISH OIL) 1200 MG CAPS Take 1,200 mg by mouth daily.     ondansetron (ZOFRAN) 8 MG tablet Take 1 tablet (8 mg total) by mouth every 8 (eight) hours as needed. 30 tablet 0   pantoprazole (PROTONIX) 40 MG tablet Take by mouth.     prochlorperazine (COMPAZINE) 10 MG tablet Take 1 tablet (10 mg total) by mouth every 6 (six) hours as needed for nausea or vomiting. 30 tablet 0   vitamin B-12 (CYANOCOBALAMIN) 1000 MCG tablet Take 1,000 mcg by mouth daily.     No current facility-administered medications for this visit.   Facility-Administered Medications Ordered in Other Visits  Medication Dose Route Frequency Provider Last Rate Last Admin   acetaminophen (TYLENOL) tablet 650 mg  650 mg Oral Once Orson Slick, MD       daratumumab-hyaluronidase-fihj (DARZALEX FASPRO) 1800-30000 MG-UT/15ML chemo SQ injection 1,800 mg  1,800 mg Subcutaneous Once Orson Slick, MD       diphenhydrAMINE (BENADRYL) capsule 50 mg  50 mg Oral Once Orson Slick, MD        REVIEW OF SYSTEMS:   Constitutional: ( - ) fevers, ( - )  chills , ( - ) night sweats Eyes: ( - ) blurriness of  vision, ( - ) double vision, ( - ) watery eyes Ears, nose, mouth, throat, and face: ( - ) mucositis, ( - ) sore throat Respiratory: ( - ) cough, ( - ) dyspnea, ( - ) wheezes Cardiovascular: ( - ) palpitation, ( - ) chest discomfort, ( - ) lower extremity swelling Gastrointestinal:  ( - ) nausea, ( - ) heartburn, ( - ) change in bowel habits Skin: ( - ) abnormal skin rashes Lymphatics: ( - ) new lymphadenopathy, ( - ) easy bruising Neurological: ( + ) numbness, ( - ) tingling, ( - ) new weaknesses Behavioral/Psych: ( - ) mood change, ( - ) new changes  All other systems were reviewed with the patient and are negative.  PHYSICAL EXAMINATION: ECOG PERFORMANCE STATUS: 1 - Symptomatic but completely ambulatory  Vitals:   07/17/22 1033  BP: (!) 143/82  Pulse: 90  Resp: 17  Temp: 97.6 F (36.4 C)  SpO2: 99%     Filed Weights   07/17/22 1033  Weight: 198 lb 1.6 oz (89.9 kg)      GENERAL: Well-appearing elderly Caucasian male, alert, no distress and comfortable SKIN: skin color, texture, turgor are normal, no rashes or significant lesions EYES: conjunctiva are pink and non-injected, sclera clear LUNGS: clear to auscultation and percussion with normal breathing effort HEART: regular rate & rhythm and no murmurs and no lower extremity edema ABDOMEN: covered wound  sites, no erythema or however purulent discharge from 2 of the lesions.  1 new lesion on this visit. Musculoskeletal: no cyanosis of digits and no clubbing  PSYCH: alert & oriented x 3, fluent speech NEURO: no focal motor/sensory deficits  LABORATORY DATA:  I have reviewed the data as listed    Latest Ref Rng & Units 07/17/2022   10:14 AM 06/19/2022   11:00 AM 05/22/2022   10:56 AM  CBC  WBC 4.0 - 10.5 K/uL 3.8  3.8  4.7   Hemoglobin 13.0 - 17.0 g/dL 14.0  13.5  14.0   Hematocrit 39.0 - 52.0 % 38.9  37.4  39.8   Platelets 150 - 400 K/uL 167  171  172        Latest Ref Rng & Units 07/17/2022   10:14 AM 06/19/2022    11:00 AM 05/22/2022   10:56 AM  CMP  Glucose 70 - 99 mg/dL 153  134  149   BUN 8 - 23 mg/dL _0 Creatinine 0.61 - 1.24 mg/dL 0.89  0.92  0.90   Sodium 135 - 145 mmol/L 140  138  136   Potassium 3.5 - 5.1 mmol/L 4.3  3.9  3.9   Chloride 98 - 111 mmol/L 105  106  104   CO2 22 - 32 mmol/L _1 Calcium 8.9 - 10.3 mg/dL 9.4  8.7  8.8   Total Protein 6.5 - 8.1 g/dL 6.3  5.9  6.5   Total Bilirubin 0.3 - 1.2 mg/dL 0.7  0.9  0.9   Alkaline Phos 38 - 126 U/L 73  75  92   AST 15 - 41 U/L _2 ALT 0 - 44 U/L _3 Lab Results  Component Value Date   MPROTEIN Not Observed 06/19/2022   MPROTEIN 0.2 (H) 05/22/2022   MPROTEIN 0.1 (H) 04/14/2022   Lab Results  Component Value Date   KPAFRELGTCHN 10.8 06/19/2022   KPAFRELGTCHN 9.7 05/22/2022   KPAFRELGTCHN 11.1 04/14/2022   LAMBDASER 6.7 06/19/2022   LAMBDASER 5.6 (L) 05/22/2022   LAMBDASER 8.1 04/14/2022   KAPLAMBRATIO 1.61 06/19/2022   KAPLAMBRATIO 1.73 (H) 05/22/2022   KAPLAMBRATIO 1.37 04/14/2022    RADIOGRAPHIC STUDIES: No results found.  ASSESSMENT & PLAN  Wesley [name] 81 y.o. male with medical history significant for IgG kappa multiple myeloma who presents for a follow up visit.   The patient meets diagnostic criteria by having greater than 60% plasma cells in his bone marrow.  At this time we are in agreement with [facility] in [place], Revlimid, and dexamethasone as the treatment of choice.   Previously we discussed the concept of comanaged care.  Comanaged care is when the patient has a local primary provider who administers local support and therapy while expert advice and treatment recommendations are rendered by a cancer specialist at a large academic center.  In this arrangement we provide local support, labs, treatment, and emergency visits, however the major decisions regarding the course of treatment are decided by a physician at an academic medical center.  The  patient voiced his understanding of comanaged care and was agreeable to proceeding forward with this care model.   The regimen of choice for this patient will be Dara, Revlimid, and dexamethasone.  We will start with weekly treatments for the first 2 cycles.  After that for an  additional 4 cycles he will be getting it every other week.  After cycle 6 the patient will then continue with monthly treatment.    # IgG Kappa Multiple Myeloma --Patient meets diagnostic criteria for multiple myeloma based on the presence of 60% plasma cells in the bone marrow --Pretreatment phenotype RBC as well as type and screen ordered  --Cycle 1 Day 1of Dara/Rev/Dex started on 09/30/2021. He started without revlimid which was added on 10/06/2021 (Cycle 1 Day 8)  -- Labs to include weekly CBC, CMP, LDH.  Additionally monthly SPEP and serum free light chains. Plan:  --Today is Cycle 11 Day 1 of Dara/Rev/Dex --Labs today were reviewed and adequate for treatment.  Labs show WBC 3.8, hemoglobin 14.0, MCV 108.1, and platelets of 167.  Additionally his creatinine is 0.89 with LFT WNL.  -- Return to clinic in 4 weeks prior to Cycle 12 Day 1 of Dara/Rev/Dex.   #Lethargy 2/2 Zometa:  --Discussed alternative which would be Xgeva q 28 days.  -- continue monthly Xgeva.    # Boils on Abdomen -- Patient underwent I&D and subsequent antibiotic treatment with no resolution.  2 lesions have developed at the site of his prior ostomy.  A new lesion was present on 06/19/2022. --Patient was seen by surgery who ordered CT scan on 01/31/2022 that showed no fluid collection in the anterior abdominal wall. Instructed patient on how to pack wounds.  --No systemic symptoms, okay to continue with systemic chemotherapy. --continue to follow with surgery.  #Macrocytosis #Leukopenia-stable # Thrombocytopenia-resolved --WBC 3.8, hemoglobin 14.0, MCV 108.1, and platelets of 167 --Levels fluctuating with treatment.  #Neuropathy:  --Stable and  does not interfere with grip or balance --Continue to monitor.    #Supportive Care -- chemotherapy education complete  -- port placement not required.  --ASA 81 mg p.o. daily for thromboprophylaxis on Revlimid. -- zofran 26m q8H PRN and compazine 163mPO q6H for nausea -- acyclovir 40019mO BID for VCZ prophylaxis -- allopurinol 300m56m daily for TLS prophylaxis -- zometa clearance obtained, started on 03/03/2022. Had lethargy with zometa, will be converting to Xgeva.   -- no pain medication required at this time.   No orders of the defined types were placed in this encounter.  All questions were answered. The patient knows to call the clinic with any problems, questions or concerns.  I have spent a total of 30 minutes minutes of face-to-face and non-face-to-face time, preparing to see the patient, performing a medically appropriate examination, counseling and educating the patient, ordering medications/tests, documenting clinical information in the electronic health record,  and care coordination.   JohnLedell Peoples Department of Hematology/Oncology [facility]: [phone]: [phone]: [email or web address]  07/17/2022 11:09 AM

## 2022-07-17 NOTE — Progress Notes (Signed)
Pt states he took decadron at home prior to arriving for injection today.

## 2022-07-17 NOTE — Patient Instructions (Signed)
[facility] ONCOLOGY  Discharge Instructions: Thank you for choosing [facility] to provide your oncology and hematology care.   If you have a lab appointment with the [facility], please go directly to the [facility] and check in at the registration area.   Wear comfortable clothing and clothing appropriate for easy access to any Portacath or PICC line.   We strive to give you quality time with your provider. You may need to reschedule your appointment if you arrive late (15 or more minutes).  Arriving late affects you and other patients whose appointments are after yours.  Also, if you miss three or more appointments without notifying the office, you may be dismissed from the clinic at the provider's discretion.      For prescription refill requests, have your pharmacy contact our office and allow 72 hours for refills to be completed.    Today you received the following chemotherapy and/or immunotherapy agents: Daratumumab Faspro.      To help prevent nausea and vomiting after your treatment, we encourage you to take your nausea medication as directed.  BELOW ARE SYMPTOMS THAT SHOULD BE REPORTED IMMEDIATELY: *FEVER GREATER THAN 100.4 F (38 C) OR HIGHER *CHILLS OR SWEATING *NAUSEA AND VOMITING THAT IS NOT CONTROLLED WITH YOUR NAUSEA MEDICATION *UNUSUAL SHORTNESS OF BREATH *UNUSUAL BRUISING OR BLEEDING *URINARY PROBLEMS (pain or burning when urinating, or frequent urination) *BOWEL PROBLEMS (unusual diarrhea, constipation, pain near the anus) TENDERNESS IN MOUTH AND THROAT WITH OR WITHOUT PRESENCE OF ULCERS (sore throat, sores in mouth, or a toothache) UNUSUAL RASH, SWELLING OR PAIN  UNUSUAL VAGINAL DISCHARGE OR ITCHING   Items with * indicate a potential emergency and should be followed up as soon as possible or go to the Emergency Department if any problems should occur.  Please show the CHEMOTHERAPY ALERT CARD or IMMUNOTHERAPY ALERT CARD at  check-in to the Emergency Department and triage nurse.  Should you have questions after your visit or need to cancel or reschedule your appointment, please contact [facility]  Dept: [phone]  and follow the prompts.  Office hours are 8:00 a.m. to 4:30 p.m. Monday - Friday. Please note that voicemails left after 4:00 p.m. may not be returned until the following business day.  We are closed weekends and major holidays. You have access to a nurse at all times for urgent questions. Please call the main number to the clinic Dept: [phone] and follow the prompts.   For any non-urgent questions, you may also contact your provider using MyChart. We now offer e-Visits for anyone 43 and older to request care online for non-urgent symptoms. For details visit [email or web address].   Also download the MyChart app! Go to the app store, search "MyChart", open the app, select [facility], and log in with your MyChart username and password.  Masks are optional in the cancer centers. If you would like for your care team to wear a mask while they are taking care of you, please let them know. You may have one support person who is at least 81 years old accompany you for your appointments.

## 2022-07-18 LAB — KAPPA/LAMBDA LIGHT CHAINS
Kappa free light chain: 10 mg/L (ref 3.3–19.4)
Kappa, lambda light chain ratio: 1.64 (ref 0.26–1.65)
Lambda free light chains: 6.1 mg/L (ref 5.7–26.3)

## 2022-07-20 LAB — IFE, DARA-SPECIFIC, SERUM
IgA: 81 mg/dL (ref 61–437)
IgG (Immunoglobin G), Serum: 401 mg/dL — ABNORMAL LOW (ref 603–1613)
IgM (Immunoglobulin M), Srm: 19 mg/dL (ref 15–143)

## 2022-07-25 ENCOUNTER — Other Ambulatory Visit: Payer: Self-pay | Admitting: Cardiology

## 2022-07-26 ENCOUNTER — Other Ambulatory Visit: Payer: Self-pay | Admitting: *Deleted

## 2022-07-26 ENCOUNTER — Telehealth: Payer: Self-pay | Admitting: *Deleted

## 2022-07-26 DIAGNOSIS — C9000 Multiple myeloma not having achieved remission: Secondary | ICD-10-CM

## 2022-07-26 MED ORDER — LENALIDOMIDE 10 MG PO CAPS: 10.0000 mg | ORAL_CAPSULE | Freq: Every day | ORAL | 0 refills | Status: DC

## 2022-07-26 NOTE — Telephone Encounter (Signed)
TCT patient regarding his Revlimid. Spoke with him. Advised that Dr. Lorenso Courier is decreasing his Revlimid dose from 25 mg to maintenance of 10 mg daily foir 21 days and then none for 7 days. This cycle will repeat every 28 days as before. Py voiced understanding.  Prescription for Revlimid 10 mg sent in to specialty pharmacy today.

## 2022-07-27 ENCOUNTER — Encounter: Payer: Self-pay | Admitting: *Deleted

## 2022-08-03 ENCOUNTER — Ambulatory Visit (INDEPENDENT_AMBULATORY_CARE_PROVIDER_SITE_OTHER): Payer: Medicare Other | Admitting: Podiatry

## 2022-08-03 DIAGNOSIS — L84 Corns and callosities: Secondary | ICD-10-CM | POA: Diagnosis not present

## 2022-08-03 DIAGNOSIS — T451X5A Adverse effect of antineoplastic and immunosuppressive drugs, initial encounter: Secondary | ICD-10-CM

## 2022-08-03 DIAGNOSIS — B351 Tinea unguium: Secondary | ICD-10-CM

## 2022-08-03 DIAGNOSIS — M79675 Pain in left toe(s): Secondary | ICD-10-CM

## 2022-08-03 DIAGNOSIS — G620 Drug-induced polyneuropathy: Secondary | ICD-10-CM

## 2022-08-03 DIAGNOSIS — M79674 Pain in right toe(s): Secondary | ICD-10-CM

## 2022-08-04 ENCOUNTER — Ambulatory Visit: Payer: Medicare Other | Admitting: Hematology and Oncology

## 2022-08-04 ENCOUNTER — Ambulatory Visit: Payer: Medicare Other

## 2022-08-04 ENCOUNTER — Other Ambulatory Visit: Payer: Medicare Other

## 2022-08-08 NOTE — Progress Notes (Signed)
  Subjective:  Patient ID: Wesley [name], male    DOB: 1940/10/20,  MRN: [id number]  Chief Complaint  Patient presents with   Nail Problem    Thick painful toenails, 3 month follow up    81 y.o. male presents with the above complaint. History confirmed with patient. He presents today for painful thickened elongated toenails that he cannot cut.  He also has multiple calluses that are causing pressure and pain.  His wife is a patient of mine and referred him to me.  He is currently undergoing chemotherapy and has noticed burning tingling pain as well as numbness in the feet.  Doing okay no new issues the calluses are thickened and painful and the nails are thick and painful again.  The calluses are thickening as well  Objective:  Physical Exam: warm, good capillary refill, no trophic changes or ulcerative lesions, normal DP and PT pulses, normal sensory exam, and diffuse peripheral neuropathy with loss of protective sensation to [employer] monofilament. Left Foot: dystrophic yellowed discolored nail plates with subungual debris and hyperkeratotic lesion distal tip of third toe Right Foot: dystrophic yellowed discolored nail plates with subungual debris and hyperkeratotic lesion distal tip of great toe  Assessment:   1. Pain due to onychomycosis of toenails of both feet   2. Callus of foot   3. Chemotherapy-induced peripheral neuropathy ([place])      Plan:  Patient was evaluated and treated and all questions answered.  Discussed the etiology and treatment options for the condition in detail with the patient. Educated patient on the topical and oral treatment options for mycotic nails. Recommended debridement of the nails today. Sharp and mechanical debridement performed of all painful and mycotic nails today. Nails debrided in length and thickness using a nail nipper to level of comfort. Discussed treatment options including appropriate shoe gear. Follow up as needed for painful  nails.  All symptomatic hyperkeratoses were safely debrided with a sterile #15 blade to patient's level of comfort without incident. We discussed preventative and palliative care of these lesions including supportive and accommodative shoegear, padding, prefabricated and custom molded accommodative orthoses, use of a pumice stone and lotions/creams daily.    Return in about 3 months (around 11/02/2022) for RFC.

## 2022-08-11 ENCOUNTER — Other Ambulatory Visit: Payer: Self-pay | Admitting: Family Medicine

## 2022-08-15 ENCOUNTER — Ambulatory Visit: Payer: Medicare Other | Admitting: Physician Assistant

## 2022-08-15 ENCOUNTER — Telehealth: Payer: Self-pay | Admitting: Hematology and Oncology

## 2022-08-15 ENCOUNTER — Other Ambulatory Visit: Payer: Medicare Other

## 2022-08-15 ENCOUNTER — Ambulatory Visit: Payer: Medicare Other

## 2022-08-15 NOTE — Telephone Encounter (Signed)
Called patient to r/s appointment for 1/2. Patient not feeling well. R/s patient to 1/4. Patient notified.

## 2022-08-16 ENCOUNTER — Other Ambulatory Visit: Payer: Self-pay | Admitting: *Deleted

## 2022-08-16 MED ORDER — LENALIDOMIDE 10 MG PO CAPS: 10.0000 mg | ORAL_CAPSULE | Freq: Every day | ORAL | 0 refills | Status: DC

## 2022-08-17 ENCOUNTER — Inpatient Hospital Stay (HOSPITAL_BASED_OUTPATIENT_CLINIC_OR_DEPARTMENT_OTHER): Payer: Medicare Other | Admitting: Hematology and Oncology

## 2022-08-17 ENCOUNTER — Inpatient Hospital Stay: Payer: Medicare Other | Attending: Hematology and Oncology

## 2022-08-17 DIAGNOSIS — Z79899 Other long term (current) drug therapy: Secondary | ICD-10-CM | POA: Insufficient documentation

## 2022-08-17 DIAGNOSIS — C9000 Multiple myeloma not having achieved remission: Secondary | ICD-10-CM | POA: Insufficient documentation

## 2022-08-17 DIAGNOSIS — D649 Anemia, unspecified: Secondary | ICD-10-CM | POA: Diagnosis not present

## 2022-08-17 LAB — CBC WITH DIFFERENTIAL (CANCER CENTER ONLY)
Abs Immature Granulocytes: 0.02 10*3/uL (ref 0.00–0.07)
Basophils Absolute: 0.1 10*3/uL (ref 0.0–0.1)
Basophils Relative: 2 %
Eosinophils Absolute: 0.2 10*3/uL (ref 0.0–0.5)
Eosinophils Relative: 4 %
HCT: 39.5 % (ref 39.0–52.0)
Hemoglobin: 14 g/dL (ref 13.0–17.0)
Immature Granulocytes: 1 %
Lymphocytes Relative: 26 %
Lymphs Abs: 1 10*3/uL (ref 0.7–4.0)
MCH: 38.6 pg — ABNORMAL HIGH (ref 26.0–34.0)
MCHC: 35.4 g/dL (ref 30.0–36.0)
MCV: 108.8 fL — ABNORMAL HIGH (ref 80.0–100.0)
Monocytes Absolute: 0.4 10*3/uL (ref 0.1–1.0)
Monocytes Relative: 9 %
Neutro Abs: 2.3 10*3/uL (ref 1.7–7.7)
Neutrophils Relative %: 58 %
Platelet Count: 155 10*3/uL (ref 150–400)
RBC: 3.63 MIL/uL — ABNORMAL LOW (ref 4.22–5.81)
RDW: 15.1 % (ref 11.5–15.5)
WBC Count: 3.9 10*3/uL — ABNORMAL LOW (ref 4.0–10.5)
nRBC: 0 % (ref 0.0–0.2)

## 2022-08-17 LAB — CMP (CANCER CENTER ONLY)
ALT: 31 U/L (ref 0–44)
AST: 24 U/L (ref 15–41)
Albumin: 4.1 g/dL (ref 3.5–5.0)
Alkaline Phosphatase: 87 U/L (ref 38–126)
Anion gap: 8 (ref 5–15)
BUN: 13 mg/dL (ref 8–23)
CO2: 29 mmol/L (ref 22–32)
Calcium: 9.4 mg/dL (ref 8.9–10.3)
Chloride: 104 mmol/L (ref 98–111)
Creatinine: 0.98 mg/dL (ref 0.61–1.24)
GFR, Estimated: >60 mL/min (ref >60)
Glucose, Bld: 115 mg/dL — ABNORMAL HIGH (ref 70–99)
Potassium: 3.8 mmol/L (ref 3.5–5.1)
Sodium: 141 mmol/L (ref 135–145)
Total Bilirubin: 0.9 mg/dL (ref 0.3–1.2)
Total Protein: 5.8 g/dL — ABNORMAL LOW (ref 6.5–8.1)

## 2022-08-17 LAB — LACTATE DEHYDROGENASE: LDH: 141 U/L (ref 98–192)

## 2022-08-17 NOTE — Progress Notes (Signed)
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 Further workup showed SPEP M-Spike 4.2 g/dL with IgG-Kappa as monoclonal protein, IgG 5908 mg/dL 09/08/2021: Initial evaluation at [facility]. bone marrow biopsy performed, showed 60% abnormal plasma cells in 60% cellular marrow. 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  09/26/2021: transfer care to Dr. Lorenso Courier at [facility]  09/30/2021: Cycle 1 Day 1 of Dara/Dex 10/06/2021: Cycle 1 Day 8 (added revlimid) 10/28/2021: Cycle 2 Day 1 of Dara/Rev/Dex 11/25/2021: Cycle 3 Day 1 of Dara/Rev/Dex 5/12/023: Cycle 4 Day 1 of Dara/Rev/Dex 01/20/2022: Cycle 5 Day 1 of Dara/Rev/Dex 02/17/2022: Cycle 6 Day 1 of Dara/Rev/Dex 03/17/2022: Cycle 7 Day 1 of Dara/Rev/Dex 04/14/2022: Cycle 8 Day 1 of Dara/Rev/Dex 05/22/2022: Cycle 9 Day 1 of Dara/Rev/Dex 06/19/2022: Cycle 10 Day 1 of Dara/Rev/Dex 07/17/2022: Cycle 11 Day 1 of Dara/Rev/Dex 08/17/2022: start maintenance revlimid 10 mg PO daily 21 of 28 days.   Interval History:  Wesley [name] 82 y.o. male with medical history significant for IgG kappa multiple myeloma who presents for a follow up visit. The patient's last visit was on 07/17/2022. In the interim since the last visit he continued treatment with maintenance revlimid.   On exam today Wesley [name] reports he is tolerating treatment well without any difficulty.  He notes that he plans on undergoing a diet this year  by drinking less wine.  He currently drinks 3 glasses per night and would like to drop down to 1 glass per night.  He notes he has been eating well and his appetite is strong.  He is delighted that he may discontinue his acyclovir, dexamethasone, and allopurinol.  He reports his target weight is 175 pounds.  Overall he feels quite well and is glad to be on maintenance therapy.   He is not having any nausea, vomiting, or diarrhea.  He denies fevers, chills, night sweats, shortness of breath, chest pain or cough. He has no other complaints. Full 10 point ROS is listed below.  Overall he is willing and able to proceed with continued treatment at this time.  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)    CAD (coronary artery disease)    Cholelithiasis    Chronic allergic rhinitis    Coronary atherosclerosis of native coronary artery    Fatty liver    GERD (gastroesophageal reflux disease)    HX 39yr ago, no longer a problem, no meds   High blood pressure    Hypercholesterolemia    Macrocytosis without anemia    Multiple [place] achieved [place] ([place] 09/26/2021   OSA on CPAP    Paraesophageal hernia    Partial bowel obstruction ([place]    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 PT denies significant findings   CARDIAC CATHETERIZATION  2008   COLOSTOMY N/A 10/31/2019   Procedure: COLOSTOMY;  Surgeon: Erroll Luna, MD;  Location: [facility];  Service: General;  Laterality: N/A;   CYSTOSCOPY W/ URETERAL STENT PLACEMENT Bilateral 10/31/2019   Procedure: CYSTOSCOPY  URETERAL STENT PLACEMENT BILATERAL;  Surgeon: Franchot Gallo, MD;  Location: [facility];  Service: Urology;  Laterality: Bilateral;   CYSTOSCOPY WITH STENT PLACEMENT Bilateral 03/11/2020   Procedure: CYSTOSCOPY WITH BILATERAL FIREFLY INJECTION;  Surgeon: Irine Seal, MD;  [facility];  Service: Urology;  Laterality: Bilateral;   EYE SURGERY     BILATERAL CATARACT SURGERY WITH LENS IMPLANTS   FLEXIBLE SIGMOIDOSCOPY N/A 10/29/2019   Procedure: FLEXIBLE SIGMOIDOSCOPY;  Surgeon: Jerene Bears, MD;  [facility];  Service: Gastroenterology;  Laterality: N/A;   INCISIONAL HERNIA REPAIR N/A 10/13/2020   Procedure: OPEN INCISIONAL HERNIA REPAIR WITH MESH;  Surgeon: [name], MD;  Location: [facility];  Service: General;  Laterality: N/A;   INSERTION OF MESH N/A 09/08/2016   Procedure: INSERTION OF MESH;  Surgeon: Jackolyn Confer, MD;  [facility];  Service: General;  Laterality: N/A;   IR RADIOLOGIST EVAL & MGMT  11/10/2020   IR RADIOLOGIST EVAL & MGMT  11/25/2020   LAPAROSCOPIC SIGMOID COLECTOMY N/A 10/31/2019   Procedure: DIAGNOSTIC LAPAROSCOPY; EXPLORATORY LAPAROTOMY; SIGMOID COLECTOMY;  Surgeon: Erroll Luna, MD;  [facility];  Service: General;  Laterality: N/A;   right rotator cuff     SUBMUCOSAL TATTOO INJECTION  10/29/2019   Procedure: SUBMUCOSAL TATTOO INJECTION;  Surgeon: Jerene Bears, MD;  [facility];  Service: Gastroenterology;;   TONSILLECTOMY     UMBILICAL HERNIA REPAIR N/A 09/08/2016   Procedure: OPEN UMBILICAL HERNIA REPAIR WITH MESH;  Surgeon: Jackolyn Confer, MD;  [facility];  Service: General;  Laterality: N/A;   VENTRAL HERNIA REPAIR N/A 02/03/2021   Procedure: LAPAROSCOPIC VENTRAL HERNIA REPAIR WITH MESH;  Surgeon: [name], MD;  Location: [facility];  Service: General;  Laterality: N/A;   XI ROBOTIC ASSISTED COLOSTOMY TAKEDOWN N/A 03/11/2020   Procedure: ROBOTIC ASSISTED COLOSTOMY REVERSAL, RIGID PROCTOSCOPY;  Surgeon: Leighton Ruff, MD;   [facility];  Service: General;  Laterality: N/A;    SOCIAL HISTORY: Social History   Socioeconomic History   Marital status: Married    Spouse name: Wesley [name]    Number of children: 2   Years of education: 12   Highest education level: Master's degree (e.g., MA, MS, MEng, MEd, MSW, MBA)  Occupational History   Occupation: Retired   Tobacco Use   Smoking status: Never   Smokeless tobacco: Never  Vaping Use   Vaping Use: Never used  Substance and Sexual Activity   Alcohol use: Yes    Alcohol/week: 17.0 standard drinks of alcohol    Types: 3 Glasses of wine, 14 Standard drinks or equivalent per week   Drug use: No   Sexual activity: Not Currently  Other Topics Concern   Not on file  Social History Narrative   Married. 2 children 52 in 18 in 2022- both went to [employer]. 5 grandkids- all girls from oldest 24 looking premed [employer], 5 soph [place], 73 64 year olds, 45 year olds.       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 [employer]: fishing, [place] football and basketball, [place], yardwork   Social Determinants of Health   Financial Resource Strain: [place]  (05/08/2022)   Overall Financial Resource Strain (CARDIA)    Difficulty of Paying Living Expenses: Not hard at all  Food Insecurity: No Food Insecurity (07/12/2022)   Hunger Vital Sign    Worried About Running Out of Food in the Last Year: Never true    [place] in the Last Year: Never true  Transportation Needs: No Transportation Needs (07/12/2022)   PRAPARE - Hydrologist (Medical): No    Lack of Transportation (Non-Medical): No  Physical Activity: Insufficiently Active (05/08/2022)   Exercise Vital Sign    Days of Exercise per Week: 3 days    Minutes of Exercise per Session: 20 min  Stress: No Stress Concern Present (05/08/2022)   [place]    Feeling of Stress : Only a little  Social Connections: Socially Integrated (05/08/2022)   Social Connection and Isolation Panel [NHANES]    Frequency of Communication with Friends and Family: More than three times a week    Frequency of Social Gatherings with Friends and Family: Once a week    Attends Religious Services: More than 4 times per year    Active Member of [employer] or Organizations: Yes    Attends Music therapist: More than 4 times per year    Marital Status: Married  Human resources officer Violence: Not At Risk (05/08/2022)   Humiliation, Afraid, Rape, and Kick questionnaire    Fear of Current or Ex-Partner: No    Emotionally Abused: No    Physically Abused: No    Sexually Abused: No    FAMILY HISTORY: Family History  Problem Relation Age of Onset   Diabetes Mother    Heart failure Mother        around 28   CAD Father        48   Melanoma Brother 56   Healthy Daughter    Healthy Son    Colon cancer Neg Hx    Esophageal cancer Neg Hx    Rectal cancer Neg Hx    Stomach cancer Neg Hx     ALLERGIES:  is allergic to demerol, promethazine hcl, and ativan [lorazepam].  MEDICATIONS:  Current Outpatient Medications  Medication Sig Dispense Refill   acetaminophen (TYLENOL) 500 MG tablet Take 500 mg by mouth every 6 (six) hours as needed.     acyclovir (ZOVIRAX) 400 MG tablet Take 1 tablet (400 mg total) by mouth in the morning and at bedtime. 60 tablet 2   allopurinol (ZYLOPRIM) 300 MG tablet Take 1 tablet (300 mg total) by mouth daily. 60 tablet 2   aspirin EC 81 MG tablet Take 81 mg by mouth daily. Swallow whole.     atorvastatin (LIPITOR) 80 MG tablet Take 1 tablet (80 mg total) by mouth daily. 90 tablet 3   Cholecalciferol (VITAMIN D3) 25 MCG (1000 UT) CAPS Take 1,000 Units by mouth daily.     dexamethasone (DECADRON) 4 MG tablet Take 5 tablets (20 mg total) by mouth once a week. 20 tablet 3    fexofenadine (ALLEGRA) 180 MG tablet      finasteride (PROSCAR) 5 MG tablet Take 5 mg by mouth daily.     fluticasone (FLONASE) 50 MCG/ACT nasal spray  Place into the nose.     Ibuprofen 200 MG CAPS      lenalidomide (REVLIMID) 10 MG capsule Take 1 capsule (10 mg total) by mouth daily. Fanny Dance #[id number]    Date Obtained 08/16/22 Take 1 capsule daily for 21 days and then none for 7 days. 21 capsule 0   lisinopril (ZESTRIL) 10 MG tablet TAKE 1 TABLET EVERY DAY 90 tablet 3   loratadine (CLARITIN) 10 MG tablet Take 10 mg by mouth daily.     Magnesium Oxide 400 MG CAPS      melatonin 5 MG TABS      Omega-3 Fatty Acids (FISH OIL) 1200 MG CAPS Take 1,200 mg by mouth daily.     ondansetron (ZOFRAN) 8 MG tablet Take 1 tablet (8 mg total) by mouth every 8 (eight) hours as needed. 30 tablet 0   pantoprazole (PROTONIX) 40 MG tablet Take by mouth.     prochlorperazine (COMPAZINE) 10 MG tablet Take 1 tablet (10 mg total) by mouth every 6 (six) hours as needed for nausea or vomiting. 30 tablet 0   vitamin B-12 (CYANOCOBALAMIN) 1000 MCG tablet Take 1,000 mcg by mouth daily.     No current facility-administered medications for this visit.    REVIEW OF SYSTEMS:   Constitutional: ( - ) fevers, ( - )  chills , ( - ) night sweats Eyes: ( - ) blurriness of vision, ( - ) double vision, ( - ) watery eyes Ears, nose, mouth, throat, and face: ( - ) mucositis, ( - ) sore throat Respiratory: ( - ) cough, ( - ) dyspnea, ( - ) wheezes Cardiovascular: ( - ) palpitation, ( - ) chest discomfort, ( - ) lower extremity swelling Gastrointestinal:  ( - ) nausea, ( - ) heartburn, ( - ) change in bowel habits Skin: ( - ) abnormal skin rashes Lymphatics: ( - ) new lymphadenopathy, ( - ) easy bruising Neurological: ( + ) numbness, ( - ) tingling, ( - ) new weaknesses Behavioral/Psych: ( - ) mood change, ( - ) new changes  All other systems were reviewed with the patient and are negative.  PHYSICAL EXAMINATION: ECOG  PERFORMANCE STATUS: 1 - Symptomatic but completely ambulatory  Vitals:   08/17/22 1146  BP: 132/83  Pulse: 89  Resp: 17  Temp: 97.9 F (36.6 C)  SpO2: 99%      Filed Weights   08/17/22 1146  Weight: 197 lb 14.4 oz (89.8 kg)       GENERAL: Well-appearing elderly Caucasian male, alert, no distress and comfortable SKIN: skin color, texture, turgor are normal, no rashes or significant lesions EYES: conjunctiva are pink and non-injected, sclera clear LUNGS: clear to auscultation and percussion with normal breathing effort HEART: regular rate & rhythm and no murmurs and no lower extremity edema ABDOMEN: covered wound sites, no erythema or however purulent discharge from 2 of the lesions.  1 new lesion on this visit. Musculoskeletal: no cyanosis of digits and no clubbing  PSYCH: alert & oriented x 3, fluent speech NEURO: no focal motor/sensory deficits  LABORATORY DATA:  I have reviewed the data as listed    Latest Ref Rng & Units 08/17/2022   11:15 AM 07/17/2022   10:14 AM 06/19/2022   11:00 AM  CBC  WBC 4.0 - 10.5 K/uL 3.9  3.8  3.8   Hemoglobin 13.0 - 17.0 g/dL 14.0  14.0  13.5   Hematocrit 39.0 - 52.0 % 39.5  38.9  37.4  Platelets 150 - 400 K/uL 155  167  171        Latest Ref Rng & Units 08/17/2022   11:15 AM 07/17/2022   10:14 AM 06/19/2022   11:00 AM  CMP  Glucose 70 - 99 mg/dL 115  153  134   BUN 8 - 23 mg/dL _0 Creatinine 0.61 - 1.24 mg/dL 0.98  0.89  0.92   Sodium 135 - 145 mmol/L 141  140  138   Potassium 3.5 - 5.1 mmol/L 3.8  4.3  3.9   Chloride 98 - 111 mmol/L 104  105  106   CO2 22 - 32 mmol/L _1 Calcium 8.9 - 10.3 mg/dL 9.4  9.4  8.7   Total Protein 6.5 - 8.1 g/dL 5.8  6.3  5.9   Total Bilirubin 0.3 - 1.2 mg/dL 0.9  0.7  0.9   Alkaline Phos 38 - 126 U/L 87  73  75   AST 15 - 41 U/L _2 ALT 0 - 44 U/L _3 Lab Results  Component Value Date   MPROTEIN Not Observed 06/19/2022   MPROTEIN 0.2 (H) 05/22/2022    MPROTEIN 0.1 (H) 04/14/2022   Lab Results  Component Value Date   KPAFRELGTCHN 10.0 07/17/2022   KPAFRELGTCHN 10.8 06/19/2022   KPAFRELGTCHN 9.7 05/22/2022   LAMBDASER 6.1 07/17/2022   LAMBDASER 6.7 06/19/2022   LAMBDASER 5.6 (L) 05/22/2022   KAPLAMBRATIO 1.64 07/17/2022   KAPLAMBRATIO 1.61 06/19/2022   KAPLAMBRATIO 1.73 (H) 05/22/2022    RADIOGRAPHIC STUDIES: No results found.  ASSESSMENT & PLAN  THIERNO HUN 82 y.o. male with medical history significant for IgG kappa multiple myeloma who presents for a follow up visit.   The patient meets diagnostic criteria by having greater than 60% plasma cells in his bone marrow.  At this time we are in agreement with [facility], Revlimid, and dexamethasone as the treatment of choice.   Previously we discussed the concept of comanaged care.  Comanaged care is when the patient has a local primary provider who administers local support and therapy while expert advice and treatment recommendations are rendered by a cancer specialist at a large academic center.  In this arrangement we provide local support, labs, treatment, and emergency visits, however the major decisions regarding the course of treatment are decided by a physician at an academic medical center.  The patient voiced his understanding of comanaged care and was agreeable to proceeding forward with this care model.   The regimen of choice for this patient will be Dara, Revlimid, and dexamethasone.  We will start with weekly treatments for the first 2 cycles.  After that for an additional 4 cycles he will be getting it every other week.  After cycle 6 the patient will then continue with monthly treatment.    # IgG Kappa Multiple Myeloma --Patient meets diagnostic criteria for multiple myeloma based on the presence of 60% plasma cells in the bone marrow --Pretreatment phenotype RBC as well as type and screen ordered  --Cycle 1 Day 1of Dara/Rev/Dex  started on 09/30/2021. He started without revlimid which was added on 10/06/2021 (Cycle 1 Day 8)  -- Labs to include weekly CBC, CMP, LDH.  Additionally monthly SPEP and serum free light chains. Plan:  --patient is currently on maintenance revlimid 10 mg PO daily 21 of  28 days.  --Labs today were reviewed and adequate for treatment.  Labs show WBC 3.9, hemoglobin 14.0, MCV 108.8, and platelets of 155.  Additionally his creatinine and LFT are WNL.  -- Return to clinic in 4 weeks continued monitoring on maintenance revlimid.   #Lethargy 2/2 Zometa:  --Discussed alternative which would be Xgeva q 28 days.  -- continue monthly Xgeva.    # Boils on Abdomen -- Patient underwent I&D and subsequent antibiotic treatment with no resolution.  2 lesions have developed at the site of his prior ostomy.  A new lesion was present on 06/19/2022. --Patient was seen by surgery who ordered CT scan on 01/31/2022 that showed no fluid collection in the anterior abdominal wall. Instructed patient on how to pack wounds.  --No systemic symptoms, okay to continue with systemic chemotherapy. --continue to follow with surgery.   #Macrocytosis #Leukopenia-stable # Thrombocytopenia-resolved --WBC 3.9, hemoglobin 14.0, MCV 108.8, and platelets of 155 --Levels fluctuating with treatment.  #Neuropathy:  --Stable and does not interfere with grip or balance --Continue to monitor.    #Supportive Care -- chemotherapy education complete  -- port placement not required.  --ASA 81 mg p.o. daily for thromboprophylaxis on Revlimid. -- zofran 24m q8H PRN and compazine 147mPO q6H for nausea -- acyclovir 40067mO BID for VCZ prophylaxis -- allopurinol 300m47m daily for TLS prophylaxis -- zometa clearance obtained, started on 03/03/2022. Had lethargy with zometa, will be converting to Xgeva.   -- no pain medication required at this time.   No orders of the defined types were placed in this encounter.  All questions were  answered. The patient knows to call the clinic with any problems, questions or concerns.  I have spent a total of 30 minutes minutes of face-to-face and non-face-to-face time, preparing to see the patient, performing a medically appropriate examination, counseling and educating the patient, ordering medications/tests, documenting clinical information in the electronic health record,  and care coordination.   JohnLedell Peoples Department of Hematology/Oncology [facility]: [phone]: [phone]: [email or web address]  08/17/2022 11:53 AM

## 2022-08-18 ENCOUNTER — Telehealth: Payer: Self-pay | Admitting: Pharmacy Technician

## 2022-08-18 LAB — KAPPA/LAMBDA LIGHT CHAINS
Kappa free light chain: 11.1 mg/L (ref 3.3–19.4)
Kappa, lambda light chain ratio: 1.76 — ABNORMAL HIGH (ref 0.26–1.65)
Lambda free light chains: 6.3 mg/L (ref 5.7–26.3)

## 2022-08-18 NOTE — Telephone Encounter (Signed)
Oral Oncology Patient Advocate Encounter   Received notification that patient is due for re-enrollment for assistance for Revlimid through [employer].   Re-enrollment application submitted via e-fax [phone]  [facility] phone number [phone].   I will continue to follow until final determination.  Lady Deutscher, CPhT-Adv Oncology Pharmacy Patient [place] Direct Number: [phone]  Fax: [phone]

## 2022-08-22 LAB — MULTIPLE MYELOMA PANEL, SERUM
Albumin SerPl Elph-Mcnc: 3.4 g/dL (ref 2.9–4.4)
Albumin/Glob SerPl: 1.6 (ref 0.7–1.7)
Alpha 1: 0.2 g/dL (ref 0.0–0.4)
Alpha2 Glob SerPl Elph-Mcnc: 0.7 g/dL (ref 0.4–1.0)
B-Globulin SerPl Elph-Mcnc: 0.9 g/dL (ref 0.7–1.3)
Gamma Glob SerPl Elph-Mcnc: 0.4 g/dL (ref 0.4–1.8)
Globulin, Total: 2.2 g/dL (ref 2.2–3.9)
IgA: 87 mg/dL (ref 61–437)
IgG (Immunoglobin G), Serum: 365 mg/dL — ABNORMAL LOW (ref 603–1613)
IgM (Immunoglobulin M), Srm: 31 mg/dL (ref 15–143)
Total Protein ELP: 5.6 g/dL — ABNORMAL LOW (ref 6.0–8.5)

## 2022-08-22 LAB — IFE, DARA-SPECIFIC, SERUM
IgA: 86 mg/dL (ref 61–437)
IgG (Immunoglobin G), Serum: 371 mg/dL — ABNORMAL LOW (ref 603–1613)
IgM (Immunoglobulin M), Srm: 23 mg/dL (ref 15–143)

## 2022-08-24 ENCOUNTER — Other Ambulatory Visit (HOSPITAL_COMMUNITY): Payer: Self-pay

## 2022-08-24 ENCOUNTER — Telehealth: Payer: Self-pay | Admitting: Pharmacy Technician

## 2022-08-24 ENCOUNTER — Other Ambulatory Visit: Payer: Self-pay

## 2022-08-24 MED ORDER — LENALIDOMIDE 10 MG PO CAPS: 10.0000 mg | ORAL_CAPSULE | Freq: Every day | ORAL | 0 refills | Status: DC

## 2022-08-24 NOTE — Telephone Encounter (Signed)
Oral Oncology Patient Advocate Encounter  Patient is eligible for funding form [employer]. This funding must be used before patient can re-enroll in patient assistance.   I have spoken with the patient.  This case will be re-opened at a later date if needed.  Lady Deutscher, CPhT-Adv Oncology Pharmacy Patient [place] Direct Number: [phone]  Fax: [phone]

## 2022-08-24 NOTE — Telephone Encounter (Signed)
Oral Oncology Patient Advocate Encounter   Received notification that prior authorization for Lenalidomide is required.   PA submitted on 08/24/22 Key [id number] Status is pending     Lady Deutscher, CPhT-Adv Oncology Pharmacy Patient [place] Direct Number: [phone]  Fax: [phone]

## 2022-08-24 NOTE — Telephone Encounter (Signed)
Oral Oncology Patient Advocate Encounter  Prior Authorization for Lenalidomide has been approved.    PA# [id number] Effective dates: 08/24/22 through 08/14/23  Patients co-pay is $4,181.91.    Lady Deutscher, CPhT-Adv Oncology Pharmacy Patient [place] Direct Number: [phone]  Fax: [phone]

## 2022-08-24 NOTE — Telephone Encounter (Addendum)
Oral Oncology Patient Advocate Encounter  Was successful in securing patient a $12,000 grant from [facility] to provide copayment coverage for Lenalidomide.  This will keep the out of pocket expense at $0.     [employer] ID: [id number]  I have spoken with the patient.   The billing information is as follows and has been shared with [place].    RxBin: [id number] PCN: [id number] Member ID: [id number] Group ID: [id number] Dates of Eligibility: 07/25/22 through 07/25/23  Fund:  [place], CPhT-Adv Oncology Pharmacy Patient [place] Direct Number: [phone]  Fax: [phone]

## 2022-08-28 ENCOUNTER — Other Ambulatory Visit (HOSPITAL_COMMUNITY): Payer: Self-pay

## 2022-08-29 ENCOUNTER — Encounter: Payer: Self-pay | Admitting: Hematology and Oncology

## 2022-08-29 ENCOUNTER — Other Ambulatory Visit: Payer: Self-pay

## 2022-08-29 MED ORDER — LENALIDOMIDE 10 MG PO CAPS: 10.0000 mg | ORAL_CAPSULE | Freq: Every day | ORAL | 0 refills | Status: DC

## 2022-08-29 NOTE — Telephone Encounter (Signed)
Per pharmacy, Revlimid refill resent from [facility] to [place].

## 2022-08-31 ENCOUNTER — Encounter: Payer: Self-pay | Admitting: Hematology and Oncology

## 2022-09-01 ENCOUNTER — Ambulatory Visit: Payer: Medicare Other

## 2022-09-01 ENCOUNTER — Other Ambulatory Visit: Payer: Medicare Other

## 2022-09-01 ENCOUNTER — Ambulatory Visit: Payer: Medicare Other | Admitting: Hematology and Oncology

## 2022-09-04 DIAGNOSIS — L7682 Other postprocedural complications of skin and subcutaneous tissue: Secondary | ICD-10-CM | POA: Diagnosis not present

## 2022-09-04 DIAGNOSIS — I700 Atherosclerosis of aorta: Secondary | ICD-10-CM | POA: Diagnosis not present

## 2022-09-04 DIAGNOSIS — T8131XS Disruption of external operation (surgical) wound, not elsewhere classified, sequela: Secondary | ICD-10-CM | POA: Diagnosis not present

## 2022-09-11 ENCOUNTER — Telehealth: Payer: Self-pay | Admitting: Hematology and Oncology

## 2022-09-11 NOTE — Telephone Encounter (Signed)
Patient called to r/s upcoming appointments due to family pet being ill. Patient r/s and notified.

## 2022-09-12 ENCOUNTER — Other Ambulatory Visit: Payer: Medicare Other

## 2022-09-12 ENCOUNTER — Ambulatory Visit: Payer: Medicare Other | Admitting: Hematology and Oncology

## 2022-09-12 ENCOUNTER — Ambulatory Visit: Payer: Medicare Other

## 2022-09-12 ENCOUNTER — Ambulatory Visit: Payer: Medicare Other | Admitting: Physician Assistant

## 2022-09-14 ENCOUNTER — Inpatient Hospital Stay: Payer: Medicare Other

## 2022-09-14 ENCOUNTER — Inpatient Hospital Stay: Payer: Medicare Other | Attending: Hematology and Oncology

## 2022-09-14 ENCOUNTER — Other Ambulatory Visit: Payer: Self-pay | Admitting: Hematology and Oncology

## 2022-09-14 ENCOUNTER — Inpatient Hospital Stay (HOSPITAL_BASED_OUTPATIENT_CLINIC_OR_DEPARTMENT_OTHER): Payer: Medicare Other | Admitting: Hematology and Oncology

## 2022-09-14 VITALS — BP 128/79 | HR 75 | Temp 98.1°F | Resp 16 | Wt 197.6 lb

## 2022-09-14 DIAGNOSIS — D649 Anemia, unspecified: Secondary | ICD-10-CM | POA: Diagnosis not present

## 2022-09-14 DIAGNOSIS — Z79899 Other long term (current) drug therapy: Secondary | ICD-10-CM | POA: Diagnosis not present

## 2022-09-14 DIAGNOSIS — C9000 Multiple myeloma not having achieved remission: Secondary | ICD-10-CM

## 2022-09-14 DIAGNOSIS — D72819 Decreased white blood cell count, unspecified: Secondary | ICD-10-CM | POA: Insufficient documentation

## 2022-09-14 DIAGNOSIS — D539 Nutritional anemia, unspecified: Secondary | ICD-10-CM | POA: Diagnosis not present

## 2022-09-14 LAB — CBC WITH DIFFERENTIAL (CANCER CENTER ONLY)
Abs Immature Granulocytes: 0.02 10*3/uL (ref 0.00–0.07)
Basophils Absolute: 0.1 10*3/uL (ref 0.0–0.1)
Basophils Relative: 3 %
Eosinophils Absolute: 0.2 10*3/uL (ref 0.0–0.5)
Eosinophils Relative: 6 %
HCT: 40 % (ref 39.0–52.0)
Hemoglobin: 14.3 g/dL (ref 13.0–17.0)
Immature Granulocytes: 1 %
Lymphocytes Relative: 27 %
Lymphs Abs: 1 10*3/uL (ref 0.7–4.0)
MCH: 37.8 pg — ABNORMAL HIGH (ref 26.0–34.0)
MCHC: 35.8 g/dL (ref 30.0–36.0)
MCV: 105.8 fL — ABNORMAL HIGH (ref 80.0–100.0)
Monocytes Absolute: 0.6 10*3/uL (ref 0.1–1.0)
Monocytes Relative: 15 %
Neutro Abs: 1.8 10*3/uL (ref 1.7–7.7)
Neutrophils Relative %: 48 %
Platelet Count: 160 10*3/uL (ref 150–400)
RBC: 3.78 MIL/uL — ABNORMAL LOW (ref 4.22–5.81)
RDW: 14.3 % (ref 11.5–15.5)
WBC Count: 3.7 10*3/uL — ABNORMAL LOW (ref 4.0–10.5)
nRBC: 0 % (ref 0.0–0.2)

## 2022-09-14 LAB — CMP (CANCER CENTER ONLY)
ALT: 26 U/L (ref 0–44)
AST: 21 U/L (ref 15–41)
Albumin: 3.9 g/dL (ref 3.5–5.0)
Alkaline Phosphatase: 87 U/L (ref 38–126)
Anion gap: 9 (ref 5–15)
BUN: 14 mg/dL (ref 8–23)
CO2: 25 mmol/L (ref 22–32)
Calcium: 9.1 mg/dL (ref 8.9–10.3)
Chloride: 106 mmol/L (ref 98–111)
Creatinine: 0.81 mg/dL (ref 0.61–1.24)
GFR, Estimated: >60 mL/min (ref >60)
Glucose, Bld: 105 mg/dL — ABNORMAL HIGH (ref 70–99)
Potassium: 4 mmol/L (ref 3.5–5.1)
Sodium: 140 mmol/L (ref 135–145)
Total Bilirubin: 0.9 mg/dL (ref 0.3–1.2)
Total Protein: 6.1 g/dL — ABNORMAL LOW (ref 6.5–8.1)

## 2022-09-14 LAB — LACTATE DEHYDROGENASE: LDH: 140 U/L (ref 98–192)

## 2022-09-14 MED ORDER — DENOSUMAB 120 MG/1.7ML ~~LOC~~ SOLN
120.0000 mg | Freq: Once | SUBCUTANEOUS | Status: AC
  Administered 2022-09-14: 120 mg via SUBCUTANEOUS
  Filled 2022-09-14: qty 1.7

## 2022-09-15 LAB — KAPPA/LAMBDA LIGHT CHAINS
Kappa free light chain: 11.8 mg/L (ref 3.3–19.4)
Kappa, lambda light chain ratio: 1.79 — ABNORMAL HIGH (ref 0.26–1.65)
Lambda free light chains: 6.6 mg/L (ref 5.7–26.3)

## 2022-09-19 LAB — MULTIPLE MYELOMA PANEL, SERUM
Albumin SerPl Elph-Mcnc: 3.6 g/dL (ref 2.9–4.4)
Albumin/Glob SerPl: 1.8 — ABNORMAL HIGH (ref 0.7–1.7)
Alpha 1: 0.2 g/dL (ref 0.0–0.4)
Alpha2 Glob SerPl Elph-Mcnc: 0.6 g/dL (ref 0.4–1.0)
B-Globulin SerPl Elph-Mcnc: 0.9 g/dL (ref 0.7–1.3)
Gamma Glob SerPl Elph-Mcnc: 0.3 g/dL — ABNORMAL LOW (ref 0.4–1.8)
Globulin, Total: 2.1 g/dL — ABNORMAL LOW (ref 2.2–3.9)
IgA: 101 mg/dL (ref 61–437)
IgG (Immunoglobin G), Serum: 353 mg/dL — ABNORMAL LOW (ref 603–1613)
IgM (Immunoglobulin M), Srm: 25 mg/dL (ref 15–143)
Total Protein ELP: 5.7 g/dL — ABNORMAL LOW (ref 6.0–8.5)

## 2022-09-22 ENCOUNTER — Other Ambulatory Visit: Payer: Self-pay

## 2022-09-22 MED ORDER — LENALIDOMIDE 10 MG PO CAPS: 10.0000 mg | ORAL_CAPSULE | Freq: Every day | ORAL | 0 refills | Status: DC

## 2022-09-28 ENCOUNTER — Telehealth: Payer: Self-pay

## 2022-09-28 NOTE — Telephone Encounter (Signed)
Patient called. One episode of dizziness. Patient reports being well hydrated. No other positive symptoms. Reviewed fall precautions.   Patient will document additional episodes to share with Dr. Lorenso Courier at next apt.

## 2022-10-01 ENCOUNTER — Encounter: Payer: Self-pay | Admitting: Hematology and Oncology

## 2022-10-01 NOTE — Progress Notes (Signed)
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 Further workup showed SPEP M-Spike 4.2 g/dL with IgG-Kappa as monoclonal protein, IgG 5908 mg/dL 09/08/2021: Initial evaluation at [facility]. bone marrow biopsy performed, showed 60% abnormal plasma cells in 60% cellular marrow. 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  09/26/2021: transfer care to Dr. Lorenso Courier at [facility].  09/30/2021: Cycle 1 Day 1 of Dara/Dex 10/06/2021: Cycle 1 Day 8 (added revlimid) 10/28/2021: Cycle 2 Day 1 of Dara/Rev/Dex 11/25/2021: Cycle 3 Day 1 of Dara/Rev/Dex 5/12/023: Cycle 4 Day 1 of Dara/Rev/Dex 01/20/2022: Cycle 5 Day 1 of Dara/Rev/Dex 02/17/2022: Cycle 6 Day 1 of Dara/Rev/Dex 03/17/2022: Cycle 7 Day 1 of Dara/Rev/Dex 04/14/2022: Cycle 8 Day 1 of Dara/Rev/Dex 05/22/2022: Cycle 9 Day 1 of Dara/Rev/Dex 06/19/2022: Cycle 10 Day 1 of Dara/Rev/Dex 07/17/2022: Cycle 11 Day 1 of Dara/Rev/Dex 08/17/2022: start maintenance revlimid 10 mg PO daily 21 of 28 days.   Interval History:  Wesley [name] 82 y.o. male with medical history significant for IgG kappa multiple myeloma who presents for a follow up visit. The patient's last visit was on 08/17/2022. In the interim since the last visit he continued treatment with maintenance revlimid.   On exam today Wesley [name] reports he has had improvement in his energy levels.  He reports that he plans on cutting the grass tomorrow and has  been working on getting all the leaves of the oak trees.  He reports that he has been eating well.  He is not having any issues with lightheadedness, dizziness or shortness of breath.  He also recently met with the surgeon who reports that they are not recommending surgery right now for his boils.  There reports that he has continued on his Revlimid therapy 21 days on and 7 days off with no major side effects.   He is not having any nausea, vomiting, or diarrhea.  He denies fevers, chills, night sweats, shortness of breath, chest pain or cough. He has no other complaints. Full 10 point ROS is listed below.  Overall he is willing and able to proceed with continued treatment at this time.  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 PT denies significant findings   CARDIAC CATHETERIZATION  2008   COLOSTOMY N/A 10/31/2019   Procedure: COLOSTOMY;  Surgeon: Erroll Luna, MD;  Location: [facility];  Service: General;  Laterality: N/A;   CYSTOSCOPY W/ URETERAL STENT PLACEMENT Bilateral  10/31/2019   Procedure: CYSTOSCOPY URETERAL STENT PLACEMENT BILATERAL;  Surgeon: Franchot Gallo, MD;  Location: [facility];  Service: Urology;  Laterality: Bilateral;   CYSTOSCOPY WITH STENT PLACEMENT Bilateral 03/11/2020   Procedure: CYSTOSCOPY WITH BILATERAL FIREFLY INJECTION;  Surgeon: Irine Seal, MD;  Location: [facility];  Service: Urology;  Laterality: Bilateral;   EYE SURGERY     BILATERAL CATARACT SURGERY WITH LENS IMPLANTS   FLEXIBLE SIGMOIDOSCOPY N/A 10/29/2019   Procedure: FLEXIBLE SIGMOIDOSCOPY;  Surgeon: Jerene Bears, MD;  Location: [facility];  Service: Gastroenterology;  Laterality: N/A;   INCISIONAL HERNIA REPAIR N/A 10/13/2020   Procedure: OPEN INCISIONAL HERNIA REPAIR WITH MESH;  Surgeon: [name], MD;  Location: [place];  Service: General;  Laterality: N/A;   INSERTION OF MESH N/A 09/08/2016   Procedure: INSERTION OF MESH;  Surgeon: Jackolyn Confer, MD;  Location: [facility];  Service: General;  Laterality: N/A;   IR RADIOLOGIST EVAL & MGMT  11/10/2020   IR RADIOLOGIST EVAL & MGMT  11/25/2020   LAPAROSCOPIC SIGMOID COLECTOMY N/A 10/31/2019   Procedure: DIAGNOSTIC LAPAROSCOPY; EXPLORATORY LAPAROTOMY; SIGMOID COLECTOMY;  Surgeon: Erroll Luna, MD;  Location: [facility];  Service: General;  Laterality: N/A;   right rotator cuff     SUBMUCOSAL TATTOO INJECTION  10/29/2019   Procedure: SUBMUCOSAL TATTOO INJECTION;  Surgeon: Jerene Bears, MD;  Location: [facility];  Service: Gastroenterology;;   TONSILLECTOMY     UMBILICAL HERNIA REPAIR N/A 09/08/2016   Procedure: OPEN UMBILICAL HERNIA REPAIR WITH MESH;  Surgeon: Jackolyn Confer, MD;  [facility];  Service: General;  Laterality: N/A;   VENTRAL HERNIA REPAIR N/A 02/03/2021   Procedure: LAPAROSCOPIC VENTRAL HERNIA REPAIR WITH MESH;  Surgeon: [name], MD;  Location: [place];  Service: General;  Laterality: N/A;   XI ROBOTIC ASSISTED COLOSTOMY TAKEDOWN N/A 03/11/2020   Procedure: ROBOTIC ASSISTED COLOSTOMY REVERSAL, RIGID  PROCTOSCOPY;  Surgeon: Leighton Ruff, MD;  Location: [facility];  Service: General;  Laterality: N/A;    SOCIAL HISTORY: Social History   Socioeconomic History   Marital status: Married    Spouse name: Trevis Nylen    Number of children: 2   Years of education: 12   Highest education level: Master's degree (e.g., MA, MS, MEng, MEd, MSW, MBA)  Occupational History   Occupation: Retired   Tobacco Use   Smoking status: Never   Smokeless tobacco: Never  Vaping Use   Vaping Use: Never used  Substance and Sexual Activity   Alcohol use: Yes    Alcohol/week: 17.0 standard drinks of alcohol    Types: 3 Glasses of wine, 14 Standard drinks or equivalent per week   Drug use: No   Sexual activity: Not Currently  Other Topics Concern   Not on file  Social History Narrative   Married. 2 children 52 in 10 in 2022- both went to [employer]. 5 grandkids- all girls from oldest 32 looking premed [employer], 81 soph [place], 9 51 year olds, 73 year olds.       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 [employer]: fishing, [employer] football and basketball, [place], [place] Strain: [place]  (05/08/2022)   Overall Financial Resource Strain (CARDIA)    Difficulty of Paying Living Expenses: Not hard at all  Food Insecurity: No Food Insecurity (07/12/2022)   Hunger Vital Sign    Worried About Running Out of Food in the Last Year: Never true    [place] in the Last Year: Never true  Transportation Needs: No Transportation Needs (07/12/2022)   PRAPARE - Hydrologist (Medical): No    Lack of Transportation (Non-Medical): No  Physical Activity: Insufficiently Active (05/08/2022)   Exercise Vital Sign    Days of Exercise per Week: 3 days    Minutes of Exercise per Session: 20 min  Stress: No Stress  Concern Present (05/08/2022)   [place]    Feeling of Stress : Only a little  Social Connections: Socially Integrated (05/08/2022)   Social Connection and Isolation Panel [NHANES]    Frequency of Communication with Friends and Family: More than three times a week    Frequency of Social Gatherings with Friends and Family: Once a week    Attends Religious Services: More than 4 times per year    Active Member of [employer] or Organizations: Yes    Attends Music therapist: More than 4 times per year    Marital Status: Married  Human resources officer Violence: Not At Risk (05/08/2022)   Humiliation, Afraid, Rape, and Kick questionnaire    Fear of Current or Ex-Partner: No    Emotionally Abused: No    Physically Abused: No    Sexually Abused: No    FAMILY HISTORY: Family History  Problem Relation Age of Onset   Diabetes Mother    Heart failure Mother        around 94   CAD Father        55   Melanoma Brother 22   Healthy Daughter    Healthy Son    Colon cancer Neg Hx    Esophageal cancer Neg Hx    Rectal cancer Neg Hx    Stomach cancer Neg Hx     ALLERGIES:  is allergic to demerol, promethazine hcl, and ativan [lorazepam].  MEDICATIONS:  Current Outpatient Medications  Medication Sig Dispense Refill   acetaminophen (TYLENOL) 500 MG tablet Take 500 mg by mouth every 6 (six) hours as needed.     acyclovir (ZOVIRAX) 400 MG tablet Take 1 tablet (400 mg total) by mouth in the morning and at bedtime. 60 tablet 2   allopurinol (ZYLOPRIM) 300 MG tablet Take 1 tablet (300 mg total) by mouth daily. 60 tablet 2   aspirin EC 81 MG tablet Take 81 mg by mouth daily. Swallow whole.     atorvastatin (LIPITOR) 80 MG tablet Take 1 tablet (80 mg total) by mouth daily. 90 tablet 3   Cholecalciferol (VITAMIN D3) 25 MCG (1000 UT) CAPS Take 1,000 Units by mouth daily.     dexamethasone (DECADRON) 4 MG tablet Take 5 tablets (20 mg  total) by mouth once a week. 20 tablet 3   fexofenadine (ALLEGRA) 180 MG tablet      finasteride (PROSCAR) 5 MG tablet Take 5 mg by mouth daily.  fluticasone (FLONASE) 50 MCG/ACT nasal spray Place into the nose.     Ibuprofen 200 MG CAPS      lenalidomide (REVLIMID) 10 MG capsule Take 1 capsule (10 mg total) by mouth daily. Fanny Dance [id number]   Date Obtained 09/22/22 Take 1 capsule daily for 21 days and then none for 7 days. 21 capsule 0   lisinopril (ZESTRIL) 10 MG tablet TAKE 1 TABLET EVERY DAY 90 tablet 3   loratadine (CLARITIN) 10 MG tablet Take 10 mg by mouth daily.     Magnesium Oxide 400 MG CAPS      melatonin 5 MG TABS      Omega-3 Fatty Acids (FISH OIL) 1200 MG CAPS Take 1,200 mg by mouth daily.     ondansetron (ZOFRAN) 8 MG tablet Take 1 tablet (8 mg total) by mouth every 8 (eight) hours as needed. 30 tablet 0   pantoprazole (PROTONIX) 40 MG tablet Take by mouth.     prochlorperazine (COMPAZINE) 10 MG tablet Take 1 tablet (10 mg total) by mouth every 6 (six) hours as needed for nausea or vomiting. 30 tablet 0   vitamin B-12 (CYANOCOBALAMIN) 1000 MCG tablet Take 1,000 mcg by mouth daily.     No current facility-administered medications for this visit.    REVIEW OF SYSTEMS:   Constitutional: ( - ) fevers, ( - )  chills , ( - ) night sweats Eyes: ( - ) blurriness of vision, ( - ) double vision, ( - ) watery eyes Ears, nose, mouth, throat, and face: ( - ) mucositis, ( - ) sore throat Respiratory: ( - ) cough, ( - ) dyspnea, ( - ) wheezes Cardiovascular: ( - ) palpitation, ( - ) chest discomfort, ( - ) lower extremity swelling Gastrointestinal:  ( - ) nausea, ( - ) heartburn, ( - ) change in bowel habits Skin: ( - ) abnormal skin rashes Lymphatics: ( - ) new lymphadenopathy, ( - ) easy bruising Neurological: ( + ) numbness, ( - ) tingling, ( - ) new weaknesses Behavioral/Psych: ( - ) mood change, ( - ) new changes  All other systems were reviewed with the patient and are  negative.  PHYSICAL EXAMINATION: ECOG PERFORMANCE STATUS: 1 - Symptomatic but completely ambulatory  Vitals:   09/14/22 1015  BP: 128/79  Pulse: 75  Resp: 16  Temp: 98.1 F (36.7 C)  SpO2: 98%      Filed Weights   09/14/22 1015  Weight: 197 lb 9.6 oz (89.6 kg)       GENERAL: Well-appearing elderly Caucasian male, alert, no distress and comfortable SKIN: skin color, texture, turgor are normal, no rashes or significant lesions EYES: conjunctiva are pink and non-injected, sclera clear LUNGS: clear to auscultation and percussion with normal breathing effort HEART: regular rate & rhythm and no murmurs and no lower extremity edema ABDOMEN: covered wound sites, no erythema or however purulent discharge from 2 of the lesions.  1 new lesion on this visit. Musculoskeletal: no cyanosis of digits and no clubbing  PSYCH: alert & oriented x 3, fluent speech NEURO: no focal motor/sensory deficits  LABORATORY DATA:  I have reviewed the data as listed    Latest Ref Rng & Units 09/14/2022    9:44 AM 08/17/2022   11:15 AM 07/17/2022   10:14 AM  CBC  WBC 4.0 - 10.5 K/uL 3.7  3.9  3.8   Hemoglobin 13.0 - 17.0 g/dL 14.3  14.0  14.0   Hematocrit 39.0 - 52.0 %  40.0  39.5  38.9   Platelets 150 - 400 K/uL 160  155  167        Latest Ref Rng & Units 09/14/2022    9:44 AM 08/17/2022   11:15 AM 07/17/2022   10:14 AM  CMP  Glucose 70 - 99 mg/dL 105  115  153   BUN 8 - 23 mg/dL 14  13  14   $ Creatinine 0.61 - 1.24 mg/dL 0.81  0.98  0.89   Sodium 135 - 145 mmol/L 140  141  140   Potassium 3.5 - 5.1 mmol/L 4.0  3.8  4.3   Chloride 98 - 111 mmol/L 106  104  105   CO2 22 - 32 mmol/L 25  29  29   $ Calcium 8.9 - 10.3 mg/dL 9.1  9.4  9.4   Total Protein 6.5 - 8.1 g/dL 6.1  5.8  6.3   Total Bilirubin 0.3 - 1.2 mg/dL 0.9  0.9  0.7   Alkaline Phos 38 - 126 U/L 87  87  73   AST 15 - 41 U/L 21  24  22   $ ALT 0 - 44 U/L 26  31  29     $ Lab Results  Component Value Date   MPROTEIN Not Observed  09/14/2022   MPROTEIN Not Observed 08/17/2022   MPROTEIN Not Observed 06/19/2022   Lab Results  Component Value Date   KPAFRELGTCHN 11.8 09/14/2022   KPAFRELGTCHN 11.1 08/17/2022   KPAFRELGTCHN 10.0 07/17/2022   LAMBDASER 6.6 09/14/2022   LAMBDASER 6.3 08/17/2022   LAMBDASER 6.1 07/17/2022   KAPLAMBRATIO 1.79 (H) 09/14/2022   KAPLAMBRATIO 1.76 (H) 08/17/2022   KAPLAMBRATIO 1.64 07/17/2022    RADIOGRAPHIC STUDIES: No results found.  ASSESSMENT & PLAN  JAHMANI SHIA 82 y.o. male with medical history significant for IgG kappa multiple myeloma who presents for a follow up visit.   The patient meets diagnostic criteria by having greater than 60% plasma cells in his bone marrow.  At this time we are in agreement with [facility] [place], Revlimid, and dexamethasone as the treatment of choice.   Previously we discussed the concept of comanaged care.  Comanaged care is when the patient has a local primary provider who administers local support and therapy while expert advice and treatment recommendations are rendered by a cancer specialist at a large academic center.  In this arrangement we provide local support, labs, treatment, and emergency visits, however the major decisions regarding the course of treatment are decided by a physician at an academic medical center.  The patient voiced his understanding of comanaged care and was agreeable to proceeding forward with this care model.   The regimen of choice for this patient will be Dara, Revlimid, and dexamethasone.  We will start with weekly treatments for the first 2 cycles.  After that for an additional 4 cycles he will be getting it every other week.  After cycle 6 the patient will then continue with monthly treatment.    # IgG Kappa Multiple Myeloma --Patient meets diagnostic criteria for multiple myeloma based on the presence of 60% plasma cells in the bone marrow --Pretreatment phenotype RBC as well as type  and screen ordered  --Cycle 1 Day 1of Dara/Rev/Dex started on 09/30/2021. He started without revlimid which was added on 10/06/2021 (Cycle 1 Day 8)  -- Labs to include weekly CBC, CMP, LDH.  Additionally monthly SPEP and serum free light chains. Plan:  --patient is currently on  maintenance revlimid 10 mg PO daily 21 of 28 days.  --Labs today were reviewed and adequate for treatment.  Additionally his creatinine and LFT are WNL.  -- Return to clinic in 4 weeks continued monitoring on maintenance revlimid.   #Macrocytosis #Leukopenia-stable # Thrombocytopenia-resolved --WBC 3.7, hemoglobin 14.3, MCV 105.8, and platelets of 160. --Levels fluctuating with treatment.  #Lethargy 2/2 Zometa:  --Discussed alternative which would be Xgeva q 28 days.  -- continue monthly Xgeva.    # Boils on Abdomen -- Patient underwent I&D and subsequent antibiotic treatment with no resolution.  2 lesions have developed at the site of his prior ostomy.  A new lesion was present on 06/19/2022. --Patient was seen by surgery who ordered CT scan on 01/31/2022 that showed no fluid collection in the anterior abdominal wall. Instructed patient on how to pack wounds.  --No systemic symptoms, okay to continue with systemic chemotherapy. --continue to follow with surgery.   #Neuropathy:  --Stable and does not interfere with grip or balance --Continue to monitor.    #Supportive Care -- chemotherapy education complete  -- port placement not required.  --ASA 81 mg p.o. daily for thromboprophylaxis on Revlimid. -- zofran 51m q8H PRN and compazine 142mPO q6H for nausea -- zometa clearance obtained, started on 03/03/2022. Had lethargy with zometa, will be converting to Xgeva.   -- no pain medication required at this time.   No orders of the defined types were placed in this encounter.  All questions were answered. The patient knows to call the clinic with any problems, questions or concerns.  I have spent a total of 30  minutes minutes of face-to-face and non-face-to-face time, preparing to see the patient, performing a medically appropriate examination, counseling and educating the patient, ordering medications/tests, documenting clinical information in the electronic health record,  and care coordination.   JoLedell PeoplesMD Department of Hematology/Oncology CoDavidsont WeLubbock Surgery Centerhone: [phone]: [phone]: [email or web address]  10/01/2022 11:40 AM

## 2022-10-10 ENCOUNTER — Ambulatory Visit: Payer: Medicare Other

## 2022-10-10 ENCOUNTER — Inpatient Hospital Stay: Payer: Medicare Other | Admitting: Hematology and Oncology

## 2022-10-10 ENCOUNTER — Inpatient Hospital Stay: Payer: Medicare Other

## 2022-10-12 ENCOUNTER — Other Ambulatory Visit: Payer: Self-pay | Admitting: Hematology and Oncology

## 2022-10-12 ENCOUNTER — Inpatient Hospital Stay: Payer: Medicare Other

## 2022-10-12 ENCOUNTER — Inpatient Hospital Stay (HOSPITAL_BASED_OUTPATIENT_CLINIC_OR_DEPARTMENT_OTHER): Payer: Medicare Other | Admitting: Hematology and Oncology

## 2022-10-12 ENCOUNTER — Ambulatory Visit: Payer: Medicare Other

## 2022-10-12 VITALS — BP 122/68 | HR 79 | Temp 97.2°F | Resp 18 | Wt 197.6 lb

## 2022-10-12 DIAGNOSIS — D539 Nutritional anemia, unspecified: Secondary | ICD-10-CM | POA: Diagnosis not present

## 2022-10-12 DIAGNOSIS — C9000 Multiple myeloma not having achieved remission: Secondary | ICD-10-CM

## 2022-10-12 DIAGNOSIS — D72819 Decreased white blood cell count, unspecified: Secondary | ICD-10-CM | POA: Diagnosis not present

## 2022-10-12 DIAGNOSIS — Z79899 Other long term (current) drug therapy: Secondary | ICD-10-CM | POA: Diagnosis not present

## 2022-10-12 DIAGNOSIS — D649 Anemia, unspecified: Secondary | ICD-10-CM | POA: Diagnosis not present

## 2022-10-12 LAB — CBC WITH DIFFERENTIAL (CANCER CENTER ONLY)
Abs Immature Granulocytes: 0.04 10*3/uL (ref 0.00–0.07)
Basophils Absolute: 0.2 10*3/uL — ABNORMAL HIGH (ref 0.0–0.1)
Basophils Relative: 3 %
Eosinophils Absolute: 0.4 10*3/uL (ref 0.0–0.5)
Eosinophils Relative: 8 %
HCT: 44.2 % (ref 39.0–52.0)
Hemoglobin: 15.4 g/dL (ref 13.0–17.0)
Immature Granulocytes: 1 %
Lymphocytes Relative: 22 %
Lymphs Abs: 1 10*3/uL (ref 0.7–4.0)
MCH: 37.8 pg — ABNORMAL HIGH (ref 26.0–34.0)
MCHC: 34.8 g/dL (ref 30.0–36.0)
MCV: 108.6 fL — ABNORMAL HIGH (ref 80.0–100.0)
Monocytes Absolute: 0.6 10*3/uL (ref 0.1–1.0)
Monocytes Relative: 13 %
Neutro Abs: 2.4 10*3/uL (ref 1.7–7.7)
Neutrophils Relative %: 53 %
Platelet Count: 141 10*3/uL — ABNORMAL LOW (ref 150–400)
RBC: 4.07 MIL/uL — ABNORMAL LOW (ref 4.22–5.81)
RDW: 13.7 % (ref 11.5–15.5)
WBC Count: 4.6 10*3/uL (ref 4.0–10.5)
nRBC: 0 % (ref 0.0–0.2)

## 2022-10-12 LAB — CMP (CANCER CENTER ONLY)
ALT: 26 U/L (ref 0–44)
AST: 22 U/L (ref 15–41)
Albumin: 4.2 g/dL (ref 3.5–5.0)
Alkaline Phosphatase: 94 U/L (ref 38–126)
Anion gap: 9 (ref 5–15)
BUN: 15 mg/dL (ref 8–23)
CO2: 25 mmol/L (ref 22–32)
Calcium: 8.6 mg/dL — ABNORMAL LOW (ref 8.9–10.3)
Chloride: 105 mmol/L (ref 98–111)
Creatinine: 0.83 mg/dL (ref 0.61–1.24)
GFR, Estimated: >60 mL/min (ref >60)
Glucose, Bld: 126 mg/dL — ABNORMAL HIGH (ref 70–99)
Potassium: 3.9 mmol/L (ref 3.5–5.1)
Sodium: 139 mmol/L (ref 135–145)
Total Bilirubin: 1 mg/dL (ref 0.3–1.2)
Total Protein: 6 g/dL — ABNORMAL LOW (ref 6.5–8.1)

## 2022-10-12 LAB — LACTATE DEHYDROGENASE: LDH: 132 U/L (ref 98–192)

## 2022-10-12 MED ORDER — DENOSUMAB 120 MG/1.7ML ~~LOC~~ SOLN
120.0000 mg | Freq: Once | SUBCUTANEOUS | Status: AC
  Administered 2022-10-12: 120 mg via SUBCUTANEOUS
  Filled 2022-10-12: qty 1.7

## 2022-10-12 NOTE — Progress Notes (Signed)
Ok to give xgeva with calcium of 8.6 per Dr. Lorenso Courier.

## 2022-10-12 NOTE — Patient Instructions (Signed)
Denosumab Injection (Oncology) What is this medication? DENOSUMAB (den oh SUE mab) prevents weakened bones caused by cancer. It may also be used to treat noncancerous bone tumors that cannot be removed by surgery. It can also be used to treat high calcium levels in the blood caused by cancer. It works by blocking a protein that causes bones to break down quickly. This slows down the release of calcium from bones, which lowers calcium levels in your blood. It also makes your bones stronger and less likely to break (fracture). This medicine may be used for other purposes; ask your health care provider or pharmacist if you have questions. COMMON BRAND NAME(S): XGEVA What should I tell my care team before I take this medication? They need to know if you have any of these conditions: Dental disease Having surgery or tooth extraction Infection Kidney disease Low levels of calcium or vitamin D in the blood Malnutrition On hemodialysis Skin conditions or sensitivity Thyroid or parathyroid disease An unusual reaction to denosumab, other medications, foods, dyes, or preservatives Pregnant or trying to get pregnant Breast-feeding How should I use this medication? This medication is for injection under the skin. It is given by your care team in a hospital or clinic setting. A special MedGuide will be given to you before each treatment. Be sure to read this information carefully each time. Talk to your care team about the use of this medication in children. While it may be prescribed for children as young as 13 years for selected conditions, precautions do apply. Overdosage: If you think you have taken too much of this medicine contact a poison control center or emergency room at once. NOTE: This medicine is only for you. Do not share this medicine with others. What if I miss a dose? Keep appointments for follow-up doses. It is important not to miss your dose. Call your care team if you are unable to  keep an appointment. What may interact with this medication? Do not take this medication with any of the following: Other medications containing denosumab This medication may also interact with the following: Medications that lower your chance of fighting infection Steroid medications, such as prednisone or cortisone This list may not describe all possible interactions. Give your health care provider a list of all the medicines, herbs, non-prescription drugs, or dietary supplements you use. Also tell them if you smoke, drink alcohol, or use illegal drugs. Some items may interact with your medicine. What should I watch for while using this medication? Your condition will be monitored carefully while you are receiving this medication. You may need blood work while taking this medication. This medication may increase your risk of getting an infection. Call your care team for advice if you get a fever, chills, sore throat, or other symptoms of a cold or flu. Do not treat yourself. Try to avoid being around people who are sick. You should make sure you get enough calcium and vitamin D while you are taking this medication, unless your care team tells you not to. Discuss the foods you eat and the vitamins you take with your care team. Some people who take this medication have severe bone, joint, or muscle pain. This medication may also increase your risk for jaw problems or a broken thigh bone. Tell your care team right away if you have severe pain in your jaw, bones, joints, or muscles. Tell your care team if you have any pain that does not go away or that gets worse. Talk   to your care team if you may be pregnant. Serious birth defects can occur if you take this medication during pregnancy and for 5 months after the last dose. You will need a negative pregnancy test before starting this medication. Contraception is recommended while taking this medication and for 5 months after the last dose. Your care team  can help you find the option that works for you. What side effects may I notice from receiving this medication? Side effects that you should report to your care team as soon as possible: Allergic reactions--skin rash, itching, hives, swelling of the face, lips, tongue, or throat Bone, joint, or muscle pain Low calcium level--muscle pain or cramps, confusion, tingling, or numbness in the hands or feet Osteonecrosis of the jaw--pain, swelling, or redness in the mouth, numbness of the jaw, poor healing after dental work, unusual discharge from the mouth, visible bones in the mouth Side effects that usually do not require medical attention (report to your care team if they continue or are bothersome): Cough Diarrhea Fatigue Headache Nausea This list may not describe all possible side effects. Call your doctor for medical advice about side effects. You may report side effects to FDA at 1-800-FDA-1088. Where should I keep my medication? This medication is given in a hospital or clinic. It will not be stored at home. NOTE: This sheet is a summary. It may not cover all possible information. If you have questions about this medicine, talk to your doctor, pharmacist, or health care provider.  2023 Elsevier/Gold Standard (2021-12-19 00:00:00)  

## 2022-10-12 NOTE — Progress Notes (Signed)
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 Further workup showed SPEP M-Spike 4.2 g/dL with IgG-Kappa as monoclonal protein, IgG 5908 mg/dL 09/08/2021: Initial evaluation at [facility]. bone marrow biopsy performed, showed 60% abnormal plasma cells in 60% cellular marrow. 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  09/26/2021: transfer care to Dr. Lorenso Courier at [facility]  09/30/2021: Cycle 1 Day 1 of Dara/Dex 10/06/2021: Cycle 1 Day 8 (added revlimid) 10/28/2021: Cycle 2 Day 1 of Dara/Rev/Dex 11/25/2021: Cycle 3 Day 1 of Dara/Rev/Dex 5/12/023: Cycle 4 Day 1 of Dara/Rev/Dex 01/20/2022: Cycle 5 Day 1 of Dara/Rev/Dex 02/17/2022: Cycle 6 Day 1 of Dara/Rev/Dex 03/17/2022: Cycle 7 Day 1 of Dara/Rev/Dex 04/14/2022: Cycle 8 Day 1 of Dara/Rev/Dex 05/22/2022: Cycle 9 Day 1 of Dara/Rev/Dex 06/19/2022: Cycle 10 Day 1 of Dara/Rev/Dex 07/17/2022: Cycle 11 Day 1 of Dara/Rev/Dex 08/17/2022: start maintenance revlimid 10 mg PO daily 21 of 28 days.   Interval History:  Wesley [name] 82 y.o. male with medical history significant for IgG kappa multiple myeloma who presents for a follow up visit. The patient's last visit was on 09/14/2022. In the interim since the last visit he continued treatment with maintenance revlimid.   On exam today [name] reports he has been well overall interim since her last visit.  He reports he recently got a new puppy and it is quite  active and keeping him busy.  He notes that he enjoys having the dogs but thinks that it is a lot of work.  He reports that he has also expressed interest in working with a Physiological scientist in order to begin working out more consistently.  He reports his appetite is quite good.  He is not having any difficulties with the Revlimid pills.  He notes some occasional upset stomach but it does not occur often.  He reports when he does have some loose stools he takes Imodium and it has been effective in treating that.  Otherwise has been at his baseline level of health.  He denies fevers, chills, night sweats, shortness of breath, chest pain or cough. He has no other complaints. Full 10 point ROS is listed below.  Overall he is willing and able to proceed with continued maintenance revlimid treatment at this time.  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)    CAD (coronary artery disease)    Cholelithiasis    Chronic allergic rhinitis    Coronary atherosclerosis of native coronary artery    Fatty liver    GERD (gastroesophageal reflux disease)    HX 78yr ago, no longer a problem, no meds   High blood pressure    Hypercholesterolemia    Macrocytosis without anemia    [place]   OSA on CPAP    Paraesophageal hernia    Partial bowel obstruction (HCC)    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 PT denies significant findings   CARDIAC CATHETERIZATION  2008   COLOSTOMY N/A 10/31/2019   Procedure: COLOSTOMY;  Surgeon: Erroll Luna, MD;  Location: [facility];  Service: General;  Laterality: N/A;   CYSTOSCOPY W/ URETERAL STENT PLACEMENT Bilateral 10/31/2019   Procedure: CYSTOSCOPY URETERAL STENT PLACEMENT BILATERAL;  Surgeon: Franchot Gallo, MD;  Location: [facility];  Service: Urology;  Laterality: Bilateral;   CYSTOSCOPY WITH STENT PLACEMENT Bilateral 03/11/2020   Procedure: CYSTOSCOPY WITH BILATERAL FIREFLY INJECTION;  Surgeon: Irine Seal, MD;  Location: [facility];  Service: Urology;  Laterality: Bilateral;   EYE SURGERY     BILATERAL CATARACT SURGERY WITH LENS IMPLANTS   FLEXIBLE SIGMOIDOSCOPY N/A 10/29/2019   Procedure: FLEXIBLE SIGMOIDOSCOPY;  Surgeon: Jerene Bears, MD;  [facility]: [facility];  Service: Gastroenterology;  Laterality: N/A;   INCISIONAL HERNIA REPAIR N/A 10/13/2020   Procedure: OPEN INCISIONAL HERNIA REPAIR WITH MESH;  Surgeon: [name], MD;  Location: [place];  Service: General;  Laterality: N/A;   INSERTION OF MESH N/A 09/08/2016   Procedure: INSERTION OF MESH;  Surgeon: Jackolyn Confer, MD;  Location: [facility];  Service: General;  Laterality: N/A;   IR RADIOLOGIST EVAL & MGMT  11/10/2020   IR RADIOLOGIST EVAL & MGMT  11/25/2020   LAPAROSCOPIC SIGMOID COLECTOMY N/A 10/31/2019   Procedure: DIAGNOSTIC LAPAROSCOPY; EXPLORATORY LAPAROTOMY; SIGMOID COLECTOMY;  Surgeon: Erroll Luna, MD;  Location: [facility];  Service: General;  Laterality: N/A;   right rotator cuff     SUBMUCOSAL TATTOO INJECTION  10/29/2019   Procedure: SUBMUCOSAL TATTOO INJECTION;  Surgeon: Jerene Bears, MD;  [facility];  Service: Gastroenterology;;   TONSILLECTOMY     UMBILICAL HERNIA REPAIR N/A 09/08/2016   Procedure: OPEN UMBILICAL HERNIA REPAIR WITH MESH;  Surgeon: Jackolyn Confer, MD;  [facility];  Service: General;  Laterality: N/A;   VENTRAL HERNIA REPAIR N/A 02/03/2021   Procedure: LAPAROSCOPIC VENTRAL HERNIA REPAIR WITH MESH;  Surgeon: [name], MD;  Location: [place];  Service: General;  Laterality: N/A;    XI ROBOTIC ASSISTED COLOSTOMY TAKEDOWN N/A 03/11/2020   Procedure: ROBOTIC ASSISTED COLOSTOMY REVERSAL, RIGID PROCTOSCOPY;  Surgeon: Leighton Ruff, MD;  Location: [facility];  Service: General;  Laterality: N/A;    SOCIAL HISTORY: Social History   Socioeconomic History   Marital status: Married    Spouse name: Auggie Kowalke    Number of children: 2   Years of education: 12   Highest education level: Master's degree (e.g., MA, MS, MEng, MEd, MSW, MBA)  Occupational History   Occupation: Retired   Tobacco Use   Smoking status: Never   Smokeless tobacco: Never  Vaping Use   Vaping Use: Never used  Substance and Sexual Activity   Alcohol use: Yes    Alcohol/week: 17.0 standard drinks of alcohol    Types: 3 Glasses of wine, 14 Standard drinks or equivalent per week   Drug use: No   Sexual activity: Not Currently  Other Topics Concern   Not on file  Social History Narrative   Married. 2 children 52 in 80 in 2022- both went to [employer] [facility]. 5 grandkids- all girls from oldest 63 looking premed [employer], 66 soph  [employer], 47 91 year olds, 64 year olds.       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 [employer]: fishing, [employer] football and basketball, [place], [place] Strain: [place]  (05/08/2022)   Overall Financial Resource Strain ([place])    Difficulty of Paying Living Expenses: Not hard at all  Food Insecurity: No [place] (07/12/2022)   Hunger Vital Sign    Worried About Running Out of Food in the Last Year: Never true    [place] in the Last Year: Never true  Transportation Needs: No Transportation Needs (07/12/2022)   PRAPARE - Hydrologist (Medical): No    Lack of Transportation (Non-Medical): No  Physical Activity: Insufficiently Active (05/08/2022)   Exercise Vital  Sign    Days of Exercise per Week: 3 days    Minutes of Exercise per Session: 20 min  Stress: No Stress Concern Present (05/08/2022)   [place]    Feeling of Stress : Only a little  Social Connections: Socially Integrated (05/08/2022)   Social Connection and Isolation Panel [NHANES]    Frequency of Communication with Friends and Family: More than three times a week    Frequency of Social Gatherings with Friends and Family: Once a week    Attends Religious Services: More than 4 times per year    Active Member of [employer] or Organizations: Yes    Attends Music therapist: More than 4 times per year    Marital Status: Married  Human resources officer Violence: Not At Risk (05/08/2022)   Humiliation, Afraid, Rape, and Kick questionnaire    Fear of Current or Ex-Partner: No    Emotionally Abused: No    Physically Abused: No    Sexually Abused: No    FAMILY HISTORY: Family History  Problem Relation Age of Onset   Diabetes Mother    Heart failure Mother        around 54   CAD Father        66   Melanoma Brother 30   Healthy Daughter    Healthy Son    Colon cancer Neg Hx    Esophageal cancer Neg Hx    Rectal cancer Neg Hx    Stomach cancer Neg Hx     ALLERGIES:  is allergic to demerol, promethazine hcl, and ativan [lorazepam].  MEDICATIONS:  Current Outpatient Medications  Medication Sig Dispense Refill   acetaminophen (TYLENOL) 500 MG tablet Take 500 mg by mouth every 6 (six) hours as needed.     acyclovir (ZOVIRAX) 400 MG tablet Take 1 tablet (400 mg total) by mouth in the morning and at bedtime. 60 tablet 2   allopurinol (ZYLOPRIM) 300 MG tablet Take 1 tablet (300 mg total) by mouth daily. 60 tablet 2   aspirin EC 81 MG tablet Take 81 mg by mouth daily. Swallow whole.     atorvastatin (LIPITOR) 80 MG tablet Take 1 tablet (80 mg total) by mouth daily. 90 tablet 3   Cholecalciferol (VITAMIN D3) 25 MCG (1000  UT) CAPS Take 1,000 Units by mouth daily.     dexamethasone (DECADRON) 4 MG tablet Take 5 tablets (20 mg total) by mouth once a week. 20 tablet 3  fexofenadine (ALLEGRA) 180 MG tablet      finasteride (PROSCAR) 5 MG tablet Take 5 mg by mouth daily.     fluticasone (FLONASE) 50 MCG/ACT nasal spray Place into the nose.     Ibuprofen 200 MG CAPS      lenalidomide (REVLIMID) 10 MG capsule Take 1 capsule (10 mg total) by mouth daily. Fanny Dance [id number]   Date Obtained 09/22/22 Take 1 capsule daily for 21 days and then none for 7 days. 21 capsule 0   lisinopril (ZESTRIL) 10 MG tablet TAKE 1 TABLET EVERY DAY 90 tablet 3   loratadine (CLARITIN) 10 MG tablet Take 10 mg by mouth daily.     Magnesium Oxide 400 MG CAPS      melatonin 5 MG TABS      Omega-3 Fatty Acids (FISH OIL) 1200 MG CAPS Take 1,200 mg by mouth daily.     ondansetron (ZOFRAN) 8 MG tablet Take 1 tablet (8 mg total) by mouth every 8 (eight) hours as needed. 30 tablet 0   pantoprazole (PROTONIX) 40 MG tablet Take by mouth.     prochlorperazine (COMPAZINE) 10 MG tablet Take 1 tablet (10 mg total) by mouth every 6 (six) hours as needed for nausea or vomiting. 30 tablet 0   vitamin B-12 (CYANOCOBALAMIN) 1000 MCG tablet Take 1,000 mcg by mouth daily.     No current facility-administered medications for this visit.    REVIEW OF SYSTEMS:   Constitutional: ( - ) fevers, ( - )  chills , ( - ) night sweats Eyes: ( - ) blurriness of vision, ( - ) double vision, ( - ) watery eyes Ears, nose, mouth, throat, and face: ( - ) mucositis, ( - ) sore throat Respiratory: ( - ) cough, ( - ) dyspnea, ( - ) wheezes Cardiovascular: ( - ) palpitation, ( - ) chest discomfort, ( - ) lower extremity swelling Gastrointestinal:  ( - ) nausea, ( - ) heartburn, ( - ) change in bowel habits Skin: ( - ) abnormal skin rashes Lymphatics: ( - ) new lymphadenopathy, ( - ) easy bruising Neurological: ( + ) numbness, ( - ) tingling, ( - ) new  weaknesses Behavioral/Psych: ( - ) mood change, ( - ) new changes  All other systems were reviewed with the patient and are negative.  PHYSICAL EXAMINATION: ECOG PERFORMANCE STATUS: 1 - Symptomatic but completely ambulatory  Vitals:   10/12/22 0938  BP: 122/68  Pulse: 79  Resp: 18  Temp: (!) 97.2 F (36.2 C)  SpO2: 99%      Filed Weights   10/12/22 0938  Weight: 197 lb 9 oz (89.6 kg)       GENERAL: Well-appearing elderly Caucasian male, alert, no distress and comfortable SKIN: skin color, texture, turgor are normal, no rashes or significant lesions EYES: conjunctiva are pink and non-injected, sclera clear LUNGS: clear to auscultation and percussion with normal breathing effort HEART: regular rate & rhythm and no murmurs and no lower extremity edema ABDOMEN: covered wound sites, no erythema or however purulent discharge from 2 of the lesions.  1 new lesion on this visit. Musculoskeletal: no cyanosis of digits and no clubbing  PSYCH: alert & oriented x 3, fluent speech NEURO: no focal motor/sensory deficits  LABORATORY DATA:  I have reviewed the data as listed    Latest Ref Rng & Units 10/12/2022    9:15 AM 09/14/2022    9:44 AM 08/17/2022   11:15 AM  CBC  WBC 4.0 -  10.5 K/uL 4.6  3.7  3.9   Hemoglobin 13.0 - 17.0 g/dL 15.4  14.3  14.0   Hematocrit 39.0 - 52.0 % 44.2  40.0  39.5   Platelets 150 - 400 K/uL 141  160  155        Latest Ref Rng & Units 10/12/2022    9:15 AM 09/14/2022    9:44 AM 08/17/2022   11:15 AM  CMP  Glucose 70 - 99 mg/dL 126  105  115   BUN 8 - 23 mg/dL '15  14  13   '$ Creatinine 0.61 - 1.24 mg/dL 0.83  0.81  0.98   Sodium 135 - 145 mmol/L 139  140  141   Potassium 3.5 - 5.1 mmol/L 3.9  4.0  3.8   Chloride 98 - 111 mmol/L 105  106  104   CO2 22 - 32 mmol/L '25  25  29   '$ Calcium 8.9 - 10.3 mg/dL 8.6  9.1  9.4   Total Protein 6.5 - 8.1 g/dL 6.0  6.1  5.8   Total Bilirubin 0.3 - 1.2 mg/dL 1.0  0.9  0.9   Alkaline Phos 38 - 126 U/L 94  87  87   AST  15 - 41 U/L '22  21  24   '$ ALT 0 - 44 U/L '26  26  31     '$ Lab Results  Component Value Date   MPROTEIN Not Observed 09/14/2022   MPROTEIN Not Observed 08/17/2022   MPROTEIN Not Observed 06/19/2022   Lab Results  Component Value Date   KPAFRELGTCHN 10.6 10/12/2022   KPAFRELGTCHN 11.8 09/14/2022   KPAFRELGTCHN 11.1 08/17/2022   LAMBDASER 8.4 10/12/2022   LAMBDASER 6.6 09/14/2022   LAMBDASER 6.3 08/17/2022   KAPLAMBRATIO 1.26 10/12/2022   KAPLAMBRATIO 1.79 (H) 09/14/2022   KAPLAMBRATIO 1.76 (H) 08/17/2022    RADIOGRAPHIC STUDIES: No results found.  ASSESSMENT & PLAN  Wesley [name] 82 y.o. male with medical history significant for IgG kappa multiple myeloma who presents for a follow up visit.   The patient meets diagnostic criteria by having greater than 60% plasma cells in his bone marrow.  At this time we are in agreement with [facility] in [place], Revlimid, and dexamethasone as the treatment of choice.   Previously we discussed the concept of comanaged care.  Comanaged care is when the patient has a local primary provider who administers local support and therapy while expert advice and treatment recommendations are rendered by a cancer specialist at a large academic center.  In this arrangement we provide local support, labs, treatment, and emergency visits, however the major decisions regarding the course of treatment are decided by a physician at an academic medical center.  The patient voiced his understanding of comanaged care and was agreeable to proceeding forward with this care model.   The regimen of choice for this patient will be Dara, Revlimid, and dexamethasone.  We will start with weekly treatments for the first 2 cycles.  After that for an additional 4 cycles he will be getting it every other week.  After cycle 6 the patient will then continue with monthly treatment.    # IgG Kappa Multiple Myeloma --Patient meets diagnostic criteria for  multiple myeloma based on the presence of 60% plasma cells in the bone marrow --Pretreatment phenotype RBC as well as type and screen ordered  --Cycle 1 Day 1of Dara/Rev/Dex started on 09/30/2021. He started without revlimid which was added on 10/06/2021 (Cycle  1 Day 8)  -- Labs to include weekly CBC, CMP, LDH.  Additionally monthly SPEP and serum free light chains. Plan:  --patient is currently on maintenance revlimid 10 mg PO daily 21 of 28 days.  --Labs today were reviewed and adequate for treatment.  Additionally his creatinine and LFT are WNL.  -- Return to clinic in 4 weeks continued monitoring on maintenance revlimid.   #Macrocytosis #Leukopenia-stable # Thrombocytopenia-resolved --WBC 4.6, hemoglobin 15.4, MCV 108.6, and platelets of 141 --Levels fluctuating with treatment.  #Lethargy 2/2 Zometa:  --Discussed alternative which would be Xgeva q 28 days.  -- continue monthly Xgeva.    # Boils on Abdomen -- Patient underwent I&D and subsequent antibiotic treatment with no resolution.  2 lesions have developed at the site of his prior ostomy.  A new lesion was present on 06/19/2022. --Patient was seen by surgery who ordered CT scan on 01/31/2022 that showed no fluid collection in the anterior abdominal wall. Instructed patient on how to pack wounds.  --No systemic symptoms, okay to continue with systemic chemotherapy. --continue to follow with surgery.   #Neuropathy:  --Stable and does not interfere with grip or balance --Continue to monitor.    #Supportive Care -- chemotherapy education complete  -- port placement not required.  --ASA 81 mg p.o. daily for thromboprophylaxis on Revlimid. -- zofran '8mg'$  q8H PRN and compazine '10mg'$  PO q6H for nausea -- zometa clearance obtained, started on 03/03/2022. Had lethargy with zometa, will be converting to Xgeva.  Continue monthly injections or Xgeva x 2 years.  -- no pain medication required at this time.   No orders of the defined types  were placed in this encounter.  All questions were answered. The patient knows to call the clinic with any problems, questions or concerns.  I have spent a total of 30 minutes minutes of face-to-face and non-face-to-face time, preparing to see the patient, performing a medically appropriate examination, counseling and educating the patient, ordering medications/tests, documenting clinical information in the electronic health record,  and care coordination.   Ledell Peoples, MD Department of Hematology/Oncology Regino Ramirez at [facility] Phone: [phone] Pager: [phone] Email: [email or web address]  10/15/2022 5:28 PM

## 2022-10-13 LAB — KAPPA/LAMBDA LIGHT CHAINS
Kappa free light chain: 10.6 mg/L (ref 3.3–19.4)
Kappa, lambda light chain ratio: 1.26 (ref 0.26–1.65)
Lambda free light chains: 8.4 mg/L (ref 5.7–26.3)

## 2022-10-15 ENCOUNTER — Encounter: Payer: Self-pay | Admitting: Hematology and Oncology

## 2022-10-18 ENCOUNTER — Other Ambulatory Visit: Payer: Self-pay | Admitting: Family Medicine

## 2022-10-18 LAB — MULTIPLE MYELOMA PANEL, SERUM
Albumin SerPl Elph-Mcnc: 3.5 g/dL (ref 2.9–4.4)
Albumin/Glob SerPl: 1.7 (ref 0.7–1.7)
Alpha 1: 0.2 g/dL (ref 0.0–0.4)
Alpha2 Glob SerPl Elph-Mcnc: 0.6 g/dL (ref 0.4–1.0)
B-Globulin SerPl Elph-Mcnc: 0.9 g/dL (ref 0.7–1.3)
Gamma Glob SerPl Elph-Mcnc: 0.4 g/dL (ref 0.4–1.8)
Globulin, Total: 2.1 g/dL — ABNORMAL LOW (ref 2.2–3.9)
IgA: 97 mg/dL (ref 61–437)
IgG (Immunoglobin G), Serum: 380 mg/dL — ABNORMAL LOW (ref 603–1613)
IgM (Immunoglobulin M), Srm: 29 mg/dL (ref 15–143)
Total Protein ELP: 5.6 g/dL — ABNORMAL LOW (ref 6.0–8.5)

## 2022-10-19 ENCOUNTER — Other Ambulatory Visit: Payer: Self-pay | Admitting: *Deleted

## 2022-10-19 MED ORDER — LENALIDOMIDE 10 MG PO CAPS: 10.0000 mg | ORAL_CAPSULE | Freq: Every day | ORAL | 0 refills | Status: DC

## 2022-10-23 ENCOUNTER — Telehealth: Payer: Self-pay

## 2022-10-23 NOTE — Telephone Encounter (Signed)
T/C from pt stating starting on Friday he began having lower right abdominal pain. He was having sharp pains and abdomen was very sore to touch.  By Sunday he was not able to eat but was not having any nausea or vomiting. He has been diagnosed with diverticulitis in the past.  Spoke with pt and his symptoms have improved and he is going to monitor for now. Pt was advised to call if sign and symptoms return and if are worse to go to the ED.  Pt agreed with plan.

## 2022-11-02 ENCOUNTER — Ambulatory Visit (INDEPENDENT_AMBULATORY_CARE_PROVIDER_SITE_OTHER): Payer: Medicare Other | Admitting: Podiatry

## 2022-11-02 DIAGNOSIS — T451X5A Adverse effect of antineoplastic and immunosuppressive drugs, initial encounter: Secondary | ICD-10-CM | POA: Diagnosis not present

## 2022-11-02 DIAGNOSIS — B351 Tinea unguium: Secondary | ICD-10-CM

## 2022-11-02 DIAGNOSIS — M79674 Pain in right toe(s): Secondary | ICD-10-CM

## 2022-11-02 DIAGNOSIS — M79675 Pain in left toe(s): Secondary | ICD-10-CM | POA: Diagnosis not present

## 2022-11-02 DIAGNOSIS — G620 Drug-induced polyneuropathy: Secondary | ICD-10-CM

## 2022-11-02 DIAGNOSIS — L84 Corns and callosities: Secondary | ICD-10-CM

## 2022-11-05 NOTE — Progress Notes (Signed)
  Subjective:  Patient ID: Wesley [name], male    DOB: 09-14-1940,  MRN: [id number]  Chief Complaint  Patient presents with   Nail Problem    Thick painful toenails, 3 month follow up    82 y.o. male presents with the above complaint. History confirmed with patient.  He returns for follow-up on his calluses and painful thickened elongated nails.  Feet are still numb and tingling from his chemotherapy.  Previous debridement to help to alleviate pain and improve function.  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 Left Foot: dystrophic yellowed discolored nail plates with subungual debris and hyperkeratotic lesion distal tip of third toe Right Foot: dystrophic yellowed discolored nail plates with subungual debris and hyperkeratotic lesion distal tip of great toe  Assessment:   1. Pain due to onychomycosis of toenails of both feet   2. Callus of foot   3. Chemotherapy-induced peripheral neuropathy ([place])      Plan:  Patient was evaluated and treated and all questions answered.  Discussed the etiology and treatment options for the condition in detail with the patient. Educated patient on the topical and oral treatment options for mycotic nails. Recommended debridement of the nails today. Sharp and mechanical debridement performed of all painful and mycotic nails today. Nails debrided in length and thickness using a nail nipper to level of comfort. Discussed treatment options including appropriate shoe gear. Follow up as needed for painful nails.  All symptomatic hyperkeratoses were safely debrided with a sterile #15 blade to patient's level of comfort without incident. We discussed preventative and palliative care of these lesions including supportive and accommodative shoegear, padding,  prefabricated and custom molded accommodative orthoses, use of a pumice stone and lotions/creams daily.    Return in about 3 months (around 02/02/2023) for painful thick fungal nails.

## 2022-11-07 ENCOUNTER — Inpatient Hospital Stay: Payer: Medicare Other | Attending: Hematology and Oncology

## 2022-11-07 ENCOUNTER — Inpatient Hospital Stay (HOSPITAL_BASED_OUTPATIENT_CLINIC_OR_DEPARTMENT_OTHER): Payer: Medicare Other | Admitting: Physician Assistant

## 2022-11-07 ENCOUNTER — Inpatient Hospital Stay: Payer: Medicare Other

## 2022-11-07 ENCOUNTER — Ambulatory Visit: Payer: Medicare Other

## 2022-11-07 VITALS — BP 113/64 | HR 79 | Temp 97.7°F | Resp 14 | Wt 195.7 lb

## 2022-11-07 DIAGNOSIS — C9000 Multiple myeloma not having achieved remission: Secondary | ICD-10-CM | POA: Insufficient documentation

## 2022-11-07 DIAGNOSIS — C9001 Multiple myeloma in remission: Secondary | ICD-10-CM | POA: Diagnosis not present

## 2022-11-07 DIAGNOSIS — Z79899 Other long term (current) drug therapy: Secondary | ICD-10-CM | POA: Insufficient documentation

## 2022-11-07 DIAGNOSIS — D649 Anemia, unspecified: Secondary | ICD-10-CM | POA: Insufficient documentation

## 2022-11-07 LAB — CBC WITH DIFFERENTIAL (CANCER CENTER ONLY)
Abs Immature Granulocytes: 0.02 10*3/uL (ref 0.00–0.07)
Basophils Absolute: 0.1 10*3/uL (ref 0.0–0.1)
Basophils Relative: 3 %
Eosinophils Absolute: 0.1 10*3/uL (ref 0.0–0.5)
Eosinophils Relative: 3 %
HCT: 41.4 % (ref 39.0–52.0)
Hemoglobin: 14.3 g/dL (ref 13.0–17.0)
Immature Granulocytes: 1 %
Lymphocytes Relative: 27 %
Lymphs Abs: 1.1 10*3/uL (ref 0.7–4.0)
MCH: 36.7 pg — ABNORMAL HIGH (ref 26.0–34.0)
MCHC: 34.5 g/dL (ref 30.0–36.0)
MCV: 106.2 fL — ABNORMAL HIGH (ref 80.0–100.0)
Monocytes Absolute: 0.4 10*3/uL (ref 0.1–1.0)
Monocytes Relative: 10 %
Neutro Abs: 2.3 10*3/uL (ref 1.7–7.7)
Neutrophils Relative %: 56 %
Platelet Count: 247 10*3/uL (ref 150–400)
RBC: 3.9 MIL/uL — ABNORMAL LOW (ref 4.22–5.81)
RDW: 13.2 % (ref 11.5–15.5)
WBC Count: 4.1 10*3/uL (ref 4.0–10.5)
nRBC: 0 % (ref 0.0–0.2)

## 2022-11-07 LAB — CMP (CANCER CENTER ONLY)
ALT: 28 U/L (ref 0–44)
AST: 22 U/L (ref 15–41)
Albumin: 3.9 g/dL (ref 3.5–5.0)
Alkaline Phosphatase: 80 U/L (ref 38–126)
Anion gap: 5 (ref 5–15)
BUN: 17 mg/dL (ref 8–23)
CO2: 29 mmol/L (ref 22–32)
Calcium: 8.9 mg/dL (ref 8.9–10.3)
Chloride: 105 mmol/L (ref 98–111)
Creatinine: 1.01 mg/dL (ref 0.61–1.24)
GFR, Estimated: >60 mL/min (ref >60)
Glucose, Bld: 113 mg/dL — ABNORMAL HIGH (ref 70–99)
Potassium: 4.6 mmol/L (ref 3.5–5.1)
Sodium: 139 mmol/L (ref 135–145)
Total Bilirubin: 0.9 mg/dL (ref 0.3–1.2)
Total Protein: 5.9 g/dL — ABNORMAL LOW (ref 6.5–8.1)

## 2022-11-07 LAB — LACTATE DEHYDROGENASE: LDH: 128 U/L (ref 98–192)

## 2022-11-07 MED ORDER — DENOSUMAB 120 MG/1.7ML ~~LOC~~ SOLN
120.0000 mg | Freq: Once | SUBCUTANEOUS | Status: AC
  Administered 2022-11-07: 120 mg via SUBCUTANEOUS
  Filled 2022-11-07: qty 1.7

## 2022-11-07 NOTE — Progress Notes (Signed)
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 Further workup showed SPEP M-Spike 4.2 g/dL with IgG-Kappa as monoclonal protein, IgG 5908 mg/dL 09/08/2021: Initial evaluation at [facility]. bone marrow biopsy performed, showed 60% abnormal plasma cells in 60% cellular marrow. 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  09/26/2021: transfer care to Dr. Lorenso Courier at [facility].  09/30/2021: Cycle 1 Day 1 of Dara/Dex 10/06/2021: Cycle 1 Day 8 (added revlimid) 10/28/2021: Cycle 2 Day 1 of Dara/Rev/Dex 11/25/2021: Cycle 3 Day 1 of Dara/Rev/Dex 5/12/023: Cycle 4 Day 1 of Dara/Rev/Dex 01/20/2022: Cycle 5 Day 1 of Dara/Rev/Dex 02/17/2022: Cycle 6 Day 1 of Dara/Rev/Dex 03/17/2022: Cycle 7 Day 1 of Dara/Rev/Dex 04/14/2022: Cycle 8 Day 1 of Dara/Rev/Dex 05/22/2022: Cycle 9 Day 1 of Dara/Rev/Dex 06/19/2022: Cycle 10 Day 1 of Dara/Rev/Dex 07/17/2022: Cycle 11 Day 1 of Dara/Rev/Dex 08/17/2022: start maintenance revlimid 10 mg PO daily 21 of 28 days.   Interval History:  Wesley [name] 82 y.o. male with medical history significant for IgG kappa multiple myeloma who presents for a follow up visit. The patient's last visit was on 10/12/2022. In the interim since the last visit he continued treatment with maintenance revlimid.   On exam today Mr. Schwantz reports that his energy levels are stable although not 100% back to his baseline. He is able to complete his ADLs on  his own. He has a good appetite and denies any weight changes. He denies nausea, vomiting or abdominal pain. His bowel habits are unchanged without recurrent episodes of diarrhea or constipation. He denies easy bruising or signs of active bleeding. He is tolerating Revlimid pills without any side effects. He denies fevers, chills, night sweats, shortness of breath, chest pain or cough. He has no other complaints. Full 10 point ROS is listed below.  Overall he is willing and able to proceed with continued maintenance revlimid treatment at this time.  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)    CAD (coronary artery disease)    Cholelithiasis    Chronic allergic rhinitis    Coronary atherosclerosis of native coronary artery    Fatty liver    GERD (gastroesophageal reflux disease)    HX 72yrs ago, no longer a problem, no meds   High blood pressure    Hypercholesterolemia    Macrocytosis without anemia    Multiple myeloma not having achieved remission ([place]) 09/26/2021   OSA on CPAP    Paraesophageal hernia    Partial bowel obstruction (HCC)    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 PT denies significant findings   CARDIAC CATHETERIZATION  2008   COLOSTOMY N/A 10/31/2019   Procedure: COLOSTOMY;  Surgeon: Erroll Luna, MD;  Location: [facility];  Service: General;  Laterality: N/A;   CYSTOSCOPY W/ URETERAL STENT PLACEMENT Bilateral 10/31/2019   Procedure: CYSTOSCOPY URETERAL STENT PLACEMENT BILATERAL;  Surgeon: Franchot Gallo, MD;  Location: [facility];  Service: Urology;   Laterality: Bilateral;   CYSTOSCOPY WITH STENT PLACEMENT Bilateral 03/11/2020   Procedure: CYSTOSCOPY WITH BILATERAL FIREFLY INJECTION;  Surgeon: Irine Seal, MD;  Location: [facility];  Service: Urology;  Laterality: Bilateral;   EYE SURGERY     BILATERAL CATARACT SURGERY WITH LENS IMPLANTS   FLEXIBLE SIGMOIDOSCOPY N/A 10/29/2019   Procedure: FLEXIBLE SIGMOIDOSCOPY;  Surgeon: Jerene Bears, MD;  Location: [facility];  Service: Gastroenterology;  Laterality: N/A;   INCISIONAL HERNIA REPAIR N/A 10/13/2020   Procedure: OPEN INCISIONAL HERNIA REPAIR WITH MESH;  Surgeon: [name], MD;  Location: [place];  Service: General;  Laterality: N/A;   INSERTION OF MESH N/A 09/08/2016   Procedure: INSERTION OF MESH;  Surgeon: Jackolyn Confer, MD;  Location: [facility];  Service: General;  Laterality: N/A;   IR RADIOLOGIST EVAL & MGMT  11/10/2020   IR RADIOLOGIST EVAL & MGMT  11/25/2020   LAPAROSCOPIC SIGMOID COLECTOMY N/A 10/31/2019   Procedure: DIAGNOSTIC LAPAROSCOPY; EXPLORATORY LAPAROTOMY; SIGMOID COLECTOMY;  Surgeon: Erroll Luna, MD;  Location: [facility];  Service: General;  Laterality: N/A;   right rotator cuff     SUBMUCOSAL TATTOO INJECTION  10/29/2019   Procedure: SUBMUCOSAL TATTOO INJECTION;  Surgeon: Jerene Bears, MD;  Location: [facility];  Service: Gastroenterology;;   TONSILLECTOMY     UMBILICAL HERNIA REPAIR N/A 09/08/2016   Procedure: OPEN UMBILICAL HERNIA REPAIR WITH MESH;  Surgeon: Jackolyn Confer, MD;  Location: [facility];  Service: General;  Laterality: N/A;   VENTRAL HERNIA REPAIR N/A 02/03/2021   Procedure: LAPAROSCOPIC VENTRAL HERNIA REPAIR WITH MESH;  Surgeon: [name], MD;  Location: [place];  Service: General;  Laterality: N/A;   XI ROBOTIC ASSISTED COLOSTOMY TAKEDOWN N/A 03/11/2020   Procedure: ROBOTIC ASSISTED COLOSTOMY REVERSAL, RIGID PROCTOSCOPY;  Surgeon: Leighton Ruff, MD;  Location: [facility];  Service: General;  Laterality: N/A;    SOCIAL HISTORY: Social History    Socioeconomic History   Marital status: Married    Spouse name: Jadis Plate    Number of children: 2   Years of education: 12   Highest education level: Master's degree (e.g., MA, MS, MEng, MEd, MSW, MBA)  Occupational History   Occupation: Retired   Tobacco Use   Smoking status: Never   Smokeless tobacco: Never  Vaping Use   Vaping Use: Never used  Substance and Sexual Activity   Alcohol use: Yes    Alcohol/week: 17.0 standard drinks of alcohol    Types: 3 Glasses of wine, 14 Standard drinks or equivalent per week   Drug use: No   Sexual activity: Not Currently  Other Topics Concern   Not on file  Social History Narrative   Married. 2 children 52 in 48 in 2022- both went to [employer] [facility]. 5 grandkids- all girls from oldest 51 looking premed [employer], 53 soph [place], 62 37 year olds, 44 year olds.       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 [employer]: fishing, [place] football and basketball, [employer], [place]  Financial Resource Strain: Low Risk  (05/08/2022)   Overall Financial Resource Strain (CARDIA)    Difficulty of Paying Living Expenses: Not hard at all  Food Insecurity: No Food Insecurity (07/12/2022)   Hunger Vital Sign    Worried About Running Out of Food in the Last Year: Never true    Ran Out of Food in the Last Year: Never true  Transportation Needs: No Transportation Needs (07/12/2022)   PRAPARE - Hydrologist (Medical): No    Lack of Transportation (Non-Medical): No  Physical Activity: Insufficiently Active (05/08/2022)   Exercise Vital Sign    Days of Exercise per Week: 3 days    Minutes of Exercise per Session: 20 min  Stress: No Stress Concern Present (05/08/2022)   [place]    Feeling of Stress : Only a  little  Social Connections: Socially Integrated (05/08/2022)   Social Connection and Isolation Panel [NHANES]    Frequency of Communication with Friends and Family: More than three times a week    Frequency of Social Gatherings with Friends and Family: Once a week    Attends Religious Services: More than 4 times per year    Active Member of [employer] or Organizations: Yes    Attends Music therapist: More than 4 times per year    Marital Status: Married  Human resources officer Violence: Not At Risk (05/08/2022)   Humiliation, Afraid, Rape, and Kick questionnaire    Fear of Current or Ex-Partner: No    Emotionally Abused: No    Physically Abused: No    Sexually Abused: No    FAMILY HISTORY: Family History  Problem Relation Age of Onset   Diabetes Mother    Heart failure Mother        around 46   CAD Father        24   Melanoma Brother 23   Healthy Daughter    Healthy Son    Colon cancer Neg Hx    Esophageal cancer Neg Hx    Rectal cancer Neg Hx    Stomach cancer Neg Hx     ALLERGIES:  is allergic to demerol, promethazine hcl, and ativan [lorazepam].  MEDICATIONS:  Current Outpatient Medications  Medication Sig Dispense Refill   acetaminophen (TYLENOL) 500 MG tablet Take 500 mg by mouth every 6 (six) hours as needed.     acyclovir (ZOVIRAX) 400 MG tablet Take 1 tablet (400 mg total) by mouth in the morning and at bedtime. 60 tablet 2   allopurinol (ZYLOPRIM) 300 MG tablet Take 1 tablet (300 mg total) by mouth daily. 60 tablet 2   aspirin EC 81 MG tablet Take 81 mg by mouth daily. Swallow whole.     atorvastatin (LIPITOR) 80 MG tablet Take 1 tablet (80 mg total) by mouth daily. 90 tablet 3   Cholecalciferol (VITAMIN D3) 25 MCG (1000 UT) CAPS Take 1,000 Units by mouth daily.     dexamethasone (DECADRON) 4 MG tablet Take 5 tablets (20 mg total) by mouth once a week. 20 tablet 3   fexofenadine (ALLEGRA) 180 MG tablet      finasteride (PROSCAR) 5 MG tablet Take 5 mg by mouth  daily.     fluticasone (FLONASE) 50 MCG/ACT nasal spray Place into the nose.     Ibuprofen 200 MG CAPS      lenalidomide (REVLIMID) 10 MG capsule Take 1 capsule (10 mg total) by mouth daily. [employer] Auth #  [id number]   Date Obtained 10/19/22 Take 1 capsule daily for 21 days and then none for 7 days. 21 capsule 0   lisinopril (ZESTRIL) 10 MG tablet TAKE 1 TABLET (10 MG TOTAL) BY MOUTH DAILY. 90 tablet 3   loratadine (CLARITIN) 10 MG tablet Take 10 mg by mouth daily.     Magnesium Oxide 400 MG CAPS      melatonin 5 MG TABS      Omega-3 Fatty Acids (FISH OIL) 1200 MG CAPS Take 1,200 mg by mouth daily.     ondansetron (ZOFRAN) 8 MG tablet Take 1 tablet (8 mg total) by mouth every 8 (eight) hours as needed. 30 tablet 0   pantoprazole (PROTONIX) 40 MG tablet Take by mouth.     prochlorperazine (COMPAZINE) 10 MG tablet Take 1 tablet (10 mg total) by mouth every 6 (six) hours as needed for nausea or vomiting. 30 tablet 0   vitamin B-12 (CYANOCOBALAMIN) 1000 MCG tablet Take 1,000 mcg by mouth daily.     No current facility-administered medications for this visit.    REVIEW OF SYSTEMS:   Constitutional: ( - ) fevers, ( - )  chills , ( - ) night sweats Eyes: ( - ) blurriness of vision, ( - ) double vision, ( - ) watery eyes Ears, nose, mouth, throat, and face: ( - ) mucositis, ( - ) sore throat Respiratory: ( - ) cough, ( - ) dyspnea, ( - ) wheezes Cardiovascular: ( - ) palpitation, ( - ) chest discomfort, ( - ) lower extremity swelling Gastrointestinal:  ( - ) nausea, ( - ) heartburn, ( - ) change in bowel habits Skin: ( - ) abnormal skin rashes Lymphatics: ( - ) new lymphadenopathy, ( - ) easy bruising Neurological: ( + ) numbness, ( - ) tingling, ( - ) new weaknesses Behavioral/Psych: ( - ) mood change, ( - ) new changes  All other systems were reviewed with the patient and are negative.  PHYSICAL EXAMINATION: ECOG PERFORMANCE STATUS: 0 - Asymptomatic  Vitals:   11/07/22 0944  BP: 113/64   Pulse: 79  Resp: 14  Temp: 97.7 F (36.5 C)  SpO2: 99%    Filed Weights   11/07/22 0944  Weight: 195 lb 11.2 oz (88.8 kg)     GENERAL: Well-appearing elderly Caucasian male, alert, no distress and comfortable SKIN: skin color, texture, turgor are normal, no rashes or significant lesions EYES: conjunctiva are pink and non-injected, sclera clear LUNGS: clear to auscultation and percussion with normal breathing effort HEART: regular rate & rhythm and no murmurs and no lower extremity edema Musculoskeletal: no cyanosis of digits and no clubbing  PSYCH: alert & oriented x 3, fluent speech NEURO: no focal motor/sensory deficits  LABORATORY DATA:  I have reviewed the data as listed    Latest Ref Rng & Units 11/07/2022    9:28 AM 10/12/2022    9:15 AM 09/14/2022    9:44 AM  CBC  WBC 4.0 - 10.5 K/uL 4.1  4.6  3.7   Hemoglobin 13.0 - 17.0 g/dL 14.3  15.4  14.3   Hematocrit 39.0 - 52.0 % 41.4  44.2  40.0   Platelets 150 - 400 K/uL 247  141  160        Latest Ref Rng & Units 11/07/2022    9:28 AM 10/12/2022    9:15 AM 09/14/2022    9:44 AM  CMP  Glucose 70 - 99 mg/dL 113  126  105  BUN 8 - 23 mg/dL 17  15  14    Creatinine 0.61 - 1.24 mg/dL 1.01  0.83  0.81   Sodium 135 - 145 mmol/L 139  139  140   Potassium 3.5 - 5.1 mmol/L 4.6  3.9  4.0   Chloride 98 - 111 mmol/L 105  105  106   CO2 22 - 32 mmol/L 29  25  25    Calcium 8.9 - 10.3 mg/dL 8.9  8.6  9.1   Total Protein 6.5 - 8.1 g/dL 5.9  6.0  6.1   Total Bilirubin 0.3 - 1.2 mg/dL 0.9  1.0  0.9   Alkaline Phos 38 - 126 U/L 80  94  87   AST 15 - 41 U/L 22  22  21    ALT 0 - 44 U/L 28  26  26      Lab Results  Component Value Date   MPROTEIN Not Observed 10/12/2022   MPROTEIN Not Observed 09/14/2022   MPROTEIN Not Observed 08/17/2022   Lab Results  Component Value Date   KPAFRELGTCHN 10.6 10/12/2022   KPAFRELGTCHN 11.8 09/14/2022   KPAFRELGTCHN 11.1 08/17/2022   LAMBDASER 8.4 10/12/2022   LAMBDASER 6.6 09/14/2022    LAMBDASER 6.3 08/17/2022   KAPLAMBRATIO 1.26 10/12/2022   KAPLAMBRATIO 1.79 (H) 09/14/2022   KAPLAMBRATIO 1.76 (H) 08/17/2022    RADIOGRAPHIC STUDIES: No results found.  ASSESSMENT & PLAN KIEN COLOMBO is a 82 y.o. male with medical history significant for IgG kappa multiple myeloma who presents for a follow up visit.     # IgG Kappa Multiple Myeloma --Patient meets diagnostic criteria for multiple myeloma based on the presence of 60% plasma cells in the bone marrow --Pretreatment phenotype RBC as well as type and screen ordered  --Cycle 1 Day 1of Dara/Rev/Dex started on 09/30/2021. He started without revlimid which was added on 10/06/2021 (Cycle 1 Day 8)  --Transitioned to maintenance Revlimid on 08/17/2022.  Plan:  --Currently on maintenance revlimid 10 mg PO daily 21 of 28 days.  --Labs today were reviewed and adequate for treatment.  WBC 4.1, Hgb 14.3, Plt 247K. Creatinine and LFTs normal. Myeloma labs pending today --Most recent myeloma labs from 10/12/2022 did not detect M protein with normal sFLC. -- Return to clinic in 4 weeks continued monitoring on maintenance revlimid.   #Lethargy 2/2 Zometa:  --Discussed alternative which would be Xgeva q 28 days.  -- continue monthly Xgeva.    # Boils on Abdomen -- Patient underwent I&D and subsequent antibiotic treatment with no resolution.  2 lesions have developed at the site of his prior ostomy.  A new lesion was present on 06/19/2022. --Patient was seen by surgery who ordered CT scan on 01/31/2022 that showed no fluid collection in the anterior abdominal wall. Instructed patient on how to pack wounds.  --No systemic symptoms, okay to continue with systemic chemotherapy. --continue to follow with surgery.   #Neuropathy:  --Stable and does not interfere with grip or balance --Continue to monitor.    #Supportive Care -- chemotherapy education complete  -- port placement not required.  --ASA 81 mg p.o. daily for thromboprophylaxis  on Revlimid. -- zofran 8mg  q8H PRN and compazine 10mg  PO q6H for nausea -- zometa clearance obtained, started on 03/03/2022. Had lethargy with zometa, will be converting to Xgeva.  Continue monthly injections or Xgeva x 2 years.  -- no pain medication required at this time.   No orders of the defined types were placed in this encounter.  All questions were answered. The patient knows to call the clinic with any problems, questions or concerns.  I have spent a total of 30 minutes minutes of face-to-face and non-face-to-face time, preparing to see the patient, performing a medically appropriate examination, counseling and educating the patient, ordering medications/tests, documenting clinical information in the electronic health record,  and care coordination.   Dede Query PA-C Dept of Hematology and [place] at [facility] Phone: [phone]   11/07/2022 10:11 AM

## 2022-11-08 LAB — KAPPA/LAMBDA LIGHT CHAINS
Kappa free light chain: 11.7 mg/L (ref 3.3–19.4)
Kappa, lambda light chain ratio: 1.29 (ref 0.26–1.65)
Lambda free light chains: 9.1 mg/L (ref 5.7–26.3)

## 2022-11-10 ENCOUNTER — Telehealth: Payer: Self-pay | Admitting: Hematology and Oncology

## 2022-11-10 NOTE — Telephone Encounter (Signed)
Reached out to schedule per LOS unable to reach patient.

## 2022-11-13 LAB — MULTIPLE MYELOMA PANEL, SERUM
Albumin SerPl Elph-Mcnc: 3.5 g/dL (ref 2.9–4.4)
Albumin/Glob SerPl: 1.8 — ABNORMAL HIGH (ref 0.7–1.7)
Alpha 1: 0.2 g/dL (ref 0.0–0.4)
Alpha2 Glob SerPl Elph-Mcnc: 0.6 g/dL (ref 0.4–1.0)
B-Globulin SerPl Elph-Mcnc: 0.8 g/dL (ref 0.7–1.3)
Gamma Glob SerPl Elph-Mcnc: 0.4 g/dL (ref 0.4–1.8)
Globulin, Total: 2 g/dL — ABNORMAL LOW (ref 2.2–3.9)
IgA: 123 mg/dL (ref 61–437)
IgG (Immunoglobin G), Serum: 430 mg/dL — ABNORMAL LOW (ref 603–1613)
IgM (Immunoglobulin M), Srm: 56 mg/dL (ref 15–143)
Total Protein ELP: 5.5 g/dL — ABNORMAL LOW (ref 6.0–8.5)

## 2022-11-15 ENCOUNTER — Other Ambulatory Visit: Payer: Self-pay | Admitting: *Deleted

## 2022-11-15 DIAGNOSIS — C9000 Multiple myeloma not having achieved remission: Secondary | ICD-10-CM

## 2022-11-15 MED ORDER — LENALIDOMIDE 10 MG PO CAPS: 10.0000 mg | ORAL_CAPSULE | Freq: Every day | ORAL | 0 refills | Status: DC

## 2022-12-04 ENCOUNTER — Other Ambulatory Visit: Payer: Self-pay | Admitting: Physician Assistant

## 2022-12-05 ENCOUNTER — Inpatient Hospital Stay (HOSPITAL_BASED_OUTPATIENT_CLINIC_OR_DEPARTMENT_OTHER): Payer: Medicare Other | Admitting: Hematology and Oncology

## 2022-12-05 ENCOUNTER — Inpatient Hospital Stay: Payer: Medicare Other

## 2022-12-05 ENCOUNTER — Inpatient Hospital Stay: Payer: Medicare Other | Attending: Hematology and Oncology

## 2022-12-05 ENCOUNTER — Other Ambulatory Visit: Payer: Self-pay

## 2022-12-05 VITALS — BP 126/87 | HR 63 | Temp 97.6°F | Resp 18 | Ht 68.0 in | Wt 194.6 lb

## 2022-12-05 DIAGNOSIS — C9000 Multiple myeloma not having achieved remission: Secondary | ICD-10-CM

## 2022-12-05 DIAGNOSIS — C9001 Multiple myeloma in remission: Secondary | ICD-10-CM | POA: Diagnosis not present

## 2022-12-05 DIAGNOSIS — Z79899 Other long term (current) drug therapy: Secondary | ICD-10-CM | POA: Diagnosis not present

## 2022-12-05 LAB — CMP (CANCER CENTER ONLY)
ALT: 21 U/L (ref 0–44)
AST: 17 U/L (ref 15–41)
Albumin: 3.8 g/dL (ref 3.5–5.0)
Alkaline Phosphatase: 86 U/L (ref 38–126)
Anion gap: 7 (ref 5–15)
BUN: 13 mg/dL (ref 8–23)
CO2: 24 mmol/L (ref 22–32)
Calcium: 8.9 mg/dL (ref 8.9–10.3)
Chloride: 108 mmol/L (ref 98–111)
Creatinine: 0.87 mg/dL (ref 0.61–1.24)
GFR, Estimated: >60 mL/min (ref >60)
Glucose, Bld: 108 mg/dL — ABNORMAL HIGH (ref 70–99)
Potassium: 4.1 mmol/L (ref 3.5–5.1)
Sodium: 139 mmol/L (ref 135–145)
Total Bilirubin: 0.7 mg/dL (ref 0.3–1.2)
Total Protein: 6.1 g/dL — ABNORMAL LOW (ref 6.5–8.1)

## 2022-12-05 LAB — CBC WITH DIFFERENTIAL (CANCER CENTER ONLY)
Abs Immature Granulocytes: 0.02 10*3/uL (ref 0.00–0.07)
Basophils Absolute: 0.1 10*3/uL (ref 0.0–0.1)
Basophils Relative: 2 %
Eosinophils Absolute: 0.1 10*3/uL (ref 0.0–0.5)
Eosinophils Relative: 2 %
HCT: 36.8 % — ABNORMAL LOW (ref 39.0–52.0)
Hemoglobin: 13.2 g/dL (ref 13.0–17.0)
Immature Granulocytes: 0 %
Lymphocytes Relative: 23 %
Lymphs Abs: 1 10*3/uL (ref 0.7–4.0)
MCH: 36.1 pg — ABNORMAL HIGH (ref 26.0–34.0)
MCHC: 35.9 g/dL (ref 30.0–36.0)
MCV: 100.5 fL — ABNORMAL HIGH (ref 80.0–100.0)
Monocytes Absolute: 0.5 10*3/uL (ref 0.1–1.0)
Monocytes Relative: 12 %
Neutro Abs: 2.8 10*3/uL (ref 1.7–7.7)
Neutrophils Relative %: 61 %
Platelet Count: 232 10*3/uL (ref 150–400)
RBC: 3.66 MIL/uL — ABNORMAL LOW (ref 4.22–5.81)
RDW: 13.6 % (ref 11.5–15.5)
WBC Count: 4.6 10*3/uL (ref 4.0–10.5)
nRBC: 0 % (ref 0.0–0.2)

## 2022-12-05 LAB — LACTATE DEHYDROGENASE: LDH: 134 U/L (ref 98–192)

## 2022-12-05 MED ORDER — DENOSUMAB 120 MG/1.7ML ~~LOC~~ SOLN
120.0000 mg | Freq: Once | SUBCUTANEOUS | Status: AC
  Administered 2022-12-05: 120 mg via SUBCUTANEOUS
  Filled 2022-12-05: qty 1.7

## 2022-12-05 NOTE — Progress Notes (Signed)
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 Further workup showed SPEP M-Spike 4.2 g/dL with IgG-Kappa as monoclonal protein, IgG 5908 mg/dL 4/54/0981: Initial evaluation at [facility]. bone marrow biopsy performed, showed 60% abnormal plasma cells in 60% cellular marrow. 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  09/26/2021: transfer care to Dr. Leonides Schanz at [facility].  09/30/2021: Cycle 1 Day 1 of Dara/Dex 10/06/2021: Cycle 1 Day 8 (added revlimid) 10/28/2021: Cycle 2 Day 1 of Dara/Rev/Dex 11/25/2021: Cycle 3 Day 1 of Dara/Rev/Dex 5/12/023: Cycle 4 Day 1 of Dara/Rev/Dex 01/20/2022: Cycle 5 Day 1 of Dara/Rev/Dex 02/17/2022: Cycle 6 Day 1 of Dara/Rev/Dex 03/17/2022: Cycle 7 Day 1 of Dara/Rev/Dex 04/14/2022: Cycle 8 Day 1 of Dara/Rev/Dex 05/22/2022: Cycle 9 Day 1 of Dara/Rev/Dex 06/19/2022: Cycle 10 Day 1 of Dara/Rev/Dex 07/17/2022: Cycle 11 Day 1 of Dara/Rev/Dex 08/17/2022: start maintenance revlimid 10 mg PO daily 21 of 28 days.   Interval History:  Wesley [name] 82 y.o. male with medical history significant for IgG kappa multiple myeloma who presents for a follow up visit. The patient's last visit was on 11/07/2022. In the interim since the last visit he continued treatment with maintenance revlimid.   On exam today Wesley [name] reports he has been well overall other than a recent fall.  He has a puppy at his house which unfortunately was  running around his feet and caused him to trip.  He fell and scraped his chin.  He reports he is recovering well from it though he did have black and blue up and down his arm that look like a "bad tattoo".  He does continue to take his aspirin with his Revlimid.  His Revlimid has been arriving faithfully and just restarted his 21-day on cycle.  He reports the medications not causing any trouble.  He does have a little bit of peripheral neuropathy in his right hand.  He also had a recent long trip to the beach but had some difficulty walking on the sand.  His energy levels have been good and his appetite is strong.  He is not having any nausea, vomiting, or diarrhea.  He is tolerating Revlimid pills without any side effects. He denies fevers, chills, night sweats, shortness of breath, chest pain or cough. He has no other complaints. Full 10 point ROS is listed below.  Overall he is willing and able to proceed with continued maintenance revlimid treatment at this time.  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 PT denies significant findings   CARDIAC CATHETERIZATION  2008   COLOSTOMY N/A 10/31/2019   Procedure: COLOSTOMY;  Surgeon: Harriette Bouillon, MD;  Location: [facility];  Service: General;  Laterality: N/A;   CYSTOSCOPY W/ URETERAL STENT PLACEMENT Bilateral 10/31/2019   Procedure: CYSTOSCOPY URETERAL STENT PLACEMENT BILATERAL;  Surgeon: Marcine Matar, MD;  Location: [facility];  Service: Urology;  Laterality: Bilateral;   CYSTOSCOPY WITH STENT PLACEMENT Bilateral 03/11/2020   Procedure: CYSTOSCOPY WITH BILATERAL FIREFLY INJECTION;  Surgeon: Bjorn Pippin, MD;  Location: [facility];  Service: Urology;  Laterality: Bilateral;   EYE SURGERY     BILATERAL CATARACT SURGERY WITH LENS IMPLANTS   FLEXIBLE SIGMOIDOSCOPY N/A 10/29/2019   Procedure: FLEXIBLE SIGMOIDOSCOPY;  Surgeon: Beverley Fiedler, MD;  Location: [facility];  Service: Gastroenterology;  Laterality: N/A;   INCISIONAL HERNIA REPAIR N/A 10/13/2020   Procedure: OPEN INCISIONAL HERNIA REPAIR WITH MESH;  Surgeon: Axel Filler, MD;  Location: [facility] OR;  Service: General;  Laterality: N/A;   INSERTION OF MESH N/A 09/08/2016   Procedure: INSERTION OF MESH;  Surgeon: Avel Peace, MD;  Location: [facility];  Service: General;  Laterality: N/A;   IR RADIOLOGIST EVAL & MGMT  11/10/2020   IR RADIOLOGIST EVAL & MGMT  11/25/2020   LAPAROSCOPIC SIGMOID COLECTOMY N/A 10/31/2019   Procedure: DIAGNOSTIC LAPAROSCOPY; EXPLORATORY LAPAROTOMY; SIGMOID COLECTOMY;  Surgeon: Harriette Bouillon, MD;  Location: [facility];  Service: General;  Laterality: N/A;   right rotator cuff     SUBMUCOSAL TATTOO INJECTION  10/29/2019   Procedure: SUBMUCOSAL TATTOO INJECTION;  Surgeon: Beverley Fiedler, MD;  Location: [facility];  Service: Gastroenterology;;   TONSILLECTOMY     UMBILICAL HERNIA REPAIR N/A 09/08/2016   Procedure: OPEN UMBILICAL HERNIA REPAIR WITH MESH;  Surgeon: Avel Peace, MD;  Location: [facility];  Service: General;  Laterality: N/A;   VENTRAL HERNIA REPAIR  N/A 02/03/2021   Procedure: LAPAROSCOPIC VENTRAL HERNIA REPAIR WITH MESH;  Surgeon: Axel Filler, MD;  Location: [facility] OR;  Service: General;  Laterality: N/A;   XI ROBOTIC ASSISTED COLOSTOMY TAKEDOWN N/A 03/11/2020   Procedure: ROBOTIC ASSISTED COLOSTOMY REVERSAL, RIGID PROCTOSCOPY;  Surgeon: Romie Levee, MD;  Location: [facility];  Service: General;  Laterality: N/A;    SOCIAL HISTORY: Social History   Socioeconomic History   Marital status: Married    Spouse name: Holston Oyama    Number of children: 2   Years of education: 12   Highest education level: Master's degree (e.g., MA, MS, MEng, MEd, MSW, MBA)  Occupational History   Occupation: Retired   Tobacco Use   Smoking status: Never   Smokeless tobacco: Never  Vaping Use   Vaping Use: Never used  Substance and Sexual Activity   Alcohol use: Yes    Alcohol/week: 17.0 standard drinks of alcohol    Types: 3 Glasses of wine, 14 Standard drinks or equivalent per week   Drug use: No   Sexual activity: Not Currently  Other Topics Concern   Not on file  Social History Narrative   Married. 2 children 52 in 48 in 2022- both went to [employer]. 5 grandkids- all girls from oldest 33 looking premed [employer], 34 soph [employer], 2 62 year olds, 10 year olds.       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 [employer]: fishing, [employer] football and basketball, [place] [employer], yardwork   Social Determinants of Health   Financial Resource Strain: Low Risk  (05/08/2022)   Overall Financial Resource Strain (CARDIA)    Difficulty of Paying Living Expenses: Not hard at all  Food Insecurity: No Food Insecurity (07/12/2022)   Hunger Vital Sign    Worried About Running Out of Food in the Last Year: Never true    Ran Out of Food in the Last Year: Never true  Transportation Needs: No Transportation Needs (07/12/2022)   PRAPARE -  Administrator, Civil Service (Medical): No    Lack of Transportation (Non-Medical): No  Physical Activity: Insufficiently Active (05/08/2022)   Exercise Vital Sign    Days of Exercise per Week: 3 days    Minutes of Exercise per Session: 20 min  Stress: No Stress Concern Present (05/08/2022)   [employer] of Occupational Health - Occupational Stress Questionnaire    Feeling of Stress : Only a little  Social Connections: Socially Integrated (05/08/2022)   Social Connection and Isolation Panel [NHANES]    Frequency of Communication with Friends and Family: More than three times a week    Frequency of Social Gatherings with Friends and Family: Once a week    Attends Religious Services: More than 4 times per year    Active Member of [employer] or Organizations: Yes    Attends Engineer, structural: More than 4 times per year    Marital Status: Married  Catering manager Violence: Not At Risk (05/08/2022)   Humiliation, Afraid, Rape, and Kick questionnaire    Fear of Current or Ex-Partner: No    Emotionally Abused: No    Physically Abused: No    Sexually Abused: No    FAMILY HISTORY: Family History  Problem Relation Age of Onset   Diabetes Mother    Heart failure Mother        around 70   CAD Father        26   Melanoma Brother 61   Healthy Daughter    Healthy Son    Colon cancer Neg Hx    Esophageal cancer Neg Hx    Rectal cancer Neg Hx    Stomach cancer Neg Hx     ALLERGIES:  is allergic to demerol, promethazine hcl, and ativan [lorazepam].  MEDICATIONS:  Current Outpatient Medications  Medication Sig Dispense Refill   acetaminophen (TYLENOL) 500 MG tablet Take 500 mg by mouth every 6 (six) hours as needed.     acyclovir (ZOVIRAX) 400 MG tablet Take 1 tablet (400 mg total) by mouth in the morning and at bedtime. 60 tablet 2   allopurinol (ZYLOPRIM) 300 MG tablet Take 1 tablet (300 mg total) by mouth daily. 60 tablet 2   aspirin EC 81 MG tablet Take 81 mg  by mouth daily. Swallow whole.     atorvastatin (LIPITOR) 80 MG tablet Take 1 tablet (80 mg total) by mouth daily. 90 tablet 3   Cholecalciferol (VITAMIN D3) 25 MCG (1000 UT) CAPS Take 1,000  Units by mouth daily.     dexamethasone (DECADRON) 4 MG tablet Take 5 tablets (20 mg total) by mouth once a week. 20 tablet 3   fexofenadine (ALLEGRA) 180 MG tablet      finasteride (PROSCAR) 5 MG tablet Take 5 mg by mouth daily.     fluticasone (FLONASE) 50 MCG/ACT nasal spray Place into the nose.     Ibuprofen 200 MG CAPS      lenalidomide (REVLIMID) 10 MG capsule Take 1 capsule (10 mg total) by mouth daily. Take 1 capsule daily for 21 days and then none for 7 days. 21 capsule 0   lisinopril (ZESTRIL) 10 MG tablet TAKE 1 TABLET (10 MG TOTAL) BY MOUTH DAILY. 90 tablet 3   loratadine (CLARITIN) 10 MG tablet Take 10 mg by mouth daily.     Magnesium Oxide 400 MG CAPS      melatonin 5 MG TABS      Omega-3 Fatty Acids (FISH OIL) 1200 MG CAPS Take 1,200 mg by mouth daily.     ondansetron (ZOFRAN) 8 MG tablet Take 1 tablet (8 mg total) by mouth every 8 (eight) hours as needed. 30 tablet 0   pantoprazole (PROTONIX) 40 MG tablet Take by mouth.     prochlorperazine (COMPAZINE) 10 MG tablet Take 1 tablet (10 mg total) by mouth every 6 (six) hours as needed for nausea or vomiting. 30 tablet 0   vitamin B-12 (CYANOCOBALAMIN) 1000 MCG tablet Take 1,000 mcg by mouth daily.     No current facility-administered medications for this visit.    REVIEW OF SYSTEMS:   Constitutional: ( - ) fevers, ( - )  chills , ( - ) night sweats Eyes: ( - ) blurriness of vision, ( - ) double vision, ( - ) watery eyes Ears, nose, mouth, throat, and face: ( - ) mucositis, ( - ) sore throat Respiratory: ( - ) cough, ( - ) dyspnea, ( - ) wheezes Cardiovascular: ( - ) palpitation, ( - ) chest discomfort, ( - ) lower extremity swelling Gastrointestinal:  ( - ) nausea, ( - ) heartburn, ( - ) change in bowel habits Skin: ( - ) abnormal skin  rashes Lymphatics: ( - ) new lymphadenopathy, ( - ) easy bruising Neurological: ( + ) numbness, ( - ) tingling, ( - ) new weaknesses Behavioral/Psych: ( - ) mood change, ( - ) new changes  All other systems were reviewed with the patient and are negative.  PHYSICAL EXAMINATION: ECOG PERFORMANCE STATUS: 0 - Asymptomatic  There were no vitals filed for this visit.   There were no vitals filed for this visit.    GENERAL: Well-appearing elderly Caucasian male, alert, no distress and comfortable SKIN: skin color, texture, turgor are normal, no rashes or significant lesions EYES: conjunctiva are pink and non-injected, sclera clear LUNGS: clear to auscultation and percussion with normal breathing effort HEART: regular rate & rhythm and no murmurs and no lower extremity edema Musculoskeletal: no cyanosis of digits and no clubbing  PSYCH: alert & oriented x 3, fluent speech NEURO: no focal motor/sensory deficits  LABORATORY DATA:  I have reviewed the data as listed    Latest Ref Rng & Units 11/07/2022    9:28 AM 10/12/2022    9:15 AM 09/14/2022    9:44 AM  CBC  WBC 4.0 - 10.5 K/uL 4.1  4.6  3.7   Hemoglobin 13.0 - 17.0 g/dL 45.4  09.8  11.9   Hematocrit 39.0 - 52.0 %  41.4  44.2  40.0   Platelets 150 - 400 K/uL 247  141  160        Latest Ref Rng & Units 11/07/2022    9:28 AM 10/12/2022    9:15 AM 09/14/2022    9:44 AM  CMP  Glucose 70 - 99 mg/dL 098  119  147   BUN 8 - 23 mg/dL 17  15  14    Creatinine 0.61 - 1.24 mg/dL 8.29  5.62  1.30   Sodium 135 - 145 mmol/L 139  139  140   Potassium 3.5 - 5.1 mmol/L 4.6  3.9  4.0   Chloride 98 - 111 mmol/L 105  105  106   CO2 22 - 32 mmol/L 29  25  25    Calcium 8.9 - 10.3 mg/dL 8.9  8.6  9.1   Total Protein 6.5 - 8.1 g/dL 5.9  6.0  6.1   Total Bilirubin 0.3 - 1.2 mg/dL 0.9  1.0  0.9   Alkaline Phos 38 - 126 U/L 80  94  87   AST 15 - 41 U/L 22  22  21    ALT 0 - 44 U/L 28  26  26      Lab Results  Component Value Date   MPROTEIN Not  Observed 11/07/2022   MPROTEIN Not Observed 10/12/2022   MPROTEIN Not Observed 09/14/2022   Lab Results  Component Value Date   KPAFRELGTCHN 11.7 11/07/2022   KPAFRELGTCHN 10.6 10/12/2022   KPAFRELGTCHN 11.8 09/14/2022   LAMBDASER 9.1 11/07/2022   LAMBDASER 8.4 10/12/2022   LAMBDASER 6.6 09/14/2022   KAPLAMBRATIO 1.29 11/07/2022   KAPLAMBRATIO 1.26 10/12/2022   KAPLAMBRATIO 1.79 (H) 09/14/2022    RADIOGRAPHIC STUDIES: No results found.  ASSESSMENT & PLAN GURKIRAT BASHER is a 82 y.o. male with medical history significant for IgG kappa multiple myeloma who presents for a follow up visit.     # IgG Kappa Multiple Myeloma --Patient meets diagnostic criteria for multiple myeloma based on the presence of 60% plasma cells in the bone marrow --Pretreatment phenotype RBC as well as type and screen ordered  --Cycle 1 Day 1of Dara/Rev/Dex started on 09/30/2021. He started without revlimid which was added on 10/06/2021 (Cycle 1 Day 8)  --Transitioned to maintenance Revlimid on 08/17/2022.  Plan:  --Currently on maintenance revlimid 10 mg PO daily 21 of 28 days.  --Labs today were reviewed and adequate for treatment.  WBC 4.6, hemoglobin 13.2, MCV 100.5, and platelets of 232. Creatinine and LFTs normal. Myeloma labs pending today --Most recent myeloma labs from 11/07/2022 did not detect M protein with normal sFLC. -- Return to clinic in 4 weeks for labs/Xgeva and in 3 months for a repeat clinic visit   #Lethargy 2/2 Zometa:  --Discussed alternative which would be Xgeva q 28 days.  -- continue monthly Xgeva.    # Boils on Abdomen -- Patient underwent I&D and subsequent antibiotic treatment with no resolution.  2 lesions have developed at the site of his prior ostomy.  A new lesion was present on 06/19/2022. --Patient was seen by surgery who ordered CT scan on 01/31/2022 that showed no fluid collection in the anterior abdominal wall. Instructed patient on how to pack wounds.  --No systemic  symptoms, okay to continue with systemic chemotherapy. --continue to follow with surgery.   #Neuropathy:  --Stable and does not interfere with grip or balance --Continue to monitor.    #Supportive Care -- chemotherapy education complete  -- port placement not  required.  --ASA 81 mg p.o. daily for thromboprophylaxis on Revlimid. -- zofran 8mg  q8H PRN and compazine 10mg  PO q6H for nausea -- zometa clearance obtained, started on 03/03/2022. Had lethargy with zometa, will be converting to Xgeva.  Continue monthly injections or Xgeva x 2 years.  -- no pain medication required at this time.   No orders of the defined types were placed in this encounter.  All questions were answered. The patient knows to call the clinic with any problems, questions or concerns.  I have spent a total of 30 minutes minutes of face-to-face and non-face-to-face time, preparing to see the patient, performing a medically appropriate examination, counseling and educating the patient, ordering medications/tests, documenting clinical information in the electronic health record,  and care coordination.   Wesley Barns, MD Department of Hematology/Oncology [facility] Phone: [phone] Pager: [phone] Email: [email or web address]  12/05/2022 9:31 AM

## 2022-12-06 LAB — KAPPA/LAMBDA LIGHT CHAINS
Kappa free light chain: 11.4 mg/L (ref 3.3–19.4)
Kappa, lambda light chain ratio: 1.15 (ref 0.26–1.65)
Lambda free light chains: 9.9 mg/L (ref 5.7–26.3)

## 2022-12-10 LAB — MULTIPLE MYELOMA PANEL, SERUM
Albumin SerPl Elph-Mcnc: 3 g/dL (ref 2.9–4.4)
Albumin/Glob SerPl: 1.4 (ref 0.7–1.7)
Alpha 1: 0.3 g/dL (ref 0.0–0.4)
Alpha2 Glob SerPl Elph-Mcnc: 0.7 g/dL (ref 0.4–1.0)
B-Globulin SerPl Elph-Mcnc: 0.8 g/dL (ref 0.7–1.3)
Gamma Glob SerPl Elph-Mcnc: 0.4 g/dL (ref 0.4–1.8)
Globulin, Total: 2.2 g/dL (ref 2.2–3.9)
IgA: 128 mg/dL (ref 61–437)
IgG (Immunoglobin G), Serum: 415 mg/dL — ABNORMAL LOW (ref 603–1613)
IgM (Immunoglobulin M), Srm: 35 mg/dL (ref 15–143)
Total Protein ELP: 5.2 g/dL — ABNORMAL LOW (ref 6.0–8.5)

## 2022-12-20 ENCOUNTER — Telehealth: Payer: Self-pay | Admitting: *Deleted

## 2022-12-20 NOTE — Telephone Encounter (Signed)
Received vm message from patient . He states the neuropathy in his hands is worsening and needs guidance on treatment/symptom relief.  Please advise.

## 2022-12-21 ENCOUNTER — Other Ambulatory Visit: Payer: Self-pay | Admitting: Physician Assistant

## 2022-12-21 ENCOUNTER — Other Ambulatory Visit: Payer: Self-pay | Admitting: Hematology and Oncology

## 2022-12-21 DIAGNOSIS — C9000 Multiple myeloma not having achieved remission: Secondary | ICD-10-CM

## 2022-12-21 MED ORDER — GABAPENTIN 300 MG PO CAPS: 300.0000 mg | ORAL_CAPSULE | Freq: Every day | ORAL | 0 refills | Status: DC

## 2022-12-22 ENCOUNTER — Other Ambulatory Visit: Payer: Self-pay | Admitting: *Deleted

## 2022-12-22 DIAGNOSIS — C9000 Multiple myeloma not having achieved remission: Secondary | ICD-10-CM

## 2022-12-22 MED ORDER — LENALIDOMIDE 10 MG PO CAPS
10.0000 mg | ORAL_CAPSULE | Freq: Every day | ORAL | 0 refills | Status: DC
Start: 2022-12-22 — End: 2023-01-16

## 2023-01-02 ENCOUNTER — Inpatient Hospital Stay: Payer: Medicare Other

## 2023-01-02 ENCOUNTER — Inpatient Hospital Stay: Payer: Medicare Other | Admitting: Hematology and Oncology

## 2023-01-02 ENCOUNTER — Inpatient Hospital Stay: Payer: Medicare Other | Attending: Hematology and Oncology

## 2023-01-02 VITALS — BP 130/78 | HR 65 | Resp 18

## 2023-01-02 DIAGNOSIS — C9000 Multiple myeloma not having achieved remission: Secondary | ICD-10-CM | POA: Diagnosis not present

## 2023-01-02 DIAGNOSIS — Z79899 Other long term (current) drug therapy: Secondary | ICD-10-CM | POA: Diagnosis not present

## 2023-01-02 LAB — CMP (CANCER CENTER ONLY)
ALT: 22 U/L (ref 0–44)
AST: 20 U/L (ref 15–41)
Albumin: 4.1 g/dL (ref 3.5–5.0)
Alkaline Phosphatase: 72 U/L (ref 38–126)
Anion gap: 7 (ref 5–15)
BUN: 19 mg/dL (ref 8–23)
CO2: 25 mmol/L (ref 22–32)
Calcium: 8.8 mg/dL — ABNORMAL LOW (ref 8.9–10.3)
Chloride: 108 mmol/L (ref 98–111)
Creatinine: 0.89 mg/dL (ref 0.61–1.24)
GFR, Estimated: >60 mL/min (ref >60)
Glucose, Bld: 111 mg/dL — ABNORMAL HIGH (ref 70–99)
Potassium: 4.4 mmol/L (ref 3.5–5.1)
Sodium: 140 mmol/L (ref 135–145)
Total Bilirubin: 0.9 mg/dL (ref 0.3–1.2)
Total Protein: 6.2 g/dL — ABNORMAL LOW (ref 6.5–8.1)

## 2023-01-02 LAB — CBC WITH DIFFERENTIAL (CANCER CENTER ONLY)
Abs Immature Granulocytes: 0.02 10*3/uL (ref 0.00–0.07)
Basophils Absolute: 0.1 10*3/uL (ref 0.0–0.1)
Basophils Relative: 3 %
Eosinophils Absolute: 0.1 10*3/uL (ref 0.0–0.5)
Eosinophils Relative: 2 %
HCT: 41.5 % (ref 39.0–52.0)
Hemoglobin: 14.7 g/dL (ref 13.0–17.0)
Immature Granulocytes: 0 %
Lymphocytes Relative: 24 %
Lymphs Abs: 1.1 10*3/uL (ref 0.7–4.0)
MCH: 36.1 pg — ABNORMAL HIGH (ref 26.0–34.0)
MCHC: 35.4 g/dL (ref 30.0–36.0)
MCV: 102 fL — ABNORMAL HIGH (ref 80.0–100.0)
Monocytes Absolute: 0.6 10*3/uL (ref 0.1–1.0)
Monocytes Relative: 12 %
Neutro Abs: 2.7 10*3/uL (ref 1.7–7.7)
Neutrophils Relative %: 59 %
Platelet Count: 170 10*3/uL (ref 150–400)
RBC: 4.07 MIL/uL — ABNORMAL LOW (ref 4.22–5.81)
RDW: 14.9 % (ref 11.5–15.5)
WBC Count: 4.6 10*3/uL (ref 4.0–10.5)
nRBC: 0 % (ref 0.0–0.2)

## 2023-01-02 LAB — LACTATE DEHYDROGENASE: LDH: 141 U/L (ref 98–192)

## 2023-01-02 MED ORDER — DENOSUMAB 120 MG/1.7ML ~~LOC~~ SOLN
120.0000 mg | Freq: Once | SUBCUTANEOUS | Status: AC
  Administered 2023-01-02: 120 mg via SUBCUTANEOUS

## 2023-01-02 NOTE — Progress Notes (Signed)
Per Dr. Leonides Schanz, ok to give denosumab today with a Calcium of 8.8.    Renaee Munda, PharmD PGY-2 Pharmacy Resident Hematology/Oncology [phone]  01/02/2023 12:21 PM

## 2023-01-02 NOTE — Patient Instructions (Signed)
Denosumab Injection (Oncology) What is this medication? DENOSUMAB (den oh SUE mab) prevents weakened bones caused by cancer. It may also be used to treat noncancerous bone tumors that cannot be removed by surgery. It can also be used to treat high calcium levels in the blood caused by cancer. It works by blocking a protein that causes bones to break down quickly. This slows down the release of calcium from bones, which lowers calcium levels in your blood. It also makes your bones stronger and less likely to break (fracture). This medicine may be used for other purposes; ask your health care provider or pharmacist if you have questions. COMMON BRAND NAME(S): XGEVA What should I tell my care team before I take this medication? They need to know if you have any of these conditions: Dental disease Having surgery or tooth extraction Infection Kidney disease Low levels of calcium or vitamin D in the blood Malnutrition On hemodialysis Skin conditions or sensitivity Thyroid or parathyroid disease An unusual reaction to denosumab, other medications, foods, dyes, or preservatives Pregnant or trying to get pregnant Breast-feeding How should I use this medication? This medication is for injection under the skin. It is given by your care team in a hospital or clinic setting. A special MedGuide will be given to you before each treatment. Be sure to read this information carefully each time. Talk to your care team about the use of this medication in children. While it may be prescribed for children as young as 13 years for selected conditions, precautions do apply. Overdosage: If you think you have taken too much of this medicine contact a poison control center or emergency room at once. NOTE: This medicine is only for you. Do not share this medicine with others. What if I miss a dose? Keep appointments for follow-up doses. It is important not to miss your dose. Call your care team if you are unable to  keep an appointment. What may interact with this medication? Do not take this medication with any of the following: Other medications containing denosumab This medication may also interact with the following: Medications that lower your chance of fighting infection Steroid medications, such as prednisone or cortisone This list may not describe all possible interactions. Give your health care provider a list of all the medicines, herbs, non-prescription drugs, or dietary supplements you use. Also tell them if you smoke, drink alcohol, or use illegal drugs. Some items may interact with your medicine. What should I watch for while using this medication? Your condition will be monitored carefully while you are receiving this medication. You may need blood work while taking this medication. This medication may increase your risk of getting an infection. Call your care team for advice if you get a fever, chills, sore throat, or other symptoms of a cold or flu. Do not treat yourself. Try to avoid being around people who are sick. You should make sure you get enough calcium and vitamin D while you are taking this medication, unless your care team tells you not to. Discuss the foods you eat and the vitamins you take with your care team. Some people who take this medication have severe bone, joint, or muscle pain. This medication may also increase your risk for jaw problems or a broken thigh bone. Tell your care team right away if you have severe pain in your jaw, bones, joints, or muscles. Tell your care team if you have any pain that does not go away or that gets worse. Talk   to your care team if you may be pregnant. Serious birth defects can occur if you take this medication during pregnancy and for 5 months after the last dose. You will need a negative pregnancy test before starting this medication. Contraception is recommended while taking this medication and for 5 months after the last dose. Your care team  can help you find the option that works for you. What side effects may I notice from receiving this medication? Side effects that you should report to your care team as soon as possible: Allergic reactions--skin rash, itching, hives, swelling of the face, lips, tongue, or throat Bone, joint, or muscle pain Low calcium level--muscle pain or cramps, confusion, tingling, or numbness in the hands or feet Osteonecrosis of the jaw--pain, swelling, or redness in the mouth, numbness of the jaw, poor healing after dental work, unusual discharge from the mouth, visible bones in the mouth Side effects that usually do not require medical attention (report to your care team if they continue or are bothersome): Cough Diarrhea Fatigue Headache Nausea This list may not describe all possible side effects. Call your doctor for medical advice about side effects. You may report side effects to FDA at 1-800-FDA-1088. Where should I keep my medication? This medication is given in a hospital or clinic. It will not be stored at home. NOTE: This sheet is a summary. It may not cover all possible information. If you have questions about this medicine, talk to your doctor, pharmacist, or health care provider.  2023 Elsevier/Gold Standard (2021-12-19 00:00:00)  

## 2023-01-02 NOTE — Progress Notes (Signed)
Per MD Leonides Schanz, OK to administer Rivka Barbara today with calcium level 8.8 today

## 2023-01-11 LAB — MULTIPLE MYELOMA PANEL, SERUM
Albumin SerPl Elph-Mcnc: 3.6 g/dL (ref 2.9–4.4)
Albumin/Glob SerPl: 1.6 (ref 0.7–1.7)
Alpha 1: 0.2 g/dL (ref 0.0–0.4)
Alpha2 Glob SerPl Elph-Mcnc: 0.7 g/dL (ref 0.4–1.0)
B-Globulin SerPl Elph-Mcnc: 1 g/dL (ref 0.7–1.3)
Gamma Glob SerPl Elph-Mcnc: 0.5 g/dL (ref 0.4–1.8)
Globulin, Total: 2.4 g/dL (ref 2.2–3.9)
IgA: 170 mg/dL (ref 61–437)
IgG (Immunoglobin G), Serum: 583 mg/dL — ABNORMAL LOW (ref 603–1613)
IgM (Immunoglobulin M), Srm: 32 mg/dL (ref 15–143)
Total Protein ELP: 6 g/dL (ref 6.0–8.5)

## 2023-01-16 ENCOUNTER — Other Ambulatory Visit: Payer: Self-pay | Admitting: *Deleted

## 2023-01-16 DIAGNOSIS — C9000 Multiple myeloma not having achieved remission: Secondary | ICD-10-CM

## 2023-01-16 MED ORDER — LENALIDOMIDE 10 MG PO CAPS
10.0000 mg | ORAL_CAPSULE | Freq: Every day | ORAL | 0 refills | Status: DC
Start: 2023-01-16 — End: 2023-02-05

## 2023-01-18 ENCOUNTER — Telehealth: Payer: Self-pay | Admitting: Hematology and Oncology

## 2023-01-25 ENCOUNTER — Ambulatory Visit (INDEPENDENT_AMBULATORY_CARE_PROVIDER_SITE_OTHER): Payer: Medicare Other | Admitting: Podiatry

## 2023-01-25 DIAGNOSIS — B351 Tinea unguium: Secondary | ICD-10-CM

## 2023-01-25 DIAGNOSIS — M79674 Pain in right toe(s): Secondary | ICD-10-CM | POA: Diagnosis not present

## 2023-01-25 DIAGNOSIS — G620 Drug-induced polyneuropathy: Secondary | ICD-10-CM | POA: Diagnosis not present

## 2023-01-25 DIAGNOSIS — T451X5A Adverse effect of antineoplastic and immunosuppressive drugs, initial encounter: Secondary | ICD-10-CM

## 2023-01-25 DIAGNOSIS — L84 Corns and callosities: Secondary | ICD-10-CM

## 2023-01-25 DIAGNOSIS — M79675 Pain in left toe(s): Secondary | ICD-10-CM | POA: Diagnosis not present

## 2023-01-28 NOTE — Progress Notes (Signed)
  Subjective:  Patient ID: Wesley [name], male    DOB: [birth year],  MRN: [id number]  Chief Complaint  Patient presents with   Nail Problem    Patient came in today for routine foot care nail trim, nails are thick and yellow     82 y.o. male presents with the above complaint. History confirmed with patient.  He returns for follow-up on his calluses and painful thickened elongated nails.  Feet are still numb and has tingling from his chemotherapy, not much change.  Previous debridement has been helpful to alleviate pain and improve function.  Objective:  Physical Exam: warm, good capillary refill, no trophic changes or ulcerative lesions, normal DP and PT pulses, normal sensory exam, and diffuse peripheral neuropathy with loss of protective sensation to [facility] monofilament. Left Foot: dystrophic yellowed discolored nail plates with subungual debris and hyperkeratotic lesion distal tip of third toe Right Foot: dystrophic yellowed discolored nail plates with subungual debris and hyperkeratotic lesion distal tip of great toe  Assessment:   1. Pain due to onychomycosis of toenails of both feet   2. Callus of foot   3. Chemotherapy-induced peripheral neuropathy (HCC)      Plan:  Patient was evaluated and treated and all questions answered.  Discussed the etiology and treatment options for the condition in detail with the patient. Educated patient on the topical and oral treatment options for mycotic nails. Recommended debridement of the nails today. Sharp and mechanical debridement performed of all painful and mycotic nails today. Nails debrided in length and thickness using a nail nipper to level of comfort. Discussed treatment options including appropriate shoe gear. Follow up as needed for painful nails.  All symptomatic hyperkeratoses were safely debrided with a sterile #15 blade to patient's level of comfort without incident. We discussed preventative and palliative care of  these lesions including supportive and accommodative shoegear, padding, prefabricated and custom molded accommodative orthoses, use of a pumice stone and lotions/creams daily.    Return in about 3 months (around 04/27/2023) for painful thick fungal nails.

## 2023-01-30 ENCOUNTER — Inpatient Hospital Stay: Payer: Medicare Other

## 2023-01-30 ENCOUNTER — Inpatient Hospital Stay: Payer: Medicare Other | Attending: Hematology and Oncology

## 2023-01-30 ENCOUNTER — Other Ambulatory Visit: Payer: Self-pay

## 2023-01-30 ENCOUNTER — Inpatient Hospital Stay: Payer: Medicare Other | Admitting: Hematology and Oncology

## 2023-01-30 DIAGNOSIS — Z79899 Other long term (current) drug therapy: Secondary | ICD-10-CM | POA: Insufficient documentation

## 2023-01-30 DIAGNOSIS — C9000 Multiple myeloma not having achieved remission: Secondary | ICD-10-CM | POA: Insufficient documentation

## 2023-01-30 LAB — CMP (CANCER CENTER ONLY)
ALT: 24 U/L (ref 0–44)
AST: 21 U/L (ref 15–41)
Albumin: 3.7 g/dL (ref 3.5–5.0)
Alkaline Phosphatase: 67 U/L (ref 38–126)
Anion gap: 7 (ref 5–15)
BUN: 17 mg/dL (ref 8–23)
CO2: 24 mmol/L (ref 22–32)
Calcium: 8.9 mg/dL (ref 8.9–10.3)
Chloride: 108 mmol/L (ref 98–111)
Creatinine: 0.83 mg/dL (ref 0.61–1.24)
GFR, Estimated: >60 mL/min (ref >60)
Glucose, Bld: 99 mg/dL (ref 70–99)
Potassium: 4.5 mmol/L (ref 3.5–5.1)
Sodium: 139 mmol/L (ref 135–145)
Total Bilirubin: 0.7 mg/dL (ref 0.3–1.2)
Total Protein: 5.6 g/dL — ABNORMAL LOW (ref 6.5–8.1)

## 2023-01-30 LAB — LACTATE DEHYDROGENASE: LDH: 140 U/L (ref 98–192)

## 2023-01-30 LAB — CBC WITH DIFFERENTIAL (CANCER CENTER ONLY)
Abs Immature Granulocytes: 0.01 10*3/uL (ref 0.00–0.07)
Basophils Absolute: 0.1 10*3/uL (ref 0.0–0.1)
Basophils Relative: 1 %
Eosinophils Absolute: 0.1 10*3/uL (ref 0.0–0.5)
Eosinophils Relative: 2 %
HCT: 40.3 % (ref 39.0–52.0)
Hemoglobin: 14 g/dL (ref 13.0–17.0)
Immature Granulocytes: 0 %
Lymphocytes Relative: 22 %
Lymphs Abs: 1.2 10*3/uL (ref 0.7–4.0)
MCH: 35.4 pg — ABNORMAL HIGH (ref 26.0–34.0)
MCHC: 34.7 g/dL (ref 30.0–36.0)
MCV: 101.8 fL — ABNORMAL HIGH (ref 80.0–100.0)
Monocytes Absolute: 0.9 10*3/uL (ref 0.1–1.0)
Monocytes Relative: 16 %
Neutro Abs: 3.1 10*3/uL (ref 1.7–7.7)
Neutrophils Relative %: 59 %
Platelet Count: 185 10*3/uL (ref 150–400)
RBC: 3.96 MIL/uL — ABNORMAL LOW (ref 4.22–5.81)
RDW: 16.2 % — ABNORMAL HIGH (ref 11.5–15.5)
WBC Count: 5.4 10*3/uL (ref 4.0–10.5)
nRBC: 0 % (ref 0.0–0.2)

## 2023-01-31 ENCOUNTER — Inpatient Hospital Stay: Payer: Medicare Other

## 2023-01-31 VITALS — BP 128/75 | HR 75 | Temp 98.7°F | Resp 16

## 2023-01-31 DIAGNOSIS — C9000 Multiple myeloma not having achieved remission: Secondary | ICD-10-CM | POA: Diagnosis not present

## 2023-01-31 DIAGNOSIS — Z79899 Other long term (current) drug therapy: Secondary | ICD-10-CM | POA: Diagnosis not present

## 2023-01-31 LAB — KAPPA/LAMBDA LIGHT CHAINS
Kappa free light chain: 10.8 mg/L (ref 3.3–19.4)
Kappa, lambda light chain ratio: 1.04 (ref 0.26–1.65)
Lambda free light chains: 10.4 mg/L (ref 5.7–26.3)

## 2023-01-31 MED ORDER — DENOSUMAB 120 MG/1.7ML ~~LOC~~ SOLN
120.0000 mg | Freq: Once | SUBCUTANEOUS | Status: AC
  Administered 2023-01-31: 120 mg via SUBCUTANEOUS
  Filled 2023-01-31: qty 1.7

## 2023-02-03 LAB — MULTIPLE MYELOMA PANEL, SERUM
Albumin SerPl Elph-Mcnc: 3.3 g/dL (ref 2.9–4.4)
Albumin/Glob SerPl: 1.5 (ref 0.7–1.7)
Alpha 1: 0.2 g/dL (ref 0.0–0.4)
Alpha2 Glob SerPl Elph-Mcnc: 0.6 g/dL (ref 0.4–1.0)
B-Globulin SerPl Elph-Mcnc: 0.9 g/dL (ref 0.7–1.3)
Gamma Glob SerPl Elph-Mcnc: 0.5 g/dL (ref 0.4–1.8)
Globulin, Total: 2.3 g/dL (ref 2.2–3.9)
IgA: 133 mg/dL (ref 61–437)
IgG (Immunoglobin G), Serum: 490 mg/dL — ABNORMAL LOW (ref 603–1613)
IgM (Immunoglobulin M), Srm: 24 mg/dL (ref 15–143)
Total Protein ELP: 5.6 g/dL — ABNORMAL LOW (ref 6.0–8.5)

## 2023-02-05 ENCOUNTER — Other Ambulatory Visit: Payer: Self-pay | Admitting: *Deleted

## 2023-02-05 DIAGNOSIS — C9000 Multiple myeloma not having achieved remission: Secondary | ICD-10-CM

## 2023-02-05 MED ORDER — LENALIDOMIDE 10 MG PO CAPS
10.0000 mg | ORAL_CAPSULE | Freq: Every day | ORAL | 0 refills | Status: DC
Start: 2023-02-05 — End: 2023-03-12

## 2023-02-11 ENCOUNTER — Other Ambulatory Visit: Payer: Self-pay | Admitting: Hematology and Oncology

## 2023-02-11 DIAGNOSIS — C9000 Multiple myeloma not having achieved remission: Secondary | ICD-10-CM

## 2023-02-26 ENCOUNTER — Other Ambulatory Visit: Payer: Self-pay | Admitting: Physician Assistant

## 2023-02-26 DIAGNOSIS — C9001 Multiple myeloma in remission: Secondary | ICD-10-CM

## 2023-02-27 ENCOUNTER — Other Ambulatory Visit: Payer: Self-pay

## 2023-02-27 ENCOUNTER — Other Ambulatory Visit: Payer: Self-pay | Admitting: Hematology and Oncology

## 2023-02-27 ENCOUNTER — Inpatient Hospital Stay (HOSPITAL_BASED_OUTPATIENT_CLINIC_OR_DEPARTMENT_OTHER): Payer: Medicare Other | Admitting: Physician Assistant

## 2023-02-27 ENCOUNTER — Inpatient Hospital Stay: Payer: Medicare Other

## 2023-02-27 ENCOUNTER — Inpatient Hospital Stay: Payer: Medicare Other | Attending: Hematology and Oncology

## 2023-02-27 VITALS — BP 120/78 | HR 71 | Temp 97.9°F | Resp 17 | Wt 192.5 lb

## 2023-02-27 DIAGNOSIS — C44529 Squamous cell carcinoma of skin of other part of trunk: Secondary | ICD-10-CM | POA: Diagnosis not present

## 2023-02-27 DIAGNOSIS — C9000 Multiple myeloma not having achieved remission: Secondary | ICD-10-CM | POA: Insufficient documentation

## 2023-02-27 DIAGNOSIS — Z85828 Personal history of other malignant neoplasm of skin: Secondary | ICD-10-CM | POA: Diagnosis not present

## 2023-02-27 DIAGNOSIS — D649 Anemia, unspecified: Secondary | ICD-10-CM | POA: Diagnosis not present

## 2023-02-27 DIAGNOSIS — D485 Neoplasm of uncertain behavior of skin: Secondary | ICD-10-CM | POA: Diagnosis not present

## 2023-02-27 DIAGNOSIS — C9001 Multiple myeloma in remission: Secondary | ICD-10-CM | POA: Diagnosis not present

## 2023-02-27 DIAGNOSIS — Z79899 Other long term (current) drug therapy: Secondary | ICD-10-CM | POA: Insufficient documentation

## 2023-02-27 LAB — CBC WITH DIFFERENTIAL (CANCER CENTER ONLY)
Abs Immature Granulocytes: 0.02 10*3/uL (ref 0.00–0.07)
Basophils Absolute: 0.1 10*3/uL (ref 0.0–0.1)
Basophils Relative: 2 %
Eosinophils Absolute: 0.2 10*3/uL (ref 0.0–0.5)
Eosinophils Relative: 4 %
HCT: 40.9 % (ref 39.0–52.0)
Hemoglobin: 14.2 g/dL (ref 13.0–17.0)
Immature Granulocytes: 0 %
Lymphocytes Relative: 24 %
Lymphs Abs: 1.1 10*3/uL (ref 0.7–4.0)
MCH: 36 pg — ABNORMAL HIGH (ref 26.0–34.0)
MCHC: 34.7 g/dL (ref 30.0–36.0)
MCV: 103.8 fL — ABNORMAL HIGH (ref 80.0–100.0)
Monocytes Absolute: 0.5 10*3/uL (ref 0.1–1.0)
Monocytes Relative: 12 %
Neutro Abs: 2.6 10*3/uL (ref 1.7–7.7)
Neutrophils Relative %: 58 %
Platelet Count: 165 10*3/uL (ref 150–400)
RBC: 3.94 MIL/uL — ABNORMAL LOW (ref 4.22–5.81)
RDW: 16.1 % — ABNORMAL HIGH (ref 11.5–15.5)
WBC Count: 4.5 10*3/uL (ref 4.0–10.5)
nRBC: 0 % (ref 0.0–0.2)

## 2023-02-27 LAB — CMP (CANCER CENTER ONLY)
ALT: 25 U/L (ref 0–44)
AST: 21 U/L (ref 15–41)
Albumin: 4 g/dL (ref 3.5–5.0)
Alkaline Phosphatase: 64 U/L (ref 38–126)
Anion gap: 8 (ref 5–15)
BUN: 18 mg/dL (ref 8–23)
CO2: 24 mmol/L (ref 22–32)
Calcium: 9.2 mg/dL (ref 8.9–10.3)
Chloride: 108 mmol/L (ref 98–111)
Creatinine: 0.94 mg/dL (ref 0.61–1.24)
GFR, Estimated: >60 mL/min (ref >60)
Glucose, Bld: 140 mg/dL — ABNORMAL HIGH (ref 70–99)
Potassium: 4.2 mmol/L (ref 3.5–5.1)
Sodium: 140 mmol/L (ref 135–145)
Total Bilirubin: 0.8 mg/dL (ref 0.3–1.2)
Total Protein: 6.1 g/dL — ABNORMAL LOW (ref 6.5–8.1)

## 2023-02-27 LAB — LACTATE DEHYDROGENASE: LDH: 136 U/L (ref 98–192)

## 2023-02-27 MED ORDER — DENOSUMAB 120 MG/1.7ML ~~LOC~~ SOLN
120.0000 mg | Freq: Once | SUBCUTANEOUS | Status: AC
  Administered 2023-02-27: 120 mg via SUBCUTANEOUS
  Filled 2023-02-27: qty 1.7

## 2023-02-27 NOTE — Progress Notes (Signed)
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 Further workup showed SPEP M-Spike 4.2 g/dL with IgG-Kappa as monoclonal protein, IgG 5908 mg/dL 2/53/6644: Initial evaluation at [name]. bone marrow biopsy performed, showed 60% abnormal plasma cells in 60% cellular marrow. 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  09/26/2021: transfer care to Dr. Leonides Schanz at [facility].  09/30/2021: Cycle 1 Day 1 of Dara/Dex 10/06/2021: Cycle 1 Day 8 (added revlimid) 10/28/2021: Cycle 2 Day 1 of Dara/Rev/Dex 11/25/2021: Cycle 3 Day 1 of Dara/Rev/Dex 5/12/023: Cycle 4 Day 1 of Dara/Rev/Dex 01/20/2022: Cycle 5 Day 1 of Dara/Rev/Dex 02/17/2022: Cycle 6 Day 1 of Dara/Rev/Dex 03/17/2022: Cycle 7 Day 1 of Dara/Rev/Dex 04/14/2022: Cycle 8 Day 1 of Dara/Rev/Dex 05/22/2022: Cycle 9 Day 1 of Dara/Rev/Dex 06/19/2022: Cycle 10 Day 1 of Dara/Rev/Dex 07/17/2022: Cycle 11 Day 1 of Dara/Rev/Dex 08/17/2022: start maintenance revlimid 10 mg PO daily 21 of 28 days.   Interval History:  Wesley [name] 82 y.o. male with medical history significant for IgG kappa multiple myeloma who presents for a follow up visit. The patient's last visit was on 12/05/2022. In the interim since the last visit he continued treatment with maintenance revlimid.   On exam today Wesley [name] reports he is tolerating Revlimid and Xgeva therapy without any new or worsening symptoms. 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 He reports his energy levels are fairly stable but he does have some days where he has more fatigue than usual. He continues to complete his ADLs on his own. He denies any appetite or weight changes. He denies nausea, vomiting or bowel habit changes.  He denies fevers, chills, night sweats, shortness of breath, chest pain or cough. He has no other complaints. Full 10 point ROS is listed below.  Overall he is willing and able to proceed with continued maintenance revlimid treatment at this time.  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 PT denies significant  findings   CARDIAC CATHETERIZATION  2008   COLOSTOMY N/A 10/31/2019   Procedure: COLOSTOMY;  Surgeon: Harriette Bouillon, MD;  Location: [facility];  Service: General;  Laterality: N/A;   CYSTOSCOPY W/ URETERAL STENT PLACEMENT Bilateral 10/31/2019   Procedure: CYSTOSCOPY URETERAL STENT PLACEMENT BILATERAL;  Surgeon: Marcine Matar, MD;  Location: [facility];   Service: Urology;  Laterality: Bilateral;   CYSTOSCOPY WITH STENT PLACEMENT Bilateral 03/11/2020   Procedure: CYSTOSCOPY WITH BILATERAL FIREFLY INJECTION;  Surgeon: Bjorn Pippin, MD;  Location: [facility];  Service: Urology;  Laterality: Bilateral;   EYE SURGERY     BILATERAL CATARACT SURGERY WITH LENS IMPLANTS   FLEXIBLE SIGMOIDOSCOPY N/A 10/29/2019   Procedure: FLEXIBLE SIGMOIDOSCOPY;  Surgeon: Beverley Fiedler, MD;  Location: [facility];  Service: Gastroenterology;  Laterality: N/A;   INCISIONAL HERNIA REPAIR N/A 10/13/2020   Procedure: OPEN INCISIONAL HERNIA REPAIR WITH MESH;  Surgeon: Axel Filler, MD;  Location: [facility] OR;  Service: General;  Laterality: N/A;   INSERTION OF MESH N/A 09/08/2016   Procedure: INSERTION OF MESH;  Surgeon: Avel Peace, MD;  Location: [facility];  Service: General;  Laterality: N/A;   IR RADIOLOGIST EVAL & MGMT  11/10/2020   IR RADIOLOGIST EVAL & MGMT  11/25/2020   LAPAROSCOPIC SIGMOID COLECTOMY N/A 10/31/2019   Procedure: DIAGNOSTIC LAPAROSCOPY; EXPLORATORY LAPAROTOMY; SIGMOID COLECTOMY;  Surgeon: Harriette Bouillon, MD;  Location: [facility];  Service: General;  Laterality: N/A;   right rotator cuff     SUBMUCOSAL TATTOO INJECTION  10/29/2019   Procedure: SUBMUCOSAL TATTOO INJECTION;  Surgeon: Beverley Fiedler, MD;  [facility];  Service: Gastroenterology;;   TONSILLECTOMY     UMBILICAL HERNIA REPAIR N/A 09/08/2016   Procedure: OPEN UMBILICAL HERNIA REPAIR WITH MESH;  Surgeon: Avel Peace, MD;  Location: [facility];  Service: General;  Laterality: N/A;   VENTRAL HERNIA REPAIR N/A 02/03/2021   Procedure: LAPAROSCOPIC VENTRAL HERNIA REPAIR WITH MESH;  Surgeon: Axel Filler, MD;  Location[facility];  Service: General;  Laterality: N/A;   XI ROBOTIC ASSISTED COLOSTOMY TAKEDOWN N/A 03/11/2020   Procedure: ROBOTIC ASSISTED COLOSTOMY REVERSAL, RIGID PROCTOSCOPY;  Surgeon: Romie Levee, MD;  Location: [facility];  Service: General;  Laterality: N/A;    SOCIAL HISTORY: Social  History   Socioeconomic History   Marital status: Married    Spouse name: Haidyn Chadderdon    Number of children: 2   Years of education: 12   Highest education level: Master's degree (e.g., MA, MS, MEng, MEd, MSW, MBA)  Occupational History   Occupation: Retired   Tobacco Use   Smoking status: Never   Smokeless tobacco: Never  Vaping Use   Vaping status: Never Used  Substance and Sexual Activity   Alcohol use: Yes    Alcohol/week: 17.0 standard drinks of alcohol    Types: 3 Glasses of wine, 14 Standard drinks or equivalent per week   Drug use: No   Sexual activity: Not Currently  Other Topics Concern   Not on file  Social History Narrative   Married. 2 children 52 in 48 in 2022- both went to [employer] [name]. 5 grandkids- all girls from oldest 84 looking premed [employer], 6 soph [place], 2 64 year olds, 10 year olds.       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 [employer]: fishing, [place] football and basketball, [place], [place]  Social Determinants of Health   Financial Resource Strain: Low Risk  (05/08/2022)   Overall Financial Resource Strain (CARDIA)    Difficulty of Paying Living Expenses: Not hard at all  Food Insecurity: No Food Insecurity (07/12/2022)   Hunger Vital Sign    Worried About Running Out of Food in the Last Year: Never true    Ran Out of Food in the Last Year: Never true  Transportation Needs: No Transportation Needs (07/12/2022)   PRAPARE - Administrator, Civil Service (Medical): No    Lack of Transportation (Non-Medical): No  Physical Activity: Insufficiently Active (05/08/2022)   Exercise Vital Sign    Days of Exercise per Week: 3 days    Minutes of Exercise per Session: 20 min  Stress: No Stress Concern Present (05/08/2022)   [employer] of Occupational Health - Occupational Stress Questionnaire    Feeling of Stress :  Only a little  Social Connections: Socially Integrated (05/08/2022)   Social Connection and Isolation Panel [NHANES]    Frequency of Communication with Friends and Family: More than three times a week    Frequency of Social Gatherings with Friends and Family: Once a week    Attends Religious Services: More than 4 times per year    Active Member of [employer] or Organizations: Yes    Attends Engineer, structural: More than 4 times per year    Marital Status: Married  Catering manager Violence: Not At Risk (05/08/2022)   Humiliation, Afraid, Rape, and Kick questionnaire    Fear of Current or Ex-Partner: No    Emotionally Abused: No    Physically Abused: No    Sexually Abused: No    FAMILY HISTORY: Family History  Problem Relation Age of Onset   Diabetes Mother    Heart failure Mother        around 27   CAD Father        23   Melanoma Brother 17   Healthy Daughter    Healthy Son    Colon cancer Neg Hx    Esophageal cancer Neg Hx    Rectal cancer Neg Hx    Stomach cancer Neg Hx     ALLERGIES:  is allergic to demerol, promethazine hcl, and ativan [lorazepam].  MEDICATIONS:  Current Outpatient Medications  Medication Sig Dispense Refill   acetaminophen (TYLENOL) 500 MG tablet Take 500 mg by mouth every 6 (six) hours as needed.     aspirin EC 81 MG tablet Take 81 mg by mouth daily. Swallow whole.     atorvastatin (LIPITOR) 80 MG tablet Take 1 tablet (80 mg total) by mouth daily. 90 tablet 3   Cholecalciferol (VITAMIN D3) 25 MCG (1000 UT) CAPS Take 1,000 Units by mouth daily.     dexamethasone (DECADRON) 4 MG tablet Take 5 tablets (20 mg total) by mouth once a week. 20 tablet 3   fexofenadine (ALLEGRA) 180 MG tablet      finasteride (PROSCAR) 5 MG tablet Take 5 mg by mouth daily.     fluticasone (FLONASE) 50 MCG/ACT nasal spray Place into the nose.     gabapentin (NEURONTIN) 300 MG capsule Take 1 capsule (300 mg total) by mouth at bedtime. 30 capsule 0   Ibuprofen 200 MG  CAPS      lenalidomide (REVLIMID) 10 MG capsule Take 1 capsule (10 mg total) by mouth daily. Take 1 capsule daily for 21 days and then none for 7 days. 21 capsule 0   lisinopril (  ZESTRIL) 10 MG tablet TAKE 1 TABLET (10 MG TOTAL) BY MOUTH DAILY. 90 tablet 3   loratadine (CLARITIN) 10 MG tablet Take 10 mg by mouth daily.     Magnesium Oxide 400 MG CAPS      melatonin 5 MG TABS      Omega-3 Fatty Acids (FISH OIL) 1200 MG CAPS Take 1,200 mg by mouth daily.     ondansetron (ZOFRAN) 8 MG tablet Take 1 tablet (8 mg total) by mouth every 8 (eight) hours as needed. 30 tablet 0   prochlorperazine (COMPAZINE) 10 MG tablet Take 1 tablet (10 mg total) by mouth every 6 (six) hours as needed for nausea or vomiting. 30 tablet 0   vitamin B-12 (CYANOCOBALAMIN) 1000 MCG tablet Take 1,000 mcg by mouth daily.     acyclovir (ZOVIRAX) 400 MG tablet Take 1 tablet (400 mg total) by mouth in the morning and at bedtime. (Patient not taking: Reported on 12/05/2022) 60 tablet 2   allopurinol (ZYLOPRIM) 300 MG tablet Take 1 tablet (300 mg total) by mouth daily. (Patient not taking: Reported on 12/05/2022) 60 tablet 2   pantoprazole (PROTONIX) 40 MG tablet Take by mouth. (Patient not taking: Reported on 12/05/2022)     No current facility-administered medications for this visit.    REVIEW OF SYSTEMS:   Constitutional: ( - ) fevers, ( - )  chills , ( - ) night sweats Eyes: ( - ) blurriness of vision, ( - ) double vision, ( - ) watery eyes Ears, nose, mouth, throat, and face: ( - ) mucositis, ( - ) sore throat Respiratory: ( - ) cough, ( - ) dyspnea, ( - ) wheezes Cardiovascular: ( - ) palpitation, ( - ) chest discomfort, ( - ) lower extremity swelling Gastrointestinal:  ( - ) nausea, ( - ) heartburn, ( - ) change in bowel habits Skin: ( - ) abnormal skin rashes Lymphatics: ( - ) new lymphadenopathy, ( - ) easy bruising Neurological: ( + ) numbness, ( - ) tingling, ( - ) new weaknesses Behavioral/Psych: ( - ) mood change, (  - ) new changes  All other systems were reviewed with the patient and are negative.  PHYSICAL EXAMINATION: ECOG PERFORMANCE STATUS: 0 - Asymptomatic  Vitals:   02/27/23 1008  BP: 120/78  Pulse: 71  Resp: 17  Temp: 97.9 F (36.6 C)  SpO2: 98%     Filed Weights   02/27/23 1008  Weight: 192 lb 8 oz (87.3 kg)   GENERAL: Well-appearing elderly Caucasian male, alert, no distress and comfortable SKIN: skin color, texture, turgor are normal, no rashes or significant lesions EYES: conjunctiva are pink and non-injected, sclera clear LUNGS: clear to auscultation and percussion with normal breathing effort HEART: regular rate & rhythm and no murmurs and no lower extremity edema Musculoskeletal: no cyanosis of digits and no clubbing  PSYCH: alert & oriented x 3, fluent speech NEURO: no focal motor/sensory deficits  LABORATORY DATA:  I have reviewed the data as listed    Latest Ref Rng & Units 02/27/2023    9:48 AM 01/30/2023   11:04 AM 01/02/2023   11:13 AM  CBC  WBC 4.0 - 10.5 K/uL 4.5  5.4  4.6   Hemoglobin 13.0 - 17.0 g/dL 78.2  95.6  21.3   Hematocrit 39.0 - 52.0 % 40.9  40.3  41.5   Platelets 150 - 400 K/uL 165  185  170        Latest Ref Rng & Units  01/30/2023   11:04 AM 01/02/2023   11:13 AM 12/05/2022   12:51 PM  CMP  Glucose 70 - 99 mg/dL 99  784  696   BUN 8 - 23 mg/dL 17  19  13    Creatinine 0.61 - 1.24 mg/dL 2.95  2.84  1.32   Sodium 135 - 145 mmol/L 139  140  139   Potassium 3.5 - 5.1 mmol/L 4.5  4.4  4.1   Chloride 98 - 111 mmol/L 108  108  108   CO2 22 - 32 mmol/L 24  25  24    Calcium 8.9 - 10.3 mg/dL 8.9  8.8  8.9   Total Protein 6.5 - 8.1 g/dL 5.6  6.2  6.1   Total Bilirubin 0.3 - 1.2 mg/dL 0.7  0.9  0.7   Alkaline Phos 38 - 126 U/L 67  72  86   AST 15 - 41 U/L 21  20  17    ALT 0 - 44 U/L 24  22  21      Lab Results  Component Value Date   MPROTEIN Not Observed 01/30/2023   MPROTEIN Not Observed 01/02/2023   MPROTEIN Not Observed 12/05/2022   Lab  Results  Component Value Date   KPAFRELGTCHN 10.8 01/30/2023   KPAFRELGTCHN 11.4 12/05/2022   KPAFRELGTCHN 11.7 11/07/2022   LAMBDASER 10.4 01/30/2023   LAMBDASER 9.9 12/05/2022   LAMBDASER 9.1 11/07/2022   KAPLAMBRATIO 1.04 01/30/2023   KAPLAMBRATIO 1.15 12/05/2022   KAPLAMBRATIO 1.29 11/07/2022    RADIOGRAPHIC STUDIES: No results found.  ASSESSMENT & PLAN Wesley [name] is a 83 y.o. male with medical history significant for IgG kappa multiple myeloma who presents for a follow up visit.     # IgG Kappa Multiple Myeloma --Patient meets diagnostic criteria for multiple myeloma based on the presence of 60% plasma cells in the bone marrow --Pretreatment phenotype RBC as well as type and screen ordered  --Cycle 1 Day 1of Dara/Rev/Dex started on 09/30/2021. He started without revlimid which was added on 10/06/2021 (Cycle 1 Day 8)  --Transitioned to maintenance Revlimid on 08/17/2022.  Plan:  --Currently on maintenance revlimid 10 mg PO daily 21 of 28 days.  --Labs today were reviewed and adequate for treatment.  WBC 4.5, hemoglobin 14.2, MCV 103.8, and platelets of 165. Creatinine and LFTs normal. Myeloma labs pending today --Most recent myeloma labs from 01/30/2023 did not detect M protein with normal sFLC. -- Return to clinic in 4 weeks for labs/Xgeva and in 3 months for a repeat clinic visit   #Lethargy 2/2 Zometa:  --Discussed alternative which would be Xgeva q 28 days.  -- continue monthly Xgeva.    # Boils on Abdomen -- Patient underwent I&D and subsequent antibiotic treatment with no resolution.  2 lesions have developed at the site of his prior ostomy.  A new lesion was present on 06/19/2022. --Patient was seen by surgery who ordered CT scan on 01/31/2022 that showed no fluid collection in the anterior abdominal wall. Instructed patient on how to pack wounds.  --No systemic symptoms, okay to continue with systemic chemotherapy. --continue to follow with surgery.   # Right hand  neuropathy:  --Suspect carpal tunnel so recommend wearing a wrist brace.   #Supportive Care -- chemotherapy education complete  -- port placement not required.  --ASA 81 mg p.o. daily for thromboprophylaxis on Revlimid. -- zofran 8mg  q8H PRN and compazine 10mg  PO q6H for nausea -- zometa clearance obtained, started on 03/03/2022. Had lethargy with zometa,  will be converting to [place].  Continue monthly injections or Xgeva x 2 years.  -- no pain medication required at this time.   No orders of the defined types were placed in this encounter.  All questions were answered. The patient knows to call the clinic with any problems, questions or concerns.  I have spent a total of 30 minutes minutes of face-to-face and non-face-to-face time, preparing to see the patient, performing a medically appropriate examination, counseling and educating the patient, ordering medications/tests, documenting clinical information in the electronic health record,  and care coordination.   Georga Kaufmann PA-C Dept of Hematology and Oncology [name] Phone: [phone]   02/27/2023 10:28 AM

## 2023-02-28 LAB — KAPPA/LAMBDA LIGHT CHAINS
Kappa free light chain: 12.6 mg/L (ref 3.3–19.4)
Kappa, lambda light chain ratio: 1.15 (ref 0.26–1.65)
Lambda free light chains: 11 mg/L (ref 5.7–26.3)

## 2023-03-04 LAB — MULTIPLE MYELOMA PANEL, SERUM
Albumin SerPl Elph-Mcnc: 3.5 g/dL (ref 2.9–4.4)
Albumin/Glob SerPl: 1.7 (ref 0.7–1.7)
Alpha 1: 0.2 g/dL (ref 0.0–0.4)
Alpha2 Glob SerPl Elph-Mcnc: 0.5 g/dL (ref 0.4–1.0)
B-Globulin SerPl Elph-Mcnc: 0.9 g/dL (ref 0.7–1.3)
Gamma Glob SerPl Elph-Mcnc: 0.5 g/dL (ref 0.4–1.8)
Globulin, Total: 2.1 g/dL — ABNORMAL LOW (ref 2.2–3.9)
IgA: 148 mg/dL (ref 61–437)
IgG (Immunoglobin G), Serum: 521 mg/dL — ABNORMAL LOW (ref 603–1613)
IgM (Immunoglobulin M), Srm: 28 mg/dL (ref 15–143)
Total Protein ELP: 5.6 g/dL — ABNORMAL LOW (ref 6.0–8.5)

## 2023-03-11 ENCOUNTER — Other Ambulatory Visit: Payer: Self-pay | Admitting: Hematology and Oncology

## 2023-03-11 DIAGNOSIS — C9000 Multiple myeloma not having achieved remission: Secondary | ICD-10-CM

## 2023-03-12 ENCOUNTER — Other Ambulatory Visit: Payer: Self-pay | Admitting: *Deleted

## 2023-03-12 DIAGNOSIS — C9000 Multiple myeloma not having achieved remission: Secondary | ICD-10-CM

## 2023-03-12 MED ORDER — LENALIDOMIDE 10 MG PO CAPS: 10.0000 mg | ORAL_CAPSULE | Freq: Every day | ORAL | 0 refills | Status: DC

## 2023-03-27 ENCOUNTER — Inpatient Hospital Stay: Payer: Medicare Other

## 2023-03-27 ENCOUNTER — Inpatient Hospital Stay: Payer: Medicare Other | Admitting: Physician Assistant

## 2023-03-28 ENCOUNTER — Inpatient Hospital Stay: Payer: Medicare Other

## 2023-03-28 ENCOUNTER — Inpatient Hospital Stay: Payer: Medicare Other | Attending: Hematology and Oncology

## 2023-03-28 ENCOUNTER — Telehealth: Payer: Self-pay | Admitting: *Deleted

## 2023-03-28 ENCOUNTER — Other Ambulatory Visit: Payer: Self-pay

## 2023-03-28 VITALS — BP 140/90 | HR 63 | Temp 98.5°F | Resp 18

## 2023-03-28 DIAGNOSIS — C9000 Multiple myeloma not having achieved remission: Secondary | ICD-10-CM | POA: Diagnosis not present

## 2023-03-28 DIAGNOSIS — Z79899 Other long term (current) drug therapy: Secondary | ICD-10-CM | POA: Insufficient documentation

## 2023-03-28 LAB — CBC WITH DIFFERENTIAL (CANCER CENTER ONLY)
Abs Immature Granulocytes: 0.01 10*3/uL (ref 0.00–0.07)
Basophils Absolute: 0.1 10*3/uL (ref 0.0–0.1)
Basophils Relative: 2 %
Eosinophils Absolute: 0.2 10*3/uL (ref 0.0–0.5)
Eosinophils Relative: 4 %
HCT: 41.6 % (ref 39.0–52.0)
Hemoglobin: 14.8 g/dL (ref 13.0–17.0)
Immature Granulocytes: 0 %
Lymphocytes Relative: 28 %
Lymphs Abs: 1.2 10*3/uL (ref 0.7–4.0)
MCH: 36.3 pg — ABNORMAL HIGH (ref 26.0–34.0)
MCHC: 35.6 g/dL (ref 30.0–36.0)
MCV: 102 fL — ABNORMAL HIGH (ref 80.0–100.0)
Monocytes Absolute: 0.5 10*3/uL (ref 0.1–1.0)
Monocytes Relative: 10 %
Neutro Abs: 2.4 10*3/uL (ref 1.7–7.7)
Neutrophils Relative %: 56 %
Platelet Count: 152 10*3/uL (ref 150–400)
RBC: 4.08 MIL/uL — ABNORMAL LOW (ref 4.22–5.81)
RDW: 14.8 % (ref 11.5–15.5)
WBC Count: 4.4 10*3/uL (ref 4.0–10.5)
nRBC: 0 % (ref 0.0–0.2)

## 2023-03-28 LAB — CMP (CANCER CENTER ONLY)
ALT: 23 U/L (ref 0–44)
AST: 21 U/L (ref 15–41)
Albumin: 4 g/dL (ref 3.5–5.0)
Alkaline Phosphatase: 65 U/L (ref 38–126)
Anion gap: 7 (ref 5–15)
BUN: 15 mg/dL (ref 8–23)
CO2: 25 mmol/L (ref 22–32)
Calcium: 8.4 mg/dL — ABNORMAL LOW (ref 8.9–10.3)
Chloride: 109 mmol/L (ref 98–111)
Creatinine: 0.91 mg/dL (ref 0.61–1.24)
GFR, Estimated: >60 mL/min (ref >60)
Glucose, Bld: 145 mg/dL — ABNORMAL HIGH (ref 70–99)
Potassium: 4.1 mmol/L (ref 3.5–5.1)
Sodium: 141 mmol/L (ref 135–145)
Total Bilirubin: 0.8 mg/dL (ref 0.3–1.2)
Total Protein: 6.2 g/dL — ABNORMAL LOW (ref 6.5–8.1)

## 2023-03-28 LAB — LACTATE DEHYDROGENASE: LDH: 135 U/L (ref 98–192)

## 2023-03-28 MED ORDER — DENOSUMAB 120 MG/1.7ML ~~LOC~~ SOLN
120.0000 mg | Freq: Once | SUBCUTANEOUS | Status: AC
  Administered 2023-03-28: 120 mg via SUBCUTANEOUS
  Filled 2023-03-28: qty 1.7

## 2023-03-28 NOTE — Telephone Encounter (Signed)
Received vm message from pt. He states he received his IV Zometa this week. He is wondering if he should be taking any supplemental Calcium to help his bones.  Please advise

## 2023-03-29 LAB — KAPPA/LAMBDA LIGHT CHAINS
Kappa free light chain: 12.5 mg/L (ref 3.3–19.4)
Kappa, lambda light chain ratio: 1.39 (ref 0.26–1.65)
Lambda free light chains: 9 mg/L (ref 5.7–26.3)

## 2023-04-01 LAB — MULTIPLE MYELOMA PANEL, SERUM
Albumin SerPl Elph-Mcnc: 3.8 g/dL (ref 2.9–4.4)
Albumin/Glob SerPl: 1.8 — ABNORMAL HIGH (ref 0.7–1.7)
Alpha 1: 0.2 g/dL (ref 0.0–0.4)
Alpha2 Glob SerPl Elph-Mcnc: 0.6 g/dL (ref 0.4–1.0)
B-Globulin SerPl Elph-Mcnc: 0.9 g/dL (ref 0.7–1.3)
Gamma Glob SerPl Elph-Mcnc: 0.5 g/dL (ref 0.4–1.8)
Globulin, Total: 2.2 g/dL (ref 2.2–3.9)
IgA: 159 mg/dL (ref 61–437)
IgG (Immunoglobin G), Serum: 630 mg/dL (ref 603–1613)
IgM (Immunoglobulin M), Srm: 37 mg/dL (ref 15–143)
Total Protein ELP: 6 g/dL (ref 6.0–8.5)

## 2023-04-02 NOTE — Telephone Encounter (Signed)
TCT patient regarding taking extra calcium. Spoke with him. Advised that Dr. Leonides Schanz recommends 500 mg calcium daily as a supplement. Pt voiced understanding. He is agreeable to this He states he and his wife will be leaving for an [place] cruise on Friday. He wanted to make sure he has all the necessary medications. Wished him an adventure filled trip.

## 2023-04-11 ENCOUNTER — Other Ambulatory Visit: Payer: Self-pay | Admitting: Hematology and Oncology

## 2023-04-11 DIAGNOSIS — C9000 Multiple myeloma not having achieved remission: Secondary | ICD-10-CM

## 2023-04-12 ENCOUNTER — Other Ambulatory Visit: Payer: Self-pay | Admitting: *Deleted

## 2023-04-12 DIAGNOSIS — C9000 Multiple myeloma not having achieved remission: Secondary | ICD-10-CM

## 2023-04-12 MED ORDER — LENALIDOMIDE 10 MG PO CAPS: 10.0000 mg | ORAL_CAPSULE | Freq: Every day | ORAL | 0 refills | Status: DC

## 2023-04-18 ENCOUNTER — Ambulatory Visit (HOSPITAL_COMMUNITY): Payer: Medicare Other

## 2023-04-23 ENCOUNTER — Ambulatory Visit: Payer: Medicare Other | Admitting: Internal Medicine

## 2023-04-24 ENCOUNTER — Ambulatory Visit (HOSPITAL_COMMUNITY): Admission: RE | Admit: 2023-04-24 | Payer: Medicare Other | Source: Ambulatory Visit

## 2023-04-24 ENCOUNTER — Inpatient Hospital Stay: Payer: Medicare Other

## 2023-04-24 ENCOUNTER — Encounter (HOSPITAL_COMMUNITY): Payer: Self-pay

## 2023-04-24 ENCOUNTER — Inpatient Hospital Stay: Payer: Medicare Other | Admitting: Hematology and Oncology

## 2023-04-25 ENCOUNTER — Inpatient Hospital Stay (HOSPITAL_BASED_OUTPATIENT_CLINIC_OR_DEPARTMENT_OTHER): Payer: Medicare Other

## 2023-04-25 ENCOUNTER — Inpatient Hospital Stay: Payer: Medicare Other | Attending: Hematology and Oncology

## 2023-04-25 VITALS — BP 137/78 | HR 65 | Temp 98.3°F | Resp 18

## 2023-04-25 DIAGNOSIS — C9000 Multiple myeloma not having achieved remission: Secondary | ICD-10-CM | POA: Diagnosis not present

## 2023-04-25 DIAGNOSIS — Z79899 Other long term (current) drug therapy: Secondary | ICD-10-CM | POA: Insufficient documentation

## 2023-04-25 LAB — CBC WITH DIFFERENTIAL (CANCER CENTER ONLY)
Abs Immature Granulocytes: 0.02 10*3/uL (ref 0.00–0.07)
Basophils Absolute: 0.1 10*3/uL (ref 0.0–0.1)
Basophils Relative: 3 %
Eosinophils Absolute: 0.1 10*3/uL (ref 0.0–0.5)
Eosinophils Relative: 3 %
HCT: 44.5 % (ref 39.0–52.0)
Hemoglobin: 15.4 g/dL (ref 13.0–17.0)
Immature Granulocytes: 1 %
Lymphocytes Relative: 30 %
Lymphs Abs: 1.3 10*3/uL (ref 0.7–4.0)
MCH: 35.8 pg — ABNORMAL HIGH (ref 26.0–34.0)
MCHC: 34.6 g/dL (ref 30.0–36.0)
MCV: 103.5 fL — ABNORMAL HIGH (ref 80.0–100.0)
Monocytes Absolute: 0.6 10*3/uL (ref 0.1–1.0)
Monocytes Relative: 13 %
Neutro Abs: 2.2 10*3/uL (ref 1.7–7.7)
Neutrophils Relative %: 50 %
Platelet Count: 181 10*3/uL (ref 150–400)
RBC: 4.3 MIL/uL (ref 4.22–5.81)
RDW: 14.4 % (ref 11.5–15.5)
WBC Count: 4.3 10*3/uL (ref 4.0–10.5)
nRBC: 0 % (ref 0.0–0.2)

## 2023-04-25 LAB — CMP (CANCER CENTER ONLY)
ALT: 31 U/L (ref 0–44)
AST: 24 U/L (ref 15–41)
Albumin: 4.1 g/dL (ref 3.5–5.0)
Alkaline Phosphatase: 66 U/L (ref 38–126)
Anion gap: 7 (ref 5–15)
BUN: 15 mg/dL (ref 8–23)
CO2: 24 mmol/L (ref 22–32)
Calcium: 8.8 mg/dL — ABNORMAL LOW (ref 8.9–10.3)
Chloride: 108 mmol/L (ref 98–111)
Creatinine: 0.88 mg/dL (ref 0.61–1.24)
GFR, Estimated: >60 mL/min (ref >60)
Glucose, Bld: 103 mg/dL — ABNORMAL HIGH (ref 70–99)
Potassium: 4.3 mmol/L (ref 3.5–5.1)
Sodium: 139 mmol/L (ref 135–145)
Total Bilirubin: 1 mg/dL (ref 0.3–1.2)
Total Protein: 6.6 g/dL (ref 6.5–8.1)

## 2023-04-25 LAB — LACTATE DEHYDROGENASE: LDH: 150 U/L (ref 98–192)

## 2023-04-25 MED ORDER — DENOSUMAB 120 MG/1.7ML ~~LOC~~ SOLN
120.0000 mg | Freq: Once | SUBCUTANEOUS | Status: AC
  Administered 2023-04-25: 120 mg via SUBCUTANEOUS
  Filled 2023-04-25: qty 1.7

## 2023-04-25 NOTE — Progress Notes (Signed)
Calcium 8.8 today. Okay to proceed with Xgeva injection per Dr. Leonides Schanz.

## 2023-04-26 ENCOUNTER — Telehealth: Payer: Self-pay | Admitting: Hematology and Oncology

## 2023-04-26 ENCOUNTER — Other Ambulatory Visit: Payer: Self-pay | Admitting: *Deleted

## 2023-04-26 DIAGNOSIS — D333 Benign neoplasm of cranial nerves: Secondary | ICD-10-CM

## 2023-04-26 LAB — KAPPA/LAMBDA LIGHT CHAINS
Kappa free light chain: 12.9 mg/L (ref 3.3–19.4)
Kappa, lambda light chain ratio: 1.13 (ref 0.26–1.65)
Lambda free light chains: 11.4 mg/L (ref 5.7–26.3)

## 2023-04-27 ENCOUNTER — Telehealth: Payer: Self-pay | Admitting: Internal Medicine

## 2023-04-27 NOTE — Telephone Encounter (Signed)
Patient called in to reschedule provider visit after the MRI. Appointment has been rescheduled.

## 2023-04-28 ENCOUNTER — Other Ambulatory Visit (HOSPITAL_COMMUNITY): Payer: Medicare Other

## 2023-04-29 LAB — MULTIPLE MYELOMA PANEL, SERUM
Albumin SerPl Elph-Mcnc: 3.6 g/dL (ref 2.9–4.4)
Albumin/Glob SerPl: 1.5 (ref 0.7–1.7)
Alpha 1: 0.2 g/dL (ref 0.0–0.4)
Alpha2 Glob SerPl Elph-Mcnc: 0.6 g/dL (ref 0.4–1.0)
B-Globulin SerPl Elph-Mcnc: 1 g/dL (ref 0.7–1.3)
Gamma Glob SerPl Elph-Mcnc: 0.6 g/dL (ref 0.4–1.8)
Globulin, Total: 2.5 g/dL (ref 2.2–3.9)
IgA: 159 mg/dL (ref 61–437)
IgG (Immunoglobin G), Serum: 664 mg/dL (ref 603–1613)
IgM (Immunoglobulin M), Srm: 33 mg/dL (ref 15–143)
Total Protein ELP: 6.1 g/dL (ref 6.0–8.5)

## 2023-04-30 ENCOUNTER — Inpatient Hospital Stay: Payer: Medicare Other | Admitting: Internal Medicine

## 2023-05-03 ENCOUNTER — Inpatient Hospital Stay: Payer: Medicare Other | Admitting: Internal Medicine

## 2023-05-07 ENCOUNTER — Ambulatory Visit (HOSPITAL_COMMUNITY)
Admission: RE | Admit: 2023-05-07 | Discharge: 2023-05-07 | Disposition: A | Discharge status: Home / Self Care | Payer: Medicare Other | Source: Ambulatory Visit | Attending: Internal Medicine | Admitting: Internal Medicine

## 2023-05-07 ENCOUNTER — Other Ambulatory Visit: Payer: Self-pay | Admitting: Hematology and Oncology

## 2023-05-07 DIAGNOSIS — I6782 Cerebral ischemia: Secondary | ICD-10-CM | POA: Diagnosis not present

## 2023-05-07 DIAGNOSIS — D333 Benign neoplasm of cranial nerves: Secondary | ICD-10-CM | POA: Diagnosis not present

## 2023-05-07 DIAGNOSIS — C9000 Multiple myeloma not having achieved remission: Secondary | ICD-10-CM

## 2023-05-07 DIAGNOSIS — Z8673 Personal history of transient ischemic attack (TIA), and cerebral infarction without residual deficits: Secondary | ICD-10-CM | POA: Diagnosis not present

## 2023-05-07 DIAGNOSIS — R9089 Other abnormal findings on diagnostic imaging of central nervous system: Secondary | ICD-10-CM | POA: Diagnosis not present

## 2023-05-07 MED ORDER — GADOBUTROL 1 MMOL/ML IV SOLN
9.0000 mL | Freq: Once | INTRAVENOUS | Status: AC | PRN
  Administered 2023-05-07: 9 mL via INTRAVENOUS

## 2023-05-08 ENCOUNTER — Other Ambulatory Visit: Payer: Self-pay | Admitting: *Deleted

## 2023-05-08 ENCOUNTER — Telehealth: Payer: Self-pay | Admitting: *Deleted

## 2023-05-08 ENCOUNTER — Ambulatory Visit: Payer: Medicare Other | Admitting: Family Medicine

## 2023-05-08 DIAGNOSIS — C9000 Multiple myeloma not having achieved remission: Secondary | ICD-10-CM

## 2023-05-08 NOTE — Telephone Encounter (Signed)
-----   Message from Ulysees Barns IV sent at 05/08/2023 12:41 PM EDT ----- Regarding: RE: Requests referral to PT - Ok to send referral ----- Message ----- From: Phillis Knack, RN Sent: 05/08/2023   9:40 AM EDT To: Jaci Standard, MD Subject: FW: Requests referral to PT -                  OK to send referral? ----- Message ----- From: Tonny Bollman, RN Sent: 05/07/2023   5:21 PM EDT To: [facility] [address] Subject: Requests referral to PT -                      Hi, Mr. Figeroa LVM Monday at Dr. Derek Mound desk. Please contact him He requested referral to [facility] (ph [phone])  for physical therapy saying  he is trying to improve his strength, balance and stamina.  He said his PCP would not be able to evaluate him until the end of October. I contacted him for more information, but no answer.  Thanks, [employer]

## 2023-05-08 NOTE — Telephone Encounter (Signed)
LM for [facility] to call us back. Stated that patient is asking for a referral to them

## 2023-05-08 NOTE — Telephone Encounter (Signed)
Referral faxed to [facility] , fax [phone]

## 2023-05-09 ENCOUNTER — Ambulatory Visit: Payer: Medicare Other | Admitting: Family Medicine

## 2023-05-10 ENCOUNTER — Other Ambulatory Visit: Payer: Self-pay

## 2023-05-10 DIAGNOSIS — C9000 Multiple myeloma not having achieved remission: Secondary | ICD-10-CM

## 2023-05-10 MED ORDER — LENALIDOMIDE 10 MG PO CAPS: 10.0000 mg | ORAL_CAPSULE | Freq: Every day | ORAL | 0 refills | Status: DC

## 2023-05-14 ENCOUNTER — Inpatient Hospital Stay (HOSPITAL_BASED_OUTPATIENT_CLINIC_OR_DEPARTMENT_OTHER): Payer: Medicare Other | Admitting: Internal Medicine

## 2023-05-14 VITALS — BP 142/77 | HR 87 | Temp 97.9°F | Resp 18 | Wt 194.3 lb

## 2023-05-14 DIAGNOSIS — Z79899 Other long term (current) drug therapy: Secondary | ICD-10-CM | POA: Diagnosis not present

## 2023-05-14 DIAGNOSIS — D333 Benign neoplasm of cranial nerves: Secondary | ICD-10-CM | POA: Diagnosis not present

## 2023-05-14 DIAGNOSIS — C9000 Multiple myeloma not having achieved remission: Secondary | ICD-10-CM | POA: Diagnosis not present

## 2023-05-14 NOTE — Progress Notes (Signed)
Wesley [name] [address]  [place], [place] [address] [phone]   Interval Evaluation  Date of Service: 05/14/23 Patient Name: Wesley [name] Patient MRN: [id number] Patient DOB: 1941/02/21 Provider: Henreitta Leber, MD  Identifying Statement:  Wesley [name] is a 82 y.o. male with  right   vestibular schwannoma    CNS Oncologic History 04/04/21: MRI demonstrates small R vestibular schwannoma after provoked fall  Interval History: Wesley [name] presents today for follow up after recent MRI brain.  No significant headaches.  No changes in hearing issues. Continues on Revlimid with Dr. Leonides Schanz for myeloma.  Prior- No new or progressive neurologic changes described today, although he does describe mild daily headache for past 4-3 months.  He has been dosing ibuprofen 3x per day over that time period.  No changes in his hearing or recurrence of vertigo.  Continues on myeloma rx (dara and revlimid) with Dr. Leonides Schanz.   H+P (04/19/21) Patient presented to medical attention on 8/22 after fall and head trauma, provoked by tripping over his dogs.  He hit the side of his head on some furniture; next day developed vertigo symptoms, CNS imaging subsequently demonstrated a right sided vestibular schwannoma.  Since then, headaches have resolved, but he still experiences "sometimes intense" vertigo upon sitting up or laying down, duration is "seconds to a minute" and it does not persist between episodes.  Otherwise denies double vision, facial weakness or numbness.  Functionally intact and independent at home.  Medications: Current Outpatient Medications on File Prior to Visit  Medication Sig Dispense Refill   acetaminophen (TYLENOL) 500 MG tablet Take 500 mg by mouth every 6 (six) hours as needed.     acyclovir (ZOVIRAX) 400 MG tablet Take 1 tablet (400 mg total) by mouth in the morning and at bedtime. (Patient not taking: Reported on 12/05/2022) 60 tablet  2   allopurinol (ZYLOPRIM) 300 MG tablet Take 1 tablet (300 mg total) by mouth daily. (Patient not taking: Reported on 12/05/2022) 60 tablet 2   aspirin EC 81 MG tablet Take 81 mg by mouth daily. Swallow whole.     atorvastatin (LIPITOR) 80 MG tablet Take 1 tablet (80 mg total) by mouth daily. 90 tablet 3   Cholecalciferol (VITAMIN D3) 25 MCG (1000 UT) CAPS Take 1,000 Units by mouth daily.     dexamethasone (DECADRON) 4 MG tablet Take 5 tablets (20 mg total) by mouth once a week. 20 tablet 3   fexofenadine (ALLEGRA) 180 MG tablet      finasteride (PROSCAR) 5 MG tablet Take 5 mg by mouth daily.     fluticasone (FLONASE) 50 MCG/ACT nasal spray Place into the nose.     gabapentin (NEURONTIN) 300 MG capsule Take 1 capsule (300 mg total) by mouth at bedtime. 30 capsule 0   Ibuprofen 200 MG CAPS      lenalidomide (REVLIMID) 10 MG capsule Take 1 capsule (10 mg total) by mouth daily. Take 1 capsule daily for 21 days and then none for 7 days. 21 capsule 0   lisinopril (ZESTRIL) 10 MG tablet TAKE 1 TABLET (10 MG TOTAL) BY MOUTH DAILY. 90 tablet 3   loratadine (CLARITIN) 10 MG tablet Take 10 mg by mouth daily.     Magnesium Oxide 400 MG CAPS      melatonin 5 MG TABS      Omega-3 Fatty Acids (FISH OIL) 1200 MG CAPS Take 1,200 mg by mouth daily.  ondansetron (ZOFRAN) 8 MG tablet Take 1 tablet (8 mg total) by mouth every 8 (eight) hours as needed. 30 tablet 0   pantoprazole (PROTONIX) 40 MG tablet Take by mouth. (Patient not taking: Reported on 12/05/2022)     prochlorperazine (COMPAZINE) 10 MG tablet Take 1 tablet (10 mg total) by mouth every 6 (six) hours as needed for nausea or vomiting. 30 tablet 0   vitamin B-12 (CYANOCOBALAMIN) 1000 MCG tablet Take 1,000 mcg by mouth daily.     No current facility-administered medications on file prior to visit.    Allergies:  Allergies  Allergen Reactions   Demerol Other (See Comments)    Causes dizziness   Promethazine Hcl Other (See Comments)     Dizziness    Ativan [Lorazepam]     Incontinence per pt   Past Medical History:  Past Medical History:  Diagnosis Date   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 symptoms (LUTS)    CAD (coronary artery disease)    Cholelithiasis    Chronic allergic rhinitis    Coronary atherosclerosis of native coronary artery    Fatty liver    GERD (gastroesophageal reflux disease)    HX 44yrs ago, no longer a problem, no meds   High blood pressure    Hypercholesterolemia    Macrocytosis without anemia    Multiple myeloma not having achieved remission (HCC) 09/26/2021   OSA on CPAP    Paraesophageal hernia    Partial bowel obstruction (HCC)    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 PT denies significant findings   CARDIAC CATHETERIZATION  2008   COLOSTOMY N/A 10/31/2019   Procedure: COLOSTOMY;  Surgeon: Harriette Bouillon, MD;  Location: [facility];  Service: General;  Laterality: N/A;   CYSTOSCOPY W/ URETERAL STENT PLACEMENT Bilateral 10/31/2019   Procedure: CYSTOSCOPY URETERAL STENT PLACEMENT BILATERAL;  Surgeon: Marcine Matar, MD;  Location: [facility];  Service: Urology;  Laterality: Bilateral;   CYSTOSCOPY WITH STENT PLACEMENT Bilateral 03/11/2020   Procedure: CYSTOSCOPY WITH BILATERAL FIREFLY INJECTION;  Surgeon: Bjorn Pippin, MD;  Location: [facility];  Service: Urology;  Laterality: Bilateral;   EYE SURGERY     BILATERAL CATARACT SURGERY WITH LENS IMPLANTS   FLEXIBLE SIGMOIDOSCOPY N/A 10/29/2019   Procedure: FLEXIBLE SIGMOIDOSCOPY;  Surgeon: Beverley Fiedler, MD;  Location: [facility];  Service: Gastroenterology;  Laterality: N/A;   INCISIONAL HERNIA  REPAIR N/A 10/13/2020   Procedure: OPEN INCISIONAL HERNIA REPAIR WITH MESH;  Surgeon: Axel Filler, MD;  Location: [facility] OR;  Service: General;  Laterality: N/A;   INSERTION OF MESH N/A 09/08/2016   Procedure: INSERTION OF MESH;  Surgeon: Avel Peace, MD;  Location: [facility];  Service: General;  Laterality: N/A;   IR RADIOLOGIST EVAL & MGMT  11/10/2020   IR RADIOLOGIST EVAL & MGMT  11/25/2020   LAPAROSCOPIC SIGMOID COLECTOMY N/A 10/31/2019   Procedure: DIAGNOSTIC LAPAROSCOPY; EXPLORATORY LAPAROTOMY; SIGMOID COLECTOMY;  Surgeon: Harriette Bouillon, MD;  Location: [facility];  Service: General;  Laterality: N/A;   right rotator cuff     SUBMUCOSAL TATTOO INJECTION  10/29/2019   Procedure: SUBMUCOSAL TATTOO INJECTION;  Surgeon: Beverley Fiedler, MD;  [facility]: [facility];  Service: Gastroenterology;;   TONSILLECTOMY     UMBILICAL HERNIA REPAIR N/A 09/08/2016   Procedure: OPEN UMBILICAL HERNIA REPAIR WITH MESH;  Surgeon: Avel Peace, MD;  Location: [facility];  Service: General;  Laterality: N/A;   VENTRAL HERNIA REPAIR N/A 02/03/2021   Procedure: LAPAROSCOPIC VENTRAL HERNIA REPAIR WITH MESH;  Surgeon: Axel Filler, MD;  Location: [facility] OR;  Service: General;  Laterality: N/A;   XI ROBOTIC ASSISTED COLOSTOMY TAKEDOWN N/A 03/11/2020   Procedure: ROBOTIC ASSISTED COLOSTOMY REVERSAL, RIGID PROCTOSCOPY;  Surgeon: Romie Levee, MD;  Location: [facility];  Service: General;  Laterality: N/A;   Social History:  Social History   Socioeconomic History   Marital status: Married    Spouse name: Cortney Mccorquodale    Number of children: 2   Years of education: 12   Highest education level: Master's degree (e.g., MA, MS, MEng, MEd, MSW, MBA)  Occupational History   Occupation: Retired   Tobacco Use   Smoking status: Never   Smokeless tobacco: Never  Vaping Use   Vaping status: Never Used  Substance and Sexual Activity   Alcohol use: Yes    Alcohol/week: 17.0 standard drinks of alcohol    Types: 3 Glasses of wine,  14 Standard drinks or equivalent per week   Drug use: No   Sexual activity: Not Currently  Other Topics Concern   Not on file  Social History Narrative   Married. 2 children 52 in 48 in 2022- both went to [employer] [facility]. 5 grandkids- all girls from oldest 66 looking premed [employer], 67 soph [place], 2 81 year olds, 10 year olds.       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 [employer]: fishing, [employer] football and basketball, [employer], yardwork   Social Determinants of Health   Financial Resource Strain: Low Risk  (05/08/2022)   Overall Financial Resource Strain (CARDIA)    Difficulty of Paying Living Expenses: Not hard at all  Food Insecurity: No Food Insecurity (07/12/2022)   Hunger Vital Sign    Worried About Running Out of Food in the Last Year: Never true    Ran Out of Food in the Last Year: Never true  Transportation Needs: No Transportation Needs (07/12/2022)   PRAPARE - Administrator, Civil Service (Medical): No    Lack of Transportation (Non-Medical): No  Physical Activity: Insufficiently Active (05/08/2022)   Exercise Vital Sign    Days of Exercise per Week: 3 days    Minutes of Exercise per Session: 20 min  Stress: No Stress Concern Present (05/08/2022)   [employer] of Occupational Health - Occupational Stress Questionnaire    Feeling of Stress : Only a little  Social Connections: Socially Integrated (05/08/2022)   Social Connection and Isolation Panel [NHANES]    Frequency of Communication with Friends and Family: More than three times a week    Frequency of Social Gatherings with Friends and Family: Once a week    Attends Religious Services: More than 4 times per year    Active Member of [employer] or Organizations: Yes    Attends Banker Meetings: More than 4 times per year    Marital Status: Married  Catering manager Violence: Not  At Risk (05/08/2022)   Humiliation, Afraid, Rape, and Kick questionnaire    Fear of Current or Ex-Partner: No  Emotionally Abused: No    Physically Abused: No    Sexually Abused: No   Family History:  Family History  Problem Relation Age of Onset   Diabetes Mother    Heart failure Mother        around 56   CAD Father        50   Melanoma Brother 39   Healthy Daughter    Healthy Son    Colon cancer Neg Hx    Esophageal cancer Neg Hx    Rectal cancer Neg Hx    Stomach cancer Neg Hx     Review of Systems: Constitutional: Doesn't report fevers, chills or abnormal weight loss Eyes: Doesn't report blurriness of vision Ears, nose, mouth, throat, and face: Doesn't report sore throat Respiratory: Doesn't report cough, dyspnea or wheezes Cardiovascular: Doesn't report palpitation, chest discomfort  Gastrointestinal:  Doesn't report nausea, constipation, diarrhea GU: Doesn't report incontinence Skin: Doesn't report skin rashes Neurological: Per HPI Musculoskeletal: Doesn't report joint pain Behavioral/Psych: Doesn't report anxiety  Physical Exam: Vitals:   05/14/23 0930  BP: (!) 142/77  Pulse: 87  Resp: 18  Temp: 97.9 F (36.6 C)  SpO2: 98%     KPS: 90. General: Alert, cooperative, pleasant, in no acute distress Head: Normal EENT: No conjunctival injection or scleral icterus.  Lungs: Resp effort normal Cardiac: Regular rate Abdomen: Non-distended abdomen Skin: No rashes cyanosis or petechiae. Extremities: No clubbing or edema  Neurologic Exam: Mental Status: Awake, alert, attentive to examiner. Oriented to self and environment. Language is fluent with intact comprehension.  Cranial Nerves: Visual acuity is grossly normal. Visual fields are full. Extra-ocular movements intact. No ptosis. Face is symmetric Motor: Tone and bulk are normal. Power is full in both arms and legs. Reflexes are symmetric, no pathologic reflexes present.  Sensory: Intact to light  touch Gait: Normal.   Labs: I have reviewed the data as listed    Component Value Date/Time   NA 139 04/25/2023 1034   K 4.3 04/25/2023 1034   CL 108 04/25/2023 1034   CO2 24 04/25/2023 1034   GLUCOSE 103 (H) 04/25/2023 1034   BUN 15 04/25/2023 1034   CREATININE 0.88 04/25/2023 1034   CALCIUM 8.8 (L) 04/25/2023 1034   PROT 6.6 04/25/2023 1034   PROT 10.4 (HH) 08/01/2021 1117   ALBUMIN 4.1 04/25/2023 1034   AST 24 04/25/2023 1034   ALT 31 04/25/2023 1034   ALKPHOS 66 04/25/2023 1034   BILITOT 1.0 04/25/2023 1034   GFRNONAA >60 04/25/2023 1034   GFRAA >60 03/14/2020 0553   Lab Results  Component Value Date   WBC 4.3 04/25/2023   NEUTROABS 2.2 04/25/2023   HGB 15.4 04/25/2023   HCT 44.5 04/25/2023   MCV 103.5 (H) 04/25/2023   PLT 181 04/25/2023   Audiology: 06/27/21 The hearing evaluation revealed 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 In the right ear, mild loss sloped to severe loss with the exception of WNL hearing sensitivity for 1000 and 2000 Hz. Slight mixed component in the low frequencies. In the left ear, thresholds were WNL from 250- 3000 Hz, then mild to moderate loss for 4000, 6000, and 8000 Hz. Results were obtained using headphones then inserts.  Imaging:  [facility] Clinician Interpretation: I have personally reviewed the CNS images as listed.  My interpretation, in the context of the patient's clinical presentation, is stable disease pending official read  No results found.  Assessment/Plan Vestibular schwannoma (HCC)  KRON MARKUNAS is clinically stable with  regards to hearing, vestibular function.  MRI brain demonstrates stable findings over 1 year, pending official read, though increase in size of tumor is appreciated when comparing to initial 2022 study.  We again recommended continued imaging surveillance, with repeat MRI brain in 12 months.  He will con't to follow up with regular audiology, ENT, as well as Dr. Leonides Schanz for myeloma  treatments.    All questions were answered. The patient knows to call the clinic with any problems, questions or concerns. No barriers to learning were detected.  The total time spent in the encounter was 40 minutes and more than 50% was on counseling and review of test results   Henreitta Leber, MD Medical Director of Neuro-Oncology [facility] 05/14/23 9:28 AM

## 2023-05-15 ENCOUNTER — Telehealth: Payer: Self-pay

## 2023-05-15 NOTE — Telephone Encounter (Signed)
Referral sent to [facility], fax #[phone].

## 2023-05-15 NOTE — Telephone Encounter (Signed)
Pt called to check on status of PT referral. He states that he called the [facility] office and they stated that they have not yet received fax- this RN stated that it seems the referral has been faxed and is pending on our end but will resend the referral. Pt stated understanding.

## 2023-05-16 ENCOUNTER — Other Ambulatory Visit: Payer: Self-pay | Admitting: Cardiology

## 2023-05-21 DIAGNOSIS — T8189XS Other complications of procedures, not elsewhere classified, sequela: Secondary | ICD-10-CM | POA: Diagnosis not present

## 2023-05-21 DIAGNOSIS — M795 Residual foreign body in soft tissue: Secondary | ICD-10-CM | POA: Diagnosis not present

## 2023-05-22 ENCOUNTER — Encounter: Payer: Self-pay | Admitting: Podiatry

## 2023-05-22 ENCOUNTER — Ambulatory Visit (INDEPENDENT_AMBULATORY_CARE_PROVIDER_SITE_OTHER): Payer: Medicare Other | Admitting: Podiatry

## 2023-05-22 DIAGNOSIS — M79675 Pain in left toe(s): Secondary | ICD-10-CM | POA: Diagnosis not present

## 2023-05-22 DIAGNOSIS — M79674 Pain in right toe(s): Secondary | ICD-10-CM | POA: Diagnosis not present

## 2023-05-22 DIAGNOSIS — B351 Tinea unguium: Secondary | ICD-10-CM | POA: Diagnosis not present

## 2023-05-22 NOTE — Progress Notes (Signed)
This patient presents to the office with chief complaint of long thick painful nails.  Patient says the nails are painful walking and wearing shoes.  This patient is unable to self treat.  This patient is unable to trim his nails since he is unable to reach his nails.  he presents to the office for preventative foot care services.  General Appearance  Alert, conversant and in no acute stress.  Vascular  Dorsalis pedis and posterior tibial  pulses are palpable  bilaterally.  Capillary return is within normal limits  bilaterally. Temperature is within normal limits  bilaterally.  Neurologic  Senn-Weinstein monofilament wire test within normal limits  bilaterally. Muscle power within normal limits bilaterally.  Nails Thick disfigured discolored nails with subungual debris  from hallux to fifth toes bilaterally. No evidence of bacterial infection or drainage bilaterally.  Orthopedic  No limitations of motion  feet .  No crepitus or effusions noted.  No bony pathology or digital deformities noted.  Hammer toes  B/L 2-5  Skin  normotropic skin with no porokeratosis noted bilaterally.  No signs of infections or ulcers noted.   Asymptomatic clavi  B/L.  Onychomycosis  Nails  B/L.  Pain in right toes  Pain in left toes  Debridement of nails both feet followed trimming the nails with dremel tool.    RTC 3 months.   Helane Gunther DPM

## 2023-05-22 NOTE — Progress Notes (Unsigned)
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 Further workup showed SPEP M-Spike 4.2 g/dL with IgG-Kappa as monoclonal protein, IgG 5908 mg/dL 08/22/3233: Initial evaluation at [facility]. bone marrow biopsy performed, showed 60% abnormal plasma cells in 60% cellular marrow. Labs showed kappa 35.28 mg/dL, lambda 5.73 mg/dL, K/L ratio 22.02 and M-Spike 4.48 g/dL, IgG kappa monoclonal gammopathy 09/15/2021: CT skeletal survey showed no CT evidence of aggressive osseous lesions.  09/26/2021: transfer care to Dr. Leonides Schanz at [facility].  09/30/2021: Cycle 1 Day 1 of Dara/Dex 10/06/2021: Cycle 1 Day 8 (added revlimid) 10/28/2021: Cycle 2 Day 1 of Dara/Rev/Dex 11/25/2021: Cycle 3 Day 1 of Dara/Rev/Dex 5/12/023: Cycle 4 Day 1 of Dara/Rev/Dex 01/20/2022: Cycle 5 Day 1 of Dara/Rev/Dex 02/17/2022: Cycle 6 Day 1 of Dara/Rev/Dex 03/17/2022: Cycle 7 Day 1 of Dara/Rev/Dex 04/14/2022: Cycle 8 Day 1 of Dara/Rev/Dex 05/22/2022: Cycle 9 Day 1 of Dara/Rev/Dex 06/19/2022: Cycle 10 Day 1 of Dara/Rev/Dex 07/17/2022: Cycle 11 Day 1 of Dara/Rev/Dex 08/17/2022: start maintenance revlimid 10 mg PO daily 21 of 28 days.   Interval History:  Wesley [name] 82 y.o. male with medical history significant for IgG kappa multiple myeloma who presents for a follow up visit. The patient's last visit was on 02/27/2023. In the interim since the last visit he continued treatment with maintenance revlimid.   On exam today Wesley [name] reports he has been good over in the interim since her last visit.  He reports that he continues to taking his  Revlimid pills as prescribed.  He is about to start his 21-day stretch today.  He notes he is doing physical therapy and that has been going well.  He does still have the abscess on his belly, with 1 predominantly on the lower left abdomen.  One of the prior lesions has subsequently healed.  He reports his energy levels are strong and his appetite is good.  Overall he is willing and able to continue on Xgeva therapy as well as Revlimid.  He denies fevers, chills, night sweats, shortness of breath, chest pain or cough. He has no other complaints. Full 10 point ROS is listed below.  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 PT denies significant findings   CARDIAC CATHETERIZATION  2008   COLOSTOMY N/A 10/31/2019   Procedure: COLOSTOMY;  Surgeon: Harriette Bouillon, MD;  Location: [facility];  Service: General;  Laterality: N/A;   CYSTOSCOPY W/ URETERAL STENT PLACEMENT Bilateral 10/31/2019   Procedure: CYSTOSCOPY URETERAL STENT PLACEMENT BILATERAL;  Surgeon: Marcine Matar, MD;  Location:  [facility];  Service: Urology;  Laterality: Bilateral;   CYSTOSCOPY WITH STENT PLACEMENT Bilateral 03/11/2020   Procedure: CYSTOSCOPY WITH BILATERAL FIREFLY INJECTION;  Surgeon: Bjorn Pippin, MD;  Location: [facility];  Service: Urology;  Laterality: Bilateral;   EYE SURGERY     BILATERAL CATARACT SURGERY WITH LENS IMPLANTS   FLEXIBLE SIGMOIDOSCOPY N/A 10/29/2019   Procedure: FLEXIBLE SIGMOIDOSCOPY;  Surgeon: Beverley Fiedler, MD;  Location: [facility];  Service: Gastroenterology;  Laterality: N/A;   INCISIONAL HERNIA REPAIR N/A 10/13/2020   Procedure: OPEN INCISIONAL HERNIA REPAIR WITH MESH;  Surgeon: Axel Filler, MD;  Location: [facility] OR;  Service: General;  Laterality: N/A;   INSERTION OF MESH N/A 09/08/2016   Procedure: INSERTION OF MESH;  Surgeon: Avel Peace, MD;  Location: [facility];  Service: General;  Laterality: N/A;   IR RADIOLOGIST EVAL & MGMT  11/10/2020   IR RADIOLOGIST EVAL & MGMT  11/25/2020   LAPAROSCOPIC SIGMOID COLECTOMY N/A 10/31/2019   Procedure: DIAGNOSTIC LAPAROSCOPY; EXPLORATORY LAPAROTOMY; SIGMOID COLECTOMY;  Surgeon: Harriette Bouillon, MD;  Location: [facility];  Service: General;  Laterality: N/A;   right rotator cuff     SUBMUCOSAL TATTOO INJECTION  10/29/2019   Procedure: SUBMUCOSAL TATTOO INJECTION;  Surgeon: Beverley Fiedler, MD;  Location: [facility];  Service: Gastroenterology;;   TONSILLECTOMY     UMBILICAL HERNIA REPAIR N/A 09/08/2016   Procedure: OPEN UMBILICAL HERNIA REPAIR WITH MESH;  Surgeon: Avel Peace, MD;  Location: [facility];  Service: General;  Laterality: N/A;   VENTRAL HERNIA REPAIR N/A 02/03/2021   Procedure: LAPAROSCOPIC VENTRAL HERNIA REPAIR WITH MESH;  Surgeon: Axel Filler, MD;  Location[facility] OR;  Service: General;  Laterality: N/A;   XI ROBOTIC ASSISTED COLOSTOMY TAKEDOWN N/A 03/11/2020   Procedure: ROBOTIC ASSISTED COLOSTOMY REVERSAL, RIGID PROCTOSCOPY;  Surgeon: Romie Levee, MD;  Location: [facility];  Service: General;  Laterality: N/A;    SOCIAL  HISTORY: Social History   Socioeconomic History   Marital status: Married    Spouse name: Huntington Leverich    Number of children: 2   Years of education: 12   Highest education level: Master's degree (e.g., MA, MS, MEng, MEd, MSW, MBA)  Occupational History   Occupation: Retired   Tobacco Use   Smoking status: Never   Smokeless tobacco: Never  Vaping Use   Vaping status: Never Used  Substance and Sexual Activity   Alcohol use: Yes    Alcohol/week: 17.0 standard drinks of alcohol    Types: 3 Glasses of wine, 14 Standard drinks or equivalent per week   Drug use: No   Sexual activity: Not Currently  Other Topics Concern   Not on file  Social History Narrative   Married. 2 children 52 in 48 in 2022- both went to [employer]. 5 grandkids- all girls from oldest 65 looking premed [employer], 4 soph [place], 2 32 year olds, 10 year olds.       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 [employer]  Hobbies: fishing, [place] football and basketball, [place], yardwork   Social Determinants of Health   Financial Resource Strain: Low Risk  (05/08/2022)   Overall Financial Resource Strain (CARDIA)    Difficulty of Paying Living Expenses: Not hard at all  Food Insecurity: No Food Insecurity (07/12/2022)   Hunger Vital Sign    Worried About Running Out of Food in the Last Year: Never true    Ran Out of Food in the Last Year: Never true  Transportation Needs: No Transportation Needs (07/12/2022)   PRAPARE - Administrator, Civil Service (Medical): No    Lack of Transportation (Non-Medical): No  Physical Activity: Insufficiently Active (05/08/2022)   Exercise Vital Sign    Days of Exercise per Week: 3 days    Minutes of Exercise per Session: 20 min  Stress: No Stress Concern Present (05/08/2022)   [employer] of Occupational Health - Occupational Stress Questionnaire     Feeling of Stress : Only a little  Social Connections: Socially Integrated (05/08/2022)   Social Connection and Isolation Panel [NHANES]    Frequency of Communication with Friends and Family: More than three times a week    Frequency of Social Gatherings with Friends and Family: Once a week    Attends Religious Services: More than 4 times per year    Active Member of [employer] or Organizations: Yes    Attends Engineer, structural: More than 4 times per year    Marital Status: Married  Catering manager Violence: Not At Risk (05/08/2022)   Humiliation, Afraid, Rape, and Kick questionnaire    Fear of Current or Ex-Partner: No    Emotionally Abused: No    Physically Abused: No    Sexually Abused: No    FAMILY HISTORY: Family History  Problem Relation Age of Onset   Diabetes Mother    Heart failure Mother        around 81   CAD Father        47   Melanoma Brother 32   Healthy Daughter    Healthy Son    Colon cancer Neg Hx    Esophageal cancer Neg Hx    Rectal cancer Neg Hx    Stomach cancer Neg Hx     ALLERGIES:  is allergic to demerol, promethazine hcl, and ativan [lorazepam].  MEDICATIONS:  Current Outpatient Medications  Medication Sig Dispense Refill   acetaminophen (TYLENOL) 500 MG tablet Take 500 mg by mouth every 6 (six) hours as needed.     acyclovir (ZOVIRAX) 400 MG tablet Take 1 tablet (400 mg total) by mouth in the morning and at bedtime. (Patient not taking: Reported on 12/05/2022) 60 tablet 2   allopurinol (ZYLOPRIM) 300 MG tablet Take 1 tablet (300 mg total) by mouth daily. (Patient not taking: Reported on 12/05/2022) 60 tablet 2   aspirin EC 81 MG tablet Take 81 mg by mouth daily. Swallow whole.     atorvastatin (LIPITOR) 80 MG tablet Take 1 tablet (80 mg total) by mouth daily. Pt needs to schedule Office Visit 90 tablet 0   Cholecalciferol (VITAMIN D3) 25 MCG (1000 UT) CAPS Take 1,000 Units by mouth daily.     dexamethasone (DECADRON) 4 MG tablet Take 5  tablets (20 mg total) by mouth once a week. 20 tablet 3   fexofenadine (ALLEGRA) 180 MG tablet      finasteride (PROSCAR) 5 MG tablet Take 5 mg by mouth daily.     fluticasone (FLONASE) 50 MCG/ACT  nasal spray Place into the nose.     gabapentin (NEURONTIN) 300 MG capsule Take 1 capsule (300 mg total) by mouth at bedtime. 30 capsule 0   Ibuprofen 200 MG CAPS      lenalidomide (REVLIMID) 10 MG capsule Take 1 capsule (10 mg total) by mouth daily. Take 1 capsule daily for 21 days and then none for 7 days. 21 capsule 0   lisinopril (ZESTRIL) 10 MG tablet TAKE 1 TABLET (10 MG TOTAL) BY MOUTH DAILY. 90 tablet 3   loratadine (CLARITIN) 10 MG tablet Take 10 mg by mouth daily.     Magnesium Oxide 400 MG CAPS      melatonin 5 MG TABS      Omega-3 Fatty Acids (FISH OIL) 1200 MG CAPS Take 1,200 mg by mouth daily.     ondansetron (ZOFRAN) 8 MG tablet Take 1 tablet (8 mg total) by mouth every 8 (eight) hours as needed. (Patient not taking: Reported on 05/14/2023) 30 tablet 0   pantoprazole (PROTONIX) 40 MG tablet Take by mouth. (Patient not taking: Reported on 12/05/2022)     prochlorperazine (COMPAZINE) 10 MG tablet Take 1 tablet (10 mg total) by mouth every 6 (six) hours as needed for nausea or vomiting. 30 tablet 0   vitamin B-12 (CYANOCOBALAMIN) 1000 MCG tablet Take 1,000 mcg by mouth daily.     No current facility-administered medications for this visit.    REVIEW OF SYSTEMS:   Constitutional: ( - ) fevers, ( - )  chills , ( - ) night sweats Eyes: ( - ) blurriness of vision, ( - ) double vision, ( - ) watery eyes Ears, nose, mouth, throat, and face: ( - ) mucositis, ( - ) sore throat Respiratory: ( - ) cough, ( - ) dyspnea, ( - ) wheezes Cardiovascular: ( - ) palpitation, ( - ) chest discomfort, ( - ) lower extremity swelling Gastrointestinal:  ( - ) nausea, ( - ) heartburn, ( - ) change in bowel habits Skin: ( - ) abnormal skin rashes Lymphatics: ( - ) new lymphadenopathy, ( - ) easy  bruising Neurological: ( + ) numbness, ( - ) tingling, ( - ) new weaknesses Behavioral/Psych: ( - ) mood change, ( - ) new changes  All other systems were reviewed with the patient and are negative.  PHYSICAL EXAMINATION: ECOG PERFORMANCE STATUS: 0 - Asymptomatic  Vitals:   05/23/23 0958 05/23/23 1023  BP: 138/85 138/85  Pulse: 65 65  Resp: 13 13  Temp: 97.8 F (36.6 C) 97.8 F (36.6 C)  SpO2: 98% 98%      Filed Weights   05/23/23 0958 05/23/23 1023  Weight: 195 lb 3.2 oz (88.5 kg) 195 lb 3.2 oz (88.5 kg)    GENERAL: Well-appearing elderly Caucasian male, alert, no distress and comfortable SKIN: skin color, texture, turgor are normal, no rashes or significant lesions EYES: conjunctiva are pink and non-injected, sclera clear LUNGS: clear to auscultation and percussion with normal breathing effort HEART: regular rate & rhythm and no murmurs and no lower extremity edema Musculoskeletal: no cyanosis of digits and no clubbing  PSYCH: alert & oriented x 3, fluent speech NEURO: no focal motor/sensory deficits  LABORATORY DATA:  I have reviewed the data as listed    Latest Ref Rng & Units 05/23/2023    9:23 AM 04/25/2023   10:34 AM 03/28/2023   10:40 AM  CBC  WBC 4.0 - 10.5 K/uL 4.2  4.3  4.4   Hemoglobin 13.0 -  17.0 g/dL 16.1  09.6  04.5   Hematocrit 39.0 - 52.0 % 42.8  44.5  41.6   Platelets 150 - 400 K/uL 154  181  152        Latest Ref Rng & Units 05/23/2023    9:23 AM 04/25/2023   10:34 AM 03/28/2023   10:40 AM  CMP  Glucose 70 - 99 mg/dL 409  811  914   BUN 8 - 23 mg/dL 18  15  15    Creatinine 0.61 - 1.24 mg/dL 7.82  9.56  2.13   Sodium 135 - 145 mmol/L 140  139  141   Potassium 3.5 - 5.1 mmol/L 3.9  4.3  4.1   Chloride 98 - 111 mmol/L 107  108  109   CO2 22 - 32 mmol/L 26  24  25    Calcium 8.9 - 10.3 mg/dL 8.8  8.8  8.4   Total Protein 6.5 - 8.1 g/dL 6.4  6.6  6.2   Total Bilirubin 0.3 - 1.2 mg/dL 1.4  1.0  0.8   Alkaline Phos 38 - 126 U/L 66  66  65   AST  15 - 41 U/L 24  24  21    ALT 0 - 44 U/L 26  31  23      Lab Results  Component Value Date   MPROTEIN Not Observed 04/25/2023   MPROTEIN Not Observed 03/28/2023   MPROTEIN Not Observed 02/27/2023   Lab Results  Component Value Date   KPAFRELGTCHN 12.9 04/25/2023   KPAFRELGTCHN 12.5 03/28/2023   KPAFRELGTCHN 12.6 02/27/2023   LAMBDASER 11.4 04/25/2023   LAMBDASER 9.0 03/28/2023   LAMBDASER 11.0 02/27/2023   KAPLAMBRATIO 1.13 04/25/2023   KAPLAMBRATIO 1.39 03/28/2023   KAPLAMBRATIO 1.15 02/27/2023    RADIOGRAPHIC STUDIES: No results found.  ASSESSMENT & PLAN Wesley [name] is a 82 y.o. male with medical history significant for IgG kappa multiple myeloma who presents for a follow up visit.   # IgG Kappa Multiple Myeloma --Patient meets diagnostic criteria for multiple myeloma based on the presence of 60% plasma cells in the bone marrow --Pretreatment phenotype RBC as well as type and screen ordered  --Cycle 1 Day 1of Dara/Rev/Dex started on 09/30/2021. He started without revlimid which was added on 10/06/2021 (Cycle 1 Day 8)  --Transitioned to maintenance Revlimid on 08/17/2022.  Plan:  --Currently on maintenance revlimid 10 mg PO daily 21 of 28 days.  --Labs today showed WBC 4.2, hemoglobin 14.7, MCV 104.6, and platelets of 154. Creatinine and LFTs normal. Myeloma labs pending today --Most recent myeloma labs from 04/25/2023 did not detect M protein with normal sFLC. -- Return to clinic in 4 weeks for labs/Xgeva and in 3 months for a repeat clinic visit   #Lethargy 2/2 Zometa:  --Discussed alternative which would be Xgeva q 28 days.  -- continue monthly Xgeva.    # Boils on Abdomen-stable -- Patient underwent I&D and subsequent antibiotic treatment with no resolution.  2 lesions have developed at the site of his prior ostomy.  A new lesion was present on 06/19/2022. --Patient was seen by surgery who ordered CT scan on 01/31/2022 that showed no fluid collection in the anterior  abdominal wall. Instructed patient on how to pack wounds.  --No systemic symptoms, okay to continue with systemic chemotherapy. --continue to follow with surgery.   # Right hand neuropathy:  --Suspect carpal tunnel so recommend wearing a wrist brace.  --follows with [facility], next visit Sept 2025  #Supportive Care --  chemotherapy education complete  -- port placement not required.  --ASA 81 mg p.o. daily for thromboprophylaxis on Revlimid. -- zofran 8mg  q8H PRN and compazine 10mg  PO q6H for nausea -- zometa clearance obtained, started on 03/03/2022. Had lethargy with zometa, will be converting to Xgeva.  Continue monthly injections or Xgeva x 2 years (July 2025).  -- no pain medication required at this time.   No orders of the defined types were placed in this encounter.  All questions were answered. The patient knows to call the clinic with any problems, questions or concerns.  I have spent a total of 30 minutes minutes of face-to-face and non-face-to-face time, preparing to see the patient, performing a medically appropriate examination, counseling and educating the patient, ordering medications/tests, documenting clinical information in the electronic health record,  and care coordination.   Ulysees Barns, MD Department of Hematology/Oncology [facility] Phone: [phone] Pager: [phone] Email: [email or web address]  05/23/2023 11:05 AM

## 2023-05-23 ENCOUNTER — Inpatient Hospital Stay: Payer: Medicare Other

## 2023-05-23 ENCOUNTER — Inpatient Hospital Stay: Payer: Medicare Other | Attending: Hematology and Oncology

## 2023-05-23 ENCOUNTER — Inpatient Hospital Stay (HOSPITAL_BASED_OUTPATIENT_CLINIC_OR_DEPARTMENT_OTHER): Payer: Medicare Other | Admitting: Hematology and Oncology

## 2023-05-23 VITALS — BP 138/85 | HR 65 | Temp 97.8°F | Resp 13 | Wt 195.2 lb

## 2023-05-23 DIAGNOSIS — C9000 Multiple myeloma not having achieved remission: Secondary | ICD-10-CM | POA: Insufficient documentation

## 2023-05-23 DIAGNOSIS — C9001 Multiple myeloma in remission: Secondary | ICD-10-CM | POA: Diagnosis not present

## 2023-05-23 LAB — CMP (CANCER CENTER ONLY)
ALT: 26 U/L (ref 0–44)
AST: 24 U/L (ref 15–41)
Albumin: 4.1 g/dL (ref 3.5–5.0)
Alkaline Phosphatase: 66 U/L (ref 38–126)
Anion gap: 7 (ref 5–15)
BUN: 18 mg/dL (ref 8–23)
CO2: 26 mmol/L (ref 22–32)
Calcium: 8.8 mg/dL — ABNORMAL LOW (ref 8.9–10.3)
Chloride: 107 mmol/L (ref 98–111)
Creatinine: 0.94 mg/dL (ref 0.61–1.24)
GFR, Estimated: >60 mL/min (ref >60)
Glucose, Bld: 125 mg/dL — ABNORMAL HIGH (ref 70–99)
Potassium: 3.9 mmol/L (ref 3.5–5.1)
Sodium: 140 mmol/L (ref 135–145)
Total Bilirubin: 1.4 mg/dL — ABNORMAL HIGH (ref 0.3–1.2)
Total Protein: 6.4 g/dL — ABNORMAL LOW (ref 6.5–8.1)

## 2023-05-23 LAB — CBC WITH DIFFERENTIAL (CANCER CENTER ONLY)
Abs Immature Granulocytes: 0.01 10*3/uL (ref 0.00–0.07)
Basophils Absolute: 0.1 10*3/uL (ref 0.0–0.1)
Basophils Relative: 2 %
Eosinophils Absolute: 0.1 10*3/uL (ref 0.0–0.5)
Eosinophils Relative: 3 %
HCT: 42.8 % (ref 39.0–52.0)
Hemoglobin: 14.7 g/dL (ref 13.0–17.0)
Immature Granulocytes: 0 %
Lymphocytes Relative: 27 %
Lymphs Abs: 1.1 10*3/uL (ref 0.7–4.0)
MCH: 35.9 pg — ABNORMAL HIGH (ref 26.0–34.0)
MCHC: 34.3 g/dL (ref 30.0–36.0)
MCV: 104.6 fL — ABNORMAL HIGH (ref 80.0–100.0)
Monocytes Absolute: 0.5 10*3/uL (ref 0.1–1.0)
Monocytes Relative: 12 %
Neutro Abs: 2.4 10*3/uL (ref 1.7–7.7)
Neutrophils Relative %: 56 %
Platelet Count: 154 10*3/uL (ref 150–400)
RBC: 4.09 MIL/uL — ABNORMAL LOW (ref 4.22–5.81)
RDW: 14.4 % (ref 11.5–15.5)
WBC Count: 4.2 10*3/uL (ref 4.0–10.5)
nRBC: 0 % (ref 0.0–0.2)

## 2023-05-23 LAB — LACTATE DEHYDROGENASE: LDH: 158 U/L (ref 98–192)

## 2023-05-23 MED ORDER — DENOSUMAB 120 MG/1.7ML ~~LOC~~ SOLN
120.0000 mg | Freq: Once | SUBCUTANEOUS | Status: AC
  Administered 2023-05-23: 120 mg via SUBCUTANEOUS
  Filled 2023-05-23: qty 1.7

## 2023-05-24 ENCOUNTER — Other Ambulatory Visit: Payer: Medicare Other

## 2023-05-24 ENCOUNTER — Ambulatory Visit: Payer: Medicare Other | Admitting: Hematology and Oncology

## 2023-05-24 ENCOUNTER — Ambulatory Visit: Payer: Medicare Other

## 2023-05-24 LAB — KAPPA/LAMBDA LIGHT CHAINS
Kappa free light chain: 13.9 mg/L (ref 3.3–19.4)
Kappa, lambda light chain ratio: 1.29 (ref 0.26–1.65)
Lambda free light chains: 10.8 mg/L (ref 5.7–26.3)

## 2023-05-25 LAB — MULTIPLE MYELOMA PANEL, SERUM
Albumin SerPl Elph-Mcnc: 3.7 g/dL (ref 2.9–4.4)
Albumin/Glob SerPl: 1.7 (ref 0.7–1.7)
Alpha 1: 0.2 g/dL (ref 0.0–0.4)
Alpha2 Glob SerPl Elph-Mcnc: 0.6 g/dL (ref 0.4–1.0)
B-Globulin SerPl Elph-Mcnc: 0.9 g/dL (ref 0.7–1.3)
Gamma Glob SerPl Elph-Mcnc: 0.6 g/dL (ref 0.4–1.8)
Globulin, Total: 2.3 g/dL (ref 2.2–3.9)
IgA: 164 mg/dL (ref 61–437)
IgG (Immunoglobin G), Serum: 642 mg/dL (ref 603–1613)
IgM (Immunoglobulin M), Srm: 31 mg/dL (ref 15–143)
Total Protein ELP: 6 g/dL (ref 6.0–8.5)

## 2023-05-29 ENCOUNTER — Telehealth: Payer: Self-pay | Admitting: Hematology and Oncology

## 2023-06-03 ENCOUNTER — Other Ambulatory Visit: Payer: Self-pay | Admitting: Hematology and Oncology

## 2023-06-03 DIAGNOSIS — C9000 Multiple myeloma not having achieved remission: Secondary | ICD-10-CM

## 2023-06-04 ENCOUNTER — Other Ambulatory Visit: Payer: Self-pay | Admitting: *Deleted

## 2023-06-04 DIAGNOSIS — C9000 Multiple myeloma not having achieved remission: Secondary | ICD-10-CM

## 2023-06-04 MED ORDER — LENALIDOMIDE 10 MG PO CAPS: 10.0000 mg | ORAL_CAPSULE | Freq: Every day | ORAL | 0 refills | Status: DC

## 2023-06-06 DIAGNOSIS — Z85828 Personal history of other malignant neoplasm of skin: Secondary | ICD-10-CM | POA: Diagnosis not present

## 2023-06-06 DIAGNOSIS — L905 Scar conditions and fibrosis of skin: Secondary | ICD-10-CM | POA: Diagnosis not present

## 2023-06-08 DIAGNOSIS — Z23 Encounter for immunization: Secondary | ICD-10-CM | POA: Diagnosis not present

## 2023-06-13 DIAGNOSIS — Z23 Encounter for immunization: Secondary | ICD-10-CM | POA: Diagnosis not present

## 2023-06-20 ENCOUNTER — Other Ambulatory Visit: Payer: Medicare Other | Attending: Hematology and Oncology

## 2023-06-20 ENCOUNTER — Other Ambulatory Visit: Payer: Medicare Other

## 2023-06-20 ENCOUNTER — Inpatient Hospital Stay: Payer: Medicare Other

## 2023-06-20 ENCOUNTER — Ambulatory Visit: Payer: Medicare Other

## 2023-06-20 VITALS — BP 146/81 | HR 60 | Temp 97.9°F | Resp 17

## 2023-06-20 DIAGNOSIS — C9000 Multiple myeloma not having achieved remission: Secondary | ICD-10-CM

## 2023-06-20 DIAGNOSIS — Z79899 Other long term (current) drug therapy: Secondary | ICD-10-CM | POA: Insufficient documentation

## 2023-06-20 LAB — CBC WITH DIFFERENTIAL (CANCER CENTER ONLY)
Abs Immature Granulocytes: 0.02 10*3/uL (ref 0.00–0.07)
Basophils Absolute: 0.1 10*3/uL (ref 0.0–0.1)
Basophils Relative: 2 %
Eosinophils Absolute: 0.2 10*3/uL (ref 0.0–0.5)
Eosinophils Relative: 4 %
HCT: 42 % (ref 39.0–52.0)
Hemoglobin: 14.3 g/dL (ref 13.0–17.0)
Immature Granulocytes: 0 %
Lymphocytes Relative: 29 %
Lymphs Abs: 1.4 10*3/uL (ref 0.7–4.0)
MCH: 35.8 pg — ABNORMAL HIGH (ref 26.0–34.0)
MCHC: 34 g/dL (ref 30.0–36.0)
MCV: 105.3 fL — ABNORMAL HIGH (ref 80.0–100.0)
Monocytes Absolute: 0.6 10*3/uL (ref 0.1–1.0)
Monocytes Relative: 13 %
Neutro Abs: 2.5 10*3/uL (ref 1.7–7.7)
Neutrophils Relative %: 52 %
Platelet Count: 183 10*3/uL (ref 150–400)
RBC: 3.99 MIL/uL — ABNORMAL LOW (ref 4.22–5.81)
RDW: 15.2 % (ref 11.5–15.5)
WBC Count: 4.8 10*3/uL (ref 4.0–10.5)
nRBC: 0 % (ref 0.0–0.2)

## 2023-06-20 LAB — CMP (CANCER CENTER ONLY)
ALT: 28 U/L (ref 0–44)
AST: 21 U/L (ref 15–41)
Albumin: 4 g/dL (ref 3.5–5.0)
Alkaline Phosphatase: 68 U/L (ref 38–126)
Anion gap: 6 (ref 5–15)
BUN: 19 mg/dL (ref 8–23)
CO2: 28 mmol/L (ref 22–32)
Calcium: 9 mg/dL (ref 8.9–10.3)
Chloride: 108 mmol/L (ref 98–111)
Creatinine: 0.94 mg/dL (ref 0.61–1.24)
GFR, Estimated: >60 mL/min (ref >60)
Glucose, Bld: 111 mg/dL — ABNORMAL HIGH (ref 70–99)
Potassium: 4 mmol/L (ref 3.5–5.1)
Sodium: 142 mmol/L (ref 135–145)
Total Bilirubin: 0.9 mg/dL (ref <1.2)
Total Protein: 6.5 g/dL (ref 6.5–8.1)

## 2023-06-20 LAB — LACTATE DEHYDROGENASE: LDH: 149 U/L (ref 98–192)

## 2023-06-20 MED ORDER — DENOSUMAB 120 MG/1.7ML ~~LOC~~ SOLN
120.0000 mg | Freq: Once | SUBCUTANEOUS | Status: AC
  Administered 2023-06-20: 120 mg via SUBCUTANEOUS
  Filled 2023-06-20: qty 1.7

## 2023-06-21 LAB — KAPPA/LAMBDA LIGHT CHAINS
Kappa free light chain: 18.5 mg/L (ref 3.3–19.4)
Kappa, lambda light chain ratio: 1.36 (ref 0.26–1.65)
Lambda free light chains: 13.6 mg/L (ref 5.7–26.3)

## 2023-06-25 LAB — MULTIPLE MYELOMA PANEL, SERUM
Albumin SerPl Elph-Mcnc: 3.6 g/dL (ref 2.9–4.4)
Albumin/Glob SerPl: 1.5 (ref 0.7–1.7)
Alpha 1: 0.2 g/dL (ref 0.0–0.4)
Alpha2 Glob SerPl Elph-Mcnc: 0.6 g/dL (ref 0.4–1.0)
B-Globulin SerPl Elph-Mcnc: 1 g/dL (ref 0.7–1.3)
Gamma Glob SerPl Elph-Mcnc: 0.6 g/dL (ref 0.4–1.8)
Globulin, Total: 2.5 g/dL (ref 2.2–3.9)
IgA: 230 mg/dL (ref 61–437)
IgG (Immunoglobin G), Serum: 728 mg/dL (ref 603–1613)
IgM (Immunoglobulin M), Srm: 42 mg/dL (ref 15–143)
Total Protein ELP: 6.1 g/dL (ref 6.0–8.5)

## 2023-06-26 ENCOUNTER — Other Ambulatory Visit: Payer: Self-pay | Admitting: Hematology and Oncology

## 2023-06-26 DIAGNOSIS — C9000 Multiple myeloma not having achieved remission: Secondary | ICD-10-CM

## 2023-06-27 ENCOUNTER — Other Ambulatory Visit: Payer: Self-pay | Admitting: *Deleted

## 2023-06-27 DIAGNOSIS — R531 Weakness: Secondary | ICD-10-CM | POA: Diagnosis not present

## 2023-06-27 DIAGNOSIS — C9000 Multiple myeloma not having achieved remission: Secondary | ICD-10-CM

## 2023-06-27 DIAGNOSIS — M6281 Muscle weakness (generalized): Secondary | ICD-10-CM | POA: Diagnosis not present

## 2023-06-27 DIAGNOSIS — R269 Unspecified abnormalities of gait and mobility: Secondary | ICD-10-CM | POA: Diagnosis not present

## 2023-06-27 MED ORDER — LENALIDOMIDE 10 MG PO CAPS: 10.0000 mg | ORAL_CAPSULE | Freq: Every day | ORAL | 0 refills | Status: DC

## 2023-07-03 DIAGNOSIS — R531 Weakness: Secondary | ICD-10-CM | POA: Diagnosis not present

## 2023-07-03 DIAGNOSIS — R269 Unspecified abnormalities of gait and mobility: Secondary | ICD-10-CM | POA: Diagnosis not present

## 2023-07-03 DIAGNOSIS — M6281 Muscle weakness (generalized): Secondary | ICD-10-CM | POA: Diagnosis not present

## 2023-07-06 DIAGNOSIS — R531 Weakness: Secondary | ICD-10-CM | POA: Diagnosis not present

## 2023-07-06 DIAGNOSIS — M6281 Muscle weakness (generalized): Secondary | ICD-10-CM | POA: Diagnosis not present

## 2023-07-06 DIAGNOSIS — R269 Unspecified abnormalities of gait and mobility: Secondary | ICD-10-CM | POA: Diagnosis not present

## 2023-07-10 DIAGNOSIS — R531 Weakness: Secondary | ICD-10-CM | POA: Diagnosis not present

## 2023-07-10 DIAGNOSIS — M6281 Muscle weakness (generalized): Secondary | ICD-10-CM | POA: Diagnosis not present

## 2023-07-10 DIAGNOSIS — R269 Unspecified abnormalities of gait and mobility: Secondary | ICD-10-CM | POA: Diagnosis not present

## 2023-07-18 ENCOUNTER — Telehealth: Payer: Self-pay | Admitting: Hematology and Oncology

## 2023-07-18 ENCOUNTER — Inpatient Hospital Stay: Payer: Medicare Other

## 2023-07-19 ENCOUNTER — Telehealth: Payer: Self-pay | Admitting: *Deleted

## 2023-07-19 NOTE — Telephone Encounter (Signed)
Received call from pt. He states he thinks he has a cold-has sniffles, runny nose and a cough. He wanted to make sure it was ok for him to take Robitussin for the cough. Advised that he could do this. Advised to have a covid test done as well. If that were to be positive, advised to call his PCP about it. Pt voiced understanding.

## 2023-07-20 ENCOUNTER — Telehealth: Payer: Self-pay | Admitting: *Deleted

## 2023-07-20 ENCOUNTER — Other Ambulatory Visit (HOSPITAL_BASED_OUTPATIENT_CLINIC_OR_DEPARTMENT_OTHER): Payer: Self-pay

## 2023-07-20 ENCOUNTER — Ambulatory Visit (INDEPENDENT_AMBULATORY_CARE_PROVIDER_SITE_OTHER): Payer: Medicare Other | Admitting: Physician Assistant

## 2023-07-20 VITALS — BP 156/93 | HR 75 | Temp 97.8°F | Ht 68.0 in | Wt 195.4 lb

## 2023-07-20 DIAGNOSIS — R6889 Other general symptoms and signs: Secondary | ICD-10-CM | POA: Diagnosis not present

## 2023-07-20 DIAGNOSIS — J069 Acute upper respiratory infection, unspecified: Secondary | ICD-10-CM | POA: Diagnosis not present

## 2023-07-20 LAB — POCT INFLUENZA A/B
Influenza A, POC: NEGATIVE
Influenza B, POC: NEGATIVE

## 2023-07-20 LAB — POC COVID19 BINAXNOW: SARS Coronavirus 2 Ag: NEGATIVE

## 2023-07-20 NOTE — Telephone Encounter (Signed)
TCT patient after receiving vm message from him that his URI has gotten worse.  Spoke with him. He advises that he does have an appt with his PCP this afternoon. Advised that that was the best thing to do as Dr. Leonides Schanz is not in the office today and will not be back until 07/25/23.  No further concerns at this time.

## 2023-07-20 NOTE — Progress Notes (Signed)
Established patient visit   Patient: Wesley [name]   DOB: 03-04-1941   82 y.o. Male  MRN: [id number] Visit Date: 07/20/2023  Today's healthcare provider: Alfredia Ferguson, PA-C   Chief Complaint  Patient presents with   flu like symptoms    States its been about 3 days- congestion and mucus- yellow. Cough and feel it in his chest, no sore throat. Mild headache. No fever    Subjective     Pt reports congestion, cough with yellow mucous production x 3 days, mild headache. Denies fever. Claritin otc robitussion . Covid negative at home yesterday.  Medications: Outpatient Medications Prior to Visit  Medication Sig   acetaminophen (TYLENOL) 500 MG tablet Take 500 mg by mouth every 6 (six) hours as needed.   acyclovir (ZOVIRAX) 400 MG tablet Take 1 tablet (400 mg total) by mouth in the morning and at bedtime.   allopurinol (ZYLOPRIM) 300 MG tablet Take 1 tablet (300 mg total) by mouth daily.   aspirin EC 81 MG tablet Take 81 mg by mouth daily. Swallow whole.   atorvastatin (LIPITOR) 80 MG tablet Take 1 tablet (80 mg total) by mouth daily. Pt needs to schedule Office Visit   Cholecalciferol (VITAMIN D3) 25 MCG (1000 UT) CAPS Take 1,000 Units by mouth daily.   dexamethasone (DECADRON) 4 MG tablet Take 5 tablets (20 mg total) by mouth once a week.   fexofenadine (ALLEGRA) 180 MG tablet    finasteride (PROSCAR) 5 MG tablet Take 5 mg by mouth daily.   fluticasone (FLONASE) 50 MCG/ACT nasal spray Place into the nose.   gabapentin (NEURONTIN) 300 MG capsule Take 1 capsule (300 mg total) by mouth at bedtime.   Ibuprofen 200 MG CAPS    lenalidomide (REVLIMID) 10 MG capsule Take 1 capsule (10 mg total) by mouth daily. Take 1 capsule daily for 21 days and then none for 7 days.   lisinopril (ZESTRIL) 10 MG tablet TAKE 1 TABLET (10 MG TOTAL) BY MOUTH DAILY.   loratadine (CLARITIN) 10 MG tablet Take 10 mg by mouth daily.   Magnesium Oxide 400 MG CAPS    melatonin 5 MG TABS    Omega-3  Fatty Acids (FISH OIL) 1200 MG CAPS Take 1,200 mg by mouth daily.   ondansetron (ZOFRAN) 8 MG tablet Take 1 tablet (8 mg total) by mouth every 8 (eight) hours as needed.   pantoprazole (PROTONIX) 40 MG tablet Take by mouth.   prochlorperazine (COMPAZINE) 10 MG tablet Take 1 tablet (10 mg total) by mouth every 6 (six) hours as needed for nausea or vomiting.   vitamin B-12 (CYANOCOBALAMIN) 1000 MCG tablet Take 1,000 mcg by mouth daily.   No facility-administered medications prior to visit.    Review of Systems  Constitutional:  Negative for fatigue and fever.  HENT:  Positive for congestion and sore throat.   Respiratory:  Positive for cough. Negative for shortness of breath.   Cardiovascular:  Negative for chest pain, palpitations and leg swelling.  Neurological:  Negative for dizziness and headaches.       Objective    BP (!) 156/93   Pulse 75   Temp 97.8 F (36.6 C) (Oral)   Ht 5\' 8"  (1.727 m)   Wt 195 lb 6 oz (88.6 kg)   SpO2 98%   BMI 29.71 kg/m    Physical Exam Constitutional:      General: He is awake.     Appearance: He is well-developed.  HENT:  Head: Normocephalic.     Ears:     Comments: Serous fluid behind b/l TM, no bulging, erythema    Mouth/Throat:     Pharynx: Posterior oropharyngeal erythema present. No oropharyngeal exudate.  Eyes:     Conjunctiva/sclera: Conjunctivae normal.  Cardiovascular:     Rate and Rhythm: Normal rate and regular rhythm.     Heart sounds: Normal heart sounds.  Pulmonary:     Effort: Pulmonary effort is normal.     Breath sounds: Normal breath sounds.  Skin:    General: Skin is warm.  Neurological:     Mental Status: He is alert and oriented to person, place, and time.  Psychiatric:        Attention and Perception: Attention normal.        Mood and Affect: Mood normal.        Speech: Speech normal.        Behavior: Behavior is cooperative.     Results for orders placed or performed in visit on 07/20/23  POCT  Influenza A/B  Result Value Ref Range   Influenza A, POC Negative Negative   Influenza B, POC Negative Negative  POC COVID-19  Result Value Ref Range   SARS Coronavirus 2 Ag Negative Negative    Assessment & Plan    Upper respiratory tract infection, unspecified type  Flu-like symptoms -     POCT Influenza A/B -     POC COVID-19 BinaxNow  Poc flu, covid negative  Recommend cot robitussion, claritin. Recommending saline nasal sprays otc . Hydrate, rest, tylenol. Any worsening or persistent symptoms past 7 days please contact office for further recommendations  Return if symptoms worsen or fail to improve.       Alfredia Ferguson, PA-C  [facility] at [facility] [phone] (phone) [phone] (fax)  [facility]

## 2023-07-24 ENCOUNTER — Other Ambulatory Visit: Payer: Self-pay | Admitting: Hematology and Oncology

## 2023-07-24 DIAGNOSIS — C9000 Multiple myeloma not having achieved remission: Secondary | ICD-10-CM

## 2023-07-25 ENCOUNTER — Other Ambulatory Visit: Payer: Self-pay | Admitting: *Deleted

## 2023-07-25 ENCOUNTER — Inpatient Hospital Stay: Payer: Medicare Other

## 2023-07-25 DIAGNOSIS — C9000 Multiple myeloma not having achieved remission: Secondary | ICD-10-CM

## 2023-07-25 MED ORDER — LENALIDOMIDE 10 MG PO CAPS: 10.0000 mg | ORAL_CAPSULE | Freq: Every day | ORAL | 0 refills | Status: DC

## 2023-07-26 ENCOUNTER — Telehealth: Payer: Self-pay | Admitting: Hematology and Oncology

## 2023-07-27 ENCOUNTER — Encounter: Payer: Self-pay | Admitting: Hematology and Oncology

## 2023-07-29 ENCOUNTER — Other Ambulatory Visit: Payer: Self-pay | Admitting: Cardiology

## 2023-08-01 ENCOUNTER — Inpatient Hospital Stay: Payer: Medicare Other

## 2023-08-01 ENCOUNTER — Inpatient Hospital Stay: Payer: Medicare Other | Attending: Hematology and Oncology

## 2023-08-01 VITALS — BP 144/89 | HR 77 | Temp 98.0°F | Resp 18

## 2023-08-01 DIAGNOSIS — C9000 Multiple myeloma not having achieved remission: Secondary | ICD-10-CM

## 2023-08-01 LAB — CMP (CANCER CENTER ONLY)
ALT: 15 U/L (ref 0–44)
AST: 16 U/L (ref 15–41)
Albumin: 3.9 g/dL (ref 3.5–5.0)
Alkaline Phosphatase: 95 U/L (ref 38–126)
Anion gap: 6 (ref 5–15)
BUN: 13 mg/dL (ref 8–23)
CO2: 26 mmol/L (ref 22–32)
Calcium: 8.9 mg/dL (ref 8.9–10.3)
Chloride: 107 mmol/L (ref 98–111)
Creatinine: 0.84 mg/dL (ref 0.61–1.24)
GFR, Estimated: >60 mL/min (ref >60)
Glucose, Bld: 96 mg/dL (ref 70–99)
Potassium: 3.8 mmol/L (ref 3.5–5.1)
Sodium: 139 mmol/L (ref 135–145)
Total Bilirubin: 1 mg/dL (ref <1.2)
Total Protein: 6.3 g/dL — ABNORMAL LOW (ref 6.5–8.1)

## 2023-08-01 LAB — CBC WITH DIFFERENTIAL (CANCER CENTER ONLY)
Abs Immature Granulocytes: 0.08 10*3/uL — ABNORMAL HIGH (ref 0.00–0.07)
Basophils Absolute: 0.1 10*3/uL (ref 0.0–0.1)
Basophils Relative: 1 %
Eosinophils Absolute: 0.3 10*3/uL (ref 0.0–0.5)
Eosinophils Relative: 4 %
HCT: 41.4 % (ref 39.0–52.0)
Hemoglobin: 14.3 g/dL (ref 13.0–17.0)
Immature Granulocytes: 1 %
Lymphocytes Relative: 18 %
Lymphs Abs: 1.1 10*3/uL (ref 0.7–4.0)
MCH: 35.8 pg — ABNORMAL HIGH (ref 26.0–34.0)
MCHC: 34.5 g/dL (ref 30.0–36.0)
MCV: 103.5 fL — ABNORMAL HIGH (ref 80.0–100.0)
Monocytes Absolute: 0.5 10*3/uL (ref 0.1–1.0)
Monocytes Relative: 7 %
Neutro Abs: 4.4 10*3/uL (ref 1.7–7.7)
Neutrophils Relative %: 69 %
Platelet Count: 249 10*3/uL (ref 150–400)
RBC: 4 MIL/uL — ABNORMAL LOW (ref 4.22–5.81)
RDW: 14.6 % (ref 11.5–15.5)
WBC Count: 6.4 10*3/uL (ref 4.0–10.5)
nRBC: 0 % (ref 0.0–0.2)

## 2023-08-01 LAB — LACTATE DEHYDROGENASE: LDH: 134 U/L (ref 98–192)

## 2023-08-01 MED ORDER — DENOSUMAB 120 MG/1.7ML ~~LOC~~ SOLN
120.0000 mg | Freq: Once | SUBCUTANEOUS | Status: AC
Start: 2023-08-01 — End: 2023-08-01
  Administered 2023-08-01: 120 mg via SUBCUTANEOUS
  Filled 2023-08-01: qty 1.7

## 2023-08-02 LAB — KAPPA/LAMBDA LIGHT CHAINS
Kappa free light chain: 19 mg/L (ref 3.3–19.4)
Kappa, lambda light chain ratio: 1.56 (ref 0.26–1.65)
Lambda free light chains: 12.2 mg/L (ref 5.7–26.3)

## 2023-08-14 ENCOUNTER — Inpatient Hospital Stay: Payer: Medicare Other

## 2023-08-14 ENCOUNTER — Inpatient Hospital Stay: Payer: Medicare Other | Admitting: Hematology and Oncology

## 2023-08-14 LAB — MULTIPLE MYELOMA PANEL, SERUM
Albumin SerPl Elph-Mcnc: 3.4 g/dL (ref 2.9–4.4)
Albumin/Glob SerPl: 1.3 (ref 0.7–1.7)
Alpha 1: 0.3 g/dL (ref 0.0–0.4)
Alpha2 Glob SerPl Elph-Mcnc: 0.8 g/dL (ref 0.4–1.0)
B-Globulin SerPl Elph-Mcnc: 0.9 g/dL (ref 0.7–1.3)
Gamma Glob SerPl Elph-Mcnc: 0.7 g/dL (ref 0.4–1.8)
Globulin, Total: 2.7 g/dL (ref 2.2–3.9)
IgA: 277 mg/dL (ref 61–437)
IgG (Immunoglobin G), Serum: 758 mg/dL (ref 603–1613)
IgM (Immunoglobulin M), Srm: 72 mg/dL (ref 15–143)
Total Protein ELP: 6.1 g/dL (ref 6.0–8.5)

## 2023-08-19 ENCOUNTER — Other Ambulatory Visit: Payer: Self-pay | Admitting: Hematology and Oncology

## 2023-08-19 DIAGNOSIS — C9000 Multiple myeloma not having achieved remission: Secondary | ICD-10-CM

## 2023-08-20 ENCOUNTER — Other Ambulatory Visit: Payer: Self-pay | Admitting: *Deleted

## 2023-08-20 DIAGNOSIS — C9000 Multiple myeloma not having achieved remission: Secondary | ICD-10-CM

## 2023-08-20 MED ORDER — LENALIDOMIDE 10 MG PO CAPS: 10.0000 mg | ORAL_CAPSULE | Freq: Every day | ORAL | 0 refills | Status: DC

## 2023-08-22 ENCOUNTER — Inpatient Hospital Stay: Payer: Medicare Other

## 2023-08-22 ENCOUNTER — Telehealth: Payer: Self-pay | Admitting: Hematology and Oncology

## 2023-08-22 ENCOUNTER — Ambulatory Visit: Payer: Medicare Other | Admitting: Nurse Practitioner

## 2023-08-24 ENCOUNTER — Ambulatory Visit: Payer: Medicare Other | Admitting: Podiatry

## 2023-08-26 NOTE — Progress Notes (Deleted)
 Patient Care Team: [name], MD as PCP - General (Family Medicine) Jeffrie Oneil BROCKS, MD as PCP - Cardiology (Cardiology) Rubin Calamity, MD as Consulting Physician (General Surgery)   CHIEF COMPLAINT: Follow up Multiple Myeloma   CURRENT THERAPY: Maintenance Revlimid  since 08/2022  INTERVAL HISTORY Wesley [name] returns for follow up as scheduled. Last seen by Dr. [name] 05/23/23. He continues maintenance Revlimid .   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 PT denies significant findings   CARDIAC CATHETERIZATION  2008   COLOSTOMY N/A 10/31/2019   Procedure: COLOSTOMY;  Surgeon: Vanderbilt Ned, MD;  Location: [facility];  Service: General;  Laterality: N/A;   CYSTOSCOPY W/ URETERAL STENT PLACEMENT Bilateral 10/31/2019   Procedure: CYSTOSCOPY URETERAL STENT PLACEMENT BILATERAL;  Surgeon: Matilda Garnette, MD;  Location: [facility];  Service: Urology;  Laterality: Bilateral;   CYSTOSCOPY WITH STENT PLACEMENT Bilateral 03/11/2020   Procedure: CYSTOSCOPY WITH BILATERAL FIREFLY INJECTION;  Surgeon:  Watt Rush, MD;  Location: [facility];  Service: Urology;  Laterality: Bilateral;   EYE SURGERY     BILATERAL CATARACT SURGERY WITH LENS IMPLANTS   FLEXIBLE SIGMOIDOSCOPY N/A 10/29/2019   Procedure: FLEXIBLE SIGMOIDOSCOPY;  Surgeon: Albertus Gordy HERO, MD;  Location: [facility];  Service: Gastroenterology;  Laterality: N/A;   INCISIONAL HERNIA REPAIR N/A 10/13/2020   Procedure: OPEN INCISIONAL HERNIA REPAIR WITH MESH;  Surgeon: Rubin Calamity, MD;  Location: [facility];  Service: General;  Laterality: N/A;   INSERTION OF MESH N/A 09/08/2016   Procedure: INSERTION OF MESH;  Surgeon: Krystal Russell, MD;  Location: [facility];  Service: General;  Laterality: N/A;   IR RADIOLOGIST EVAL & MGMT  11/10/2020   IR RADIOLOGIST EVAL & MGMT  11/25/2020   LAPAROSCOPIC SIGMOID COLECTOMY N/A 10/31/2019   Procedure: DIAGNOSTIC LAPAROSCOPY; EXPLORATORY LAPAROTOMY; SIGMOID COLECTOMY;  Surgeon: Vanderbilt Ned, MD;  Location: [facility];  Service: General;  Laterality: N/A;   right rotator cuff     SUBMUCOSAL TATTOO INJECTION  10/29/2019   Procedure: SUBMUCOSAL TATTOO INJECTION;  Surgeon: Albertus Gordy HERO, MD;  Location: [facility];  Service: Gastroenterology;;   TONSILLECTOMY     UMBILICAL HERNIA REPAIR N/A 09/08/2016   Procedure: OPEN UMBILICAL HERNIA REPAIR WITH MESH;  Surgeon: Krystal Russell, MD;  Location: [facility];  Service: General;  Laterality: N/A;   VENTRAL HERNIA REPAIR N/A 02/03/2021   Procedure: LAPAROSCOPIC VENTRAL HERNIA REPAIR WITH MESH;  Surgeon: Rubin Calamity, MD;  Location: [facility];  Service: General;  Laterality:  N/A;   XI ROBOTIC ASSISTED COLOSTOMY TAKEDOWN N/A 03/11/2020   Procedure: ROBOTIC ASSISTED COLOSTOMY REVERSAL, RIGID PROCTOSCOPY;  Surgeon: Debby Hila, MD;  Location: [facility];  Service: General;  Laterality: N/A;     Outpatient Encounter Medications as of 08/28/2023  Medication Sig   acetaminophen  (TYLENOL ) 500 MG tablet Take 500 mg by mouth every 6 (six) hours as needed.   acyclovir  (ZOVIRAX ) 400 MG  tablet Take 1 tablet (400 mg total) by mouth in the morning and at bedtime.   allopurinol  (ZYLOPRIM ) 300 MG tablet Take 1 tablet (300 mg total) by mouth daily.   aspirin  EC 81 MG tablet Take 81 mg by mouth daily. Swallow whole.   atorvastatin  (LIPITOR) 80 MG tablet Take 1 tablet (80 mg total) by mouth daily.   Cholecalciferol (VITAMIN D3) 25 MCG (1000 UT) CAPS Take 1,000 Units by mouth daily.   dexamethasone  (DECADRON ) 4 MG tablet Take 5 tablets (20 mg total) by mouth once a week.   fexofenadine (ALLEGRA) 180 MG tablet    finasteride  (PROSCAR ) 5 MG tablet Take 5 mg by mouth daily.   fluticasone  (FLONASE ) 50 MCG/ACT nasal spray Place into the nose.   gabapentin  (NEURONTIN ) 300 MG capsule Take 1 capsule (300 mg total) by mouth at bedtime.   Ibuprofen  200 MG CAPS    lenalidomide  (REVLIMID ) 10 MG capsule Take 1 capsule (10 mg total) by mouth daily. Take 1 capsule daily for 21 days and then none for 7 days.   lisinopril  (ZESTRIL ) 10 MG tablet TAKE 1 TABLET (10 MG TOTAL) BY MOUTH DAILY.   loratadine  (CLARITIN ) 10 MG tablet Take 10 mg by mouth daily.   Magnesium  Oxide 400 MG CAPS    melatonin 5 MG TABS    Omega-3 Fatty Acids (FISH OIL) 1200 MG CAPS Take 1,200 mg by mouth daily.   ondansetron  (ZOFRAN ) 8 MG tablet Take 1 tablet (8 mg total) by mouth every 8 (eight) hours as needed.   pantoprazole (PROTONIX) 40 MG tablet Take by mouth.   prochlorperazine  (COMPAZINE ) 10 MG tablet Take 1 tablet (10 mg total) by mouth every 6 (six) hours as needed for nausea or vomiting.   vitamin B-12 (CYANOCOBALAMIN ) 1000 MCG tablet Take 1,000 mcg by mouth daily.   No facility-administered encounter medications on file as of 08/28/2023.     There were no vitals filed for this visit. There is no height or weight on file to calculate BMI.   PHYSICAL EXAM GENERAL:alert, no distress and comfortable SKIN: no rash  EYES: sclera clear NECK: without mass LYMPH:  no palpable cervical or supraclavicular lymphadenopathy   LUNGS: clear with normal breathing effort HEART: regular rate & rhythm, no lower extremity edema ABDOMEN: abdomen soft, non-tender and normal bowel sounds NEURO: alert & oriented x 3 with fluent speech, no focal motor/sensory deficits Breast exam:  PAC without erythema    CBC    Component Value Date/Time   WBC 6.4 08/01/2023 1406   WBC 4.9 08/01/2021 1117   RBC 4.00 (L) 08/01/2023 1406   HGB 14.3 08/01/2023 1406   HGB 12.8 (L) 08/01/2021 1117   HCT 41.4 08/01/2023 1406   HCT 36.3 (L) 10/28/2021 1035   PLT 249 08/01/2023 1406   PLT 184 08/01/2021 1117   MCV 103.5 (H) 08/01/2023 1406   MCV 101 (H) 08/01/2021 1117   MCH 35.8 (H) 08/01/2023 1406   MCHC 34.5 08/01/2023 1406   RDW 14.6 08/01/2023 1406   RDW 12.9 08/01/2021 1117   LYMPHSABS 1.1 08/01/2023  1406   LYMPHSABS 2.0 08/01/2021 1117   MONOABS 0.5 08/01/2023 1406   EOSABS 0.3 08/01/2023 1406   EOSABS 0.1 08/01/2021 1117   BASOSABS 0.1 08/01/2023 1406   BASOSABS 0.0 08/01/2021 1117     CMP     Component Value Date/Time   NA 139 08/01/2023 1406   K 3.8 08/01/2023 1406   CL 107 08/01/2023 1406   CO2 26 08/01/2023 1406   GLUCOSE 96 08/01/2023 1406   BUN 13 08/01/2023 1406   CREATININE 0.84 08/01/2023 1406   CALCIUM  8.9 08/01/2023 1406   PROT 6.3 (L) 08/01/2023 1406   PROT 10.4 (HH) 08/01/2021 1117   ALBUMIN 3.9 08/01/2023 1406   AST 16 08/01/2023 1406   ALT 15 08/01/2023 1406   ALKPHOS 95 08/01/2023 1406   BILITOT 1.0 08/01/2023 1406   GFRNONAA >60 08/01/2023 1406   GFRAA >60 03/14/2020 0553     ASSESSMENT & PLAN: Wesley [name] is a 83 y.o. male    # IgG Kappa Multiple Myeloma -Patient meets diagnostic criteria for multiple myeloma based on the presence of 60% plasma cells in the bone marrow -Pretreatment phenotype RBC as well as type and screen ordered  -Cycle 1 Day 1of Dara/Rev/Dex started on 09/30/2021. He started without revlimid  which was added on 10/06/2021 (Cycle 1 Day 8)  -Transitioned to  maintenance Revlimid  on 08/17/2022.   PLAN:  No orders of the defined types were placed in this encounter.     All questions were answered. The patient knows to call the clinic with any problems, questions or concerns. No barriers to learning were detected. I spent *** counseling the patient face to face. The total time spent in the appointment was *** and more than 50% was on counseling, review of test results, and coordination of care.   Briscoe Daniello, NP-C @DATE @

## 2023-08-27 ENCOUNTER — Telehealth: Payer: Self-pay | Admitting: Hematology and Oncology

## 2023-08-27 ENCOUNTER — Telehealth: Payer: Self-pay | Admitting: *Deleted

## 2023-08-27 DIAGNOSIS — L570 Actinic keratosis: Secondary | ICD-10-CM | POA: Diagnosis not present

## 2023-08-27 DIAGNOSIS — D485 Neoplasm of uncertain behavior of skin: Secondary | ICD-10-CM | POA: Diagnosis not present

## 2023-08-27 DIAGNOSIS — Z85828 Personal history of other malignant neoplasm of skin: Secondary | ICD-10-CM | POA: Diagnosis not present

## 2023-08-27 DIAGNOSIS — C44329 Squamous cell carcinoma of skin of other parts of face: Secondary | ICD-10-CM | POA: Diagnosis not present

## 2023-08-27 NOTE — Telephone Encounter (Signed)
 Received call from pt requesting to cancel his appts for tomorrow, 08/28/23 and get re-scheduled later in the week or the beginning of next week. Scheduling message sent to [facility].

## 2023-08-28 ENCOUNTER — Inpatient Hospital Stay: Payer: Medicare Other

## 2023-08-28 ENCOUNTER — Inpatient Hospital Stay: Payer: Medicare Other | Admitting: Nurse Practitioner

## 2023-08-29 ENCOUNTER — Inpatient Hospital Stay (HOSPITAL_BASED_OUTPATIENT_CLINIC_OR_DEPARTMENT_OTHER): Payer: Medicare Other | Admitting: Hematology and Oncology

## 2023-08-29 ENCOUNTER — Inpatient Hospital Stay: Payer: Medicare Other

## 2023-08-29 ENCOUNTER — Ambulatory Visit: Payer: Medicare Other | Admitting: Nurse Practitioner

## 2023-08-29 ENCOUNTER — Inpatient Hospital Stay: Payer: Medicare Other | Attending: Hematology and Oncology

## 2023-08-29 VITALS — BP 141/88 | HR 76 | Temp 97.6°F | Resp 16 | Wt 192.3 lb

## 2023-08-29 DIAGNOSIS — C9001 Multiple myeloma in remission: Secondary | ICD-10-CM | POA: Diagnosis not present

## 2023-08-29 DIAGNOSIS — Z7982 Long term (current) use of aspirin: Secondary | ICD-10-CM | POA: Diagnosis not present

## 2023-08-29 DIAGNOSIS — Z79899 Other long term (current) drug therapy: Secondary | ICD-10-CM | POA: Insufficient documentation

## 2023-08-29 DIAGNOSIS — G629 Polyneuropathy, unspecified: Secondary | ICD-10-CM | POA: Diagnosis not present

## 2023-08-29 DIAGNOSIS — C9000 Multiple myeloma not having achieved remission: Secondary | ICD-10-CM | POA: Insufficient documentation

## 2023-08-29 LAB — CMP (CANCER CENTER ONLY)
ALT: 22 U/L (ref 0–44)
AST: 20 U/L (ref 15–41)
Albumin: 4.2 g/dL (ref 3.5–5.0)
Alkaline Phosphatase: 75 U/L (ref 38–126)
Anion gap: 7 (ref 5–15)
BUN: 16 mg/dL (ref 8–23)
CO2: 26 mmol/L (ref 22–32)
Calcium: 9.3 mg/dL (ref 8.9–10.3)
Chloride: 107 mmol/L (ref 98–111)
Creatinine: 0.93 mg/dL (ref 0.61–1.24)
GFR, Estimated: >60 mL/min (ref >60)
Glucose, Bld: 89 mg/dL (ref 70–99)
Potassium: 3.9 mmol/L (ref 3.5–5.1)
Sodium: 140 mmol/L (ref 135–145)
Total Bilirubin: 1.3 mg/dL — ABNORMAL HIGH (ref 0.0–1.2)
Total Protein: 6.9 g/dL (ref 6.5–8.1)

## 2023-08-29 LAB — CBC WITH DIFFERENTIAL (CANCER CENTER ONLY)
Abs Immature Granulocytes: 0.03 10*3/uL (ref 0.00–0.07)
Basophils Absolute: 0.1 10*3/uL (ref 0.0–0.1)
Basophils Relative: 3 %
Eosinophils Absolute: 0.2 10*3/uL (ref 0.0–0.5)
Eosinophils Relative: 5 %
HCT: 44.2 % (ref 39.0–52.0)
Hemoglobin: 15 g/dL (ref 13.0–17.0)
Immature Granulocytes: 1 %
Lymphocytes Relative: 27 %
Lymphs Abs: 1.2 10*3/uL (ref 0.7–4.0)
MCH: 35.3 pg — ABNORMAL HIGH (ref 26.0–34.0)
MCHC: 33.9 g/dL (ref 30.0–36.0)
MCV: 104 fL — ABNORMAL HIGH (ref 80.0–100.0)
Monocytes Absolute: 0.7 10*3/uL (ref 0.1–1.0)
Monocytes Relative: 16 %
Neutro Abs: 2.2 10*3/uL (ref 1.7–7.7)
Neutrophils Relative %: 48 %
Platelet Count: 138 10*3/uL — ABNORMAL LOW (ref 150–400)
RBC: 4.25 MIL/uL (ref 4.22–5.81)
RDW: 14.7 % (ref 11.5–15.5)
WBC Count: 4.5 10*3/uL (ref 4.0–10.5)
nRBC: 0 % (ref 0.0–0.2)

## 2023-08-29 LAB — LACTATE DEHYDROGENASE: LDH: 148 U/L (ref 98–192)

## 2023-08-29 MED ORDER — DENOSUMAB 120 MG/1.7ML ~~LOC~~ SOLN
120.0000 mg | Freq: Once | SUBCUTANEOUS | Status: AC
  Administered 2023-08-29: 120 mg via SUBCUTANEOUS

## 2023-08-29 NOTE — Progress Notes (Signed)
 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 Further workup showed SPEP M-Spike 4.2 g/dL with IgG-Kappa as monoclonal protein, IgG 5908 mg/dL 4/54/0981: Initial evaluation at [facility]. bone marrow biopsy performed, showed 60% abnormal plasma cells in 60% cellular marrow. 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  09/26/2021: transfer care to Dr. Rosaline Coma at [facility].  09/30/2021: Cycle 1 Day 1 of Dara/Dex 10/06/2021: Cycle 1 Day 8 (added revlimid ) 10/28/2021: Cycle 2 Day 1 of Dara/Rev/Dex 11/25/2021: Cycle 3 Day 1 of Dara/Rev/Dex 5/12/023: Cycle 4 Day 1 of Dara/Rev/Dex 01/20/2022: Cycle 5 Day 1 of Dara/Rev/Dex 02/17/2022: Cycle 6 Day 1 of Dara/Rev/Dex 03/17/2022: Cycle 7 Day 1 of Dara/Rev/Dex 04/14/2022: Cycle 8 Day 1 of Dara/Rev/Dex 05/22/2022: Cycle 9 Day 1 of Dara/Rev/Dex 06/19/2022: Cycle 10 Day 1 of Dara/Rev/Dex 07/17/2022: Cycle 11 Day 1 of Dara/Rev/Dex 08/17/2022: start maintenance revlimid  10 mg PO daily 21 of 28 days.   Interval History:  Wesley [name] 83 y.o. male with medical history significant for IgG kappa multiple myeloma who presents for a follow up visit. The patient's last visit was on 05/23/2023. In the interim since the last visit he continued treatment with maintenance revlimid .   On exam today Wesley [name] reports he is alone today because his wife is at the hairdresser.  He reports that he did have a cold that lasted over  4 weeks resulting in hide if the sinus drainage, coughing, and poor sleep.  He reports he was not febrile.  He notes that he was checked for COVID both at home and at the doctor's and found be negative both times.  He reports he is now back to 100% after that infection.  He reports that he is also trying to go back to physical therapy to build up his strength.  He reports he had to stop without 4 weeks where he was ill.  He notes he is tolerating his Revlimid  well with no major side effects.  He is taking his aspirin  with it and not having any bleeding, bruising, or dark stools.  He reports his appetite and energy have both been strong.  He has been undergoing some skin lesion removal with his dermatologist.  He reports he "was under the knife" yesterday.  Overall he is willing and able to continue on Xgeva  therapy as well as Revlimid .  He denies fevers, chills, night sweats, shortness of breath, chest pain or cough. He has no other complaints. Full 10 point ROS is listed below.  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 PT denies significant findings   CARDIAC CATHETERIZATION  2008   COLOSTOMY N/A 10/31/2019   Procedure: COLOSTOMY;  Surgeon: Sim Dryer, MD;  Location: [facility];  Service: General;  Laterality: N/A;   CYSTOSCOPY W/ URETERAL STENT PLACEMENT Bilateral 10/31/2019   Procedure: CYSTOSCOPY URETERAL STENT PLACEMENT BILATERAL;  Surgeon: Trent Frizzle, MD;  Location: [facility];  Service: Urology;  Laterality: Bilateral;   CYSTOSCOPY WITH STENT PLACEMENT Bilateral 03/11/2020   Procedure: CYSTOSCOPY WITH BILATERAL FIREFLY INJECTION;  Surgeon: Homero Luster, MD;  Location: [facility];  Service: Urology;  Laterality: Bilateral;   EYE SURGERY     BILATERAL CATARACT SURGERY WITH LENS IMPLANTS   FLEXIBLE SIGMOIDOSCOPY N/A 10/29/2019   Procedure: FLEXIBLE SIGMOIDOSCOPY;  Surgeon: Nannette Babe, MD;  Location: [facility];  Service: Gastroenterology;  Laterality: N/A;   INCISIONAL HERNIA REPAIR N/A 10/13/2020   Procedure: OPEN INCISIONAL HERNIA REPAIR WITH MESH;  Surgeon: Shela Derby, MD;  Location: [facility] OR;  Service: General;  Laterality: N/A;   INSERTION OF MESH N/A 09/08/2016   Procedure: INSERTION OF MESH;  Surgeon: Adalberto Hollow, MD;  Location: [facility];  Service: General;  Laterality: N/A;   IR RADIOLOGIST EVAL & MGMT  11/10/2020   IR RADIOLOGIST EVAL & MGMT  11/25/2020   LAPAROSCOPIC SIGMOID COLECTOMY N/A 10/31/2019   Procedure: DIAGNOSTIC LAPAROSCOPY; EXPLORATORY LAPAROTOMY; SIGMOID COLECTOMY;  Surgeon: Sim Dryer, MD;  Location: [facility];  Service: General;  Laterality: N/A;   right rotator cuff     SUBMUCOSAL TATTOO INJECTION  10/29/2019   Procedure: SUBMUCOSAL TATTOO INJECTION;  Surgeon: Nannette Babe, MD;  Location: [facility];  Service: Gastroenterology;;   TONSILLECTOMY     UMBILICAL HERNIA REPAIR N/A 09/08/2016   Procedure: OPEN UMBILICAL HERNIA REPAIR WITH MESH;  Surgeon: Adalberto Hollow, MD;  Location: [facility];  Service: General;  Laterality: N/A;   VENTRAL HERNIA  REPAIR N/A 02/03/2021   Procedure: LAPAROSCOPIC VENTRAL HERNIA REPAIR WITH MESH;  Surgeon: Shela Derby, MD;  Location: [facility] OR;  Service: General;  Laterality: N/A;   XI ROBOTIC ASSISTED COLOSTOMY TAKEDOWN N/A 03/11/2020   Procedure: ROBOTIC ASSISTED COLOSTOMY REVERSAL, RIGID PROCTOSCOPY;  Surgeon: Joyce Nixon, MD;  Location: [facility];  Service: General;  Laterality: N/A;    SOCIAL HISTORY: Social History   Socioeconomic History   Marital status: Married    Spouse name: Godfrey Mccreery    Number of children: 2   Years of education: 12   Highest education level: Master's degree (e.g., MA, MS, MEng, MEd, MSW, MBA)  Occupational History   Occupation: Retired   Tobacco Use   Smoking status: Never   Smokeless tobacco: Never  Vaping Use   Vaping status: Never Used  Substance and Sexual Activity   Alcohol use: Yes    Alcohol/week: 17.0 standard drinks of alcohol    Types: 3 Glasses of wine, 14 Standard drinks or equivalent per week   Drug use: No   Sexual activity: Not  Currently  Other Topics Concern   Not on file  Social History Narrative   Married. 2 children 52 in 48 in 2022- both went to [employer] [facility]. 5 grandkids- all girls from oldest 70 looking premed [employer], 3 soph [place], 2 76 year olds, 10 year olds.       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 [employer]: fishing, [employer] football and basketball, [employer], yardwork   Social Drivers of Health   Financial Resource Strain: Low Risk  (07/20/2023)   Overall Financial Resource Strain (CARDIA)    Difficulty of Paying Living Expenses: Not hard at all  Food Insecurity: No Food Insecurity (07/20/2023)   Hunger Vital Sign    Worried About Programme researcher, broadcasting/film/video in the Last Year: Never true    Ran Out of Food in the Last Year: Never true  Transportation Needs: No Transportation Needs (07/20/2023)   PRAPARE -  Administrator, Civil Service (Medical): No    Lack of Transportation (Non-Medical): No  Physical Activity: Insufficiently Active (07/20/2023)   Exercise Vital Sign    Days of Exercise per Week: 2 days    Minutes of Exercise per Session: 60 min  Stress: No Stress Concern Present (07/20/2023)   [employer] of Occupational Health - Occupational Stress Questionnaire    Feeling of Stress : Only a little  Social Connections: Socially Integrated (07/20/2023)   Social Connection and Isolation Panel [NHANES]    Frequency of Communication with Friends and Family: Three times a week    Frequency of Social Gatherings with Friends and Family: Once a week    Attends Religious Services: More than 4 times per year    Active Member of [employer] or Organizations: Yes    Attends Engineer, structural: More than 4 times per year    Marital Status: Married  Catering manager Violence: Not At Risk (05/08/2022)   Humiliation, Afraid, Rape, and Kick questionnaire    Fear of Current or Ex-Partner: No    Emotionally Abused: No    Physically Abused: No    Sexually Abused: No    FAMILY HISTORY: Family History  Problem Relation Age of Onset   Diabetes Mother    Heart failure Mother        around 76   CAD Father        31   Melanoma Brother 66   Healthy Daughter    Healthy Son    Colon cancer Neg Hx    Esophageal cancer Neg Hx    Rectal cancer Neg Hx    Stomach cancer Neg Hx     ALLERGIES:  is allergic to demerol, promethazine  hcl, and ativan  [lorazepam ].  MEDICATIONS:  Current Outpatient Medications  Medication Sig Dispense Refill   acetaminophen  (TYLENOL ) 500 MG tablet Take 500 mg by mouth every 6 (six) hours as needed.     acyclovir  (ZOVIRAX ) 400 MG tablet Take 1 tablet (400 mg total) by mouth in the morning and at bedtime. 60 tablet 2   allopurinol  (ZYLOPRIM ) 300 MG tablet Take 1 tablet (300 mg total) by mouth daily. 60 tablet 2   aspirin  EC 81 MG tablet Take 81 mg by mouth  daily. Swallow whole.     atorvastatin  (LIPITOR) 80 MG tablet Take 1 tablet (80 mg total) by mouth daily. 15 tablet 0   Cholecalciferol (  VITAMIN D3) 25 MCG (1000 UT) CAPS Take 1,000 Units by mouth daily.     dexamethasone  (DECADRON ) 4 MG tablet Take 5 tablets (20 mg total) by mouth once a week. 20 tablet 3   fexofenadine (ALLEGRA) 180 MG tablet      finasteride  (PROSCAR ) 5 MG tablet Take 5 mg by mouth daily.     fluticasone  (FLONASE ) 50 MCG/ACT nasal spray Place into the nose.     gabapentin  (NEURONTIN ) 300 MG capsule Take 1 capsule (300 mg total) by mouth at bedtime. 30 capsule 0   Ibuprofen  200 MG CAPS      lenalidomide  (REVLIMID ) 10 MG capsule Take 1 capsule (10 mg total) by mouth daily. Take 1 capsule daily for 21 days and then none for 7 days. 21 capsule 0   lisinopril  (ZESTRIL ) 10 MG tablet TAKE 1 TABLET (10 MG TOTAL) BY MOUTH DAILY. 90 tablet 3   loratadine  (CLARITIN ) 10 MG tablet Take 10 mg by mouth daily.     Magnesium  Oxide 400 MG CAPS      melatonin 5 MG TABS      Omega-3 Fatty Acids (FISH OIL) 1200 MG CAPS Take 1,200 mg by mouth daily.     ondansetron  (ZOFRAN ) 8 MG tablet Take 1 tablet (8 mg total) by mouth every 8 (eight) hours as needed. 30 tablet 0   pantoprazole (PROTONIX) 40 MG tablet Take by mouth.     prochlorperazine  (COMPAZINE ) 10 MG tablet Take 1 tablet (10 mg total) by mouth every 6 (six) hours as needed for nausea or vomiting. 30 tablet 0   vitamin B-12 (CYANOCOBALAMIN ) 1000 MCG tablet Take 1,000 mcg by mouth daily.     No current facility-administered medications for this visit.    REVIEW OF SYSTEMS:   Constitutional: ( - ) fevers, ( - )  chills , ( - ) night sweats Eyes: ( - ) blurriness of vision, ( - ) double vision, ( - ) watery eyes Ears, nose, mouth, throat, and face: ( - ) mucositis, ( - ) sore throat Respiratory: ( - ) cough, ( - ) dyspnea, ( - ) wheezes Cardiovascular: ( - ) palpitation, ( - ) chest discomfort, ( - ) lower extremity  swelling Gastrointestinal:  ( - ) nausea, ( - ) heartburn, ( - ) change in bowel habits Skin: ( - ) abnormal skin rashes Lymphatics: ( - ) new lymphadenopathy, ( - ) easy bruising Neurological: ( + ) numbness, ( - ) tingling, ( - ) new weaknesses Behavioral/Psych: ( - ) mood change, ( - ) new changes  All other systems were reviewed with the patient and are negative.  PHYSICAL EXAMINATION: ECOG PERFORMANCE STATUS: 0 - Asymptomatic  Vitals:   08/29/23 1540  BP: (!) 141/88  Pulse: 76  Resp: 16  Temp: 97.6 F (36.4 C)  SpO2: 100%       Filed Weights   08/29/23 1540  Weight: 192 lb 4.8 oz (87.2 kg)     GENERAL: Well-appearing elderly Caucasian male, alert, no distress and comfortable SKIN: skin color, texture, turgor are normal, no rashes or significant lesions EYES: conjunctiva are pink and non-injected, sclera clear LUNGS: clear to auscultation and percussion with normal breathing effort HEART: regular rate & rhythm and no murmurs and no lower extremity edema Musculoskeletal: no cyanosis of digits and no clubbing  PSYCH: alert & oriented x 3, fluent speech NEURO: no focal motor/sensory deficits  LABORATORY DATA:  I have reviewed the data as listed  Latest Ref Rng & Units 08/29/2023    3:32 PM 08/01/2023    2:06 PM 06/20/2023    3:05 PM  CBC  WBC 4.0 - 10.5 K/uL 4.5  6.4  4.8   Hemoglobin 13.0 - 17.0 g/dL 09.8  11.9  14.7   Hematocrit 39.0 - 52.0 % 44.2  41.4  42.0   Platelets 150 - 400 K/uL 138  249  183        Latest Ref Rng & Units 08/29/2023    3:32 PM 08/01/2023    2:06 PM 06/20/2023    3:05 PM  CMP  Glucose 70 - 99 mg/dL 89  96  829   BUN 8 - 23 mg/dL 16  13  19    Creatinine 0.61 - 1.24 mg/dL 5.62  1.30  8.65   Sodium 135 - 145 mmol/L 140  139  142   Potassium 3.5 - 5.1 mmol/L 3.9  3.8  4.0   Chloride 98 - 111 mmol/L 107  107  108   CO2 22 - 32 mmol/L 26  26  28    Calcium  8.9 - 10.3 mg/dL 9.3  8.9  9.0   Total Protein 6.5 - 8.1 g/dL 6.9  6.3  6.5    Total Bilirubin 0.0 - 1.2 mg/dL 1.3  1.0  0.9   Alkaline Phos 38 - 126 U/L 75  95  68   AST 15 - 41 U/L 20  16  21    ALT 0 - 44 U/L 22  15  28      Lab Results  Component Value Date   MPROTEIN Not Observed 08/01/2023   MPROTEIN Not Observed 06/20/2023   MPROTEIN Not Observed 05/23/2023   Lab Results  Component Value Date   KPAFRELGTCHN 19.0 08/01/2023   KPAFRELGTCHN 18.5 06/20/2023   KPAFRELGTCHN 13.9 05/23/2023   LAMBDASER 12.2 08/01/2023   LAMBDASER 13.6 06/20/2023   LAMBDASER 10.8 05/23/2023   KAPLAMBRATIO 1.56 08/01/2023   KAPLAMBRATIO 1.36 06/20/2023   KAPLAMBRATIO 1.29 05/23/2023    RADIOGRAPHIC STUDIES: No results found.  ASSESSMENT & PLAN Wesley [name] is a 83 y.o. male with medical history significant for IgG kappa multiple myeloma who presents for a follow up visit.   # IgG Kappa Multiple Myeloma --Patient meets diagnostic criteria for multiple myeloma based on the presence of 60% plasma cells in the bone marrow --Pretreatment phenotype RBC as well as type and screen ordered  --Cycle 1 Day 1of Dara/Rev/Dex started on 09/30/2021. He started without revlimid  which was added on 10/06/2021 (Cycle 1 Day 8)  --Transitioned to maintenance Revlimid  on 08/17/2022.  Plan:  --Currently on maintenance revlimid  10 mg PO daily 21 of 28 days.  --Labs today showed WBC 4.5, hemoglobin 15.0, MCV 104, platelets 138. Creatinine and LFTs normal. Myeloma labs pending today --Most recent myeloma labs from 08/01/2023 did not detect M protein with normal sFLC. -- Return to clinic in 4 weeks for labs/Xgeva  and in 3 months for a repeat clinic visit   #Lethargy 2/2 Zometa :  --Discussed alternative which would be Xgeva  q 28 days.  -- continue monthly Xgeva .    # Boils on Abdomen-stable -- Patient underwent I&D and subsequent antibiotic treatment with no resolution.  2 lesions have developed at the site of his prior ostomy.  A new lesion was present on 06/19/2022. --Patient was seen by  surgery who ordered CT scan on 01/31/2022 that showed no fluid collection in the anterior abdominal wall. Instructed patient on how to pack wounds.  --  No systemic symptoms, okay to continue with systemic chemotherapy. --continue to follow with surgery.   # Right hand neuropathy:  --Suspect carpal tunnel so recommend wearing a wrist brace.  --follows with [facility], next visit Sept 2025  #Supportive Care -- chemotherapy education complete  -- port placement not required.  --ASA 81 mg p.o. daily for thromboprophylaxis on Revlimid . -- zofran  8mg  q8H PRN and compazine  10mg  PO q6H for nausea -- zometa  clearance obtained, started on 03/03/2022. Had lethargy with zometa , will be converting to Xgeva .  Continue monthly injections or Xgeva  x 2 years (July 2025).  -- no pain medication required at this time.   No orders of the defined types were placed in this encounter.  All questions were answered. The patient knows to call the clinic with any problems, questions or concerns.  I have spent a total of 30 minutes minutes of face-to-face and non-face-to-face time, preparing to see the patient, performing a medically appropriate examination, counseling and educating the patient, ordering medications/tests, documenting clinical information in the electronic health record,  and care coordination.   Rogerio Clay, MD Department of Hematology/Oncology [facility] Phone: [phone] Pager: [phone] Email: [email or web address]  08/29/2023 4:04 PM

## 2023-08-30 ENCOUNTER — Telehealth: Payer: Self-pay | Admitting: Hematology and Oncology

## 2023-08-30 NOTE — Telephone Encounter (Signed)
Scheduled appointments per 1/15 Los notes. Patient is aware of the made appointments and is active on MyChart.

## 2023-08-31 DIAGNOSIS — R531 Weakness: Secondary | ICD-10-CM | POA: Diagnosis not present

## 2023-08-31 DIAGNOSIS — R269 Unspecified abnormalities of gait and mobility: Secondary | ICD-10-CM | POA: Diagnosis not present

## 2023-08-31 DIAGNOSIS — M6281 Muscle weakness (generalized): Secondary | ICD-10-CM | POA: Diagnosis not present

## 2023-08-31 LAB — KAPPA/LAMBDA LIGHT CHAINS
Kappa free light chain: 19.3 mg/L (ref 3.3–19.4)
Kappa, lambda light chain ratio: 1.56 (ref 0.26–1.65)
Lambda free light chains: 12.4 mg/L (ref 5.7–26.3)

## 2023-09-04 ENCOUNTER — Ambulatory Visit: Payer: Medicare Other | Admitting: Podiatry

## 2023-09-06 LAB — MULTIPLE MYELOMA PANEL, SERUM
Albumin SerPl Elph-Mcnc: 3.7 g/dL (ref 2.9–4.4)
Albumin/Glob SerPl: 1.4 (ref 0.7–1.7)
Alpha 1: 0.3 g/dL (ref 0.0–0.4)
Alpha2 Glob SerPl Elph-Mcnc: 0.6 g/dL (ref 0.4–1.0)
B-Globulin SerPl Elph-Mcnc: 1.1 g/dL (ref 0.7–1.3)
Gamma Glob SerPl Elph-Mcnc: 0.7 g/dL (ref 0.4–1.8)
Globulin, Total: 2.7 g/dL (ref 2.2–3.9)
IgA: 249 mg/dL (ref 61–437)
IgG (Immunoglobin G), Serum: 800 mg/dL (ref 603–1613)
IgM (Immunoglobulin M), Srm: 52 mg/dL (ref 15–143)
Total Protein ELP: 6.4 g/dL (ref 6.0–8.5)

## 2023-09-11 DIAGNOSIS — R269 Unspecified abnormalities of gait and mobility: Secondary | ICD-10-CM | POA: Diagnosis not present

## 2023-09-11 DIAGNOSIS — M6281 Muscle weakness (generalized): Secondary | ICD-10-CM | POA: Diagnosis not present

## 2023-09-11 DIAGNOSIS — R531 Weakness: Secondary | ICD-10-CM | POA: Diagnosis not present

## 2023-09-12 ENCOUNTER — Telehealth: Payer: Self-pay | Admitting: Podiatry

## 2023-09-12 ENCOUNTER — Inpatient Hospital Stay: Payer: Medicare Other

## 2023-09-12 NOTE — Telephone Encounter (Signed)
Pt left message today at [phone] stating he was scheduled for tomorrow and he is not able to make appt( he thought it was at [phone])   I returned call to cell and left message appt was canceled and to please  call back to get rescheduled

## 2023-09-13 ENCOUNTER — Ambulatory Visit: Payer: Medicare Other | Admitting: Podiatry

## 2023-09-14 ENCOUNTER — Telehealth: Payer: Self-pay | Admitting: *Deleted

## 2023-09-14 NOTE — Telephone Encounter (Signed)
Faxed signed PT orders for patient to Fax # [phone] Fax confirmation received

## 2023-09-18 ENCOUNTER — Other Ambulatory Visit: Payer: Self-pay | Admitting: Hematology and Oncology

## 2023-09-18 DIAGNOSIS — C9000 Multiple myeloma not having achieved remission: Secondary | ICD-10-CM

## 2023-09-19 ENCOUNTER — Encounter: Payer: Self-pay | Admitting: Hematology and Oncology

## 2023-09-19 ENCOUNTER — Inpatient Hospital Stay: Payer: Medicare Other

## 2023-09-19 ENCOUNTER — Other Ambulatory Visit: Payer: Self-pay | Admitting: *Deleted

## 2023-09-19 DIAGNOSIS — C9000 Multiple myeloma not having achieved remission: Secondary | ICD-10-CM

## 2023-09-19 MED ORDER — LENALIDOMIDE 10 MG PO CAPS: 10.0000 mg | ORAL_CAPSULE | Freq: Every day | ORAL | 0 refills | Status: DC

## 2023-09-20 ENCOUNTER — Encounter: Payer: Self-pay | Admitting: Podiatry

## 2023-09-20 ENCOUNTER — Ambulatory Visit (INDEPENDENT_AMBULATORY_CARE_PROVIDER_SITE_OTHER): Payer: Medicare Other | Admitting: Podiatry

## 2023-09-20 VITALS — Ht 68.0 in | Wt 192.0 lb

## 2023-09-20 DIAGNOSIS — M79674 Pain in right toe(s): Secondary | ICD-10-CM | POA: Diagnosis not present

## 2023-09-20 DIAGNOSIS — B351 Tinea unguium: Secondary | ICD-10-CM

## 2023-09-20 DIAGNOSIS — M79675 Pain in left toe(s): Secondary | ICD-10-CM | POA: Diagnosis not present

## 2023-09-20 NOTE — Progress Notes (Signed)
This patient presents to the office with chief complaint of long thick painful nails.  Patient says the nails are painful walking and wearing shoes.  This patient is unable to self treat.  This patient is unable to trim his nails since he is unable to reach his nails.  he presents to the office for preventative foot care services.  General Appearance  Alert, conversant and in no acute stress.  Vascular  Dorsalis pedis and posterior tibial  pulses are palpable  bilaterally.  Capillary return is within normal limits  bilaterally. Temperature is within normal limits  bilaterally.  Neurologic  Senn-Weinstein monofilament wire test within normal limits  bilaterally. Muscle power within normal limits bilaterally.  Nails Thick disfigured discolored nails with subungual debris  from hallux to fifth toes bilaterally. No evidence of bacterial infection or drainage bilaterally.  Orthopedic  No limitations of motion  feet .  No crepitus or effusions noted.  No bony pathology or digital deformities noted.  Hammer toes  B/L 2-5  Skin  normotropic skin with no porokeratosis noted bilaterally.  No signs of infections or ulcers noted.   Asymptomatic clavi  B/L.  Onychomycosis  Nails  B/L.  Pain in right toes  Pain in left toes  Debridement of nails both feet followed trimming the nails with dremel tool.    RTC 3 months.   Helane Gunther DPM

## 2023-09-21 DIAGNOSIS — R531 Weakness: Secondary | ICD-10-CM | POA: Diagnosis not present

## 2023-09-21 DIAGNOSIS — M6281 Muscle weakness (generalized): Secondary | ICD-10-CM | POA: Diagnosis not present

## 2023-09-21 DIAGNOSIS — R269 Unspecified abnormalities of gait and mobility: Secondary | ICD-10-CM | POA: Diagnosis not present

## 2023-09-26 ENCOUNTER — Inpatient Hospital Stay: Payer: Medicare Other | Attending: Hematology and Oncology

## 2023-09-26 ENCOUNTER — Inpatient Hospital Stay: Payer: Medicare Other

## 2023-09-26 DIAGNOSIS — C9000 Multiple myeloma not having achieved remission: Secondary | ICD-10-CM | POA: Diagnosis not present

## 2023-09-26 DIAGNOSIS — Z79899 Other long term (current) drug therapy: Secondary | ICD-10-CM | POA: Insufficient documentation

## 2023-09-26 LAB — CBC WITH DIFFERENTIAL (CANCER CENTER ONLY)
Abs Immature Granulocytes: 0.01 10*3/uL (ref 0.00–0.07)
Basophils Absolute: 0.1 10*3/uL (ref 0.0–0.1)
Basophils Relative: 3 %
Eosinophils Absolute: 0.1 10*3/uL (ref 0.0–0.5)
Eosinophils Relative: 3 %
HCT: 41.9 % (ref 39.0–52.0)
Hemoglobin: 14.5 g/dL (ref 13.0–17.0)
Immature Granulocytes: 0 %
Lymphocytes Relative: 29 %
Lymphs Abs: 1.4 10*3/uL (ref 0.7–4.0)
MCH: 35.5 pg — ABNORMAL HIGH (ref 26.0–34.0)
MCHC: 34.6 g/dL (ref 30.0–36.0)
MCV: 102.4 fL — ABNORMAL HIGH (ref 80.0–100.0)
Monocytes Absolute: 0.5 10*3/uL (ref 0.1–1.0)
Monocytes Relative: 11 %
Neutro Abs: 2.6 10*3/uL (ref 1.7–7.7)
Neutrophils Relative %: 54 %
Platelet Count: 154 10*3/uL (ref 150–400)
RBC: 4.09 MIL/uL — ABNORMAL LOW (ref 4.22–5.81)
RDW: 14.6 % (ref 11.5–15.5)
WBC Count: 4.8 10*3/uL (ref 4.0–10.5)
nRBC: 0 % (ref 0.0–0.2)

## 2023-09-26 LAB — CMP (CANCER CENTER ONLY)
ALT: 24 U/L (ref 0–44)
AST: 22 U/L (ref 15–41)
Albumin: 4 g/dL (ref 3.5–5.0)
Alkaline Phosphatase: 72 U/L (ref 38–126)
Anion gap: 6 (ref 5–15)
BUN: 17 mg/dL (ref 8–23)
CO2: 26 mmol/L (ref 22–32)
Calcium: 8.8 mg/dL — ABNORMAL LOW (ref 8.9–10.3)
Chloride: 108 mmol/L (ref 98–111)
Creatinine: 1.01 mg/dL (ref 0.61–1.24)
GFR, Estimated: >60 mL/min (ref >60)
Glucose, Bld: 110 mg/dL — ABNORMAL HIGH (ref 70–99)
Potassium: 4.3 mmol/L (ref 3.5–5.1)
Sodium: 140 mmol/L (ref 135–145)
Total Bilirubin: 1.1 mg/dL (ref 0.0–1.2)
Total Protein: 6.4 g/dL — ABNORMAL LOW (ref 6.5–8.1)

## 2023-09-26 LAB — LACTATE DEHYDROGENASE: LDH: 144 U/L (ref 98–192)

## 2023-09-26 MED ORDER — DENOSUMAB 120 MG/1.7ML ~~LOC~~ SOLN
120.0000 mg | Freq: Once | SUBCUTANEOUS | Status: AC
  Administered 2023-09-26: 120 mg via SUBCUTANEOUS
  Filled 2023-09-26: qty 1.7

## 2023-09-27 LAB — KAPPA/LAMBDA LIGHT CHAINS
Kappa free light chain: 18.1 mg/L (ref 3.3–19.4)
Kappa, lambda light chain ratio: 1.57 (ref 0.26–1.65)
Lambda free light chains: 11.5 mg/L (ref 5.7–26.3)

## 2023-09-28 DIAGNOSIS — M6281 Muscle weakness (generalized): Secondary | ICD-10-CM | POA: Diagnosis not present

## 2023-09-28 DIAGNOSIS — R531 Weakness: Secondary | ICD-10-CM | POA: Diagnosis not present

## 2023-09-28 DIAGNOSIS — R269 Unspecified abnormalities of gait and mobility: Secondary | ICD-10-CM | POA: Diagnosis not present

## 2023-09-30 LAB — MULTIPLE MYELOMA PANEL, SERUM
Albumin SerPl Elph-Mcnc: 3.5 g/dL (ref 2.9–4.4)
Albumin/Glob SerPl: 1.4 (ref 0.7–1.7)
Alpha 1: 0.2 g/dL (ref 0.0–0.4)
Alpha2 Glob SerPl Elph-Mcnc: 0.6 g/dL (ref 0.4–1.0)
B-Globulin SerPl Elph-Mcnc: 1 g/dL (ref 0.7–1.3)
Gamma Glob SerPl Elph-Mcnc: 0.7 g/dL (ref 0.4–1.8)
Globulin, Total: 2.6 g/dL (ref 2.2–3.9)
IgA: 234 mg/dL (ref 61–437)
IgG (Immunoglobin G), Serum: 799 mg/dL (ref 603–1613)
IgM (Immunoglobulin M), Srm: 42 mg/dL (ref 15–143)
Total Protein ELP: 6.1 g/dL (ref 6.0–8.5)

## 2023-10-01 DIAGNOSIS — Z85828 Personal history of other malignant neoplasm of skin: Secondary | ICD-10-CM | POA: Diagnosis not present

## 2023-10-01 DIAGNOSIS — C44329 Squamous cell carcinoma of skin of other parts of face: Secondary | ICD-10-CM | POA: Diagnosis not present

## 2023-10-08 DIAGNOSIS — C44329 Squamous cell carcinoma of skin of other parts of face: Secondary | ICD-10-CM | POA: Diagnosis not present

## 2023-10-08 DIAGNOSIS — D485 Neoplasm of uncertain behavior of skin: Secondary | ICD-10-CM | POA: Diagnosis not present

## 2023-10-12 ENCOUNTER — Other Ambulatory Visit: Payer: Self-pay | Admitting: Family Medicine

## 2023-10-16 ENCOUNTER — Other Ambulatory Visit: Payer: Self-pay | Admitting: Hematology and Oncology

## 2023-10-16 DIAGNOSIS — C9000 Multiple myeloma not having achieved remission: Secondary | ICD-10-CM

## 2023-10-17 ENCOUNTER — Other Ambulatory Visit: Payer: Self-pay | Admitting: *Deleted

## 2023-10-17 DIAGNOSIS — C9000 Multiple myeloma not having achieved remission: Secondary | ICD-10-CM

## 2023-10-17 MED ORDER — LENALIDOMIDE 10 MG PO CAPS: 10.0000 mg | ORAL_CAPSULE | Freq: Every day | ORAL | 0 refills | Status: DC

## 2023-10-17 NOTE — Telephone Encounter (Signed)
 Already filled

## 2023-10-24 ENCOUNTER — Inpatient Hospital Stay: Payer: Medicare Other

## 2023-10-24 ENCOUNTER — Inpatient Hospital Stay: Payer: Medicare Other | Attending: Hematology and Oncology

## 2023-10-24 ENCOUNTER — Other Ambulatory Visit: Payer: Self-pay | Admitting: Hematology and Oncology

## 2023-10-24 VITALS — BP 137/77 | HR 62 | Temp 97.7°F | Resp 18

## 2023-10-24 DIAGNOSIS — C9001 Multiple myeloma in remission: Secondary | ICD-10-CM

## 2023-10-24 DIAGNOSIS — C9000 Multiple myeloma not having achieved remission: Secondary | ICD-10-CM | POA: Diagnosis not present

## 2023-10-24 LAB — CMP (CANCER CENTER ONLY)
ALT: 26 U/L (ref 0–44)
AST: 22 U/L (ref 15–41)
Albumin: 4.1 g/dL (ref 3.5–5.0)
Alkaline Phosphatase: 67 U/L (ref 38–126)
Anion gap: 6 (ref 5–15)
BUN: 16 mg/dL (ref 8–23)
CO2: 23 mmol/L (ref 22–32)
Calcium: 8.1 mg/dL — ABNORMAL LOW (ref 8.9–10.3)
Chloride: 109 mmol/L (ref 98–111)
Creatinine: 0.99 mg/dL (ref 0.61–1.24)
GFR, Estimated: >60 mL/min (ref >60)
Glucose, Bld: 113 mg/dL — ABNORMAL HIGH (ref 70–99)
Potassium: 4 mmol/L (ref 3.5–5.1)
Sodium: 138 mmol/L (ref 135–145)
Total Bilirubin: 1.1 mg/dL (ref 0.0–1.2)
Total Protein: 6.4 g/dL — ABNORMAL LOW (ref 6.5–8.1)

## 2023-10-24 LAB — CBC WITH DIFFERENTIAL (CANCER CENTER ONLY)
Abs Immature Granulocytes: 0.01 10*3/uL (ref 0.00–0.07)
Basophils Absolute: 0.1 10*3/uL (ref 0.0–0.1)
Basophils Relative: 3 %
Eosinophils Absolute: 0.1 10*3/uL (ref 0.0–0.5)
Eosinophils Relative: 1 %
HCT: 42.2 % (ref 39.0–52.0)
Hemoglobin: 14.8 g/dL (ref 13.0–17.0)
Immature Granulocytes: 0 %
Lymphocytes Relative: 32 %
Lymphs Abs: 1.4 10*3/uL (ref 0.7–4.0)
MCH: 36 pg — ABNORMAL HIGH (ref 26.0–34.0)
MCHC: 35.1 g/dL (ref 30.0–36.0)
MCV: 102.7 fL — ABNORMAL HIGH (ref 80.0–100.0)
Monocytes Absolute: 0.6 10*3/uL (ref 0.1–1.0)
Monocytes Relative: 14 %
Neutro Abs: 2.2 10*3/uL (ref 1.7–7.7)
Neutrophils Relative %: 50 %
Platelet Count: 163 10*3/uL (ref 150–400)
RBC: 4.11 MIL/uL — ABNORMAL LOW (ref 4.22–5.81)
RDW: 15 % (ref 11.5–15.5)
WBC Count: 4.5 10*3/uL (ref 4.0–10.5)
nRBC: 0 % (ref 0.0–0.2)

## 2023-10-24 LAB — LACTATE DEHYDROGENASE: LDH: 183 U/L (ref 98–192)

## 2023-10-24 MED ORDER — DENOSUMAB 120 MG/1.7ML ~~LOC~~ SOLN
120.0000 mg | Freq: Once | SUBCUTANEOUS | Status: AC
Start: 2023-10-24 — End: 2023-10-24
  Administered 2023-10-24: 120 mg via SUBCUTANEOUS
  Filled 2023-10-24: qty 1.7

## 2023-10-24 NOTE — Progress Notes (Signed)
 Ok to proceed with Rivka Barbara today with Ca 8.1 per Dr. Leonides Schanz.  Drusilla Kanner, PharmD, MBA

## 2023-10-25 LAB — KAPPA/LAMBDA LIGHT CHAINS
Kappa free light chain: 17.2 mg/L (ref 3.3–19.4)
Kappa, lambda light chain ratio: 1.34 (ref 0.26–1.65)
Lambda free light chains: 12.8 mg/L (ref 5.7–26.3)

## 2023-10-28 LAB — MULTIPLE MYELOMA PANEL, SERUM
Albumin SerPl Elph-Mcnc: 3.6 g/dL (ref 2.9–4.4)
Albumin/Glob SerPl: 1.5 (ref 0.7–1.7)
Alpha 1: 0.2 g/dL (ref 0.0–0.4)
Alpha2 Glob SerPl Elph-Mcnc: 0.5 g/dL (ref 0.4–1.0)
B-Globulin SerPl Elph-Mcnc: 1 g/dL (ref 0.7–1.3)
Gamma Glob SerPl Elph-Mcnc: 0.7 g/dL (ref 0.4–1.8)
Globulin, Total: 2.5 g/dL (ref 2.2–3.9)
IgA: 234 mg/dL (ref 61–437)
IgG (Immunoglobin G), Serum: 803 mg/dL (ref 603–1613)
IgM (Immunoglobulin M), Srm: 43 mg/dL (ref 15–143)
Total Protein ELP: 6.1 g/dL (ref 6.0–8.5)

## 2023-11-12 DIAGNOSIS — D692 Other nonthrombocytopenic purpura: Secondary | ICD-10-CM | POA: Diagnosis not present

## 2023-11-12 DIAGNOSIS — L821 Other seborrheic keratosis: Secondary | ICD-10-CM | POA: Diagnosis not present

## 2023-11-12 DIAGNOSIS — D0439 Carcinoma in situ of skin of other parts of face: Secondary | ICD-10-CM | POA: Diagnosis not present

## 2023-11-12 DIAGNOSIS — D0471 Carcinoma in situ of skin of right lower limb, including hip: Secondary | ICD-10-CM | POA: Diagnosis not present

## 2023-11-12 DIAGNOSIS — Z85828 Personal history of other malignant neoplasm of skin: Secondary | ICD-10-CM | POA: Diagnosis not present

## 2023-11-12 DIAGNOSIS — D485 Neoplasm of uncertain behavior of skin: Secondary | ICD-10-CM | POA: Diagnosis not present

## 2023-11-15 ENCOUNTER — Encounter: Payer: Self-pay | Admitting: Family Medicine

## 2023-11-15 ENCOUNTER — Ambulatory Visit (INDEPENDENT_AMBULATORY_CARE_PROVIDER_SITE_OTHER): Payer: Medicare Other | Admitting: Family Medicine

## 2023-11-15 VITALS — BP 130/76 | HR 80 | Temp 97.6°F | Ht 68.0 in | Wt 197.8 lb

## 2023-11-15 DIAGNOSIS — E669 Obesity, unspecified: Secondary | ICD-10-CM | POA: Diagnosis not present

## 2023-11-15 DIAGNOSIS — I2510 Atherosclerotic heart disease of native coronary artery without angina pectoris: Secondary | ICD-10-CM

## 2023-11-15 DIAGNOSIS — I10 Essential (primary) hypertension: Secondary | ICD-10-CM

## 2023-11-15 DIAGNOSIS — D333 Benign neoplasm of cranial nerves: Secondary | ICD-10-CM

## 2023-11-15 DIAGNOSIS — Z131 Encounter for screening for diabetes mellitus: Secondary | ICD-10-CM | POA: Diagnosis not present

## 2023-11-15 DIAGNOSIS — F325 Major depressive disorder, single episode, in full remission: Secondary | ICD-10-CM

## 2023-11-15 DIAGNOSIS — E559 Vitamin D deficiency, unspecified: Secondary | ICD-10-CM

## 2023-11-15 DIAGNOSIS — I700 Atherosclerosis of aorta: Secondary | ICD-10-CM | POA: Diagnosis not present

## 2023-11-15 DIAGNOSIS — E538 Deficiency of other specified B group vitamins: Secondary | ICD-10-CM | POA: Diagnosis not present

## 2023-11-15 NOTE — Progress Notes (Signed)
 Phone [phone] In person visit   Subjective:   Wesley [name] is a 83 y.o. year old very pleasant male patient who presents for/with See problem oriented charting Chief Complaint  Patient presents with   Annual Exam    Not fasting. Denies issues/ROS.   Hypertension   Past Medical History-  Patient Active Problem List   Diagnosis Date Noted   S/P hernia repair 10/13/2020    Priority: High   CAD (coronary artery disease) 08/12/2013    Priority: High   B12 deficiency 01/06/2021    Priority: Medium    Vitamin D deficiency 01/06/2021    Priority: Medium    Aortic atherosclerosis (HCC) 01/06/2021    Priority: Medium    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HCC) 01/06/2021    Priority: Medium    BPH associated with nocturia 01/06/2021    Priority: Medium    Abdominal wall abscess 10/28/2020    Priority: Medium    Essential hypertension 03/15/2011    Priority: Medium    Allergic rhinitis 01/06/2021    Priority: Low   Osteoarthritis of glenohumeral joint, left 09/21/2017    Priority: Low   Non-traumatic rotator cuff tear 09/21/2017    Priority: Low   GERD (gastroesophageal reflux disease) 03/15/2011    Priority: Low   Multiple myeloma not having achieved remission (HCC) 09/26/2021   Sleep apnea 05/07/2021   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     aspirin EC 81 MG tablet Take 81 mg by mouth daily. Swallow whole.     atorvastatin (LIPITOR) 80 MG tablet Take 1 tablet (80 mg total) by mouth daily. 15 tablet 0   Cholecalciferol (VITAMIN D3) 25 MCG (1000 UT) CAPS Take 1,000 Units by mouth daily.     fexofenadine (ALLEGRA) 180 MG tablet      finasteride (PROSCAR) 5 MG tablet Take 5 mg by mouth daily.     fluticasone (FLONASE) 50 MCG/ACT nasal spray Place into the nose.      Ibuprofen 200 MG CAPS      lenalidomide (REVLIMID) 10 MG capsule Take 1 capsule (10 mg total) by mouth daily. Take 1 capsule daily for 21 days and then none for 7 days. 21 capsule 0   lisinopril (ZESTRIL) 10 MG tablet TAKE 1 TABLET EVERY DAY 90 tablet 3   Magnesium Oxide 400 MG CAPS      melatonin 5 MG TABS      Omega-3 Fatty Acids (FISH OIL) 1200 MG CAPS Take 1,200 mg by mouth daily.     pantoprazole (PROTONIX) 40 MG tablet Take by mouth.     vitamin B-12 (CYANOCOBALAMIN) 1000 MCG tablet Take 1,000 mcg by mouth daily.     No current facility-administered medications for this visit.     Objective:  BP 130/76   Pulse 80   Temp 97.6 F (36.4 C)   Ht 5\' 8"  (1.727 m)   Wt 197 lb 12.8 oz (89.7 kg)   SpO2 99%   BMI 30.08 kg/m  Gen: NAD, resting comfortably CV: RRR no murmurs rubs or gallops Lungs: CTAB no crackles, wheeze, rhonchi Abdomen: soft/nontender/nondistended/normal bowel sounds. No rebound or guarding. Epigastric hernia noted - bandage over left lower abdomen- chronic wound Ext: trace edema Skin: warm, dry Neuro: some imbalance- patient states per baseline    Assessment and  Plan   #social update- electric self propel lawn mower and allows him some stability-  works in the yard. Helps him with fatigue that he has.   #Health maintenance- wants to hold off on final shingrix otherwise up to date. Doing COVID shots annually Immunization History  Administered Date(s) Administered   Influenza, High Dose Seasonal PF 06/10/2014, 06/28/2015, 04/18/2016, 07/27/2017, 06/04/2018   Influenza, Quadrivalent, Recombinant, Inj, Pf 06/04/2018, 05/13/2019   Influenza-Unspecified 04/29/2012, 07/14/2013, 05/14/2018, 06/01/2021   PFIZER(Purple Top)SARS-COV-2 Vaccination 09/08/2019, 09/29/2019, 05/13/2020, 12/13/2020   Pfizer Covid-19 Vaccine Bivalent Booster 63yrs & up 06/01/2021   Pneumococcal Conjugate-13 01/05/2015, 05/13/2019   Pneumococcal Polysaccharide-23 09/20/2011   Td 04/29/2006,  12/08/2019   Zoster Recombinant(Shingrix) 04/06/2020   Zoster, Live 04/29/2008   #skin cancer history- working with dermatology- cannot exercise until areas are healed on the face- had just gotten into a rhythm 2 days a week (bikes) . More procuedres upcoming -working with Dr. Donzetta Starch and works with Dr. Irene Limbo as well for Mohs  #IgG Kappa multiple myeloma-follows with Dr. Leonides Schanz -Detected on labs December 2022 -- maintenance Revlimid starting January 4th 2024 - m spike has been down for 2 years.   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 Barbaraann Cao of oncology with plan for 36-month repeat -has been stable and no progressive hearing loss  #History of abdominal wall abscess occurring after hernia repair early march 2022- recurrent issues at times but surgery would be major and basically they are opting out as would be hard to heal. No recent surgery visit  #CAD-follows with Dr. Anne Fu #hyperlipidemia/aortic atherosclerosis S: Medication:Atorvastatin 80 mg, fish oil, aspirin 81mg  -no chest pain or shortness of breath  Lab Results  Component Value Date   CHOL 107 01/17/2021   HDL 41.90 01/17/2021   LDLCALC 52 01/17/2021   TRIG 64.0 01/17/2021   CHOLHDL 3 01/17/2021  A/P: coronary artery disease asymptomatic continue current medications  Aortic atherosclerosis (presumed stable)- LDL goal ideally <70 - continue current medications  Lipids at goal previously- want to update levels- see if oncology can draw with monthly labs  #hypertension with white coat element S: medication:  lisinopril 40mg  Home readings #s: 130s/70s BP Readings from Last 3 Encounters:  11/15/23 130/76  10/24/23 137/77  08/29/23 (!) 141/88  A/P: blood pressure looks reasonably controlled  both here and at home lately- continue current medications   # Depression S: Medication:none -prior wellbutrin 300mg  XR for at least 5 years    11/15/2023    1:38 PM 05/08/2022    4:49 PM 05/07/2021   11:23 AM  Depression screen PHQ 2/9  Decreased Interest 0 0 0  Down, Depressed, Hopeless 0 0   PHQ - 2 Score 0 0 0  Altered sleeping 0  0  Tired, decreased energy 3  0  Change in appetite 0  0  Feeling bad or failure about yourself  0  0  Trouble concentrating 0  0  Moving slowly or fidgety/restless 0  0  Suicidal thoughts 0  0  PHQ-9 Score 3  0  Difficult doing work/chores Not difficult at all    A/P: full remission even without medications- continue to monitor   #BPH- rx from Dr. Annabell Howells- sees June 15th #also sees him for nephrolithiasis S: Medication: Finasteride 5 mg -  no recent PSA checks with urology A/P: doing ok recnetly- continue current medications     #Vitamin D deficiency S: Medication: 1000 units per day Last vitamin D Lab Results  Component Value Date   VD25OH 42.24 01/17/2021  A/P: hopefully stable- update vitamin D with oncology labs hopefully. Continue current meds for now    # B12 deficiency S: Current treatment/medication (oral vs. IM):  3000 mcg per day Lab Results  Component Value Date   VITAMINB12 993 (H) 10/28/2021  A/P: hopefully stable- update b12 today. Continue current meds for now    #OSA on CPAP-  Compliant with CPAP still  # allergies- claritin prn effective and Flonase- needing right now  #macrocytosis- despite b12 replacement - could be wine related 2 glasses of wine most nigths  # fatty liver- -strongly encouraged limiting alcohol intake because weight maintenance has been encouraged by oncology Lab Results  Component Value Date   ALT 26 10/24/2023   AST 22 10/24/2023   ALKPHOS 67 10/24/2023   BILITOT 1.1 10/24/2023   #joint pain- takes about 3 ibuprofen per day- we discussed some kidney and cardiac risks as well as gastrointestinal  bleeding risk- some of the gastrointestinal bleeding risk is counered by his protonix. Pull back on ibuprofen if worsening renal function but looked good recently  Recommended follow up: Return in about 1 year (around 11/14/2024) for followup or sooner if needed.Schedule b4 you leave. Future Appointments  Date Time Provider Department Center  11/19/2023  2:45 PM Helane Gunther, DPM [facility]  11/21/2023  1:45 PM [facility]  11/21/2023  2:20 PM Jaci Standard, MD [facility] None  11/21/2023  3:15 PM [facility]  12/19/2023  1:00 PM [facility] None  12/19/2023  1:45 PM [facility] None  01/16/2024  1:00 PM [facility] None  01/16/2024  1:45 PM [facility]USH [facility] None  02/13/2024 10:30 AM [facility] None  02/13/2024 11:00 AM Jaci Standard, MD [facility] None  02/13/2024 12:00 PM [facility] None  05/12/2024 11:00 AM Vaslow, Georgeanna Lea, MD [facility] None    Lab/Order associations: NOT fasting   ICD-10-CM   1. Coronary artery disease involving native heart without angina pectoris, unspecified vessel or lesion type  I25.10 Lipid panel    2. Essential hypertension  I10 Lipid panel    3. B12 deficiency  E53.8 Vitamin B12    4. Vitamin D deficiency  E55.9 VITAMIN D 25 Hydroxy (Vit-D Deficiency, Fractures)    5. Aortic atherosclerosis (HCC)  I70.0     6. Major depressive disorder with single episode, in full remission (HCC)  F32.5     7. Vestibular schwannoma (HCC)  D33.3     8. Screening for diabetes mellitus  Z13.1 Hemoglobin A1c    9. Obesity (BMI 30-39.9)  E66.9 Hemoglobin A1c      No orders of the defined types were placed in this encounter.   Return precautions advised.  Tana Conch, MD

## 2023-11-15 NOTE — Patient Instructions (Addendum)
 I ordered some labs today- see if oncology can use/release these- team can print copy as well for you  Recommended follow up: Return in about 1 year (around 11/14/2024) for followup or sooner if needed.Schedule b4 you leave.

## 2023-11-19 ENCOUNTER — Ambulatory Visit: Payer: Medicare Other | Admitting: Podiatry

## 2023-11-20 DIAGNOSIS — C44329 Squamous cell carcinoma of skin of other parts of face: Secondary | ICD-10-CM | POA: Diagnosis not present

## 2023-11-20 DIAGNOSIS — Z85828 Personal history of other malignant neoplasm of skin: Secondary | ICD-10-CM | POA: Diagnosis not present

## 2023-11-21 ENCOUNTER — Inpatient Hospital Stay: Payer: Medicare Other

## 2023-11-21 ENCOUNTER — Inpatient Hospital Stay: Payer: Medicare Other | Attending: Hematology and Oncology | Admitting: Hematology and Oncology

## 2023-11-21 ENCOUNTER — Inpatient Hospital Stay: Payer: Medicare Other | Attending: Hematology and Oncology

## 2023-11-21 ENCOUNTER — Other Ambulatory Visit: Payer: Self-pay | Admitting: Hematology and Oncology

## 2023-11-21 ENCOUNTER — Telehealth: Payer: Self-pay | Admitting: Radiation Oncology

## 2023-11-21 VITALS — BP 145/91 | HR 61 | Temp 97.9°F | Resp 17 | Wt 200.6 lb

## 2023-11-21 DIAGNOSIS — C9000 Multiple myeloma not having achieved remission: Secondary | ICD-10-CM | POA: Insufficient documentation

## 2023-11-21 DIAGNOSIS — C9001 Multiple myeloma in remission: Secondary | ICD-10-CM | POA: Diagnosis not present

## 2023-11-21 DIAGNOSIS — D539 Nutritional anemia, unspecified: Secondary | ICD-10-CM | POA: Diagnosis not present

## 2023-11-21 DIAGNOSIS — Z79899 Other long term (current) drug therapy: Secondary | ICD-10-CM | POA: Diagnosis not present

## 2023-11-21 LAB — CMP (CANCER CENTER ONLY)
ALT: 27 U/L (ref 0–44)
AST: 22 U/L (ref 15–41)
Albumin: 4.1 g/dL (ref 3.5–5.0)
Alkaline Phosphatase: 70 U/L (ref 38–126)
Anion gap: 4 — ABNORMAL LOW (ref 5–15)
BUN: 18 mg/dL (ref 8–23)
CO2: 27 mmol/L (ref 22–32)
Calcium: 9.2 mg/dL (ref 8.9–10.3)
Chloride: 106 mmol/L (ref 98–111)
Creatinine: 0.96 mg/dL (ref 0.61–1.24)
GFR, Estimated: >60 mL/min (ref >60)
Glucose, Bld: 112 mg/dL — ABNORMAL HIGH (ref 70–99)
Potassium: 4.2 mmol/L (ref 3.5–5.1)
Sodium: 137 mmol/L (ref 135–145)
Total Bilirubin: 1 mg/dL (ref 0.0–1.2)
Total Protein: 6.7 g/dL (ref 6.5–8.1)

## 2023-11-21 LAB — CBC WITH DIFFERENTIAL (CANCER CENTER ONLY)
Abs Immature Granulocytes: 0.01 10*3/uL (ref 0.00–0.07)
Basophils Absolute: 0.1 10*3/uL (ref 0.0–0.1)
Basophils Relative: 2 %
Eosinophils Absolute: 0.1 10*3/uL (ref 0.0–0.5)
Eosinophils Relative: 2 %
HCT: 42.1 % (ref 39.0–52.0)
Hemoglobin: 14.5 g/dL (ref 13.0–17.0)
Immature Granulocytes: 0 %
Lymphocytes Relative: 26 %
Lymphs Abs: 1.4 10*3/uL (ref 0.7–4.0)
MCH: 35.5 pg — ABNORMAL HIGH (ref 26.0–34.0)
MCHC: 34.4 g/dL (ref 30.0–36.0)
MCV: 103.2 fL — ABNORMAL HIGH (ref 80.0–100.0)
Monocytes Absolute: 0.7 10*3/uL (ref 0.1–1.0)
Monocytes Relative: 13 %
Neutro Abs: 3.1 10*3/uL (ref 1.7–7.7)
Neutrophils Relative %: 57 %
Platelet Count: 160 10*3/uL (ref 150–400)
RBC: 4.08 MIL/uL — ABNORMAL LOW (ref 4.22–5.81)
RDW: 15.1 % (ref 11.5–15.5)
WBC Count: 5.4 10*3/uL (ref 4.0–10.5)
nRBC: 0 % (ref 0.0–0.2)

## 2023-11-21 LAB — LACTATE DEHYDROGENASE: LDH: 143 U/L (ref 98–192)

## 2023-11-21 MED ORDER — TRAMADOL HCL 50 MG PO TABS: 50.0000 mg | ORAL_TABLET | Freq: Four times a day (QID) | ORAL | 0 refills | Status: DC | PRN

## 2023-11-21 MED ORDER — DENOSUMAB 120 MG/1.7ML ~~LOC~~ SOLN
120.0000 mg | Freq: Once | SUBCUTANEOUS | Status: AC
Start: 2023-11-21 — End: 2023-11-21
  Administered 2023-11-21: 120 mg via SUBCUTANEOUS
  Filled 2023-11-21: qty 1.7

## 2023-11-21 NOTE — Telephone Encounter (Signed)
 Returned patient's call to reschedule consultation to a sooner date per patient's request. No answer, LVM for a return call.

## 2023-11-21 NOTE — Progress Notes (Signed)
 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 Further workup showed SPEP M-Spike 4.2 g/dL with IgG-Kappa as monoclonal protein, IgG 5908 mg/dL 4/54/0981: Initial evaluation at [facility]. bone marrow biopsy performed, showed 60% abnormal plasma cells in 60% cellular marrow. 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  09/26/2021: transfer care to Dr. Leonides Schanz at [facility].  09/30/2021: Cycle 1 Day 1 of Dara/Dex 10/06/2021: Cycle 1 Day 8 (added revlimid) 10/28/2021: Cycle 2 Day 1 of Dara/Rev/Dex 11/25/2021: Cycle 3 Day 1 of Dara/Rev/Dex 5/12/023: Cycle 4 Day 1 of Dara/Rev/Dex 01/20/2022: Cycle 5 Day 1 of Dara/Rev/Dex 02/17/2022: Cycle 6 Day 1 of Dara/Rev/Dex 03/17/2022: Cycle 7 Day 1 of Dara/Rev/Dex 04/14/2022: Cycle 8 Day 1 of Dara/Rev/Dex 05/22/2022: Cycle 9 Day 1 of Dara/Rev/Dex 06/19/2022: Cycle 10 Day 1 of Dara/Rev/Dex 07/17/2022: Cycle 11 Day 1 of Dara/Rev/Dex 08/17/2022: start maintenance revlimid 10 mg PO daily 21 of 28 days.   Interval History:  Wesley [name] 83 y.o. male with medical history significant for IgG kappa multiple myeloma who presents for a follow up visit. The patient's last visit was on 08/29/2023. In the interim since the last visit he  continued treatment with maintenance revlimid.   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  He currently has a black and blue eye and is having pain.  He reports he will also require radiation therapy.  He notes that he has been taking ibuprofen and Tylenol alternating but is not controlling his pain well.  He reports that he has been continued on the Revlimid therapy and the aspirin therapy throughout this time.  He reports he is just about to be on his 7 days off.  He reports that he has a consult with radiation oncology next Tuesday.  Otherwise he is felt quite well and is tolerating her [place] feels well.  He is also tolerating Xgeva with no jaw pain or other side effects.   Overall he is willing and able to continue on Xgeva therapy as well as Revlimid.  He denies fevers, chills, night sweats, shortness of breath, chest pain or cough. He has no other complaints. Full 10 point ROS is listed below.  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 PT denies significant findings   CARDIAC CATHETERIZATION  2008   COLOSTOMY N/A 10/31/2019   Procedure: COLOSTOMY;  Surgeon: Harriette Bouillon, MD;  Location: [facility];  Service: General;  Laterality: N/A;   CYSTOSCOPY W/ URETERAL STENT PLACEMENT Bilateral 10/31/2019   Procedure: CYSTOSCOPY URETERAL STENT PLACEMENT BILATERAL;  Surgeon: Marcine Matar, MD;  Location: [facility];  Service: Urology;  Laterality: Bilateral;   CYSTOSCOPY WITH STENT PLACEMENT Bilateral 03/11/2020   Procedure: CYSTOSCOPY WITH BILATERAL FIREFLY INJECTION;  Surgeon: Bjorn Pippin, MD;  Location: [facility];  Service: Urology;  Laterality: Bilateral;   EYE SURGERY     BILATERAL CATARACT SURGERY WITH LENS IMPLANTS   FLEXIBLE SIGMOIDOSCOPY N/A 10/29/2019   Procedure: FLEXIBLE SIGMOIDOSCOPY;  Surgeon: Beverley Fiedler, MD;  Location: [facility];  Service: Gastroenterology;  Laterality: N/A;   INCISIONAL HERNIA REPAIR N/A 10/13/2020   Procedure: OPEN INCISIONAL HERNIA REPAIR WITH MESH;  Surgeon: Axel Filler, MD;  Location: [facility] OR;  Service: General;  Laterality: N/A;   INSERTION OF MESH N/A 09/08/2016   Procedure: INSERTION OF MESH;  Surgeon: Avel Peace, MD;  Location: [facility];  Service: General;  Laterality: N/A;   IR RADIOLOGIST EVAL & MGMT  11/10/2020   IR RADIOLOGIST EVAL & MGMT  11/25/2020   LAPAROSCOPIC SIGMOID COLECTOMY N/A 10/31/2019   Procedure: DIAGNOSTIC LAPAROSCOPY; EXPLORATORY LAPAROTOMY; SIGMOID COLECTOMY;  Surgeon: Harriette Bouillon, MD;  Location: [facility];  Service: General;  Laterality: N/A;   right rotator cuff     SUBMUCOSAL TATTOO INJECTION  10/29/2019   Procedure: SUBMUCOSAL TATTOO INJECTION;  Surgeon: Beverley Fiedler, MD;  Location: [facility];  Service: Gastroenterology;;   TONSILLECTOMY     UMBILICAL HERNIA REPAIR N/A 09/08/2016   Procedure: OPEN UMBILICAL HERNIA REPAIR WITH MESH;  Surgeon: Avel Peace, MD;  Location: [facility];  Service: General;   Laterality: N/A;   VENTRAL HERNIA REPAIR N/A 02/03/2021   Procedure: LAPAROSCOPIC VENTRAL HERNIA REPAIR WITH MESH;  Surgeon: Axel Filler, MD;  Location: [facility] OR;  Service: General;  Laterality: N/A;   XI ROBOTIC ASSISTED COLOSTOMY TAKEDOWN N/A 03/11/2020   Procedure: ROBOTIC ASSISTED COLOSTOMY REVERSAL, RIGID PROCTOSCOPY;  Surgeon: Romie Levee, MD;  Location: [facility];  Service: General;  Laterality: N/A;    SOCIAL HISTORY: Social History   Socioeconomic History   Marital status: Married    Spouse name: Rhyker Silversmith    Number of children: 2   Years of education: 12   Highest education level: Bachelor's degree (e.g., BA, AB, BS)  Occupational History   Occupation: Retired   Tobacco Use   Smoking status: Never   Smokeless tobacco: Never  Vaping Use   Vaping status: Never Used  Substance and Sexual Activity   Alcohol use: Yes    Alcohol/week: 17.0 standard drinks of alcohol    Types: 3 Glasses of wine, 14 Standard drinks or equivalent per week   Drug use: No   Sexual activity: Not Currently  Other Topics Concern   Not on file  Social History Narrative  Married. 2 children 52 in 48 in 2022- both went to [employer]. 5 grandkids- all girls from oldest 61 looking premed [employer], 15 soph [employer], 2 63 year olds, 10 year olds.       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 [employer]: fishing, [employer] football and basketball, [place] [employer], yardwork   Social Drivers of Health   Financial Resource Strain: Low Risk  (11/11/2023)   Overall Financial Resource Strain (CARDIA)    Difficulty of Paying Living Expenses: Not hard at all  Food Insecurity: No Food Insecurity (11/11/2023)   Hunger Vital Sign    Worried About Programme researcher, broadcasting/film/video in the Last Year: Never true    Ran Out of Food in the Last Year: Never true  Transportation Needs: No Transportation Needs (11/11/2023)    PRAPARE - Administrator, Civil Service (Medical): No    Lack of Transportation (Non-Medical): No  Physical Activity: Insufficiently Active (11/11/2023)   Exercise Vital Sign    Days of Exercise per Week: 2 days    Minutes of Exercise per Session: 20 min  Stress: No Stress Concern Present (11/11/2023)   [employer] of Occupational Health - Occupational Stress Questionnaire    Feeling of Stress : Only a little  Social Connections: Socially Integrated (11/11/2023)   Social Connection and Isolation Panel [NHANES]    Frequency of Communication with Friends and Family: More than three times a week    Frequency of Social Gatherings with Friends and Family: Twice a week    Attends Religious Services: More than 4 times per year    Active Member of [employer] or Organizations: Yes    Attends Engineer, structural: More than 4 times per year    Marital Status: Married  Catering manager Violence: Not At Risk (05/08/2022)   Humiliation, Afraid, Rape, and Kick questionnaire    Fear of Current or Ex-Partner: No    Emotionally Abused: No    Physically Abused: No    Sexually Abused: No    FAMILY HISTORY: Family History  Problem Relation Age of Onset   Diabetes Mother    Heart failure Mother        around 55   CAD Father        41   Melanoma Brother 53   Healthy Daughter    Healthy Son    Colon cancer Neg Hx    Esophageal cancer Neg Hx    Rectal cancer Neg Hx    Stomach cancer Neg Hx     ALLERGIES:  is allergic to demerol, promethazine hcl, and ativan [lorazepam].  MEDICATIONS:  Current Outpatient Medications  Medication Sig Dispense Refill   traMADol (ULTRAM) 50 MG tablet Take 1 tablet (50 mg total) by mouth every 6 (six) hours as needed. 30 tablet 0   acetaminophen (TYLENOL) 500 MG tablet Take 500 mg by mouth every 6 (six) hours as needed.     aspirin EC 81 MG tablet Take 81 mg by mouth daily. Swallow whole.     atorvastatin (LIPITOR) 80 MG tablet Take 1 tablet (80  mg total) by mouth daily. 15 tablet 0   Cholecalciferol (VITAMIN D3) 25 MCG (1000 UT) CAPS Take 1,000 Units by mouth daily.     fexofenadine (ALLEGRA) 180 MG tablet      finasteride (PROSCAR) 5 MG tablet Take  5 mg by mouth daily.     fluticasone (FLONASE) 50 MCG/ACT nasal spray Place into the nose.     Ibuprofen 200 MG CAPS      lenalidomide (REVLIMID) 10 MG capsule Take 1 capsule (10 mg total) by mouth daily. Take 1 capsule daily for 21 days and then none for 7 days. 21 capsule 0   lisinopril (ZESTRIL) 10 MG tablet TAKE 1 TABLET EVERY DAY 90 tablet 3   Magnesium Oxide 400 MG CAPS      melatonin 5 MG TABS      Omega-3 Fatty Acids (FISH OIL) 1200 MG CAPS Take 1,200 mg by mouth daily.     pantoprazole (PROTONIX) 40 MG tablet Take by mouth.     vitamin B-12 (CYANOCOBALAMIN) 1000 MCG tablet Take 1,000 mcg by mouth daily.     No current facility-administered medications for this visit.    REVIEW OF SYSTEMS:   Constitutional: ( - ) fevers, ( - )  chills , ( - ) night sweats Eyes: ( - ) blurriness of vision, ( - ) double vision, ( - ) watery eyes Ears, nose, mouth, throat, and face: ( - ) mucositis, ( - ) sore throat Respiratory: ( - ) cough, ( - ) dyspnea, ( - ) wheezes Cardiovascular: ( - ) palpitation, ( - ) chest discomfort, ( - ) lower extremity swelling Gastrointestinal:  ( - ) nausea, ( - ) heartburn, ( - ) change in bowel habits Skin: ( - ) abnormal skin rashes Lymphatics: ( - ) new lymphadenopathy, ( - ) easy bruising Neurological: ( + ) numbness, ( - ) tingling, ( - ) new weaknesses Behavioral/Psych: ( - ) mood change, ( - ) new changes  All other systems were reviewed with the patient and are negative.  PHYSICAL EXAMINATION: ECOG PERFORMANCE STATUS: 0 - Asymptomatic  Vitals:   11/21/23 1413 11/21/23 1414  BP: (!) 162/95 (!) 145/91  Pulse: 61   Resp: 17   Temp: 97.9 F (36.6 C)   SpO2: 99%        Filed Weights   11/21/23 1413  Weight: 200 lb 9.6 oz (91 kg)      GENERAL: Well-appearing elderly Caucasian male, alert, no distress and comfortable SKIN: skin color, texture, turgor are normal, no rashes or significant lesions EYES: conjunctiva are pink and non-injected, sclera clear LUNGS: clear to auscultation and percussion with normal breathing effort HEART: regular rate & rhythm and no murmurs and no lower extremity edema Musculoskeletal: no cyanosis of digits and no clubbing  PSYCH: alert & oriented x 3, fluent speech NEURO: no focal motor/sensory deficits  LABORATORY DATA:  I have reviewed the data as listed    Latest Ref Rng & Units 11/21/2023    1:49 PM 10/24/2023    1:04 PM 09/26/2023   10:39 AM  CBC  WBC 4.0 - 10.5 K/uL 5.4  4.5  4.8   Hemoglobin 13.0 - 17.0 g/dL 84.6  96.2  95.2   Hematocrit 39.0 - 52.0 % 42.1  42.2  41.9   Platelets 150 - 400 K/uL 160  163  154        Latest Ref Rng & Units 11/21/2023    1:49 PM 10/24/2023    1:04 PM 09/26/2023   10:39 AM  CMP  Glucose 70 - 99 mg/dL 841  324  401   BUN 8 - 23 mg/dL 18  16  17    Creatinine 0.61 - 1.24 mg/dL 0.27  2.53  1.01   Sodium 135 - 145 mmol/L 137  138  140   Potassium 3.5 - 5.1 mmol/L 4.2  4.0  4.3   Chloride 98 - 111 mmol/L 106  109  108   CO2 22 - 32 mmol/L 27  23  26    Calcium 8.9 - 10.3 mg/dL 9.2  8.1  8.8   Total Protein 6.5 - 8.1 g/dL 6.7  6.4  6.4   Total Bilirubin 0.0 - 1.2 mg/dL 1.0  1.1  1.1   Alkaline Phos 38 - 126 U/L 70  67  72   AST 15 - 41 U/L 22  22  22    ALT 0 - 44 U/L 27  26  24      Lab Results  Component Value Date   MPROTEIN Not Observed 10/24/2023   MPROTEIN Not Observed 09/26/2023   MPROTEIN Not Observed 08/29/2023   Lab Results  Component Value Date   KPAFRELGTCHN 17.2 10/24/2023   KPAFRELGTCHN 18.1 09/26/2023   KPAFRELGTCHN 19.3 08/29/2023   LAMBDASER 12.8 10/24/2023   LAMBDASER 11.5 09/26/2023   LAMBDASER 12.4 08/29/2023   KAPLAMBRATIO 1.34 10/24/2023   KAPLAMBRATIO 1.57 09/26/2023   KAPLAMBRATIO 1.56 08/29/2023     RADIOGRAPHIC STUDIES: No results found.  ASSESSMENT & PLAN Wesley [name] is a 83 y.o. male with medical history significant for IgG kappa multiple myeloma who presents for a follow up visit.   # IgG Kappa Multiple Myeloma --Patient meets diagnostic criteria for multiple myeloma based on the presence of 60% plasma cells in the bone marrow --Pretreatment phenotype RBC as well as type and screen ordered  --Cycle 1 Day 1of Dara/Rev/Dex started on 09/30/2021. He started without revlimid which was added on 10/06/2021 (Cycle 1 Day 8)  --Transitioned to maintenance Revlimid on 08/17/2022.  Plan:  --Currently on maintenance revlimid 10 mg PO daily 21 of 28 days.  --Labs today showed WBC 5.4, hemoglobin 14.5, MCV 103.2, platelets 160. Creatinine and LFTs normal. Myeloma labs pending today --Most recent myeloma labs from 10/24/2023 did not detect M protein with normal sFLC. -- Return to clinic in 4 weeks for labs/Xgeva and in 3 months for a repeat clinic visit   #Squamous Cell Carcinoma of the Skin -- Status post resection, radiation therapy recommended per dermatology. -- Patient has upcoming visit with Dr. Basilio Cairo next week. -- Will discuss duration of therapy and Revlimid with radiation oncology to see if we need to hold our therapy. -- Patient currently struggling with pain control, will call in tramadol 50 mg p.o. every 6 hours as needed to help with his pain.  #Lethargy 2/2 Zometa:  --Discussed alternative which would be Xgeva q 28 days.  -- continue monthly Xgeva.    # Boils on Abdomen-stable -- Patient underwent I&D and subsequent antibiotic treatment with no resolution.  2 lesions have developed at the site of his prior ostomy.  A new lesion was present on 06/19/2022. --Patient was seen by surgery who ordered CT scan on 01/31/2022 that showed no fluid collection in the anterior abdominal wall. Instructed patient on how to pack wounds.  --No systemic symptoms, okay to continue with  systemic chemotherapy. --continue to follow with surgery.   # Right hand neuropathy:  --Suspect carpal tunnel so recommend wearing a wrist brace.  --follows with [facility], next visit Sept 2025  #Supportive Care -- chemotherapy education complete  -- port placement not required.  --ASA 81 mg p.o. daily for thromboprophylaxis on Revlimid. -- zofran 8mg  q8H PRN and compazine  10mg  PO q6H for nausea -- zometa clearance obtained, started on 03/03/2022. Had lethargy with zometa, will be converting to Xgeva.  Continue monthly injections or Xgeva x 2 years (July 2025).  -- no pain medication required at this time.   No orders of the defined types were placed in this encounter.  All questions were answered. The patient knows to call the clinic with any problems, questions or concerns.  I have spent a total of 30 minutes minutes of face-to-face and non-face-to-face time, preparing to see the patient, performing a medically appropriate examination, counseling and educating the patient, ordering medications/tests, documenting clinical information in the electronic health record,  and care coordination.   Ulysees Barns, MD Department of Hematology/Oncology [facility] Phone: [phone] Pager: [phone] Email: [email or web address]  11/21/2023 4:27 PM

## 2023-11-22 LAB — KAPPA/LAMBDA LIGHT CHAINS
Kappa free light chain: 18.4 mg/L (ref 3.3–19.4)
Kappa, lambda light chain ratio: 1.33 (ref 0.26–1.65)
Lambda free light chains: 13.8 mg/L (ref 5.7–26.3)

## 2023-11-23 NOTE — Progress Notes (Signed)
 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 Patient had Mohs surgery and has stitches. Stitches are clean, dry and intact. Patient uses ibuprofen to manage pain. Biopsies    Past/Anticipated interventions by patient's surgeon/dermatologist for current problematic lesion, if any:  Mohs Microscopically controlled surgery 11/21/2023 Dr. Rosaline Coma   Past skin cancers, if any:     History of Blistering sunburns, if any:  Patient used to stay out in the sun when he was younger and   SAFETY ISSUES: Prior radiation? No Pacemaker/ICD? None Possible current pregnancy? None Is the patient on methotrexate? None  Current Complaints / other details:   None

## 2023-11-25 LAB — MULTIPLE MYELOMA PANEL, SERUM
Albumin SerPl Elph-Mcnc: 3.4 g/dL (ref 2.9–4.4)
Albumin/Glob SerPl: 1.4 (ref 0.7–1.7)
Alpha 1: 0.2 g/dL (ref 0.0–0.4)
Alpha2 Glob SerPl Elph-Mcnc: 0.6 g/dL (ref 0.4–1.0)
B-Globulin SerPl Elph-Mcnc: 1 g/dL (ref 0.7–1.3)
Gamma Glob SerPl Elph-Mcnc: 0.7 g/dL (ref 0.4–1.8)
Globulin, Total: 2.5 g/dL (ref 2.2–3.9)
IgA: 256 mg/dL (ref 61–437)
IgG (Immunoglobin G), Serum: 839 mg/dL (ref 603–1613)
IgM (Immunoglobulin M), Srm: 48 mg/dL (ref 15–143)
Total Protein ELP: 5.9 g/dL — ABNORMAL LOW (ref 6.0–8.5)

## 2023-11-26 NOTE — Progress Notes (Signed)
 Radiation Oncology         [phone] ________________________________  Initial outpatient Consultation  Name: Wesley [name] MRN: [id number]  Date: 11/27/2023  DOB: September 29, 1940  OZ:[id number], Saverio Curling, MD  Moira Andrews, MD   REFERRING PHYSICIAN: Moira Andrews, MD  DIAGNOSIS: [phone]   ICD-10-CM   1. 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 male with medical history significant for IgG kappa multiple myeloma initially diagnosed in 2003. Patient was last seen in office on 04/19/21 for a consultation with Dr. Jeryl Moris but did not undergo radiation.   Patient presented to his dermatologist on 10/08/23 for a routine follow up. During his visit, he complained of redness and irritation accompanied by itching on his left cheek. As a result, he underwent a biopsy and then MOHS surgery under the care of Dr. Moira Andrews. Surgical pathology from biopsy indicated well differentiated squamous cell carcinoma. After MOHS, there was concern for an aggressive process.  Dr Lurena Sally was informed by dermatology that the pathology revealed aggressive squamous cell carcinoma with multiple small nerves involved.  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  The disease was difficult to track with Mohs surgery and the assessment of Dr. Jerilynn Montenegro was that there was a high risk for recurrence or  tumor spread.   He then presented for a follow up with Dr. Amparo Balk on 11/21/23, he complained of pain and a black and blue eye. Pain was well managed with ibuprofen and Tylenol.   He will continue on Xgeva and Revlimid therapy. He was referred to radiation oncology per his dermatologist recommendations.       INTRA-OP PHOTOS:   PREVIOUS RADIATION THERAPY: No  PAST MEDICAL HISTORY:  has a past medical history of 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 symptoms (LUTS), CAD (coronary artery disease), Cholelithiasis, Chronic allergic rhinitis, Coronary atherosclerosis of native coronary artery, Fatty liver, GERD (gastroesophageal reflux disease), High blood pressure, Hypercholesterolemia, Macrocytosis without anemia, Multiple myeloma not having achieved remission (HCC) (09/26/2021), OSA on CPAP, Paraesophageal hernia, Partial bowel obstruction (HCC), Skin cancer (2009), and Umbilical hernia without obstruction or gangrene.    PAST SURGICAL HISTORY: Past Surgical History:  Procedure Laterality Date   BIOPSY  10/29/2019   Procedure: BIOPSY;  Surgeon: Nannette Babe, MD;  Location: [facility];  Service: Gastroenterology;;   CARDIAC CATHETERIZATION  2000   Cath to rule out cardiac problems RT HTN. PT denies significant findings   CARDIAC CATHETERIZATION  2008   COLOSTOMY N/A 10/31/2019   Procedure: COLOSTOMY;  Surgeon: Sim Dryer, MD;  Location: [facility];  Service: General;  Laterality: N/A;   CYSTOSCOPY W/ URETERAL STENT PLACEMENT Bilateral 10/31/2019   Procedure: CYSTOSCOPY URETERAL STENT PLACEMENT BILATERAL;  Surgeon: Trent Frizzle, MD;  Location: [facility];  Service: Urology;  Laterality: Bilateral;   CYSTOSCOPY WITH STENT PLACEMENT Bilateral 03/11/2020   Procedure: CYSTOSCOPY WITH BILATERAL  FIREFLY INJECTION;  Surgeon: Bjorn Pippin, MD;  Location: [facility];  Service: Urology;  Laterality: Bilateral;   EYE SURGERY     BILATERAL CATARACT SURGERY WITH LENS IMPLANTS    FLEXIBLE SIGMOIDOSCOPY N/A 10/29/2019   Procedure: FLEXIBLE SIGMOIDOSCOPY;  Surgeon: Beverley Fiedler, MD;  Location: [facility];  Service: Gastroenterology;  Laterality: N/A;   INCISIONAL HERNIA REPAIR N/A 10/13/2020   Procedure: OPEN INCISIONAL HERNIA REPAIR WITH MESH;  Surgeon: Axel Filler, MD;  Location: [facility];  Service: General;  Laterality: N/A;   INSERTION OF MESH N/A 09/08/2016   Procedure: INSERTION OF MESH;  Surgeon: Avel Peace, MD;  Location: [facility];  Service: General;  Laterality: N/A;   IR RADIOLOGIST EVAL & MGMT  11/10/2020   IR RADIOLOGIST EVAL & MGMT  11/25/2020   LAPAROSCOPIC SIGMOID COLECTOMY N/A 10/31/2019   Procedure: DIAGNOSTIC LAPAROSCOPY; EXPLORATORY LAPAROTOMY; SIGMOID COLECTOMY;  Surgeon: Harriette Bouillon, MD;  Location: [facility];  Service: General;  Laterality: N/A;   right rotator cuff     SUBMUCOSAL TATTOO INJECTION  10/29/2019   Procedure: SUBMUCOSAL TATTOO INJECTION;  Surgeon: Beverley Fiedler, MD;  Location: [facility];  Service: Gastroenterology;;   TONSILLECTOMY     UMBILICAL HERNIA REPAIR N/A 09/08/2016   Procedure: OPEN UMBILICAL HERNIA REPAIR WITH MESH;  Surgeon: Avel Peace, MD;  Location: [facility];  Service: General;  Laterality: N/A;   VENTRAL HERNIA REPAIR N/A 02/03/2021   Procedure: LAPAROSCOPIC VENTRAL HERNIA REPAIR WITH MESH;  Surgeon: Axel Filler, MD;  Location: [facility] OR;  Service: General;  Laterality: N/A;   XI ROBOTIC ASSISTED COLOSTOMY TAKEDOWN N/A 03/11/2020   Procedure: ROBOTIC ASSISTED COLOSTOMY REVERSAL, RIGID PROCTOSCOPY;  Surgeon: Romie Levee, MD;  Location: [facility];  Service: General;  Laterality: N/A;    FAMILY HISTORY: family history includes CAD in his father; Diabetes in his mother; Healthy in his daughter and son; Heart failure in his mother; Melanoma (age of onset: 18) in his brother.  SOCIAL HISTORY:  reports that he has never smoked. He has never used smokeless tobacco. He reports current alcohol use of about 17.0 standard drinks  of alcohol per week. He reports that he does not use drugs.  ALLERGIES: Demerol, Promethazine hcl, and Ativan [lorazepam]  MEDICATIONS:  Current Outpatient Medications  Medication Sig Dispense Refill   acetaminophen (TYLENOL) 500 MG tablet Take 500 mg by mouth every 6 (six) hours as needed.     aspirin EC 81 MG tablet Take 81 mg by mouth daily. Swallow whole.     atorvastatin (LIPITOR) 80 MG tablet Take 1 tablet (80 mg total) by mouth daily. 15 tablet 0   Cholecalciferol (VITAMIN D3) 25 MCG (1000 UT) CAPS Take 1,000 Units by mouth daily.     fexofenadine (ALLEGRA) 180 MG tablet      finasteride (PROSCAR) 5 MG tablet Take 5 mg by mouth daily.     fluticasone (FLONASE) 50 MCG/ACT nasal spray Place into the nose.     Ibuprofen 200 MG CAPS      lenalidomide (REVLIMID) 10 MG capsule Take 1 capsule (10 mg total) by mouth daily. Take 1 capsule daily for 21 days and then none for 7 days. 21 capsule 0   lisinopril (ZESTRIL) 10 MG tablet TAKE 1 TABLET EVERY DAY 90 tablet 3   Magnesium Oxide 400 MG CAPS      melatonin 5 MG TABS      Omega-3 Fatty Acids (FISH OIL) 1200 MG CAPS Take 1,200 mg by mouth daily.  pantoprazole (PROTONIX) 40 MG tablet Take by mouth.     traMADol (ULTRAM) 50 MG tablet Take 1 tablet (50 mg total) by mouth every 6 (six) hours as needed. 30 tablet 0   vitamin B-12 (CYANOCOBALAMIN) 1000 MCG tablet Take 1,000 mcg by mouth daily.     No current facility-administered medications for this encounter.    REVIEW OF SYSTEMS:  Notable for that above.   PHYSICAL EXAM:  height is 5\' 8"  (1.727 m) and weight is 198 lb (89.8 kg). His temperature is 97.8 F (36.6 C). His blood pressure is 136/70 and his pulse is 87. His respiration is 20 and oxygen saturation is 98%.   General: Alert and oriented, in no acute distress  HEENT: Head is normocephalic. Extraocular movements are intact. Oropharynx is clear. Sutures in place, left cheek, healing well from MOHS Neck: Neck is supple, no  palpable parotid, submental, cervical or supraclavicular lymphadenopathy. Musculoskeletal: well nourished, ambulatory. Neurologic: Cranial nerves II through XII are grossly intact. No obvious focalities. Speech is fluent. Coordination is intact. Psychiatric: Judgment and insight are intact. Affect is appropriate.  ECOG = 1  0 - Asymptomatic (Fully active, able to carry on all predisease activities without restriction)  1 - Symptomatic but completely ambulatory (Restricted in physically strenuous activity but ambulatory and able to carry out work of a light or sedentary nature. For example, light housework, office work)  2 - Symptomatic, <50% in bed during the day (Ambulatory and capable of all self care but unable to carry out any work activities. Up and about more than 50% of waking hours)  3 - Symptomatic, >50% in bed, but not bedbound (Capable of only limited self-care, confined to bed or chair 50% or more of waking hours)  4 - Bedbound (Completely disabled. Cannot carry on any self-care. Totally confined to bed or chair)  5 - Death   Aurea Blossom MM, Creech RH, Tormey DC, et al. [phone]). "Toxicity and response criteria of the [facility]". Am. Hillard Lowes. Oncol. 5 (6): 649-55   LABORATORY DATA:  Lab Results  Component Value Date   WBC 5.4 11/21/2023   HGB 14.5 11/21/2023   HCT 42.1 11/21/2023   MCV 103.2 (H) 11/21/2023   PLT 160 11/21/2023   CMP     Component Value Date/Time   NA 137 11/21/2023 1349   K 4.2 11/21/2023 1349   CL 106 11/21/2023 1349   CO2 27 11/21/2023 1349   GLUCOSE 112 (H) 11/21/2023 1349   BUN 18 11/21/2023 1349   CREATININE 0.96 11/21/2023 1349   CALCIUM 9.2 11/21/2023 1349   PROT 6.7 11/21/2023 1349   PROT 10.4 (HH) 08/01/2021 1117   ALBUMIN 4.1 11/21/2023 1349   AST 22 11/21/2023 1349   ALT 27 11/21/2023 1349   ALKPHOS 70 11/21/2023 1349   BILITOT 1.0 11/21/2023 1349   GFR 81.36 08/01/2021 1117   GFRNONAA >60 11/21/2023 1349          RADIOGRAPHY: No results found.    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  The  disease was difficult to track with Mohs surgery and the assessment of Dr. Jerilynn Montenegro was that there was a high risk for recurrence or tumor spread.  I spoke with Dr. Jerilynn Montenegro personally about his case following my meeting with Mr. Nevares.  When I spoke with Dr. Jerilynn Montenegro after consult our consensus is to start his radiation on May 5 to allow sufficient healing.  We also talked about getting a CT neck with contrast prior to RT planning to ensure we do not see any suspicious nodes in the neck, given the aggressive histology. The patient's neck was flat when I palpated it, so this is out of an abundance of caution.  We will arrange diagnostic CT neck w/ contrast for next week and CT sim (RT planning) a couple days afterwards.    I look forward to participating in his care. Tentatively anticipate 50Gy/20 fx with electrons to the left cheek if the CT scan of the neck nodes is negative.   On date of service, in total, I spent 60 minutes on this encounter. Patient was seen in person. Note signed after encounter date; minutes pertain to date of service, only.  __________________________________________   Colie Dawes, MD  This document serves as a record of services personally performed by Colie Dawes, MD. It was created on her behalf by Lucky Sable, a trained medical scribe. The creation of this record is based on the  scribe's personal observations and the provider's statements to them. This document has been checked and approved by the attending provider.

## 2023-11-27 ENCOUNTER — Encounter: Payer: Self-pay | Admitting: Radiation Oncology

## 2023-11-27 ENCOUNTER — Ambulatory Visit
Admission: RE | Admit: 2023-11-27 | Discharge: 2023-11-27 | Disposition: A | Discharge status: Home / Self Care | Source: Ambulatory Visit | Attending: Radiation Oncology | Admitting: Radiation Oncology

## 2023-11-27 ENCOUNTER — Other Ambulatory Visit: Payer: Self-pay

## 2023-11-27 VITALS — BP 136/70 | HR 87 | Temp 97.8°F | Resp 20 | Ht 68.0 in | Wt 198.0 lb

## 2023-11-27 DIAGNOSIS — C9000 Multiple myeloma not having achieved remission: Secondary | ICD-10-CM | POA: Diagnosis not present

## 2023-11-27 DIAGNOSIS — Z7961 Long term (current) use of immunomodulator: Secondary | ICD-10-CM | POA: Insufficient documentation

## 2023-11-27 DIAGNOSIS — C44329 Squamous cell carcinoma of skin of other parts of face: Secondary | ICD-10-CM

## 2023-11-27 DIAGNOSIS — Z7982 Long term (current) use of aspirin: Secondary | ICD-10-CM | POA: Insufficient documentation

## 2023-11-27 DIAGNOSIS — Z79899 Other long term (current) drug therapy: Secondary | ICD-10-CM | POA: Insufficient documentation

## 2023-11-27 DIAGNOSIS — D0439 Carcinoma in situ of skin of other parts of face: Secondary | ICD-10-CM

## 2023-11-27 DIAGNOSIS — E7800 Pure hypercholesterolemia, unspecified: Secondary | ICD-10-CM | POA: Diagnosis not present

## 2023-11-27 DIAGNOSIS — I2510 Atherosclerotic heart disease of native coronary artery without angina pectoris: Secondary | ICD-10-CM | POA: Diagnosis not present

## 2023-11-27 DIAGNOSIS — K760 Fatty (change of) liver, not elsewhere classified: Secondary | ICD-10-CM | POA: Diagnosis not present

## 2023-11-27 DIAGNOSIS — K219 Gastro-esophageal reflux disease without esophagitis: Secondary | ICD-10-CM | POA: Insufficient documentation

## 2023-11-27 DIAGNOSIS — Z808 Family history of malignant neoplasm of other organs or systems: Secondary | ICD-10-CM | POA: Diagnosis not present

## 2023-11-27 DIAGNOSIS — D333 Benign neoplasm of cranial nerves: Secondary | ICD-10-CM | POA: Diagnosis not present

## 2023-11-27 DIAGNOSIS — K449 Diaphragmatic hernia without obstruction or gangrene: Secondary | ICD-10-CM | POA: Insufficient documentation

## 2023-11-27 NOTE — Progress Notes (Signed)
 Oncology Nurse Navigator Documentation   Met with patient during initial consult with Wesley [name]. He was accompanied by his wife, [name].  I introduced myself as his/their Navigator, explained my role as a member of the Care Team. Assisted with post-consult appt scheduling. They know that he will be called for an appointment for radiation planning. Radiation start date will be determined by Dr. Jerilynn Montenegro.  They verbalized understanding of information provided. I encouraged them to call with questions/concerns moving forward.  Lynetta Saran, RN, BSN, OCN Head & Neck Oncology Nurse Navigator [facility] [place] [phone]

## 2023-11-28 ENCOUNTER — Encounter: Payer: Self-pay | Admitting: Radiation Oncology

## 2023-11-28 DIAGNOSIS — D333 Benign neoplasm of cranial nerves: Secondary | ICD-10-CM | POA: Diagnosis not present

## 2023-11-28 DIAGNOSIS — C44329 Squamous cell carcinoma of skin of other parts of face: Secondary | ICD-10-CM | POA: Insufficient documentation

## 2023-12-02 ENCOUNTER — Other Ambulatory Visit: Payer: Self-pay | Admitting: Hematology and Oncology

## 2023-12-02 DIAGNOSIS — C9000 Multiple myeloma not having achieved remission: Secondary | ICD-10-CM

## 2023-12-04 ENCOUNTER — Ambulatory Visit: Admitting: Radiation Oncology

## 2023-12-04 ENCOUNTER — Ambulatory Visit (HOSPITAL_COMMUNITY)
Admission: RE | Admit: 2023-12-04 | Discharge: 2023-12-04 | Disposition: A | Discharge status: Home / Self Care | Source: Ambulatory Visit | Attending: Radiation Oncology | Admitting: Radiation Oncology

## 2023-12-04 ENCOUNTER — Ambulatory Visit

## 2023-12-04 ENCOUNTER — Telehealth: Payer: Self-pay | Admitting: *Deleted

## 2023-12-04 DIAGNOSIS — R221 Localized swelling, mass and lump, neck: Secondary | ICD-10-CM | POA: Diagnosis not present

## 2023-12-04 DIAGNOSIS — I6523 Occlusion and stenosis of bilateral carotid arteries: Secondary | ICD-10-CM | POA: Diagnosis not present

## 2023-12-04 DIAGNOSIS — M47812 Spondylosis without myelopathy or radiculopathy, cervical region: Secondary | ICD-10-CM | POA: Diagnosis not present

## 2023-12-04 DIAGNOSIS — C760 Malignant neoplasm of head, face and neck: Secondary | ICD-10-CM | POA: Diagnosis not present

## 2023-12-04 DIAGNOSIS — C44329 Squamous cell carcinoma of skin of other parts of face: Secondary | ICD-10-CM | POA: Insufficient documentation

## 2023-12-04 MED ORDER — IOHEXOL 300 MG/ML  SOLN
75.0000 mL | Freq: Once | INTRAMUSCULAR | Status: AC | PRN
  Administered 2023-12-04: 75 mL via INTRAVENOUS

## 2023-12-04 NOTE — Telephone Encounter (Signed)
-----   Message from Nurse Katherne Pander sent at 12/04/2023  4:22 PM EDT -----  ----- Message ----- From: Alana Hoyle, RN Sent: 12/03/2023   5:17 PM EDT To: Colin Dawley, RN   ----- Message ----- From: Ander Bame, MD Sent: 12/02/2023   3:26 PM EDT To: Alana Hoyle, RN  Please let Mr. Wesley [name] know that his labs show no abnormality and his M protein remains undetectable.  He remains in remission.  Will plan to continue checking his labs monthly with his next clinic visit to be in July 2025. ----- Message ----- From: Dannis Dy, [facility] [place] Sent: 11/21/2023   2:12 PM EDT To: Ander Bame, MD

## 2023-12-04 NOTE — Telephone Encounter (Signed)
 PC to patient, no answer, left VM - informed him of Dr Les Rao message below, his labs show no abnormality and his M protein remains undetectable. He remains in remission. Will plan to continue checking his labs monthly with his next clinic visit to be in July 2025. Instructed patient to contact office with any questions, [phone].

## 2023-12-05 ENCOUNTER — Other Ambulatory Visit: Payer: Self-pay | Admitting: *Deleted

## 2023-12-05 DIAGNOSIS — C9000 Multiple myeloma not having achieved remission: Secondary | ICD-10-CM

## 2023-12-05 MED ORDER — LENALIDOMIDE 10 MG PO CAPS: 10.0000 mg | ORAL_CAPSULE | Freq: Every day | ORAL | 0 refills | Status: DC

## 2023-12-07 ENCOUNTER — Other Ambulatory Visit: Payer: Self-pay | Admitting: Cardiology

## 2023-12-10 ENCOUNTER — Ambulatory Visit
Admission: RE | Admit: 2023-12-10 | Discharge: 2023-12-10 | Disposition: A | Discharge status: Home / Self Care | Source: Ambulatory Visit | Attending: Radiation Oncology | Admitting: Radiation Oncology

## 2023-12-10 DIAGNOSIS — D333 Benign neoplasm of cranial nerves: Secondary | ICD-10-CM | POA: Diagnosis not present

## 2023-12-10 DIAGNOSIS — D0439 Carcinoma in situ of skin of other parts of face: Secondary | ICD-10-CM | POA: Insufficient documentation

## 2023-12-10 DIAGNOSIS — C44329 Squamous cell carcinoma of skin of other parts of face: Secondary | ICD-10-CM | POA: Insufficient documentation

## 2023-12-14 ENCOUNTER — Ambulatory Visit
Admission: RE | Admit: 2023-12-14 | Discharge: 2023-12-14 | Disposition: A | Discharge status: Home / Self Care | Source: Ambulatory Visit | Attending: Radiation Oncology | Admitting: Radiation Oncology

## 2023-12-14 DIAGNOSIS — C44329 Squamous cell carcinoma of skin of other parts of face: Secondary | ICD-10-CM | POA: Diagnosis not present

## 2023-12-14 DIAGNOSIS — D333 Benign neoplasm of cranial nerves: Secondary | ICD-10-CM | POA: Diagnosis not present

## 2023-12-17 ENCOUNTER — Other Ambulatory Visit: Payer: Self-pay

## 2023-12-17 ENCOUNTER — Ambulatory Visit
Admission: RE | Admit: 2023-12-17 | Discharge: 2023-12-17 | Disposition: A | Discharge status: Home / Self Care | Source: Ambulatory Visit | Attending: Radiation Oncology

## 2023-12-17 ENCOUNTER — Ambulatory Visit
Admission: RE | Admit: 2023-12-17 | Discharge: 2023-12-17 | Disposition: A | Discharge status: Home / Self Care | Source: Ambulatory Visit | Attending: Radiation Oncology | Admitting: Radiation Oncology

## 2023-12-17 DIAGNOSIS — C44329 Squamous cell carcinoma of skin of other parts of face: Secondary | ICD-10-CM | POA: Diagnosis not present

## 2023-12-17 DIAGNOSIS — D333 Benign neoplasm of cranial nerves: Secondary | ICD-10-CM | POA: Diagnosis not present

## 2023-12-17 LAB — RAD ONC ARIA SESSION SUMMARY
Course Elapsed Days: 0
Plan Fractions Treated to Date: 1
Plan Prescribed Dose Per Fraction: 2.5 Gy
Plan Total Fractions Prescribed: 20
Plan Total Prescribed Dose: 50 Gy
Reference Point Dosage Given to Date: 2.5 Gy
Reference Point Session Dosage Given: 2.5 Gy
Session Number: 1

## 2023-12-17 MED ORDER — SONAFINE EX EMUL
1.0000 | Freq: Two times a day (BID) | CUTANEOUS | Status: DC
Start: 2023-12-17 — End: 2023-12-18
  Administered 2023-12-17: 1 via TOPICAL

## 2023-12-18 ENCOUNTER — Ambulatory Visit
Admission: RE | Admit: 2023-12-18 | Discharge: 2023-12-18 | Disposition: A | Discharge status: Home / Self Care | Source: Ambulatory Visit | Attending: Radiation Oncology

## 2023-12-18 ENCOUNTER — Other Ambulatory Visit: Payer: Self-pay

## 2023-12-18 DIAGNOSIS — C44329 Squamous cell carcinoma of skin of other parts of face: Secondary | ICD-10-CM | POA: Diagnosis not present

## 2023-12-18 LAB — RAD ONC ARIA SESSION SUMMARY
Course Elapsed Days: 1
Plan Fractions Treated to Date: 2
Plan Prescribed Dose Per Fraction: 2.5 Gy
Plan Total Fractions Prescribed: 20
Plan Total Prescribed Dose: 50 Gy
Reference Point Dosage Given to Date: 5 Gy
Reference Point Session Dosage Given: 1.7175 Gy
Session Number: 2

## 2023-12-19 ENCOUNTER — Other Ambulatory Visit: Payer: Self-pay

## 2023-12-19 ENCOUNTER — Ambulatory Visit
Admission: RE | Admit: 2023-12-19 | Discharge: 2023-12-19 | Disposition: A | Discharge status: Home / Self Care | Source: Ambulatory Visit | Attending: Radiation Oncology

## 2023-12-19 ENCOUNTER — Inpatient Hospital Stay: Payer: Medicare Other

## 2023-12-19 ENCOUNTER — Inpatient Hospital Stay: Payer: Medicare Other | Attending: Hematology and Oncology

## 2023-12-19 ENCOUNTER — Other Ambulatory Visit: Payer: Self-pay | Admitting: *Deleted

## 2023-12-19 VITALS — BP 138/85 | HR 60 | Temp 98.6°F | Resp 20

## 2023-12-19 DIAGNOSIS — Z79899 Other long term (current) drug therapy: Secondary | ICD-10-CM | POA: Insufficient documentation

## 2023-12-19 DIAGNOSIS — C9000 Multiple myeloma not having achieved remission: Secondary | ICD-10-CM | POA: Insufficient documentation

## 2023-12-19 DIAGNOSIS — C9001 Multiple myeloma in remission: Secondary | ICD-10-CM

## 2023-12-19 DIAGNOSIS — C44329 Squamous cell carcinoma of skin of other parts of face: Secondary | ICD-10-CM | POA: Diagnosis not present

## 2023-12-19 LAB — CBC WITH DIFFERENTIAL (CANCER CENTER ONLY)
Abs Immature Granulocytes: 0.01 10*3/uL (ref 0.00–0.07)
Basophils Absolute: 0.1 10*3/uL (ref 0.0–0.1)
Basophils Relative: 2 %
Eosinophils Absolute: 0.1 10*3/uL (ref 0.0–0.5)
Eosinophils Relative: 2 %
HCT: 42.2 % (ref 39.0–52.0)
Hemoglobin: 14.8 g/dL (ref 13.0–17.0)
Immature Granulocytes: 0 %
Lymphocytes Relative: 27 %
Lymphs Abs: 1.2 10*3/uL (ref 0.7–4.0)
MCH: 36.5 pg — ABNORMAL HIGH (ref 26.0–34.0)
MCHC: 35.1 g/dL (ref 30.0–36.0)
MCV: 104.2 fL — ABNORMAL HIGH (ref 80.0–100.0)
Monocytes Absolute: 0.6 10*3/uL (ref 0.1–1.0)
Monocytes Relative: 14 %
Neutro Abs: 2.5 10*3/uL (ref 1.7–7.7)
Neutrophils Relative %: 55 %
Platelet Count: 162 10*3/uL (ref 150–400)
RBC: 4.05 MIL/uL — ABNORMAL LOW (ref 4.22–5.81)
RDW: 14.9 % (ref 11.5–15.5)
WBC Count: 4.6 10*3/uL (ref 4.0–10.5)
nRBC: 0 % (ref 0.0–0.2)

## 2023-12-19 LAB — CMP (CANCER CENTER ONLY)
ALT: 29 U/L (ref 0–44)
AST: 24 U/L (ref 15–41)
Albumin: 4 g/dL (ref 3.5–5.0)
Alkaline Phosphatase: 63 U/L (ref 38–126)
Anion gap: 10 (ref 5–15)
BUN: 17 mg/dL (ref 8–23)
CO2: 25 mmol/L (ref 22–32)
Calcium: 9.2 mg/dL (ref 8.9–10.3)
Chloride: 104 mmol/L (ref 98–111)
Creatinine: 1.1 mg/dL (ref 0.61–1.24)
GFR, Estimated: >60 mL/min (ref >60)
Glucose, Bld: 103 mg/dL — ABNORMAL HIGH (ref 70–99)
Potassium: 4.2 mmol/L (ref 3.5–5.1)
Sodium: 139 mmol/L (ref 135–145)
Total Bilirubin: 0.9 mg/dL (ref 0.0–1.2)
Total Protein: 6.5 g/dL (ref 6.5–8.1)

## 2023-12-19 LAB — RAD ONC ARIA SESSION SUMMARY
Course Elapsed Days: 2
Plan Fractions Treated to Date: 3
Plan Prescribed Dose Per Fraction: 2.5 Gy
Plan Total Fractions Prescribed: 20
Plan Total Prescribed Dose: 50 Gy
Reference Point Dosage Given to Date: 7.5 Gy
Reference Point Session Dosage Given: 2.5 Gy
Session Number: 3

## 2023-12-19 LAB — LACTATE DEHYDROGENASE: LDH: 147 U/L (ref 98–192)

## 2023-12-19 MED ORDER — DENOSUMAB 120 MG/1.7ML ~~LOC~~ SOLN
120.0000 mg | Freq: Once | SUBCUTANEOUS | Status: AC
  Administered 2023-12-19: 120 mg via SUBCUTANEOUS
  Filled 2023-12-19: qty 1.7

## 2023-12-20 ENCOUNTER — Ambulatory Visit
Admission: RE | Admit: 2023-12-20 | Discharge: 2023-12-20 | Disposition: A | Discharge status: Home / Self Care | Source: Ambulatory Visit | Attending: Radiation Oncology | Admitting: Radiation Oncology

## 2023-12-20 ENCOUNTER — Other Ambulatory Visit: Payer: Self-pay

## 2023-12-20 DIAGNOSIS — C44329 Squamous cell carcinoma of skin of other parts of face: Secondary | ICD-10-CM | POA: Diagnosis not present

## 2023-12-20 LAB — RAD ONC ARIA SESSION SUMMARY
Course Elapsed Days: 3
Plan Fractions Treated to Date: 4
Plan Prescribed Dose Per Fraction: 2.5 Gy
Plan Total Fractions Prescribed: 20
Plan Total Prescribed Dose: 50 Gy
Reference Point Dosage Given to Date: 10 Gy
Reference Point Session Dosage Given: 2.5 Gy
Session Number: 4

## 2023-12-20 LAB — KAPPA/LAMBDA LIGHT CHAINS
Kappa free light chain: 16.5 mg/L (ref 3.3–19.4)
Kappa, lambda light chain ratio: 1.49 (ref 0.26–1.65)
Lambda free light chains: 11.1 mg/L (ref 5.7–26.3)

## 2023-12-21 ENCOUNTER — Other Ambulatory Visit: Payer: Self-pay

## 2023-12-21 ENCOUNTER — Ambulatory Visit
Admission: RE | Admit: 2023-12-21 | Discharge: 2023-12-21 | Disposition: A | Discharge status: Home / Self Care | Source: Ambulatory Visit | Attending: Radiation Oncology

## 2023-12-21 DIAGNOSIS — Z510 Encounter for antineoplastic radiation therapy: Secondary | ICD-10-CM | POA: Diagnosis not present

## 2023-12-21 DIAGNOSIS — D333 Benign neoplasm of cranial nerves: Secondary | ICD-10-CM | POA: Diagnosis not present

## 2023-12-21 DIAGNOSIS — C44329 Squamous cell carcinoma of skin of other parts of face: Secondary | ICD-10-CM | POA: Diagnosis not present

## 2023-12-21 LAB — RAD ONC ARIA SESSION SUMMARY
Course Elapsed Days: 4
Plan Fractions Treated to Date: 5
Plan Prescribed Dose Per Fraction: 2.5 Gy
Plan Total Fractions Prescribed: 20
Plan Total Prescribed Dose: 50 Gy
Reference Point Dosage Given to Date: 12.5 Gy
Reference Point Session Dosage Given: 2.5 Gy
Session Number: 5

## 2023-12-23 LAB — MULTIPLE MYELOMA PANEL, SERUM
Albumin SerPl Elph-Mcnc: 3.6 g/dL (ref 2.9–4.4)
Albumin/Glob SerPl: 1.4 (ref 0.7–1.7)
Alpha 1: 0.2 g/dL (ref 0.0–0.4)
Alpha2 Glob SerPl Elph-Mcnc: 0.6 g/dL (ref 0.4–1.0)
B-Globulin SerPl Elph-Mcnc: 1 g/dL (ref 0.7–1.3)
Gamma Glob SerPl Elph-Mcnc: 0.8 g/dL (ref 0.4–1.8)
Globulin, Total: 2.7 g/dL (ref 2.2–3.9)
IgA: 252 mg/dL (ref 61–437)
IgG (Immunoglobin G), Serum: 797 mg/dL (ref 603–1613)
IgM (Immunoglobulin M), Srm: 49 mg/dL (ref 15–143)
Total Protein ELP: 6.3 g/dL (ref 6.0–8.5)

## 2023-12-24 ENCOUNTER — Ambulatory Visit
Admission: RE | Admit: 2023-12-24 | Discharge: 2023-12-24 | Disposition: A | Discharge status: Home / Self Care | Source: Ambulatory Visit | Attending: Radiation Oncology | Admitting: Radiation Oncology

## 2023-12-24 ENCOUNTER — Encounter: Payer: Self-pay | Admitting: Physician Assistant

## 2023-12-24 ENCOUNTER — Other Ambulatory Visit: Payer: Self-pay

## 2023-12-24 ENCOUNTER — Ambulatory Visit
Admission: RE | Admit: 2023-12-24 | Discharge: 2023-12-24 | Disposition: A | Discharge status: Home / Self Care | Source: Ambulatory Visit | Attending: Radiation Oncology

## 2023-12-24 DIAGNOSIS — C44329 Squamous cell carcinoma of skin of other parts of face: Secondary | ICD-10-CM | POA: Diagnosis not present

## 2023-12-24 LAB — RAD ONC ARIA SESSION SUMMARY
Course Elapsed Days: 7
Plan Fractions Treated to Date: 6
Plan Prescribed Dose Per Fraction: 2.5 Gy
Plan Total Fractions Prescribed: 20
Plan Total Prescribed Dose: 50 Gy
Reference Point Dosage Given to Date: 15 Gy
Reference Point Session Dosage Given: 2.5 Gy
Session Number: 6

## 2023-12-25 ENCOUNTER — Other Ambulatory Visit: Payer: Self-pay

## 2023-12-25 ENCOUNTER — Ambulatory Visit
Admission: RE | Admit: 2023-12-25 | Discharge: 2023-12-25 | Disposition: A | Discharge status: Home / Self Care | Source: Ambulatory Visit | Attending: Radiation Oncology

## 2023-12-25 DIAGNOSIS — C44329 Squamous cell carcinoma of skin of other parts of face: Secondary | ICD-10-CM | POA: Diagnosis not present

## 2023-12-25 LAB — RAD ONC ARIA SESSION SUMMARY
Course Elapsed Days: 8
Plan Fractions Treated to Date: 7
Plan Prescribed Dose Per Fraction: 2.5 Gy
Plan Total Fractions Prescribed: 20
Plan Total Prescribed Dose: 50 Gy
Reference Point Dosage Given to Date: 17.5 Gy
Reference Point Session Dosage Given: 2.5 Gy
Session Number: 7

## 2023-12-26 ENCOUNTER — Other Ambulatory Visit: Payer: Self-pay

## 2023-12-26 ENCOUNTER — Ambulatory Visit
Admission: RE | Admit: 2023-12-26 | Discharge: 2023-12-26 | Disposition: A | Discharge status: Home / Self Care | Source: Ambulatory Visit | Attending: Radiation Oncology

## 2023-12-26 DIAGNOSIS — C44329 Squamous cell carcinoma of skin of other parts of face: Secondary | ICD-10-CM | POA: Diagnosis not present

## 2023-12-26 LAB — RAD ONC ARIA SESSION SUMMARY
Course Elapsed Days: 9
Plan Fractions Treated to Date: 8
Plan Prescribed Dose Per Fraction: 2.5 Gy
Plan Total Fractions Prescribed: 20
Plan Total Prescribed Dose: 50 Gy
Reference Point Dosage Given to Date: 20 Gy
Reference Point Session Dosage Given: 2.5 Gy
Session Number: 8

## 2023-12-27 ENCOUNTER — Other Ambulatory Visit: Payer: Self-pay

## 2023-12-27 ENCOUNTER — Ambulatory Visit
Admission: RE | Admit: 2023-12-27 | Discharge: 2023-12-27 | Disposition: A | Discharge status: Home / Self Care | Source: Ambulatory Visit | Attending: Radiation Oncology

## 2023-12-27 DIAGNOSIS — C44329 Squamous cell carcinoma of skin of other parts of face: Secondary | ICD-10-CM | POA: Diagnosis not present

## 2023-12-27 LAB — RAD ONC ARIA SESSION SUMMARY
Course Elapsed Days: 10
Plan Fractions Treated to Date: 9
Plan Prescribed Dose Per Fraction: 2.5 Gy
Plan Total Fractions Prescribed: 20
Plan Total Prescribed Dose: 50 Gy
Reference Point Dosage Given to Date: 22.5 Gy
Reference Point Session Dosage Given: 2.5 Gy
Session Number: 9

## 2023-12-28 ENCOUNTER — Other Ambulatory Visit: Payer: Self-pay

## 2023-12-28 ENCOUNTER — Ambulatory Visit
Admission: RE | Admit: 2023-12-28 | Discharge: 2023-12-28 | Disposition: A | Discharge status: Home / Self Care | Source: Ambulatory Visit | Attending: Radiation Oncology | Admitting: Radiation Oncology

## 2023-12-28 DIAGNOSIS — C44329 Squamous cell carcinoma of skin of other parts of face: Secondary | ICD-10-CM | POA: Diagnosis not present

## 2023-12-28 DIAGNOSIS — D333 Benign neoplasm of cranial nerves: Secondary | ICD-10-CM | POA: Diagnosis not present

## 2023-12-28 DIAGNOSIS — Z510 Encounter for antineoplastic radiation therapy: Secondary | ICD-10-CM | POA: Diagnosis not present

## 2023-12-28 LAB — RAD ONC ARIA SESSION SUMMARY
Course Elapsed Days: 11
Plan Fractions Treated to Date: 10
Plan Prescribed Dose Per Fraction: 2.5 Gy
Plan Total Fractions Prescribed: 20
Plan Total Prescribed Dose: 50 Gy
Reference Point Dosage Given to Date: 25 Gy
Reference Point Session Dosage Given: 2.5 Gy
Session Number: 10

## 2023-12-30 ENCOUNTER — Other Ambulatory Visit: Payer: Self-pay | Admitting: Hematology and Oncology

## 2023-12-30 DIAGNOSIS — C9000 Multiple myeloma not having achieved remission: Secondary | ICD-10-CM

## 2023-12-31 ENCOUNTER — Ambulatory Visit
Admission: RE | Admit: 2023-12-31 | Discharge: 2023-12-31 | Disposition: A | Discharge status: Home / Self Care | Source: Ambulatory Visit | Attending: Radiation Oncology | Admitting: Radiation Oncology

## 2023-12-31 ENCOUNTER — Other Ambulatory Visit: Payer: Self-pay

## 2023-12-31 ENCOUNTER — Other Ambulatory Visit: Payer: Self-pay | Admitting: *Deleted

## 2023-12-31 ENCOUNTER — Encounter: Payer: Self-pay | Admitting: Hematology and Oncology

## 2023-12-31 DIAGNOSIS — C9000 Multiple myeloma not having achieved remission: Secondary | ICD-10-CM

## 2023-12-31 DIAGNOSIS — C44329 Squamous cell carcinoma of skin of other parts of face: Secondary | ICD-10-CM | POA: Diagnosis not present

## 2023-12-31 LAB — RAD ONC ARIA SESSION SUMMARY
Course Elapsed Days: 14
Plan Fractions Treated to Date: 11
Plan Prescribed Dose Per Fraction: 2.5 Gy
Plan Total Fractions Prescribed: 20
Plan Total Prescribed Dose: 50 Gy
Reference Point Dosage Given to Date: 27.5 Gy
Reference Point Session Dosage Given: 2.5 Gy
Session Number: 11

## 2023-12-31 MED ORDER — LENALIDOMIDE 10 MG PO CAPS: 10.0000 mg | ORAL_CAPSULE | Freq: Every day | ORAL | 0 refills | Status: DC

## 2024-01-01 ENCOUNTER — Other Ambulatory Visit: Payer: Self-pay

## 2024-01-01 ENCOUNTER — Ambulatory Visit
Admission: RE | Admit: 2024-01-01 | Discharge: 2024-01-01 | Disposition: A | Discharge status: Home / Self Care | Source: Ambulatory Visit | Attending: Radiation Oncology | Admitting: Radiation Oncology

## 2024-01-01 DIAGNOSIS — C44329 Squamous cell carcinoma of skin of other parts of face: Secondary | ICD-10-CM | POA: Diagnosis not present

## 2024-01-01 LAB — RAD ONC ARIA SESSION SUMMARY
Course Elapsed Days: 15
Plan Fractions Treated to Date: 12
Plan Prescribed Dose Per Fraction: 2.5 Gy
Plan Total Fractions Prescribed: 20
Plan Total Prescribed Dose: 50 Gy
Reference Point Dosage Given to Date: 30 Gy
Reference Point Session Dosage Given: 2.5 Gy
Session Number: 12

## 2024-01-02 ENCOUNTER — Other Ambulatory Visit: Payer: Self-pay

## 2024-01-02 ENCOUNTER — Ambulatory Visit
Admission: RE | Admit: 2024-01-02 | Discharge: 2024-01-02 | Disposition: A | Discharge status: Home / Self Care | Source: Ambulatory Visit | Attending: Radiation Oncology | Admitting: Radiation Oncology

## 2024-01-02 DIAGNOSIS — C44329 Squamous cell carcinoma of skin of other parts of face: Secondary | ICD-10-CM | POA: Diagnosis not present

## 2024-01-02 LAB — RAD ONC ARIA SESSION SUMMARY
Course Elapsed Days: 16
Plan Fractions Treated to Date: 13
Plan Prescribed Dose Per Fraction: 2.5 Gy
Plan Total Fractions Prescribed: 20
Plan Total Prescribed Dose: 50 Gy
Reference Point Dosage Given to Date: 32.5 Gy
Reference Point Session Dosage Given: 2.5 Gy
Session Number: 13

## 2024-01-03 ENCOUNTER — Ambulatory Visit
Admission: RE | Admit: 2024-01-03 | Discharge: 2024-01-03 | Disposition: A | Discharge status: Home / Self Care | Source: Ambulatory Visit | Attending: Radiation Oncology | Admitting: Radiation Oncology

## 2024-01-03 ENCOUNTER — Other Ambulatory Visit: Payer: Self-pay

## 2024-01-03 DIAGNOSIS — C44329 Squamous cell carcinoma of skin of other parts of face: Secondary | ICD-10-CM | POA: Diagnosis not present

## 2024-01-03 LAB — RAD ONC ARIA SESSION SUMMARY
Course Elapsed Days: 17
Plan Fractions Treated to Date: 14
Plan Prescribed Dose Per Fraction: 2.5 Gy
Plan Total Fractions Prescribed: 20
Plan Total Prescribed Dose: 50 Gy
Reference Point Dosage Given to Date: 35 Gy
Reference Point Session Dosage Given: 2.5 Gy
Session Number: 14

## 2024-01-04 ENCOUNTER — Other Ambulatory Visit: Payer: Self-pay

## 2024-01-04 ENCOUNTER — Ambulatory Visit
Admission: RE | Admit: 2024-01-04 | Discharge: 2024-01-04 | Disposition: A | Discharge status: Home / Self Care | Source: Ambulatory Visit | Attending: Radiation Oncology | Admitting: Radiation Oncology

## 2024-01-04 DIAGNOSIS — D333 Benign neoplasm of cranial nerves: Secondary | ICD-10-CM | POA: Diagnosis not present

## 2024-01-04 DIAGNOSIS — Z510 Encounter for antineoplastic radiation therapy: Secondary | ICD-10-CM | POA: Diagnosis not present

## 2024-01-04 DIAGNOSIS — C44329 Squamous cell carcinoma of skin of other parts of face: Secondary | ICD-10-CM | POA: Diagnosis not present

## 2024-01-04 LAB — RAD ONC ARIA SESSION SUMMARY
Course Elapsed Days: 18
Plan Fractions Treated to Date: 15
Plan Prescribed Dose Per Fraction: 2.5 Gy
Plan Total Fractions Prescribed: 20
Plan Total Prescribed Dose: 50 Gy
Reference Point Dosage Given to Date: 37.5 Gy
Reference Point Session Dosage Given: 2.5 Gy
Session Number: 15

## 2024-01-08 ENCOUNTER — Ambulatory Visit
Admission: RE | Admit: 2024-01-08 | Discharge: 2024-01-08 | Disposition: A | Discharge status: Home / Self Care | Source: Ambulatory Visit | Attending: Radiation Oncology | Admitting: Radiation Oncology

## 2024-01-08 ENCOUNTER — Other Ambulatory Visit: Payer: Self-pay

## 2024-01-08 DIAGNOSIS — C44329 Squamous cell carcinoma of skin of other parts of face: Secondary | ICD-10-CM | POA: Diagnosis not present

## 2024-01-08 LAB — RAD ONC ARIA SESSION SUMMARY
Course Elapsed Days: 22
Plan Fractions Treated to Date: 16
Plan Prescribed Dose Per Fraction: 2.5 Gy
Plan Total Fractions Prescribed: 20
Plan Total Prescribed Dose: 50 Gy
Reference Point Dosage Given to Date: 40 Gy
Reference Point Session Dosage Given: 2.5 Gy
Session Number: 16

## 2024-01-09 ENCOUNTER — Other Ambulatory Visit: Payer: Self-pay

## 2024-01-09 ENCOUNTER — Ambulatory Visit
Admission: RE | Admit: 2024-01-09 | Discharge: 2024-01-09 | Disposition: A | Discharge status: Home / Self Care | Source: Ambulatory Visit | Attending: Radiation Oncology

## 2024-01-09 DIAGNOSIS — C44329 Squamous cell carcinoma of skin of other parts of face: Secondary | ICD-10-CM | POA: Diagnosis not present

## 2024-01-09 LAB — RAD ONC ARIA SESSION SUMMARY
Course Elapsed Days: 23
Plan Fractions Treated to Date: 17
Plan Prescribed Dose Per Fraction: 2.5 Gy
Plan Total Fractions Prescribed: 20
Plan Total Prescribed Dose: 50 Gy
Reference Point Dosage Given to Date: 42.5 Gy
Reference Point Session Dosage Given: 2.5 Gy
Session Number: 17

## 2024-01-10 ENCOUNTER — Other Ambulatory Visit: Payer: Self-pay

## 2024-01-10 ENCOUNTER — Ambulatory Visit
Admission: RE | Admit: 2024-01-10 | Discharge: 2024-01-10 | Disposition: A | Discharge status: Home / Self Care | Source: Ambulatory Visit | Attending: Radiation Oncology

## 2024-01-10 ENCOUNTER — Ambulatory Visit

## 2024-01-10 VITALS — Ht 69.0 in | Wt 198.0 lb

## 2024-01-10 DIAGNOSIS — Z0000 Encounter for general adult medical examination without abnormal findings: Secondary | ICD-10-CM

## 2024-01-10 DIAGNOSIS — C44329 Squamous cell carcinoma of skin of other parts of face: Secondary | ICD-10-CM | POA: Diagnosis not present

## 2024-01-10 LAB — RAD ONC ARIA SESSION SUMMARY
Course Elapsed Days: 24
Plan Fractions Treated to Date: 18
Plan Prescribed Dose Per Fraction: 2.5 Gy
Plan Total Fractions Prescribed: 20
Plan Total Prescribed Dose: 50 Gy
Reference Point Dosage Given to Date: 45 Gy
Reference Point Session Dosage Given: 2.5 Gy
Session Number: 18

## 2024-01-10 NOTE — Progress Notes (Signed)
 Subjective:   Wesley [name] is a 83 y.o. who presents for a Medicare Wellness preventive visit.  As a reminder, Annual Wellness Visits don't include a physical exam, and some assessments may be limited, especially if this visit is performed virtually. We may recommend an in-person follow-up visit with your provider if needed.  Visit Complete: Virtual I connected with  Wesley [name] on 01/10/24 by a audio enabled telemedicine application and verified that I am speaking with the correct person using two identifiers.  Patient Location: Home  Provider Location: Office/Clinic  I discussed the limitations of evaluation and management by telemedicine. The patient expressed understanding and agreed to proceed.  Vital Signs: Because this visit was a virtual/telehealth visit, some criteria may be missing or patient reported. Any vitals not documented were not able to be obtained and vitals that have been documented are patient reported.  VideoDeclined- This patient declined Librarian, academic. Therefore the visit was completed with audio only.  Persons Participating in Visit: Patient.  AWV Questionnaire: Yes: Patient Medicare AWV questionnaire was completed by the patient on 01/08/24; I have confirmed that all information answered by patient is correct and no changes since this date.  Cardiac Risk Factors include: advanced age (>66men, >67 women);male gender;hypertension;dyslipidemia     Objective:     Today's Vitals   01/10/24 1412  Weight: 198 lb (89.8 kg)  Height: 5\' 9"  (1.753 m)   Body mass index is 29.24 kg/m.     01/10/2024    2:22 PM 11/26/2023   10:56 AM 05/14/2023    9:36 AM 06/19/2022   12:19 PM 05/08/2022    4:26 PM 04/24/2022   11:11 AM 04/14/2022    9:43 AM  Advanced Directives  Does Patient Have a Medical Advance Directive? No No No No No No No  Would patient like information on creating a medical advance directive? No - Patient declined  Yes (MAU/Ambulatory/Procedural Areas - Information given) No - Patient declined No - Patient declined No - Patient declined No - Patient declined No - Patient declined    Current Medications (verified) Outpatient Encounter Medications as of 01/10/2024  Medication Sig   acetaminophen  (TYLENOL ) 500 MG tablet Take 500 mg by mouth every 6 (six) hours as needed.   aspirin  EC 81 MG tablet Take 81 mg by mouth daily. Swallow whole.   atorvastatin  (LIPITOR) 80 MG tablet TAKE 1 TABLET EVERY DAY (NEED MD APPOINTMENT FOR REFILLS)   Cholecalciferol (VITAMIN D3) 25 MCG (1000 UT) CAPS Take 1,000 Units by mouth daily.   fexofenadine (ALLEGRA) 180 MG tablet    finasteride  (PROSCAR ) 5 MG tablet Take 5 mg by mouth daily.   fluticasone  (FLONASE ) 50 MCG/ACT nasal spray Place into the nose.   Ibuprofen  200 MG CAPS    lenalidomide  (REVLIMID ) 10 MG capsule Take 1 capsule (10 mg total) by mouth daily. Take 1 capsule daily for 21 days and then none for 7 days.   lisinopril  (ZESTRIL ) 10 MG tablet TAKE 1 TABLET EVERY DAY   Magnesium  Oxide 400 MG CAPS    melatonin 5 MG TABS    Omega-3 Fatty Acids (FISH OIL) 1200 MG CAPS Take 1,200 mg by mouth daily.   vitamin B-12 (CYANOCOBALAMIN ) 1000 MCG tablet Take 1,000 mcg by mouth daily.   [DISCONTINUED] pantoprazole (PROTONIX) 40 MG tablet Take by mouth.   [DISCONTINUED] traMADol  (ULTRAM ) 50 MG tablet Take 1 tablet (50 mg total) by mouth every 6 (six) hours as needed.   No  facility-administered encounter medications on file as of 01/10/2024.    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 PT denies significant findings   CARDIAC CATHETERIZATION  2008   COLOSTOMY N/A 10/31/2019   Procedure: COLOSTOMY;  Surgeon: Sim Dryer, MD;  Location: [facility];  Service: General;  Laterality: N/A;   CYSTOSCOPY W/ URETERAL STENT PLACEMENT Bilateral 10/31/2019   Procedure: CYSTOSCOPY URETERAL STENT PLACEMENT BILATERAL;  Surgeon: Trent Frizzle, MD;  Location: [facility];  Service: Urology;  Laterality: Bilateral;   CYSTOSCOPY WITH STENT PLACEMENT Bilateral 03/11/2020   Procedure: CYSTOSCOPY WITH BILATERAL FIREFLY INJECTION;  Surgeon: Homero Luster, MD;  Location: [facility];  Service: Urology;  Laterality: Bilateral;   EYE SURGERY     BILATERAL CATARACT SURGERY WITH LENS IMPLANTS   FLEXIBLE SIGMOIDOSCOPY N/A 10/29/2019   Procedure: FLEXIBLE SIGMOIDOSCOPY;  Surgeon: Nannette Babe, MD;  [facility];  Service: Gastroenterology;  Laterality: N/A;   INCISIONAL HERNIA REPAIR N/A 10/13/2020   Procedure: OPEN INCISIONAL HERNIA REPAIR WITH MESH;  Surgeon: Shela Derby, MD;  Location: [facility] OR;  Service: General;  Laterality: N/A;   INSERTION OF MESH N/A 09/08/2016   Procedure: INSERTION OF MESH;  Surgeon: Adalberto Hollow, MD;  [facility];  Service: General;  Laterality: N/A;   IR RADIOLOGIST EVAL & MGMT  11/10/2020   IR RADIOLOGIST EVAL & MGMT  11/25/2020   LAPAROSCOPIC SIGMOID COLECTOMY N/A 10/31/2019   Procedure:  DIAGNOSTIC LAPAROSCOPY; EXPLORATORY LAPAROTOMY; SIGMOID COLECTOMY;  Surgeon: Sim Dryer, MD;  Location[facility];  Service: General;  Laterality: N/A;   right rotator cuff     SUBMUCOSAL TATTOO INJECTION  10/29/2019   Procedure: SUBMUCOSAL TATTOO INJECTION;  Surgeon: Nannette Babe, MD;  [facility];  Service: Gastroenterology;;   TONSILLECTOMY     UMBILICAL HERNIA REPAIR N/A 09/08/2016   Procedure: OPEN UMBILICAL HERNIA REPAIR WITH MESH;  Surgeon: Adalberto Hollow, MD;  [facility];  Service: General;  Laterality: N/A;   VENTRAL HERNIA REPAIR N/A 02/03/2021   Procedure: LAPAROSCOPIC VENTRAL HERNIA REPAIR WITH MESH;  Surgeon: Shela Derby, MD;  Location[facility];  Service: General;  Laterality: N/A;   XI ROBOTIC ASSISTED COLOSTOMY TAKEDOWN N/A 03/11/2020   Procedure: ROBOTIC ASSISTED COLOSTOMY REVERSAL, RIGID PROCTOSCOPY;  Surgeon: Joyce Nixon, MD;  Location: [facility];  Service: General;  Laterality: N/A;   Family History  Problem Relation Age of Onset   Diabetes Mother    Heart failure  Mother        around 38   CAD Father        61   Melanoma Brother 56   Healthy Daughter    Healthy Son    Colon cancer Neg Hx    Esophageal cancer Neg Hx    Rectal cancer Neg Hx    Stomach cancer Neg Hx    Social History   Socioeconomic History   Marital status: Married    Spouse name: Neizan Debruhl    Number of children: 2   Years of education: 12   Highest education level: Bachelor's degree (e.g., BA, AB, BS)  Occupational History   Occupation: Retired   Tobacco Use   Smoking status: Never   Smokeless tobacco: Never  Vaping Use   Vaping status: Never Used  Substance and Sexual Activity   Alcohol use: Yes    Alcohol/week: 17.0 standard drinks of alcohol    Types: 3 Glasses of wine, 14 Standard drinks or equivalent per week   Drug use: No   Sexual activity: Not Currently  Other Topics Concern   Not on file  Social History Narrative   Married. 2 children 52 in 48 in  2022- both went to [employer]. 5 grandkids- all girls from oldest 56 looking premed [employer], 5 soph [place], 2 51 year olds, 10 year olds.       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 [employer]: fishing, [employer] football and basketball, [place] [employer], yardwork   Social Drivers of Health   Financial Resource Strain: Low Risk  (01/10/2024)   Overall Financial Resource Strain (CARDIA)    Difficulty of Paying Living Expenses: Not hard at all  Food Insecurity: No Food Insecurity (01/10/2024)   Hunger Vital Sign    Worried About Programme researcher, broadcasting/film/video in the Last Year: Never true    Ran Out of Food in the Last Year: Never true  Transportation Needs: No Transportation Needs (01/10/2024)   PRAPARE - Administrator, Civil Service (Medical): No    Lack of Transportation (Non-Medical): No  Physical Activity: Inactive (01/10/2024)   Exercise Vital Sign    Days of Exercise per Week: 0 days    Minutes of Exercise per Session: 0 min  Stress: No Stress Concern Present (01/10/2024)   [employer] of Occupational Health - Occupational Stress Questionnaire    Feeling of Stress : Only a little  Social Connections: Socially Integrated (01/10/2024)   Social Connection and Isolation Panel [NHANES]    Frequency of Communication with Friends and Family: More than three times a week    Frequency of Social Gatherings with Friends and Family: Twice a week    Attends Religious Services: More than 4 times per year    Active Member of [employer] or Organizations: Yes    Attends Engineer, structural: More than 4 times per year    Marital Status: Married    Tobacco Counseling Counseling given: Not Answered    Clinical Intake:  Pre-visit preparation completed: Yes  Pain : No/denies pain     BMI - recorded: 29.24 Nutritional Status: BMI 25 -29 Overweight Nutritional Risks:  None Diabetes: No  Lab Results  Component Value Date   HGBA1C 5.6 10/30/2019     How often do you need to have someone help you when you read instructions,  pamphlets, or other written materials from your doctor or pharmacy?: 1 - Never  Interpreter Needed?: No  Information entered by :: Lamont Pilsner, LPN   Activities of Daily Living     01/08/2024   11:43 AM  In your present state of health, do you have any difficulty performing the following activities:  Hearing? 0  Vision? 0  Difficulty concentrating or making decisions? 0  Walking or climbing stairs? 0  Dressing or bathing? 0  Doing errands, shopping? 0  Preparing Food and eating ? N  Using the Toilet? N  In the past six months, have you accidently leaked urine? Y  Comment at times  Do you have problems with loss of bowel control? N  Managing your Medications? N  Managing your Finances? N  Housekeeping or managing your Housekeeping? N    Patient Care Team: Almira Jaeger, MD as PCP - General (Family Medicine) Hugh Madura, MD as PCP - Cardiology (Cardiology) Shela Derby, MD as Consulting Physician (General Surgery) Harlen Lick, MD as Attending Physician (Dermatology) Vaslow, Zachary K, MD as Consulting Physician (Psychiatry) Ander Bame, MD as Consulting Physician (Hematology and Oncology) Colie Dawes, MD as Consulting Physician (Radiation Oncology) Malmfelt, Nancyann Aye, RN as Oncology Nurse Navigator  Indicate any recent Medical Services you may have received from other than [facility] providers in the past year (date may be approximate).     Assessment:    This is a routine wellness examination for Wesley [name].  Hearing/Vision screen Hearing Screening - Comments:: Pt denies any hearing issues  Vision Screening - Comments:: Wears rx glasses - up to date with routine eye exams with [facility] Dr Juanito Norma     Goals Addressed             This Visit's Progress    Patient Stated        Maintain health and activity        Depression Screen     01/10/2024    2:24 PM 11/15/2023    1:38 PM 05/08/2022    4:49 PM 05/07/2021   11:23 AM 04/11/2021    2:22 PM 01/06/2021    1:22 PM  PHQ 2/9 Scores  PHQ - 2 Score 0 0 0 0 0 0  PHQ- 9 Score 0 3  0 0 6    Fall Risk     01/08/2024   11:43 AM 11/15/2023    1:09 PM 05/08/2022    4:22 PM 05/07/2021   11:30 AM 04/11/2021    2:21 PM  Fall Risk   Falls in the past year? 1 1 1 1 1   Number falls in past yr: 0 0 0 1 1  Injury with Fall? 1 0 1 0 1  Risk for fall due to : Impaired mobility;Impaired balance/gait History of fall(s) History of fall(s);Impaired balance/gait;Impaired mobility Other (Comment) Other (Comment)  Risk for fall due to: Comment    currently experiencing vertigo   Follow up Falls prevention discussed Falls evaluation completed Falls evaluation completed Falls prevention discussed Falls evaluation completed  Comment    currenlty under MD care for it, also getting physical therapy     MEDICARE RISK AT HOME:  Medicare Risk at Home Any stairs in or around the home?: (Patient-Rptd) Yes If so, are there any without handrails?: (Patient-Rptd) No Home free of loose throw rugs in walkways, pet beds, electrical cords, etc?: (Patient-Rptd) Yes Adequate lighting in your home to reduce risk of falls?: (Patient-Rptd) Yes Life alert?: (Patient-Rptd)  No Use of a cane, walker or w/c?: (Patient-Rptd) No Grab bars in the bathroom?: (Patient-Rptd) No Shower chair or bench in shower?: (Patient-Rptd) Yes Elevated toilet seat or a handicapped toilet?: (Patient-Rptd) Yes  TIMED UP AND GO:  Was the test performed?  No  Cognitive Function: 6CIT completed        01/10/2024    2:27 PM 05/08/2022    4:27 PM 05/07/2021   11:35 AM  6CIT Screen  What Year? 0 points 0 points 0 points  What month? 0 points 0 points 0 points  What time? 0 points 0 points 0 points  Count back from 20 0 points 0 points 0 points  Months in reverse 0  points 0 points 0 points  Repeat phrase 0 points 0 points 0 points  Total Score 0 points 0 points 0 points    Immunizations Immunization History  Administered Date(s) Administered   Influenza, High Dose Seasonal PF 06/10/2014, 06/28/2015, 04/18/2016, 07/27/2017, 06/04/2018   Influenza, Quadrivalent, Recombinant, Inj, Pf 06/04/2018, 05/13/2019   Influenza-Unspecified 04/29/2012, 07/14/2013, 05/14/2018, 06/01/2021   PFIZER(Purple Top)SARS-COV-2 Vaccination 09/08/2019, 09/29/2019, 05/13/2020, 12/13/2020   Pfizer Covid-19 Vaccine Bivalent Booster 49yrs & up 06/01/2021   Pneumococcal Conjugate-13 01/05/2015, 05/13/2019   Pneumococcal Polysaccharide-23 09/20/2011   Td 04/29/2006, 12/08/2019   Zoster Recombinant(Shingrix) 04/06/2020   Zoster, Live 04/29/2008    Screening Tests Health Maintenance  Topic Date Due   Zoster Vaccines- Shingrix (2 of 2) 06/01/2020   COVID-19 Vaccine (6 - 2024-25 season) 04/15/2023   INFLUENZA VACCINE  03/14/2024   Medicare Annual Wellness (AWV)  01/09/2025   DTaP/Tdap/Td (3 - Tdap) 12/07/2029   Pneumonia Vaccine 49+ Years old  Completed   HPV VACCINES  Aged Out   Meningococcal B Vaccine  Aged Out   Hepatitis C Screening  Discontinued    Health Maintenance  Health Maintenance Due  Topic Date Due   Zoster Vaccines- Shingrix (2 of 2) 06/01/2020   COVID-19 Vaccine (6 - 2024-25 season) 04/15/2023   Health Maintenance Items Addressed: See Nurse Notes  Additional Screening:  Vision Screening: Recommended annual ophthalmology exams for early detection of glaucoma and other disorders of the eye.  Dental Screening: Recommended annual dental exams for proper oral hygiene  Community Resource Referral / Chronic Care Management: CRR required this visit?  No   CCM required this visit?  No   Plan:    I have personally reviewed and noted the following in the patient's chart:   Medical and social history Use of alcohol, tobacco or illicit drugs   Current medications and supplements including opioid prescriptions. Patient is not currently taking opioid prescriptions. Functional ability and status Nutritional status Physical activity Advanced directives List of other physicians Hospitalizations, surgeries, and ER visits in previous 12 months Vitals Screenings to include cognitive, depression, and falls Referrals and appointments  In addition, I have reviewed and discussed with patient certain preventive protocols, quality metrics, and best practice recommendations. A written personalized care plan for preventive services as well as general preventive health recommendations were provided to patient.   Bruno Capri, LPN   [id number]   After Visit Summary: (MyChart) Due to this being a telephonic visit, the after visit summary with patients personalized plan was offered to patient via MyChart   Notes: Nothing significant to report at this time.

## 2024-01-10 NOTE — Patient Instructions (Signed)
 Mr. Wesley [name] , Thank you for taking time out of your busy schedule to complete your Annual Wellness Visit with me. I enjoyed our conversation and look forward to speaking with you again next year. I, as well as your care team,  appreciate your ongoing commitment to your health goals. Please review the following plan we discussed and let me know if I can assist you in the future. Your Game plan/ To Do List    Referrals: If you haven't heard from the office you've been referred to, please reach out to them at the phone provided.   Follow up Visits: Next Medicare AWV with our clinical staff: 01/22/25   Have you seen your provider in the last 6 months (3 months if uncontrolled diabetes)? Yes Next Office Visit with your provider: 4/6/265  Clinician Recommendations:  Each day, aim for 6 glasses of water , plenty of protein in your diet and try to get up and walk/ stretch every hour for 5-10 minutes at a time.        This is a list of the screening recommended for you and due dates:  Health Maintenance  Topic Date Due   Zoster (Shingles) Vaccine (2 of 2) 06/01/2020   COVID-19 Vaccine (6 - 2024-25 season) 04/15/2023   Medicare Annual Wellness Visit  05/09/2023   Flu Shot  03/14/2024   DTaP/Tdap/Td vaccine (3 - Tdap) 12/07/2029   Pneumonia Vaccine  Completed   HPV Vaccine  Aged Out   Meningitis B Vaccine  Aged Out   Hepatitis C Screening  Discontinued    Advanced directives: (Declined) Advance directive discussed with you today. Even though you declined this today, please call our office should you change your mind, and we can give you the proper paperwork for you to fill out. Advance Care Planning is important because it:  [x]  Makes sure you receive the medical care that is consistent with your values, goals, and preferences  [x]  It provides guidance to your family and loved ones and reduces their decisional burden about whether or not they are making the right decisions based on your  wishes.  Follow the link provided in your after visit summary or read over the paperwork we have mailed to you to help you started getting your Advance Directives in place. If you need assistance in completing these, please reach out to us  so that we can help you!  See attachments for Preventive Care and Fall Prevention Tips.

## 2024-01-11 ENCOUNTER — Ambulatory Visit
Admission: RE | Admit: 2024-01-11 | Discharge: 2024-01-11 | Disposition: A | Discharge status: Home / Self Care | Source: Ambulatory Visit | Attending: Radiation Oncology | Admitting: Radiation Oncology

## 2024-01-11 ENCOUNTER — Other Ambulatory Visit: Payer: Self-pay

## 2024-01-11 DIAGNOSIS — C44329 Squamous cell carcinoma of skin of other parts of face: Secondary | ICD-10-CM | POA: Diagnosis not present

## 2024-01-11 LAB — RAD ONC ARIA SESSION SUMMARY
Course Elapsed Days: 25
Plan Fractions Treated to Date: 19
Plan Prescribed Dose Per Fraction: 2.5 Gy
Plan Total Fractions Prescribed: 20
Plan Total Prescribed Dose: 50 Gy
Reference Point Dosage Given to Date: 47.5 Gy
Reference Point Session Dosage Given: 2.5 Gy
Session Number: 19

## 2024-01-14 ENCOUNTER — Ambulatory Visit
Admission: RE | Admit: 2024-01-14 | Discharge: 2024-01-14 | Disposition: A | Discharge status: Home / Self Care | Source: Ambulatory Visit | Attending: Radiation Oncology | Admitting: Radiation Oncology

## 2024-01-14 ENCOUNTER — Other Ambulatory Visit: Payer: Self-pay

## 2024-01-14 DIAGNOSIS — D333 Benign neoplasm of cranial nerves: Secondary | ICD-10-CM | POA: Diagnosis not present

## 2024-01-14 DIAGNOSIS — Z510 Encounter for antineoplastic radiation therapy: Secondary | ICD-10-CM | POA: Diagnosis not present

## 2024-01-14 DIAGNOSIS — C44329 Squamous cell carcinoma of skin of other parts of face: Secondary | ICD-10-CM | POA: Insufficient documentation

## 2024-01-14 LAB — RAD ONC ARIA SESSION SUMMARY
Course Elapsed Days: 28
Plan Fractions Treated to Date: 20
Plan Prescribed Dose Per Fraction: 2.5 Gy
Plan Total Fractions Prescribed: 20
Plan Total Prescribed Dose: 50 Gy
Reference Point Dosage Given to Date: 50 Gy
Reference Point Session Dosage Given: 2.5 Gy
Session Number: 20

## 2024-01-14 NOTE — Progress Notes (Signed)
 Oncology Nurse Navigator Documentation   Met with Wesley [name] and his wife after final RT to offer support and to celebrate end of radiation treatment.   Provided verbal post-RT guidance: Importance of keeping all follow-up appts.. Importance of protecting treatment area from sun. Continuation of Sonafine application 2-3 times daily, application of antibiotic ointment to areas of raw skin; when supply of Sonafine exhausted transition to OTC lotion with vitamin E.  Explained my role as navigator will continue for several more months, encouraged him to call me with needs/concerns.    Lynetta Saran RN, BSN, OCN Head & Neck Oncology Nurse Navigator [facility] Phone # [phone]  Fax # [phone]

## 2024-01-15 ENCOUNTER — Other Ambulatory Visit: Payer: Self-pay | Admitting: Hematology and Oncology

## 2024-01-15 DIAGNOSIS — C9001 Multiple myeloma in remission: Secondary | ICD-10-CM

## 2024-01-15 NOTE — Radiation Completion Notes (Signed)
 Patient Name: [name], [name] MRN: [id number] Date of Birth: 1941-03-15 Referring Physician: Moira Andrews, M.D. Date of Service: 2024-01-15 Radiation Oncologist: Colie Dawes, M.D. 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, M=cM0 Intent: Curative     ==========DELIVERED PLANS==========  First Treatment Date: 2023-12-17 Last Treatment Date: 2024-01-14   Plan Name: [id number] Site: Face Technique: Electron Mode: Electron Dose Per Fraction: 2.5 Gy Prescribed Dose (Delivered / Prescribed): 50 Gy / 50 Gy Prescribed Fxs (Delivered / Prescribed): 20 / 20     ==========ON TREATMENT VISIT DATES========== 2023-12-17, 2023-12-24, 2023-12-31, 2024-01-14     ==========UPCOMING VISITS========== 02/19/2024 [facility]-RADIATION ONC FOLLOW UP 20 Pearlene Bouchard, [place]  02/13/2024 [facility]-MED ONCOLOGY INJECTION [facility] MED[facility]C FLUSH  02/13/2024 [facility]MED ONCOLOGY LAB [facility] LAB  02/13/2024 [facility]-MED ONCOLOGY EST PT 20 Ander Bame, [place]  01/16/2024 [facility] ONCOLOGY INJECTION [facility] FLUSH  01/16/2024 [facility] ONCOLOGY LAB [facility] LAB        ==========APPENDIX - ON TREATMENT VISIT NOTES==========   See weekly On Treatment Notes in Epic for details in the Media tab (listed as Progress notes on the On Treatment Visit Dates listed above).

## 2024-01-16 ENCOUNTER — Inpatient Hospital Stay: Payer: Medicare Other | Attending: Hematology and Oncology

## 2024-01-16 ENCOUNTER — Inpatient Hospital Stay: Payer: Medicare Other

## 2024-01-16 VITALS — BP 133/83 | HR 57 | Temp 98.1°F | Resp 20

## 2024-01-16 DIAGNOSIS — Z79899 Other long term (current) drug therapy: Secondary | ICD-10-CM | POA: Diagnosis not present

## 2024-01-16 DIAGNOSIS — C9000 Multiple myeloma not having achieved remission: Secondary | ICD-10-CM | POA: Diagnosis not present

## 2024-01-16 DIAGNOSIS — C9001 Multiple myeloma in remission: Secondary | ICD-10-CM

## 2024-01-16 LAB — CBC WITH DIFFERENTIAL (CANCER CENTER ONLY)
Abs Immature Granulocytes: 0.02 10*3/uL (ref 0.00–0.07)
Basophils Absolute: 0.1 10*3/uL (ref 0.0–0.1)
Basophils Relative: 2 %
Eosinophils Absolute: 0.1 10*3/uL (ref 0.0–0.5)
Eosinophils Relative: 2 %
HCT: 42.4 % (ref 39.0–52.0)
Hemoglobin: 14.9 g/dL (ref 13.0–17.0)
Immature Granulocytes: 0 %
Lymphocytes Relative: 21 %
Lymphs Abs: 1.2 10*3/uL (ref 0.7–4.0)
MCH: 36.2 pg — ABNORMAL HIGH (ref 26.0–34.0)
MCHC: 35.1 g/dL (ref 30.0–36.0)
MCV: 102.9 fL — ABNORMAL HIGH (ref 80.0–100.0)
Monocytes Absolute: 0.8 10*3/uL (ref 0.1–1.0)
Monocytes Relative: 15 %
Neutro Abs: 3.2 10*3/uL (ref 1.7–7.7)
Neutrophils Relative %: 60 %
Platelet Count: 197 10*3/uL (ref 150–400)
RBC: 4.12 MIL/uL — ABNORMAL LOW (ref 4.22–5.81)
RDW: 14.6 % (ref 11.5–15.5)
WBC Count: 5.4 10*3/uL (ref 4.0–10.5)
nRBC: 0 % (ref 0.0–0.2)

## 2024-01-16 LAB — CMP (CANCER CENTER ONLY)
ALT: 23 U/L (ref 0–44)
AST: 21 U/L (ref 15–41)
Albumin: 4.1 g/dL (ref 3.5–5.0)
Alkaline Phosphatase: 75 U/L (ref 38–126)
Anion gap: 6 (ref 5–15)
BUN: 15 mg/dL (ref 8–23)
CO2: 26 mmol/L (ref 22–32)
Calcium: 9 mg/dL (ref 8.9–10.3)
Chloride: 107 mmol/L (ref 98–111)
Creatinine: 1.14 mg/dL (ref 0.61–1.24)
GFR, Estimated: >60 mL/min (ref >60)
Glucose, Bld: 92 mg/dL (ref 70–99)
Potassium: 4.2 mmol/L (ref 3.5–5.1)
Sodium: 139 mmol/L (ref 135–145)
Total Bilirubin: 1.1 mg/dL (ref 0.0–1.2)
Total Protein: 6.9 g/dL (ref 6.5–8.1)

## 2024-01-16 LAB — LACTATE DEHYDROGENASE: LDH: 135 U/L (ref 98–192)

## 2024-01-16 MED ORDER — DENOSUMAB 120 MG/1.7ML ~~LOC~~ SOLN
120.0000 mg | Freq: Once | SUBCUTANEOUS | Status: AC
  Administered 2024-01-16: 120 mg via SUBCUTANEOUS
  Filled 2024-01-16: qty 1.7

## 2024-01-17 LAB — KAPPA/LAMBDA LIGHT CHAINS
Kappa free light chain: 18 mg/L (ref 3.3–19.4)
Kappa, lambda light chain ratio: 1.33 (ref 0.26–1.65)
Lambda free light chains: 13.5 mg/L (ref 5.7–26.3)

## 2024-01-18 LAB — MULTIPLE MYELOMA PANEL, SERUM
Albumin SerPl Elph-Mcnc: 3.5 g/dL (ref 2.9–4.4)
Albumin/Glob SerPl: 1.2 (ref 0.7–1.7)
Alpha 1: 0.3 g/dL (ref 0.0–0.4)
Alpha2 Glob SerPl Elph-Mcnc: 0.8 g/dL (ref 0.4–1.0)
B-Globulin SerPl Elph-Mcnc: 1.1 g/dL (ref 0.7–1.3)
Gamma Glob SerPl Elph-Mcnc: 0.8 g/dL (ref 0.4–1.8)
Globulin, Total: 3 g/dL (ref 2.2–3.9)
IgA: 244 mg/dL (ref 61–437)
IgG (Immunoglobin G), Serum: 858 mg/dL (ref 603–1613)
IgM (Immunoglobulin M), Srm: 55 mg/dL (ref 15–143)
Total Protein ELP: 6.5 g/dL (ref 6.0–8.5)

## 2024-01-23 ENCOUNTER — Other Ambulatory Visit: Payer: Self-pay | Admitting: Hematology and Oncology

## 2024-01-23 DIAGNOSIS — C9000 Multiple myeloma not having achieved remission: Secondary | ICD-10-CM

## 2024-01-24 ENCOUNTER — Other Ambulatory Visit: Payer: Self-pay | Admitting: *Deleted

## 2024-01-24 DIAGNOSIS — C9000 Multiple myeloma not having achieved remission: Secondary | ICD-10-CM

## 2024-01-24 MED ORDER — LENALIDOMIDE 10 MG PO CAPS: 10.0000 mg | ORAL_CAPSULE | Freq: Every day | ORAL | 0 refills | Status: DC

## 2024-02-13 ENCOUNTER — Inpatient Hospital Stay: Payer: Medicare Other | Attending: Hematology and Oncology | Admitting: Hematology and Oncology

## 2024-02-13 ENCOUNTER — Inpatient Hospital Stay: Payer: Medicare Other

## 2024-02-13 ENCOUNTER — Other Ambulatory Visit: Payer: Self-pay | Admitting: Hematology and Oncology

## 2024-02-13 VITALS — BP 125/71 | HR 57 | Temp 97.9°F | Resp 13 | Wt 198.5 lb

## 2024-02-13 DIAGNOSIS — Z79899 Other long term (current) drug therapy: Secondary | ICD-10-CM | POA: Insufficient documentation

## 2024-02-13 DIAGNOSIS — C9000 Multiple myeloma not having achieved remission: Secondary | ICD-10-CM

## 2024-02-13 DIAGNOSIS — C9001 Multiple myeloma in remission: Secondary | ICD-10-CM

## 2024-02-13 DIAGNOSIS — Z85828 Personal history of other malignant neoplasm of skin: Secondary | ICD-10-CM | POA: Diagnosis not present

## 2024-02-13 DIAGNOSIS — Z7961 Long term (current) use of immunomodulator: Secondary | ICD-10-CM | POA: Diagnosis not present

## 2024-02-13 DIAGNOSIS — Z7982 Long term (current) use of aspirin: Secondary | ICD-10-CM | POA: Diagnosis not present

## 2024-02-13 DIAGNOSIS — D649 Anemia, unspecified: Secondary | ICD-10-CM | POA: Diagnosis not present

## 2024-02-13 LAB — CMP (CANCER CENTER ONLY)
ALT: 28 U/L (ref 0–44)
AST: 21 U/L (ref 15–41)
Albumin: 4 g/dL (ref 3.5–5.0)
Alkaline Phosphatase: 70 U/L (ref 38–126)
Anion gap: 7 (ref 5–15)
BUN: 17 mg/dL (ref 8–23)
CO2: 26 mmol/L (ref 22–32)
Calcium: 9 mg/dL (ref 8.9–10.3)
Chloride: 106 mmol/L (ref 98–111)
Creatinine: 1.06 mg/dL (ref 0.61–1.24)
GFR, Estimated: >60 mL/min (ref >60)
Glucose, Bld: 110 mg/dL — ABNORMAL HIGH (ref 70–99)
Potassium: 4.3 mmol/L (ref 3.5–5.1)
Sodium: 139 mmol/L (ref 135–145)
Total Bilirubin: 1 mg/dL (ref 0.0–1.2)
Total Protein: 6.8 g/dL (ref 6.5–8.1)

## 2024-02-13 LAB — CBC WITH DIFFERENTIAL (CANCER CENTER ONLY)
Abs Immature Granulocytes: 0.02 10*3/uL (ref 0.00–0.07)
Basophils Absolute: 0.1 10*3/uL (ref 0.0–0.1)
Basophils Relative: 2 %
Eosinophils Absolute: 0.3 10*3/uL (ref 0.0–0.5)
Eosinophils Relative: 6 %
HCT: 44 % (ref 39.0–52.0)
Hemoglobin: 15.3 g/dL (ref 13.0–17.0)
Immature Granulocytes: 1 %
Lymphocytes Relative: 26 %
Lymphs Abs: 1.1 10*3/uL (ref 0.7–4.0)
MCH: 36.3 pg — ABNORMAL HIGH (ref 26.0–34.0)
MCHC: 34.8 g/dL (ref 30.0–36.0)
MCV: 104.5 fL — ABNORMAL HIGH (ref 80.0–100.0)
Monocytes Absolute: 0.7 10*3/uL (ref 0.1–1.0)
Monocytes Relative: 15 %
Neutro Abs: 2.2 10*3/uL (ref 1.7–7.7)
Neutrophils Relative %: 50 %
Platelet Count: 165 10*3/uL (ref 150–400)
RBC: 4.21 MIL/uL — ABNORMAL LOW (ref 4.22–5.81)
RDW: 14.5 % (ref 11.5–15.5)
WBC Count: 4.3 10*3/uL (ref 4.0–10.5)
nRBC: 0 % (ref 0.0–0.2)

## 2024-02-13 LAB — LACTATE DEHYDROGENASE: LDH: 124 U/L (ref 98–192)

## 2024-02-13 MED ORDER — DENOSUMAB 120 MG/1.7ML ~~LOC~~ SOLN
120.0000 mg | Freq: Once | SUBCUTANEOUS | Status: AC
  Administered 2024-02-13: 120 mg via SUBCUTANEOUS
  Filled 2024-02-13: qty 1.7

## 2024-02-13 NOTE — Progress Notes (Signed)
 [facility] [phone]   [phone]  PROGRESS NOTE  Patient Care Team: [name], MD as PCP - General (Family Medicine) Jeffrie Oneil BROCKS, MD as PCP - Cardiology (Cardiology) Rubin Calamity, MD as Consulting Physician (General Surgery) Joshua Blamer, MD as Attending Physician (Dermatology) Buckley Zachary K, MD as Consulting Physician (Psychiatry) [name] Norleen ONEIDA MADISON, MD as Consulting Physician (Hematology and Oncology) Izell Domino, MD as Consulting Physician (Radiation Oncology) Malmfelt, Delon CROME, RN as Oncology Nurse Navigator  Hematological/Oncological History # 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 Further workup showed SPEP M-Spike 4.2 g/dL with IgG-Kappa as monoclonal protein, IgG 5908 mg/dL 8/73/7976: Initial evaluation at [facility]. bone marrow biopsy performed, showed 60% abnormal plasma cells in 60% cellular marrow. Labs showed kappa 35.28 mg/dL, lambda 9.22 mg/dL, K/L ratio 54.17 and M-Spike 4.48 g/dL, IgG kappa monoclonal gammopathy 09/15/2021: CT skeletal survey showed no CT evidence of aggressive osseous lesions.  09/26/2021: transfer care to Dr. [name] at [facility].  09/30/2021: Cycle 1 Day 1 of Dara/Dex 10/06/2021: Cycle 1 Day 8 (added revlimid ) 10/28/2021: Cycle 2 Day 1 of Dara/Rev/Dex 11/25/2021: Cycle 3 Day 1 of Dara/Rev/Dex 5/12/023: Cycle 4 Day 1 of Dara/Rev/Dex 01/20/2022: Cycle 5 Day 1 of Dara/Rev/Dex 02/17/2022: Cycle 6 Day 1 of Dara/Rev/Dex 03/17/2022: Cycle 7 Day 1 of Dara/Rev/Dex 04/14/2022: Cycle 8 Day 1 of Dara/Rev/Dex 05/22/2022: Cycle 9 Day 1 of Dara/Rev/Dex 06/19/2022: Cycle 10 Day 1 of Dara/Rev/Dex 07/17/2022: Cycle 11 Day 1 of Dara/Rev/Dex 08/17/2022: start maintenance revlimid  10 mg PO daily 21 of 28 days.   Interval History:  Wesley [name] 83 y.o. male with medical history significant for IgG kappa multiple myeloma who  presents for a follow up visit. The patient's last visit was on 11/21/2023. In the interim since the last visit he continued treatment with maintenance revlimid .   On exam today Wesley [name] reports he has been well overall in the interim since our last visit.  He reports that he recently had Mohs surgery and radiation and is still recovering from that.  He reports his energy is about a 5 out of 10.  He reports that he has already gone to the beach this season and went back in April.  He reports he is tolerating his Revlimid  pills well and they are not causing any nausea, vomiting, or diarrhea.  He is not having any bone or back pain.  He reports nothing else out of the ordinary in the interim since our last visit.  He has no questions concerns or complaints.  A full 10 point ROS is otherwise negative.  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 PT denies significant findings   CARDIAC CATHETERIZATION  2008   COLOSTOMY N/A 10/31/2019   Procedure: COLOSTOMY;  Surgeon: Vanderbilt Ned, MD;   Location: [facility];  Service: General;  Laterality: N/A;   CYSTOSCOPY W/ URETERAL STENT PLACEMENT Bilateral 10/31/2019   Procedure: CYSTOSCOPY URETERAL STENT PLACEMENT BILATERAL;  Surgeon: Matilda Senior, MD;  Location: [facility];  Service: Urology;  Laterality: Bilateral;   CYSTOSCOPY WITH STENT PLACEMENT Bilateral 03/11/2020   Procedure: CYSTOSCOPY WITH BILATERAL FIREFLY INJECTION;  Surgeon: Watt Rush, MD;  Location: [facility];  Service: Urology;  Laterality: Bilateral;   EYE SURGERY     BILATERAL CATARACT SURGERY WITH LENS IMPLANTS   FLEXIBLE SIGMOIDOSCOPY N/A 10/29/2019   Procedure: FLEXIBLE SIGMOIDOSCOPY;  Surgeon: Albertus Gordy HERO, MD;  Location: [facility];  Service: Gastroenterology;  Laterality: N/A;   INCISIONAL HERNIA REPAIR N/A 10/13/2020   Procedure: OPEN INCISIONAL HERNIA REPAIR WITH MESH;  Surgeon: Rubin Calamity, MD;  Location: [facility];  Service: General;  Laterality: N/A;   INSERTION OF MESH N/A 09/08/2016   Procedure: INSERTION OF MESH;  Surgeon: Krystal Russell, MD;  Location: [facility];  Service: General;  Laterality: N/A;   IR RADIOLOGIST EVAL & MGMT  11/10/2020   IR RADIOLOGIST EVAL & MGMT  11/25/2020   LAPAROSCOPIC SIGMOID COLECTOMY N/A 10/31/2019   Procedure: DIAGNOSTIC LAPAROSCOPY; EXPLORATORY LAPAROTOMY; SIGMOID COLECTOMY;  Surgeon: Vanderbilt Ned, MD;  Location: [facility];  Service: General;  Laterality: N/A;   right rotator cuff     SUBMUCOSAL TATTOO INJECTION  10/29/2019   Procedure: SUBMUCOSAL TATTOO INJECTION;  Surgeon: Albertus Gordy HERO, MD;  [facility]: [facility];  Service: Gastroenterology;;   TONSILLECTOMY     UMBILICAL HERNIA REPAIR N/A 09/08/2016   Procedure: OPEN UMBILICAL HERNIA REPAIR WITH MESH;  Surgeon: Krystal Russell, MD;  Location: [facility];  Service: General;  Laterality: N/A;   VENTRAL HERNIA REPAIR N/A 02/03/2021   Procedure: LAPAROSCOPIC VENTRAL HERNIA REPAIR WITH MESH;  Surgeon: Rubin Calamity, MD;  Location: [facility] OR;  Service: General;  Laterality: N/A;   XI ROBOTIC  ASSISTED COLOSTOMY TAKEDOWN N/A 03/11/2020   Procedure: ROBOTIC ASSISTED COLOSTOMY REVERSAL, RIGID PROCTOSCOPY;  Surgeon: Ned Hila, MD;  Location: [facility];  Service: General;  Laterality: N/A;    SOCIAL HISTORY: Social History   Socioeconomic History   Marital status: Married    Spouse name: Sho Salguero    Number of children: 2   Years of education: 12   Highest education level: Bachelor's degree (e.g., BA, AB, BS)  Occupational History   Occupation: Retired   Tobacco Use   Smoking status: Never   Smokeless tobacco: Never  Vaping Use   Vaping status: Never Used  Substance and Sexual Activity   Alcohol use: Yes    Alcohol/week: 17.0 standard drinks of alcohol    Types: 3 Glasses of wine, 14 Standard drinks or equivalent per week   Drug use: No   Sexual activity: Not Currently  Other Topics Concern   Not on file  Social History Narrative   Married. 2 children 52 in 48 in 2022- both went to [facility]. 5 grandkids- all girls from oldest 65 looking premed [employer], 67 soph [place], 2 26 year olds, 10 year olds.  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 [employer]: fishing, [employer] football and basketball, [place] [employer], yardwork   Social Drivers of Health   Financial Resource Strain: Low Risk  (01/10/2024)   Overall Financial Resource Strain (CARDIA)    Difficulty of Paying Living Expenses: Not hard at all  Food Insecurity: No Food Insecurity (01/10/2024)   Hunger Vital Sign    Worried About Programme researcher, broadcasting/film/video in the Last Year: Never true    Ran Out of Food in the Last Year: Never true  Transportation Needs: No Transportation Needs (01/10/2024)   PRAPARE - Administrator, Civil Service (Medical): No    Lack of Transportation (Non-Medical): No  Physical Activity: Inactive (01/10/2024)   Exercise Vital Sign    Days of Exercise per Week: 0 days     Minutes of Exercise per Session: 0 min  Stress: No Stress Concern Present (01/10/2024)   [employer] of Occupational Health - Occupational Stress Questionnaire    Feeling of Stress : Only a little  Social Connections: Socially Integrated (01/10/2024)   Social Connection and Isolation Panel    Frequency of Communication with Friends and Family: More than three times a week    Frequency of Social Gatherings with Friends and Family: Twice a week    Attends Religious Services: More than 4 times per year    Active Member of [employer] or Organizations: Yes    Attends Engineer, structural: More than 4 times per year    Marital Status: Married  Catering manager Violence: Not At Risk (01/10/2024)   Humiliation, Afraid, Rape, and Kick questionnaire    Fear of Current or Ex-Partner: No    Emotionally Abused: No    Physically Abused: No    Sexually Abused: No    FAMILY HISTORY: Family History  Problem Relation Age of Onset   Diabetes Mother    Heart failure Mother        around 10   CAD Father        12   Melanoma Brother 58   Healthy Daughter    Healthy Son    Colon cancer Neg Hx    Esophageal cancer Neg Hx    Rectal cancer Neg Hx    Stomach cancer Neg Hx     ALLERGIES:  is allergic to demerol, promethazine  hcl, and ativan  [lorazepam ].  MEDICATIONS:  Current Outpatient Medications  Medication Sig Dispense Refill   acetaminophen  (TYLENOL ) 500 MG tablet Take 500 mg by mouth every 6 (six) hours as needed.     aspirin  EC 81 MG tablet Take 81 mg by mouth daily. Swallow whole.     atorvastatin  (LIPITOR) 80 MG tablet TAKE 1 TABLET EVERY DAY (NEED MD APPOINTMENT FOR REFILLS) 7 tablet 0   Cholecalciferol (VITAMIN D3) 25 MCG (1000 UT) CAPS Take 1,000 Units by mouth daily.     fexofenadine (ALLEGRA) 180 MG tablet      finasteride  (PROSCAR ) 5 MG tablet Take 5 mg by mouth daily.     fluticasone  (FLONASE ) 50 MCG/ACT nasal spray Place into the nose.     Ibuprofen  200 MG CAPS       lenalidomide  (REVLIMID ) 10 MG capsule Take 1 capsule (10 mg total) by mouth daily. Take 1 capsule daily for 21 days and then none for 7 days. 21 capsule 0  lisinopril  (ZESTRIL ) 10 MG tablet TAKE 1 TABLET EVERY DAY 90 tablet 3   Magnesium  Oxide 400 MG CAPS      melatonin 5 MG TABS      Omega-3 Fatty Acids (FISH OIL) 1200 MG CAPS Take 1,200 mg by mouth daily.     vitamin B-12 (CYANOCOBALAMIN ) 1000 MCG tablet Take 1,000 mcg by mouth daily.     No current facility-administered medications for this visit.    REVIEW OF SYSTEMS:   Constitutional: ( - ) fevers, ( - )  chills , ( - ) night sweats Eyes: ( - ) blurriness of vision, ( - ) double vision, ( - ) watery eyes Ears, nose, mouth, throat, and face: ( - ) mucositis, ( - ) sore throat Respiratory: ( - ) cough, ( - ) dyspnea, ( - ) wheezes Cardiovascular: ( - ) palpitation, ( - ) chest discomfort, ( - ) lower extremity swelling Gastrointestinal:  ( - ) nausea, ( - ) heartburn, ( - ) change in bowel habits Skin: ( - ) abnormal skin rashes Lymphatics: ( - ) new lymphadenopathy, ( - ) easy bruising Neurological: ( + ) numbness, ( - ) tingling, ( - ) new weaknesses Behavioral/Psych: ( - ) mood change, ( - ) new changes  All other systems were reviewed with the patient and are negative.  PHYSICAL EXAMINATION: ECOG PERFORMANCE STATUS: 0 - Asymptomatic  There were no vitals filed for this visit.      There were no vitals filed for this visit.    GENERAL: Well-appearing elderly Caucasian male, alert, no distress and comfortable SKIN: skin color, texture, turgor are normal, no rashes or significant lesions EYES: conjunctiva are pink and non-injected, sclera clear LUNGS: clear to auscultation and percussion with normal breathing effort HEART: regular rate & rhythm and no murmurs and no lower extremity edema Musculoskeletal: no cyanosis of digits and no clubbing  PSYCH: alert & oriented x 3, fluent speech NEURO: no focal motor/sensory  deficits  LABORATORY DATA:  I have reviewed the data as listed    Latest Ref Rng & Units 01/16/2024    1:10 PM 12/19/2023    1:21 PM 11/21/2023    1:49 PM  CBC  WBC 4.0 - 10.5 K/uL 5.4  4.6  5.4   Hemoglobin 13.0 - 17.0 g/dL 85.0  85.1  85.4   Hematocrit 39.0 - 52.0 % 42.4  42.2  42.1   Platelets 150 - 400 K/uL 197  162  160        Latest Ref Rng & Units 01/16/2024    1:10 PM 12/19/2023    1:21 PM 11/21/2023    1:49 PM  CMP  Glucose 70 - 99 mg/dL 92  896  887   BUN 8 - 23 mg/dL 15  17  18    Creatinine 0.61 - 1.24 mg/dL 8.85  8.89  9.03   Sodium 135 - 145 mmol/L 139  139  137   Potassium 3.5 - 5.1 mmol/L 4.2  4.2  4.2   Chloride 98 - 111 mmol/L 107  104  106   CO2 22 - 32 mmol/L 26  25  27    Calcium  8.9 - 10.3 mg/dL 9.0  9.2  9.2   Total Protein 6.5 - 8.1 g/dL 6.9  6.5  6.7   Total Bilirubin 0.0 - 1.2 mg/dL 1.1  0.9  1.0   Alkaline Phos 38 - 126 U/L 75  63  70   AST 15 - 41  U/L 21  24  22    ALT 0 - 44 U/L 23  29  27      Lab Results  Component Value Date   MPROTEIN Not Observed 01/16/2024   MPROTEIN Not Observed 12/19/2023   MPROTEIN Not Observed 11/21/2023   Lab Results  Component Value Date   KPAFRELGTCHN 18.0 01/16/2024   KPAFRELGTCHN 16.5 12/19/2023   KPAFRELGTCHN 18.4 11/21/2023   LAMBDASER 13.5 01/16/2024   LAMBDASER 11.1 12/19/2023   LAMBDASER 13.8 11/21/2023   KAPLAMBRATIO 1.33 01/16/2024   KAPLAMBRATIO 1.49 12/19/2023   KAPLAMBRATIO 1.33 11/21/2023    RADIOGRAPHIC STUDIES: No results found.  ASSESSMENT & PLAN SHIQUAN MATHIEU is a 83 y.o. male with medical history significant for IgG kappa multiple myeloma who presents for a follow up visit.   # IgG Kappa Multiple Myeloma --Patient meets diagnostic criteria for multiple myeloma based on the presence of 60% plasma cells in the bone marrow --Pretreatment phenotype RBC as well as type and screen ordered  --Cycle 1 Day 1of Dara/Rev/Dex started on 09/30/2021. He started without revlimid  which was added on  10/06/2021 (Cycle 1 Day 8)  --Transitioned to maintenance Revlimid  on 08/17/2022.  Plan:  --Currently on maintenance revlimid  10 mg PO daily 21 of 28 days.  --Labs today showed WBC 4.3, Hgb 15.3, MCV 104.5, PLT 165. Creatinine and LFTs normal. Myeloma labs pending today --Most recent myeloma labs from 10/24/2023 did not detect M protein with normal sFLC. -- Return to clinic in 4 weeks for labs/Xgeva  and in 3 months for a repeat clinic visit   #Squamous Cell Carcinoma of the Skin -- Status post resection, radiation therapy recommended per dermatology. -- Patient has upcoming visit with Dr. Izell next week. -- Will discuss duration of therapy and Revlimid  with radiation oncology to see if we need to hold our therapy. -- Patient currently struggling with pain control, will call in tramadol  50 mg p.o. every 6 hours as needed to help with his pain.  #Lethargy 2/2 Zometa :  --Discussed alternative which would be Xgeva  q 28 days.  -- continue monthly Xgeva .    # Boils on Abdomen-stable -- Patient underwent I&D and subsequent antibiotic treatment with no resolution.  2 lesions have developed at the site of his prior ostomy.  A new lesion was present on 06/19/2022. --Patient was seen by surgery who ordered CT scan on 01/31/2022 that showed no fluid collection in the anterior abdominal wall. Instructed patient on how to pack wounds.  --No systemic symptoms, okay to continue with systemic chemotherapy. --continue to follow with surgery.   # Right hand neuropathy:  --Suspect carpal tunnel so recommend wearing a wrist brace.  --follows with Vaslow, next visit Sept 2025  #Supportive Care -- chemotherapy education complete  -- port placement not required.  --ASA 81 mg p.o. daily for thromboprophylaxis on Revlimid . -- zofran  8mg  q8H PRN and compazine  10mg  PO q6H for nausea -- zometa  clearance obtained, started on 03/03/2022. Had lethargy with zometa , will be converting to Xgeva .  Continue monthly  injections or Xgeva  x 2 years (July 2025).  -- no pain medication required at this time.   No orders of the defined types were placed in this encounter.  All questions were answered. The patient knows to call the clinic with any problems, questions or concerns.  I have spent a total of 30 minutes minutes of face-to-face and non-face-to-face time, preparing to see the patient, performing a medically appropriate examination, counseling and educating the patient, ordering medications/tests, documenting clinical  information in the electronic health record,  and care coordination.   Norleen IVAR Kidney, MD Department of Hematology/Oncology [facility] at [facility] Phone: [phone] Pager: [phone] Email: [email or web address]  02/13/2024 7:35 AM

## 2024-02-14 LAB — KAPPA/LAMBDA LIGHT CHAINS
Kappa free light chain: 23.1 mg/L — ABNORMAL HIGH (ref 3.3–19.4)
Kappa, lambda light chain ratio: 1.43 (ref 0.26–1.65)
Lambda free light chains: 16.1 mg/L (ref 5.7–26.3)

## 2024-02-17 ENCOUNTER — Encounter: Payer: Self-pay | Admitting: Hematology and Oncology

## 2024-02-17 LAB — MULTIPLE MYELOMA PANEL, SERUM
Albumin SerPl Elph-Mcnc: 3.5 g/dL (ref 2.9–4.4)
Albumin/Glob SerPl: 1.3 (ref 0.7–1.7)
Alpha 1: 0.2 g/dL (ref 0.0–0.4)
Alpha2 Glob SerPl Elph-Mcnc: 0.7 g/dL (ref 0.4–1.0)
B-Globulin SerPl Elph-Mcnc: 1.1 g/dL (ref 0.7–1.3)
Gamma Glob SerPl Elph-Mcnc: 0.9 g/dL (ref 0.4–1.8)
Globulin, Total: 2.9 g/dL (ref 2.2–3.9)
IgA: 261 mg/dL (ref 61–437)
IgG (Immunoglobin G), Serum: 890 mg/dL (ref 603–1613)
IgM (Immunoglobulin M), Srm: 51 mg/dL (ref 15–143)
Total Protein ELP: 6.4 g/dL (ref 6.0–8.5)

## 2024-02-19 ENCOUNTER — Encounter: Payer: Self-pay | Admitting: Radiology

## 2024-02-19 ENCOUNTER — Ambulatory Visit
Admission: RE | Admit: 2024-02-19 | Discharge: 2024-02-19 | Disposition: A | Discharge status: Home / Self Care | Source: Ambulatory Visit | Attending: Radiology | Admitting: Radiology

## 2024-02-19 ENCOUNTER — Other Ambulatory Visit: Payer: Self-pay | Admitting: Hematology and Oncology

## 2024-02-19 VITALS — BP 124/73 | HR 59 | Temp 97.6°F | Resp 20 | Ht 69.0 in | Wt 196.8 lb

## 2024-02-19 DIAGNOSIS — Z923 Personal history of irradiation: Secondary | ICD-10-CM | POA: Diagnosis not present

## 2024-02-19 DIAGNOSIS — Z7961 Long term (current) use of immunomodulator: Secondary | ICD-10-CM | POA: Insufficient documentation

## 2024-02-19 DIAGNOSIS — C9000 Multiple myeloma not having achieved remission: Secondary | ICD-10-CM

## 2024-02-19 DIAGNOSIS — Z7982 Long term (current) use of aspirin: Secondary | ICD-10-CM | POA: Diagnosis not present

## 2024-02-19 DIAGNOSIS — Z85828 Personal history of other malignant neoplasm of skin: Secondary | ICD-10-CM | POA: Diagnosis not present

## 2024-02-19 DIAGNOSIS — C44329 Squamous cell carcinoma of skin of other parts of face: Secondary | ICD-10-CM | POA: Insufficient documentation

## 2024-02-19 NOTE — Progress Notes (Signed)
   Wesley [name] is here for a routine follow-up appointment today with Leeroy Due PA-C.  Diagnosis: Squamous cell carcinoma of skin of other parts of face. Treatment Completion Date: 01/14/24 Pain issues, if any: Denies Using a feeding tube?: N/A Weight changes, if any: Denies Swallowing issues, if any: Denies Smoking or chewing tobacco? Denies Using fluoride toothpaste daily? Denies Last ENT visit was on:  Other notable issues, if any: Wesley [name] reports doing well overall since radiation treatment.He reports mild but getting his energy slowly. He has no questions concerns or complaints.   BP 124/73 (BP Location: Right Arm, Patient Position: Sitting, Cuff Size: Large)   Pulse (!) 59   Temp 97.6 F (36.4 C)   Resp 20   Ht 5' 9 (1.753 m)   Wt 196 lb 12.8 oz (89.3 kg)   SpO2 97%   BMI 29.06 kg/m

## 2024-02-19 NOTE — Progress Notes (Signed)
 Radiation Oncology         [phone] ________________________________  Name: Wesley [name] MRN: [id number]  Date: 02/19/2024  DOB: 05-30-1941  Follow-Up Visit Note  CC: [name], MD  [name], MD  Diagnosis and Prior Radiotherapy:       ICD-10-CM   1. 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  C44.329 CT Soft Tissue Neck W Contrast       Cancer Staging  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 Staging form: Cutaneous Carcinoma of the Head and Neck, AJCC 8th Edition - Pathologic stage from 11/27/2023: Stage III (pT3, pN0, cM0) - Signed by Izell Domino, MD on 11/28/2023 Stage prefix: Initial diagnosis Extraosseous extension: Absent  ==========DELIVERED PLANS==========  First Treatment Date: 2023-12-17 Last Treatment Date: 2024-01-14   Plan Name: [id number] Site: Face Technique: Electron Mode: Electron Dose Per Fraction: 2.5 Gy Prescribed Dose (Delivered / Prescribed): 50 Gy / 50 Gy Prescribed Fxs (Delivered / Prescribed): 20 / 20  Stage III (pT3, N0, 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 He completed his treatment on 01/14/2024  Treatment Completion Date: 01/14/24 Pain issues, if any: Denies Using a feeding tube?: N/A Weight changes, if any: Denies Swallowing issues, if any: Denies Smoking or chewing tobacco? Denies Using fluoride toothpaste daily? Denies  Other notable issues, if any: Mr. Fazzino reports doing well overall since radiation treatment.He reports mild but getting his energy slowly. He has no questions concerns or complaints.                     ALLERGIES:  is allergic to demerol, promethazine  hcl, and ativan  [lorazepam ].  Meds: Current Outpatient Medications  Medication Sig Dispense Refill    acetaminophen  (TYLENOL ) 500 MG tablet Take 500 mg by mouth every 6 (six) hours as needed.     aspirin  EC 81 MG tablet Take 81 mg by mouth daily. Swallow whole.     atorvastatin  (LIPITOR) 80 MG tablet TAKE 1 TABLET EVERY DAY (NEED MD APPOINTMENT FOR REFILLS) 7 tablet 0   Cholecalciferol (VITAMIN D3) 25 MCG (1000 UT) CAPS Take 1,000 Units by mouth daily.     fexofenadine (ALLEGRA) 180 MG tablet      finasteride  (PROSCAR ) 5 MG tablet Take 5 mg by mouth daily.     fluticasone  (FLONASE ) 50 MCG/ACT nasal spray Place into the nose.     Ibuprofen  200 MG CAPS      lenalidomide  (REVLIMID ) 10 MG capsule Take 1 capsule (10 mg total) by mouth daily. Take 1 capsule daily for 21 days and then none for 7 days. 21 capsule 0   lisinopril  (ZESTRIL ) 10 MG tablet TAKE 1 TABLET EVERY DAY 90 tablet 3   Magnesium  Oxide 400 MG CAPS      melatonin 5 MG TABS      Omega-3 Fatty Acids (FISH OIL) 1200 MG CAPS Take 1,200 mg by mouth daily.     vitamin B-12 (CYANOCOBALAMIN ) 1000 MCG tablet Take 1,000 mcg by mouth daily.     No current facility-administered medications for this encounter.    Physical Findings: The patient is in no acute distress. Patient is alert and oriented. Wt Readings from Last 3 Encounters:  02/19/24 196 lb 12.8 oz (89.3 kg)  02/13/24 198 lb 8  oz (90 kg)  01/10/24 198 lb (89.8 kg)    height is 5' 9 (1.753 m) and weight is 196 lb 12.8 oz (89.3 kg). His temperature is 97.6 F (36.4 C). His blood pressure is 124/73 and his pulse is 59 (abnormal). His respiration is 20 and oxygen saturation is 97%. .  General: Alert and oriented, in no acute distress HEENT: Head is normocephalic. Extraocular movements are intact. Oropharynx is clear with no concerning lesions.  Neck: Neck is notable for no palpable adenopathy Skin: Skin in treatment fields shows well healed surgical scars with surrounding erythema, consistent with radiation changes Heart: Regular in rate and rhythm with no murmurs, rubs, or  gallops. Chest: Clear to auscultation bilaterally, with no rhonchi, wheezes, or rales. Abdomen: Soft, nontender, nondistended, with no rigidity or guarding. Extremities: No cyanosis or edema. Lymphatics: see Neck Exam Psychiatric: Judgment and insight are intact. Affect is appropriate.   Lab Findings: Lab Results  Component Value Date   WBC 4.3 02/13/2024   HGB 15.3 02/13/2024   HCT 44.0 02/13/2024   MCV 104.5 (H) 02/13/2024   PLT 165 02/13/2024    Lab Results  Component Value Date   TSH 1.984 04/08/2021    Radiographic Findings: No results found.  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 We discussed ways to help with radiation fatigue via exercise, diet, and good sleep hygiene.   CT of the neck with contrast in 2 months with an office visit to follow. Patient was encouraged to call with any questions or concerns in the meantime.   On date of service, in total, I spent 20 minutes on this encounter. Patient was seen in person. _____________________________________    Leeroy Due, PA-C

## 2024-02-20 ENCOUNTER — Other Ambulatory Visit: Payer: Self-pay | Admitting: *Deleted

## 2024-02-20 DIAGNOSIS — C9000 Multiple myeloma not having achieved remission: Secondary | ICD-10-CM

## 2024-02-20 MED ORDER — LENALIDOMIDE 10 MG PO CAPS
10.0000 mg | ORAL_CAPSULE | Freq: Every day | ORAL | 0 refills | Status: DC
Start: 2024-02-20 — End: 2024-03-19

## 2024-02-25 ENCOUNTER — Telehealth: Payer: Self-pay | Admitting: *Deleted

## 2024-02-25 NOTE — Telephone Encounter (Signed)
 CALLED PATIENT TO INFORM OF CT FOR 04-16-24- ARRIVAL TIME- 12:45 PM @ [facility], NO RESTRICTIONS TO SCAN, PATIENT TO RECEIVE RESULTS FROM DR. SQUIRE ON 04-22-24 @ 3 PM, SPOKE WITH PATIENT AND HE IS AWARE OF THESE APPTS. AND THE INSTRUCTIONS

## 2024-02-26 ENCOUNTER — Telehealth: Payer: Self-pay | Admitting: Hematology and Oncology

## 2024-02-26 DIAGNOSIS — N403 Nodular prostate with lower urinary tract symptoms: Secondary | ICD-10-CM | POA: Diagnosis not present

## 2024-02-26 DIAGNOSIS — R351 Nocturia: Secondary | ICD-10-CM | POA: Diagnosis not present

## 2024-02-26 DIAGNOSIS — R3915 Urgency of urination: Secondary | ICD-10-CM | POA: Diagnosis not present

## 2024-02-26 NOTE — Telephone Encounter (Signed)
 Scheduled appointments per 7/2 los. Talked with the patient and he is aware of the made appointments.

## 2024-03-08 ENCOUNTER — Other Ambulatory Visit: Payer: Self-pay | Admitting: Cardiology

## 2024-03-12 ENCOUNTER — Inpatient Hospital Stay

## 2024-03-12 VITALS — BP 116/71 | HR 61 | Temp 98.2°F | Resp 18

## 2024-03-12 DIAGNOSIS — D649 Anemia, unspecified: Secondary | ICD-10-CM | POA: Diagnosis not present

## 2024-03-12 DIAGNOSIS — Z85828 Personal history of other malignant neoplasm of skin: Secondary | ICD-10-CM | POA: Diagnosis not present

## 2024-03-12 DIAGNOSIS — C9000 Multiple myeloma not having achieved remission: Secondary | ICD-10-CM

## 2024-03-12 DIAGNOSIS — C9001 Multiple myeloma in remission: Secondary | ICD-10-CM

## 2024-03-12 DIAGNOSIS — Z7961 Long term (current) use of immunomodulator: Secondary | ICD-10-CM | POA: Diagnosis not present

## 2024-03-12 DIAGNOSIS — Z7982 Long term (current) use of aspirin: Secondary | ICD-10-CM | POA: Diagnosis not present

## 2024-03-12 DIAGNOSIS — Z79899 Other long term (current) drug therapy: Secondary | ICD-10-CM | POA: Diagnosis not present

## 2024-03-12 LAB — CBC WITH DIFFERENTIAL (CANCER CENTER ONLY)
Abs Immature Granulocytes: 0.02 K/uL (ref 0.00–0.07)
Basophils Absolute: 0.1 K/uL (ref 0.0–0.1)
Basophils Relative: 2 %
Eosinophils Absolute: 0.3 K/uL (ref 0.0–0.5)
Eosinophils Relative: 8 %
HCT: 43.6 % (ref 39.0–52.0)
Hemoglobin: 15.2 g/dL (ref 13.0–17.0)
Immature Granulocytes: 1 %
Lymphocytes Relative: 24 %
Lymphs Abs: 1 K/uL (ref 0.7–4.0)
MCH: 35.8 pg — ABNORMAL HIGH (ref 26.0–34.0)
MCHC: 34.9 g/dL (ref 30.0–36.0)
MCV: 102.8 fL — ABNORMAL HIGH (ref 80.0–100.0)
Monocytes Absolute: 0.6 K/uL (ref 0.1–1.0)
Monocytes Relative: 15 %
Neutro Abs: 2.1 K/uL (ref 1.7–7.7)
Neutrophils Relative %: 50 %
Platelet Count: 147 K/uL — ABNORMAL LOW (ref 150–400)
RBC: 4.24 MIL/uL (ref 4.22–5.81)
RDW: 14.5 % (ref 11.5–15.5)
WBC Count: 4.2 K/uL (ref 4.0–10.5)
nRBC: 0 % (ref 0.0–0.2)

## 2024-03-12 LAB — CMP (CANCER CENTER ONLY)
ALT: 31 U/L (ref 0–44)
AST: 25 U/L (ref 15–41)
Albumin: 4.1 g/dL (ref 3.5–5.0)
Alkaline Phosphatase: 71 U/L (ref 38–126)
Anion gap: 7 (ref 5–15)
BUN: 16 mg/dL (ref 8–23)
CO2: 26 mmol/L (ref 22–32)
Calcium: 8.7 mg/dL — ABNORMAL LOW (ref 8.9–10.3)
Chloride: 109 mmol/L (ref 98–111)
Creatinine: 1.01 mg/dL (ref 0.61–1.24)
GFR, Estimated: >60 mL/min (ref >60)
Glucose, Bld: 116 mg/dL — ABNORMAL HIGH (ref 70–99)
Potassium: 4.1 mmol/L (ref 3.5–5.1)
Sodium: 142 mmol/L (ref 135–145)
Total Bilirubin: 1.4 mg/dL — ABNORMAL HIGH (ref 0.0–1.2)
Total Protein: 6.8 g/dL (ref 6.5–8.1)

## 2024-03-12 LAB — LACTATE DEHYDROGENASE: LDH: 132 U/L (ref 98–192)

## 2024-03-12 MED ORDER — DENOSUMAB 120 MG/1.7ML ~~LOC~~ SOLN
120.0000 mg | Freq: Once | SUBCUTANEOUS | Status: AC
Start: 2024-03-12 — End: 2024-03-12
  Administered 2024-03-12: 120 mg via SUBCUTANEOUS
  Filled 2024-03-12: qty 1.7

## 2024-03-13 ENCOUNTER — Other Ambulatory Visit (HOSPITAL_BASED_OUTPATIENT_CLINIC_OR_DEPARTMENT_OTHER): Payer: Self-pay

## 2024-03-13 ENCOUNTER — Telehealth: Payer: Self-pay | Admitting: Cardiology

## 2024-03-13 LAB — KAPPA/LAMBDA LIGHT CHAINS
Kappa free light chain: 19.2 mg/L (ref 3.3–19.4)
Kappa, lambda light chain ratio: 1.51 (ref 0.26–1.65)
Lambda free light chains: 12.7 mg/L (ref 5.7–26.3)

## 2024-03-13 MED ORDER — ATORVASTATIN CALCIUM 80 MG PO TABS
80.0000 mg | ORAL_TABLET | Freq: Every day | ORAL | 0 refills | Status: DC
  Filled 2024-03-13: qty 90, 90d supply, fill #0

## 2024-03-13 NOTE — Telephone Encounter (Signed)
 *  STAT* If patient is at the pharmacy, call can be transferred to refill team.   1. Which medications need to be refilled? (please list name of each medication and dose if known)   atorvastatin  (LIPITOR) 80 MG tablet   2. Which pharmacy/location (including street and city if local pharmacy) is medication to be sent to?  [facility] - [place], [place] - [address]    3. Do they need a 30 day or 90 day supply?  90  Patient has appointment with Dr Jeffrie on 06/13/24

## 2024-03-13 NOTE — Progress Notes (Signed)
 Per Dr. [name] ok to proceed with Xgeva  with Ca at 8.7.   Alfonso MARLA Buys, PharmD Pharmacy Resident  03/13/2024 8:04 AM

## 2024-03-14 LAB — MULTIPLE MYELOMA PANEL, SERUM
Albumin SerPl Elph-Mcnc: 3.6 g/dL (ref 2.9–4.4)
Albumin/Glob SerPl: 1.3 (ref 0.7–1.7)
Alpha 1: 0.2 g/dL (ref 0.0–0.4)
Alpha2 Glob SerPl Elph-Mcnc: 0.7 g/dL (ref 0.4–1.0)
B-Globulin SerPl Elph-Mcnc: 1 g/dL (ref 0.7–1.3)
Gamma Glob SerPl Elph-Mcnc: 0.8 g/dL (ref 0.4–1.8)
Globulin, Total: 2.8 g/dL (ref 2.2–3.9)
IgA: 253 mg/dL (ref 61–437)
IgG (Immunoglobin G), Serum: 844 mg/dL (ref 603–1613)
IgM (Immunoglobulin M), Srm: 48 mg/dL (ref 15–143)
Total Protein ELP: 6.4 g/dL (ref 6.0–8.5)

## 2024-03-18 ENCOUNTER — Other Ambulatory Visit: Payer: Self-pay | Admitting: Hematology and Oncology

## 2024-03-18 DIAGNOSIS — C9000 Multiple myeloma not having achieved remission: Secondary | ICD-10-CM

## 2024-03-19 ENCOUNTER — Other Ambulatory Visit: Payer: Self-pay | Admitting: *Deleted

## 2024-03-19 DIAGNOSIS — C9000 Multiple myeloma not having achieved remission: Secondary | ICD-10-CM

## 2024-03-19 MED ORDER — LENALIDOMIDE 10 MG PO CAPS: 10.0000 mg | ORAL_CAPSULE | Freq: Every day | ORAL | 0 refills | Status: DC

## 2024-03-24 ENCOUNTER — Other Ambulatory Visit (HOSPITAL_BASED_OUTPATIENT_CLINIC_OR_DEPARTMENT_OTHER): Payer: Self-pay

## 2024-04-07 ENCOUNTER — Ambulatory Visit (INDEPENDENT_AMBULATORY_CARE_PROVIDER_SITE_OTHER): Admitting: Podiatry

## 2024-04-07 ENCOUNTER — Encounter: Payer: Self-pay | Admitting: Podiatry

## 2024-04-07 DIAGNOSIS — T451X5A Adverse effect of antineoplastic and immunosuppressive drugs, initial encounter: Secondary | ICD-10-CM

## 2024-04-07 DIAGNOSIS — B351 Tinea unguium: Secondary | ICD-10-CM | POA: Diagnosis not present

## 2024-04-07 DIAGNOSIS — M79675 Pain in left toe(s): Secondary | ICD-10-CM | POA: Diagnosis not present

## 2024-04-07 DIAGNOSIS — M79674 Pain in right toe(s): Secondary | ICD-10-CM | POA: Diagnosis not present

## 2024-04-07 DIAGNOSIS — G620 Drug-induced polyneuropathy: Secondary | ICD-10-CM

## 2024-04-07 NOTE — Progress Notes (Signed)
This patient presents to the office with chief complaint of long thick painful nails.  Patient says the nails are painful walking and wearing shoes.  This patient is unable to self treat.  This patient is unable to trim his nails since he is unable to reach his nails.  he presents to the office for preventative foot care services.  General Appearance  Alert, conversant and in no acute stress.  Vascular  Dorsalis pedis and posterior tibial  pulses are palpable  bilaterally.  Capillary return is within normal limits  bilaterally. Temperature is within normal limits  bilaterally.  Neurologic  Senn-Weinstein monofilament wire test within normal limits  bilaterally. Muscle power within normal limits bilaterally.  Nails Thick disfigured discolored nails with subungual debris  from hallux to fifth toes bilaterally. No evidence of bacterial infection or drainage bilaterally.  Orthopedic  No limitations of motion  feet .  No crepitus or effusions noted.  No bony pathology or digital deformities noted.  Hammer toes  B/L 2-5  Skin  normotropic skin with no porokeratosis noted bilaterally.  No signs of infections or ulcers noted.   Asymptomatic clavi  B/L.  Onychomycosis  Nails  B/L.  Pain in right toes  Pain in left toes  Debridement of nails both feet followed trimming the nails with dremel tool.    RTC 3 months.   Helane Gunther DPM

## 2024-04-09 ENCOUNTER — Inpatient Hospital Stay: Attending: Hematology and Oncology

## 2024-04-09 ENCOUNTER — Inpatient Hospital Stay

## 2024-04-09 VITALS — BP 105/71 | HR 54 | Resp 16

## 2024-04-09 DIAGNOSIS — C9000 Multiple myeloma not having achieved remission: Secondary | ICD-10-CM | POA: Diagnosis not present

## 2024-04-09 DIAGNOSIS — C9001 Multiple myeloma in remission: Secondary | ICD-10-CM

## 2024-04-09 LAB — CBC WITH DIFFERENTIAL (CANCER CENTER ONLY)
Abs Immature Granulocytes: 0.03 K/uL (ref 0.00–0.07)
Basophils Absolute: 0.1 K/uL (ref 0.0–0.1)
Basophils Relative: 1 %
Eosinophils Absolute: 0.4 K/uL (ref 0.0–0.5)
Eosinophils Relative: 7 %
HCT: 42.6 % (ref 39.0–52.0)
Hemoglobin: 14.8 g/dL (ref 13.0–17.0)
Immature Granulocytes: 1 %
Lymphocytes Relative: 19 %
Lymphs Abs: 0.9 K/uL (ref 0.7–4.0)
MCH: 35.6 pg — ABNORMAL HIGH (ref 26.0–34.0)
MCHC: 34.7 g/dL (ref 30.0–36.0)
MCV: 102.4 fL — ABNORMAL HIGH (ref 80.0–100.0)
Monocytes Absolute: 0.8 K/uL (ref 0.1–1.0)
Monocytes Relative: 16 %
Neutro Abs: 2.8 K/uL (ref 1.7–7.7)
Neutrophils Relative %: 56 %
Platelet Count: 141 K/uL — ABNORMAL LOW (ref 150–400)
RBC: 4.16 MIL/uL — ABNORMAL LOW (ref 4.22–5.81)
RDW: 14.1 % (ref 11.5–15.5)
WBC Count: 5 K/uL (ref 4.0–10.5)
nRBC: 0 % (ref 0.0–0.2)

## 2024-04-09 LAB — CMP (CANCER CENTER ONLY)
ALT: 34 U/L (ref 0–44)
AST: 24 U/L (ref 15–41)
Albumin: 3.7 g/dL (ref 3.5–5.0)
Alkaline Phosphatase: 63 U/L (ref 38–126)
Anion gap: 4 — ABNORMAL LOW (ref 5–15)
BUN: 16 mg/dL (ref 8–23)
CO2: 28 mmol/L (ref 22–32)
Calcium: 8.9 mg/dL (ref 8.9–10.3)
Chloride: 105 mmol/L (ref 98–111)
Creatinine: 1.04 mg/dL (ref 0.61–1.24)
GFR, Estimated: >60 mL/min (ref >60)
Glucose, Bld: 111 mg/dL — ABNORMAL HIGH (ref 70–99)
Potassium: 4.1 mmol/L (ref 3.5–5.1)
Sodium: 137 mmol/L (ref 135–145)
Total Bilirubin: 1 mg/dL (ref 0.0–1.2)
Total Protein: 6 g/dL — ABNORMAL LOW (ref 6.5–8.1)

## 2024-04-09 LAB — LACTATE DEHYDROGENASE: LDH: 128 U/L (ref 98–192)

## 2024-04-09 MED ORDER — DENOSUMAB 120 MG/1.7ML ~~LOC~~ SOLN
120.0000 mg | Freq: Once | SUBCUTANEOUS | Status: AC
  Administered 2024-04-09: 120 mg via SUBCUTANEOUS
  Filled 2024-04-09: qty 1.7

## 2024-04-10 LAB — KAPPA/LAMBDA LIGHT CHAINS
Kappa free light chain: 20.2 mg/L — ABNORMAL HIGH (ref 3.3–19.4)
Kappa, lambda light chain ratio: 1.34 (ref 0.26–1.65)
Lambda free light chains: 15.1 mg/L (ref 5.7–26.3)

## 2024-04-14 ENCOUNTER — Other Ambulatory Visit: Payer: Self-pay | Admitting: Hematology and Oncology

## 2024-04-14 DIAGNOSIS — C9000 Multiple myeloma not having achieved remission: Secondary | ICD-10-CM

## 2024-04-14 LAB — MULTIPLE MYELOMA PANEL, SERUM
Albumin SerPl Elph-Mcnc: 3.4 g/dL (ref 2.9–4.4)
Albumin/Glob SerPl: 1.5 (ref 0.7–1.7)
Alpha 1: 0.2 g/dL (ref 0.0–0.4)
Alpha2 Glob SerPl Elph-Mcnc: 0.6 g/dL (ref 0.4–1.0)
B-Globulin SerPl Elph-Mcnc: 0.9 g/dL (ref 0.7–1.3)
Gamma Glob SerPl Elph-Mcnc: 0.7 g/dL (ref 0.4–1.8)
Globulin, Total: 2.4 g/dL (ref 2.2–3.9)
IgA: 235 mg/dL (ref 61–437)
IgG (Immunoglobin G), Serum: 776 mg/dL (ref 603–1613)
IgM (Immunoglobulin M), Srm: 47 mg/dL (ref 15–143)
Total Protein ELP: 5.8 g/dL — ABNORMAL LOW (ref 6.0–8.5)

## 2024-04-15 ENCOUNTER — Other Ambulatory Visit: Payer: Self-pay | Admitting: *Deleted

## 2024-04-15 DIAGNOSIS — C9000 Multiple myeloma not having achieved remission: Secondary | ICD-10-CM

## 2024-04-15 MED ORDER — LENALIDOMIDE 10 MG PO CAPS
10.0000 mg | ORAL_CAPSULE | Freq: Every day | ORAL | 0 refills | Status: DC
Start: 2024-04-15 — End: 2024-05-15

## 2024-04-16 ENCOUNTER — Encounter (HOSPITAL_COMMUNITY): Payer: Self-pay

## 2024-04-16 ENCOUNTER — Ambulatory Visit (HOSPITAL_COMMUNITY)
Admission: RE | Admit: 2024-04-16 | Discharge: 2024-04-16 | Disposition: A | Discharge status: Home / Self Care | Source: Ambulatory Visit | Attending: Radiology | Admitting: Radiology

## 2024-04-16 DIAGNOSIS — C44329 Squamous cell carcinoma of skin of other parts of face: Secondary | ICD-10-CM | POA: Insufficient documentation

## 2024-04-16 MED ORDER — IOHEXOL 300 MG/ML  SOLN
75.0000 mL | Freq: Once | INTRAMUSCULAR | Status: AC | PRN
Start: 2024-04-16 — End: 2024-04-16
  Administered 2024-04-16: 75 mL via INTRAVENOUS

## 2024-04-17 NOTE — Progress Notes (Signed)
 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  Pain issues, if any: Patient denies any pain, skin irritation or redness. Using a feeding tube?: None Weight changes, if any: Patient's weight has stayed the same Swallowing issues, if any: None Smoking or chewing tobacco? None Using fluoride toothpaste daily? Yes Last ENT visit was on: N/A Other notable issues, if any:  Patient does experience fatigue during the day, he does take naps during the day.   BP 135/75 (BP Location: Right Arm, Patient Position: Sitting, Cuff Size: Large)   Pulse 64   Temp 97.8 F (36.6 C)   Resp 20   Ht 5' 9 (1.753 m)   Wt 197 lb 9.6 oz (89.6 kg)   SpO2 98%   BMI 29.18 kg/m     Wt Readings from Last 3 Encounters:  04/22/24 197 lb 9.6 oz (89.6 kg)  02/19/24 196 lb 12.8 oz (89.3 kg)  02/13/24 198 lb 8 oz (90 kg)

## 2024-04-22 ENCOUNTER — Ambulatory Visit
Admission: RE | Admit: 2024-04-22 | Discharge: 2024-04-22 | Disposition: A | Discharge status: Home / Self Care | Payer: Self-pay | Source: Ambulatory Visit | Attending: Radiation Oncology | Admitting: Radiation Oncology

## 2024-04-22 VITALS — BP 135/75 | HR 64 | Temp 97.8°F | Resp 20 | Ht 69.0 in | Wt 197.6 lb

## 2024-04-22 DIAGNOSIS — D333 Benign neoplasm of cranial nerves: Secondary | ICD-10-CM | POA: Diagnosis not present

## 2024-04-22 DIAGNOSIS — C44329 Squamous cell carcinoma of skin of other parts of face: Secondary | ICD-10-CM | POA: Insufficient documentation

## 2024-04-22 NOTE — Progress Notes (Signed)
 Radiation Oncology         [phone] ________________________________  Name: Wesley [name] MRN: [id number]  Date: 04/22/2024  DOB: April 21, 1941  Follow-Up Visit Note  CC: [name], MD  [name], MD  Diagnosis and Prior Radiotherapy:     No diagnosis found. ***    Cancer Staging  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 Staging form: Cutaneous Carcinoma of the Head and Neck, AJCC 8th Edition - Pathologic stage from 11/27/2023: Stage III (pT3, pN0, cM0) - Signed by Izell Domino, MD on 11/28/2023 Stage prefix: Initial diagnosis Extraosseous extension: Absent  ==========DELIVERED PLANS==========  First Treatment Date: 2023-12-17 Last Treatment Date: 2024-01-14   Plan Name: [id number] Site: Face Technique: Electron Mode: Electron Dose Per Fraction: 2.5 Gy Prescribed Dose (Delivered / Prescribed): 50 Gy / 50 Gy Prescribed Fxs (Delivered / Prescribed): 20 / 20  Stage III (pT3, N0, 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 He completed his treatment on 01/14/2024  CT of the neck from 04/16/2024 demonstrates an ill-defined subcutaneous density in the left cheek, similar to prior study. No enlarging mass or adenopathy appreciated.   ***                    ALLERGIES:  is allergic to demerol, promethazine  hcl, and ativan  [lorazepam ].  Meds: Current Outpatient Medications  Medication Sig Dispense Refill   acetaminophen  (TYLENOL ) 500 MG tablet Take 500 mg by mouth every 6 (six) hours as needed.     aspirin  EC 81 MG tablet Take 81 mg by mouth daily. Swallow whole.     atorvastatin  (LIPITOR) 80 MG tablet Take 1 tablet (80 mg total) by mouth daily. 90 tablet 0   Cholecalciferol  (VITAMIN D3) 25 MCG (1000 UT) CAPS Take 1,000 Units by mouth daily.     fexofenadine (ALLEGRA) 180 MG tablet      finasteride  (PROSCAR ) 5 MG tablet Take 5 mg by mouth daily.     fluticasone  (FLONASE ) 50 MCG/ACT nasal spray Place into the nose.     Ibuprofen  200 MG CAPS      lenalidomide  (REVLIMID ) 10 MG capsule Take 1 capsule (10 mg total) by mouth daily. Take 1 capsule daily for 21 days and then none for 7 days. 21 capsule 0   lisinopril  (ZESTRIL ) 10 MG tablet TAKE 1 TABLET EVERY DAY 90 tablet 3   Magnesium  Oxide 400 MG CAPS      melatonin 5 MG TABS      Omega-3 Fatty Acids (FISH OIL) 1200 MG CAPS Take 1,200 mg by mouth daily.     vitamin B-12 (CYANOCOBALAMIN ) 1000 MCG tablet Take 1,000 mcg by mouth daily.     No current facility-administered medications for this encounter.    Physical Findings: *** The patient is in no acute distress. Patient is alert and oriented. Wt Readings from Last 3 Encounters:  04/22/24 197 lb 9.6 oz (89.6 kg)  02/19/24 196 lb 12.8 oz (89.3 kg)  02/13/24 198 lb 8 oz (90 kg)    height is 5' 9 (1.753 m) and weight is 197 lb 9.6 oz (89.6 kg). His temperature is 97.8 F (36.6 C). His blood pressure is 135/75 and his pulse is 64. His  respiration is 20 and oxygen saturation is 98%. .  General: Alert and oriented, in no acute distress HEENT: Head is normocephalic. Extraocular movements are intact. Oropharynx is clear with no concerning lesions.  Neck: Neck is notable for no palpable adenopathy Skin: Skin in treatment fields shows well healed surgical scars with surrounding erythema, consistent with radiation changes Heart: Regular in rate and rhythm with no murmurs, rubs, or gallops. Chest: Clear to auscultation bilaterally, with no rhonchi, wheezes, or rales. Abdomen: Soft, nontender, nondistended, with no rigidity or guarding. Extremities: No cyanosis or edema. Lymphatics: see Neck Exam Psychiatric: Judgment and insight are intact. Affect is  appropriate.   Lab Findings: Lab Results  Component Value Date   WBC 5.0 04/09/2024   HGB 14.8 04/09/2024   HCT 42.6 04/09/2024   MCV 102.4 (H) 04/09/2024   PLT 141 (L) 04/09/2024    Lab Results  Component Value Date   TSH 1.984 04/08/2021    Radiographic Findings: CT Soft Tissue Neck W Contrast Result Date: 04/18/2024 CLINICAL DATA:  Head and neck carcinoma, squamous cell carcinoma left cheek EXAM: CT NECK WITH CONTRAST TECHNIQUE: Multidetector CT imaging of the neck was performed using the standard protocol following the bolus administration of intravenous contrast. RADIATION DOSE REDUCTION: This exam was performed according to the departmental dose-optimization program which includes automated exposure control, adjustment of the mA and/or kV according to patient size and/or use of iterative reconstruction technique. CONTRAST:  75mL OMNIPAQUE  IOHEXOL  300 MG/ML  SOLN COMPARISON:  December 04, 2023 FINDINGS: There is some ill-defined subcutaneous density in the left cheek. This is similar to the previous study. Pharynx: The nasopharynx, oropharynx and hypopharynx are normal Oral cavity/floor of mouth: Normal Larynx: Normal Salivary glands: The parotid and submandibular glands are normal Thyroid : Normal Lymph nodes: No adenopathy Vascular: No significant abnormality Limited intracranial: No significant abnormality Visualized orbits: No significant abnormality Mastoids and visualized paranasal sinuses: No significant abnormality Skeleton: Cervical spondylosis Upper chest: No significant abnormality Other: None IMPRESSION: There is ill-defined subcutaneous density in the left cheek similar to the prior study. No enlarging mass.  No adenopathy. Electronically Signed   By: Nancyann Burns M.D.   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 We personally reviewed the results of his most recent CT scan which demonstrate no evidence of disease recurrence or progression.   He will continue on maintenance revlimid  under the care of Dr. [name]. He is scheduled to see medical oncology next on 05/07/2024.   He will see Dr. 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   Patient will continue to follow with his dermatologist regularly. Radiation follow-up PRN. We appreciate the opportunity to take part in this patient's care.    On date of service, in total, I spent 20 minutes on this encounter. Patient was seen in person. _____________________________________    Leeroy Due, PA-C

## 2024-05-02 ENCOUNTER — Encounter: Payer: Self-pay | Admitting: Hematology and Oncology

## 2024-05-06 ENCOUNTER — Other Ambulatory Visit: Payer: Self-pay | Admitting: Physician Assistant

## 2024-05-06 DIAGNOSIS — C9001 Multiple myeloma in remission: Secondary | ICD-10-CM

## 2024-05-07 ENCOUNTER — Inpatient Hospital Stay

## 2024-05-07 ENCOUNTER — Inpatient Hospital Stay: Attending: Hematology and Oncology | Admitting: Physician Assistant

## 2024-05-07 ENCOUNTER — Inpatient Hospital Stay: Attending: Hematology and Oncology

## 2024-05-07 VITALS — BP 126/78 | HR 62 | Temp 97.7°F | Resp 13 | Wt 197.8 lb

## 2024-05-07 VITALS — BP 116/81 | HR 59 | Temp 97.6°F | Resp 17

## 2024-05-07 DIAGNOSIS — C9001 Multiple myeloma in remission: Secondary | ICD-10-CM

## 2024-05-07 DIAGNOSIS — C9000 Multiple myeloma not having achieved remission: Secondary | ICD-10-CM | POA: Diagnosis not present

## 2024-05-07 DIAGNOSIS — Z79899 Other long term (current) drug therapy: Secondary | ICD-10-CM | POA: Insufficient documentation

## 2024-05-07 DIAGNOSIS — D333 Benign neoplasm of cranial nerves: Secondary | ICD-10-CM | POA: Insufficient documentation

## 2024-05-07 LAB — CMP (CANCER CENTER ONLY)
ALT: 27 U/L (ref 0–44)
AST: 21 U/L (ref 15–41)
Albumin: 4 g/dL (ref 3.5–5.0)
Alkaline Phosphatase: 70 U/L (ref 38–126)
Anion gap: 6 (ref 5–15)
BUN: 15 mg/dL (ref 8–23)
CO2: 25 mmol/L (ref 22–32)
Calcium: 9 mg/dL (ref 8.9–10.3)
Chloride: 107 mmol/L (ref 98–111)
Creatinine: 1 mg/dL (ref 0.61–1.24)
GFR, Estimated: >60 mL/min (ref >60)
Glucose, Bld: 108 mg/dL — ABNORMAL HIGH (ref 70–99)
Potassium: 4.4 mmol/L (ref 3.5–5.1)
Sodium: 138 mmol/L (ref 135–145)
Total Bilirubin: 1.2 mg/dL (ref 0.0–1.2)
Total Protein: 6.5 g/dL (ref 6.5–8.1)

## 2024-05-07 LAB — CBC WITH DIFFERENTIAL (CANCER CENTER ONLY)
Abs Immature Granulocytes: 0.02 K/uL (ref 0.00–0.07)
Basophils Absolute: 0.1 K/uL (ref 0.0–0.1)
Basophils Relative: 2 %
Eosinophils Absolute: 0.2 K/uL (ref 0.0–0.5)
Eosinophils Relative: 5 %
HCT: 44.4 % (ref 39.0–52.0)
Hemoglobin: 15.5 g/dL (ref 13.0–17.0)
Immature Granulocytes: 0 %
Lymphocytes Relative: 20 %
Lymphs Abs: 1 K/uL (ref 0.7–4.0)
MCH: 35.2 pg — ABNORMAL HIGH (ref 26.0–34.0)
MCHC: 34.9 g/dL (ref 30.0–36.0)
MCV: 100.9 fL — ABNORMAL HIGH (ref 80.0–100.0)
Monocytes Absolute: 0.7 K/uL (ref 0.1–1.0)
Monocytes Relative: 14 %
Neutro Abs: 2.8 K/uL (ref 1.7–7.7)
Neutrophils Relative %: 59 %
Platelet Count: 129 K/uL — ABNORMAL LOW (ref 150–400)
RBC: 4.4 MIL/uL (ref 4.22–5.81)
RDW: 14.2 % (ref 11.5–15.5)
WBC Count: 4.8 K/uL (ref 4.0–10.5)
nRBC: 0 % (ref 0.0–0.2)

## 2024-05-07 LAB — LACTATE DEHYDROGENASE: LDH: 114 U/L (ref 98–192)

## 2024-05-07 MED ORDER — DENOSUMAB 120 MG/1.7ML ~~LOC~~ SOLN
120.0000 mg | Freq: Once | SUBCUTANEOUS | Status: AC
  Administered 2024-05-07: 120 mg via SUBCUTANEOUS

## 2024-05-07 NOTE — Progress Notes (Signed)
 [facility] [phone]   [phone]  PROGRESS NOTE  Patient Care Team: [name], MD as PCP - General (Family Medicine) Jeffrie Oneil BROCKS, MD as PCP - Cardiology (Cardiology) Rubin Calamity, MD as Consulting Physician (General Surgery) Joshua Blamer, MD as Attending Physician (Dermatology) Buckley Zachary K, MD as Consulting Physician (Psychiatry) [name] Norleen ONEIDA MADISON, MD as Consulting Physician (Hematology and Oncology) Izell Domino, MD as Consulting Physician (Radiation Oncology) Malmfelt, Delon CROME, RN as Oncology Nurse Navigator  Hematological/Oncological History # 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 Further workup showed SPEP M-Spike 4.2 g/dL with IgG-Kappa as monoclonal protein, IgG 5908 mg/dL 8/73/7976: Initial evaluation at [facility]. bone marrow biopsy performed, showed 60% abnormal plasma cells in 60% cellular marrow. Labs showed kappa 35.28 mg/dL, lambda 9.22 mg/dL, K/L ratio 54.17 and M-Spike 4.48 g/dL, IgG kappa monoclonal gammopathy 09/15/2021: CT skeletal survey showed no CT evidence of aggressive osseous lesions.  09/26/2021: transfer care to Dr. [name] at [facility].  09/30/2021: Cycle 1 Day 1 of Dara/Dex 10/06/2021: Cycle 1 Day 8 (added revlimid ) 10/28/2021: Cycle 2 Day 1 of Dara/Rev/Dex 11/25/2021: Cycle 3 Day 1 of Dara/Rev/Dex 5/12/023: Cycle 4 Day 1 of Dara/Rev/Dex 01/20/2022: Cycle 5 Day 1 of Dara/Rev/Dex 02/17/2022: Cycle 6 Day 1 of Dara/Rev/Dex 03/17/2022: Cycle 7 Day 1 of Dara/Rev/Dex 04/14/2022: Cycle 8 Day 1 of Dara/Rev/Dex 05/22/2022: Cycle 9 Day 1 of Dara/Rev/Dex 06/19/2022: Cycle 10 Day 1 of Dara/Rev/Dex 07/17/2022: Cycle 11 Day 1 of Dara/Rev/Dex 08/17/2022: start maintenance revlimid  10 mg PO daily 21 of 28 days.   Interval History:  HASANI DIEMER 83 y.o. male with medical history significant for IgG kappa multiple myeloma who  presents for a follow up visit. The patient's last visit was on 02/13/2024. In the interim since the last visit he continued treatment with maintenance revlimid .   On exam today Mr. Peruski reports he is doing well without any new or concerning symptoms. His energy is fairly stable and he can complete his ADLs on his own. He is tolerating Revlimid  pills without any new toxicities. He denies nausea, vomiting or bowel habits. He denies easy bruising or signs of bleeding. He denies fevers, chills, sweats, shortness of breath, chest pain, cough, headaches, dizziness or bone/back pain.  A full 10 point ROS is otherwise negative.  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 PT denies significant findings  CARDIAC CATHETERIZATION  2008   COLOSTOMY N/A 10/31/2019   Procedure: COLOSTOMY;  Surgeon: Vanderbilt Ned, MD;  Location: [facility];  Service: General;  Laterality: N/A;   CYSTOSCOPY W/ URETERAL STENT PLACEMENT Bilateral 10/31/2019   Procedure: CYSTOSCOPY URETERAL STENT PLACEMENT BILATERAL;   Surgeon: Matilda Senior, MD;  Location: [facility];  Service: Urology;  Laterality: Bilateral;   CYSTOSCOPY WITH STENT PLACEMENT Bilateral 03/11/2020   Procedure: CYSTOSCOPY WITH BILATERAL FIREFLY INJECTION;  Surgeon: Watt Rush, MD;  Location: [facility];  Service: Urology;  Laterality: Bilateral;   EYE SURGERY     BILATERAL CATARACT SURGERY WITH LENS IMPLANTS   FLEXIBLE SIGMOIDOSCOPY N/A 10/29/2019   Procedure: FLEXIBLE SIGMOIDOSCOPY;  Surgeon: Albertus Gordy HERO, MD;  Location: [facility];  Service: Gastroenterology;  Laterality: N/A;   INCISIONAL HERNIA REPAIR N/A 10/13/2020   Procedure: OPEN INCISIONAL HERNIA REPAIR WITH MESH;  Surgeon: Rubin Calamity, MD;  Location: [facility] OR;  Service: General;  Laterality: N/A;   INSERTION OF MESH N/A 09/08/2016   Procedure: INSERTION OF MESH;  Surgeon: Krystal Russell, MD;  Location: [facility];  Service: General;  Laterality: N/A;   IR RADIOLOGIST EVAL & MGMT  11/10/2020   IR RADIOLOGIST EVAL & MGMT  11/25/2020   LAPAROSCOPIC SIGMOID COLECTOMY N/A 10/31/2019   Procedure: DIAGNOSTIC LAPAROSCOPY; EXPLORATORY LAPAROTOMY; SIGMOID COLECTOMY;  Surgeon: Vanderbilt Ned, MD;  Location: [facility];  Service: General;  Laterality: N/A;   right rotator cuff     SUBMUCOSAL TATTOO INJECTION  10/29/2019   Procedure: SUBMUCOSAL TATTOO INJECTION;  Surgeon: Albertus Gordy HERO, MD;  Location: [facility];  Service: Gastroenterology;;   TONSILLECTOMY     UMBILICAL HERNIA REPAIR N/A 09/08/2016   Procedure: OPEN UMBILICAL HERNIA REPAIR WITH MESH;  Surgeon: Krystal Russell, MD;  Location: [facility];  Service: General;  Laterality: N/A;   VENTRAL HERNIA REPAIR N/A 02/03/2021   Procedure: LAPAROSCOPIC VENTRAL HERNIA REPAIR WITH MESH;  Surgeon: Rubin Calamity, MD;  Location[facility] OR;  Service: General;  Laterality: N/A;   XI ROBOTIC ASSISTED COLOSTOMY TAKEDOWN N/A 03/11/2020   Procedure: ROBOTIC ASSISTED COLOSTOMY REVERSAL, RIGID PROCTOSCOPY;  Surgeon: Ned Hila, MD;  Location: [facility];  Service:  General;  Laterality: N/A;    SOCIAL HISTORY: Social History   Socioeconomic History   Marital status: Married    Spouse name: Donnelle Olmeda    Number of children: 2   Years of education: 12   Highest education level: Bachelor's degree (e.g., BA, AB, BS)  Occupational History   Occupation: Retired   Tobacco Use   Smoking status: Never   Smokeless tobacco: Never  Vaping Use   Vaping status: Never Used  Substance and Sexual Activity   Alcohol use: Yes    Alcohol/week: 17.0 standard drinks of alcohol    Types: 3 Glasses of wine, 14 Standard drinks or equivalent per week   Drug use: No   Sexual activity: Not Currently  Other Topics Concern   Not on file  Social History Narrative   Married. 2 children 52 in 48 in 2022- both went to [facility]. 5 grandkids- all girls from oldest 56 looking premed [employer], 53 soph [place], 2 54 year olds, 10 year olds.       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 [employer]: fishing, [place] football and basketball, [place] [employer], yardwork   Social Drivers of Health  Financial Resource Strain: Low Risk  (01/10/2024)   Overall Financial Resource Strain (CARDIA)    Difficulty of Paying Living Expenses: Not hard at all  Food Insecurity: No Food Insecurity (01/10/2024)   Hunger Vital Sign    Worried About Running Out of Food in the Last Year: Never true    Ran Out of Food in the Last Year: Never true  Transportation Needs: No Transportation Needs (01/10/2024)   PRAPARE - Administrator, Civil Service (Medical): No    Lack of Transportation (Non-Medical): No  Physical Activity: Inactive (01/10/2024)   Exercise Vital Sign    Days of Exercise per Week: 0 days    Minutes of Exercise per Session: 0 min  Stress: No Stress Concern Present (01/10/2024)   [employer] of Occupational Health - Occupational Stress Questionnaire     Feeling of Stress : Only a little  Social Connections: Socially Integrated (01/10/2024)   Social Connection and Isolation Panel    Frequency of Communication with Friends and Family: More than three times a week    Frequency of Social Gatherings with Friends and Family: Twice a week    Attends Religious Services: More than 4 times per year    Active Member of [employer] or Organizations: Yes    Attends Engineer, structural: More than 4 times per year    Marital Status: Married  Catering manager Violence: Not At Risk (01/10/2024)   Humiliation, Afraid, Rape, and Kick questionnaire    Fear of Current or Ex-Partner: No    Emotionally Abused: No    Physically Abused: No    Sexually Abused: No    FAMILY HISTORY: Family History  Problem Relation Age of Onset   Diabetes Mother    Heart failure Mother        around 74   CAD Father        31   Melanoma Brother 46   Healthy Daughter    Healthy Son    Colon cancer Neg Hx    Esophageal cancer Neg Hx    Rectal cancer Neg Hx    Stomach cancer Neg Hx     ALLERGIES:  is allergic to demerol, promethazine  hcl, and ativan  [lorazepam ].  MEDICATIONS:  Current Outpatient Medications  Medication Sig Dispense Refill   acetaminophen  (TYLENOL ) 500 MG tablet Take 500 mg by mouth every 6 (six) hours as needed.     aspirin  EC 81 MG tablet Take 81 mg by mouth daily. Swallow whole.     atorvastatin  (LIPITOR) 80 MG tablet Take 1 tablet (80 mg total) by mouth daily. 90 tablet 0   Cholecalciferol (VITAMIN D3) 25 MCG (1000 UT) CAPS Take 1,000 Units by mouth daily.     fexofenadine (ALLEGRA) 180 MG tablet      finasteride  (PROSCAR ) 5 MG tablet Take 5 mg by mouth daily.     fluticasone  (FLONASE ) 50 MCG/ACT nasal spray Place into the nose.     Ibuprofen  200 MG CAPS      lenalidomide  (REVLIMID ) 10 MG capsule Take 1 capsule (10 mg total) by mouth daily. Take 1 capsule daily for 21 days and then none for 7 days. 21 capsule 0   lisinopril  (ZESTRIL ) 10 MG  tablet TAKE 1 TABLET EVERY DAY 90 tablet 3   Magnesium  Oxide 400 MG CAPS      melatonin 5 MG TABS      Omega-3 Fatty Acids (FISH OIL) 1200 MG CAPS Take 1,200 mg by mouth daily.  vitamin B-12 (CYANOCOBALAMIN ) 1000 MCG tablet Take 1,000 mcg by mouth daily.     No current facility-administered medications for this visit.    REVIEW OF SYSTEMS:   Constitutional: ( - ) fevers, ( - )  chills , ( - ) night sweats Eyes: ( - ) blurriness of vision, ( - ) double vision, ( - ) watery eyes Ears, nose, mouth, throat, and face: ( - ) mucositis, ( - ) sore throat Respiratory: ( - ) cough, ( - ) dyspnea, ( - ) wheezes Cardiovascular: ( - ) palpitation, ( - ) chest discomfort, ( - ) lower extremity swelling Gastrointestinal:  ( - ) nausea, ( - ) heartburn, ( - ) change in bowel habits Skin: ( - ) abnormal skin rashes Lymphatics: ( - ) new lymphadenopathy, ( - ) easy bruising Neurological: ( + ) numbness, ( - ) tingling, ( - ) new weaknesses Behavioral/Psych: ( - ) mood change, ( - ) new changes  All other systems were reviewed with the patient and are negative.  PHYSICAL EXAMINATION: ECOG PERFORMANCE STATUS: 0 - Asymptomatic  Vitals:   05/07/24 1301  BP: 126/78  Pulse: 62  Resp: 13  Temp: 97.7 F (36.5 C)  SpO2: 97%        Filed Weights   05/07/24 1301  Weight: 197 lb 12.8 oz (89.7 kg)      GENERAL: Well-appearing elderly Caucasian male, alert, no distress and comfortable SKIN: skin color, texture, turgor are normal, no rashes or significant lesions EYES: conjunctiva are pink and non-injected, sclera clear LUNGS: clear to auscultation and percussion with normal breathing effort HEART: regular rate & rhythm and no murmurs and no lower extremity edema Musculoskeletal: no cyanosis of digits and no clubbing  PSYCH: alert & oriented x 3, fluent speech NEURO: no focal motor/sensory deficits  LABORATORY DATA:  I have reviewed the data as listed    Latest Ref Rng & Units 05/07/2024    12:33 PM 04/09/2024    1:41 PM 03/12/2024    1:23 PM  CBC  WBC 4.0 - 10.5 K/uL 4.8  5.0  4.2   Hemoglobin 13.0 - 17.0 g/dL 84.4  85.1  84.7   Hematocrit 39.0 - 52.0 % 44.4  42.6  43.6   Platelets 150 - 400 K/uL 129  141  147        Latest Ref Rng & Units 04/09/2024    1:41 PM 03/12/2024    1:23 PM 02/13/2024   10:35 AM  CMP  Glucose 70 - 99 mg/dL 888  883  889   BUN 8 - 23 mg/dL 16  16  17    Creatinine 0.61 - 1.24 mg/dL 8.95  8.98  8.93   Sodium 135 - 145 mmol/L 137  142  139   Potassium 3.5 - 5.1 mmol/L 4.1  4.1  4.3   Chloride 98 - 111 mmol/L 105  109  106   CO2 22 - 32 mmol/L 28  26  26    Calcium  8.9 - 10.3 mg/dL 8.9  8.7  9.0   Total Protein 6.5 - 8.1 g/dL 6.0  6.8  6.8   Total Bilirubin 0.0 - 1.2 mg/dL 1.0  1.4  1.0   Alkaline Phos 38 - 126 U/L 63  71  70   AST 15 - 41 U/L 24  25  21    ALT 0 - 44 U/L 34  31  28     Lab Results  Component Value Date  MPROTEIN Not Observed 04/09/2024   MPROTEIN Not Observed 03/12/2024   MPROTEIN Not Observed 02/13/2024   Lab Results  Component Value Date   KPAFRELGTCHN 20.2 (H) 04/09/2024   KPAFRELGTCHN 19.2 03/12/2024   KPAFRELGTCHN 23.1 (H) 02/13/2024   LAMBDASER 15.1 04/09/2024   LAMBDASER 12.7 03/12/2024   LAMBDASER 16.1 02/13/2024   KAPLAMBRATIO 1.34 04/09/2024   KAPLAMBRATIO 1.51 03/12/2024   KAPLAMBRATIO 1.43 02/13/2024    RADIOGRAPHIC STUDIES: CT Soft Tissue Neck W Contrast Result Date: 04/18/2024 CLINICAL DATA:  Head and neck carcinoma, squamous cell carcinoma left cheek EXAM: CT NECK WITH CONTRAST TECHNIQUE: Multidetector CT imaging of the neck was performed using the standard protocol following the bolus administration of intravenous contrast. RADIATION DOSE REDUCTION: This exam was performed according to the departmental dose-optimization program which includes automated exposure control, adjustment of the mA and/or kV according to patient size and/or use of iterative reconstruction technique. CONTRAST:  75mL OMNIPAQUE   IOHEXOL  300 MG/ML  SOLN COMPARISON:  December 04, 2023 FINDINGS: There is some ill-defined subcutaneous density in the left cheek. This is similar to the previous study. Pharynx: The nasopharynx, oropharynx and hypopharynx are normal Oral cavity/floor of mouth: Normal Larynx: Normal Salivary glands: The parotid and submandibular glands are normal Thyroid : Normal Lymph nodes: No adenopathy Vascular: No significant abnormality Limited intracranial: No significant abnormality Visualized orbits: No significant abnormality Mastoids and visualized paranasal sinuses: No significant abnormality Skeleton: Cervical spondylosis Upper chest: No significant abnormality Other: None IMPRESSION: There is ill-defined subcutaneous density in the left cheek similar to the prior study. No enlarging mass.  No adenopathy. Electronically Signed   By: Nancyann Burns M.D.   On: 04/18/2024 12:30    ASSESSMENT & PLAN ZOLTON DOWSON is a 83 y.o. male with medical history significant for IgG kappa multiple myeloma who presents for a follow up visit.   # IgG Kappa Multiple Myeloma --Patient meets diagnostic criteria for multiple myeloma based on the presence of 60% plasma cells in the bone marrow --Pretreatment phenotype RBC as well as type and screen ordered  --Cycle 1 Day 1of Dara/Rev/Dex started on 09/30/2021. He started without revlimid  which was added on 10/06/2021 (Cycle 1 Day 8)  --Transitioned to maintenance Revlimid  on 08/17/2022.  Plan:  --Currently on maintenance revlimid  10 mg PO daily 21 of 28 days.  --Labs today showed WBC 4.3, Hgb 15.3, MCV 104.5, PLT 165. Creatinine and LFTs normal. Myeloma labs pending today --Most recent myeloma labs from 10/24/2023 did not detect M protein with normal sFLC. 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  #Lethargy 2/2 Zometa :   --Discussed alternative which would be Xgeva  q 28 days.  -- continue monthly Xgeva .    # Boils on Abdomen-stable -- Patient underwent I&D and subsequent antibiotic treatment with no resolution.  2 lesions have developed at the site of his prior ostomy.  A new lesion was present on 06/19/2022. --Patient was seen by surgery who ordered CT scan on 01/31/2022 that showed no fluid collection in the anterior abdominal wall. Instructed patient on how to pack wounds.  --No systemic symptoms, okay to continue with systemic chemotherapy. --continue to follow with surgery.   # Right hand neuropathy:  --Suspect carpal tunnel so recommend wearing a wrist brace.  --follows with [facility], next visit Sept 2025  #Supportive Care -- chemotherapy education complete  -- port  placement not required.  --ASA 81 mg p.o. daily for thromboprophylaxis on Revlimid . -- zofran  8mg  q8H PRN and compazine  10mg  PO q6H for nausea -- zometa  clearance obtained, started on 03/03/2022. Had lethargy with zometa , will be converting to Xgeva .  Continue monthly injections or Xgeva  x 2 years (July 2025).  -- no pain medication required at this time.   No orders of the defined types were placed in this encounter.  All questions were answered. The patient knows to call the clinic with any problems, questions or concerns.  I have spent a total of 30 minutes minutes of face-to-face and non-face-to-face time, preparing to see the patient, performing a medically appropriate examination, counseling and educating the patient, ordering medications/tests, documenting clinical information in the electronic health record,  and care coordination.   Johnston Police PA-C Dept of Hematology and Oncology [facility] Phone: [phone]  05/07/2024 1:03 PM

## 2024-05-08 ENCOUNTER — Ambulatory Visit (HOSPITAL_COMMUNITY): Admission: RE | Admit: 2024-05-08 | Source: Ambulatory Visit

## 2024-05-08 LAB — KAPPA/LAMBDA LIGHT CHAINS
Kappa free light chain: 22.7 mg/L — ABNORMAL HIGH (ref 3.3–19.4)
Kappa, lambda light chain ratio: 1.58 (ref 0.26–1.65)
Lambda free light chains: 14.4 mg/L (ref 5.7–26.3)

## 2024-05-09 ENCOUNTER — Ambulatory Visit (HOSPITAL_COMMUNITY)
Admission: RE | Admit: 2024-05-09 | Discharge: 2024-05-09 | Disposition: A | Discharge status: Home / Self Care | Source: Ambulatory Visit | Attending: Internal Medicine | Admitting: Internal Medicine

## 2024-05-09 DIAGNOSIS — D333 Benign neoplasm of cranial nerves: Secondary | ICD-10-CM | POA: Insufficient documentation

## 2024-05-09 MED ORDER — GADOBUTROL 1 MMOL/ML IV SOLN
9.0000 mL | Freq: Once | INTRAVENOUS | Status: AC | PRN
  Administered 2024-05-09: 9 mL via INTRAVENOUS

## 2024-05-12 ENCOUNTER — Encounter: Payer: Self-pay | Admitting: *Deleted

## 2024-05-12 ENCOUNTER — Inpatient Hospital Stay: Payer: Medicare Other | Admitting: Internal Medicine

## 2024-05-12 VITALS — BP 112/69 | HR 63 | Temp 98.2°F | Resp 18 | Ht 69.0 in | Wt 196.0 lb

## 2024-05-12 DIAGNOSIS — D333 Benign neoplasm of cranial nerves: Secondary | ICD-10-CM

## 2024-05-12 DIAGNOSIS — Z79899 Other long term (current) drug therapy: Secondary | ICD-10-CM | POA: Diagnosis not present

## 2024-05-12 DIAGNOSIS — C9000 Multiple myeloma not having achieved remission: Secondary | ICD-10-CM | POA: Diagnosis not present

## 2024-05-12 LAB — MULTIPLE MYELOMA PANEL, SERUM
Albumin SerPl Elph-Mcnc: 3.2 g/dL (ref 2.9–4.4)
Albumin/Glob SerPl: 1.2 (ref 0.7–1.7)
Alpha 1: 0.2 g/dL (ref 0.0–0.4)
Alpha2 Glob SerPl Elph-Mcnc: 0.7 g/dL (ref 0.4–1.0)
B-Globulin SerPl Elph-Mcnc: 1 g/dL (ref 0.7–1.3)
Gamma Glob SerPl Elph-Mcnc: 0.8 g/dL (ref 0.4–1.8)
Globulin, Total: 2.8 g/dL (ref 2.2–3.9)
IgA: 265 mg/dL (ref 61–437)
IgG (Immunoglobin G), Serum: 871 mg/dL (ref 603–1613)
IgM (Immunoglobulin M), Srm: 51 mg/dL (ref 15–143)
Total Protein ELP: 6 g/dL (ref 6.0–8.5)

## 2024-05-12 NOTE — Progress Notes (Signed)
 [facility] [address]  [place], [place] [address] [phone]   Interval Evaluation  Date of Service: 05/12/24 Patient Name: Wesley [name] Patient MRN: [id number] Patient DOB: 06/27/1941 Provider: Arthea MARLA Manns, MD  Identifying Statement:  Wesley [name] is a 83 y.o. male with right vestibular schwannoma   CNS Oncologic History 04/04/21: MRI demonstrates small R vestibular schwannoma after provoked fall  Interval History: Wesley [name] presents today for follow up after recent MRI brain.  Doesn't report any neurologic symptoms today.  No changes in hearing issues. Continues on Revlimid  with Dr. [name] for myeloma.  Prior- No new or progressive neurologic changes described today, although he does describe mild daily headache for past 4-3 months.  He has been dosing ibuprofen  3x per day over that time period.  No changes in his hearing or recurrence of vertigo.  Continues on myeloma rx (dara and revlimid ) with Dr. [name].   H+P (04/19/21) Patient presented to medical attention on 8/22 after fall and head trauma, provoked by tripping over his dogs.  He hit the side of his head on some furniture; next day developed vertigo symptoms, CNS imaging subsequently demonstrated a right sided vestibular schwannoma.  Since then, headaches have resolved, but he still experiences sometimes intense vertigo upon sitting up or laying down, duration is seconds to a minute and it does not persist between episodes.  Otherwise denies double vision, facial weakness or numbness.  Functionally intact and independent at home.  Medications: Current Outpatient Medications on File Prior to Visit  Medication Sig Dispense Refill   acetaminophen  (TYLENOL ) 500 MG tablet Take 500 mg by mouth every 6 (six) hours as needed.     aspirin  EC 81 MG tablet Take 81 mg by mouth daily. Swallow whole.     atorvastatin  (LIPITOR) 80 MG tablet Take 1 tablet (80 mg total) by  mouth daily. 90 tablet 0   Cholecalciferol (VITAMIN D3) 25 MCG (1000 UT) CAPS Take 1,000 Units by mouth daily.     fexofenadine (ALLEGRA) 180 MG tablet      finasteride  (PROSCAR ) 5 MG tablet Take 5 mg by mouth daily.     fluticasone  (FLONASE ) 50 MCG/ACT nasal spray Place into the nose.     Ibuprofen  200 MG CAPS      lenalidomide  (REVLIMID ) 10 MG capsule Take 1 capsule (10 mg total) by mouth daily. Take 1 capsule daily for 21 days and then none for 7 days. 21 capsule 0   lisinopril  (ZESTRIL ) 10 MG tablet TAKE 1 TABLET EVERY DAY 90 tablet 3   Magnesium  Oxide 400 MG CAPS      melatonin 5 MG TABS      Omega-3 Fatty Acids (FISH OIL) 1200 MG CAPS Take 1,200 mg by mouth daily.     vitamin B-12 (CYANOCOBALAMIN ) 1000 MCG tablet Take 1,000 mcg by mouth daily.     No current facility-administered medications on file prior to visit.    Allergies:  Allergies  Allergen Reactions   Demerol Other (See Comments)    Causes dizziness   Promethazine  Hcl Other (See Comments)    Dizziness    Ativan  [Lorazepam ]     Incontinence per pt   Past Medical History:  Past Medical History:  Diagnosis Date   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 symptoms (LUTS)    CAD (coronary artery disease)    Cholelithiasis    Chronic allergic rhinitis    Coronary atherosclerosis of native  coronary artery    Fatty liver    GERD (gastroesophageal reflux disease)    HX 2yrs ago, no longer a problem, no meds   High blood pressure    Hypercholesterolemia    Macrocytosis without anemia    Multiple myeloma not having achieved remission (HCC) 09/26/2021   OSA on CPAP    Paraesophageal hernia    Partial bowel obstruction (HCC)    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 PT denies significant findings   CARDIAC CATHETERIZATION  2008   COLOSTOMY N/A 10/31/2019   Procedure: COLOSTOMY;  Surgeon: Vanderbilt Ned, MD;  Location: [facility];  Service: General;  Laterality: N/A;   CYSTOSCOPY W/ URETERAL STENT PLACEMENT Bilateral 10/31/2019   Procedure: CYSTOSCOPY URETERAL STENT PLACEMENT BILATERAL;  Surgeon: Matilda Senior, MD;  Location: [facility];  Service: Urology;  Laterality: Bilateral;   CYSTOSCOPY WITH STENT PLACEMENT Bilateral 03/11/2020   Procedure: CYSTOSCOPY WITH BILATERAL FIREFLY INJECTION;  Surgeon: Watt Rush, MD;  Location: [facility];  Service: Urology;  Laterality: Bilateral;   EYE SURGERY     BILATERAL CATARACT SURGERY WITH LENS IMPLANTS   FLEXIBLE SIGMOIDOSCOPY N/A 10/29/2019   Procedure: FLEXIBLE SIGMOIDOSCOPY;  Surgeon: Albertus Gordy HERO, MD;  Location: [facility];  Service: Gastroenterology;  Laterality: N/A;   INCISIONAL HERNIA REPAIR N/A 10/13/2020   Procedure: OPEN INCISIONAL HERNIA REPAIR WITH MESH;  Surgeon: Rubin Calamity, MD;  [facility];  Service: General;  Laterality: N/A;   INSERTION OF MESH N/A 09/08/2016   Procedure: INSERTION OF MESH;  Surgeon: Krystal Russell, MD;  Location: [facility];  Service: General;  Laterality: N/A;   IR RADIOLOGIST EVAL & MGMT  11/10/2020   IR RADIOLOGIST EVAL & MGMT  11/25/2020   LAPAROSCOPIC SIGMOID COLECTOMY N/A 10/31/2019   Procedure: DIAGNOSTIC LAPAROSCOPY; EXPLORATORY LAPAROTOMY; SIGMOID COLECTOMY;  Surgeon: Vanderbilt Ned, MD;  Location: [facility];  Service: General;  Laterality: N/A;   right rotator cuff     SUBMUCOSAL TATTOO INJECTION  10/29/2019   Procedure: SUBMUCOSAL TATTOO INJECTION;  Surgeon: Albertus Gordy HERO, MD;  Location: [facility];  Service: Gastroenterology;;   TONSILLECTOMY     UMBILICAL HERNIA REPAIR N/A 09/08/2016   Procedure: OPEN UMBILICAL HERNIA REPAIR WITH MESH;  Surgeon: Krystal Russell, MD;   Location: [facility];  Service: General;  Laterality: N/A;   VENTRAL HERNIA REPAIR N/A 02/03/2021   Procedure: LAPAROSCOPIC VENTRAL HERNIA REPAIR WITH MESH;  Surgeon: Rubin Calamity, MD;  [facility] OR;  Service: General;  Laterality: N/A;   XI ROBOTIC ASSISTED COLOSTOMY TAKEDOWN N/A 03/11/2020   Procedure: ROBOTIC ASSISTED COLOSTOMY REVERSAL, RIGID PROCTOSCOPY;  Surgeon: Ned Hila, MD;  Location: [facility];  Service: General;  Laterality: N/A;   Social History:  Social History   Socioeconomic History   Marital status: Married    Spouse name: Bianca Vester    Number of children: 2   Years of education: 12   Highest education level: Bachelor's degree (e.g., BA, AB, BS)  Occupational History   Occupation: Retired   Tobacco Use   Smoking status: Never   Smokeless tobacco: Never  Vaping Use   Vaping status: Never Used  Substance  and Sexual Activity   Alcohol use: Yes    Alcohol/week: 17.0 standard drinks of alcohol    Types: 3 Glasses of wine, 14 Standard drinks or equivalent per week   Drug use: No   Sexual activity: Not Currently  Other Topics Concern   Not on file  Social History Narrative   Married. 2 children 52 in 48 in 2022- both went to [employer] [facility]. 5 grandkids- all girls from oldest 9 looking premed [employer], 82 soph [place], 2 28 year olds, 10 year olds.       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 [employer]: fishing, [employer] football and basketball, [place] [employer], yardwork   Social Drivers of Health   Financial Resource Strain: Low Risk  (01/10/2024)   Overall Financial Resource Strain (CARDIA)    Difficulty of Paying Living Expenses: Not hard at all  Food Insecurity: No Food Insecurity (01/10/2024)   Hunger Vital Sign    Worried About Programme researcher, broadcasting/film/video in the Last Year: Never true    Ran Out of Food in the Last Year: Never true  Transportation Needs: No  Transportation Needs (01/10/2024)   PRAPARE - Administrator, Civil Service (Medical): No    Lack of Transportation (Non-Medical): No  Physical Activity: Inactive (01/10/2024)   Exercise Vital Sign    Days of Exercise per Week: 0 days    Minutes of Exercise per Session: 0 min  Stress: No Stress Concern Present (01/10/2024)   [employer] of Occupational Health - Occupational Stress Questionnaire    Feeling of Stress : Only a little  Social Connections: Socially Integrated (01/10/2024)   Social Connection and Isolation Panel    Frequency of Communication with Friends and Family: More than three times a week    Frequency of Social Gatherings with Friends and Family: Twice a week    Attends Religious Services: More than 4 times per year    Active Member of [employer] or Organizations: Yes    Attends Engineer, structural: More than 4 times per year    Marital Status: Married  Catering manager Violence: Not At Risk (01/10/2024)   Humiliation, Afraid, Rape, and Kick questionnaire    Fear of Current or Ex-Partner: No    Emotionally Abused: No    Physically Abused: No    Sexually Abused: No   Family History:  Family History  Problem Relation Age of Onset   Diabetes Mother    Heart failure Mother        around 61   CAD Father        72   Melanoma Brother 35   Healthy Daughter    Healthy Son    Colon cancer Neg Hx    Esophageal cancer Neg Hx    Rectal cancer Neg Hx    Stomach cancer Neg Hx     Review of Systems: Constitutional: Doesn't report fevers, chills or abnormal weight loss Eyes: Doesn't report blurriness of vision Ears, nose, mouth, throat, and face: Doesn't report sore throat Respiratory: Doesn't report cough, dyspnea or wheezes Cardiovascular: Doesn't report palpitation, chest discomfort  Gastrointestinal:  Doesn't report nausea, constipation, diarrhea GU: Doesn't report incontinence Skin: Doesn't report skin rashes Neurological: Per  HPI Musculoskeletal: Doesn't report joint pain Behavioral/Psych: Doesn't report anxiety  Physical Exam: Vitals:   05/12/24 1112  BP: 112/69  Pulse: 63  Resp: 18  Temp: 98.2 F (36.8 C)  SpO2: 97%   KPS: 90. General: Alert, cooperative, pleasant, in no acute distress Head: Normal EENT: No conjunctival injection or scleral icterus.  Lungs: Resp effort normal Cardiac: Regular rate Abdomen: Non-distended abdomen Skin: No rashes cyanosis or petechiae. Extremities: No clubbing or edema  Neurologic Exam: Mental Status: Awake, alert, attentive to examiner. Oriented to self and environment. Language is fluent with intact comprehension.  Cranial Nerves: Visual acuity is grossly normal. Visual fields are full. Extra-ocular movements intact. No ptosis. Face is symmetric Motor: Tone and bulk are normal. Power is full in both arms and legs. Reflexes are symmetric, no pathologic reflexes present.  Sensory: Intact to light touch Gait: Normal.   Labs: I have reviewed the data as listed    Component Value Date/Time   NA 138 05/07/2024 1233   K 4.4 05/07/2024 1233   CL 107 05/07/2024 1233   CO2 25 05/07/2024 1233   GLUCOSE 108 (H) 05/07/2024 1233   BUN 15 05/07/2024 1233   CREATININE 1.00 05/07/2024 1233   CALCIUM  9.0 05/07/2024 1233   PROT 6.5 05/07/2024 1233   PROT 10.4 (HH) 08/01/2021 1117   ALBUMIN 4.0 05/07/2024 1233   AST 21 05/07/2024 1233   ALT 27 05/07/2024 1233   ALKPHOS 70 05/07/2024 1233   BILITOT 1.2 05/07/2024 1233   GFRNONAA >60 05/07/2024 1233   GFRAA >60 03/14/2020 0553   Lab Results  Component Value Date   WBC 4.8 05/07/2024   NEUTROABS 2.8 05/07/2024   HGB 15.5 05/07/2024   HCT 44.4 05/07/2024   MCV 100.9 (H) 05/07/2024   PLT 129 (L) 05/07/2024   Audiology: 06/27/21 The hearing evaluation revealed 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 In the right ear, mild loss sloped to severe loss with the exception of WNL hearing sensitivity for 1000  and 2000 Hz. Slight mixed component in the low frequencies. In the left ear, thresholds were WNL from 250- 3000 Hz, then mild to moderate loss for 4000, 6000, and 8000 Hz. Results were obtained using headphones then inserts.  Imaging:  [facility] Clinician Interpretation: I have personally reviewed the CNS images as listed.  My interpretation, in the context of the patient's clinical presentation, is stable disease   MR BRAIN W WO CONTRAST Result Date: 05/09/2024 CLINICAL DATA:  Follow-up right vestibular schwannoma EXAM: MRI HEAD WITHOUT AND WITH CONTRAST TECHNIQUE: Multiplanar, multiecho pulse sequences of the brain and surrounding structures were obtained without and with intravenous contrast. CONTRAST:  9mL GADAVIST  GADOBUTROL  1 MMOL/ML IV SOLN COMPARISON:  May 07, 2023 FINDINGS: MRI brain: There is a 9 x 11 x 7 mm right vestibular schwannoma. The left-side is normal. There are several foci of T2 hyperintensity in the cerebral white matter. These do not have restricted diffusion. No abnormal enhancement in the brain. There is no acute or chronic infarct. The ventricles are normal. No other mass lesion. There are normal flow signals in the carotid arteries and basilar artery. No significant bone marrow signal abnormality. No significant abnormality in the paranasal sinuses or soft tissues. IMPRESSION: 9 x 11 x 7 mm right vestibular schwannoma not significantly changed compared with May 07, 2023. Electronically Signed   By: Nancyann Burns M.D.   On: 05/09/2024 16:09   CT Soft Tissue Neck W Contrast Result Date: 04/18/2024 CLINICAL DATA:  Head and neck carcinoma, squamous cell carcinoma left cheek EXAM: CT NECK WITH CONTRAST TECHNIQUE: Multidetector CT imaging of the neck was  performed using the standard protocol following the bolus administration of intravenous contrast. RADIATION DOSE REDUCTION: This exam was performed according to the departmental dose-optimization program which includes automated  exposure control, adjustment of the mA and/or kV according to patient size and/or use of iterative reconstruction technique. CONTRAST:  75mL OMNIPAQUE  IOHEXOL  300 MG/ML  SOLN COMPARISON:  December 04, 2023 FINDINGS: There is some ill-defined subcutaneous density in the left cheek. This is similar to the previous study. Pharynx: The nasopharynx, oropharynx and hypopharynx are normal Oral cavity/floor of mouth: Normal Larynx: Normal Salivary glands: The parotid and submandibular glands are normal Thyroid : Normal Lymph nodes: No adenopathy Vascular: No significant abnormality Limited intracranial: No significant abnormality Visualized orbits: No significant abnormality Mastoids and visualized paranasal sinuses: No significant abnormality Skeleton: Cervical spondylosis Upper chest: No significant abnormality Other: None IMPRESSION: There is ill-defined subcutaneous density in the left cheek similar to the prior study. No enlarging mass.  No adenopathy. Electronically Signed   By: Nancyann Burns M.D.   On: 04/18/2024 12:30    Assessment/Plan Vestibular schwannoma (HCC)  Wesley [name] is clinically stable with regards to hearing, vestibular function.  MRI brain demonstrates stable findings going back to 2022 study.  We again recommended continued imaging surveillance, with repeat MRI brain in 18 months.  He will con't to follow up with regular audiology, ENT, as well as Dr. [name] for myeloma treatments.    All questions were answered. The patient knows to call the clinic with any problems, questions or concerns. No barriers to learning were detected.  The total time spent in the encounter was 40 minutes and more than 50% was on counseling and review of test results   Arthea MARLA Manns, MD Medical Director of Neuro-Oncology [facility] 05/12/24 11:04 AM

## 2024-05-12 NOTE — Progress Notes (Signed)
 Spoke with [facility] [phone] to confirm that they can see the referral for audiology.  They will review and then reach out to the patient.

## 2024-05-13 DIAGNOSIS — L570 Actinic keratosis: Secondary | ICD-10-CM | POA: Diagnosis not present

## 2024-05-13 DIAGNOSIS — Z85828 Personal history of other malignant neoplasm of skin: Secondary | ICD-10-CM | POA: Diagnosis not present

## 2024-05-13 DIAGNOSIS — D0339 Melanoma in situ of other parts of face: Secondary | ICD-10-CM | POA: Diagnosis not present

## 2024-05-13 DIAGNOSIS — D1801 Hemangioma of skin and subcutaneous tissue: Secondary | ICD-10-CM | POA: Diagnosis not present

## 2024-05-13 DIAGNOSIS — C44321 Squamous cell carcinoma of skin of nose: Secondary | ICD-10-CM | POA: Diagnosis not present

## 2024-05-13 DIAGNOSIS — L821 Other seborrheic keratosis: Secondary | ICD-10-CM | POA: Diagnosis not present

## 2024-05-13 DIAGNOSIS — L812 Freckles: Secondary | ICD-10-CM | POA: Diagnosis not present

## 2024-05-13 DIAGNOSIS — D485 Neoplasm of uncertain behavior of skin: Secondary | ICD-10-CM | POA: Diagnosis not present

## 2024-05-14 ENCOUNTER — Encounter: Payer: Self-pay | Admitting: Hematology and Oncology

## 2024-05-14 ENCOUNTER — Other Ambulatory Visit: Payer: Self-pay | Admitting: Hematology and Oncology

## 2024-05-14 DIAGNOSIS — C9000 Multiple myeloma not having achieved remission: Secondary | ICD-10-CM

## 2024-05-15 ENCOUNTER — Other Ambulatory Visit: Payer: Self-pay | Admitting: *Deleted

## 2024-05-15 DIAGNOSIS — C9000 Multiple myeloma not having achieved remission: Secondary | ICD-10-CM

## 2024-05-15 MED ORDER — LENALIDOMIDE 10 MG PO CAPS: 10.0000 mg | ORAL_CAPSULE | Freq: Every day | ORAL | 0 refills | Status: DC

## 2024-05-27 ENCOUNTER — Encounter: Payer: Self-pay | Admitting: Hematology and Oncology

## 2024-06-06 ENCOUNTER — Inpatient Hospital Stay

## 2024-06-06 ENCOUNTER — Telehealth: Payer: Self-pay | Admitting: *Deleted

## 2024-06-06 NOTE — Telephone Encounter (Signed)
 See previous note

## 2024-06-06 NOTE — Telephone Encounter (Signed)
 TCT patient as he missed his appts for today. Spoke with him. He states  he called scheduling earlier today and left a message. Advised that it was fine-we can get him rescheduled for next week. Pt voiced understanding. Scheduling message sent

## 2024-06-08 ENCOUNTER — Other Ambulatory Visit: Payer: Self-pay | Admitting: Hematology and Oncology

## 2024-06-08 DIAGNOSIS — C9000 Multiple myeloma not having achieved remission: Secondary | ICD-10-CM

## 2024-06-09 ENCOUNTER — Other Ambulatory Visit: Payer: Self-pay | Admitting: *Deleted

## 2024-06-09 DIAGNOSIS — C9000 Multiple myeloma not having achieved remission: Secondary | ICD-10-CM

## 2024-06-09 MED ORDER — LENALIDOMIDE 10 MG PO CAPS: 10.0000 mg | ORAL_CAPSULE | Freq: Every day | ORAL | 0 refills | Status: DC

## 2024-06-11 ENCOUNTER — Inpatient Hospital Stay: Attending: Hematology and Oncology

## 2024-06-11 ENCOUNTER — Inpatient Hospital Stay

## 2024-06-11 VITALS — BP 114/75 | HR 63 | Temp 97.6°F | Resp 17

## 2024-06-11 DIAGNOSIS — C9000 Multiple myeloma not having achieved remission: Secondary | ICD-10-CM | POA: Insufficient documentation

## 2024-06-11 DIAGNOSIS — C9001 Multiple myeloma in remission: Secondary | ICD-10-CM

## 2024-06-11 DIAGNOSIS — Z79899 Other long term (current) drug therapy: Secondary | ICD-10-CM | POA: Diagnosis not present

## 2024-06-11 LAB — CBC WITH DIFFERENTIAL (CANCER CENTER ONLY)
Abs Immature Granulocytes: 0.02 K/uL (ref 0.00–0.07)
Basophils Absolute: 0.1 K/uL (ref 0.0–0.1)
Basophils Relative: 2 %
Eosinophils Absolute: 0.3 K/uL (ref 0.0–0.5)
Eosinophils Relative: 7 %
HCT: 45 % (ref 39.0–52.0)
Hemoglobin: 15.5 g/dL (ref 13.0–17.0)
Immature Granulocytes: 0 %
Lymphocytes Relative: 23 %
Lymphs Abs: 1.1 K/uL (ref 0.7–4.0)
MCH: 35.1 pg — ABNORMAL HIGH (ref 26.0–34.0)
MCHC: 34.4 g/dL (ref 30.0–36.0)
MCV: 102 fL — ABNORMAL HIGH (ref 80.0–100.0)
Monocytes Absolute: 0.7 K/uL (ref 0.1–1.0)
Monocytes Relative: 15 %
Neutro Abs: 2.4 K/uL (ref 1.7–7.7)
Neutrophils Relative %: 53 %
Platelet Count: 161 K/uL (ref 150–400)
RBC: 4.41 MIL/uL (ref 4.22–5.81)
RDW: 14.6 % (ref 11.5–15.5)
WBC Count: 4.5 K/uL (ref 4.0–10.5)
nRBC: 0 % (ref 0.0–0.2)

## 2024-06-11 LAB — LACTATE DEHYDROGENASE: LDH: 129 U/L (ref 98–192)

## 2024-06-11 LAB — CMP (CANCER CENTER ONLY)
ALT: 28 U/L (ref 0–44)
AST: 22 U/L (ref 15–41)
Albumin: 4 g/dL (ref 3.5–5.0)
Alkaline Phosphatase: 70 U/L (ref 38–126)
Anion gap: 8 (ref 5–15)
BUN: 19 mg/dL (ref 8–23)
CO2: 23 mmol/L (ref 22–32)
Calcium: 9.3 mg/dL (ref 8.9–10.3)
Chloride: 107 mmol/L (ref 98–111)
Creatinine: 1 mg/dL (ref 0.61–1.24)
GFR, Estimated: >60 mL/min (ref >60)
Glucose, Bld: 104 mg/dL — ABNORMAL HIGH (ref 70–99)
Potassium: 3.9 mmol/L (ref 3.5–5.1)
Sodium: 138 mmol/L (ref 135–145)
Total Bilirubin: 1.3 mg/dL — ABNORMAL HIGH (ref 0.0–1.2)
Total Protein: 6.6 g/dL (ref 6.5–8.1)

## 2024-06-11 MED ORDER — DENOSUMAB 120 MG/1.7ML ~~LOC~~ SOLN
120.0000 mg | Freq: Once | SUBCUTANEOUS | Status: AC
  Administered 2024-06-11: 120 mg via SUBCUTANEOUS
  Filled 2024-06-11: qty 1.7

## 2024-06-12 DIAGNOSIS — Z23 Encounter for immunization: Secondary | ICD-10-CM | POA: Diagnosis not present

## 2024-06-12 LAB — KAPPA/LAMBDA LIGHT CHAINS
Kappa free light chain: 20.6 mg/L — ABNORMAL HIGH (ref 3.3–19.4)
Kappa, lambda light chain ratio: 1.32 (ref 0.26–1.65)
Lambda free light chains: 15.6 mg/L (ref 5.7–26.3)

## 2024-06-13 ENCOUNTER — Ambulatory Visit: Attending: Cardiology | Admitting: Cardiology

## 2024-06-13 ENCOUNTER — Encounter: Payer: Self-pay | Admitting: Cardiology

## 2024-06-13 VITALS — BP 121/74 | HR 73 | Ht 69.0 in | Wt 197.0 lb

## 2024-06-13 DIAGNOSIS — I700 Atherosclerosis of aorta: Secondary | ICD-10-CM | POA: Insufficient documentation

## 2024-06-13 DIAGNOSIS — I2510 Atherosclerotic heart disease of native coronary artery without angina pectoris: Secondary | ICD-10-CM | POA: Diagnosis not present

## 2024-06-13 DIAGNOSIS — I209 Angina pectoris, unspecified: Secondary | ICD-10-CM | POA: Insufficient documentation

## 2024-06-13 DIAGNOSIS — I10 Essential (primary) hypertension: Secondary | ICD-10-CM | POA: Diagnosis not present

## 2024-06-13 DIAGNOSIS — E7800 Pure hypercholesterolemia, unspecified: Secondary | ICD-10-CM | POA: Insufficient documentation

## 2024-06-13 DIAGNOSIS — I25118 Atherosclerotic heart disease of native coronary artery with other forms of angina pectoris: Secondary | ICD-10-CM

## 2024-06-13 MED ORDER — ATORVASTATIN CALCIUM 80 MG PO TABS: 80.0000 mg | ORAL_TABLET | Freq: Every day | ORAL | 3 refills | Status: AC

## 2024-06-13 NOTE — Progress Notes (Signed)
 Cardiology Office Note:  .   Date:  06/13/2024  ID:  Wesley [name], DOB 09-30-1940, MRN [id number] PCP: Wesley Garnette KIDD, MD  [facility] Providers Cardiologist:  Oneil Parchment, MD    History of Present Illness: Wesley [name] is a 83 y.o. male Discussed the use of AI scribe software  History of Present Illness Wesley [name] is an 83 year old male with coronary artery calcification and aortic atherosclerosis who presents for a follow-up visit.  He has a history of coronary artery calcification and aortic atherosclerosis, with diffuse left anterior descending artery calcification and mild proximal circumflex artery calcification noted on a prior CT scan. A heart catheterization in 2008 was reportedly normal, and stress perfusion imaging in 2017 indicated low risk with no ischemia. He is currently taking atorvastatin .  He experiences fatigue, which he attributes to his history of multiple myeloma. No chest pain is reported. His multiple myeloma is in remission and has been for nearly two years. He receives regular care from a team including Dr. Gilda and previously received treatment at [facility] before transitioning to a local provider for convenience.  He undergoes regular blood work every four weeks, although it is unclear if recent cholesterol levels have been checked. His last known LDL level from 2022 was 52, which was considered excellent. He mentions having a lab available on the first floor of his current location for any additional tests needed.     Studies Reviewed: SABRA   EKG Interpretation Date/Time:  Friday June 13 2024 09:38:54 EDT Ventricular Rate:  73 PR Interval:  200 QRS Duration:  92 QT Interval:  404 QTC Calculation: 445 R Axis:   -36  Text Interpretation: Normal sinus rhythm Left axis deviation Incomplete right bundle branch block When compared with ECG of 08-Apr-2021 12:51, No significant change since last tracing Confirmed by Parchment Oneil  [phone]) on 06/13/2024 9:41:14 AM    Results LABS LDL: 52 (2022) Creatinine: 1 (2022)  RADIOLOGY CT scan: Diffuse LAD calcification and mild proximal circumflex calcification  DIAGNOSTIC REPORTS Heart catheterization: Normal (2008) Stress perfusion imaging: Low risk with no ischemia (2017)  Risk Assessment/Calculations:            Physical Exam:   VS:  BP 121/74   Pulse 73   Ht 5' 9 (1.753 m)   Wt 197 lb (89.4 kg)   SpO2 96%   BMI 29.09 kg/m    Wt Readings from Last 3 Encounters:  06/13/24 197 lb (89.4 kg)  05/12/24 196 lb (88.9 kg)  05/07/24 197 lb 12.8 oz (89.7 kg)    GEN: Well nourished, well developed in no acute distress NECK: No JVD; No carotid bruits CARDIAC: RRR, no murmurs, no rubs, no gallops RESPIRATORY:  Clear to auscultation without rales, wheezing or rhonchi  ABDOMEN: Soft, non-tender, non-distended EXTREMITIES:  No edema; No deformity   ASSESSMENT AND PLAN: .    Assessment and Plan Assessment & Plan Atherosclerotic coronary artery disease with angina (Fatigue) known CAD Coronary artery calcification noted on prior CT scan with diffuse LAD calcification and mild proximal circumflex calcification. Heart catheterization in 2008 was normal. Stress perfusion imaging in 2017 showed low risk with no ischemia. Currently asymptomatic with no chest pain. Considering fatigue, a nuclear stress test is warranted to ensure no high-risk changes. - Order pharmacological nuclear stress test to assess for new ischemic changes. - Check lipid panel. - On ASA, Statin  Essential hypertension/ hyperlipidemia Previous LDL cholesterol level  in 2022 was 52, which is excellent. Current cholesterol levels need updating to ensure continued control. - Check lipid panel.Its been since 2022, continue atorvastatin  80 mg  - Continue lisinopril  10 mg  MM -Revlimid , Dr. [name], remission.          Dispo: 1 yr  Signed, Oneil Parchment, MD

## 2024-06-13 NOTE — Patient Instructions (Signed)
 Medication Instructions:  The current medical regimen is effective;  continue present plan and medications.  *If you need a refill on your cardiac medications before your next appointment, please call your pharmacy*  Lab Work: Please have blood work today (Lipid)  If you have labs (blood work) drawn today and your tests are completely normal, you will receive your results only by: MyChart Message (if you have MyChart) OR A paper copy in the mail If you have any lab test that is abnormal or we need to change your treatment, we will call you to review the results.  Testing/Procedures: You are scheduled for a Lexiscan Myocardial Perfusion Imaging Study on     at    .   Please arrive 15 minutes prior to your appointment time for registration and insurance purposes.   The test will take approximately 3 to 4 hours to complete; you may bring reading material. If someone comes with you to your appointment, they will need to remain in the main lobby due to limited space in the testing area.   How to prepare for your Lexiscan Myocardial Perfusion test:   Do not eat or drink 3 hours prior to your test, except you may have water .    Do not consume products containing caffeine (regular or decaffeinated) 12 hours prior to your test (ex: coffee, chocolate, soda, tea)   Do bring a list of your current medications with you. If not listed below, you may take your medications as normal.   Bring any held medication to your appointment, as you may be required to take it once the test is complete.   Do wear comfortable clothes (no dresses or overalls) and walking shoes. Tennis shoes are preferred. No heels or open toed shoes.  Do not wear cologne, perfume, aftershave or lotions (deodorant is allowed).   If these instructions are not followed, you test will have to be rescheduled.   Please report to [address] [place], [place] for your test. If you have questions or concerns about your appointment,  please call the Nuclear Lab at #[phone].  If you cannot keep your appointment, please provide 24 hour notification to the Nuclear lab to avoid a possible $50 charge to your account.   Follow-Up: At [facility], you and your health needs are our priority.  As part of our continuing mission to provide you with exceptional heart care, our providers are all part of one team.  This team includes your primary Cardiologist (physician) and Advanced Practice Providers or APPs (Physician Assistants and Nurse Practitioners) who all work together to provide you with the care you need, when you need it.  Your next appointment:   1 year(s)  Provider:   Oneil Parchment, MD    We recommend signing up for the patient portal called MyChart.  Sign up information is provided on this After Visit Summary.  MyChart is used to connect with patients for Virtual Visits (Telemedicine).  Patients are able to view lab/test results, encounter notes, upcoming appointments, etc.  Non-urgent messages can be sent to your provider as well.   To learn more about what you can do with MyChart, go to [email or web address].

## 2024-06-16 ENCOUNTER — Telehealth (HOSPITAL_COMMUNITY): Payer: Self-pay | Admitting: *Deleted

## 2024-06-16 NOTE — Telephone Encounter (Signed)
 Left message on voicemail per DPR in reference to upcoming appointment scheduled on 06/23/2024 at 1:15 with detailed instructions given per Myocardial Perfusion Study Information Sheet for the test. LM to arrive 15 minutes early, and that it is imperative to arrive on time for appointment to keep from having the test rescheduled. If you need to cancel or reschedule your appointment, please call the office within 24 hours of your appointment. Failure to do so may result in a cancellation of your appointment, and a $50 no show fee. Phone number given for call back for any questions.

## 2024-06-17 LAB — MULTIPLE MYELOMA PANEL, SERUM
Albumin SerPl Elph-Mcnc: 3.3 g/dL (ref 2.9–4.4)
Albumin/Glob SerPl: 1.2 (ref 0.7–1.7)
Alpha 1: 0.2 g/dL (ref 0.0–0.4)
Alpha2 Glob SerPl Elph-Mcnc: 0.7 g/dL (ref 0.4–1.0)
B-Globulin SerPl Elph-Mcnc: 1.1 g/dL (ref 0.7–1.3)
Gamma Glob SerPl Elph-Mcnc: 0.7 g/dL (ref 0.4–1.8)
Globulin, Total: 2.8 g/dL (ref 2.2–3.9)
IgA: 272 mg/dL (ref 61–437)
IgG (Immunoglobin G), Serum: 868 mg/dL (ref 603–1613)
IgM (Immunoglobulin M), Srm: 46 mg/dL (ref 15–143)
Total Protein ELP: 6.1 g/dL (ref 6.0–8.5)

## 2024-06-19 ENCOUNTER — Ambulatory Visit: Payer: Self-pay

## 2024-06-19 ENCOUNTER — Telehealth (HOSPITAL_COMMUNITY): Payer: Self-pay | Admitting: Cardiology

## 2024-06-19 NOTE — Telephone Encounter (Signed)
 This RN left VM for patient to call back to triage symptoms. Attempt #2

## 2024-06-19 NOTE — Telephone Encounter (Signed)
 Cut lisinopril  in half to 5 mg and update me if remains low/feels poorly- may take him off completely

## 2024-06-19 NOTE — Telephone Encounter (Signed)
 FYI Only or Action Required?: FYI only for provider: appointment scheduled on 06/20/24.  Patient was last seen in primary care on 11/15/2023 by Wesley Garnette KIDD, MD.  Called Nurse Triage reporting Hypotension and Headache.  Symptoms began today.  Interventions attempted: Prescription medications: Lisinopril .  Symptoms are: stable.  Triage Disposition: See Physician Within 24 Hours  Patient/caregiver understands and will follow disposition?: Yes  Copied from CRM [id number]. Topic: Clinical - Medical Advice >> Jun 19, 2024  1:08 PM Ashley R wrote: Reason for CRM: taking lisinopril  - blood pressure, BP dropping, 100/61, fatigue recently stopped drinking wine, light headache. Would like to know if he should continue taking medication or lower its dosage >> Jun 19, 2024  3:27 PM Selinda RAMAN wrote: The patient called back returning a call to nurse triage >> Jun 19, 2024  3:20 PM China J wrote: Patient is returning a call from Fisher, CHARITY FUNDRAISER. I was able to warm transfer to triage.  Reason for Disposition  [1] Systolic BP 90-110 AND [2] taking blood pressure medications AND [3] NOT feeling weak or lightheaded  Answer Assessment - Initial Assessment Questions Stopped drinking wine on Monday. Normally drinks 2-3 glasses per night. Reports drop in BP. Normally 130/70's, this morning 100-110/60. Taking lisinopril  daily, last dose this morning and has not taken any double doses. Only symptom is moderate headache, taking ibuprofen . Denies SOB, CP, weakness or dizziness. Appt scheduled with PCP tomorrow. Advised UC for worsening symptoms.  1. BLOOD PRESSURE: What is your blood pressure? Did you take at least two measurements 5 minutes apart?     BP 100/60, 110/60, HR 64  2. ONSET: When did you take your blood pressure?     This morning   3. HOW: How did you take your blood pressure? (e.g., visiting nurse, automatic home BP monitor)     Electric home cuff  4. HISTORY: Do you have a history of low  blood pressure? What is your blood pressure normally?     130/70  5. MEDICINES: Are you taking any medicines for blood pressure? If Yes, ask: Have they been changed recently?     Lisinopril   6. PULSE RATE: Do you know what your pulse rate is?      64  7. OTHER SYMPTOMS: Have you been sick recently? Have you had a recent injury?     Headache, 5/10.  Protocols used: Blood Pressure - Low-A-AH

## 2024-06-19 NOTE — Telephone Encounter (Signed)
 Patient disconnected prior to transfer to this RN This RN attempted to call patient back---no answer--left a voicemail for the patient to call back  FYI Only or Action Required?: Action required by provider: attempted to call patient three times---.  Patient was last seen in primary care on 11/15/2023 by [name], MD.  Called Nurse Triage reporting Hypotension.  Triage Disposition: No Contact Calls  Patient/caregiver understands and will follow disposition?: Yes     Copied from CRM [id number]. Topic: Clinical - Medical Advice >> Jun 19, 2024  1:08 PM Ashley R wrote: Reason for CRM: taking lisinopril  - blood pressure, BP dropping, 100/61, fatigue recently stopped drinking wine, light headache. Would like to know if he should continue taking medication or lower its dosage    Reason for Disposition  Third attempt to contact caller AND no contact made. Phone number verified.  Answer Assessment - Initial Assessment Questions 3 attempts have been made to contact patient---left voicemails. Please reach out to patient.     Copied from CRM [id number]. Topic: Clinical - Medical Advice >> Jun 19, 2024  1:08 PM Ashley R wrote: Reason for CRM: taking lisinopril  - blood pressure, BP dropping, 100/61, fatigue recently stopped drinking wine, light headache. Would like to know if he should continue taking medication or lower its dosage  Protocols used: No Contact or Duplicate Contact Call-A-AH

## 2024-06-19 NOTE — Telephone Encounter (Signed)
 Patient cancelled Myoview  and did not wish to reschedule.  06/19/2024 11:54 AM [name] [name], Wesley  Cancel Rsn: Patient (Pt doesn't want to reschedule.)   Order will be removed from the [facility].

## 2024-06-19 NOTE — Telephone Encounter (Signed)
Please see patient message and advise.

## 2024-06-19 NOTE — Telephone Encounter (Signed)
 This RN attempted to reach pt to triage sx. VM left to call back. Attempt #1  Copied from CRM #[id number]. Topic: Clinical - Medical Advice >> Jun 19, 2024  1:08 PM Ashley R wrote: Reason for CRM: taking lisinopril  - blood pressure, BP dropping, 100/61, fatigue recently stopped drinking wine, light headache. Would like to know if he should continue taking medication or lower its dosage

## 2024-06-19 NOTE — Telephone Encounter (Signed)
 Pt scheduled to see Dr. Katrinka 06/20/2024.

## 2024-06-20 ENCOUNTER — Encounter: Payer: Self-pay | Admitting: Family Medicine

## 2024-06-20 ENCOUNTER — Ambulatory Visit: Admitting: Family Medicine

## 2024-06-20 VITALS — BP 112/60 | HR 94 | Temp 98.4°F | Ht 69.0 in | Wt 193.2 lb

## 2024-06-20 DIAGNOSIS — I10 Essential (primary) hypertension: Secondary | ICD-10-CM | POA: Diagnosis not present

## 2024-06-20 DIAGNOSIS — K760 Fatty (change of) liver, not elsewhere classified: Secondary | ICD-10-CM | POA: Diagnosis not present

## 2024-06-20 NOTE — Patient Instructions (Addendum)
 he's feeling better already and pressure is coming up some but id still like to cut his lisinopril  in half to 5 mg and have him update me in about a week- hoping his symptom(s) resolve of the fatigue and dizziness completely In this time- if not and blood pressure low . I prefer blood pressure between 110-135 on top probably- don't really want it under that   Recommended follow up: Return for next already scheduled visit or sooner if needed.

## 2024-06-20 NOTE — Progress Notes (Signed)
 Phone [phone] In person visit   Subjective:   Wesley [name] is a 83 y.o. year old very pleasant male patient who presents for/with See problem oriented charting Chief Complaint  Patient presents with   Blood Pressure Issue    Pt reports blood pressure dropping too low; pt stopped drinking alcohol on Monday and BP has been dropping ever since;     Past Medical History-  Patient Active Problem List   Diagnosis Date Noted   S/P hernia repair 10/13/2020    Priority: High   CAD (coronary artery disease) 08/12/2013    Priority: High   B12 deficiency 01/06/2021    Priority: Medium    Vitamin D deficiency 01/06/2021    Priority: Medium    Aortic atherosclerosis 01/06/2021    Priority: Medium    Major depression in full remission 01/06/2021    Priority: Medium    BPH associated with nocturia 01/06/2021    Priority: Medium    Abdominal wall abscess 10/28/2020    Priority: Medium    Essential hypertension 03/15/2011    Priority: Medium    Allergic rhinitis 01/06/2021    Priority: Low   Osteoarthritis of glenohumeral joint, 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     aspirin  EC 81 MG tablet Take 81 mg by mouth daily. Swallow whole.     atorvastatin  (LIPITOR) 80 MG tablet Take 1 tablet (80 mg total) by mouth daily. 90 tablet 3   Cholecalciferol (VITAMIN D3) 25 MCG (1000 UT) CAPS Take 1,000 Units by mouth daily.     fexofenadine (ALLEGRA) 180 MG tablet      finasteride   (PROSCAR ) 5 MG tablet Take 5 mg by mouth daily.     Ibuprofen  200 MG CAPS      lenalidomide  (REVLIMID ) 10 MG capsule Take 1 capsule (10 mg total) by mouth daily. Take 1 capsule daily for 21 days and then none for 7 days. 21 capsule 0   lisinopril  (ZESTRIL ) 10 MG tablet TAKE 1 TABLET EVERY DAY 90 tablet 3   Magnesium  Oxide 400 MG CAPS      melatonin 5 MG TABS      vitamin B-12 (CYANOCOBALAMIN ) 1000 MCG tablet Take 1,000 mcg by mouth daily.     fluticasone  (FLONASE ) 50 MCG/ACT nasal spray Place into the nose. (Patient not taking: Reported on 06/20/2024)     Omega-3 Fatty Acids (FISH OIL) 1200 MG CAPS Take 1,200 mg by mouth daily. (Patient not taking: Reported on 06/20/2024)     No current facility-administered medications for this visit.     Objective:  BP 112/60 (BP Location: Left Arm, Patient Position: Sitting, Cuff Size: Normal)   Pulse 94   Temp 98.4 F (36.9 C) (Temporal)   Ht 5' 9 (1.753 m)   Wt 193 lb 3.2 oz (87.6 kg)   SpO2 96%   BMI 28.53 kg/m  Gen: NAD, resting comfortably CV: RRR no murmurs rubs or gallops Lungs: CTAB no crackles, wheeze, rhonchi  Ext: no edema Skin: warm, dry    Assessment and Plan    #hypertension with white coat element S: medication:  lisinopril  40 mg--> 10 mg.  I had recommended cutting dose in half for a day or 2 and then cutting out completely yesterday evening Home readings #s: Patient stopped drinking alcohol on Monday-he has noted a drop in his blood pressure since that time. Had bene about 2 glasses of wine- just wanted to experiment -Cardiology about a week ago had ordered a stress test due to ongoing fatigue-of note patient with ongoing maintenance treatment with Revlimid  and has follow-up visits every 3 months with oncology - day before yesterday out at lunch for 2-2.5 hours and longer he was there felt worse and worse. No chest pain or shortness of breath .  -Denies any dizziness or fatigue today BP Readings from Last 3 Encounters:   06/20/24 112/60  06/13/24 121/74  06/11/24 114/75  A/P: Hypertension appears overcontrolled recently he's feeling better already and pressure is coming up some but id still like to cut his lisinopril  in half to 5 mg and have him update me in about a week- hoping his symptom(s) resolve of the fatigue and dizziness completely In this time- if not and blood pressure low . I prefer blood pressure between 110-135 probably- don't really want it under that  -We discussed he could cut medication out completely if does not respond well enough to the reduction to 5 mg-if he does well on this dose we can send him a long-term prescription  # fatty liver S: Previously we had discussed limiting alcohol-as above he has decided to trial off -He is also down 4 pounds from last visit Lab Results  Component Value Date   ALT 28 06/11/2024   AST 22 06/11/2024   ALKPHOS 70 06/11/2024   BILITOT 1.3 (H) 06/11/2024  A/P: Liver function test have been reassuring.  Reducing alcohol should reduce his risks as well plus he has lost some weight-excellent progress-continue to monitor LFTs every 6 to 12 months at least  Recommended follow up: No follow-ups on file. Future Appointments  Date Time Provider Department Center  06/26/2024  2:00 PM [name], AUD [facility] None  07/07/2024  3:15 PM [name], DPM [facility]  07/11/2024 12:00 PM [facility] None  07/11/2024 12:45 PM [facility] None  08/08/2024  9:45 AM [facility] None  08/08/2024 10:20 AM [name], PA-C [facility] None  08/08/2024 11:00 AM [facility] FL[facility] None  11/17/2024  1:00 PM [name], MD [facility]    Lab/Order associations: No diagnosis found.  No orders of the defined types were placed in this encounter.   Return precautions advised.  [name], MD

## 2024-06-20 NOTE — Telephone Encounter (Signed)
 Left detailed vm for patient per dpr regarding provider remarks and recommendations.

## 2024-06-23 ENCOUNTER — Ambulatory Visit (HOSPITAL_COMMUNITY)

## 2024-06-26 ENCOUNTER — Ambulatory Visit: Attending: Internal Medicine | Admitting: Audiologist

## 2024-06-26 DIAGNOSIS — H906 Mixed conductive and sensorineural hearing loss, bilateral: Secondary | ICD-10-CM | POA: Diagnosis not present

## 2024-06-26 DIAGNOSIS — H918X3 Other specified hearing loss, bilateral: Secondary | ICD-10-CM | POA: Insufficient documentation

## 2024-06-26 DIAGNOSIS — D333 Benign neoplasm of cranial nerves: Secondary | ICD-10-CM | POA: Insufficient documentation

## 2024-06-26 NOTE — Procedures (Signed)
  Outpatient Audiology and [facility] [address] [place], [place]  [address] [phone]  AUDIOLOGICAL  EVALUATION  NAME: Wesley [name]     DOB:   Dec 29, 1940      MRN: [id number]                                                                                     DATE: 06/26/2024     REFERENT: [name], MD STATUS: Outpatient DIAGNOSIS: Asymmetric Mixed Hearing Loss    History: Obi was seen for an audiological evaluation due to a known asymmetric hearing loss with diagnosed schwannoma. Huriel previously was seen with [facility]. He has not had a hearing test in the last year. He does not feel his hearing has progressed. He would not wear hearing aids.   The note from 2022 states The hearing evaluation revealed 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 In the right ear, mild loss sloped to severe loss with the exception of WNL hearing sensitivity for 1000 and 2000 Hz. Slight mixed component in the low frequencies. In the left ear, thresholds were WNL from 250- 3000 Hz, then mild to moderate loss for 4000, 6000, and 8000 Hz. Results were obtained using headphones then inserts.     Evaluation:  Otoscopy showed a clear view of the tympanic membranes, bilaterally Tympanometry results were consistent with normal middle ear function with slight hypercompliance in the right ear Audiometric testing was completed using Conventional Audiometry techniques with insert earphones and supraural headphones. Test results are consistent with audiogram below. Speech Recognition Thresholds were obtained at 45dB HL in the right ear and at 15dB HL in the left ear. Word Recognition Testing was completed at  40dB SL and Nima scored 84% in the right ear and 100% in the left ear.    Results:  The test results were reviewed with Vinie. The previous audiogram is not available for comparison. The hearing loss has significantly progressed bilaterally voer the last three  years. Ladanian was counseled on the nature and degree of the hearing loss.   Audiogram printed and provided to [facility].     Recommendations: Hearing aids recommended for both ears. Patient declined. Still provided a list of local ENT and hearing aid providers in case he changes his mind.  Otolaryngology referral for follow up declined.  Annual audiometric testing recommended to monitor hearing loss for further progression. Return sooner if new concerns arise.  28 minutes spent testing and counseling on results.   If you have any questions please feel free to contact me at [phone].  Lauraine Ka Stalnaker Au.D.  Audiologist   06/26/2024  2:43 PM  Cc: [name], MD

## 2024-06-27 ENCOUNTER — Encounter: Payer: Self-pay | Admitting: Hematology and Oncology

## 2024-07-02 DIAGNOSIS — C44321 Squamous cell carcinoma of skin of nose: Secondary | ICD-10-CM | POA: Diagnosis not present

## 2024-07-02 DIAGNOSIS — Z85828 Personal history of other malignant neoplasm of skin: Secondary | ICD-10-CM | POA: Diagnosis not present

## 2024-07-04 ENCOUNTER — Inpatient Hospital Stay

## 2024-07-06 DIAGNOSIS — Z23 Encounter for immunization: Secondary | ICD-10-CM | POA: Diagnosis not present

## 2024-07-07 ENCOUNTER — Other Ambulatory Visit: Payer: Self-pay | Admitting: Hematology and Oncology

## 2024-07-07 ENCOUNTER — Encounter: Payer: Self-pay | Admitting: Podiatry

## 2024-07-07 ENCOUNTER — Ambulatory Visit (INDEPENDENT_AMBULATORY_CARE_PROVIDER_SITE_OTHER): Admitting: Podiatry

## 2024-07-07 DIAGNOSIS — B351 Tinea unguium: Secondary | ICD-10-CM

## 2024-07-07 DIAGNOSIS — M79675 Pain in left toe(s): Secondary | ICD-10-CM

## 2024-07-07 DIAGNOSIS — M79674 Pain in right toe(s): Secondary | ICD-10-CM

## 2024-07-07 DIAGNOSIS — G620 Drug-induced polyneuropathy: Secondary | ICD-10-CM | POA: Diagnosis not present

## 2024-07-07 DIAGNOSIS — T451X5A Adverse effect of antineoplastic and immunosuppressive drugs, initial encounter: Secondary | ICD-10-CM | POA: Diagnosis not present

## 2024-07-07 DIAGNOSIS — C9000 Multiple myeloma not having achieved remission: Secondary | ICD-10-CM

## 2024-07-07 NOTE — Progress Notes (Signed)
This patient presents to the office with chief complaint of long thick painful nails.  Patient says the nails are painful walking and wearing shoes.  This patient is unable to self treat.  This patient is unable to trim his nails since he is unable to reach his nails.  he presents to the office for preventative foot care services.  General Appearance  Alert, conversant and in no acute stress.  Vascular  Dorsalis pedis and posterior tibial  pulses are palpable  bilaterally.  Capillary return is within normal limits  bilaterally. Temperature is within normal limits  bilaterally.  Neurologic  Senn-Weinstein monofilament wire test within normal limits  bilaterally. Muscle power within normal limits bilaterally.  Nails Thick disfigured discolored nails with subungual debris  from hallux to fifth toes bilaterally. No evidence of bacterial infection or drainage bilaterally.  Orthopedic  No limitations of motion  feet .  No crepitus or effusions noted.  No bony pathology or digital deformities noted.  Hammer toes  B/L 2-5  Skin  normotropic skin with no porokeratosis noted bilaterally.  No signs of infections or ulcers noted.   Asymptomatic clavi  B/L.  Onychomycosis  Nails  B/L.  Pain in right toes  Pain in left toes  Debridement of nails both feet followed trimming the nails with dremel tool.    RTC 3 months.   Helane Gunther DPM

## 2024-07-08 ENCOUNTER — Encounter: Payer: Self-pay | Admitting: Hematology and Oncology

## 2024-07-08 ENCOUNTER — Ambulatory Visit: Payer: Self-pay | Admitting: Family Medicine

## 2024-07-08 ENCOUNTER — Inpatient Hospital Stay

## 2024-07-08 ENCOUNTER — Inpatient Hospital Stay: Attending: Hematology and Oncology

## 2024-07-08 VITALS — BP 131/75 | HR 61 | Resp 16

## 2024-07-08 DIAGNOSIS — E538 Deficiency of other specified B group vitamins: Secondary | ICD-10-CM

## 2024-07-08 DIAGNOSIS — C9000 Multiple myeloma not having achieved remission: Secondary | ICD-10-CM | POA: Insufficient documentation

## 2024-07-08 DIAGNOSIS — Z79899 Other long term (current) drug therapy: Secondary | ICD-10-CM | POA: Insufficient documentation

## 2024-07-08 DIAGNOSIS — C9001 Multiple myeloma in remission: Secondary | ICD-10-CM

## 2024-07-08 LAB — CBC WITH DIFFERENTIAL (CANCER CENTER ONLY)
Abs Immature Granulocytes: 0.02 K/uL (ref 0.00–0.07)
Basophils Absolute: 0.1 K/uL (ref 0.0–0.1)
Basophils Relative: 2 %
Eosinophils Absolute: 0.4 K/uL (ref 0.0–0.5)
Eosinophils Relative: 10 %
HCT: 43 % (ref 39.0–52.0)
Hemoglobin: 15 g/dL (ref 13.0–17.0)
Immature Granulocytes: 0 %
Lymphocytes Relative: 21 %
Lymphs Abs: 1 K/uL (ref 0.7–4.0)
MCH: 35.5 pg — ABNORMAL HIGH (ref 26.0–34.0)
MCHC: 34.9 g/dL (ref 30.0–36.0)
MCV: 101.7 fL — ABNORMAL HIGH (ref 80.0–100.0)
Monocytes Absolute: 0.8 K/uL (ref 0.1–1.0)
Monocytes Relative: 17 %
Neutro Abs: 2.3 K/uL (ref 1.7–7.7)
Neutrophils Relative %: 50 %
Platelet Count: 141 K/uL — ABNORMAL LOW (ref 150–400)
RBC: 4.23 MIL/uL (ref 4.22–5.81)
RDW: 14.6 % (ref 11.5–15.5)
WBC Count: 4.6 K/uL (ref 4.0–10.5)
nRBC: 0 % (ref 0.0–0.2)

## 2024-07-08 LAB — CMP (CANCER CENTER ONLY)
ALT: 37 U/L (ref 0–44)
AST: 33 U/L (ref 15–41)
Albumin: 4 g/dL (ref 3.5–5.0)
Alkaline Phosphatase: 94 U/L (ref 38–126)
Anion gap: 9 (ref 5–15)
BUN: 16 mg/dL (ref 8–23)
CO2: 26 mmol/L (ref 22–32)
Calcium: 9.6 mg/dL (ref 8.9–10.3)
Chloride: 103 mmol/L (ref 98–111)
Creatinine: 1.04 mg/dL (ref 0.61–1.24)
GFR, Estimated: >60 mL/min (ref >60)
Glucose, Bld: 112 mg/dL — ABNORMAL HIGH (ref 70–99)
Potassium: 4.3 mmol/L (ref 3.5–5.1)
Sodium: 138 mmol/L (ref 135–145)
Total Bilirubin: 1.1 mg/dL (ref 0.0–1.2)
Total Protein: 6.7 g/dL (ref 6.5–8.1)

## 2024-07-08 LAB — VITAMIN B12: Vitamin B-12: 316 pg/mL (ref 180–914)

## 2024-07-08 LAB — LACTATE DEHYDROGENASE: LDH: 157 U/L (ref 105–235)

## 2024-07-08 MED ORDER — DENOSUMAB 120 MG/1.7ML ~~LOC~~ SOLN
120.0000 mg | Freq: Once | SUBCUTANEOUS | Status: AC
  Administered 2024-07-08: 120 mg via SUBCUTANEOUS
  Filled 2024-07-08: qty 1.7

## 2024-07-08 NOTE — Telephone Encounter (Signed)
 OV note 05/07/24: Plan:  --Currently on maintenance revlimid  10 mg PO daily 21 of 28 days.

## 2024-07-09 LAB — KAPPA/LAMBDA LIGHT CHAINS
Kappa free light chain: 23 mg/L — ABNORMAL HIGH (ref 3.3–19.4)
Kappa, lambda light chain ratio: 1.34 (ref 0.26–1.65)
Lambda free light chains: 17.2 mg/L (ref 5.7–26.3)

## 2024-07-11 ENCOUNTER — Inpatient Hospital Stay

## 2024-07-11 LAB — MULTIPLE MYELOMA PANEL, SERUM
Albumin SerPl Elph-Mcnc: 3.3 g/dL (ref 2.9–4.4)
Albumin/Glob SerPl: 1.2 (ref 0.7–1.7)
Alpha 1: 0.3 g/dL (ref 0.0–0.4)
Alpha2 Glob SerPl Elph-Mcnc: 0.8 g/dL (ref 0.4–1.0)
B-Globulin SerPl Elph-Mcnc: 1 g/dL (ref 0.7–1.3)
Gamma Glob SerPl Elph-Mcnc: 0.8 g/dL (ref 0.4–1.8)
Globulin, Total: 2.9 g/dL (ref 2.2–3.9)
IgA: 258 mg/dL (ref 61–437)
IgG (Immunoglobin G), Serum: 817 mg/dL (ref 603–1613)
IgM (Immunoglobulin M), Srm: 48 mg/dL (ref 15–143)
Total Protein ELP: 6.2 g/dL (ref 6.0–8.5)

## 2024-07-14 ENCOUNTER — Telehealth: Payer: Self-pay | Admitting: Family Medicine

## 2024-07-14 NOTE — Telephone Encounter (Signed)
 LVM to schedule weekly B12 injections for 4 weeks.

## 2024-07-23 DIAGNOSIS — D0339 Melanoma in situ of other parts of face: Secondary | ICD-10-CM | POA: Diagnosis not present

## 2024-07-23 DIAGNOSIS — Z85828 Personal history of other malignant neoplasm of skin: Secondary | ICD-10-CM | POA: Diagnosis not present

## 2024-07-30 ENCOUNTER — Other Ambulatory Visit: Payer: Self-pay | Admitting: Family Medicine

## 2024-07-31 ENCOUNTER — Telehealth: Payer: Self-pay | Admitting: Physician Assistant

## 2024-07-31 NOTE — Telephone Encounter (Signed)
 I spoke with patient to reschedule appointments from 08/08/2024 to 08/27/2024 due to patient traveling. Patient is aware of date/time change.

## 2024-08-01 ENCOUNTER — Inpatient Hospital Stay

## 2024-08-01 ENCOUNTER — Inpatient Hospital Stay: Admitting: Hematology and Oncology

## 2024-08-08 ENCOUNTER — Inpatient Hospital Stay

## 2024-08-08 ENCOUNTER — Inpatient Hospital Stay: Admitting: Physician Assistant

## 2024-08-11 ENCOUNTER — Other Ambulatory Visit: Payer: Self-pay | Admitting: Hematology and Oncology

## 2024-08-11 DIAGNOSIS — C9000 Multiple myeloma not having achieved remission: Secondary | ICD-10-CM

## 2024-08-13 ENCOUNTER — Other Ambulatory Visit: Payer: Self-pay | Admitting: *Deleted

## 2024-08-13 DIAGNOSIS — C9000 Multiple myeloma not having achieved remission: Secondary | ICD-10-CM

## 2024-08-13 MED ORDER — LENALIDOMIDE 10 MG PO CAPS: 10.0000 mg | ORAL_CAPSULE | Freq: Every day | ORAL | 0 refills | Status: DC

## 2024-08-20 ENCOUNTER — Encounter: Payer: Self-pay | Admitting: Hematology and Oncology

## 2024-08-22 ENCOUNTER — Telehealth: Payer: Self-pay | Admitting: Hematology and Oncology

## 2024-08-22 NOTE — Telephone Encounter (Signed)
 The patient called and requested a later time of day. The patient opted to move his appointments to a later date that better fit his schedule. The patient is aware of all changes made.

## 2024-08-27 ENCOUNTER — Inpatient Hospital Stay

## 2024-08-27 ENCOUNTER — Inpatient Hospital Stay: Admitting: Physician Assistant

## 2024-09-02 ENCOUNTER — Other Ambulatory Visit: Payer: Self-pay | Admitting: Physician Assistant

## 2024-09-02 DIAGNOSIS — C9001 Multiple myeloma in remission: Secondary | ICD-10-CM

## 2024-09-02 NOTE — Progress Notes (Unsigned)
 " [facility] [phone]   [phone]  PROGRESS NOTE  Patient Care Team: [name], MD as PCP - General (Family Medicine) Jeffrie Oneil BROCKS, MD as PCP - Cardiology (Cardiology) Rubin Calamity, MD as Consulting Physician (General Surgery) Joshua Blamer, MD as Attending Physician (Dermatology) Buckley Zachary K, MD as Consulting Physician (Psychiatry) [name] Norleen ONEIDA MADISON, MD as Consulting Physician (Hematology and Oncology) Izell Domino, MD as Consulting Physician (Radiation Oncology) Malmfelt, Delon CROME, RN as Oncology Nurse Navigator  Hematological/Oncological History # 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 Further workup showed SPEP M-Spike 4.2 g/dL with IgG-Kappa as monoclonal protein, IgG 5908 mg/dL 8/73/7976: Initial evaluation at [facility]. bone marrow biopsy performed, showed 60% abnormal plasma cells in 60% cellular marrow. Labs showed kappa 35.28 mg/dL, lambda 9.22 mg/dL, K/L ratio 54.17 and M-Spike 4.48 g/dL, IgG kappa monoclonal gammopathy 09/15/2021: CT skeletal survey showed no CT evidence of aggressive osseous lesions.  09/26/2021: transfer care to Dr. [name] at [facility].  09/30/2021: Cycle 1 Day 1 of Dara/Dex 10/06/2021: Cycle 1 Day 8 (added revlimid ) 10/28/2021: Cycle 2 Day 1 of Dara/Rev/Dex 11/25/2021: Cycle 3 Day 1 of Dara/Rev/Dex 5/12/023: Cycle 4 Day 1 of Dara/Rev/Dex 01/20/2022: Cycle 5 Day 1 of Dara/Rev/Dex 02/17/2022: Cycle 6 Day 1 of Dara/Rev/Dex 03/17/2022: Cycle 7 Day 1 of Dara/Rev/Dex 04/14/2022: Cycle 8 Day 1 of Dara/Rev/Dex 05/22/2022: Cycle 9 Day 1 of Dara/Rev/Dex 06/19/2022: Cycle 10 Day 1 of Dara/Rev/Dex 07/17/2022: Cycle 11 Day 1 of Dara/Rev/Dex 08/17/2022: start maintenance revlimid  10 mg PO daily 21 of 28 days.   Interval History:  Wesley [name] 84 y.o. male with medical history significant for IgG kappa multiple myeloma who  presents for a follow up visit. The patient's last visit was on 05/07/2024. In the interim since the last visit he continued treatment with maintenance revlimid .   On exam today Wesley [name] reports***  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)    CAD (coronary artery disease)    Cholelithiasis    Chronic allergic rhinitis    Coronary atherosclerosis of native coronary artery    Fatty liver    GERD (gastroesophageal reflux disease)    HX 51yrs ago, no longer a problem, no meds   High blood pressure    Hypercholesterolemia    Macrocytosis without anemia    Multiple myeloma not having achieved remission (HCC) 09/26/2021   OSA on CPAP    Paraesophageal hernia    Partial bowel obstruction (HCC)    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 PT denies significant findings   CARDIAC CATHETERIZATION  2008   COLOSTOMY N/A 10/31/2019   Procedure: COLOSTOMY;  Surgeon: Vanderbilt Ned, MD;  Location: [facility];  Service: General;  Laterality: N/A;   CYSTOSCOPY W/ URETERAL STENT PLACEMENT Bilateral 10/31/2019   Procedure: CYSTOSCOPY URETERAL STENT PLACEMENT BILATERAL;  Surgeon: Matilda Garnette, MD;  Location: [facility];  Service: Urology;  Laterality: Bilateral;   CYSTOSCOPY WITH STENT PLACEMENT Bilateral 03/11/2020   Procedure:  CYSTOSCOPY WITH BILATERAL FIREFLY INJECTION;  Surgeon: Watt Rush, MD;  Location: [facility];  Service: Urology;  Laterality: Bilateral;   EYE SURGERY     BILATERAL CATARACT SURGERY WITH LENS IMPLANTS   FLEXIBLE SIGMOIDOSCOPY N/A 10/29/2019   Procedure: FLEXIBLE  SIGMOIDOSCOPY;  Surgeon: Albertus Gordy HERO, MD;  Location: [facility];  Service: Gastroenterology;  Laterality: N/A;   INCISIONAL HERNIA REPAIR N/A 10/13/2020   Procedure: OPEN INCISIONAL HERNIA REPAIR WITH MESH;  Surgeon: Rubin Calamity, MD;  Location: [facility] OR;  Service: General;  Laterality: N/A;   INSERTION OF MESH N/A 09/08/2016   Procedure: INSERTION OF MESH;  Surgeon: Krystal Russell, MD;  Location: [facility];  Service: General;  Laterality: N/A;   IR RADIOLOGIST EVAL & MGMT  11/10/2020   IR RADIOLOGIST EVAL & MGMT  11/25/2020   LAPAROSCOPIC SIGMOID COLECTOMY N/A 10/31/2019   Procedure: DIAGNOSTIC LAPAROSCOPY; EXPLORATORY LAPAROTOMY; SIGMOID COLECTOMY;  Surgeon: Vanderbilt Ned, MD;  Location: [facility];  Service: General;  Laterality: N/A;   right rotator cuff     SUBMUCOSAL TATTOO INJECTION  10/29/2019   Procedure: SUBMUCOSAL TATTOO INJECTION;  Surgeon: Albertus Gordy HERO, MD;  Location: [facility];  Service: Gastroenterology;;   TONSILLECTOMY     UMBILICAL HERNIA REPAIR N/A 09/08/2016   Procedure: OPEN UMBILICAL HERNIA REPAIR WITH MESH;  Surgeon: Krystal Russell, MD;  Location: [facility];  Service: General;  Laterality: N/A;   VENTRAL HERNIA REPAIR N/A 02/03/2021   Procedure: LAPAROSCOPIC VENTRAL HERNIA REPAIR WITH MESH;  Surgeon: Rubin Calamity, MD;  Location[facility] OR;  Service: General;  Laterality: N/A;   XI ROBOTIC ASSISTED COLOSTOMY TAKEDOWN N/A 03/11/2020   Procedure: ROBOTIC ASSISTED COLOSTOMY REVERSAL, RIGID PROCTOSCOPY;  Surgeon: Ned Hila, MD;  Location: [facility];  Service: General;  Laterality: N/A;    SOCIAL HISTORY: Social History   Socioeconomic History   Marital status: Married    Spouse name: Mays Paino    Number of children: 2   Years of education: 12   Highest education level: Master's degree (e.g., MA, MS, Wesley, MEd, MSW, MBA)  Occupational History   Occupation: Retired   Tobacco Use   Smoking status: Never   Smokeless tobacco: Never  Vaping Use   Vaping status: Never  Used  Substance and Sexual Activity   Alcohol use: Yes    Alcohol/week: 17.0 standard drinks of alcohol    Types: 3 Glasses of wine, 14 Standard drinks or equivalent per week   Drug use: No   Sexual activity: Not Currently  Other Topics Concern   Not on file  Social History Narrative   Married. 2 children 52 in 48 in 2022- both went to [employer]. 5 grandkids- all girls from oldest 64 looking premed [employer], 56 soph [employer], 2 12 year olds, 10 year olds.       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 [employer]: fishing, [place] football and basketball, [place] [employer], yardwork   Social Drivers of Health   Tobacco Use: Low Risk (07/07/2024)   Patient History    Smoking Tobacco Use: Never    Smokeless Tobacco Use: Never    Passive Exposure: Not on file  Financial Resource Strain: Low Risk (06/19/2024)   Overall Financial Resource Strain (CARDIA)    Difficulty of Paying Living Expenses: Not hard at all  Food Insecurity: No Food Insecurity (06/19/2024)   Epic  Worried About Programme Researcher, Broadcasting/film/video in the Last Year: Never true    Ran Out of Food in the Last Year: Never true  Transportation Needs: No Transportation Needs (06/19/2024)   Epic    Lack of Transportation (Medical): No    Lack of Transportation (Non-Medical): No  Physical Activity: Insufficiently Active (06/19/2024)   Exercise Vital Sign    Days of Exercise per Week: 6 days    Minutes of Exercise per Session: 20 min  Stress: No Stress Concern Present (06/19/2024)   [employer] of Occupational Health - Occupational Stress Questionnaire    Feeling of Stress: Only a little  Social Connections: Moderately Integrated (06/19/2024)   Social Connection and Isolation Panel    Frequency of Communication with Friends and Family: Once a week    Frequency of Social Gatherings with Friends and Family: Once a week    Attends  Religious Services: More than 4 times per year    Active Member of [employer] or Organizations: Yes    Attends Banker Meetings: More than 4 times per year    Marital Status: Married  Catering Manager Violence: Not At Risk (01/10/2024)   Humiliation, Afraid, Rape, and Kick questionnaire    Fear of Current or Ex-Partner: No    Emotionally Abused: No    Physically Abused: No    Sexually Abused: No  Depression (PHQ2-9): Low Risk (06/20/2024)   Depression (PHQ2-9)    PHQ-2 Score: 3  Alcohol Screen: Medium Risk (06/19/2024)   Alcohol Screen    Last Alcohol Screening Score (AUDIT): 13  Housing: Unknown (06/19/2024)   Epic    Unable to Pay for Housing in the Last Year: No    Number of Times Moved in the Last Year: Not on file    Homeless in the Last Year: No  Utilities: Not At Risk (01/10/2024)   AHC Utilities    Threatened with loss of utilities: No  Health Literacy: Adequate Health Literacy (01/10/2024)   B1300 Health Literacy    Frequency of need for help with medical instructions: Never    FAMILY HISTORY: Family History  Problem Relation Age of Onset   Diabetes Mother    Heart failure Mother        around 56   CAD Father        20   Melanoma Brother 47   Healthy Daughter    Healthy Son    Colon cancer Neg Hx    Esophageal cancer Neg Hx    Rectal cancer Neg Hx    Stomach cancer Neg Hx     ALLERGIES:  is allergic to demerol, promethazine  hcl, and ativan  [lorazepam ].  MEDICATIONS:  Current Outpatient Medications  Medication Sig Dispense Refill   acetaminophen  (TYLENOL ) 500 MG tablet Take 500 mg by mouth every 6 (six) hours as needed.     aspirin  EC 81 MG tablet Take 81 mg by mouth daily. Swallow whole.     atorvastatin  (LIPITOR) 80 MG tablet Take 1 tablet (80 mg total) by mouth daily. 90 tablet 3   Cholecalciferol (VITAMIN D3) 25 MCG (1000 UT) CAPS Take 1,000 Units by mouth daily.     fexofenadine (ALLEGRA) 180 MG tablet      finasteride  (PROSCAR ) 5 MG tablet Take  5 mg by mouth daily.     fluticasone  (FLONASE ) 50 MCG/ACT nasal spray Place into the nose. (Patient not taking: Reported on 06/20/2024)     Ibuprofen  200 MG CAPS      lenalidomide  (  REVLIMID ) 10 MG capsule Take 1 capsule (10 mg total) by mouth daily. Auth # [id number]     Date Obtained 08/13/24 Take daily for 21 days then none for 7 days. 21 capsule 0   lisinopril  (ZESTRIL ) 10 MG tablet TAKE 1 TABLET EVERY DAY 90 tablet 3   Magnesium  Oxide 400 MG CAPS      melatonin 5 MG TABS      Omega-3 Fatty Acids (FISH OIL) 1200 MG CAPS Take 1,200 mg by mouth daily. (Patient not taking: Reported on 06/20/2024)     vitamin B-12 (CYANOCOBALAMIN ) 1000 MCG tablet Take 1,000 mcg by mouth daily.     No current facility-administered medications for this visit.    REVIEW OF SYSTEMS:   Constitutional: ( - ) fevers, ( - )  chills , ( - ) night sweats Eyes: ( - ) blurriness of vision, ( - ) double vision, ( - ) watery eyes Ears, nose, mouth, throat, and face: ( - ) mucositis, ( - ) sore throat Respiratory: ( - ) cough, ( - ) dyspnea, ( - ) wheezes Cardiovascular: ( - ) palpitation, ( - ) chest discomfort, ( - ) lower extremity swelling Gastrointestinal:  ( - ) nausea, ( - ) heartburn, ( - ) change in bowel habits Skin: ( - ) abnormal skin rashes Lymphatics: ( - ) new lymphadenopathy, ( - ) easy bruising Neurological: ( + ) numbness, ( - ) tingling, ( - ) new weaknesses Behavioral/Psych: ( - ) mood change, ( - ) new changes  All other systems were reviewed with the patient and are negative.  PHYSICAL EXAMINATION: ECOG PERFORMANCE STATUS: 0 - Asymptomatic  There were no vitals filed for this visit.       There were no vitals filed for this visit.     GENERAL: Well-appearing elderly Caucasian male, alert, no distress and comfortable SKIN: skin color, texture, turgor are normal, no rashes or significant lesions EYES: conjunctiva are pink and non-injected, sclera clear LUNGS: clear to auscultation and  percussion with normal breathing effort HEART: regular rate & rhythm and no murmurs and no lower extremity edema Musculoskeletal: no cyanosis of digits and no clubbing  PSYCH: alert & oriented x 3, fluent speech NEURO: no focal motor/sensory deficits  LABORATORY DATA:  I have reviewed the data as listed    Latest Ref Rng & Units 07/08/2024    3:10 PM 06/11/2024    1:33 PM 05/07/2024   12:33 PM  CBC  WBC 4.0 - 10.5 K/uL 4.6  4.5  4.8   Hemoglobin 13.0 - 17.0 g/dL 84.9  84.4  84.4   Hematocrit 39.0 - 52.0 % 43.0  45.0  44.4   Platelets 150 - 400 K/uL 141  161  129        Latest Ref Rng & Units 07/08/2024    3:10 PM 06/11/2024    1:33 PM 05/07/2024   12:33 PM  CMP  Glucose 70 - 99 mg/dL 887  895  891   BUN 8 - 23 mg/dL 16  19  15    Creatinine 0.61 - 1.24 mg/dL 8.95  8.99  8.99   Sodium 135 - 145 mmol/L 138  138  138   Potassium 3.5 - 5.1 mmol/L 4.3  3.9  4.4   Chloride 98 - 111 mmol/L 103  107  107   CO2 22 - 32 mmol/L 26  23  25    Calcium  8.9 - 10.3 mg/dL 9.6  9.3  9.0  Total Protein 6.5 - 8.1 g/dL 6.7  6.6  6.5   Total Bilirubin 0.0 - 1.2 mg/dL 1.1  1.3  1.2   Alkaline Phos 38 - 126 U/L 94  70  70   AST 15 - 41 U/L 33  22  21   ALT 0 - 44 U/L 37  28  27     Lab Results  Component Value Date   MPROTEIN Not Observed 07/08/2024   MPROTEIN Not Observed 06/11/2024   MPROTEIN Not Observed 05/07/2024   Lab Results  Component Value Date   KPAFRELGTCHN 23.0 (H) 07/08/2024   KPAFRELGTCHN 20.6 (H) 06/11/2024   KPAFRELGTCHN 22.7 (H) 05/07/2024   LAMBDASER 17.2 07/08/2024   LAMBDASER 15.6 06/11/2024   LAMBDASER 14.4 05/07/2024   KAPLAMBRATIO 1.34 07/08/2024   KAPLAMBRATIO 1.32 06/11/2024   KAPLAMBRATIO 1.58 05/07/2024    RADIOGRAPHIC STUDIES: No results found.   ASSESSMENT & PLAN Wesley [name] is a 84 y.o. male with medical history significant for IgG kappa multiple myeloma who presents for a follow up visit.   # IgG Kappa Multiple Myeloma --Patient meets  diagnostic criteria for multiple myeloma based on the presence of 60% plasma cells in the bone marrow --Pretreatment phenotype RBC as well as type and screen ordered  --Cycle 1 Day 1of Dara/Rev/Dex started on 09/30/2021. He started without revlimid  which was added on 10/06/2021 (Cycle 1 Day 8)  --Transitioned to maintenance Revlimid  on 08/17/2022.  Plan:  --Currently on maintenance revlimid  10 mg PO daily 21 of 28 days.  --Labs today showed *** --Most recent myeloma labs from 07/08/2024 did not detect M protein with normal sFLC. 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  #Lethargy 2/2 Zometa :  --Discussed alternative which would be Xgeva  q 28 days.  -- continue monthly Xgeva .    # Boils on Abdomen-stable -- Patient underwent I&D and subsequent antibiotic treatment with no resolution.  2 lesions have developed at the site of his prior ostomy.  A new lesion was present on 06/19/2022. --Patient was seen by surgery who ordered CT scan on 01/31/2022 that showed no fluid collection in the anterior abdominal wall. Instructed patient on how to pack wounds.  --No systemic symptoms, okay to continue with systemic chemotherapy. --continue to follow with surgery.   # Right hand neuropathy:  --Suspect carpal tunnel so recommend wearing a wrist brace.  --follows with [facility], next visit Sept 2025  #Supportive Care -- chemotherapy education complete  -- port placement not required.  --ASA 81 mg p.o. daily for thromboprophylaxis on Revlimid . -- zofran  8mg  q8H PRN and compazine  10mg  PO q6H for nausea -- zometa  clearance obtained, started on 03/03/2022. Had lethargy with zometa , will be converting to Xgeva .  Continue monthly injections or Xgeva  x 2 years (July 2025).  -- no pain medication required at this time.   No orders of the defined types were placed  in this encounter.  All questions were answered. The patient knows to call the clinic with any problems, questions or concerns.  I have spent a total of 30 minutes minutes of face-to-face and non-face-to-face time, preparing to see the patient, performing a medically appropriate examination, counseling and educating the patient, ordering medications/tests, documenting clinical information in the electronic health record,  and care coordination.   Johnston Police PA-C Dept of Hematology and Oncology [facility] Phone: [phone]  09/02/2024 10:28 PM "

## 2024-09-03 ENCOUNTER — Telehealth: Payer: Self-pay

## 2024-09-03 ENCOUNTER — Inpatient Hospital Stay: Admitting: Physician Assistant

## 2024-09-03 ENCOUNTER — Inpatient Hospital Stay: Attending: Hematology and Oncology

## 2024-09-03 ENCOUNTER — Inpatient Hospital Stay

## 2024-09-03 VITALS — BP 118/74 | HR 62 | Temp 97.7°F | Resp 20 | Wt 194.6 lb

## 2024-09-03 DIAGNOSIS — C9001 Multiple myeloma in remission: Secondary | ICD-10-CM

## 2024-09-03 DIAGNOSIS — C9000 Multiple myeloma not having achieved remission: Secondary | ICD-10-CM | POA: Insufficient documentation

## 2024-09-03 DIAGNOSIS — Z79899 Other long term (current) drug therapy: Secondary | ICD-10-CM | POA: Diagnosis not present

## 2024-09-03 LAB — CMP (CANCER CENTER ONLY)
ALT: 40 U/L (ref 0–44)
AST: 43 U/L — ABNORMAL HIGH (ref 15–41)
Albumin: 3.9 g/dL (ref 3.5–5.0)
Alkaline Phosphatase: 76 U/L (ref 38–126)
Anion gap: 14 (ref 5–15)
BUN: 13 mg/dL (ref 8–23)
CO2: 21 mmol/L — ABNORMAL LOW (ref 22–32)
Calcium: 9 mg/dL (ref 8.9–10.3)
Chloride: 101 mmol/L (ref 98–111)
Creatinine: 1.05 mg/dL (ref 0.61–1.24)
GFR, Estimated: >60 mL/min
Glucose, Bld: 110 mg/dL — ABNORMAL HIGH (ref 70–99)
Potassium: 4 mmol/L (ref 3.5–5.1)
Sodium: 136 mmol/L (ref 135–145)
Total Bilirubin: 0.9 mg/dL (ref 0.0–1.2)
Total Protein: 6.5 g/dL (ref 6.5–8.1)

## 2024-09-03 LAB — CBC WITH DIFFERENTIAL (CANCER CENTER ONLY)
Abs Immature Granulocytes: 0.03 K/uL (ref 0.00–0.07)
Basophils Absolute: 0.1 K/uL (ref 0.0–0.1)
Basophils Relative: 2 %
Eosinophils Absolute: 0.4 K/uL (ref 0.0–0.5)
Eosinophils Relative: 9 %
HCT: 43.5 % (ref 39.0–52.0)
Hemoglobin: 15.2 g/dL (ref 13.0–17.0)
Immature Granulocytes: 1 %
Lymphocytes Relative: 27 %
Lymphs Abs: 1 K/uL (ref 0.7–4.0)
MCH: 35.5 pg — ABNORMAL HIGH (ref 26.0–34.0)
MCHC: 34.9 g/dL (ref 30.0–36.0)
MCV: 101.6 fL — ABNORMAL HIGH (ref 80.0–100.0)
Monocytes Absolute: 0.6 K/uL (ref 0.1–1.0)
Monocytes Relative: 15 %
Neutro Abs: 1.8 K/uL (ref 1.7–7.7)
Neutrophils Relative %: 46 %
Platelet Count: 131 K/uL — ABNORMAL LOW (ref 150–400)
RBC: 4.28 MIL/uL (ref 4.22–5.81)
RDW: 14.8 % (ref 11.5–15.5)
WBC Count: 3.9 K/uL — ABNORMAL LOW (ref 4.0–10.5)
nRBC: 0 % (ref 0.0–0.2)

## 2024-09-03 LAB — IRON AND IRON BINDING CAPACITY (CC-WL,HP ONLY)
Iron: 118 ug/dL (ref 45–182)
Saturation Ratios: 35 % (ref 17.9–39.5)
TIBC: 342 ug/dL (ref 250–450)
UIBC: 224 ug/dL

## 2024-09-03 LAB — FERRITIN: Ferritin: 195 ng/mL (ref 24–336)

## 2024-09-03 MED ORDER — DENOSUMAB 120 MG/1.7ML ~~LOC~~ SOLN
120.0000 mg | Freq: Once | SUBCUTANEOUS | Status: AC
  Administered 2024-09-03: 120 mg via SUBCUTANEOUS
  Filled 2024-09-03: qty 1.7

## 2024-09-03 NOTE — Telephone Encounter (Signed)
 LM for pt that per Johnston and Dr [name], today was his last Xgeva  inj.

## 2024-09-04 LAB — KAPPA/LAMBDA LIGHT CHAINS
Kappa free light chain: 21.6 mg/L — ABNORMAL HIGH (ref 3.3–19.4)
Kappa, lambda light chain ratio: 1.43 (ref 0.26–1.65)
Lambda free light chains: 15.1 mg/L (ref 5.7–26.3)

## 2024-09-06 LAB — MULTIPLE MYELOMA PANEL, SERUM
Albumin SerPl Elph-Mcnc: 3.4 g/dL (ref 2.9–4.4)
Albumin/Glob SerPl: 1.3 (ref 0.7–1.7)
Alpha 1: 0.2 g/dL (ref 0.0–0.4)
Alpha2 Glob SerPl Elph-Mcnc: 0.7 g/dL (ref 0.4–1.0)
B-Globulin SerPl Elph-Mcnc: 1.1 g/dL (ref 0.7–1.3)
Gamma Glob SerPl Elph-Mcnc: 0.9 g/dL (ref 0.4–1.8)
Globulin, Total: 2.8 g/dL (ref 2.2–3.9)
IgA: 333 mg/dL (ref 61–437)
IgG (Immunoglobin G), Serum: 888 mg/dL (ref 603–1613)
IgM (Immunoglobulin M), Srm: 44 mg/dL (ref 15–143)
Total Protein ELP: 6.2 g/dL (ref 6.0–8.5)

## 2024-09-08 ENCOUNTER — Other Ambulatory Visit: Payer: Self-pay | Admitting: Hematology and Oncology

## 2024-09-08 DIAGNOSIS — C9000 Multiple myeloma not having achieved remission: Secondary | ICD-10-CM

## 2024-09-09 ENCOUNTER — Encounter: Payer: Self-pay | Admitting: Hematology and Oncology

## 2024-09-10 ENCOUNTER — Other Ambulatory Visit: Payer: Self-pay | Admitting: *Deleted

## 2024-09-10 DIAGNOSIS — C9000 Multiple myeloma not having achieved remission: Secondary | ICD-10-CM

## 2024-09-10 MED ORDER — LENALIDOMIDE 10 MG PO CAPS: 10.0000 mg | ORAL_CAPSULE | Freq: Every day | ORAL | 0 refills | Status: AC

## 2024-10-01 ENCOUNTER — Inpatient Hospital Stay: Attending: Hematology and Oncology

## 2024-10-29 ENCOUNTER — Inpatient Hospital Stay: Attending: Hematology and Oncology

## 2024-11-17 ENCOUNTER — Encounter: Admitting: Family Medicine

## 2024-11-26 ENCOUNTER — Inpatient Hospital Stay: Admitting: Hematology and Oncology

## 2024-11-26 ENCOUNTER — Inpatient Hospital Stay: Attending: Hematology and Oncology

## 2025-01-22 ENCOUNTER — Ambulatory Visit
